# Patient Record
Sex: Female | Born: 1955 | Race: Black or African American | Hispanic: No | Marital: Single | Smoking: Never smoker
Health system: Southern US, Community
[De-identification: ages and names within clinical notes are randomized; demographics above are authoritative.]

## PROBLEM LIST (undated history)

## (undated) DIAGNOSIS — Q752 Hypertelorism: Secondary | ICD-10-CM

## (undated) DIAGNOSIS — M1990 Unspecified osteoarthritis, unspecified site: Secondary | ICD-10-CM

## (undated) DIAGNOSIS — E785 Hyperlipidemia, unspecified: Secondary | ICD-10-CM

## (undated) DIAGNOSIS — D72829 Elevated white blood cell count, unspecified: Secondary | ICD-10-CM

## (undated) DIAGNOSIS — J40 Bronchitis, not specified as acute or chronic: Secondary | ICD-10-CM

## (undated) DIAGNOSIS — Z87898 Personal history of other specified conditions: Secondary | ICD-10-CM

## (undated) DIAGNOSIS — I10 Essential (primary) hypertension: Secondary | ICD-10-CM

## (undated) DIAGNOSIS — E119 Type 2 diabetes mellitus without complications: Secondary | ICD-10-CM

## (undated) DIAGNOSIS — E669 Obesity, unspecified: Secondary | ICD-10-CM

## (undated) DIAGNOSIS — R87629 Unspecified abnormal cytological findings in specimens from vagina: Secondary | ICD-10-CM

## (undated) DIAGNOSIS — E559 Vitamin D deficiency, unspecified: Secondary | ICD-10-CM

## (undated) DIAGNOSIS — G4730 Sleep apnea, unspecified: Secondary | ICD-10-CM

## (undated) DIAGNOSIS — R7303 Prediabetes: Secondary | ICD-10-CM

## (undated) DIAGNOSIS — J42 Unspecified chronic bronchitis: Secondary | ICD-10-CM

## (undated) HISTORY — DX: Unspecified osteoarthritis, unspecified site: M19.90

## (undated) HISTORY — DX: Vitamin D deficiency, unspecified: E55.9

## (undated) HISTORY — PX: LAPAROSCOPIC GASTRIC SLEEVE RESECTION: SHX5895

## (undated) HISTORY — DX: Other disorders of bilirubin metabolism: E80.6

## (undated) HISTORY — DX: Hyperlipidemia, unspecified: E78.5

## (undated) HISTORY — DX: Type 2 diabetes mellitus without complications: E11.9

## (undated) HISTORY — DX: Unspecified abnormal cytological findings in specimens from vagina: R87.629

## (undated) HISTORY — DX: Elevated white blood cell count, unspecified: D72.829

## (undated) HISTORY — DX: Hypertelorism: Q75.2

## (undated) HISTORY — DX: Personal history of other specified conditions: Z87.898

## (undated) HISTORY — DX: Essential (primary) hypertension: I10

## (undated) HISTORY — DX: Obesity, unspecified: E66.9

## (undated) HISTORY — PX: OTHER SURGICAL HISTORY: SHX169

---

## 1998-06-19 ENCOUNTER — Ambulatory Visit (HOSPITAL_COMMUNITY): Admission: RE | Admit: 1998-06-19 | Discharge: 1998-06-19 | Payer: Self-pay | Admitting: Surgery

## 1998-06-19 ENCOUNTER — Encounter: Payer: Self-pay | Admitting: Surgery

## 2002-02-09 ENCOUNTER — Other Ambulatory Visit: Admission: RE | Admit: 2002-02-09 | Discharge: 2002-02-09 | Payer: Self-pay | Admitting: Family Medicine

## 2003-02-26 ENCOUNTER — Other Ambulatory Visit: Admission: RE | Admit: 2003-02-26 | Discharge: 2003-02-26 | Payer: Self-pay | Admitting: Family Medicine

## 2003-10-11 ENCOUNTER — Other Ambulatory Visit: Admission: RE | Admit: 2003-10-11 | Discharge: 2003-10-11 | Payer: Self-pay | Admitting: Family Medicine

## 2003-11-30 ENCOUNTER — Emergency Department (HOSPITAL_COMMUNITY): Admission: EM | Admit: 2003-11-30 | Discharge: 2003-11-30 | Payer: Self-pay | Admitting: Family Medicine

## 2003-12-03 ENCOUNTER — Emergency Department (HOSPITAL_COMMUNITY): Admission: EM | Admit: 2003-12-03 | Discharge: 2003-12-03 | Payer: Self-pay | Admitting: *Deleted

## 2004-02-10 ENCOUNTER — Emergency Department (HOSPITAL_COMMUNITY): Admission: EM | Admit: 2004-02-10 | Discharge: 2004-02-10 | Payer: Self-pay | Admitting: Emergency Medicine

## 2007-01-08 ENCOUNTER — Encounter: Admission: RE | Admit: 2007-01-08 | Discharge: 2007-01-08 | Payer: Self-pay | Admitting: Orthopedic Surgery

## 2007-04-17 HISTORY — PX: KNEE ARTHROSCOPY: SHX127

## 2007-04-21 ENCOUNTER — Ambulatory Visit (HOSPITAL_BASED_OUTPATIENT_CLINIC_OR_DEPARTMENT_OTHER): Admission: RE | Admit: 2007-04-21 | Discharge: 2007-04-21 | Payer: Self-pay | Admitting: Orthopedic Surgery

## 2008-06-27 ENCOUNTER — Encounter (INDEPENDENT_AMBULATORY_CARE_PROVIDER_SITE_OTHER): Payer: Self-pay | Admitting: *Deleted

## 2008-07-03 ENCOUNTER — Ambulatory Visit: Payer: Self-pay | Admitting: *Deleted

## 2008-07-03 DIAGNOSIS — N259 Disorder resulting from impaired renal tubular function, unspecified: Secondary | ICD-10-CM | POA: Insufficient documentation

## 2008-07-03 DIAGNOSIS — R319 Hematuria, unspecified: Secondary | ICD-10-CM | POA: Insufficient documentation

## 2008-07-03 DIAGNOSIS — E785 Hyperlipidemia, unspecified: Secondary | ICD-10-CM | POA: Insufficient documentation

## 2008-07-03 DIAGNOSIS — R079 Chest pain, unspecified: Secondary | ICD-10-CM | POA: Insufficient documentation

## 2008-07-03 DIAGNOSIS — I10 Essential (primary) hypertension: Secondary | ICD-10-CM | POA: Insufficient documentation

## 2008-07-03 DIAGNOSIS — R799 Abnormal finding of blood chemistry, unspecified: Secondary | ICD-10-CM | POA: Insufficient documentation

## 2008-07-03 DIAGNOSIS — M1990 Unspecified osteoarthritis, unspecified site: Secondary | ICD-10-CM | POA: Insufficient documentation

## 2008-07-03 DIAGNOSIS — R17 Unspecified jaundice: Secondary | ICD-10-CM | POA: Insufficient documentation

## 2008-07-03 DIAGNOSIS — D72829 Elevated white blood cell count, unspecified: Secondary | ICD-10-CM | POA: Insufficient documentation

## 2008-07-31 ENCOUNTER — Encounter: Payer: Self-pay | Admitting: Cardiology

## 2008-07-31 ENCOUNTER — Ambulatory Visit: Payer: Self-pay | Admitting: Diagnostic Radiology

## 2008-07-31 ENCOUNTER — Ambulatory Visit (HOSPITAL_BASED_OUTPATIENT_CLINIC_OR_DEPARTMENT_OTHER): Admission: RE | Admit: 2008-07-31 | Discharge: 2008-07-31 | Payer: Self-pay | Admitting: *Deleted

## 2008-07-31 ENCOUNTER — Other Ambulatory Visit: Admission: RE | Admit: 2008-07-31 | Discharge: 2008-07-31 | Payer: Self-pay | Admitting: *Deleted

## 2008-07-31 ENCOUNTER — Ambulatory Visit: Payer: Self-pay | Admitting: *Deleted

## 2008-07-31 ENCOUNTER — Encounter (INDEPENDENT_AMBULATORY_CARE_PROVIDER_SITE_OTHER): Payer: Self-pay | Admitting: *Deleted

## 2008-07-31 ENCOUNTER — Ambulatory Visit: Payer: Self-pay | Admitting: Cardiology

## 2008-07-31 DIAGNOSIS — N3000 Acute cystitis without hematuria: Secondary | ICD-10-CM | POA: Insufficient documentation

## 2008-07-31 LAB — CONVERTED CEMR LAB
ALT: 19 units/L (ref 0–35)
AST: 25 units/L (ref 0–37)
Basophils Absolute: 0.1 10*3/uL (ref 0.0–0.1)
CO2: 28 meq/L (ref 19–32)
Calcium: 8.5 mg/dL (ref 8.4–10.5)
Chloride: 107 meq/L (ref 96–112)
Eosinophils Absolute: 0.1 10*3/uL (ref 0.0–0.7)
GFR calc non Af Amer: 135.64 mL/min (ref >60)
HCT: 38.3 % (ref 36.0–46.0)
Hemoglobin: 12.7 g/dL (ref 12.0–15.0)
LDL Cholesterol: 135 mg/dL — ABNORMAL HIGH (ref 0–99)
Lymphs Abs: 3.4 10*3/uL (ref 0.7–4.0)
MCHC: 33.3 g/dL (ref 30.0–36.0)
Monocytes Relative: 8 % (ref 3.0–12.0)
Neutro Abs: 5 10*3/uL (ref 1.4–7.7)
Platelets: 293 10*3/uL (ref 150.0–400.0)
RDW: 14.4 % (ref 11.5–14.6)
Sodium: 140 meq/L (ref 135–145)
TSH: 1.14 microintl units/mL (ref 0.35–5.50)
Total CHOL/HDL Ratio: 4
Total Protein: 6.8 g/dL (ref 6.0–8.3)

## 2008-08-15 ENCOUNTER — Encounter: Payer: Self-pay | Admitting: Cardiology

## 2008-08-15 ENCOUNTER — Ambulatory Visit: Payer: Self-pay

## 2008-08-20 ENCOUNTER — Encounter: Payer: Self-pay | Admitting: Internal Medicine

## 2008-08-29 ENCOUNTER — Telehealth: Payer: Self-pay | Admitting: Cardiology

## 2008-09-05 ENCOUNTER — Telehealth: Payer: Self-pay | Admitting: Internal Medicine

## 2008-09-17 ENCOUNTER — Ambulatory Visit: Payer: Self-pay | Admitting: Internal Medicine

## 2008-10-15 HISTORY — PX: TOTAL KNEE ARTHROPLASTY: SHX125

## 2008-10-25 ENCOUNTER — Inpatient Hospital Stay (HOSPITAL_COMMUNITY): Admission: RE | Admit: 2008-10-25 | Discharge: 2008-10-29 | Payer: Self-pay | Admitting: Orthopedic Surgery

## 2009-05-15 ENCOUNTER — Ambulatory Visit: Payer: Self-pay | Admitting: Internal Medicine

## 2009-05-15 LAB — CONVERTED CEMR LAB
ALT: 13 units/L (ref 0–35)
AST: 17 units/L (ref 0–37)
Albumin: 3.9 g/dL (ref 3.5–5.2)
Alkaline Phosphatase: 72 units/L (ref 39–117)
BUN: 12 mg/dL (ref 6–23)
Calcium: 9.2 mg/dL (ref 8.4–10.5)
Chloride: 105 meq/L (ref 96–112)
Cholesterol: 201 mg/dL — ABNORMAL HIGH (ref 0–200)
Creatinine, Ser: 0.8 mg/dL (ref 0.40–1.20)
HDL: 46 mg/dL (ref >39)
Indirect Bilirubin: 0.2 mg/dL (ref 0.0–0.9)
Total Protein: 7.1 g/dL (ref 6.0–8.3)
Triglycerides: 116 mg/dL (ref <150)

## 2009-05-20 ENCOUNTER — Encounter (INDEPENDENT_AMBULATORY_CARE_PROVIDER_SITE_OTHER): Payer: Self-pay | Admitting: *Deleted

## 2009-05-23 ENCOUNTER — Encounter: Payer: Self-pay | Admitting: Internal Medicine

## 2009-05-23 ENCOUNTER — Encounter (INDEPENDENT_AMBULATORY_CARE_PROVIDER_SITE_OTHER): Payer: Self-pay | Admitting: *Deleted

## 2009-05-26 ENCOUNTER — Ambulatory Visit: Payer: Self-pay | Admitting: Gastroenterology

## 2009-06-05 ENCOUNTER — Telehealth: Payer: Self-pay | Admitting: Gastroenterology

## 2009-06-19 ENCOUNTER — Ambulatory Visit: Payer: Self-pay | Admitting: Internal Medicine

## 2009-06-19 DIAGNOSIS — J069 Acute upper respiratory infection, unspecified: Secondary | ICD-10-CM | POA: Insufficient documentation

## 2009-06-19 DIAGNOSIS — R7309 Other abnormal glucose: Secondary | ICD-10-CM | POA: Insufficient documentation

## 2009-07-02 ENCOUNTER — Ambulatory Visit: Payer: Self-pay | Admitting: Gastroenterology

## 2009-07-07 ENCOUNTER — Encounter: Payer: Self-pay | Admitting: Gastroenterology

## 2009-07-07 LAB — HM COLONOSCOPY

## 2009-07-27 ENCOUNTER — Emergency Department (HOSPITAL_BASED_OUTPATIENT_CLINIC_OR_DEPARTMENT_OTHER): Admission: EM | Admit: 2009-07-27 | Discharge: 2009-07-27 | Payer: Self-pay | Admitting: Emergency Medicine

## 2009-07-27 ENCOUNTER — Ambulatory Visit: Payer: Self-pay | Admitting: Diagnostic Radiology

## 2009-08-01 ENCOUNTER — Ambulatory Visit: Payer: Self-pay | Admitting: Internal Medicine

## 2009-08-01 DIAGNOSIS — M545 Low back pain, unspecified: Secondary | ICD-10-CM | POA: Insufficient documentation

## 2009-08-01 LAB — CONVERTED CEMR LAB
Bilirubin Urine: NEGATIVE
Glucose, Urine, Semiquant: NEGATIVE
Ketones, urine, test strip: NEGATIVE
Nitrite: NEGATIVE
Urobilinogen, UA: 0.2
pH: 5

## 2009-08-02 ENCOUNTER — Encounter: Payer: Self-pay | Admitting: Internal Medicine

## 2009-08-06 ENCOUNTER — Encounter: Payer: Self-pay | Admitting: Internal Medicine

## 2009-08-15 ENCOUNTER — Ambulatory Visit: Payer: Self-pay | Admitting: Internal Medicine

## 2009-08-15 LAB — CONVERTED CEMR LAB
Specific Gravity, Urine: 1.02
Urobilinogen, UA: 0.2
pH: 6.5

## 2009-08-25 ENCOUNTER — Encounter: Payer: Self-pay | Admitting: Internal Medicine

## 2009-08-26 ENCOUNTER — Ambulatory Visit: Payer: Self-pay | Admitting: Diagnostic Radiology

## 2009-08-26 ENCOUNTER — Ambulatory Visit (HOSPITAL_BASED_OUTPATIENT_CLINIC_OR_DEPARTMENT_OTHER): Admission: RE | Admit: 2009-08-26 | Discharge: 2009-08-26 | Payer: Self-pay | Admitting: Internal Medicine

## 2009-08-26 ENCOUNTER — Encounter: Payer: Self-pay | Admitting: Internal Medicine

## 2009-09-09 ENCOUNTER — Encounter: Payer: Self-pay | Admitting: Internal Medicine

## 2009-09-16 ENCOUNTER — Telehealth: Payer: Self-pay | Admitting: Internal Medicine

## 2009-10-07 ENCOUNTER — Encounter: Payer: Self-pay | Admitting: Internal Medicine

## 2009-10-09 ENCOUNTER — Encounter: Payer: Self-pay | Admitting: Internal Medicine

## 2010-04-07 ENCOUNTER — Ambulatory Visit: Payer: Self-pay | Admitting: Internal Medicine

## 2010-04-07 DIAGNOSIS — R51 Headache: Secondary | ICD-10-CM | POA: Insufficient documentation

## 2010-04-07 DIAGNOSIS — R519 Headache, unspecified: Secondary | ICD-10-CM | POA: Insufficient documentation

## 2010-04-07 LAB — CONVERTED CEMR LAB
BUN: 8 mg/dL
CO2: 27 meq/L
CRP, High Sensitivity: 8.2 — ABNORMAL HIGH
Calcium: 9.3 mg/dL
Chloride: 105 meq/L
Cholesterol: 208 mg/dL — ABNORMAL HIGH
Creatinine, Ser: 0.67 mg/dL
Glucose, Bld: 91 mg/dL
HDL: 50 mg/dL
Hgb A1c MFr Bld: 5.9 % — ABNORMAL HIGH
LDL Cholesterol: 139 mg/dL — ABNORMAL HIGH
Potassium: 4 meq/L
Sed Rate: 17 mm/h
Sodium: 141 meq/L
Total CHOL/HDL Ratio: 4.2
Triglycerides: 97 mg/dL
VLDL: 19 mg/dL

## 2010-04-13 ENCOUNTER — Telehealth: Payer: Self-pay | Admitting: Internal Medicine

## 2010-04-13 ENCOUNTER — Encounter: Payer: Self-pay | Admitting: Internal Medicine

## 2010-04-15 ENCOUNTER — Telehealth: Payer: Self-pay | Admitting: Internal Medicine

## 2010-05-05 ENCOUNTER — Ambulatory Visit: Payer: Self-pay | Admitting: Internal Medicine

## 2010-05-08 ENCOUNTER — Encounter: Payer: Self-pay | Admitting: Internal Medicine

## 2010-05-08 DIAGNOSIS — L538 Other specified erythematous conditions: Secondary | ICD-10-CM | POA: Insufficient documentation

## 2010-05-17 DIAGNOSIS — G4730 Sleep apnea, unspecified: Secondary | ICD-10-CM

## 2010-05-17 HISTORY — DX: Sleep apnea, unspecified: G47.30

## 2010-06-11 ENCOUNTER — Telehealth: Payer: Self-pay | Admitting: Internal Medicine

## 2010-06-16 NOTE — Progress Notes (Signed)
Summary: procedure cx charge?  Phone Note Call from Patient Call back at Home Phone [phone]   Caller: Patient Call For: Dr. Christella Hartigan Reason for Call: Talk to Doctor Summary of Call: pt called today at 1:00PM to reschedule her 06/09/2009 9:30AM COL... informed pt that she could be charged because she needed to have let us know before 5:00PM yesterday... pt said she didnt call us yesterday because she "had a lot of running around to do"... pt's reason for rescheduling is that she says she can't afford the prep right now... pt rescheduled for 07/02/2009 Dr. Christella Hartigan, would you like for this pt to be charged? Initial call taken by: Vallarie Mare,  June 05, 2009 1:14 PM  Follow-up for Phone Call        do not charge since she rescheduled. Follow-up by: Rachael Fee MD,  June 05, 2009 1:22 PM  Additional Follow-up for Phone Call Additional follow up Details #1::        Patient NOT BILLED. Additional Follow-up by: Leanor Kail [facility],  June 09, 2009 9:45 AM

## 2010-06-16 NOTE — Progress Notes (Signed)
Summary: Medication Problems  Phone Note Call from Patient Call back at Home Phone [phone] Call back at Work Phone [phone]   Caller: Patient Call For: D. Thomos Lemons DO Summary of Call: patient called and left voice message stating she has been sick, and not sure if it is the combination of the Metformin and the  Percocet that she is taking for her leg pain.  call returned to patient she took Percocet last night. She states she has had nausea and vomiting since starting the metformin. Initial call taken by: Glendell Docker CMA,  Sep 16, 2009 12:02 PM  Follow-up for Phone Call        hold metformin Follow-up by: D. Thomos Lemons DO,  Sep 16, 2009 1:25 PM  Additional Follow-up for Phone Call Additional follow up Details #1::        patient advised per Dr Artist Pais instructions Additional Follow-up by: Glendell Docker CMA,  Sep 16, 2009 2:36 PM    New/Updated Medications: METFORMIN HCL 500 MG TABS (METFORMIN HCL) 1/2 by mouth two times a day x  7 days, then one by mouth two times a day (hold) ASPIR-LOW 81 MG TBEC (ASPIRIN) Take 1 tablet by mouth once a day [employer] VIT D 5000 HIGH-POTENCY 5000 UNIT CAPS (CHOLECALCIFEROL) Take 1 capsule by mouth once a day PERCOCET 5-325 MG TABS (OXYCODONE-ACETAMINOPHEN) Take 1 tablet by mouth once a day as needed

## 2010-06-16 NOTE — Letter (Signed)
Summary: [facility] Instructions  [facility]  [address] [place], [place] [address]   Phone: [phone]  Fax: [phone]       Leah Hampton    [birth year]    MRN: [id number]        Procedure Day Dorna Bloom:  Duanne Limerick  06/09/09     Arrival Time:  8:30AM     Procedure Time:  9:30AM     Location of Procedure:                    Juliann Pares  [facility] (4th Floor)                        PREPARATION FOR COLONOSCOPY WITH MOVIPREP   Starting 5 days prior to your procedure 06/04/09 do not eat nuts, seeds, popcorn, corn, beans, peas,  salads, or any raw vegetables.  Do not take any fiber supplements (e.g. Metamucil, Citrucel, and Benefiber).  THE DAY BEFORE YOUR PROCEDURE         DATE: 06/08/09  DAY: SUNDAY  1.  Drink clear liquids the entire day-NO SOLID FOOD  2.  Do not drink anything colored red or purple.  Avoid juices with pulp.  No orange juice.  3.  Drink at least 64 oz. (8 glasses) of fluid/clear liquids during the day to prevent dehydration and help the prep work efficiently.  CLEAR LIQUIDS INCLUDE: Water Jello Ice Popsicles Tea (sugar ok, no milk/cream) Powdered fruit flavored drinks Coffee (sugar ok, no milk/cream) Gatorade Juice: apple, white grape, white cranberry  Lemonade Clear bullion, consomm, broth Carbonated beverages (any kind) Strained chicken noodle soup Hard Candy                             4.  In the morning, mix first dose of MoviPrep solution:    Empty 1 Pouch A and 1 Pouch B into the disposable container    Add lukewarm drinking water to the top line of the container. Mix to dissolve    Refrigerate (mixed solution should be used within 24 hrs)  5.  Begin drinking the prep at 5:00 p.m. The MoviPrep container is divided by 4 marks.   Every 15 minutes drink the solution down to the next mark (approximately 8 oz) until the full liter is complete.   6.  Follow completed prep with 16 oz of clear liquid of your choice (Nothing  red or purple).  Continue to drink clear liquids until bedtime.  7.  Before going to bed, mix second dose of MoviPrep solution:    Empty 1 Pouch A and 1 Pouch B into the disposable container    Add lukewarm drinking water to the top line of the container. Mix to dissolve    Refrigerate  THE DAY OF YOUR PROCEDURE      DATE: 06/09/09 DAY: MONDAY  Beginning at 4:30AM (5 hours before procedure):         1. Every 15 minutes, drink the solution down to the next mark (approx 8 oz) until the full liter is complete.  2. Follow completed prep with 16 oz. of clear liquid of your choice.    3. You may drink clear liquids until 7:30AM (2 HOURS BEFORE PROCEDURE).   MEDICATION INSTRUCTIONS  Unless otherwise instructed, you should take regular prescription medications with a small sip of water   as early as possible the morning of  your procedure.       OTHER INSTRUCTIONS  You will need a responsible adult at least 53 years of age to accompany you and drive you home.   This person must remain in the waiting room during your procedure.  Wear loose fitting clothing that is easily removed.  Leave jewelry and other valuables at home.  However, you may wish to bring a book to read or  an iPod/MP3 player to listen to music as you wait for your procedure to start.  Remove all body piercing jewelry and leave at home.  Total time from sign-in until discharge is approximately 2-3 hours.  You should go home directly after your procedure and rest.  You can resume normal activities the  day after your procedure.  The day of your procedure you should not:   Drive   Make legal decisions   Operate machinery   Drink alcohol   Return to work  You will receive specific instructions about eating, activities and medications before you leave.    The above instructions have been reviewed and explained to me by  Ezra Sites RN  May 26, 2009 1:28 PM      I fully understand and can  verbalize these instructions _____________________________ Date _________

## 2010-06-16 NOTE — Letter (Signed)
Summary: Primary Care Consult Scheduled Letter  [name]  [address]   [place], [place] [address]   Phone: [phone]  Fax: [phone]      05/20/2009 MRN: [id number]  Leah Hampton [address] [place], [place]  [address]    Dear [name],      We have scheduled an appointment for you.  At the recommendation of Dr._Yoo_____________________, we have scheduled you a consult with __Dr Jacobs__________________ on __Jan 10________________ at [facility] previsit and [facility] At 9:30 for procedure _____.  Their address is_[address] third floor ________________________. The office phone number is __[phone]__________.  If this appointment day and time is not convenient for you, please feel free to call the office of the doctor you are being referred to at the number listed above and reschedule the appointment.     It is important for you to keep your scheduled appointments. We are here to make sure you are given good patient care. If you have questions or you have made changes to your appointment, please notify us at  [phone]         , ask for              .    Thank you,  Patient Care Coordinator [place] [facility]

## 2010-06-16 NOTE — Progress Notes (Signed)
Summary: Unresolved Rash   Phone Note Call from Patient Call back at [phone]   Summary of Call: patient called and left voice message stating her rash under her breast has not improved. She has tried the Lamisil and Cortisone cream with no changes. She would like to know if Dr Artist Pais would provide her a rx cream or powder to use in order to get some relief. Initial call taken by: Glendell Docker CMA,  April 15, 2010 3:14 PM  Follow-up for Phone Call        call was returned to patient she was informed Dr Artist Pais was out of the office this afternoon. She states that she has tried the Lamisil Powder and the Cortisone Cream and there has not been any improvement. She was advised that she would receive a return call Thursday with Dr Artist Pais recommendations. Patient verbalized understanding and agrees to wait on a return call.  Follow-up by: Glendell Docker CMA,  April 15, 2010 3:22 PM  Additional Follow-up for Phone Call Additional follow up Details #1::        see rx for fluconoazole if no improvement by week 2 - needs OV Additional Follow-up by: D. Thomos Lemons DO,  April 16, 2010 2:38 PM    Additional Follow-up for Phone Call Additional follow up Details #2::    call returned to patient at [phone], she has been advised per Dr Artist Pais instructions Follow-up by: Glendell Docker CMA,  April 16, 2010 2:43 PM  New/Updated Medications: FLUCONAZOLE 50 MG TABS (FLUCONAZOLE) one by mouth once daily Prescriptions: FLUCONAZOLE 50 MG TABS (FLUCONAZOLE) one by mouth once daily  #7 x 0   Entered and Authorized by:   D. Thomos Lemons DO   Signed by:   D. Thomos Lemons DO on 04/16/2010   Method used:   Electronically to        [facility] [phone]* (retail)       [address]       [place], [place]  [address]       Ph: [phone]       Fax: [phone]   RxID:   [id number]

## 2010-06-16 NOTE — Letter (Signed)
Summary: Previsit letter  [facility]  [address] [place], [place] [address]   Phone: [phone]  Fax: [phone]       05/20/2007 MRN: [id number]  Leah Hampton [address] [place], [place]  [address]  Dear [name],  Welcome to the Gastroenterology Division at [facility].    You are scheduled to see a nurse for your pre-procedure visit on  Jan. 10, 2011 at 11am on the 3rd floor at [facility], [address]  We ask that you try to arrive at our office 15 minutes prior to your appointment time to allow for check-in.  Your nurse visit will consist of discussing your medical and surgical history, your immediate family medical history, and your medications.    Please bring a complete list of all your medications or, if you prefer, bring the medication bottles and we will list them.  We will need to be aware of both prescribed and over the counter drugs.  We will need to know exact dosage information as well.  If you are on blood thinners (Coumadin, Plavix, Aggrenox, Ticlid, etc.) please call our office today/prior to your appointment, as we need to consult with your physician about holding your medication.   Please be prepared to read and sign documents such as consent forms, a financial agreement, and acknowledgement forms.  If necessary, and with your consent, a friend or relative is welcome to sit-in on the nurse visit with you.  Please bring your insurance card so that we may make a copy of it.  If your insurance requires a referral to see a specialist, please bring your referral form from your primary care physician.  No co-pay is required for this nurse visit.     If you cannot keep your appointment, please call [phone] to cancel or reschedule prior to your appointment date.  This allows [place] the opportunity to schedule an appointment for another patient in need of care.    Thank you for choosing [facility] for your medical  needs.  We appreciate the opportunity to care for you.  Please visit [place] at our website  to learn more about our practice.                     Sincerely.                                                                                                                   The Gastroenterology Division

## 2010-06-16 NOTE — Assessment & Plan Note (Signed)
Summary: sore throat/mhf   Vital Signs:  Patient profile:   55 year old female Menstrual status:  perimenopausal Weight:      268.75 pounds BMI:     48.55 O2 Sat:      99 % on Room air Temp:     98.3 degrees F oral Pulse rate:   88 / minute Pulse rhythm:   regular Resp:     18 per minute BP sitting:   124 / 70  (right arm) Cuff size:   large  Vitals Entered By: Glendell Docker CMA (June 19, 2009 1:18 PM)  O2 Flow:  Room air  Primary Care Provider:  D. Thomos Lemons DO  CC:  Sore Throat.  History of Present Illness: c/o sore throat since Monday, Theraflu with little relief.    no fever. neck is not sore. non productive cough. no sob  Reviewed prev blood work - A1c is elevated at 6.3  Allergies: 1)  ! 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 Irine Seal    Family History: Family History of Arthritis Family History Diabetes 1st degree relative Family History of  stroke Family History of  CAD 2 sisters with congestive heart failure and mother with congestive heart failure. Father had a myocardial infarction in his 45s.     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     Mouth:  no exudates and postnasal drip.   Neck:  No deformities, masses, or tenderness noted. Lungs:  normal respiratory effort, normal breath sounds, and no wheezes.   Heart:  normal rate, regular rhythm, and no gallop.     Impression & Recommendations:  Problem # 1:  URI (ICD-465.9)  Instructed on symptomatic treatment. Call if symptoms persist or worsen.   Problem # 2:  DIABETES MELLITUS, TYPE II, BORDERLINE (ICD-790.29) A1c elevated at 6.3.  Reviewed lifestyle changes.  limit carb intake to 30 grams per meal.  repeat A1c at next visit.  consider start metformin  Complete Medication List: 1)  Percocet 5-325 Mg Tabs (Oxycodone-acetaminophen) .Marland Kitchen.. 1-2 q 4-6 hours as needed for pain 2)  Moviprep 100 Gm Solr (Peg-kcl-nacl-nasulf-na asc-c) .... As per prep instructions.  Patient Instructions: 1)  Call our office if your symptoms do not  improve or gets worse.   Immunization History:  Influenza Immunization History:    Influenza:  declined (06/19/2009)   Contraindications/Deferment of Procedures/Staging:    Test/Procedure: FLU VAX    Reason for deferment: patient declined     Current Allergies (reviewed today): ! CODEINE

## 2010-06-16 NOTE — Progress Notes (Signed)
Summary: Lab Results  Phone Note Outgoing Call   Summary of Call: call pt - cholesterol levels too high.  I suggest pt start cholesterol medication.  see rx Initial call taken by: D. Thomos Lemons DO,  April 13, 2010 12:54 PM  Follow-up for Phone Call        call placed to patient at [phone],  she has been advised per Dr Artist Pais instructions Follow-up by: Glendell Docker CMA,  April 13, 2010 1:31 PM    New/Updated Medications: LOVASTATIN 20 MG TABS (LOVASTATIN) one by mouth qpm Prescriptions: LOVASTATIN 20 MG TABS (LOVASTATIN) one by mouth qpm  #30 x 2   Entered and Authorized by:   D. Thomos Lemons DO   Signed by:   D. Thomos Lemons DO on 04/13/2010   Method used:   Electronically to        [facility] [phone]* (retail)       [address]       [place], [place]  [address]       Ph: [phone]       Fax: [phone]   RxID:   [id number]

## 2010-06-16 NOTE — Miscellaneous (Signed)
Summary: [facility]  Clinical Lists Changes  Medications: Added new medication of MOVIPREP 100 GM  SOLR (PEG-KCL-NACL-NASULF-NA ASC-C) As per prep instructions. - Signed Rx of MOVIPREP 100 GM  SOLR (PEG-KCL-NACL-NASULF-NA ASC-C) As per prep instructions.;  #1 x 0;  Signed;  Entered by: Ezra Sites RN;  Authorized by: Rachael Fee MD;  Method used: Electronically to [facility] [phone]*, [address], [place], [place]  [address], Ph: [phone], Fax: [phone] Allergies: Changed allergy or adverse reaction from CODEINE to CODEINE    Prescriptions: MOVIPREP 100 GM  SOLR (PEG-KCL-NACL-NASULF-NA ASC-C) As per prep instructions.  #1 x 0   Entered by:   Ezra Sites RN   Authorized by:   Rachael Fee MD   Signed by:   Ezra Sites RN on 05/26/2009   Method used:   Electronically to        [facility] [phone]* (retail)       [address]       [place], [place]  [address]       Ph: [phone]       Fax: [phone]   RxID:   [id number]

## 2010-06-16 NOTE — Letter (Signed)
Summary: [facility]  [facility]   Imported By: Lanelle Bal 09/16/2009 13:35:01  _____________________________________________________________________  External Attachment:    Type:   Image     Comment:   External Document

## 2010-06-16 NOTE — Letter (Signed)
Summary: [facility]  [facility]   Imported By: Lanelle Bal 08/28/2009 12:59:18  _____________________________________________________________________  External Attachment:    Type:   Image     Comment:   External Document

## 2010-06-16 NOTE — Letter (Signed)
Summary: Results Letter  [facility]  [address] [place], [place] [address]   Phone: [phone]  Fax: [phone]        July 07, 2009 MRN: [id number]    Leah Hampton [address] [place], [place]  [address]    Dear Ms. TARAZON,   The polyp(s) removed during your recent procedure were proven to be adenomatous.  These are pre-cancerous polyps that may have grown into cancers if they had not been removed.  Based on current nationally recognized surveillance guidelines, I recommend that you have a repeat colonoscopy in 3 years.   We will therefore put your information in our reminder system and will contact you in 3 years to schedule a repeat procedure.  Please call if you have any questions or concerns.       Sincerely,  Rachael Fee MD  This letter has been electronically signed by your physician.  Appended Document: Results Letter Letter mailed 2.22.11.

## 2010-06-16 NOTE — Letter (Signed)
   [place] [facility] [address] [place], [place]  [address]  [place] Phone: [phone]      May 23, 2009   Leah Hampton [address] [place], [place] [address]  RE:  LAB RESULTS  Dear  [name],  The following is an interpretation of your most recent lab tests.  Please take note of any instructions provided or changes to medications that have resulted from your lab work.  ELECTROLYTES:  Good - no changes needed  KIDNEY FUNCTION TESTS:  Good - no changes needed  LIVER FUNCTION TESTS:  Good - no changes needed  LIPID PANEL:  Fair - review at your next visit Triglyceride: 116   Cholesterol: 201   LDL: 132   HDL: 46   Chol/HDL%:  4.4 Ratio  THYROID STUDIES:  Thyroid studies normal TSH: 2.461     DIABETIC STUDIES:  Fair - schedule a follow-up appointment Blood Glucose: 95   HgbA1C: 6.3       Your blood work shows you are a borderline diabetic.  I suggest you schedule a follow up appointment within 2-4 weeks.   Sincerely Yours,    Dr. Thomos Lemons

## 2010-06-16 NOTE — Assessment & Plan Note (Signed)
Summary: 2 WEEK FOLLOW UP/MHF   Vital Signs:  Patient profile:   55 year old female Menstrual status:  perimenopausal Height:      62.5 inches Weight:      267.25 pounds BMI:     48.28 O2 Sat:      98 % on Room air Temp:     98.3 degrees F oral Pulse rate:   96 / minute Pulse rhythm:   regular Resp:     20 per minute BP sitting:   126 / 70  (right arm) Cuff size:   large  Vitals Entered By: Glendell Docker CMA (August 15, 2009 3:48 PM)  O2 Flow:  Room air CC: Rm 2 - 2 Week Follow up  Comments c/o unresolved back pain, but it has improved   Primary Care Provider:  DThomos Lemons DO  CC:  Rm 2 - 2 Week Follow up .  History of Present Illness: 55 y/o  female for follow up low back pain improved but not resolved she did not proceed with CT of abd and pelvis due to cost reasons f/u u/a shows persistent microhematuria  borderline DM  II - having difficulty with diet compliance no able to exercise regularly    Allergies: 1)  ! 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 Irine Seal     Family History: Family History of Arthritis Family History Diabetes 1st degree relative Family History of  stroke Family History of  CAD 2 sisters with congestive heart failure and mother with congestive heart failure. Father had a myocardial infarction in his 80s.         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   Neck:  No deformities, masses, or tenderness noted. Lungs:  normal respiratory effort and normal breath sounds.   Heart:  normal rate, regular rhythm, and no gallop.   Abdomen:  obese, soft, non-tender, and no masses.   Extremities:  no pitting edema   Impression & Recommendations:  Problem # 1:  BACK PAIN, LUMBAR (ICD-724.2) Assessment Improved Left sided back pain is better but not resolved.  I doubt kidney stone.   I suspect lumbar strain.    Refer to PT   Her updated medication list for this problem includes:    Metaxalone 800 Mg Tabs (Metaxalone) ..... One by mouth three times a day as needed for left back pain    Tramadol Hcl 50 Mg Tabs (Tramadol hcl) ..... One by mouth two times a day prn  Problem # 2:  DIABETES MELLITUS, TYPE II, BORDERLINE (ICD-790.29)  Her updated medication list for this problem includes:    Metformin Hcl 500 Mg Tabs (Metformin hcl) .Marland Kitchen... 1/2 by mouth two times a day x  7 days, then one by mouth two times a day  Problem # 3:  HEMATURIA UNSPECIFIED (ICD-599.70)  Pt with persistent microhematuria.  refer  to urology for furthe work up  Orders: UA Dipstick w/o Micro (manual) ([address]) Urology Referral (Urology)  Complete Medication List: 1)  Metaxalone 800 Mg Tabs (Metaxalone) .... One by mouth three times a day as needed for left back pain 2)  Tramadol Hcl 50 Mg Tabs (Tramadol hcl) .... One by mouth two times a day prn 3)  Metformin Hcl 500 Mg Tabs (Metformin hcl) .... 1/2 by mouth two times a day x  7 days, then one by mouth two times a day  Patient Instructions: 1)  Please schedule a follow-up appointment in 3 months. 2)  BMP prior to visit, ICD-9:  790.29 3)  HbgA1C prior to visit, ICD-9:  790.29 4)  Schedule blood work 2-3  days before your next office visit 5)  Our office will contact you re:  referral to physical therapy Prescriptions: METFORMIN HCL 500 MG TABS (METFORMIN HCL) 1/2 by mouth two times a day x  7 days, then one by mouth two times a day  #60 x 3   Entered and Authorized by:   D. Thomos Lemons DO   Signed by:   D. Thomos Lemons DO on 08/15/2009   Method used:   Electronically to        [facility] [phone]* (retail)       [address]       [place], [place]  [address]       Ph: [phone]       Fax: [phone]   RxID:   [id number]   Current Allergies (reviewed today): ! CODEINE  Laboratory Results   Urine Tests    Routine Urinalysis   Color: yellow Appearance: Clear Glucose: negative   (Normal Range: Negative) Bilirubin: negative   (Normal Range: Negative) Ketone: negative   (Normal Range: Negative) Spec. Gravity: 1.020   (Normal Range: 1.003-1.035) Blood: trace-intact   (Normal Range: Negative) pH: 6.5   (Normal Range: 5.0-8.0) Protein: trace   (Normal Range: Negative) Urobilinogen: 0.2   (Normal Range: 0-1) Nitrite: negative   (Normal Range: Negative) Leukocyte Esterace: negative   (Normal Range: Negative)

## 2010-06-16 NOTE — Assessment & Plan Note (Signed)
Summary: hurts in back and on left side/mhf   Vital Signs:  Patient profile:   55 year old female Menstrual status:  perimenopausal Weight:      264.50 pounds BMI:     47.78 O2 Sat:      96 % on Room air Temp:     97.8 degrees F oral Pulse rate:   82 / minute Pulse rhythm:   regular Resp:     20 per minute BP sitting:   130 / 74  (right arm) Cuff size:   large  Vitals Entered By: Glendell Docker CMA (August 01, 2009 2:04 PM)  O2 Flow:  Room air CC: Rm 3-  Back and Side Pain Comments c/o right side and back pain since Sunday. She was seen in [facility] on Sunday, Had a Rx for Methacarbomol and took 2 with relief. She was seen last week Thursday at GYN and was found to have blood inher urine. They informed her she might have a kidney stone   Primary Care Provider:  D. Thomos Lemons DO  CC:  Rm 3-  Back and Side Pain.  History of Present Illness: 55 y/o AA female c/o left sided flank pain she fell at work 1 month ago at work pt described as  sharp,   intermittent worse with bending / movement no radiation severity 7 out of 10  seen by GYN - diagnosed with UTI, she had blood in her urine , rx'ed flagyl GYN questions possibility of kidney stone   Allergies: 1)  ! 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 Irine Seal    Family History: Family History of  Arthritis Family History Diabetes 1st degree relative Family History of  stroke Family History of  CAD 2 sisters with congestive heart failure and mother with congestive heart failure. Father had a myocardial infarction in his 48s.      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   Lungs:  normal respiratory effort and normal breath sounds.   Heart:  normal rate, regular rhythm, and no gallop.   Msk:  left lower lateral rib tenderness,  no vesicular rash Neurologic:  cranial nerves II-XII intact and gait normal.     Impression & Recommendations:  Problem # 1:  FLANK PAIN, LEFT (ICD-789.09) left flank pain of unclear etiology.  I suspect symptoms from musculoskeletal source.  rule out kidney stone.  check CT of abd and pelvis. use tramadol and metaxalone.  The following medications were removed from the medication list:    Percocet 5-325 Mg Tabs (Oxycodone-acetaminophen) .Marland Kitchen... 1-2 q 4-6 hours as needed for pain Her updated medication list for this problem includes:    Metaxalone 800 Mg Tabs (Metaxalone) ..... One by mouth three times a day as needed for left back pain    Tramadol Hcl 50 Mg Tabs (Tramadol hcl) ..... One by mouth two times a day prn  Orders: CT without Contrast (CT w/o contrast)  Complete Medication List: 1)  Metaxalone 800 Mg Tabs (Metaxalone) .... One by mouth three times a day as needed for left back pain 2)  Tramadol Hcl 50 Mg Tabs (Tramadol hcl) .... One by mouth two times a day prn  Other Orders: UA Dipstick w/o Micro (manual) (81191) T-Culture, Urine ([id number]) Specimen Handling ([address])  Patient Instructions: 1)  Please schedule a follow-up appointment in 2 weeks. 2)  Do not take tramadol and metaxalone together.  Staggger dosing 2-4 hrs to avoid excessive  sedation Prescriptions: TRAMADOL HCL 50 MG TABS (TRAMADOL HCL) one by mouth two times a day prn  #30 x 0   Entered and Authorized by:   D. Thomos Lemons DO   Signed by:   D. Thomos Lemons DO on 08/01/2009   Method used:   Electronically to        [facility] [phone]* (retail)       [address]       [place], [place]  [address]       Ph: [phone]       Fax: [phone]   RxID:   [id number] METAXALONE 800 MG TABS (METAXALONE) one by mouth three times a day as needed for left back pain  #21 x 0   Entered and Authorized by:   D. Thomos Lemons DO   Signed by:   D. Thomos Lemons DO on 08/01/2009   Method used:   Electronically to        [facility] [phone]* (retail)       [address]       [place], [place]  [address]       Ph: [phone]       Fax: [phone]   RxID:   [id number]   Current Allergies (reviewed today): ! CODEINE   Preventive Care Screening  Pap Smear:    Date:  07/24/2009    Results:  Done   Laboratory Results   Urine Tests    Routine Urinalysis   Color: straw Appearance: Clear Glucose: negative   (Normal Range: Negative) Bilirubin: negative   (Normal Range: Negative) Ketone: negative   (Normal Range: Negative) Spec. Gravity: 1.020   (Normal Range: 1.003-1.035) Blood: small   (Normal Range: Negative) pH: 5.0   (Normal Range: 5.0-8.0) Protein: 30   (Normal Range: Negative) Urobilinogen: 0.2   (Normal Range: 0-1) Nitrite: negative   (Normal Range: Negative) Leukocyte Esterace: negative   (Normal Range: Negative)

## 2010-06-16 NOTE — Assessment & Plan Note (Signed)
Summary: 2 month follow up/mhf   Vital Signs:  Patient profile:   55 year old female Menstrual status:  perimenopausal Height:      62.5 inches Weight:      271.50 pounds BMI:     49.04 O2 Sat:      97 % on Room air Temp:     98.4 degrees F oral Pulse rate:   83 / minute Resp:     18 per minute BP sitting:   130 / 80  (right arm) Cuff size:   Thigh  Vitals Entered By: Glendell Docker CMA (April 07, 2010 2:54 PM)  O2 Flow:  Room air CC: 2 Month Follow up  Is Patient Diabetic? No Pain Assessment Patient in pain? no        Primary Care Provider:  Dondra Spry DO  CC:  2 Month Follow up .  History of Present Illness: 55 y/o female c/o constant steady HA bitemporal no nausea, no fever denies tightness no obvious trigger not like migraine sometimes wakes up with headache  rash under left breast, use of medictated power and cortisone cream ongoing for the past week,   microhematuria - seen by urology.  workup reported negative  Preventive Screening-Counseling & Management  Alcohol-Tobacco     Smoking Status: never  Allergies: 1)  ! Codeine  Past History:  Past Medical History: OBESITY (ICD-278.00) OTHER SCREENING BREAST EXAMINATION (ICD-V76.19)  ACUTE CYSTITIS (ICD-595.0) ENCOUNTER FOR LONG-TERM USE OF OTHER MEDICATIONS (ICD-V58.69) ROUTINE GYNECOLOGICAL EXAMINATION (ICD-V72.31) HYPERBILIRUBINEMIA (ICD-782.4) HX OF CHEST PAIN (ICD-786.50) FAMILY HISTORY DIABETES 1ST DEGREE RELATIVE (ICD-V18.0) ANA POSITIVE (ICD-790.99)  HEMATURIA UNSPECIFIED (ICD-599.70) - urologic eval Dr. 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   Eyes:  pupils equal, pupils round, and pupils reactive to light.   Mouth:  pharynx pink and moist.   Lungs:  normal respiratory effort and normal breath sounds.   Heart:  normal rate, regular rhythm, and no gallop.   Extremities:  no pitting edema Neurologic:  cranial nerves II-XII intact and gait normal.   Psych:  normally interactive, good eye contact, not anxious appearing, and not depressed appearing.     Impression & Recommendations:  Problem # 1:  HEADACHE (ICD-784.0) consider HA from OSA.  pt defers home sleep study test.  probable tension.  rule out arteritis trial of muscle relaxer at bedtime   The following medications were removed from the medication list:    Percocet 5-325 Mg Tabs (Oxycodone-acetaminophen) .Marland Kitchen... Take 1 tablet by mouth once a day as needed Her updated medication list for this problem includes:    Aspir-low 81 Mg Tbec (Aspirin) .Marland Kitchen... Take 1 tablet by mouth once a day  Orders: T-Sed Rate (Automated) ([id number])  Problem # 2:  DIABETES MELLITUS, TYPE II, BORDERLINE (ICD-790.29)  The following medications were removed from the medication list:    Metformin Hcl 500 Mg Tabs (Metformin hcl) .Marland Kitchen... 1/2 by mouth two times a day x  7 days, then one by  mouth two times a day (hold)  Orders: T-Basic Metabolic Panel [id number]) T-Lipid Profile [phone]) T- Hemoglobin A1C ([phone])  Complete Medication List: 1)  Aspir-low 81 Mg Tbec (Aspirin) .... Take 1 tablet by mouth once a day 2)  [facility] Vit D 5000 High-potency 5000 Unit Caps (Cholecalciferol) .... Take 1 capsule by mouth once a day 3)  Cyclobenzaprine Hcl 5 Mg Tabs (Cyclobenzaprine hcl) .... One by mouth at bedtime as needed 4)  Lovastatin 20 Mg Tabs (Lovastatin) .... One by mouth qpm 5)  Fluconazole 50 Mg Tabs (Fluconazole) .... One  by mouth once daily  Other Orders: CRP, high sensitivity-[facility] ([id number])  Patient Instructions: 1)  Please schedule a follow-up appointment in 1 month. Prescriptions: CYCLOBENZAPRINE HCL 5 MG TABS (CYCLOBENZAPRINE HCL) one by mouth at bedtime as needed  #30 x 0   Entered and Authorized by:   D. Thomos Lemons DO   Signed by:   D. Thomos Lemons DO on 04/07/2010   Method used:   Electronically to        [facility] [phone]* (retail)       [address]       [place], [place]  [address]       Ph: [phone]       Fax: [phone]   RxID:   [id number]    Orders Added: 1)  T-Sed Rate (Automated) [[id number]] 2)  T-Basic Metabolic Panel [id number] 3)  T-Lipid Profile [80061-22930] 4)  CRP, high sensitivity-[facility] [id number] 5)  T- Hemoglobin A1C [83036-23375] 6)  Est. Patient Level III [[address]]   Immunization History:  Influenza Immunization History:    Influenza:  historical (02/24/2010)   Immunization History:  Influenza Immunization History:    Influenza:  Historical (02/24/2010)      Current Allergies (reviewed today): ! CODEINE

## 2010-06-16 NOTE — Letter (Signed)
   [place] [facility] [address] [place], [place]  [address]  [place] Phone: [phone]      April 13, 2010   Leah Hampton [address] [place], [place] [address]  RE:  LAB RESULTS  Dear  [name],  The following is an interpretation of your most recent lab tests.  Please take note of any instructions provided or changes to medications that have resulted from your lab work.  ELECTROLYTES:  Good - no changes needed  KIDNEY FUNCTION TESTS:  Good - no changes needed  LIPID PANEL:  Fair - review at your next visit Triglyceride: 97   Cholesterol: 208   LDL: 139   HDL: 50   Chol/HDL%:  4.2 Ratio   DIABETIC STUDIES:  Fair - schedule a follow-up appointment Blood Glucose: 91   HgbA1C: 5.9     Sed rate - normal       Sincerely Yours,    Dr. Thomos Lemons  Appended Document:  mailed

## 2010-06-16 NOTE — Letter (Signed)
Summary: Letter Regarding Disease Mgmt Program/[employer]  Letter Regarding Disease Mgmt Program/[employer]   Imported By: Lanelle Bal 09/03/2009 08:22:59  _____________________________________________________________________  External Attachment:    Type:   Image     Comment:   External Document

## 2010-06-16 NOTE — Consult Note (Signed)
Summary: [facility]  [facility]   Imported By: Lanelle Bal 08/29/2009 12:11:18  _____________________________________________________________________  External Attachment:    Type:   Image     Comment:   External Document

## 2010-06-16 NOTE — Procedures (Signed)
Summary: Colonoscopy  Patient: [name] Note: All result statuses are Final unless otherwise noted.  Tests: (1) Colonoscopy (COL)   COL Colonoscopy           DONE     [facility]     [address]     [place], [place]  [address]           COLONOSCOPY PROCEDURE REPORT           PATIENT:  Leah Hampton, Leah Hampton  MR#:  [id number]     BIRTHDATE:  [birth year], 53 yrs. old  GENDER:  female           ENDOSCOPIST:  Rachael Fee, MD     Referred by:  Thomos Lemons, DO           PROCEDURE DATE:  07/02/2009     PROCEDURE:  Colonoscopy with snare polypectomy     ASA CLASS:  Class II     INDICATIONS:  Routine Risk Screening           MEDICATIONS:   Fentanyl 50 mcg IV, Versed 5 mg IV           DESCRIPTION OF PROCEDURE:   After the risks benefits and     alternatives of the procedure were thoroughly explained, informed     consent was obtained.  Digital rectal exam was performed and     revealed no abnormalities.   The LB [id number] endoscope     was introduced through the anus and advanced to the cecum, which     was identified by both the appendix and ileocecal valve, without     limitations.  The quality of the prep was good, using MoviPrep.     The instrument was then slowly withdrawn as the colon was fully     examined.     <<PROCEDUREIMAGES>>           FINDINGS:  There were multiple polyps identified and removed. A     total of three polyps were removed, two were retrieved and sent to     pathology (jar 1). One was 15mm, sessile, located in ascending     colon on top of a fold, removed with snare/cautery, sent to     pathology.  One was 3mm, sessile, located in descending colon,     removed with cold snare and sent to pathology. One was 2mm,     sessile, located in sigmoid colon, removed with cold snare, not     retrieved (see image3, image6, image7, and image8).  This was     otherwise a normal examination of the colon (see image10, image1,     and image2).    Retroflexed views in the rectum revealed no     abnormalities.   The scope was then withdrawn from the patient and     the procedure completed.           COMPLICATIONS:  None           ENDOSCOPIC IMPRESSION:     1) Three polyps (one was >1cm), all removed, two were retrieved     and sent to pathology     2) Otherwise normal examination           RECOMMENDATIONS:     1) If the polyp(s) removed today are proven to be adenomatous     (pre-cancerous) polyps, you will need a colonoscopy in 3-5 years.     Otherwise you should continue  to follow colorectal cancer     screening guidelines for "routine risk" patients with a     colonoscopy in 10 years.     2) You will receive a letter within 1-2 weeks with the results     of your biopsy as well as final recommendations. Please call my     office if you have not received a letter after 3 weeks.           ______________________________     Rachael Fee, MD           n.     eSIGNED:   Rachael Fee at 07/02/2009 11:33 AM           Lyna Poser, [id number]  Note: An exclamation mark (!) indicates a result that was not dispersed into the flowsheet. Document Creation Date: 07/02/2009 11:33 AM _______________________________________________________________________  (1) Order result status: Final Collection or observation date-time: 07/02/2009 11:23 Requested date-time:  Receipt date-time:  Reported date-time:  Referring Physician:   Ordering Physician: Rob Bunting [phone]) Specimen Source:  Source: Launa Grill Order Number: [id number] Lab site:   Appended Document: Colonoscopy recall     Procedures Next Due Date:    Colonoscopy: 06/2012

## 2010-06-18 NOTE — Assessment & Plan Note (Signed)
Summary: 1 month follow up/mhf   Vital Signs:  Patient profile:   55 year old female Menstrual status:  perimenopausal Height:      62.5 inches Weight:      268.25 pounds BMI:     48.46 O2 Sat:      98 % on Room air Temp:     98.4 degrees F oral Pulse rate:   90 / minute Resp:     20 per minute BP sitting:   126 / 72  (right arm) Cuff size:   large  Vitals Entered By: Glendell Docker CMA (May 08, 2010 3:20 PM)  O2 Flow:  Room air CC: 1 Month Follow up  Is Patient Diabetic? No Pain Assessment Patient in pain? no      Comments refill on fluconazole for breast rash   Primary Care Judythe Postema:  Dondra Spry DO  CC:  1 Month Follow up .  History of Present Illness: 55 y/o AA female for f/u right breast rash improved occ itches   headaches - improved sed rate was normal  Preventive Screening-Counseling & Management  Alcohol-Tobacco     Smoking Status: never  Allergies: 1)  ! Codeine  Past History:  Past Medical History: OBESITY (ICD-278.00) OTHER SCREENING BREAST EXAMINATION (ICD-V76.19)  ACUTE CYSTITIS (ICD-595.0) ENCOUNTER FOR LONG-TERM USE OF OTHER MEDICATIONS (ICD-V58.69) ROUTINE GYNECOLOGICAL EXAMINATION (ICD-V72.31) HYPERBILIRUBINEMIA (ICD-782.4) HX OF CHEST PAIN (ICD-786.50)  FAMILY HISTORY DIABETES 1ST DEGREE RELATIVE (ICD-V18.0) ANA POSITIVE (ICD-790.99)  HEMATURIA UNSPECIFIED (ICD-599.70) - urologic eval Dr. 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 Father had a myocardial infarction in his 4s.       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   Lungs:  normal respiratory effort and normal breath sounds.   Heart:  normal rate, regular rhythm, and no gallop.   Skin:  skin under right breast - no redness   Impression & Recommendations:  Problem # 1:  INTERTRIGO, CANDIDAL (ICD-695.89) Assessment Improved Improved with fluconozole continue to keep area dry  Problem # 2:  HEADACHE (ICD-784.0) Assessment: Improved improved but still experiencing headaches arrange home sleep study epworth score - 14  Her updated medication list for this problem includes:    Aspir-low 81 Mg Tbec (Aspirin) .Marland Kitchen... Take 1 tablet by mouth once a day  Complete Medication List: 1)  Aspir-low 81 Mg Tbec (Aspirin) .... Take 1 tablet by mouth once a day 2)  [facility] Vit D 5000 High-potency 5000 Unit Caps (Cholecalciferol) .... Take 1 capsule by mouth once a day 3)  Cyclobenzaprine Hcl 5 Mg Tabs (Cyclobenzaprine hcl) .... One by mouth at bedtime as needed 4)  Lovastatin 20 Mg Tabs (Lovastatin) .... One by mouth qpm  Patient Instructions: 1)  Please schedule a follow-up appointment in 3 months. 2)  BMP prior to visit, ICD-9:  790.29 3)  Hepatic Panel prior to visit, ICD-9: 272.4 4)  Lipid Panel prior to visit, ICD-9: 272.4 5)  HbgA1C prior to visit, ICD-9: 790.29 6)  Please return for lab work one (1) week before  your next appointment.  Prescriptions: LOVASTATIN 20 MG TABS (LOVASTATIN) one by mouth qpm  #90 x 1   Entered and Authorized by:   D. Thomos Lemons DO   Signed by:   D. Thomos Lemons DO on 05/08/2010   Method used:   Electronically to        [facility] [phone]* (retail)       [address]       [place], [place]  [address]       Ph: [phone]       Fax: [phone]   RxID:   [id number]    Orders Added: 1)  Est. Patient Level III [51884]     Current Allergies (reviewed today): ! CODEINE

## 2010-06-24 NOTE — Progress Notes (Signed)
Summary: Breast rash unresolved  Phone Note Call from Patient Call back at Work Phone [phone] Call back at [phone]   Caller: Patient Call For: D. Thomos Lemons DO Summary of Call: patient called and left voice message requesting a rx for Diflucan. She states the rash has not gone away after trying several things.  Initial call taken by: Glendell Docker CMA,  June 11, 2010 9:08 AM  Follow-up for Phone Call        plz advise pt to hold lovastatin while taking fluconazole x 2 weeks.  if rash does not resolve, she will need OV Follow-up by: D. Thomos Lemons DO,  June 11, 2010 6:08 PM  Additional Follow-up for Phone Call Additional follow up Details #1::        call placed to patient at [phone], [phone], no answer, no voice mail. Call placed to patient at [phone], voice message was left for patient to return call. Additional Follow-up by: Glendell Docker CMA,  June 12, 2010 8:46 AM    Additional Follow-up for Phone Call Additional follow up Details #2::    Left message on machine to return my  call. Nicki Guadalajara Fergerson CMA Duncan Dull)  June 12, 2010 2:40 PM   Left detailed message on pt's cell re: Dr Olegario Messier instruction and to call if any questions. Nicki Guadalajara Fergerson CMA (AAMA)  June 15, 2010 10:01 AM   New/Updated Medications: FLUCONAZOLE 100 MG TABS (FLUCONAZOLE) one by mouth once daily x 2 weeks Prescriptions: FLUCONAZOLE 100 MG TABS (FLUCONAZOLE) one by mouth once daily x 2 weeks  #14 x 0   Entered and Authorized by:   D. Thomos Lemons DO   Signed by:   D. Thomos Lemons DO on 06/11/2010   Method used:   Electronically to        [facility] [phone]* (retail)       [address]       [place], [place]  [address]       Ph: [phone]       Fax: [phone]   RxID:   [id number]

## 2010-07-29 ENCOUNTER — Encounter: Payer: Self-pay | Admitting: Internal Medicine

## 2010-08-04 ENCOUNTER — Other Ambulatory Visit: Payer: Self-pay | Admitting: Internal Medicine

## 2010-08-04 DIAGNOSIS — R7309 Other abnormal glucose: Secondary | ICD-10-CM

## 2010-08-04 DIAGNOSIS — E785 Hyperlipidemia, unspecified: Secondary | ICD-10-CM

## 2010-08-05 LAB — LIPID PANEL
Cholesterol: 178 mg/dL (ref 0–200)
HDL: 54 mg/dL (ref >39)
Total CHOL/HDL Ratio: 3.3 Ratio

## 2010-08-05 LAB — HEPATIC FUNCTION PANEL
ALT: 17 U/L (ref 0–35)
AST: 19 U/L (ref 0–37)
Albumin: 4 g/dL (ref 3.5–5.2)
Total Protein: 7.3 g/dL (ref 6.0–8.3)

## 2010-08-05 LAB — HEMOGLOBIN A1C: Hgb A1c MFr Bld: 6.3 % — ABNORMAL HIGH (ref <5.7)

## 2010-08-07 NOTE — Progress Notes (Signed)
Future labs released...sp

## 2010-08-07 NOTE — Progress Notes (Signed)
Addended by: Francee Piccolo on: 08/07/2010 10:53 AM   Modules accepted: Orders

## 2010-08-10 LAB — URINE MICROSCOPIC-ADD ON

## 2010-08-10 LAB — URINALYSIS, ROUTINE W REFLEX MICROSCOPIC
Bilirubin Urine: NEGATIVE
Glucose, UA: NEGATIVE mg/dL
Ketones, ur: NEGATIVE mg/dL
Protein, ur: NEGATIVE mg/dL

## 2010-08-11 ENCOUNTER — Encounter: Payer: Self-pay | Admitting: Internal Medicine

## 2010-08-11 ENCOUNTER — Ambulatory Visit (INDEPENDENT_AMBULATORY_CARE_PROVIDER_SITE_OTHER): Payer: Managed Care, Other (non HMO) | Admitting: Internal Medicine

## 2010-08-11 DIAGNOSIS — R51 Headache: Secondary | ICD-10-CM

## 2010-08-11 DIAGNOSIS — G4733 Obstructive sleep apnea (adult) (pediatric): Secondary | ICD-10-CM

## 2010-08-11 DIAGNOSIS — E785 Hyperlipidemia, unspecified: Secondary | ICD-10-CM

## 2010-08-11 DIAGNOSIS — R7309 Other abnormal glucose: Secondary | ICD-10-CM

## 2010-08-11 MED ORDER — PRAVASTATIN SODIUM 40 MG PO TABS: 40.0000 mg | ORAL_TABLET | Freq: Every day | ORAL | Status: DC

## 2010-08-11 MED ORDER — SITAGLIPTIN PHOSPHATE 100 MG PO TABS: 100.0000 mg | ORAL_TABLET | Freq: Every day | ORAL | Status: DC

## 2010-08-11 NOTE — Assessment & Plan Note (Signed)
Epworth sleepliness scale 14 Arrange split sleep study

## 2010-08-11 NOTE — Progress Notes (Signed)
  Subjective:    Patient ID: Leah Hampton, female    DOB: 1955-11-05, 55 y.o.   MRN: [id number]  HPI 55 y/o with hx of borderline DM II and hyperlipidemia for follow up Pt tolerating lovastatin Only mild drop in LDL  A1c is slight worse.  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  HENT:  Head: Normocephalic and atraumatic.  Cardiovascular: Normal rate, regular rhythm and normal heart sounds.   Pulmonary/Chest: Effort normal and breath sounds normal. She has no wheezes. She has no rales.  Skin: Skin is warm and dry.  Psychiatric: She has a normal mood and affect. Her behavior is normal.          Assessment & Plan:

## 2010-08-11 NOTE — Assessment & Plan Note (Signed)
LDL suboptimal Change lovastatin to pravastatin

## 2010-08-11 NOTE — Assessment & Plan Note (Signed)
A1c slightly worse.  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  She defers start of losartan.

## 2010-08-11 NOTE — Patient Instructions (Signed)
Our office will contact you re:  Arranging sleep study and referral to diabetic educator

## 2010-08-18 ENCOUNTER — Telehealth: Payer: Self-pay | Admitting: *Deleted

## 2010-08-18 NOTE — Telephone Encounter (Signed)
Patient called and left voice message stating she has place on her left hand that keeps bleeding. 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 She would like to know if Dr Artist Pais could refer to have it evaluated.  Boil or callus on hand about the size of pea for the past month, she states she had it evaluated by one of the medical doctors at work, and he advised her to follow up with her primary care provider.   Patient was advised to schedule appointment for evaluation.

## 2010-08-19 ENCOUNTER — Other Ambulatory Visit: Payer: Self-pay | Admitting: Internal Medicine

## 2010-08-19 DIAGNOSIS — E785 Hyperlipidemia, unspecified: Secondary | ICD-10-CM

## 2010-08-19 DIAGNOSIS — R7309 Other abnormal glucose: Secondary | ICD-10-CM

## 2010-08-20 ENCOUNTER — Encounter: Payer: Self-pay | Admitting: Internal Medicine

## 2010-08-20 ENCOUNTER — Ambulatory Visit (INDEPENDENT_AMBULATORY_CARE_PROVIDER_SITE_OTHER): Payer: Managed Care, Other (non HMO) | Admitting: Internal Medicine

## 2010-08-20 VITALS — BP 134/80 | HR 93 | Temp 97.9°F | Resp 20 | Wt 276.0 lb

## 2010-08-20 DIAGNOSIS — L0292 Furuncle, unspecified: Secondary | ICD-10-CM

## 2010-08-20 DIAGNOSIS — L0293 Carbuncle, unspecified: Secondary | ICD-10-CM

## 2010-08-20 MED ORDER — SULFAMETHOXAZOLE-TMP DS 800-160 MG PO TABS: 1.0000 | ORAL_TABLET | Freq: Two times a day (BID) | ORAL | Status: AC

## 2010-08-20 MED ORDER — OLMESARTAN MEDOXOMIL 20 MG PO TABS: ORAL_TABLET | ORAL | Status: DC

## 2010-08-20 MED ORDER — OLMESARTAN MEDOXOMIL 20 MG PO TABS: 20.0000 mg | ORAL_TABLET | Freq: Every day | ORAL | Status: DC

## 2010-08-20 MED ORDER — OLMESARTAN MEDOXOMIL 20 MG PO TABS: 10.0000 mg | ORAL_TABLET | Freq: Every day | ORAL | Status: DC

## 2010-08-20 NOTE — Patient Instructions (Signed)
Take Benicar 20 mg 1/2 tablet once daily

## 2010-08-22 ENCOUNTER — Encounter: Payer: Self-pay | Admitting: Internal Medicine

## 2010-08-22 DIAGNOSIS — L0293 Carbuncle, unspecified: Secondary | ICD-10-CM | POA: Insufficient documentation

## 2010-08-22 NOTE — Progress Notes (Signed)
  Subjective:    Patient ID: Leah Hampton, female    DOB: [birth year], 55 y.o.   MRN: [id number]  HPI 55 y/o female c/o skin lesion on left palm.   She noticed x 2-3 weeks.  She is not sure if wart or boil. She denies purulent drainage.  Mild bleeding   Review of Systems No fever or chills  Past Medical History  Diagnosis Date  . Obesity   . Cystitis   . Hyperbilirubinemia   . History of chest pain   . Hematuria     urologice eval, Dr Lindley Magnus  . Renal insufficiency   . Leukocytosis   . Osteoarthritis   . Hypertelorism   . Hyperlipemia     History   Social History  . Marital Status: Single    Spouse Name: N/A    Number of Children: 2  . Years of Education: N/A   Occupational History  . Cook     at [employer]   Social History Main Topics  . Smoking status: Never Smoker   . Smokeless tobacco: Not on file  . Alcohol Use: No  . Drug Use: No  . Sexually Active: Not on file   Other Topics Concern  . Not on file   Social History Narrative   Regular exercise:  No    Past Surgical History  Procedure Date  . Knee arthroscopy 04/2007    left knee  . 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 Diabetes    . Arthritis    . Stroke    . Coronary artery disease    . Heart failure Sister     x 2  . Heart failure Mother   . Heart attack Father 53    Allergies  Allergen Reactions  . Codeine     REACTION: nausea and vomiting    Current Outpatient Prescriptions on File Prior to Visit  Medication Sig Dispense Refill  . aspirin 81 MG tablet Take 81 mg by mouth daily.        . Cholecalciferol (VITAMIN D3) 5000 UNITS CAPS Take 1 capsule by mouth daily.        . cyclobenzaprine (FLEXERIL) 5 MG tablet Take 5 mg by mouth at bedtime as needed.        . pravastatin (PRAVACHOL) 40 MG tablet Take 1 tablet (40 mg total) by mouth daily.  90 tablet  3  . sitaGLIPtan (JANUVIA) 100 MG tablet Take 1  tablet (100 mg total) by mouth daily.  30 tablet  3    BP 134/80  Pulse 93  Temp(Src) 97.9 F (36.6 C) (Oral)  Resp 20  Wt 276 lb (125.193 kg)  SpO2 97%  LMP 06/17/2010       Objective:   Physical Exam  Constitutional: She appears well-developed and well-nourished.  Cardiovascular: Normal rate and regular rhythm.   Pulmonary/Chest: Effort normal and breath sounds normal.  Skin:       3-4 mm raised lesion left hypothenar eminence.  Mild underlying inderated area.  Slightly oozing,  tender          Assessment & Plan:

## 2010-08-22 NOTE — Assessment & Plan Note (Signed)
I suspect left hand lesion is carbuncle. Pt advised to take bactrim as directed. Also soak in epsom salt solution bid x 3-5 days Patient advised to call office if symptoms persist or worsen. Reassess in 1 week

## 2010-08-24 LAB — BASIC METABOLIC PANEL
BUN: 7 mg/dL (ref 6–23)
CO2: 29 mEq/L (ref 19–32)
CO2: 29 mEq/L (ref 19–32)
Calcium: 8.5 mg/dL (ref 8.4–10.5)
Chloride: 104 mEq/L (ref 96–112)
Chloride: 105 mEq/L (ref 96–112)
Creatinine, Ser: 0.63 mg/dL (ref 0.4–1.2)
Creatinine, Ser: 0.7 mg/dL (ref 0.4–1.2)
GFR calc Af Amer: >60 mL/min (ref >60)
GFR calc Af Amer: >60 mL/min (ref >60)
Glucose, Bld: 156 mg/dL — ABNORMAL HIGH (ref 70–99)
Potassium: 3.7 mEq/L (ref 3.5–5.1)

## 2010-08-24 LAB — URINALYSIS, ROUTINE W REFLEX MICROSCOPIC
Bilirubin Urine: NEGATIVE
Ketones, ur: NEGATIVE mg/dL
Nitrite: NEGATIVE
Protein, ur: NEGATIVE mg/dL
pH: 5 (ref 5.0–8.0)

## 2010-08-24 LAB — CBC
HCT: 31.8 % — ABNORMAL LOW (ref 36.0–46.0)
HCT: 38.3 % (ref 36.0–46.0)
Hemoglobin: 10.8 g/dL — ABNORMAL LOW (ref 12.0–15.0)
Hemoglobin: 12.7 g/dL (ref 12.0–15.0)
MCHC: 33.7 g/dL (ref 30.0–36.0)
MCHC: 33.8 g/dL (ref 30.0–36.0)
MCV: 88.4 fL (ref 78.0–100.0)
MCV: 88.5 fL (ref 78.0–100.0)
MCV: 89.1 fL (ref 78.0–100.0)
Platelets: 228 10*3/uL (ref 150–400)
RBC: 3.42 MIL/uL — ABNORMAL LOW (ref 3.87–5.11)
RBC: 3.47 MIL/uL — ABNORMAL LOW (ref 3.87–5.11)
RBC: 3.6 MIL/uL — ABNORMAL LOW (ref 3.87–5.11)
RBC: 4.3 MIL/uL (ref 3.87–5.11)
RDW: 15.2 % (ref 11.5–15.5)
WBC: 10.3 10*3/uL (ref 4.0–10.5)
WBC: 13.4 10*3/uL — ABNORMAL HIGH (ref 4.0–10.5)

## 2010-08-24 LAB — COMPREHENSIVE METABOLIC PANEL
Albumin: 3.5 g/dL (ref 3.5–5.2)
Alkaline Phosphatase: 73 U/L (ref 39–117)
BUN: 13 mg/dL (ref 6–23)
CO2: 27 mEq/L (ref 19–32)
Chloride: 109 mEq/L (ref 96–112)
Creatinine, Ser: 0.72 mg/dL (ref 0.4–1.2)
GFR calc non Af Amer: >60 mL/min (ref >60)
Glucose, Bld: 97 mg/dL (ref 70–99)
Potassium: 4.8 mEq/L (ref 3.5–5.1)
Total Bilirubin: 0.4 mg/dL (ref 0.3–1.2)

## 2010-08-24 LAB — APTT: aPTT: 29 seconds (ref 24–37)

## 2010-08-24 LAB — URINE CULTURE: Colony Count: 30000

## 2010-08-24 LAB — DIFFERENTIAL
Basophils Absolute: 0 10*3/uL (ref 0.0–0.1)
Basophils Relative: 0 % (ref 0–1)
Lymphocytes Relative: 36 % (ref 12–46)
Monocytes Absolute: 0.9 10*3/uL (ref 0.1–1.0)
Neutro Abs: 5.6 10*3/uL (ref 1.7–7.7)
Neutrophils Relative %: 54 % (ref 43–77)

## 2010-08-24 LAB — ABO/RH: ABO/RH(D): O POS

## 2010-08-24 LAB — PROTIME-INR: Prothrombin Time: 13.4 seconds (ref 11.6–15.2)

## 2010-08-24 LAB — URINE MICROSCOPIC-ADD ON

## 2010-08-31 ENCOUNTER — Encounter: Payer: Self-pay | Admitting: Family

## 2010-08-31 ENCOUNTER — Encounter: Payer: Managed Care, Other (non HMO) | Attending: Internal Medicine

## 2010-08-31 ENCOUNTER — Ambulatory Visit (INDEPENDENT_AMBULATORY_CARE_PROVIDER_SITE_OTHER): Payer: Managed Care, Other (non HMO) | Admitting: Family

## 2010-08-31 ENCOUNTER — Other Ambulatory Visit: Payer: Self-pay | Admitting: Internal Medicine

## 2010-08-31 VITALS — BP 110/68 | HR 60 | Temp 98.1°F | Resp 16 | Ht 62.5 in | Wt 267.1 lb

## 2010-08-31 DIAGNOSIS — L0293 Carbuncle, unspecified: Secondary | ICD-10-CM

## 2010-08-31 DIAGNOSIS — Z713 Dietary counseling and surveillance: Secondary | ICD-10-CM | POA: Insufficient documentation

## 2010-08-31 DIAGNOSIS — Z1231 Encounter for screening mammogram for malignant neoplasm of breast: Secondary | ICD-10-CM

## 2010-08-31 DIAGNOSIS — L0292 Furuncle, unspecified: Secondary | ICD-10-CM

## 2010-08-31 DIAGNOSIS — E119 Type 2 diabetes mellitus without complications: Secondary | ICD-10-CM | POA: Insufficient documentation

## 2010-08-31 NOTE — Patient Instructions (Signed)
You will be contacted about your referral to dermatology. Call us if you develop increased pain, redness or swelling of the area of the left hand.

## 2010-09-02 ENCOUNTER — Telehealth: Payer: Self-pay | Admitting: Family

## 2010-09-02 NOTE — Telephone Encounter (Signed)
Spoke with patient re: c/o HA which was not addressed at her last visit.  She reports that this has improved.  She denies sinus drainage.

## 2010-09-02 NOTE — Progress Notes (Signed)
Subjective:    Patient ID: Leah Hampton, female    DOB: 19-Mar-1956, 55 y.o.   MRN: [id number]  HPI  [name] is a 55 year old female who presents today for follow up of her skin infection.  She was seen by Dr. Artist Pais on 4/5 and was treated with bactrim for suspected carbuncle on her left hand.  She reports that she has been doing the epsom salts as directed.  She notes no improvement in the appearance of the lesion.  Denies fever or drainage from lesion.   Review of Systems See HPI    Past Medical History  Diagnosis Date  . Obesity   . Cystitis   . Hyperbilirubinemia   . History of chest pain   . Hematuria     urologice eval, Dr Lindley Magnus  . Renal insufficiency   . Leukocytosis   . Osteoarthritis   . Hypertelorism   . Hyperlipemia     History   Social History  . Marital Status: Single    Spouse Name: N/A    Number of Children: 2  . Years of Education: N/A   Occupational History  . Cook     at [employer]   Social History Main Topics  . Smoking status: Never Smoker   . Smokeless tobacco: Not on file  . Alcohol Use: No  . Drug Use: No  . Sexually Active: Not on file   Other Topics Concern  . Not on file   Social History Narrative   Regular exercise:  No    Past Surgical History  Procedure Date  . Knee arthroscopy 04/2007    left knee  . 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 Diabetes    . Arthritis    . Stroke    . Coronary artery disease    . Heart failure Sister     x 2  . Heart failure Mother   . Heart attack Father 66    Allergies  Allergen Reactions  . Sulfamethoxazole-Trimethoprim (Bactrim) Itching  . Codeine     REACTION: nausea and vomiting    Current Outpatient Prescriptions on File Prior to Visit  Medication Sig Dispense Refill  . aspirin 81 MG tablet Take 81 mg by mouth daily.        . Cholecalciferol (VITAMIN D3) 5000 UNITS CAPS Take 1 capsule by mouth  daily.        . cyclobenzaprine (FLEXERIL) 5 MG tablet Take 5 mg by mouth at bedtime as needed.        Marland Kitchen olmesartan (BENICAR) 20 MG tablet Take 1/2 tablet in AM  30 tablet  0  . pravastatin (PRAVACHOL) 40 MG tablet Take 1 tablet (40 mg total) by mouth daily.  90 tablet  3  . sitaGLIPtan (JANUVIA) 100 MG tablet Take 1 tablet (100 mg total) by mouth daily.  30 tablet  3    BP 110/68  Pulse 60  Temp(Src) 98.1 F (36.7 C) (Oral)  Resp 16  Ht 5' 2.5" (1.588 m)  Wt 267 lb 1.9 oz (121.165 kg)  BMI 48.08 kg/m2  LMP 06/17/2010    Objective:   Physical Exam  Constitutional: She appears well-developed and well-nourished.  Cardiovascular: Normal rate and regular rhythm.   Pulmonary/Chest: Effort normal and breath sounds normal.  Skin:       Small round ulcerated lesion with clean red base, on  left palm. Thickened skin surrounding the margins.  No drainage, no associated erythema.           Assessment & Plan:

## 2010-09-02 NOTE — Assessment & Plan Note (Signed)
Lesion on left hand is not improved with antibiotics.  Will plan referral to dermatology for further evaluation.  Lesion may be a wart.  Pt instructed to call if her symptoms worsen.  She verbalizes understanding.

## 2010-09-07 ENCOUNTER — Telehealth: Payer: Self-pay | Admitting: *Deleted

## 2010-09-07 NOTE — Telephone Encounter (Signed)
Patient called and left voice message stating her insurance will no longer cover her Januvia and she would like to know if Dr Artist Pais would change the medication to something else

## 2010-09-07 NOTE — Telephone Encounter (Signed)
Please provide samples if possible until next office visit.  Pt can take 1/2 dose of 100 mg Please advise patient to notify office if significant change in her blood sugars

## 2010-09-08 NOTE — Telephone Encounter (Signed)
Call placed to patient at [phone], no answer. A detailed voice message was left informing patient per Dr Artist Pais instructions. She was advised to call back if any questions. Samples of Januvia left at front desk for patient pick up

## 2010-09-14 ENCOUNTER — Other Ambulatory Visit: Payer: Self-pay | Admitting: Internal Medicine

## 2010-09-14 DIAGNOSIS — Z1231 Encounter for screening mammogram for malignant neoplasm of breast: Secondary | ICD-10-CM

## 2010-09-16 ENCOUNTER — Ambulatory Visit (HOSPITAL_BASED_OUTPATIENT_CLINIC_OR_DEPARTMENT_OTHER): Payer: Managed Care, Other (non HMO)

## 2010-09-16 ENCOUNTER — Ambulatory Visit
Admission: RE | Admit: 2010-09-16 | Discharge: 2010-09-16 | Disposition: A | Discharge status: Home / Self Care | Payer: Managed Care, Other (non HMO) | Source: Ambulatory Visit | Attending: Internal Medicine | Admitting: Internal Medicine

## 2010-09-16 DIAGNOSIS — Z1231 Encounter for screening mammogram for malignant neoplasm of breast: Secondary | ICD-10-CM

## 2010-09-25 ENCOUNTER — Encounter (HOSPITAL_BASED_OUTPATIENT_CLINIC_OR_DEPARTMENT_OTHER): Payer: Managed Care, Other (non HMO)

## 2010-09-29 NOTE — Op Note (Signed)
NAME:  Leah Hampton, CAPUTO NO.:  [id number]   MEDICAL RECORD NO.:  [id number]          PATIENT TYPE:  AMB   LOCATION:  DSC                          FACILITY:  [facility]   PHYSICIAN:  Dyke Brackett, M.D.    DATE OF BIRTH:  1955/11/10   DATE OF PROCEDURE:  04/21/2007  DATE OF DISCHARGE:                               OPERATIVE REPORT   INDICATIONS:  A 55 year old female, MRI proven meniscal tearing thought  to be amenable to outpatient surgery.   PREOPERATIVE DIAGNOSES:  1. Medial and lateral meniscal tears, left knee.  2. Osteoarthritis of patellofemoral joint lateral and medial      compartment.   POSTOPERATIVE DIAGNOSES:  1. Medial and lateral meniscal tears, left knee.  2. Osteoarthritis of patellofemoral joint lateral and medial      compartment.   OPERATION:  1. Partial medial and lateral meniscectomies.  2. Debridement chondroplasty (tricompartmental).   SURGEON:  Dyke Brackett, M.D.   ANESTHESIA:  General with a knee block.   DESCRIPTION OF PROCEDURE:  The patient had inferomedial and  inferolateral portals created.  She had mild-to-moderate chondromalacia  patella which was debrided.  She had some very mild chondromalacia of  the lateral compartments, small fraying type tear of the lateral  meniscus requiring resection of about 10% of the meniscus substance.  Medial compartment was the site of a lot of the pathology with  complex  tear of the posterior horn of the meniscus requiring resection of about  30-40% of the meniscus substance.  This was also noted to have grade 3  changes, mainly on the femur.  There were some advanced grade 3 changes  on the posterior medial tibial surface underneath the meniscus tear.  Again, the posterior horn of the meniscus was by and large torn and  resected grade 3 debridement carried out, very extensive grade 3 changes  on the femoral side were noted, less severe grade 3 changes on the  tibial side.  Knee drained free of  fluid.  Portals closed with nylon,  infiltrated with Marcaine with morphine __________ 80 mg Depo-Medrol,  taken to recovery room in stable condition.      Dyke Brackett, M.D.  Electronically Signed     WDC/MEDQ  D:  04/21/2007  T:  04/21/2007  Job:  [id number]

## 2010-09-29 NOTE — Discharge Summary (Signed)
NAME:  LYNNETT, LANGLINAIS NO.:  [id number]   MEDICAL RECORD NO.:  [id number]          PATIENT TYPE:  INP   LOCATION:  5003                         FACILITY:  [facility]   PHYSICIAN:  Dyke Brackett, M.D.    DATE OF BIRTH:  1955/11/06   DATE OF ADMISSION:  10/25/2008  DATE OF DISCHARGE:  10/29/2008                               DISCHARGE SUMMARY   DIAGNOSIS FOR THIS ADMISSION:  Osteoarthritis, left knee.   PROCEDURE:  Left total knee replacement.   HISTORY OF PRESENT ILLNESS:  The patient is a 55 year old woman with end-  stage arthritis of her left knee, having failed conservative treatment,  she wishes to proceed with a total knee arthroplasty.  Therapy risks and  benefits were discussed preoperatively and she wished to proceed with  total knee arthroplasty.  Preoperative history and physical revealed her  to have a history of osteoarthritis as well as asthma.  She also has a  history of hypertension and hyperlipidemia.   FAMILY HISTORY:  Positive for arthritis, diabetes, CVA and CAD.   ALLERGIES:  CODEINE.   SOCIAL HISTORY:  Significant for no tobacco, no alcohol.  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  She is not married.  No other drug use to report.   REVIEW OF SYSTEMS:  The patient denies any recent illnesses, shortness  of breath, chest pain.   PHYSICAL EXAMINATION:  VITAL SIGNS:  Her physical examination shows  temperature 97.7, pulse of 84, respirations 14, blood pressure 128/64.  GENERAL:  She is well-developed, overly-nourished.  HEENT:  Head is normocephalic, atraumatic.  Ears:  Tympanic membranes  clear.  Eyes: Pupils equal, round, reactive to light and accommodation.  Nose and throat benign.  NECK:  Full range of motion.  CHEST:  Lungs clear to auscultation.  CARDIAC:  Regular rate and rhythm without murmur.  ABDOMEN:  Soft, nontender, nondistended with positive bowel sounds.  NEUROLOGICAL:  She is neurovascularly intact.  SKIN:  Within normal limits.  MUSCULOSKELETAL:  Exam shows left lower extremity with no obvious  injury.  Full internal and external rotation of the hips.  Examination  of the left knee shows pain with range of motion.  She is  neurovascularly in both lower extremities.   LABORATORY DATA:  Preoperative labs including CBC, CMET, chest x-ray,  EKG, PT and PTT were all within normal limits.   HOSPITAL COURSE:  On the day of admission, the patient was taken to the  operating room at [facility] where underwent a left total knee  replacement using an LCS cemented standard femur, standard patella, size  3 tibia, 20 mm bearing by Dr. Madelon Lips.  The patient was placed on  perioperative antibiotics.  She was placed on postoperative Lovenox for  deep venous thrombosis prophylaxis.  She was placed on PCA pain pump and  CPM was begun in the postoperative area.  Physical therapy was consulted  first postoperative day.  On postoperative day #1 her pain was well  controlled.  No other complaints  She was afebrile, vital signs were  stable.  Hemoglobin and white count were stable  and she was otherwise  doing well.  Her PCA and drain were discontinued.  Physical therapy was  continued per protocol.  Discussion with case management showed the  patient had inadequate home support for consistent care during her  postoperative period and steps were begun for a skilled nursing facility  bed placement for rehab stay.  On postoperative day #2 the patient was  still complaining of moderate pain.  Wound was clean, dry.  She was  otherwise stable and doing well.  Physical therapy continued.  The FL2  form was signed.  On postoperative day #3 the patient was complaining of  some itching and moderate pain, tolerating diet well.  Hemoglobin was  10.1, white blood cells 13.4, other vital signs and laboratory stable,  alert and oriented x3. Her wound was clean and dry.  She was tolerating  the CPM from 0 to 90.  No obvious rash resulting from the  itching.  Skilled nursing placement continued.  On postoperative day #4, the  patient was complaining of moderate pain, itching and resolved after  changing medication to Vicodin from the Percocet.  The wound remained  clean and dry.  She is making slow, steady progress with physical  therapy and was both medically and orthopedically stable.  She was  transferred to the care of a skilled nursing facility at that point.  At  the time of her discharge, her weight-bearing status is 50% weight-  bearing on the left lower extremity using a walker.  She should continue  with the CPM 0 to 90 for 6 to 8 hours per day.   DISCHARGE MEDICATIONS:  1. Lovenox 30 mg injection q.12h. for a total of 14 days      postoperative.  2. Colace 100 mg b.i.d.  3. Lamisil 250 mg daily.  4. Robaxin 500 mg p.o. q.6h. p.r.n. spasm.  5. Senokot or other laxative of choice as needed.  6. Enema as needed.  7. Tylenol 1 to 2 q.4h. p.r.n. temperature greater than 101.  8. Zofran 4 mg p.o. q.6h. p.r.n. nausea.  9. Vicodin or Norco 5/325 mg one to two q.4h. p.r.n. pain.  10.Benadryl 25 mg q.6h. p.r.n. itching.   WOUND CARE:  Dressing changes daily or as needed to keep the wound clean  and dry.   DIET:  Regular.   FOLLOWUP:  The patient should return to see Dr. Madelon Lips in approximately  10 to 14 days postoperatively.  Please call [phone] for an appointment.   DISCHARGE DIAGNOSIS:  End stage degenerative joint disease of left knee.      Laural Benes. Hassie Bruce, M.D.  Electronically Signed   JBR/MEDQ  D:  10/29/2008  T:  10/29/2008  Job:  [id number]

## 2010-09-29 NOTE — Op Note (Signed)
NAME:  Leah Hampton, Leah Hampton NO.:  [id number]   MEDICAL RECORD NO.:  [id number]          PATIENT TYPE:  INP   LOCATION:  5003                         FACILITY:  [facility]   PHYSICIAN:  Dyke Brackett, M.D.    DATE OF BIRTH:  03-Jan-1956   DATE OF PROCEDURE:  10/25/2008  DATE OF DISCHARGE:                               OPERATIVE REPORT   INDICATIONS:  This is a 55 year old with end-stage osteoarthritis of  left knee, thought to be amenable to the knee replacement with  comorbidities of morbid obesity.   PREOPERATIVE DIAGNOSIS:  Osteoarthritis, left knee.   POSTOPERATIVE DIAGNOSIS:  Osteoarthritis, left knee.   OPERATION:  Left total knee replacement (LCS cemented standard femur,  standard patella, size 3 tibia with 20-mm bearing).   SURGEON:  Dyke Brackett, MD   ASSISTANT:  Felicie Morn, PA-C   TOURNIQUET TIME:  1 hour 43 minutes.   PROCEDURE:  Sterile prep and prep, exsanguination of the leg.  It should  be noted that the technical degree of difficulty with this knee was  greatly increased by the patient's high BMI greater BMI than 130.  She  was approached with the exsanguination of leg and inflation of  tourniquet at 400 mmHg.  With a straight skin incision, medial  parapatellar approach was then made.  The tibia was cut about 2.5 mm  below the most diseased medial compartment which was actually worse more  medially than thought.  This basically made the cut, almost flush with  the fibular head laterally.  A fair amount of bone had to be taken  laterally to get down to any bone surface medially.  The femur was cut  anterior-posterior femoral cut with a flexion gap of initially measured  at 15 mm and this showed a significant gap with the trials.  This was  increased to 20 due to probable ligamentotaxis.  There was also  significant flexion contracture of the knee which corrected with cuts  followed by distal femoral cut of 4-degree valgus, finishing cut and  chamfers and the keyhole for the prosthesis.  Tibia was cut with a keel  hole for the tibia with a size 3 tibial trial followed by cutting the  patella leaving about 15-16 mm of native patella for 3 peg trial.  Trials were placed.  Range of motion 0-95, could not be gone past 95 due  to the large size of the leg.  The trials were removed.  The final  components were inserted with the exception of the tibial bearing.  Once  the cement was allowed to harden, the bearing was removed.  Excess  cement was removed from the posterior aspect of the knee.  Tourniquet  was deflated.  No excessive bleeding was noted at the posterior aspirate  of the knee.  Hemovac drain was placed exiting superolaterally and  closure was effected on the capsule with #1 Ethibond with two 2-0 Vicryl  and skin clips.  Marcaine with epinephrine infiltrated around the skin.      Dyke Brackett, M.D.  Electronically Signed     WDC/MEDQ  D:  10/25/2008  T:  10/25/2008  Job:  [id number]

## 2010-10-02 NOTE — Discharge Summary (Signed)
NAME:  Leah Hampton, Leah Hampton NO.:  [id number]   MEDICAL RECORD NO.:  [id number]          PATIENT TYPE:  INP   LOCATION:  5003                         FACILITY:  [facility]   PHYSICIAN:  Dyke Brackett, M.D.    DATE OF BIRTH:  03-Oct-1955   DATE OF ADMISSION:  10/25/2008  DATE OF DISCHARGE:  10/29/2008                               DISCHARGE SUMMARY   ADMISSION DIAGNOSIS:  Left knee osteoarthritis.   DISCHARGE AND FINAL DIAGNOSIS:  Status post left knee total knee  replacement for a left knee osteoarthritis.   SURGEON:  Dyke Brackett, MD   PHYSICIAN ASSISTANT:  Joslyn Devon. [place], [place]   PROCEDURAL NOTE:  The patient was admitted to the hospital on October 25, 2008, for a left knee osteoarthritis, taken to the operating room for  left total knee replacement, performed by Dr. Madelon Lips, and transferred  to the PACU in stable condition.   HOSPITAL COURSE:  The patient is a 55 year old female that was  transferred from the PACU, status post left total knee replacement to  the 5000 orthopedic floor in stable condition.  First postop day on October 26, 2008, the patient was stable, wound benign, seen by Dr. Dion Saucier, note  was attached to the chart.  Drain was left intact.  Foley removed.  On  postop day #2 on October 27, 2008, the patient was doing well.  Hemoglobin  was 10, wound benign, and vitals were stable.  Information attached to  the chart, otherwise.  On postop day #3 on October 28, 2008, the patient  was doing well with physical therapy, apparently hemoglobin was at 10.1  having some itching with Vicodin, so was held in the hospital for  another day, concerns about the itching.  On postop day #4, itching  resolved.  She was transferred from Percocet to Vicodin, the itching was  on Percocet, otherwise doing well, and discharged to home on postop day  #4.   ASSESSMENT AND PLAN:  Leah Hampton is a 55 year old female, status post  left total knee replacement and was discharged home,  weightbearing as  tolerated, and plan for followup with Dr. Madelon Lips in 10 days for staple  removal.  Regular diet, physical therapy, CPM 0-90 at home.   DISCHARGE MEDICATIONS:  1. Lamisil 250 mg p.o. daily.  2. Norco probably 5 or 7.5, I do not see attached in the chart here,      mg - 325 one to two tablets every 4-6 hours      p.r.n. pain.  3. Lovenox 40 mg subcu for 14 days postoperatively.  4. Robaxin 500 mg 1 tablet p.o. q.6-8 h. p.r.n. pain or spasm.      Sharol Given, PA      Dyke Brackett, M.D.  Electronically Signed    JBS/MEDQ  D:  11/20/2008  T:  11/21/2008  Job:  [id number]

## 2010-10-07 ENCOUNTER — Encounter: Payer: Self-pay | Admitting: Family

## 2010-10-07 ENCOUNTER — Ambulatory Visit (INDEPENDENT_AMBULATORY_CARE_PROVIDER_SITE_OTHER): Payer: Managed Care, Other (non HMO) | Admitting: Family

## 2010-10-07 VITALS — BP 110/66 | HR 90 | Temp 98.1°F | Resp 16 | Ht 62.5 in | Wt 269.0 lb

## 2010-10-07 DIAGNOSIS — N920 Excessive and frequent menstruation with regular cycle: Secondary | ICD-10-CM | POA: Insufficient documentation

## 2010-10-07 LAB — CBC
MCV: 86.9 fL (ref 78.0–100.0)
Platelets: 352 10*3/uL (ref 150–400)
RBC: 4.2 MIL/uL (ref 3.87–5.11)
WBC: 10.3 10*3/uL (ref 4.0–10.5)

## 2010-10-07 NOTE — Progress Notes (Signed)
  Subjective:    Patient ID: Leah Hampton, female    DOB: 21-Nov-1955, 55 y.o.   MRN: [id number]  HPI  Ms.  Below is a 55 year old female who presents today with chief complaint of heavy menstrual bleeding. Started spotting over the weekend.  Today started bleeding very heavy with clots.  She has used 8-10 pads so far today.  Seems to be easing off.  Denies history of fibroids.  She sees Dr. Jennette Kettle. LMP prior was February 1st.  Denies associated hot flashes.    Review of Systems     Past Medical History  Diagnosis Date  . Obesity   . Cystitis   . Hyperbilirubinemia   . History of chest pain   . Hematuria     urologice eval, Dr Lindley Magnus  . Renal insufficiency   . Leukocytosis   . Osteoarthritis   . Hypertelorism   . Hyperlipemia     History   Social History  . Marital Status: Single    Spouse Name: N/A    Number of Children: 2  . 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 Smoking status: Never Smoker   . Smokeless tobacco: Not on file  . Alcohol Use: No  . Drug Use: No  . Sexually Active: Not on file   Other Topics Concern  . Not on file   Social History Narrative   Regular exercise:  No    Past Surgical History  Procedure Date  . Knee arthroscopy 04/2007    left knee  . 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 Diabetes    . Arthritis    . Stroke    . Coronary artery disease    . Heart failure Sister     x 2  . Heart failure Mother   . Heart attack Father 63    Allergies  Allergen Reactions  . Sulfamethoxazole-Trimethoprim (Bactrim) Itching  . Codeine     REACTION: nausea and vomiting    Current Outpatient Prescriptions on File Prior to Visit  Medication Sig Dispense Refill  . aspirin 81 MG tablet Take 81 mg by mouth daily.        . cyclobenzaprine (FLEXERIL) 5 MG tablet Take 5 mg by mouth at bedtime as  needed.        Marland Kitchen olmesartan (BENICAR) 20 MG tablet Take 1/2 tablet in AM  30 tablet  0  . pravastatin (PRAVACHOL) 40 MG tablet Take 1 tablet (40 mg total) by mouth daily.  90 tablet  3  . DISCONTD: sitaGLIPtan (JANUVIA) 100 MG tablet Take 1 tablet (100 mg total) by mouth daily.  30 tablet  3  . Cholecalciferol (VITAMIN D3) 5000 UNITS CAPS Take 1 capsule by mouth daily.          BP 110/66  Pulse 90  Temp(Src) 98.1 F (36.7 C) (Oral)  Resp 16  Ht 5' 2.5" (1.588 m)  Wt 269 lb 0.6 oz (122.036 kg)  BMI 48.42 kg/m2  LMP 06/17/2010    Objective:   Physical Exam Gen:  Awake, alert, NAD CV: S1/S2, RRR, no murmur Resp: BS CTA bilaterally without wheezes, rales, or rhonchi Abdomen: Soft, NT, ND, +BS Psych: alert and oriented x 3.        Assessment & Plan:

## 2010-10-07 NOTE — Patient Instructions (Signed)
Please complete your blood work on the first floor. You will be contacted about your appointment with Dr. Jennette Kettle.

## 2010-10-07 NOTE — Assessment & Plan Note (Signed)
Will check CBC and set her up to see her GYN.  I have advised pt to go to the ED if she develops severe or worsening menstrual bleeding.  She verbalizes understanding

## 2010-11-05 NOTE — Progress Notes (Signed)
Addended by: Mervin Kung A on: 11/05/2010 10:49 AM   Modules accepted: Orders

## 2010-11-06 ENCOUNTER — Ambulatory Visit (HOSPITAL_BASED_OUTPATIENT_CLINIC_OR_DEPARTMENT_OTHER): Payer: Managed Care, Other (non HMO) | Attending: Internal Medicine

## 2010-11-06 DIAGNOSIS — G4733 Obstructive sleep apnea (adult) (pediatric): Secondary | ICD-10-CM | POA: Insufficient documentation

## 2010-11-19 DIAGNOSIS — G4733 Obstructive sleep apnea (adult) (pediatric): Secondary | ICD-10-CM

## 2010-11-20 NOTE — Procedures (Signed)
NAME:  Leah Hampton, Leah Hampton NO.:  [id number]  MEDICAL RECORD NO.:  [id number]          PATIENT TYPE:  OUT  LOCATION:  SLEEP CENTER                 FACILITY:  [facility]  PHYSICIAN:  Barbaraann Share, MD,FCCPDATE OF BIRTH:  [birth year]-06-1955  DATE OF STUDY:  11/06/2010                           NOCTURNAL POLYSOMNOGRAM  REFERRING PHYSICIAN:  Barbette Hair. Artist Pais, DO  INDICATION FOR STUDY:  Hypersomnia with sleep apnea.  EPWORTH SLEEPINESS SCORE:  12.  MEDICATIONS:  SLEEP ARCHITECTURE:  The patient had a total sleep time of 379 minutes with decreased quantity of slow wave sleep as well as REM.  Sleep onset latency was normal at 10 minutes, and REM onset was prolonged at 183 minutes.  Sleep efficiency was mildly reduced at 89%.  RESPIRATORY DATA:  The patient was found to have 2 obstructive apneas and 55 obstructive hypopneas, giving her an apnea/hypopnea index of 9 events per hour.  The events occurred in all body positions, but were increased during REM.  There was moderate snoring noted throughout.  The patient did not meet split night protocol secondary to the majority of her events occurring after 1 a.m.  OXYGEN DATA:  There was O2 desaturation as low as 87% with the patient's obstructive events.  CARDIAC DATA:  No clinically significant arrhythmias were noted.  MOVEMENT-PARASOMNIA:  The patient was found to have 42 leg jerks with 1 per hour resulting in arousal or awakening.  This is considered insignificant.  There were no behavioral abnormalities noted.  IMPRESSIONS-RECOMMENDATIONS:  Very mild obstructive sleep apnea/hypopnea syndrome with an apnea/hypopnea index of 9 events per hour and O2 desaturation as low as 87%.  The patient did not meet split night protocol secondary to the small numbers of events and also most of them occurring after 1 a.m.  Treatment for this degree of sleep apnea can include a trial of weight loss alone, upper airway surgery, dental appliance, and  also CPAP.  The decision to treat this degree of sleep apnea should be based on its impact to the patient's quality of life, since more than likely this has very little impact on cardiovascular health given its mild nature.     Barbaraann Share, MD,FCCP Diplomate, American Board of Sleep Medicine Electronically Signed    KMC/MEDQ  D:  11/19/2010 15:05:50  T:  11/20/2010 04:56:02  Job:  [id number]

## 2010-11-27 ENCOUNTER — Encounter: Payer: Self-pay | Admitting: Internal Medicine

## 2010-12-07 ENCOUNTER — Ambulatory Visit: Payer: Managed Care, Other (non HMO) | Admitting: Internal Medicine

## 2010-12-10 ENCOUNTER — Encounter: Payer: Self-pay | Admitting: Internal Medicine

## 2010-12-10 ENCOUNTER — Ambulatory Visit (INDEPENDENT_AMBULATORY_CARE_PROVIDER_SITE_OTHER): Payer: Managed Care, Other (non HMO) | Admitting: Internal Medicine

## 2010-12-10 DIAGNOSIS — M79609 Pain in unspecified limb: Secondary | ICD-10-CM

## 2010-12-10 DIAGNOSIS — G4733 Obstructive sleep apnea (adult) (pediatric): Secondary | ICD-10-CM | POA: Insufficient documentation

## 2010-12-10 DIAGNOSIS — E669 Obesity, unspecified: Secondary | ICD-10-CM

## 2010-12-10 DIAGNOSIS — M79646 Pain in unspecified finger(s): Secondary | ICD-10-CM

## 2010-12-10 DIAGNOSIS — I10 Essential (primary) hypertension: Secondary | ICD-10-CM

## 2010-12-10 MED ORDER — OLMESARTAN MEDOXOMIL 20 MG PO TABS: ORAL_TABLET | ORAL | Status: DC

## 2010-12-10 MED ORDER — SITAGLIPTIN PHOSPHATE 100 MG PO TABS: 50.0000 mg | ORAL_TABLET | Freq: Every day | ORAL | Status: DC

## 2010-12-10 MED ORDER — PRAVASTATIN SODIUM 40 MG PO TABS: 40.0000 mg | ORAL_TABLET | Freq: Every day | ORAL | Status: DC

## 2010-12-10 NOTE — Assessment & Plan Note (Signed)
Recommend dietary modification, exercise and weight loss

## 2010-12-10 NOTE — Assessment & Plan Note (Signed)
Stable. Samples provided. Consider change to losartan in the future for cost consideration

## 2010-12-10 NOTE — Progress Notes (Signed)
  Subjective:    Patient ID: ALEESIA HENNEY, female    DOB: 12-21-1955, 55 y.o.   MRN: [id number]  HPI Patient presents to clinic for evaluation of sleep apnea. Recently completed sleep study with a finding of very mild sleep apnea not felt to be medically significant. Patient denies snoring, apnea or daytime somnolence. Complains of mild bilateral thumb pain located at the base. Does perform repetitive movements at her job. Not currently taking any medication for this. Reviewed diabetic control checking fingerstick blood sugar 3 days a week. Fingerstick blood sugar ranges between 99 and 120 without hypoglycemia. No other complaints  Reviewed past medical history, medications and allergies  Review of Systems see history of present illness     Objective:   Physical Exam  Nursing note and vitals reviewed. Constitutional: She appears well-developed and well-nourished.  HENT:  Head: Normocephalic and atraumatic.  Right Ear: External ear normal.  Left Ear: External ear normal.  Eyes: Conjunctivae are normal. No scleral icterus.  Musculoskeletal:       Full range of motion bilateral palms without erythema warmth or effusion. No bony abnormality or tenderness.  Neurological: She is alert.  Skin: Skin is warm and dry.  Psychiatric: She has a normal mood and affect.          Assessment & Plan:   No problem-specific assessment & plan notes found for this encounter.

## 2010-12-10 NOTE — Assessment & Plan Note (Signed)
Clinical history suggest overuse. Attempt OTC NSAID p.r.n. With food.Followup if no improvement or worsening.

## 2010-12-10 NOTE — Assessment & Plan Note (Signed)
Mild. CPAP not recommended. Encourage weight loss

## 2010-12-17 ENCOUNTER — Other Ambulatory Visit: Payer: Self-pay | Admitting: Dermatology

## 2011-01-26 ENCOUNTER — Ambulatory Visit: Payer: Managed Care, Other (non HMO) | Admitting: Internal Medicine

## 2011-02-09 ENCOUNTER — Ambulatory Visit (INDEPENDENT_AMBULATORY_CARE_PROVIDER_SITE_OTHER): Payer: Managed Care, Other (non HMO) | Admitting: Internal Medicine

## 2011-02-09 ENCOUNTER — Encounter: Payer: Self-pay | Admitting: Internal Medicine

## 2011-02-09 ENCOUNTER — Ambulatory Visit (HOSPITAL_BASED_OUTPATIENT_CLINIC_OR_DEPARTMENT_OTHER)
Admission: RE | Admit: 2011-02-09 | Discharge: 2011-02-09 | Disposition: A | Discharge status: Home / Self Care | Payer: Managed Care, Other (non HMO) | Source: Ambulatory Visit | Attending: Internal Medicine | Admitting: Internal Medicine

## 2011-02-09 DIAGNOSIS — M79609 Pain in unspecified limb: Secondary | ICD-10-CM

## 2011-02-09 DIAGNOSIS — E785 Hyperlipidemia, unspecified: Secondary | ICD-10-CM

## 2011-02-09 DIAGNOSIS — L299 Pruritus, unspecified: Secondary | ICD-10-CM

## 2011-02-09 DIAGNOSIS — E119 Type 2 diabetes mellitus without complications: Secondary | ICD-10-CM

## 2011-02-09 DIAGNOSIS — M7989 Other specified soft tissue disorders: Secondary | ICD-10-CM

## 2011-02-09 DIAGNOSIS — R7309 Other abnormal glucose: Secondary | ICD-10-CM

## 2011-02-09 NOTE — Assessment & Plan Note (Signed)
Obtain cbc, chem7, a1c, and urine microalbumin. Schedule optho referral for diabetic eye exams. Encouraged pt to continue to monitor fsbs and if pursues diabetic diet, exercise and wt loss could possibly stop [place].

## 2011-02-09 NOTE — Assessment & Plan Note (Signed)
Obtain lipid/lft. 

## 2011-02-09 NOTE — Assessment & Plan Note (Signed)
LE [place] to r/o DVT

## 2011-02-09 NOTE — Patient Instructions (Addendum)
Please schedule cbc, chem7, a1c, urine microalbumin (250.0) and lipid/lft (272.4) for next Tues. Also please schedule chem7, a1c 250.0 and lipid/lft 272.4 prior to next visit

## 2011-02-09 NOTE — Assessment & Plan Note (Signed)
Hold mobic for several days to see if related to sx's.

## 2011-02-09 NOTE — Progress Notes (Signed)
  Subjective:    Patient ID: YSABELA Hampton, female    DOB: 09/21/1955, 55 y.o.   MRN: [id number]  HPI Pt presents to clinic for followup of multiple medical problems. Notes generalized mild itching for >1 month. No obvious triggers (changes in soaps/detergents) though most recent new medication was mobic qd from orthopedics. Notes right calf enlargement and tenderness. No h/o thromboembolic dz. FSBS <130 taking [place] 50mg  3x/wk. No other complaints.  Past Medical History  Diagnosis Date  . Obesity   . Cystitis   . Hyperbilirubinemia   . History of chest pain   . Hematuria     urologice eval, Dr Lindley Magnus  . Renal insufficiency   . Leukocytosis   . Osteoarthritis   . Hypertelorism   . Hyperlipemia    Past Surgical History  Procedure Date  . Knee arthroscopy 04/2007    left knee  . 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 She has never used smokeless tobacco. She reports that she does not drink alcohol or use illicit drugs. family history includes Arthritis in an unspecified family member; Coronary artery disease in an unspecified family member; Diabetes in an unspecified family member; Heart attack (age of onset:70) in her father; Heart failure in her mother and sister; and Stroke in an unspecified family member. Allergies  Allergen Reactions  . Sulfamethoxazole-Trimethoprim (Bactrim) Itching  . Codeine     REACTION: nausea and vomiting     Review of Systems see hpi     Objective:   Physical Exam  Physical Exam  Nursing note and vitals reviewed. Constitutional: Appears well-developed and well-nourished. No distress.  HENT:  Head: Normocephalic and atraumatic.  Right Ear: External ear normal.  Left Ear: External ear normal.  Eyes: Conjunctivae are normal. No scleral icterus.  Neck: Neck supple. Carotid bruit is not present.  Cardiovascular: Normal rate, regular rhythm and normal heart sounds.  Exam reveals no gallop and no  friction rub.   No murmur heard. Pulmonary/Chest: Effort normal and breath sounds normal. No respiratory distress. He has no wheezes. no rales.  Lymphadenopathy:    He has no cervical adenopathy.  Neurological:Alert.  Skin: Skin is warm and dry. Not diaphoretic.  Psychiatric: Has a normal mood and affect.   MSK: right calf size larger than left. +tenderness right lateral calf. No palpable cords      Assessment & Plan:

## 2011-02-12 ENCOUNTER — Other Ambulatory Visit: Payer: Self-pay | Admitting: Internal Medicine

## 2011-02-12 DIAGNOSIS — E785 Hyperlipidemia, unspecified: Secondary | ICD-10-CM

## 2011-02-22 LAB — POCT HEMOGLOBIN-HEMACUE
Hemoglobin: 12.3
Operator id: 123881

## 2011-04-09 ENCOUNTER — Other Ambulatory Visit: Payer: Self-pay | Admitting: Internal Medicine

## 2011-04-09 DIAGNOSIS — E785 Hyperlipidemia, unspecified: Secondary | ICD-10-CM

## 2011-04-09 LAB — CBC
HCT: 39 % (ref 36.0–46.0)
Hemoglobin: 12.8 g/dL (ref 12.0–15.0)
MCH: 28.3 pg (ref 26.0–34.0)
MCHC: 32.8 g/dL (ref 30.0–36.0)
MCV: 86.3 fL (ref 78.0–100.0)
Platelets: 279 10*3/uL (ref 150–400)
RBC: 4.52 MIL/uL (ref 3.87–5.11)
RDW: 15.3 % (ref 11.5–15.5)
WBC: 10.5 10*3/uL (ref 4.0–10.5)

## 2011-04-09 LAB — BASIC METABOLIC PANEL
BUN: 11 mg/dL (ref 6–23)
CO2: 26 mEq/L (ref 19–32)
Calcium: 8.8 mg/dL (ref 8.4–10.5)
Chloride: 105 mEq/L (ref 96–112)
Creat: 0.67 mg/dL (ref 0.50–1.10)
Glucose, Bld: 94 mg/dL (ref 70–99)
Potassium: 4.2 mEq/L (ref 3.5–5.3)
Sodium: 140 mEq/L (ref 135–145)

## 2011-04-09 LAB — HEPATIC FUNCTION PANEL
ALT: 12 U/L (ref 0–35)
AST: 15 U/L (ref 0–37)
Albumin: 3.7 g/dL (ref 3.5–5.2)
Total Bilirubin: 0.4 mg/dL (ref 0.3–1.2)

## 2011-04-09 LAB — LIPID PANEL
HDL: 46 mg/dL (ref >39)
Total CHOL/HDL Ratio: 3.6 Ratio

## 2011-04-09 LAB — HEMOGLOBIN A1C
Hgb A1c MFr Bld: 5.6 % (ref <5.7)
Mean Plasma Glucose: 114 mg/dL (ref <117)

## 2011-04-09 LAB — MICROALBUMIN / CREATININE URINE RATIO: Microalb Creat Ratio: 470.1 mg/g — ABNORMAL HIGH (ref 0.0–30.0)

## 2011-05-18 DIAGNOSIS — J40 Bronchitis, not specified as acute or chronic: Secondary | ICD-10-CM

## 2011-05-18 HISTORY — DX: Bronchitis, not specified as acute or chronic: J40

## 2011-07-13 ENCOUNTER — Encounter: Payer: Self-pay | Admitting: Internal Medicine

## 2011-07-13 ENCOUNTER — Ambulatory Visit (INDEPENDENT_AMBULATORY_CARE_PROVIDER_SITE_OTHER): Payer: Managed Care, Other (non HMO) | Admitting: Internal Medicine

## 2011-07-13 DIAGNOSIS — R7309 Other abnormal glucose: Secondary | ICD-10-CM

## 2011-07-13 DIAGNOSIS — E119 Type 2 diabetes mellitus without complications: Secondary | ICD-10-CM

## 2011-07-13 DIAGNOSIS — G5600 Carpal tunnel syndrome, unspecified upper limb: Secondary | ICD-10-CM | POA: Insufficient documentation

## 2011-07-13 LAB — BASIC METABOLIC PANEL
CO2: 23 mEq/L (ref 19–32)
Calcium: 9.5 mg/dL (ref 8.4–10.5)
Sodium: 141 mEq/L (ref 135–145)

## 2011-07-13 LAB — HEMOGLOBIN A1C
Hgb A1c MFr Bld: 6.1 % — ABNORMAL HIGH (ref <5.7)
Mean Plasma Glucose: 128 mg/dL — ABNORMAL HIGH (ref <117)

## 2011-07-13 NOTE — Patient Instructions (Signed)
Please schedule chem7, a1c (250.0) and lipid/lft (272.4) prior to next visit 

## 2011-07-14 LAB — MICROALBUMIN / CREATININE URINE RATIO
Creatinine, Urine: 223.8 mg/dL
Microalb, Ur: 0.97 mg/dL (ref 0.00–1.89)

## 2011-07-18 NOTE — Assessment & Plan Note (Signed)
Obtain chem7, a1c and urine microalbumin. If control remains excellent consider dc of [place]

## 2011-07-18 NOTE — Assessment & Plan Note (Signed)
Attempt bilateral wrist splints. Followup if no improvement or worsening.  

## 2011-07-18 NOTE — Progress Notes (Signed)
  Subjective:    Patient ID: Leah Hampton, female    DOB: 06/12/1955, 56 y.o.   MRN: [id number]  HPI Pt presents to clinic for followup of multiple medical problems. Notes intermittet R>L hand numbness without neck pain, radicular arm pain or neurologic sx's. No arm or hand weakness. H/o well controlled dm on januvia 1/2 qd. Defers foot exam.  Past Medical History  Diagnosis Date  . Obesity   . Cystitis   . Hyperbilirubinemia   . History of chest pain   . Hematuria     urologice eval, Dr Lindley Magnus  . Renal insufficiency   . Leukocytosis   . Osteoarthritis   . Hypertelorism   . Hyperlipemia    Past Surgical History  Procedure Date  . Knee arthroscopy 04/2007    left knee  . 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 She has never used smokeless tobacco. She reports that she does not drink alcohol or use illicit drugs. family history includes Arthritis in an unspecified family member; Coronary artery disease in an unspecified family member; Diabetes in an unspecified family member; Heart attack (age of onset:70) in her father; Heart failure in her mother and sister; and Stroke in an unspecified family member. Allergies  Allergen Reactions  . Sulfamethoxazole-Trimethoprim (Bactrim) Itching  . Codeine     REACTION: nausea and vomiting      Review of Systems see hpi     Objective:   Physical Exam  Physical Exam  Nursing note and vitals reviewed. Constitutional: Appears well-developed and well-nourished. No distress.  HENT:  Head: Normocephalic and atraumatic.  Right Ear: External ear normal.  Left Ear: External ear normal.  Eyes: Conjunctivae are normal. No scleral icterus.  Neck: Neck supple. Carotid bruit is not present.  Cardiovascular: Normal rate, regular rhythm and normal heart sounds.  Exam reveals no gallop and no friction rub.   No murmur heard. Pulmonary/Chest: Effort normal and breath sounds normal. No respiratory  distress. He has no wheezes. no rales.  Lymphadenopathy:    He has no cervical adenopathy.  Neurological:Alert.  Skin: Skin is warm and dry. Not diaphoretic.  Psychiatric: Has a normal mood and affect.  MSK: +/- phalen's on the right. No thenar muscle wasting. Distal strength 5/5. From bilateral hands and arm.      Assessment & Plan:

## 2011-07-22 ENCOUNTER — Other Ambulatory Visit: Payer: Self-pay | Admitting: Internal Medicine

## 2011-07-22 DIAGNOSIS — E785 Hyperlipidemia, unspecified: Secondary | ICD-10-CM

## 2011-08-04 ENCOUNTER — Ambulatory Visit (INDEPENDENT_AMBULATORY_CARE_PROVIDER_SITE_OTHER): Payer: Managed Care, Other (non HMO) | Admitting: Family

## 2011-08-04 ENCOUNTER — Encounter: Payer: Self-pay | Admitting: Family

## 2011-08-04 VITALS — BP 132/78 | HR 91 | Temp 98.0°F | Resp 20

## 2011-08-04 DIAGNOSIS — M791 Myalgia, unspecified site: Secondary | ICD-10-CM

## 2011-08-04 DIAGNOSIS — R21 Rash and other nonspecific skin eruption: Secondary | ICD-10-CM

## 2011-08-04 DIAGNOSIS — IMO0001 Reserved for inherently not codable concepts without codable children: Secondary | ICD-10-CM

## 2011-08-04 MED ORDER — MELOXICAM 7.5 MG PO TABS: 7.5000 mg | ORAL_TABLET | Freq: Every day | ORAL | Status: DC

## 2011-08-04 MED ORDER — VALACYCLOVIR HCL 500 MG PO TABS: 500.0000 mg | ORAL_TABLET | Freq: Two times a day (BID) | ORAL | Status: DC

## 2011-08-04 NOTE — Patient Instructions (Signed)
Please call if your symptoms worsen, or if no improvement in next 1 week.

## 2011-08-04 NOTE — Progress Notes (Signed)
Subjective:    Patient ID: Leah Hampton, female    DOB: 10-29-1955, 56 y.o.   MRN: [id number]  HPI  Leah Hampton is a 56 yr old female who presents today with two concerns:  L arm pain- Pain is located at base of left bicep muscle, worse with bending/lifting.  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  Rectal pain-  Rectal pain- reports hx of hemorrhoid >30 yrs ago.  Denies blood in stool.  Denies constipation.  Has been present for several days.     Review of Systems See HPI  Past Medical History  Diagnosis Date  . Obesity   . Cystitis   . Hyperbilirubinemia   . History of chest pain   . Hematuria     urologice eval, Dr Lindley Magnus  . Renal insufficiency   . Leukocytosis   . Osteoarthritis   . Hypertelorism   . Hyperlipemia     History   Social History  . Marital Status: Single    Spouse Name: N/A    Number of Children: 2  . Years of Education: N/A   Occupational History  . Cook     at [employer]   Social History Main Topics  . Smoking status: Never Smoker   . Smokeless tobacco: Never Used  . Alcohol Use: No  . Drug Use: No  . Sexually Active: Not on file   Other Topics Concern  . Not on file   Social History Narrative   Regular exercise:  No    Past Surgical History  Procedure Date  . Knee arthroscopy 04/2007    left knee  . 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 Diabetes    . Arthritis    . Stroke    . Coronary artery disease    . Heart failure Sister     x 2  . Heart failure Mother   . Heart attack Father 50    Allergies  Allergen Reactions  . Sulfamethoxazole-Trimethoprim (Bactrim) Itching  . Codeine     REACTION: nausea and vomiting    Current Outpatient Prescriptions on File Prior to Visit  Medication Sig Dispense Refill  . aspirin 81 MG tablet Take 81 mg by mouth daily.        . Cholecalciferol (VITAMIN D3) 5000 UNITS CAPS Take 1 capsule by  mouth daily.        . pravastatin (PRAVACHOL) 40 MG tablet Take 1 tablet (40 mg total) by mouth daily.  90 tablet  1  . olmesartan (BENICAR) 20 MG tablet Take 1/2 tablet in AM  30 tablet  5  . sitaGLIPtin (JANUVIA) 100 MG tablet Take 0.5 tablets (50 mg total) by mouth daily. Pt is only taking 1/2 tablet daily.  30 tablet  5    BP 132/78  Pulse 91  Temp(Src) 98 F (36.7 C) (Oral)  Resp 20  LMP 07/06/2011       Objective:   Physical Exam  Constitutional: She appears well-developed and well-nourished.  Cardiovascular: Normal rate and regular rhythm.   No murmur heard. Pulmonary/Chest: Effort normal and breath sounds normal. No respiratory distress. She has no wheezes. She has no rales. She exhibits no tenderness.  Musculoskeletal:       Left elbow: tenderness found.  Skin:       Ulcerated lesions left buttock within gluteal fold.  Psychiatric: She has  a normal mood and affect. Her speech is normal and behavior is normal.          Assessment & Plan:

## 2011-08-05 DIAGNOSIS — M791 Myalgia, unspecified site: Secondary | ICD-10-CM | POA: Insufficient documentation

## 2011-08-05 DIAGNOSIS — R21 Rash and other nonspecific skin eruption: Secondary | ICD-10-CM | POA: Insufficient documentation

## 2011-08-05 LAB — HSV 2 ANTIBODY, IGG: HSV 2 Glycoprotein G Ab, IgG: 14.59 IV — ABNORMAL HIGH

## 2011-08-05 NOTE — Assessment & Plan Note (Signed)
Appears to be HSV break out. She denies known history of HSV or new partners.  Will send titers and treat with valtrex.

## 2011-08-05 NOTE — Assessment & Plan Note (Signed)
Trial of short term mobic.

## 2011-08-06 ENCOUNTER — Telehealth: Payer: Self-pay | Admitting: Family

## 2011-08-06 NOTE — Telephone Encounter (Signed)
Pt.notified

## 2011-08-06 NOTE — Telephone Encounter (Signed)
Left message requesting that pt return our call.  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 It appears to be an infection that she has had for some time.  She should complete valtrex and let us know if her rash worsens or if it does not occur.  She should use protection with future partners to decrease risk of transmitting to partner.

## 2011-08-19 ENCOUNTER — Other Ambulatory Visit: Payer: Self-pay | Admitting: *Deleted

## 2011-08-19 MED ORDER — PRAVASTATIN SODIUM 40 MG PO TABS: 40.0000 mg | ORAL_TABLET | Freq: Every day | ORAL | Status: DC

## 2011-08-19 NOTE — Telephone Encounter (Signed)
Call placed to patient at [phone], she is requesting refill of pravastatin to [facility] on [address] in [place].  Rx sent to pharmacy. Patient is aware.

## 2011-08-19 NOTE — Telephone Encounter (Signed)
Patient called and left voice message requesting a refill on her cholesterol medication. She has requested a return phone call at [phone].

## 2011-09-14 ENCOUNTER — Ambulatory Visit (INDEPENDENT_AMBULATORY_CARE_PROVIDER_SITE_OTHER): Payer: Managed Care, Other (non HMO) | Admitting: Family Medicine

## 2011-09-14 VITALS — BP 153/92

## 2011-09-14 DIAGNOSIS — M25569 Pain in unspecified knee: Secondary | ICD-10-CM

## 2011-09-14 DIAGNOSIS — M25561 Pain in right knee: Secondary | ICD-10-CM

## 2011-09-14 MED ORDER — DICLOFENAC SODIUM 1 % TD GEL: 1.0000 "application " | Freq: Four times a day (QID) | TRANSDERMAL | Status: DC

## 2011-09-14 MED ORDER — ETODOLAC 400 MG PO TABS: 400.0000 mg | ORAL_TABLET | Freq: Two times a day (BID) | ORAL | Status: DC

## 2011-09-14 NOTE — Progress Notes (Signed)
Patient Name: Leah Hampton Date of Birth: [birth year] Medical Record Number: [id number] Gender: female Date of Encounter: 09/14/2011  History of Present Illness:  Leah Hampton is a 56 y.o. very pleasant female patient who presents with the following:  Very pleasant  African American female with a history of LEFT total knee arthroplasty in 2010 by Dr. Madelon Lips, who presents with right-sided knee pain has been worsening for the last year. 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 She has been taking some Mobic 7.5 mg daily without much significant relief. She also occasionally ices her knee, which will help sometimes.  She has some limitation in her flexion, and she occasionally does have some swelling in her knee. She's not had any buckling or any mechanical locking up. No prior right-sided knee history of operative intervention or relevant fractures.  Wt Readings from Last 3 Encounters:  07/13/11 284 lb (128.822 kg)  02/09/11 277 lb (125.646 kg)  12/10/10 272 lb 0.6 oz (123.397 kg)   Patient Active Problem List  Diagnoses  . HYPERLIPIDEMIA  . OBESITY  . HYPERTENSION  . OSTEOARTHRITIS  . BACK PAIN, LUMBAR  . HYPERBILIRUBINEMIA  . HEADACHE  . CHEST PAIN  . DIABETES MELLITUS, TYPE II, BORDERLINE  . Carbuncle  . Menorrhagia  . OSA (obstructive sleep apnea)  . Thumb pain  . Itching  . Calf swelling  . CTS (carpal tunnel syndrome)  . Rash  . Muscular pain   Past Medical History  Diagnosis Date  . Obesity   . Cystitis   . Hyperbilirubinemia   . History of chest pain   . Hematuria     urologice eval, Dr Lindley Magnus  . Renal insufficiency   . Leukocytosis   . Osteoarthritis   . Hypertelorism   . Hyperlipemia    Past Surgical History  Procedure Date  . Knee arthroscopy 04/2007    left knee  . 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 Smoking status: Never Smoker   . Smokeless  tobacco: Never Used  . Alcohol Use: No   Family History  Problem Relation Age of Onset  . Diabetes    . Arthritis    . Stroke    . Coronary artery disease    . Heart failure Sister     x 2  . Heart failure Mother   . Heart attack Father 67   Allergies  Allergen Reactions  . Sulfamethoxazole-Trimethoprim (Bactrim) Itching  . Codeine     REACTION: nausea and vomiting    Medication list has been reviewed and updated.  Review of Systems:  GEN: No fevers, chills. Nontoxic. Primarily MSK c/o today. MSK: Detailed in the HPI GI: tolerating PO intake without difficulty Neuro: No numbness, parasthesias, or tingling associated. Otherwise the pertinent positives of the ROS are noted above.    Physical Examination: Filed Vitals:   09/14/11 1601  BP: 153/92    There is no height or weight on file to calculate BMI.   GEN: WDWN, NAD, Non-toxic, Alert & Oriented x 3 HEENT: Atraumatic, Normocephalic.  Ears and Nose: No external deformity. EXTR: No clubbing/cyanosis/edema NEURO: Normal gait.  PSYCH: Normally interactive. Conversant. Not depressed or anxious appearing.  Calm demeanor.   Knee:  R Gait: Normal heel toe pattern, mild antalgia ROM: 0-100 Effusion: neg Echymosis or edema: none Patellar tendon NT Painful PLICA: neg Patellar grind: negative Medial  and lateral patellar facet loading: negative medial and lateral joint lines: TTP medial joint line, mild lateral joint line Mcmurray's pain Flexion-pinch pos Varus and valgus stress: stable Lachman: neg Ant and Post drawer: neg Hip abduction, IR, ER: WNL Hip flexion str: 5/5 Hip abd: 5/5 Quad: 5/5 VMO atrophy: mod Hamstring concentric and eccentric: 5/5   Assessment and Plan:  1. Right knee pain  DG Knee Bilateral Standing AP, DG Knee 1-2 Views Right   With prior LEFT knee total arthroplasty in weight, and limitation in her flexion and medial joint line pain, presumed multiple compartmental osteoarthritis.  Exacerbation.  Interarticular knee injection today. Change to Lodine p.o. B.i.d., and also trial of Voltaren gel.  Follow up in 4-5 weeks to reassess.  Knee Injection, R Patient verbally consented to procedure. Risks (including potential rare risk of infection), benefits, and alternatives explained. Sterilely prepped with Chloraprep. Ethyl cholride used for anesthesia. 9 cc Lidocaine 1% mixed with 1 cc of Depo-Medrol 40 mg injected using the anteromedial approach without difficulty. No complications with procedure and tolerated well. Patient had decreased pain post-injection.   Orders Today: Orders Placed This Encounter  Procedures  . DG Knee Bilateral Standing AP    Standing Status: Future     Number of Occurrences:      Standing Expiration Date: 11/13/2012    Order Specific Question:  Preferred imaging location?    Answer:  [facility]    Order Specific Question:  Reason for exam:    Answer:  r knee pain  . DG Knee 1-2 Views Right    Standing Status: Future     Number of Occurrences:      Standing Expiration Date: 11/13/2012    Order Specific Question:  Preferred imaging location?    Answer:  [facility]    Order Specific Question:  Reason for exam:    Answer:  r knee pain    Medications Today: Meds ordered this encounter  Medications  . etodolac (LODINE) 400 MG tablet    Sig: Take 1 tablet (400 mg total) by mouth 2 (two) times daily.    Dispense:  60 tablet    Refill:  3  . diclofenac sodium (VOLTAREN) 1 % GEL    Sig: Apply 1 application topically 4 (four) times daily.    Dispense:  5 Tube    Refill:  prn

## 2011-09-15 ENCOUNTER — Ambulatory Visit
Admission: RE | Admit: 2011-09-15 | Discharge: 2011-09-15 | Disposition: A | Discharge status: Home / Self Care | Payer: Managed Care, Other (non HMO) | Source: Ambulatory Visit | Attending: Family Medicine | Admitting: Family Medicine

## 2011-09-15 DIAGNOSIS — M25561 Pain in right knee: Secondary | ICD-10-CM

## 2011-09-16 ENCOUNTER — Telehealth: Payer: Self-pay | Admitting: *Deleted

## 2011-09-16 NOTE — Telephone Encounter (Signed)
Message copied by Mora Bellman on Thu Sep 16, 2011  9:22 AM ------      Message from: Hannah Beat      Created: Wed Sep 15, 2011  5:05 PM       Call please            Arthritis is pretty bad in r knee      Moderate to severe            L knee replacement looks fine            Hope she does well and can recheck next month

## 2011-09-16 NOTE — Telephone Encounter (Signed)
Called pt left VM.

## 2011-09-16 NOTE — Telephone Encounter (Signed)
Message copied by Mora Bellman on Thu Sep 16, 2011  4:47 PM ------      Message from: Hannah Beat      Created: Wed Sep 15, 2011  5:05 PM       Call please            Arthritis is pretty bad in r knee      Moderate to severe            L knee replacement looks fine            Hope she does well and can recheck next month

## 2011-09-16 NOTE — Telephone Encounter (Signed)
Spoke with pt- gave her x-ray results. 

## 2011-09-16 NOTE — Telephone Encounter (Signed)
Spoke with pt- gave her message from D. Copland.

## 2011-09-27 ENCOUNTER — Other Ambulatory Visit: Payer: Self-pay | Admitting: Internal Medicine

## 2011-09-27 DIAGNOSIS — Z1231 Encounter for screening mammogram for malignant neoplasm of breast: Secondary | ICD-10-CM

## 2011-10-04 ENCOUNTER — Other Ambulatory Visit: Payer: Self-pay | Admitting: Internal Medicine

## 2011-10-04 ENCOUNTER — Ambulatory Visit
Admission: RE | Admit: 2011-10-04 | Discharge: 2011-10-04 | Disposition: A | Discharge status: Home / Self Care | Payer: Managed Care, Other (non HMO) | Source: Ambulatory Visit | Attending: Internal Medicine | Admitting: Internal Medicine

## 2011-10-04 DIAGNOSIS — Z1231 Encounter for screening mammogram for malignant neoplasm of breast: Secondary | ICD-10-CM

## 2011-10-12 ENCOUNTER — Encounter: Payer: Self-pay | Admitting: Family Medicine

## 2011-10-12 ENCOUNTER — Ambulatory Visit (INDEPENDENT_AMBULATORY_CARE_PROVIDER_SITE_OTHER): Payer: Managed Care, Other (non HMO) | Admitting: Family Medicine

## 2011-10-12 VITALS — BP 133/85 | HR 94 | Ht 62.5 in | Wt 284.0 lb

## 2011-10-12 DIAGNOSIS — IMO0002 Reserved for concepts with insufficient information to code with codable children: Secondary | ICD-10-CM

## 2011-10-12 DIAGNOSIS — M1710 Unilateral primary osteoarthritis, unspecified knee: Secondary | ICD-10-CM

## 2011-10-12 DIAGNOSIS — M1711 Unilateral primary osteoarthritis, right knee: Secondary | ICD-10-CM | POA: Insufficient documentation

## 2011-10-12 NOTE — Assessment & Plan Note (Signed)
Glucocorticoid injection lasted less than 1 month. Will try Viscosupplementation next week.  Continue diclofenac prn. Recommended horse liniment to help with pain.

## 2011-10-12 NOTE — Progress Notes (Signed)
  Subjective:    Patient ID: Leah Hampton, female    DOB: 1955/06/30, 56 y.o.   MRN: [id number]  HPI This is a 56 year old obese female who has had a left knee replacement in the past and who presents for follow-up of right knee pain due to osteoarthritis. Her pain improved after receiving the steroid injection about 1 month ago. She has been taking diclofenac but has not been able to get the Voltaren gel due to cost.  She has been trying to stretch and started a low-carbohydrate diet recently.   Review of Systems Per HPI.  Past Medical History, Family History, and Social History reviewed.  Significant for history of knee arthritis s/p left TKR, T2DM    Objective:   Physical Exam GEN: NAD; African-American; morbidly obese PSYCH: very pleasant, engaged, alert and oriented x 3 PULM: NI WOB  GEN: WDWN, NAD, Non-toxic, Alert & Oriented x 3 HEENT: Atraumatic, Normocephalic.  Ears and Nose: No external deformity. EXTR: No clubbing/cyanosis/edema NEURO: Normal gait.  PSYCH: Normally interactive. Conversant. Not depressed or anxious appearing.  Calm demeanor.   Knee:  RIGHT ROM: 90 deg flexion Effusion: neg Echymosis or edema: none Patellar tendon NT Painful PLICA:  Patellar grind: positive Medial and lateral patellar facet loading: positive medial and lateral joint lines: positive, worse lateral Mcmurray's  Flexion-pinch Varus and valgus stress:  Lachman: neg Ant and Post drawer: neg    Assessment & Plan:

## 2011-10-14 ENCOUNTER — Ambulatory Visit: Payer: Managed Care, Other (non HMO) | Admitting: Internal Medicine

## 2011-10-15 ENCOUNTER — Ambulatory Visit (INDEPENDENT_AMBULATORY_CARE_PROVIDER_SITE_OTHER): Payer: Managed Care, Other (non HMO) | Admitting: Internal Medicine

## 2011-10-15 ENCOUNTER — Encounter: Payer: Self-pay | Admitting: Internal Medicine

## 2011-10-15 VITALS — BP 124/80 | HR 86 | Temp 98.2°F | Resp 18 | Wt 286.0 lb

## 2011-10-15 DIAGNOSIS — I10 Essential (primary) hypertension: Secondary | ICD-10-CM

## 2011-10-15 DIAGNOSIS — R7309 Other abnormal glucose: Secondary | ICD-10-CM

## 2011-10-15 DIAGNOSIS — R059 Cough, unspecified: Secondary | ICD-10-CM

## 2011-10-15 DIAGNOSIS — E785 Hyperlipidemia, unspecified: Secondary | ICD-10-CM

## 2011-10-15 DIAGNOSIS — R05 Cough: Secondary | ICD-10-CM

## 2011-10-15 MED ORDER — FLUTICASONE PROPIONATE 50 MCG/ACT NA SUSP: 2.0000 | Freq: Every day | NASAL | Status: DC

## 2011-10-15 NOTE — Assessment & Plan Note (Signed)
Obtain chem7 and a1c 

## 2011-10-15 NOTE — Assessment & Plan Note (Signed)
Obtain lipid/lft. 

## 2011-10-15 NOTE — Patient Instructions (Addendum)
Please schedule fasting labs for next week.  Chem7, a1c-250.0 and lipid/lft-272.4 Also a chest xray has been ordered for the same time Please schedule labs prior to your next appointment as well (cbc, chem7, a1c-250.0)

## 2011-10-15 NOTE — Assessment & Plan Note (Signed)
Normotensive and stable. Continue current regimen. Monitor bp as outpt and followup in clinic as scheduled.  

## 2011-10-15 NOTE — Progress Notes (Signed)
  Subjective:    Patient ID: Leah Hampton, female    DOB: 1955-12-06, 56 y.o.   MRN: [id number]  HPI Pt presents to clinic for followup of multiple medical problems. BP reviewed normotensive. Has one month h/o NP cough without dyspnea. No exacerbating or alleviating factors. Tolerating statin tx. Following with sports medicine for chronic knee pain. Attempting to lose weight.   Past Medical History  Diagnosis Date  . Obesity   . Cystitis   . Hyperbilirubinemia   . History of chest pain   . Hematuria     urologice eval, Dr Lindley Magnus  . Renal insufficiency   . Leukocytosis   . Osteoarthritis   . Hypertelorism   . Hyperlipemia    Past Surgical History  Procedure Date  . Knee arthroscopy 04/2007    left knee  . 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 She has never used smokeless tobacco. She reports that she does not drink alcohol or use illicit drugs. family history includes Arthritis in an unspecified family member; Coronary artery disease in an unspecified family member; Diabetes in an unspecified family member; Heart attack (age of onset:70) in her father; Heart failure in her mother and sister; and Stroke in an unspecified family member. Allergies  Allergen Reactions  . Sulfamethoxazole-Trimethoprim (Bactrim) Itching  . Codeine     REACTION: nausea and vomiting      Review of Systems see hpi     Objective:   Physical Exam  Physical Exam  Nursing note and vitals reviewed. Constitutional: Appears well-developed and well-nourished. No distress.  HENT:  Head: Normocephalic and atraumatic. Bilateral nares patent. Right nasal mucosal swelling and rhinorhea noted Right Ear: External ear normal.  Left Ear: External ear normal.  Eyes: Conjunctivae are normal. No scleral icterus.  Neck: Neck supple. Carotid bruit is not present.  Cardiovascular: Normal rate, regular rhythm and normal heart sounds.  Exam reveals no gallop and no  friction rub.   No murmur heard. Pulmonary/Chest: Effort normal and breath sounds normal. No respiratory distress. He has no wheezes. no rales.  Lymphadenopathy:    He has no cervical adenopathy.  Neurological:Alert.  Skin: Skin is warm and dry. Not diaphoretic.  Psychiatric: Has a normal mood and affect.        Assessment & Plan:

## 2011-10-15 NOTE — Assessment & Plan Note (Signed)
Obtain cxr. Attempt flonase. Followup if no improvement or worsening.

## 2011-10-19 ENCOUNTER — Ambulatory Visit (INDEPENDENT_AMBULATORY_CARE_PROVIDER_SITE_OTHER): Payer: Managed Care, Other (non HMO) | Admitting: Family Medicine

## 2011-10-19 VITALS — BP 157/78

## 2011-10-19 DIAGNOSIS — M1711 Unilateral primary osteoarthritis, right knee: Secondary | ICD-10-CM

## 2011-10-19 DIAGNOSIS — M1710 Unilateral primary osteoarthritis, unspecified knee: Secondary | ICD-10-CM

## 2011-10-20 NOTE — Progress Notes (Signed)
Knee Injection: Suparz #1, Right knee Patient verbally consented to procedure. Risks (including infection), benefits, and alternatives explained. Sterilely prepped with Chloraprep. Ethyl cholride used for anesthesia, then 5 cc of Lidocaine 1% used for anesthesia in the anterolateral position. Reprepped with Betadine.  Anterolateral approach without difficulty, injected with Synvisc-3, 2 mL. No complications with procedure and tolerated well.

## 2011-10-26 ENCOUNTER — Ambulatory Visit (INDEPENDENT_AMBULATORY_CARE_PROVIDER_SITE_OTHER): Payer: Managed Care, Other (non HMO) | Admitting: Family Medicine

## 2011-10-26 VITALS — BP 150/85

## 2011-10-26 DIAGNOSIS — IMO0002 Reserved for concepts with insufficient information to code with codable children: Secondary | ICD-10-CM

## 2011-10-26 DIAGNOSIS — M1710 Unilateral primary osteoarthritis, unspecified knee: Secondary | ICD-10-CM

## 2011-10-26 DIAGNOSIS — M1711 Unilateral primary osteoarthritis, right knee: Secondary | ICD-10-CM

## 2011-10-26 MED ORDER — TRAMADOL HCL 50 MG PO TABS: 50.0000 mg | ORAL_TABLET | Freq: Four times a day (QID) | ORAL | Status: AC | PRN

## 2011-10-26 NOTE — Patient Instructions (Signed)
F/u next week

## 2011-10-26 NOTE — Progress Notes (Signed)
Knee Injection: Suparz #2, R knee Patient verbally consented to procedure. Risks (including infection), benefits, and alternatives explained. Sterilely prepped with Chloraprep. Ethyl cholride used for anesthesia, then 5 cc of Lidocaine 1% used for anesthesia in the anterolateral position. Reprepped with Betadine.  Anterolateral approach without difficulty, injected with Suparz 2.5 mL. No complications with procedure and tolerated well.

## 2011-11-02 ENCOUNTER — Ambulatory Visit (INDEPENDENT_AMBULATORY_CARE_PROVIDER_SITE_OTHER): Payer: Managed Care, Other (non HMO) | Admitting: Family Medicine

## 2011-11-02 VITALS — BP 158/94

## 2011-11-02 DIAGNOSIS — M1711 Unilateral primary osteoarthritis, right knee: Secondary | ICD-10-CM

## 2011-11-02 DIAGNOSIS — M1710 Unilateral primary osteoarthritis, unspecified knee: Secondary | ICD-10-CM

## 2011-11-02 DIAGNOSIS — IMO0002 Reserved for concepts with insufficient information to code with codable children: Secondary | ICD-10-CM

## 2011-11-02 NOTE — Progress Notes (Signed)
Knee Injection: Supartz, #3 R knee Patient verbally consented to procedure. Risks (including infection), benefits, and alternatives explained. Sterilely prepped with Chloraprep. Ethyl cholride used for anesthesia, then 10 cc of Lidocaine 1% used for anesthesia in the anteromedial position. Reprepped with Chloraprep.  Anterolateral approach without difficulty, injected with Suparz 2.5 mL. No complications with procedure and tolerated well.

## 2011-11-24 ENCOUNTER — Ambulatory Visit: Payer: Managed Care, Other (non HMO)

## 2011-11-24 ENCOUNTER — Ambulatory Visit (INDEPENDENT_AMBULATORY_CARE_PROVIDER_SITE_OTHER)
Admission: RE | Admit: 2011-11-24 | Discharge: 2011-11-24 | Disposition: A | Discharge status: Home / Self Care | Payer: Managed Care, Other (non HMO) | Source: Ambulatory Visit | Attending: Internal Medicine | Admitting: Internal Medicine

## 2011-11-24 DIAGNOSIS — E119 Type 2 diabetes mellitus without complications: Secondary | ICD-10-CM

## 2011-11-24 DIAGNOSIS — E785 Hyperlipidemia, unspecified: Secondary | ICD-10-CM

## 2011-11-24 DIAGNOSIS — R05 Cough: Secondary | ICD-10-CM

## 2011-11-24 DIAGNOSIS — R059 Cough, unspecified: Secondary | ICD-10-CM

## 2011-11-24 LAB — BASIC METABOLIC PANEL
BUN: 16 mg/dL (ref 6–23)
Calcium: 9.3 mg/dL (ref 8.4–10.5)
GFR: 124.3 mL/min (ref >60.00)
Potassium: 4.1 mEq/L (ref 3.5–5.1)
Sodium: 139 mEq/L (ref 135–145)

## 2011-11-24 LAB — HEMOGLOBIN A1C: Hgb A1c MFr Bld: 6.4 % (ref 4.6–6.5)

## 2011-11-24 LAB — LIPID PANEL
HDL: 55.7 mg/dL (ref >39.00)
Triglycerides: 63 mg/dL (ref 0.0–149.0)
VLDL: 12.6 mg/dL (ref 0.0–40.0)

## 2011-11-24 LAB — HEPATIC FUNCTION PANEL
Albumin: 3.7 g/dL (ref 3.5–5.2)
Alkaline Phosphatase: 79 U/L (ref 39–117)
Total Protein: 7.6 g/dL (ref 6.0–8.3)

## 2011-11-26 ENCOUNTER — Telehealth: Payer: Self-pay | Admitting: *Deleted

## 2011-11-26 NOTE — Telephone Encounter (Signed)
Message copied by Elnora Morrison on Fri Nov 26, 2011  1:13 PM ------      Message from: Edwyna Perfect      Created: Thu Nov 25, 2011  9:03 AM       cxr show no pneumonia or mass.

## 2011-11-26 NOTE — Telephone Encounter (Signed)
Unable to reach patient at home and cell #. No voice mail set up. Work # patient not able to come to phone.

## 2011-11-26 NOTE — Telephone Encounter (Signed)
Called work #. Left message to return call. Called home and cell # not taking messages.

## 2011-11-26 NOTE — Telephone Encounter (Signed)
Message copied by Elnora Morrison on Fri Nov 26, 2011  3:22 PM ------      Message from: Edwyna Perfect      Created: Thu Nov 25, 2011  9:03 AM       cxr show no pneumonia or mass.

## 2011-11-29 ENCOUNTER — Encounter: Payer: Self-pay | Admitting: *Deleted

## 2011-11-29 NOTE — Telephone Encounter (Signed)
Patient call back; Informed pt of results & letter in mail/SLS

## 2011-12-02 ENCOUNTER — Encounter: Payer: Self-pay | Admitting: *Deleted

## 2011-12-02 DIAGNOSIS — E785 Hyperlipidemia, unspecified: Secondary | ICD-10-CM

## 2011-12-02 DIAGNOSIS — E119 Type 2 diabetes mellitus without complications: Secondary | ICD-10-CM

## 2011-12-03 ENCOUNTER — Telehealth: Payer: Self-pay | Admitting: *Deleted

## 2011-12-03 NOTE — Telephone Encounter (Signed)
Pt returned our call re: lab results and was notified per 12/02/11 letter. Pt scheduled f/u for 02/14/12 at 4pm and was reminded to return to the lab the week prior for bloodwork. Future lab orders previously placed were printed and given to the lab and mailed to pt as reminder.  Please schedule labs prior to your next appointment as well (cbc, chem7, a1c-250.0)

## 2012-01-06 ENCOUNTER — Other Ambulatory Visit: Payer: Self-pay | Admitting: Internal Medicine

## 2012-01-06 ENCOUNTER — Telehealth: Payer: Self-pay | Admitting: Internal Medicine

## 2012-01-06 NOTE — Telephone Encounter (Signed)
Is this ok to refill?... 01/06/12@4 :07pm/LMB

## 2012-01-06 NOTE — Telephone Encounter (Signed)
Refill- fluconazole 100mg  tab. Take one tablet by mouth every day for 14 days. Qty 14 last fill 5.18.59

## 2012-01-06 NOTE — Telephone Encounter (Signed)
Needs to contact office. Don't see on med list and need to know sx's anyway

## 2012-01-07 NOTE — Telephone Encounter (Signed)
Sent refill back denied pt need to appt... 01/07/12@9 :04am/LMB

## 2012-02-14 ENCOUNTER — Ambulatory Visit: Payer: Managed Care, Other (non HMO) | Admitting: Internal Medicine

## 2012-02-25 ENCOUNTER — Telehealth: Payer: Self-pay | Admitting: Internal Medicine

## 2012-02-25 ENCOUNTER — Encounter: Payer: Self-pay | Admitting: Internal Medicine

## 2012-02-25 ENCOUNTER — Ambulatory Visit (INDEPENDENT_AMBULATORY_CARE_PROVIDER_SITE_OTHER): Payer: Managed Care, Other (non HMO) | Admitting: Internal Medicine

## 2012-02-25 VITALS — BP 140/80 | HR 100 | Temp 98.0°F | Resp 18 | Wt 284.1 lb

## 2012-02-25 DIAGNOSIS — R059 Cough, unspecified: Secondary | ICD-10-CM

## 2012-02-25 DIAGNOSIS — E785 Hyperlipidemia, unspecified: Secondary | ICD-10-CM

## 2012-02-25 DIAGNOSIS — E119 Type 2 diabetes mellitus without complications: Secondary | ICD-10-CM

## 2012-02-25 DIAGNOSIS — N393 Stress incontinence (female) (male): Secondary | ICD-10-CM

## 2012-02-25 DIAGNOSIS — I10 Essential (primary) hypertension: Secondary | ICD-10-CM

## 2012-02-25 DIAGNOSIS — R05 Cough: Secondary | ICD-10-CM

## 2012-02-25 NOTE — Patient Instructions (Signed)
Please schedule fasting labs at the [facility] lab next week  Cbc, chem7, a1c-250.0 and lipid/lft-272.4

## 2012-02-25 NOTE — Progress Notes (Signed)
  Subjective:    Patient ID: Leah Hampton, female    DOB: Mar 28, 1956, 56 y.o.   MRN: [id number]  HPI Pt presents to clinic for followup of multiple medical problems. Previous chronic cough status post chest x-ray has resolved without recurrence. Blood pressure minimally elevated with previous normal result last visit. Notes chronic urinary incontinence with stress for at least one year.  Past Medical History  Diagnosis Date  . Obesity   . Cystitis   . Hyperbilirubinemia   . History of chest pain   . Hematuria     urologice eval, Dr Lindley Magnus  . Renal insufficiency   . Leukocytosis   . Osteoarthritis   . Hypertelorism   . Hyperlipemia    Past Surgical History  Procedure Date  . Knee arthroscopy 04/2007    left knee  . 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 She has never used smokeless tobacco. She reports that she does not drink alcohol or use illicit drugs. family history includes Arthritis in an unspecified family member; Coronary artery disease in an unspecified family member; Diabetes in an unspecified family member; Heart attack (age of onset:70) in her father; Heart failure in her mother and sister; and Stroke in an unspecified family member. Allergies  Allergen Reactions  . Sulfamethoxazole-Trimethoprim (Bactrim) Itching  . Codeine     REACTION: nausea and vomiting      Review of Systems see hpi     Objective:   Physical Exam  Physical Exam  Nursing note and vitals reviewed. Constitutional: Appears well-developed and well-nourished. No distress.  HENT:  Head: Normocephalic and atraumatic.  Right Ear: External ear normal.  Left Ear: External ear normal.  Eyes: Conjunctivae are normal. No scleral icterus.  Neck: Neck supple. Carotid bruit is not present.  Cardiovascular: Normal rate, regular rhythm and normal heart sounds.  Exam reveals no gallop and no friction rub.   No murmur heard. Pulmonary/Chest: Effort normal  and breath sounds normal. No respiratory distress. He has no wheezes. no rales.  Lymphadenopathy:    He has no cervical adenopathy.  Neurological:Alert.  Skin: Skin is warm and dry. Not diaphoretic.  Psychiatric: Has a normal mood and affect.        Assessment & Plan:

## 2012-02-28 ENCOUNTER — Telehealth: Payer: Self-pay | Admitting: *Deleted

## 2012-02-28 ENCOUNTER — Other Ambulatory Visit (INDEPENDENT_AMBULATORY_CARE_PROVIDER_SITE_OTHER): Payer: Managed Care, Other (non HMO)

## 2012-02-28 DIAGNOSIS — E119 Type 2 diabetes mellitus without complications: Secondary | ICD-10-CM

## 2012-02-28 DIAGNOSIS — E785 Hyperlipidemia, unspecified: Secondary | ICD-10-CM

## 2012-02-28 LAB — HEPATIC FUNCTION PANEL
Alkaline Phosphatase: 85 U/L (ref 39–117)
Bilirubin, Direct: 0.1 mg/dL (ref 0.0–0.3)
Total Bilirubin: 0.5 mg/dL (ref 0.3–1.2)
Total Protein: 7.3 g/dL (ref 6.0–8.3)

## 2012-02-28 LAB — BASIC METABOLIC PANEL
BUN: 11 mg/dL (ref 6–23)
CO2: 27 mEq/L (ref 19–32)
Calcium: 9 mg/dL (ref 8.4–10.5)
Creatinine, Ser: 0.7 mg/dL (ref 0.4–1.2)
GFR: 121.97 mL/min (ref >60.00)
Glucose, Bld: 90 mg/dL (ref 70–99)
Sodium: 142 mEq/L (ref 135–145)

## 2012-02-28 LAB — CBC WITH DIFFERENTIAL/PLATELET
Basophils Absolute: 0.1 10*3/uL (ref 0.0–0.1)
Basophils Relative: 0.7 % (ref 0.0–3.0)
Eosinophils Absolute: 0.1 10*3/uL (ref 0.0–0.7)
Lymphocytes Relative: 37.9 % (ref 12.0–46.0)
MCHC: 32.4 g/dL (ref 30.0–36.0)
MCV: 87.9 fl (ref 78.0–100.0)
Monocytes Absolute: 0.7 10*3/uL (ref 0.1–1.0)
Neutrophils Relative %: 53.7 % (ref 43.0–77.0)
Platelets: 291 10*3/uL (ref 150.0–400.0)
RBC: 4.39 Mil/uL (ref 3.87–5.11)
RDW: 16.4 % — ABNORMAL HIGH (ref 11.5–14.6)

## 2012-02-28 LAB — LIPID PANEL
HDL: 47.6 mg/dL (ref >39.00)
Total CHOL/HDL Ratio: 3
Triglycerides: 48 mg/dL (ref 0.0–149.0)
VLDL: 9.6 mg/dL (ref 0.0–40.0)

## 2012-02-28 NOTE — Telephone Encounter (Signed)
Future orders entered for Leah Hampton.

## 2012-03-04 DIAGNOSIS — N393 Stress incontinence (female) (male): Secondary | ICD-10-CM | POA: Insufficient documentation

## 2012-03-04 NOTE — Assessment & Plan Note (Signed)
resolved 

## 2012-03-04 NOTE — Assessment & Plan Note (Signed)
Recommend [place] exercises.

## 2012-03-04 NOTE — Assessment & Plan Note (Signed)
Minimal elevation. Recommend outpatient blood pressure log to be reviewed

## 2012-03-23 NOTE — Telephone Encounter (Signed)
Future labs entered per order below.  Please schedule fasting labs at the [facility] lab next week  Cbc, chem7, a1c-250.0 and lipid/lft-272.4

## 2012-05-15 ENCOUNTER — Other Ambulatory Visit: Payer: Self-pay | Admitting: *Deleted

## 2012-05-15 MED ORDER — PRAVASTATIN SODIUM 40 MG PO TABS: 40.0000 mg | ORAL_TABLET | Freq: Every day | ORAL | Status: DC

## 2012-05-15 NOTE — Telephone Encounter (Signed)
Received message from pt stating she needed a refill on her medication and we should know which one she needs because she's only seen [place] once. Upon review of medication list I see that refills have been sent previously for flonase and pravastatin. It appears that refills on both have expired. Left detailed message for pt to return my call with named of medication to be refilled.

## 2012-05-15 NOTE — Telephone Encounter (Signed)
Pt returned call requesting refill on "cholesterol medication"; Rx to pharmacy/SLS

## 2012-05-23 ENCOUNTER — Encounter: Payer: Self-pay | Admitting: Family

## 2012-05-23 ENCOUNTER — Ambulatory Visit: Payer: Managed Care, Other (non HMO) | Admitting: Internal Medicine

## 2012-05-23 ENCOUNTER — Ambulatory Visit (INDEPENDENT_AMBULATORY_CARE_PROVIDER_SITE_OTHER): Payer: 59 | Admitting: Family

## 2012-05-23 VITALS — BP 150/80 | HR 99 | Temp 98.8°F | Resp 16 | Ht 62.5 in | Wt 281.1 lb

## 2012-05-23 DIAGNOSIS — M1710 Unilateral primary osteoarthritis, unspecified knee: Secondary | ICD-10-CM

## 2012-05-23 DIAGNOSIS — B372 Candidiasis of skin and nail: Secondary | ICD-10-CM

## 2012-05-23 DIAGNOSIS — IMO0002 Reserved for concepts with insufficient information to code with codable children: Secondary | ICD-10-CM

## 2012-05-23 DIAGNOSIS — R7309 Other abnormal glucose: Secondary | ICD-10-CM

## 2012-05-23 DIAGNOSIS — I10 Essential (primary) hypertension: Secondary | ICD-10-CM

## 2012-05-23 DIAGNOSIS — M1711 Unilateral primary osteoarthritis, right knee: Secondary | ICD-10-CM

## 2012-05-23 DIAGNOSIS — E785 Hyperlipidemia, unspecified: Secondary | ICD-10-CM

## 2012-05-23 DIAGNOSIS — A6000 Herpesviral infection of urogenital system, unspecified: Secondary | ICD-10-CM | POA: Insufficient documentation

## 2012-05-23 MED ORDER — LISINOPRIL 10 MG PO TABS: 10.0000 mg | ORAL_TABLET | Freq: Every day | ORAL | Status: DC

## 2012-05-23 MED ORDER — NYSTATIN 100000 UNIT/GM EX CREA: TOPICAL_CREAM | CUTANEOUS | Status: DC

## 2012-05-23 MED ORDER — VALACYCLOVIR HCL 500 MG PO TABS: ORAL_TABLET | ORAL | Status: DC

## 2012-05-23 NOTE — Assessment & Plan Note (Signed)
[place]- trial of nystatin cream

## 2012-05-23 NOTE — Assessment & Plan Note (Signed)
BP Readings from Last 3 Encounters:  05/23/12 150/80  02/25/12 140/80  11/02/11 158/94   Deteriorated here in office.  Will add lisinopril.  She was cautioned re: rare angioedema reaction and to go to ED if this occurs.  Plan follow up in 1 month for BP check and bmet.

## 2012-05-23 NOTE — Assessment & Plan Note (Signed)
Refill prn valtrex.

## 2012-05-23 NOTE — Assessment & Plan Note (Signed)
Continue ASA, A1C is ordered to be drawn.

## 2012-05-23 NOTE — Progress Notes (Signed)
Subjective:    Patient ID: Leah Hampton, female    DOB: November 22, 1955, 57 y.o.   MRN: [id number]  HPI  [name] is a 57 yr old female who presents today for follow up.  1) HTN-  She has been monitoring her blood pressure at home.  Reports better readings at home than she has had here.    2) DM2- borderline-  Last A1C < 6.5.  3) Hyperlipidemia- she continues statin, denies myalgia.    4) DJD- was seeing Kerin Perna- reports that she has had several steroid injections to the right knee without improvement in her symptoms.  Has had left TKR- done by Dr Madelon Lips.  Most recent x ray of the right knee per chart review notes:  Moderate to advanced tricompartmental degenerative changes  5) Rash- reports itching rash in naval.  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  Review of Systems See HPI  Past Medical History  Diagnosis Date  . Obesity   . Cystitis   . Hyperbilirubinemia   . History of chest pain   . Hematuria     urologice eval, Dr Lindley Magnus  . Renal insufficiency   . Leukocytosis   . Osteoarthritis   . Hypertelorism   . Hyperlipemia     History   Social History  . Marital Status: Single    Spouse Name: N/A    Number of Children: 2  . Years of Education: N/A   Occupational History  . Cook     at [employer]   Social History Main Topics  . Smoking status: Never Smoker   . Smokeless tobacco: Never Used  . Alcohol Use: No  . Drug Use: No  . Sexually Active: Not on file   Other Topics Concern  . Not on file   Social History Narrative   Regular exercise:  No    Past Surgical History  Procedure Date  . Knee arthroscopy 04/2007    left knee  . 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 Diabetes    . Arthritis    . Stroke    . Coronary artery disease    . Heart failure Sister     x 2  . Heart failure Mother   . Heart attack Father 38      Allergies  Allergen Reactions  . Sulfamethoxazole-Trimethoprim (Bactrim) Itching  . Codeine     REACTION: nausea and vomiting    Current Outpatient Prescriptions on File Prior to Visit  Medication Sig Dispense Refill  . aspirin 81 MG tablet Take 81 mg by mouth daily.        . pravastatin (PRAVACHOL) 40 MG tablet Take 1 tablet (40 mg total) by mouth daily.  90 tablet  1  . diclofenac sodium (VOLTAREN) 1 % GEL Apply 1 application topically 4 (four) times daily.  5 Tube  prn  . fluconazole (DIFLUCAN) 100 MG tablet Take 100 mg by mouth daily.      . fluticasone (FLONASE) 50 MCG/ACT nasal spray Place 2 sprays into the nose daily.  16 g  6  . lisinopril (PRINIVIL,ZESTRIL) 10 MG tablet Take 1 tablet (10 mg total) by mouth daily.  90 tablet  3    BP 150/80  Pulse 99  Temp 98.8 F (37.1 C) (Oral)  Resp 16  Ht 5' 2.5" (1.588 m)  [employer]  281 lb 1.9 oz (127.515 kg)  BMI 50.60 kg/m2  SpO2 98%  LMP 11/10/2011       Objective:   Physical Exam  Constitutional: She appears well-developed and well-nourished. No distress.  Cardiovascular: Normal rate and regular rhythm.   No murmur heard. Pulmonary/Chest: Effort normal and breath sounds normal. No respiratory distress. She has no wheezes. She has no rales. She exhibits no tenderness.  Musculoskeletal: She exhibits no edema.  Skin:       Candidal rash noted inside umbilicus/skin fold.   Psychiatric: She has a normal mood and affect. Her behavior is normal. Judgment and thought content normal.          Assessment & Plan:

## 2012-05-23 NOTE — Assessment & Plan Note (Signed)
Recommended that she arrange follow up apt with Dr. Madelon Lips to discuss if R TKR is indicated at this time.

## 2012-05-23 NOTE — Assessment & Plan Note (Signed)
Continues statin, she will have FLP drawn at [facility] lab.

## 2012-05-23 NOTE — Patient Instructions (Addendum)
Please complete your blood work at [facility]. Call Dr. Candise Bowens office to arrange follow up for your right knee. Follow up with Dr. Rodena Medin in 1 month.

## 2012-06-05 ENCOUNTER — Encounter: Payer: Self-pay | Admitting: Gastroenterology

## 2012-06-16 ENCOUNTER — Encounter: Payer: Self-pay | Admitting: Internal Medicine

## 2012-06-29 ENCOUNTER — Ambulatory Visit: Payer: 59 | Admitting: Internal Medicine

## 2012-12-12 ENCOUNTER — Encounter: Payer: Self-pay | Admitting: Gastroenterology

## 2013-05-16 ENCOUNTER — Other Ambulatory Visit: Payer: Self-pay | Admitting: Internal Medicine

## 2013-05-18 NOTE — Telephone Encounter (Signed)
Rx request to pharmacy/SLS **PATIENT MUST HAVE OFFICE VISIT PRIOR TO FUTURE REFILLS, 30-day supply ONLY**

## 2013-08-17 ENCOUNTER — Other Ambulatory Visit: Payer: Self-pay | Admitting: Family

## 2013-08-17 DIAGNOSIS — Z1231 Encounter for screening mammogram for malignant neoplasm of breast: Secondary | ICD-10-CM

## 2013-09-07 ENCOUNTER — Encounter: Payer: Self-pay | Admitting: Family

## 2013-09-07 ENCOUNTER — Ambulatory Visit (HOSPITAL_BASED_OUTPATIENT_CLINIC_OR_DEPARTMENT_OTHER)
Admission: RE | Admit: 2013-09-07 | Discharge: 2013-09-07 | Disposition: A | Discharge status: Home / Self Care | Payer: 59 | Source: Ambulatory Visit | Attending: Family | Admitting: Family

## 2013-09-07 ENCOUNTER — Ambulatory Visit (INDEPENDENT_AMBULATORY_CARE_PROVIDER_SITE_OTHER): Payer: 59 | Admitting: Family

## 2013-09-07 VITALS — BP 160/90 | HR 84 | Temp 97.9°F | Resp 18 | Ht 63.0 in | Wt 287.1 lb

## 2013-09-07 DIAGNOSIS — R042 Hemoptysis: Secondary | ICD-10-CM | POA: Insufficient documentation

## 2013-09-07 DIAGNOSIS — M1990 Unspecified osteoarthritis, unspecified site: Secondary | ICD-10-CM

## 2013-09-07 DIAGNOSIS — R7309 Other abnormal glucose: Secondary | ICD-10-CM

## 2013-09-07 DIAGNOSIS — Z1231 Encounter for screening mammogram for malignant neoplasm of breast: Secondary | ICD-10-CM | POA: Insufficient documentation

## 2013-09-07 DIAGNOSIS — E785 Hyperlipidemia, unspecified: Secondary | ICD-10-CM

## 2013-09-07 DIAGNOSIS — R739 Hyperglycemia, unspecified: Secondary | ICD-10-CM

## 2013-09-07 DIAGNOSIS — R0989 Other specified symptoms and signs involving the circulatory and respiratory systems: Secondary | ICD-10-CM | POA: Insufficient documentation

## 2013-09-07 DIAGNOSIS — I10 Essential (primary) hypertension: Secondary | ICD-10-CM

## 2013-09-07 DIAGNOSIS — J209 Acute bronchitis, unspecified: Secondary | ICD-10-CM

## 2013-09-07 MED ORDER — LISINOPRIL 10 MG PO TABS: 10.0000 mg | ORAL_TABLET | Freq: Every day | ORAL | Status: DC

## 2013-09-07 MED ORDER — TRAMADOL HCL 50 MG PO TABS: 50.0000 mg | ORAL_TABLET | Freq: Four times a day (QID) | ORAL | Status: DC | PRN

## 2013-09-07 MED ORDER — AZITHROMYCIN 250 MG PO TABS: ORAL_TABLET | ORAL | Status: DC

## 2013-09-07 MED ORDER — PRAVASTATIN SODIUM 40 MG PO TABS: ORAL_TABLET | ORAL | Status: DC

## 2013-09-07 NOTE — Progress Notes (Signed)
Pre visit review using our clinic review tool, if applicable. No additional management support is needed unless otherwise documented below in the visit note. 

## 2013-09-07 NOTE — Assessment & Plan Note (Signed)
Obtain follow up A1C. Advised pt to schedule a follow up eye exam.

## 2013-09-07 NOTE — Assessment & Plan Note (Signed)
Knees.  Refill provided for prn tramadol. Controlled substance contract is signed.

## 2013-09-07 NOTE — Assessment & Plan Note (Signed)
Rx with zithromax. CXR negative for acute changes.

## 2013-09-07 NOTE — Patient Instructions (Addendum)
Download My Fitness Pal App to your phone and set up a program with goal weight loss of 1-2 pounds a week. Make sure to get regular exercise- goal in 30 minutes 5 days a week. Complete blood work prior to leaving. Restart meds. Start zithromax for bronchitis and complete chest x ray on the first floor. Call if chest congestion/blood streaking in phlegm worsen, or if not resolved in 1 week. Follow up in 1 month for physical.

## 2013-09-07 NOTE — Assessment & Plan Note (Signed)
Resume statin, obtain flp/lft.  Expect lipids to be elevated.

## 2013-09-07 NOTE — Assessment & Plan Note (Signed)
Uncontrolled. Resume lisinopril.

## 2013-09-07 NOTE — Progress Notes (Signed)
Subjective:    Patient ID: Leah Hampton, female    DOB: Nov 21, 1955, 58 y.o.   MRN: [id number]  HPI  Leah Hampton is a 58 yr old female who presents today for follow up of multiple medical problems:  1) HTN- current BP meds include lisinopril.  Has been out of BP medication.    BP Readings from Last 3 Encounters:  09/07/13 160/90  05/23/12 150/80  02/25/12 140/80   2) DM2- borderline.  Last eye exam 2013.   Lab Results  Component Value Date   HGBA1C 6.2 02/28/2012   3) Hyperlipidemia- on pravastatin- but ran out a few months ago.    4) chest congestion- notes chest congestion for several months, occasional streaks of blood in sputum.  5) Knee pain- uses tramadol prn. Doesn't use every day.  Review of Systems See HPI  Past Medical History  Diagnosis Date  . Obesity   . Cystitis   . Hyperbilirubinemia   . History of chest pain   . Hematuria     urologice eval, Dr Estill Dooms  . Renal insufficiency   . Leukocytosis   . Osteoarthritis   . Hypertelorism   . Hyperlipemia     History   Social History  . Marital Status: Single    Spouse Name: N/A    Number of Children: 2  . 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 Smoking status: Never Smoker   . Smokeless tobacco: Never Used  . Alcohol Use: No  . Drug Use: No  . Sexual Activity: Not on file   Other Topics Concern  . Not on file   Social History Narrative   Regular exercise:  No          Past Surgical History  Procedure Laterality Date  . Knee arthroscopy  04/2007    left knee  . 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 Diabetes    . Arthritis    . Stroke    . Coronary artery disease    . Heart failure Sister     x 2  . Heart failure Mother   . Heart attack Father 14    Allergies  Allergen Reactions  . Sulfamethoxazole-Trimethoprim [Bactrim] Itching   . Codeine     REACTION: nausea and vomiting    Current Outpatient Prescriptions on File Prior to Visit  Medication Sig Dispense Refill  . aspirin 81 MG tablet Take 81 mg by mouth daily.        . valACYclovir (VALTREX) 500 MG tablet One tablet by mouth twice daily for 3 days at start of breakout.  30 tablet  1   No current facility-administered medications on file prior to visit.    BP 160/90  Pulse 84  Temp(Src) 97.9 F (36.6 C) (Oral)  Resp 18  Ht 5\' 3"  (1.6 m)  Wt 287 lb 1.9 oz (130.237 kg)  BMI 50.87 kg/m2  SpO2 98%  LMP 11/10/2011       Objective:   Physical Exam  Constitutional: She is oriented to person, place, and time. She appears well-developed and well-nourished. No distress.  HENT:  Head: Normocephalic and atraumatic.  Cardiovascular: Normal rate and regular rhythm.   No murmur heard. Pulmonary/Chest: Effort normal and breath sounds normal. No respiratory distress. She has  no wheezes. She has no rales. She exhibits no tenderness.  Musculoskeletal: She exhibits no edema.  Lymphadenopathy:    She has no cervical adenopathy.  Neurological: She is alert and oriented to person, place, and time.  Psychiatric: She has a normal mood and affect. Her behavior is normal. Judgment and thought content normal.          Assessment & Plan:

## 2013-09-08 LAB — HEPATIC FUNCTION PANEL
ALBUMIN: 3.8 g/dL (ref 3.5–5.2)
ALT: 22 U/L (ref 0–35)
AST: 24 U/L (ref 0–37)
Alkaline Phosphatase: 95 U/L (ref 39–117)
Bilirubin, Direct: <0.1 mg/dL (ref 0.0–0.3)
TOTAL PROTEIN: 6.8 g/dL (ref 6.0–8.3)
Total Bilirubin: 0.3 mg/dL (ref 0.2–1.2)

## 2013-09-08 LAB — LIPID PANEL
Cholesterol: 210 mg/dL — ABNORMAL HIGH (ref 0–200)
HDL: 56 mg/dL (ref >39)
LDL Cholesterol: 141 mg/dL — ABNORMAL HIGH (ref 0–99)
Total CHOL/HDL Ratio: 3.8 Ratio
Triglycerides: 63 mg/dL (ref <150)
VLDL: 13 mg/dL (ref 0–40)

## 2013-09-08 LAB — BASIC METABOLIC PANEL WITH GFR
BUN: 13 mg/dL (ref 6–23)
CALCIUM: 9.1 mg/dL (ref 8.4–10.5)
CO2: 31 mEq/L (ref 19–32)
Chloride: 105 mEq/L (ref 96–112)
Creat: 0.64 mg/dL (ref 0.50–1.10)
GLUCOSE: 80 mg/dL (ref 70–99)
Potassium: 4.2 mEq/L (ref 3.5–5.3)
SODIUM: 141 meq/L (ref 135–145)

## 2013-09-08 LAB — HEMOGLOBIN A1C
Hgb A1c MFr Bld: 6.2 % — ABNORMAL HIGH (ref <5.7)
Mean Plasma Glucose: 131 mg/dL — ABNORMAL HIGH (ref <117)

## 2013-09-11 ENCOUNTER — Telehealth: Payer: Self-pay | Admitting: Family

## 2013-09-11 ENCOUNTER — Encounter: Payer: Self-pay | Admitting: Family

## 2013-09-11 NOTE — Telephone Encounter (Signed)
See lab letter. 

## 2013-10-02 ENCOUNTER — Encounter: Payer: 59 | Admitting: Family

## 2013-10-09 ENCOUNTER — Other Ambulatory Visit: Payer: Self-pay | Admitting: Family

## 2013-10-10 NOTE — Telephone Encounter (Signed)
Pt left message requesting refill of pravastatin. Refill sent. Pt is due for physical. Please call pt to arrange CPE.

## 2013-10-10 NOTE — Telephone Encounter (Signed)
Left detailed message informing patient of medication refill and that she is due for her cpe and to call our office to schedule appointment

## 2013-10-12 NOTE — Telephone Encounter (Signed)
Left message for patient to return my call.

## 2013-10-14 ENCOUNTER — Encounter (HOSPITAL_COMMUNITY): Payer: Self-pay | Admitting: Emergency Medicine

## 2013-10-14 ENCOUNTER — Emergency Department (HOSPITAL_COMMUNITY)
Admission: EM | Admit: 2013-10-14 | Discharge: 2013-10-15 | Disposition: A | Discharge status: Home / Self Care | Payer: 59 | Attending: Emergency Medicine | Admitting: Emergency Medicine

## 2013-10-14 DIAGNOSIS — Z862 Personal history of diseases of the blood and blood-forming organs and certain disorders involving the immune mechanism: Secondary | ICD-10-CM | POA: Insufficient documentation

## 2013-10-14 DIAGNOSIS — J029 Acute pharyngitis, unspecified: Secondary | ICD-10-CM | POA: Insufficient documentation

## 2013-10-14 DIAGNOSIS — Z791 Long term (current) use of non-steroidal anti-inflammatories (NSAID): Secondary | ICD-10-CM | POA: Insufficient documentation

## 2013-10-14 DIAGNOSIS — J209 Acute bronchitis, unspecified: Secondary | ICD-10-CM | POA: Insufficient documentation

## 2013-10-14 DIAGNOSIS — IMO0002 Reserved for concepts with insufficient information to code with codable children: Secondary | ICD-10-CM | POA: Insufficient documentation

## 2013-10-14 DIAGNOSIS — Z7982 Long term (current) use of aspirin: Secondary | ICD-10-CM | POA: Insufficient documentation

## 2013-10-14 DIAGNOSIS — J40 Bronchitis, not specified as acute or chronic: Secondary | ICD-10-CM

## 2013-10-14 DIAGNOSIS — E785 Hyperlipidemia, unspecified: Secondary | ICD-10-CM | POA: Insufficient documentation

## 2013-10-14 DIAGNOSIS — E669 Obesity, unspecified: Secondary | ICD-10-CM | POA: Insufficient documentation

## 2013-10-14 DIAGNOSIS — Z792 Long term (current) use of antibiotics: Secondary | ICD-10-CM | POA: Insufficient documentation

## 2013-10-14 DIAGNOSIS — Z8742 Personal history of other diseases of the female genital tract: Secondary | ICD-10-CM | POA: Insufficient documentation

## 2013-10-14 DIAGNOSIS — M1990 Unspecified osteoarthritis, unspecified site: Secondary | ICD-10-CM | POA: Insufficient documentation

## 2013-10-14 NOTE — ED Notes (Signed)
Pt complains of a cough for two weeks, sometimes its productive

## 2013-10-15 ENCOUNTER — Emergency Department (HOSPITAL_COMMUNITY): Payer: 59

## 2013-10-15 MED ORDER — ALBUTEROL SULFATE (2.5 MG/3ML) 0.083% IN NEBU
2.5000 mg | INHALATION_SOLUTION | Freq: Once | RESPIRATORY_TRACT | Status: AC
  Administered 2013-10-15: 2.5 mg via RESPIRATORY_TRACT
  Filled 2013-10-15: qty 3

## 2013-10-15 MED ORDER — BENZONATATE 100 MG PO CAPS
100.0000 mg | ORAL_CAPSULE | Freq: Once | ORAL | Status: AC
  Administered 2013-10-15: 100 mg via ORAL
  Filled 2013-10-15: qty 1

## 2013-10-15 MED ORDER — ALBUTEROL SULFATE HFA 108 (90 BASE) MCG/ACT IN AERS
2.0000 | INHALATION_SPRAY | Freq: Once | RESPIRATORY_TRACT | Status: AC
  Administered 2013-10-15: 2 via RESPIRATORY_TRACT
  Filled 2013-10-15: qty 6.7

## 2013-10-15 MED ORDER — HYDROCODONE-HOMATROPINE 5-1.5 MG/5ML PO SYRP: 5.0000 mL | ORAL_SOLUTION | Freq: Four times a day (QID) | ORAL | Status: DC | PRN

## 2013-10-15 MED ORDER — PREDNISONE 20 MG PO TABS
60.0000 mg | ORAL_TABLET | Freq: Once | ORAL | Status: AC
  Administered 2013-10-15: 60 mg via ORAL
  Filled 2013-10-15: qty 3

## 2013-10-15 MED ORDER — IPRATROPIUM-ALBUTEROL 0.5-2.5 (3) MG/3ML IN SOLN
3.0000 mL | Freq: Once | RESPIRATORY_TRACT | Status: AC
  Administered 2013-10-15: 3 mL via RESPIRATORY_TRACT
  Filled 2013-10-15: qty 3

## 2013-10-15 MED ORDER — PREDNISONE 20 MG PO TABS: 40.0000 mg | ORAL_TABLET | Freq: Every day | ORAL | Status: DC

## 2013-10-15 NOTE — Discharge Instructions (Signed)
Recommend prednisone as prescribed. Take Hycodan as needed for sever cough. Do not drive after taking this medication. You may also try over the counter cough remedies as needed. Use an albuterol inhaler, 2 puffs every 4 hours, as needed for shortness of breath. Follow up with your primary care doctor.  Bronchitis Bronchitis is inflammation of the airways that extend from the windpipe into the lungs (bronchi). The inflammation often causes mucus to develop, which leads to a cough. If the inflammation becomes severe, it may cause shortness of breath. CAUSES  Bronchitis may be caused by:   Viral infections.   Bacteria.   Cigarette smoke.   Allergens, pollutants, and other irritants.  SIGNS AND SYMPTOMS  The most common symptom of bronchitis is a frequent cough that produces mucus. Other symptoms include:  Fever.   Body aches.   Chest congestion.   Chills.   Shortness of breath.   Sore throat.  DIAGNOSIS  Bronchitis is usually diagnosed through a medical history and physical exam. Tests, such as chest X-rays, are sometimes done to rule out other conditions.  TREATMENT  You may need to avoid contact with whatever caused the problem (smoking, for example). Medicines are sometimes needed. These may include:  Antibiotics. These may be prescribed if the condition is caused by bacteria.  Cough suppressants. These may be prescribed for relief of cough symptoms.   Inhaled medicines. These may be prescribed to help open your airways and make it easier for you to breathe.   Steroid medicines. These may be prescribed for those with recurrent (chronic) bronchitis. HOME CARE INSTRUCTIONS  Get plenty of rest.   Drink enough fluids to keep your urine clear or pale yellow (unless you have a medical condition that requires fluid restriction). Increasing fluids may help thin your secretions and will prevent dehydration.   Only take over-the-counter or prescription medicines as  directed by your health care provider.  Only take antibiotics as directed. Make sure you finish them even if you start to feel better.  Avoid secondhand smoke, irritating chemicals, and strong fumes. These will make bronchitis worse. If you are a smoker, quit smoking. Consider using nicotine gum or skin patches to help control withdrawal symptoms. Quitting smoking will help your lungs heal faster.   Put a cool-mist humidifier in your bedroom at night to moisten the air. This may help loosen mucus. Change the water in the humidifier daily. You can also run the hot water in your shower and sit in the bathroom with the door closed for 5 10 minutes.   Follow up with your health care provider as directed.   Wash your hands frequently to avoid catching bronchitis again or spreading an infection to others.  SEEK MEDICAL CARE IF: Your symptoms do not improve after 1 week of treatment.  SEEK IMMEDIATE MEDICAL CARE IF:  Your fever increases.  You have chills.   You have chest pain.   You have worsening shortness of breath.   You have bloody sputum.  You faint.  You have lightheadedness.  You have a severe headache.   You vomit repeatedly. MAKE SURE YOU:   Understand these instructions.  Will watch your condition.  Will get help right away if you are not doing well or get worse. Document Released: 05/03/2005 Document Revised: 02/21/2013 Document Reviewed: 12/26/2012 [facility] Patient Information 2014 [place].

## 2013-10-15 NOTE — ED Provider Notes (Signed)
CSN: [id number]     Arrival date & time 10/14/13  2311 History   First MD Initiated Contact with Patient 10/15/13 0126     Chief Complaint  Patient presents with  . Cough    (Consider location/radiation/quality/duration/timing/severity/associated sxs/prior Treatment) HPI Comments: Cough x 2 weeks. Progressively worsening. Not adequately controlled with OTC medications. No fever, syncope, hemoptysis, SOB, CP, DOE, recent surgeries or hospitalizations, travel, or known sick contacts. Patient is on Lisinopril, but states she has been on this medication for "years".  Patient is a 58 y.o. female presenting with cough. The history is provided by the patient. No language interpreter was used.  Cough Cough characteristics:  Dry and productive (intermittently productive) Sputum characteristics:  Yellow and clear Severity:  Moderate Onset quality:  Gradual Duration:  2 weeks Timing:  Intermittent Progression:  Waxing and waning Chronicity:  New Smoker: no   Context: not sick contacts   Relieved by: Delsym with mild improvement. Worsened by:  Deep breathing and activity Ineffective treatments: Alka seltzer. Associated symptoms: sore throat and wheezing   Associated symptoms: no chest pain, no fever, no myalgias, no rhinorrhea and no shortness of breath   Risk factors: no recent infection and no recent travel     Past Medical History  Diagnosis Date  . Obesity   . Cystitis   . Hyperbilirubinemia   . History of chest pain   . Hematuria     urologice eval, Dr Estill Dooms  . Renal insufficiency   . Leukocytosis   . Osteoarthritis   . Hypertelorism   . Hyperlipemia    Past Surgical History  Procedure Laterality Date  . Knee arthroscopy  04/2007    left knee  . 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 Diabetes    . Arthritis    . Stroke    . Coronary artery disease    . Heart failure Sister     x 2  . Heart failure Mother    . Heart attack Father 20   History  Substance Use Topics  . Smoking status: Never Smoker   . Smokeless tobacco: Never Used  . Alcohol Use: No   OB History   Grav Para Term Preterm Abortions TAB SAB Ect Mult Living                  Review of Systems  Constitutional: Negative for fever.  HENT: Positive for sore throat. Negative for congestion, drooling, rhinorrhea and trouble swallowing.   Respiratory: Positive for cough, chest tightness and wheezing. Negative for shortness of breath.        No hemoptysis  Cardiovascular: Negative for chest pain.  Gastrointestinal: Negative for nausea and vomiting.  Musculoskeletal: Negative for myalgias.  Neurological: Negative for syncope, weakness and numbness.  All other systems reviewed and are negative.     Allergies  Sulfamethoxazole-trimethoprim and Codeine  Home Medications   Prior to Admission medications   Medication Sig Start Date End Date Taking? Authorizing Provider  aspirin 81 MG tablet Take 81 mg by mouth daily.     Yes Historical Provider, MD  dextromethorphan (DELSYM) 30 MG/5ML liquid Take 15 mg by mouth 2 (two) times daily as needed for cough.   Yes Historical Provider, MD  Dextromethorphan-Guaifenesin (MUCUS RELIEF DM MAX) 5-100 MG/5ML LIQD Take 30 mLs by mouth every 6 (six) hours as needed (cough).   Yes Historical Provider, MD  lisinopril (PRINIVIL,ZESTRIL)  10 MG tablet Take 1 tablet (10 mg total) by mouth daily. 09/07/13  Yes Debbrah Alar, NP  Phenyleph-CPM-DM-Aspirin (ALKA-SELTZER PLUS COLD & COUGH) 7.12-16-08-325 MG TBEF Take 2 tablets by mouth every 6 (six) hours as needed (cough).   Yes Historical Provider, MD  pravastatin (PRAVACHOL) 40 MG tablet Take 40 mg by mouth daily.   Yes Historical Provider, MD  traMADol (ULTRAM) 50 MG tablet Take 50 mg by mouth every 6 (six) hours as needed for moderate pain.   Yes Historical Provider, MD  azithromycin (ZITHROMAX) 250 MG tablet 2 tabs by mouth today, then one tab by  mouth daily for 4 more days. 09/07/13   Debbrah Alar, NP  diclofenac (VOLTAREN) 75 MG EC tablet Take 75 mg by mouth 2 (two) times daily.    Historical Provider, MD  HYDROcodone-homatropine (HYCODAN) 5-1.5 MG/5ML syrup Take 5 mLs by mouth every 6 (six) hours as needed for cough. 10/15/13   Antonietta Breach, PA-C  predniSONE (DELTASONE) 20 MG tablet Take 2 tablets (40 mg total) by mouth daily. 10/15/13   Antonietta Breach, PA-C  valACYclovir (VALTREX) 500 MG tablet One tablet by mouth twice daily for 3 days at start of breakout. 05/23/12   Debbrah Alar, NP   BP 140/86  Pulse 100  Temp(Src) 98.3 F (36.8 C) (Oral)  Resp 20  Ht 5\' 4"  (1.626 m)  Wt 284 lb (128.822 kg)  BMI 48.72 kg/m2  SpO2 100%  LMP 11/10/2011  Physical Exam  Nursing note and vitals reviewed. Constitutional: She is oriented to person, place, and time. She appears well-developed and well-nourished. No distress.  Nontoxic/nonseptic appearing.  HENT:  Head: Normocephalic and atraumatic.  Mouth/Throat: Oropharynx is clear and moist. No oropharyngeal exudate.  Eyes: Conjunctivae and EOM are normal. Pupils are equal, round, and reactive to light. No scleral icterus.  Neck: Normal range of motion. Neck supple.  Mild sporadic stridor with worsening cough only; no stridor at baseline.  Cardiovascular: Regular rhythm and normal heart sounds.   Borderline tachycardia. HR 95-105 while in exam room.  Pulmonary/Chest: Effort normal. No respiratory distress. She has wheezes (Mild diffuse expiratory). She has no rales. She exhibits no tenderness.  Dry nonproductive cough appreciated at bedside. No retractions or accessory muscle use.  Abdominal: Soft. She exhibits no distension. There is no tenderness. There is no rebound and no guarding.  Soft obese abdomen  Musculoskeletal: Normal range of motion.  Neurological: She is alert and oriented to person, place, and time.  GCS 15. Patient moves extremities without ataxia.  Skin: Skin is warm  and dry. No rash noted. She is not diaphoretic. No erythema. No pallor.  Psychiatric: She has a normal mood and affect. Her behavior is normal.    ED Course  Procedures (including critical care time) Labs Review Labs Reviewed - No data to display  Imaging Review Dg Chest 2 View  10/15/2013   CLINICAL DATA:  Cough and congestion for 1 week  EXAM: CHEST  2 VIEW  COMPARISON:  09/07/2013  FINDINGS: No cardiomegaly. Stable mild aortic tortuosity. No consolidation, edema, effusion, or pneumothorax.  IMPRESSION: No active cardiopulmonary disease.   Electronically Signed   By: Jorje Guild M.D.   On: 10/15/2013 01:13     EKG Interpretation None      MDM   Final diagnoses:  Bronchitis    Patient presents for cough x2 weeks with associated sore throat and chest tightness. Symptoms consistent with bronchitis. X-ray today shows no evidence of focal consolidation or pneumonia.  Patient denies fever as well as shortness of breath. No hypoxia in ED. Patient has been on Lisinopril which, I have discussed, may cause chronic cough; however, patient states she has been on this medication for "years". Symptoms treated with DuoNeb and steroids as well as Tessalon. Patient endorses mild to moderate improvement in her symptoms. Will discharge with 5 day course of steroids, albuterol inhaler and hycodan for cough. PCP f/u advised and return precautions provided. Patient agreeable to plan with no unaddressed concerns.   Filed Vitals:   10/14/13 2349 10/15/13 0156 10/15/13 0219 10/15/13 0341  BP: 150/84  143/66 140/86  Pulse: 109  101 100  Temp: 98.3 F (36.8 C)     TempSrc: Oral     Resp: 22  20   Height: 5\' 4"  (1.626 m)     Weight: 284 lb (128.822 kg)     SpO2: 98% 97% 100% 100%     Antonietta Breach, PA-C 10/15/13 0805

## 2013-10-16 NOTE — Telephone Encounter (Signed)
Letter mailed

## 2013-10-16 NOTE — Telephone Encounter (Signed)
Left message for patient to return my call.

## 2013-10-17 NOTE — ED Provider Notes (Signed)
Medical screening examination/treatment/procedure(s) were performed by non-physician practitioner and as supervising physician I was immediately available for consultation/collaboration.   EKG Interpretation None       Varney Biles, MD 10/17/13 0302

## 2013-11-05 ENCOUNTER — Ambulatory Visit: Payer: 59 | Admitting: Family

## 2013-11-14 ENCOUNTER — Encounter: Payer: Self-pay | Admitting: Family

## 2013-11-14 ENCOUNTER — Ambulatory Visit (INDEPENDENT_AMBULATORY_CARE_PROVIDER_SITE_OTHER): Payer: 59 | Admitting: Family

## 2013-11-14 VITALS — BP 130/84 | HR 80 | Temp 98.1°F | Resp 18 | Ht 63.0 in | Wt 283.0 lb

## 2013-11-14 DIAGNOSIS — M25569 Pain in unspecified knee: Secondary | ICD-10-CM

## 2013-11-14 DIAGNOSIS — R059 Cough, unspecified: Secondary | ICD-10-CM

## 2013-11-14 DIAGNOSIS — R05 Cough: Secondary | ICD-10-CM

## 2013-11-14 DIAGNOSIS — M25561 Pain in right knee: Secondary | ICD-10-CM

## 2013-11-14 MED ORDER — PRAVASTATIN SODIUM 40 MG PO TABS: 40.0000 mg | ORAL_TABLET | Freq: Every day | ORAL | Status: DC

## 2013-11-14 NOTE — Progress Notes (Signed)
Pre visit review using our clinic review tool, if applicable. No additional management support is needed unless otherwise documented below in the visit note. 

## 2013-11-14 NOTE — Patient Instructions (Signed)
You will be contacted about your referral to Sports Medicine.  Please let us know if you have not heard back within 1 week about your referral. Call if your cough does not continue to improve. Follow up in 3 months.

## 2013-11-14 NOTE — Progress Notes (Signed)
Subjective:    Patient ID: Leah Hampton, female    DOB: [birth year], 58 y.o.   MRN: [id number]  HPI  Leah Hampton is a 58 yr old female who presents today for ER follow up.  Was seen on 6/1 and diagnosed with bronchitis. Records are reviewed.  Was treated with duonebs, tessalon, and steroids.  She reports that she continues to have mild cough.  Not completely resolved but overall is improved.  Cough is described as dry.    Review of Systems See HPI  Past Medical History  Diagnosis Date  . Obesity   . Cystitis   . Hyperbilirubinemia   . History of chest pain   . Hematuria     urologice eval, Dr Estill Dooms  . Renal insufficiency   . Leukocytosis   . Osteoarthritis   . Hypertelorism   . Hyperlipemia     History   Social History  . Marital Status: Single    Spouse Name: N/A    Number of Children: 2  . Years of Education: N/A   Occupational History  . Cook     at [employer] History Main Topics  . Smoking status: Never Smoker   . Smokeless tobacco: Never Used  . Alcohol Use: No  . Drug Use: No  . Sexual Activity: Not on file   Other Topics Concern  . Not on file   Social History Narrative   Regular exercise:  No          Past Surgical History  Procedure Laterality Date  . Knee arthroscopy  04/2007    left knee  . 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 Diabetes    . Arthritis    . Stroke    . Coronary artery disease    . Heart failure Sister     x 2  . Heart failure Mother   . Heart attack Father 59    Allergies  Allergen Reactions  . Sulfamethoxazole-Trimethoprim [Bactrim] Itching  . Codeine     REACTION: nausea and vomiting    Current Outpatient Prescriptions on File Prior to Visit  Medication Sig Dispense Refill  . aspirin 81 MG tablet Take 81 mg by mouth daily.        . diclofenac (VOLTAREN) 75 MG EC tablet Take 75 mg by mouth 2 (two) times daily.      Marland Kitchen  lisinopril (PRINIVIL,ZESTRIL) 10 MG tablet Take 1 tablet (10 mg total) by mouth daily.  90 tablet  3  . traMADol (ULTRAM) 50 MG tablet Take 50 mg by mouth every 6 (six) hours as needed for moderate pain.      . valACYclovir (VALTREX) 500 MG tablet One tablet by mouth twice daily for 3 days at start of breakout.  30 tablet  1   No current facility-administered medications on file prior to visit.    BP 130/84  Pulse 80  Temp(Src) 98.1 F (36.7 C) (Oral)  Resp 18  Ht 5\' 3"  (1.6 m)  Wt 283 lb (128.368 kg)  BMI 50.14 kg/m2  SpO2 98%  LMP 11/10/2011       Objective:   Physical Exam  Constitutional: She is oriented to person, place, and time. She appears well-developed and well-nourished. No distress.  HENT:  Head: Normocephalic and atraumatic.  Cardiovascular: Normal rate and regular rhythm.   No murmur heard. Pulmonary/Chest: Effort  normal and breath sounds normal. No respiratory distress. She has no wheezes. She has no rales. She exhibits no tenderness.  Musculoskeletal: She exhibits no edema.  Neurological: She is alert and oriented to person, place, and time.  Psychiatric: She has a normal mood and affect. Her behavior is normal. Judgment and thought content normal.          Assessment & Plan:

## 2013-11-15 NOTE — Assessment & Plan Note (Signed)
Improved but not resolved.  We discussed that lisinopril could be contributing to cough symptom.  She does not wish to discontinue at this time.  Pt tells me that if symptoms persist, she will consider changing meds next visit.

## 2013-11-19 ENCOUNTER — Ambulatory Visit: Payer: 59 | Admitting: Family Medicine

## 2014-01-31 ENCOUNTER — Telehealth: Payer: Self-pay | Admitting: Family

## 2014-01-31 NOTE — Telephone Encounter (Signed)
Caller name: Dewayne  Relation to pt: self  Call back number: [phone]  Reason for call:   Pt experience a cough,  OTC is not working patient in need of clinical advice no insurance due to patient loosing her job.

## 2014-02-01 NOTE — Telephone Encounter (Signed)
Advised pt of need for evaluation in office or urgent care. Pt voices understanding and states she may call back to schedule appt for next week if symptoms do not improve. Advised pt if symptoms worsen (develops SOB or chest pain) to be evaluated in the ER. Pt voices understanding.

## 2014-02-01 NOTE — Telephone Encounter (Signed)
Noted and agree. 

## 2014-02-18 ENCOUNTER — Encounter (HOSPITAL_COMMUNITY): Payer: Self-pay | Admitting: Emergency Medicine

## 2014-02-18 ENCOUNTER — Emergency Department (HOSPITAL_COMMUNITY): Payer: Medicaid Other

## 2014-02-18 ENCOUNTER — Emergency Department (HOSPITAL_COMMUNITY)
Admission: EM | Admit: 2014-02-18 | Discharge: 2014-02-18 | Disposition: A | Discharge status: Home / Self Care | Payer: Medicaid Other | Attending: Emergency Medicine | Admitting: Emergency Medicine

## 2014-02-18 DIAGNOSIS — E785 Hyperlipidemia, unspecified: Secondary | ICD-10-CM | POA: Insufficient documentation

## 2014-02-18 DIAGNOSIS — Z79899 Other long term (current) drug therapy: Secondary | ICD-10-CM | POA: Insufficient documentation

## 2014-02-18 DIAGNOSIS — Z8742 Personal history of other diseases of the female genital tract: Secondary | ICD-10-CM | POA: Diagnosis not present

## 2014-02-18 DIAGNOSIS — Z862 Personal history of diseases of the blood and blood-forming organs and certain disorders involving the immune mechanism: Secondary | ICD-10-CM | POA: Insufficient documentation

## 2014-02-18 DIAGNOSIS — Q752 Hypertelorism: Secondary | ICD-10-CM | POA: Insufficient documentation

## 2014-02-18 DIAGNOSIS — J209 Acute bronchitis, unspecified: Secondary | ICD-10-CM | POA: Diagnosis not present

## 2014-02-18 DIAGNOSIS — M1990 Unspecified osteoarthritis, unspecified site: Secondary | ICD-10-CM | POA: Insufficient documentation

## 2014-02-18 DIAGNOSIS — Z7982 Long term (current) use of aspirin: Secondary | ICD-10-CM | POA: Diagnosis not present

## 2014-02-18 DIAGNOSIS — E669 Obesity, unspecified: Secondary | ICD-10-CM | POA: Diagnosis not present

## 2014-02-18 DIAGNOSIS — R05 Cough: Secondary | ICD-10-CM | POA: Diagnosis present

## 2014-02-18 DIAGNOSIS — Z791 Long term (current) use of non-steroidal anti-inflammatories (NSAID): Secondary | ICD-10-CM | POA: Diagnosis not present

## 2014-02-18 MED ORDER — AEROCHAMBER PLUS FLO-VU LARGE MISC
1.0000 | Freq: Once | Status: AC
  Administered 2014-02-18: 1
  Filled 2014-02-18: qty 1

## 2014-02-18 MED ORDER — ALBUTEROL SULFATE HFA 108 (90 BASE) MCG/ACT IN AERS
2.0000 | INHALATION_SPRAY | RESPIRATORY_TRACT | Status: DC | PRN
  Administered 2014-02-18: 2 via RESPIRATORY_TRACT
  Filled 2014-02-18: qty 6.7

## 2014-02-18 NOTE — ED Provider Notes (Signed)
CSN: [id number]     Arrival date & time 02/18/14  1828 History   First MD Initiated Contact with Patient 02/18/14 2002     Chief Complaint  Patient presents with  . Cough     (Consider location/radiation/quality/duration/timing/severity/associated sxs/prior Treatment) HPI Comments: This is the second episode of long last coughing since June  Dx with bronchitis given an inhaler( use all) and resolved. Seen by PCP in July for routine exam  Cough had resolved, no medication changes.  About 3 weeks ago noted to again develop cough worse when laying down  Has been taking OTC cough medication without relief. Can not see PCP due to lack of insurance   Patient is a 58 y.o. female presenting with cough. The history is provided by the patient.  Cough Cough characteristics:  Non-productive Severity:  Moderate Onset quality:  Gradual Duration:  3 weeks Timing:  Intermittent Progression:  Unchanged Chronicity:  Recurrent Smoker: no   Context: weather changes   Context: not upper respiratory infection and not with activity   Relieved by:  Nothing Worsened by:  Lying down Ineffective treatments:  Cough suppressants Associated symptoms: no chest pain, no diaphoresis, no fever, no headaches, no myalgias, no rash, no rhinorrhea, no shortness of breath, no sinus congestion, no sore throat, no weight loss and no wheezing     Past Medical History  Diagnosis Date  . Obesity   . Cystitis   . Hyperbilirubinemia   . History of chest pain   . Hematuria     urologice eval, Dr Estill Dooms  . Renal insufficiency   . Leukocytosis   . Osteoarthritis   . Hypertelorism   . Hyperlipemia    Past Surgical History  Procedure Laterality Date  . Knee arthroscopy  04/2007    left knee  . 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 Diabetes    . Arthritis    . Stroke    . Coronary artery disease    . Heart failure Sister     x 2  . Heart failure Mother    . Heart attack Father 79   History  Substance Use Topics  . Smoking status: Never Smoker   . Smokeless tobacco: Never Used  . Alcohol Use: No   OB History   Grav Para Term Preterm Abortions TAB SAB Ect Mult Living                 Review of Systems  Constitutional: Negative for fever, weight loss and diaphoresis.  HENT: Negative for congestion, postnasal drip, rhinorrhea and sore throat.   Respiratory: Positive for cough. Negative for chest tightness, shortness of breath and wheezing.   Cardiovascular: Negative for chest pain and palpitations.  Musculoskeletal: Negative for myalgias.  Skin: Negative for rash.  Neurological: Negative for headaches.  All other systems reviewed and are negative.     Allergies  Sulfamethoxazole-trimethoprim and Codeine  Home Medications   Prior to Admission medications   Medication Sig Start Date End Date Taking? Authorizing Provider  aspirin 81 MG tablet Take 81 mg by mouth daily.      Historical Provider, MD  diclofenac (VOLTAREN) 75 MG EC tablet Take 75 mg by mouth 2 (two) times daily.    Historical Provider, MD  lisinopril (PRINIVIL,ZESTRIL) 10 MG tablet Take 1 tablet (10 mg total) by mouth daily. 09/07/13   Debbrah Alar, NP  pravastatin (PRAVACHOL) 40 MG tablet  Take 1 tablet (40 mg total) by mouth daily. 11/14/13   Debbrah Alar, NP  traMADol (ULTRAM) 50 MG tablet Take 50 mg by mouth every 6 (six) hours as needed for moderate pain.    Historical Provider, MD  valACYclovir (VALTREX) 500 MG tablet One tablet by mouth twice daily for 3 days at start of breakout. 05/23/12   Debbrah Alar, NP   BP 150/98  Pulse 89  Temp(Src) 98.2 F (36.8 C) (Oral)  Resp 16  Ht 5\' 4"  (1.626 m)  Wt 280 lb (127.007 kg)  BMI 48.04 kg/m2  SpO2 98%  LMP 11/10/2011 Physical Exam  Nursing note and vitals reviewed. Constitutional: She is oriented to person, place, and time. She appears well-developed and well-nourished.  HENT:  Head:  Normocephalic.  Eyes: Pupils are equal, round, and reactive to light.  Cardiovascular: Normal rate and regular rhythm.   Pulmonary/Chest: Effort normal and breath sounds normal. No respiratory distress. She has no wheezes. She has no rales. She exhibits no tenderness.  Musculoskeletal: She exhibits no edema.  Lymphadenopathy:    She has no cervical adenopathy.  Neurological: She is alert and oriented to person, place, and time.  Skin: Skin is warm and dry.    ED Course  Procedures (including critical care time) Labs Review Labs Reviewed - No data to display  Imaging Review Dg Chest 2 View  02/18/2014   CLINICAL DATA:  Cough and wheezing for the past 3 weeks. History of hypertension.  EXAM: CHEST  2 VIEW  COMPARISON:  10/15/2013.  FINDINGS: The cardiac silhouette is near the upper limit of normal in size. Clear lungs. Mild diffuse peribronchial thickening with improvement. Thoracic spine degenerative changes.  IMPRESSION: Mild bronchitic changes with improvement.   Electronically Signed   By: Enrique Sack M.D.   On: 02/18/2014 19:38     EKG Interpretation None     Chest xray reviewed  Better than in June but still with bronchitic changes  Will give 2 puffs from Albuterol inhaler with air chamber than reevaluate. Will DC with inhaler and resource list hopefully patient will be able to establish with the [facility] Patient reports some relief with use of inhaler  MDM   Final diagnoses:  Bronchitis, acute, with bronchospasm         Garald Balding, NP 02/18/14 2055  Garald Balding, NP 02/18/14 2056

## 2014-02-18 NOTE — ED Notes (Signed)
Pt reports NP cough x 3 weeks. Denies CP, SOB, denies fever/chills. States " I had bronchitis in May and this feels the same." Lungs clear. Pt in NAD.

## 2014-02-18 NOTE — Discharge Instructions (Signed)
Acute Bronchitis Bronchitis is when the airways that extend from the windpipe into the lungs get red, puffy, and painful (inflamed). Bronchitis often causes thick spit (mucus) to develop. This leads to a cough. A cough is the most common symptom of bronchitis. In acute bronchitis, the condition usually begins suddenly and goes away over time (usually in 2 weeks). Smoking, allergies, and asthma can make bronchitis worse. Repeated episodes of bronchitis may cause more lung problems. HOME CARE  Rest.  Drink enough fluids to keep your pee (urine) clear or pale yellow (unless you need to limit fluids as told by your doctor).  Only take over-the-counter or prescription medicines as told by your doctor.  Avoid smoking and secondhand smoke. These can make bronchitis worse. If you are a smoker, think about using nicotine gum or skin patches. Quitting smoking will help your lungs heal faster.  Reduce the chance of getting bronchitis again by:  Washing your hands often.  Avoiding people with cold symptoms.  Trying not to touch your hands to your mouth, nose, or eyes.  Follow up with your doctor as told. GET HELP IF: Your symptoms do not improve after 1 week of treatment. Symptoms include:  Cough.  Fever.  Coughing up thick spit.  Body aches.  Chest congestion.  Chills.  Shortness of breath.  Sore throat. GET HELP RIGHT AWAY IF:   You have an increased fever.  You have chills.  You have severe shortness of breath.  You have bloody thick spit (sputum).  You throw up (vomit) often.  You lose too much body fluid (dehydration).  You have a severe headache.  You faint. MAKE SURE YOU:   Understand these instructions.  Will watch your condition.  Will get help right away if you are not doing well or get worse. Document Released: 10/20/2007 Document Revised: 01/03/2013 Document Reviewed: 10/24/2012 [facility] Patient Information 2015 [place], [place]. This information is not  intended to replace advice given to you by your health care provider. Make sure you discuss any questions you have with your health care provider. Please use the inhaler as follows  2 puffs every 4-6 hours while awake for 2 days than as needed If you wake during the night with coughing you can use the inhaler but do not set an alarm to wake you for this After the 2 days use as needed You have been give a resource guide to help you find a PCP   Emergency Department Resource Guide 1) Find a Doctor and Pay Out of Pocket Although you won't have to find out who is covered by your insurance plan, it is a good idea to ask around and get recommendations. You will then need to call the office and see if the doctor you have chosen will accept you as a new patient and what types of options they offer for patients who are self-pay. Some doctors offer discounts or will set up payment plans for their patients who do not have insurance, but you will need to ask so you aren't surprised when you get to your appointment.  2) Contact Your Local Health Department Not all health departments have doctors that can see patients for sick visits, but many do, so it is worth a call to see if yours does. If you don't know where your local health department is, you can check in your phone book. The CDC also has a tool to help you locate your state's health department, and many state websites also have listings  of all of their local health departments.  3) Find a [facility] If your illness is not likely to be very severe or complicated, you may want to try a walk in clinic. These are popping up all over the country in pharmacies, drugstores, and shopping centers. They're usually staffed by nurse practitioners or physician assistants that have been trained to treat common illnesses and complaints. They're usually fairly quick and inexpensive. However, if you have serious medical issues or chronic medical problems, these are  probably not your best option.  No Primary Care Doctor: - Call Health Connect at  [phone] - they can help you locate a primary care doctor that  accepts your insurance, provides certain services, etc. - Physician Referral Service- [phone]  Chronic Pain Problems: Organization         Address  Phone   Notes  [facility]  [phone] Patients need to be referred by their primary care doctor.   Medication Assistance: Organization         Address  Phone   Notes  [facility] Medication [facility] [address] [place], [place] [address] [phone] --Must be a resident of [facility] -- Must have NO insurance coverage whatsoever (no Medicaid/ Medicare, etc.) -- The pt. MUST have a primary care doctor that directs their care regularly and follows them in the community   MedAssist  [phone]   [employer]  [phone]    Agencies that provide inexpensive medical care: Organization         Address  Phone   Notes  [place]  [phone]   [facility]    [phone]   [facility] [place], [place] [address] [phone]   [place] [address], [place] [phone]   Planned Parenthood    [phone]   [facility]    [phone]   [place] and [place] [address], [place] Phone:  [phone], Fax:  [phone] Hours of Operation:  9 am - 6 pm, M-F.  Also accepts Medicaid/Medicare and self-pay.  [facility] for [place] [address], [address], [place] Phone: [phone], Fax: [phone]. Hours of Operation:  8:30 am - 5:30 pm, M-F.  Also accepts Medicaid and self-pay.  [facility] [place] [address], [place] Phone: [phone]   [place], [place], [place] [phone], Ext. [phone] Mondays & Thursdays:  7-9 AM.  First 15 patients are seen on a first come, first serve basis.    [place] Providers:  Organization         Address  Phone   Notes  [facility] [address],  [phone] Also accepts self-pay patients.  [facility] [address], [place]  [phone]   [place], [address], [place] [phone]   [facility] [address] [place] [phone]   Lucianne Lei [address], [place]   [phone] Only accepts [place] Access [place] patients after they have their name applied to their card.   Self-Pay (no insurance) in [facility]:  Patent attorney   Notes  Sickle Cell Patients, [facility] Internal Medicine [place] [phone]   [facility] [place] [phone]   [facility] [place]  [place], [address], [place] [phone]   [facility]/Dr. Osei-Bonsu  [address], [place] or [address], [place] [phone] Phone number for both [place] and [place] locations is the same.  [facility] [address] [place] [phone]   [facility] [address] [place] or [address] [phone]   [facility] [address] [place] [phone], phone; [phone], fax Sees patients 1st and 3rd Saturday of every month.  Must not qualify for public or private insurance (i.e. Medicaid, Medicare, [place] Health Choice, Veterans' Benefits)  Household income should be no more than 200% of the poverty level The clinic cannot treat you if you are pregnant or think you are pregnant  Sexually transmitted diseases are not treated at the clinic.     Dental Care: Organization         Address  Phone  Notes  [facility] Department of [facility] [place] [phone] Accepts children up to age 34 who are enrolled in [place] or [place]; pregnant women with a Medicaid card; and children who have applied for Medicaid or [place] Health Choice, but were declined, whose parents can pay a reduced fee at time of service.  [facility] Department of [facility]  [address], [place] [phone] Accepts children up to age 56 who are enrolled in [place] or [place]; pregnant women with a Medicaid card; and children who have applied for Medicaid or [place] Health Choice, but were declined, whose parents can pay a reduced fee at time of service.  [place] Adult [facility] Access PRO[facility]  [place] [phone] Patients are seen by appointment only. Walk-ins are not accepted. Campus will see patients 48 years of age and older. Monday - Tuesday (8am-5pm) Most Wednesdays (8:30-5pm) $30 per visit, cash only  [facility] Adult [facility] Access PROGRAM  [address], [facility] [phone] Patients are seen by appointment only. Walk-ins are not accepted. [place] will see patients 80 years of age and older. One Wednesday Evening (Monthly: Volunteer Based).  $30 per visit, cash only  [place]  [phone] for adults; Children under age 67, call Graduate Pediatric Dentistry at [phone]. Children aged 22-14, please call [phone] to request a pediatric application.  Dental services are provided in all areas of dental care including fillings, crowns and bridges, complete and partial dentures, implants, gum treatment, root canals, and extractions. Preventive care is also provided. Treatment is provided to both adults and children. Patients are selected via a lottery and there is often a waiting  list.   [facility] [address], [place]  [phone] [email or web address]   [facility] [address] [place], [place] [phone], Ext. [phone] Second and Fourth Thursday of each month, opens at 6:30 AM; Clinic ends at 9 AM.  Patients are seen on a first-come first-served basis, and a limited number are seen during each clinic.   [facility]  [address] Hillard Danker [place], [place] [phone]   Eligibility  Requirements You must have lived in [place], [place], or [place] counties for at least the last three months.   You cannot be eligible for state or federal sponsored [employer], including [employer], [place], or [employer].   You generally cannot be eligible for healthcare insurance through your employer.    How to apply: Eligibility screenings are held every Tuesday and Wednesday afternoon from 1:00 pm until 4:00 pm. You do not need an appointment for the interview!  [facility] [address] [place], [place]   [place]  [place] Department  [place]  [phone]    Behavioral Health Resources in the Community: Intensive Outpatient Programs Organization         Address  Phone  Notes  [place]. [address] [place], [place] [phone]   [facility] Outpatient [address] [place], [place]   ADS: Alcohol & Drug Svcs [address], [place], [place]   [place] [address]  [place], [place] or [phone]   Substance Abuse Resources Organization         Address  Phone  Notes  Alcohol and Drug Services  [phone]   [place]  [phone]   [place]   Chinita Pester  [phone]   Residential & Outpatient Substance Abuse Program   [phone]   Psychological Services Organization         Address  Phone  Notes  [facility] [place]  [place]  [phone]   [place] [address], [place] or [phone]    Mobile Crisis Teams Organization         Address  Phone  Notes  [facility] Alternatives, Mobile Crisis Care Unit  [phone]   Assertive Psychotherapeutic Services  [address] [place], [place]   [place] [address] [place] [phone]    Self-Help/Support Groups Organization         Address  Phone             Notes  [place]. of [place] - variety of support groups  [place] Call for more information  Narcotics Anonymous (NA), [facility] [address], [employer] [place]  2 meetings at this location   Special educational needs teacher         Address  Phone  Notes  [facility] Residential Treatment [place],    [place]  [phone]   [facility]  [address], [place] [address], [place], [place]   [place], [place] [phone] Admissions: 8am-3pm M-F  Incentives Substance [place] [address]    [place], [place] [phone]   [facility] [address] Jadene Pierini [place], [place]   [facility] [address],  [place], [place]   [facility] - Intensive Outpatient [address], [place], [place]   [facility] [place].,  [place], [place] or [phone]   [facility]idential Treatment Services (RTS) [address], [place], [place] Accepts Medicaid  [facility] [place] [address],  [place] [phone] Substance Abuse/Addiction Treatment   [facility] Resources Organization  Address  Phone  Notes  [facility]  [phone]   Domenic Schwab, PhD [address] Arlis [place], [place]   [phone] or [phone]   [place], [place] [phone]   [address], [place], [place] [phone] Insurance/Medicaid/sponsorship through [facility] and Families [address], [address] [place]                                    [place], [place] [phone] [place] [address], [place] [phone]    Dr. Adele Schilder  [phone]   [facility] Dept. 1) [address] [place] 2) [place] 3)  [place] [address], [place] [phone]  [phone]   [place] [phone] or [phone] (After Hours)

## 2014-02-18 NOTE — ED Notes (Signed)
Declined W/C at D/C and was escorted to lobby by RN. 

## 2014-02-19 NOTE — ED Provider Notes (Signed)
Medical screening examination/treatment/procedure(s) were performed by non-physician practitioner and as supervising physician I was immediately available for consultation/collaboration.   EKG Interpretation None        Wandra Arthurs, MD 02/19/14 2347

## 2014-02-20 ENCOUNTER — Ambulatory Visit: Payer: 59 | Admitting: Family

## 2014-03-06 ENCOUNTER — Ambulatory Visit: Payer: Self-pay | Admitting: Family

## 2014-03-10 ENCOUNTER — Encounter (HOSPITAL_COMMUNITY): Payer: Self-pay | Admitting: Emergency Medicine

## 2014-03-10 ENCOUNTER — Emergency Department (HOSPITAL_COMMUNITY): Payer: Self-pay

## 2014-03-10 ENCOUNTER — Emergency Department (HOSPITAL_COMMUNITY)
Admission: EM | Admit: 2014-03-10 | Discharge: 2014-03-10 | Disposition: A | Discharge status: Home / Self Care | Payer: Self-pay | Attending: Emergency Medicine | Admitting: Emergency Medicine

## 2014-03-10 DIAGNOSIS — Z862 Personal history of diseases of the blood and blood-forming organs and certain disorders involving the immune mechanism: Secondary | ICD-10-CM | POA: Insufficient documentation

## 2014-03-10 DIAGNOSIS — R05 Cough: Secondary | ICD-10-CM | POA: Insufficient documentation

## 2014-03-10 DIAGNOSIS — J209 Acute bronchitis, unspecified: Secondary | ICD-10-CM | POA: Insufficient documentation

## 2014-03-10 DIAGNOSIS — E785 Hyperlipidemia, unspecified: Secondary | ICD-10-CM | POA: Insufficient documentation

## 2014-03-10 DIAGNOSIS — Z7982 Long term (current) use of aspirin: Secondary | ICD-10-CM | POA: Insufficient documentation

## 2014-03-10 DIAGNOSIS — M1990 Unspecified osteoarthritis, unspecified site: Secondary | ICD-10-CM | POA: Insufficient documentation

## 2014-03-10 DIAGNOSIS — Z79899 Other long term (current) drug therapy: Secondary | ICD-10-CM | POA: Insufficient documentation

## 2014-03-10 DIAGNOSIS — J40 Bronchitis, not specified as acute or chronic: Secondary | ICD-10-CM

## 2014-03-10 DIAGNOSIS — R053 Chronic cough: Secondary | ICD-10-CM

## 2014-03-10 DIAGNOSIS — R1110 Vomiting, unspecified: Secondary | ICD-10-CM | POA: Insufficient documentation

## 2014-03-10 DIAGNOSIS — Z87448 Personal history of other diseases of urinary system: Secondary | ICD-10-CM | POA: Insufficient documentation

## 2014-03-10 DIAGNOSIS — R0602 Shortness of breath: Secondary | ICD-10-CM

## 2014-03-10 DIAGNOSIS — E669 Obesity, unspecified: Secondary | ICD-10-CM | POA: Insufficient documentation

## 2014-03-10 HISTORY — DX: Bronchitis, not specified as acute or chronic: J40

## 2014-03-10 LAB — CBC
HCT: 39.1 % (ref 36.0–46.0)
Hemoglobin: 12.9 g/dL (ref 12.0–15.0)
MCH: 28.1 pg (ref 26.0–34.0)
MCHC: 33 g/dL (ref 30.0–36.0)
MCV: 85.2 fL (ref 78.0–100.0)
PLATELETS: 255 10*3/uL (ref 150–400)
RBC: 4.59 MIL/uL (ref 3.87–5.11)
RDW: 15 % (ref 11.5–15.5)
WBC: 6.9 10*3/uL (ref 4.0–10.5)

## 2014-03-10 LAB — URINALYSIS, ROUTINE W REFLEX MICROSCOPIC
Bilirubin Urine: NEGATIVE
Glucose, UA: NEGATIVE mg/dL
Ketones, ur: 15 mg/dL — AB
Leukocytes, UA: NEGATIVE
NITRITE: NEGATIVE
Protein, ur: NEGATIVE mg/dL
SPECIFIC GRAVITY, URINE: 1.024 (ref 1.005–1.030)
UROBILINOGEN UA: 1 mg/dL (ref 0.0–1.0)
pH: 5.5 (ref 5.0–8.0)

## 2014-03-10 LAB — BASIC METABOLIC PANEL
Anion gap: 12 (ref 5–15)
BUN: 10 mg/dL (ref 6–23)
CO2: 27 meq/L (ref 19–32)
CREATININE: 0.73 mg/dL (ref 0.50–1.10)
Calcium: 9.4 mg/dL (ref 8.4–10.5)
Chloride: 103 mEq/L (ref 96–112)
GFR calc Af Amer: >90 mL/min (ref >90)
GFR calc non Af Amer: >90 mL/min (ref >90)
Glucose, Bld: 106 mg/dL — ABNORMAL HIGH (ref 70–99)
Potassium: 3.7 mEq/L (ref 3.7–5.3)
Sodium: 142 mEq/L (ref 137–147)

## 2014-03-10 LAB — URINE MICROSCOPIC-ADD ON

## 2014-03-10 LAB — I-STAT TROPONIN, ED
TROPONIN I, POC: 0 ng/mL (ref 0.00–0.08)
Troponin i, poc: 0 ng/mL (ref 0.00–0.08)

## 2014-03-10 LAB — PRO B NATRIURETIC PEPTIDE: Pro B Natriuretic peptide (BNP): 26.1 pg/mL (ref 0–125)

## 2014-03-10 MED ORDER — PREDNISONE 20 MG PO TABS: ORAL_TABLET | ORAL | Status: DC

## 2014-03-10 MED ORDER — PREDNISONE 20 MG PO TABS
60.0000 mg | ORAL_TABLET | Freq: Once | ORAL | Status: AC
  Administered 2014-03-10: 60 mg via ORAL
  Filled 2014-03-10: qty 3

## 2014-03-10 MED ORDER — ALBUTEROL SULFATE HFA 108 (90 BASE) MCG/ACT IN AERS
2.0000 | INHALATION_SPRAY | Freq: Once | RESPIRATORY_TRACT | Status: AC
  Administered 2014-03-10: 2 via RESPIRATORY_TRACT
  Filled 2014-03-10: qty 6.7

## 2014-03-10 MED ORDER — AZITHROMYCIN 250 MG PO TABS: 250.0000 mg | ORAL_TABLET | Freq: Every day | ORAL | Status: DC

## 2014-03-10 MED ORDER — BENZONATATE 100 MG PO CAPS: 100.0000 mg | ORAL_CAPSULE | Freq: Three times a day (TID) | ORAL | Status: DC

## 2014-03-10 NOTE — ED Notes (Signed)
Pt c.o shortness of breath with hacking cough, clear mucous for two months. diagnosed with bronchitis.

## 2014-03-10 NOTE — ED Provider Notes (Signed)
CSN: [id number]     Arrival date & time 03/10/14  1741 History   First MD Initiated Contact with Patient 03/10/14 2100     Chief Complaint  Patient presents with  . Shortness of Breath  . Headache     (Consider location/radiation/quality/duration/timing/severity/associated sxs/prior Treatment) HPI Leah Hampton is a 58 y.o. female with PMH of hyperlipidemia, renal insufficiency, cystitis, bronchitis 2 months ago presenting with persistent cough that started in June. Patient reports productive thin clear mucus sputum. Patient endorses cough is worse with lying down. She has had post tussive emesis due to coughing. No blood or bile in emesis. No acute worsening. Patient endorses SOB during coughing episodes. Patient reports living with sister who smokes for past 3 weeks. Patient has used albuterol inhaler, robitussin, vapor rup without relief. No fevers, chills. Some sinus congestion and rhinorrhea. Patient without abdominal pain but endorses mild dysuria. No foul odor to urine. Patient also with headache for 2-3 days. Patient reports gradual onset, similar to prior headaches. No visual changes, slurred speech, nausea or vomiting associated with HA or weakness.   Past Medical History  Diagnosis Date  . Obesity   . Cystitis   . Hyperbilirubinemia   . History of chest pain   . Hematuria     urologice eval, Dr Estill Dooms  . Renal insufficiency   . Leukocytosis   . Osteoarthritis   . Hypertelorism   . Hyperlipemia   . Bronchitis    Past Surgical History  Procedure Laterality Date  . Knee arthroscopy  04/2007    left knee  . 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 Diabetes    . Arthritis    . Stroke    . Coronary artery disease    . Heart failure Sister     x 2  . Heart failure Mother   . Heart attack Father 73   History  Substance Use Topics  . Smoking status: Never Smoker   . Smokeless tobacco: Never Used  . Alcohol  Use: No   OB History   Grav Para Term Preterm Abortions TAB SAB Ect Mult Living                 Review of Systems  Constitutional: Negative for fever and chills.  HENT: Positive for congestion and postnasal drip. Negative for rhinorrhea.   Eyes: Negative for visual disturbance.  Respiratory: Positive for cough and shortness of breath.   Cardiovascular: Negative for chest pain and palpitations.  Gastrointestinal: Positive for vomiting. Negative for nausea and diarrhea.  Genitourinary: Positive for dysuria. Negative for hematuria.  Musculoskeletal: Negative for back pain and gait problem.  Skin: Negative for rash.  Neurological: Positive for headaches. Negative for weakness.      Allergies  Sulfamethoxazole-trimethoprim and Codeine  Home Medications   Prior to Admission medications   Medication Sig Start Date End Date Taking? Authorizing Provider  aspirin 81 MG tablet Take 81 mg by mouth daily.     Yes Historical Provider, MD  lisinopril (PRINIVIL,ZESTRIL) 10 MG tablet Take 1 tablet (10 mg total) by mouth daily. 09/07/13  Yes Debbrah Alar, NP  pravastatin (PRAVACHOL) 40 MG tablet Take 1 tablet (40 mg total) by mouth daily. 11/14/13  Yes Debbrah Alar, NP  traMADol (ULTRAM) 50 MG tablet Take 50 mg by mouth every 6 (six) hours as needed for moderate pain.   Yes Historical Provider, MD  azithromycin (  ZITHROMAX) 250 MG tablet Take 1 tablet (250 mg total) by mouth daily. Take first 2 tablets together, then 1 every day until finished. 03/10/14   Pura Spice, PA-C  benzonatate (TESSALON) 100 MG capsule Take 1 capsule (100 mg total) by mouth every 8 (eight) hours. 03/10/14   Pura Spice, PA-C  predniSONE (DELTASONE) 20 MG tablet 2 tabs po daily x 4 days 03/10/14   Pura Spice, PA-C   BP 129/74  Pulse 103  Temp(Src) 98.9 F (37.2 C) (Oral)  Resp 22  SpO2 98%  LMP 11/10/2011 Physical Exam  Nursing note and vitals reviewed. Constitutional: She appears  well-developed and well-nourished. No distress.  HENT:  Head: Normocephalic and atraumatic.  Nose: Right sinus exhibits no maxillary sinus tenderness and no frontal sinus tenderness. Left sinus exhibits no maxillary sinus tenderness and no frontal sinus tenderness.  Mouth/Throat: Mucous membranes are normal. Posterior oropharyngeal erythema present. No oropharyngeal exudate or posterior oropharyngeal edema.  Eyes: Conjunctivae and EOM are normal. Right eye exhibits no discharge. Left eye exhibits no discharge.  Neck: Normal range of motion. Neck supple.  Cardiovascular: Normal rate, regular rhythm and normal heart sounds.   Pulmonary/Chest: Effort normal and breath sounds normal. No respiratory distress. She has no wheezes. She has no rales.  Abdominal: Soft. Bowel sounds are normal. She exhibits no distension. There is no tenderness.  Lymphadenopathy:    She has cervical adenopathy.  Neurological: She is alert.  Speech is clear and goal oriented. Peripheral visual fields intact. Strength 5/5 in upper and lower extremities. Sensation intact. No pronator drift. Normal gait.   Skin: Skin is warm and dry. She is not diaphoretic.    ED Course  Procedures (including critical care time) Labs Review Labs Reviewed  BASIC METABOLIC PANEL - Abnormal; Notable for the following:    Glucose, Bld 106 (*)    All other components within normal limits  URINALYSIS, ROUTINE W REFLEX MICROSCOPIC - Abnormal; Notable for the following:    Color, Urine AMBER (*)    APPearance CLOUDY (*)    Hgb urine dipstick SMALL (*)    Ketones, ur 15 (*)    All other components within normal limits  URINE MICROSCOPIC-ADD ON - Abnormal; Notable for the following:    Bacteria, UA FEW (*)    Crystals CA OXALATE CRYSTALS (*)    All other components within normal limits  CBC  PRO B NATRIURETIC PEPTIDE  I-STAT TROPOININ, ED  I-STAT TROPOININ, ED    Imaging Review Dg Chest 2 View  03/10/2014   CLINICAL DATA:   Shortness of breath and wheezing for past few weeks.  EXAM: CHEST  2 VIEW  COMPARISON:  Chest radiograph 02/18/2014  FINDINGS: The heart size and mediastinal contours are within normal limits. Both lungs are clear. The visualized skeletal structures are unremarkable.  IMPRESSION: No active cardiopulmonary disease.   Electronically Signed   By: Curlene Dolphin M.D.   On: 03/10/2014 18:55     EKG Interpretation None     Meds given in ED:  Medications  albuterol (PROVENTIL HFA;VENTOLIN HFA) 108 (90 BASE) MCG/ACT inhaler 2 puff (2 puffs Inhalation Given 03/10/14 2238)  predniSONE (DELTASONE) tablet 60 mg (60 mg Oral Given 03/10/14 2238)    New Prescriptions   AZITHROMYCIN (ZITHROMAX) 250 MG TABLET    Take 1 tablet (250 mg total) by mouth daily. Take first 2 tablets together, then 1 every day until finished.   BENZONATATE (TESSALON) 100 MG CAPSULE  Take 1 capsule (100 mg total) by mouth every 8 (eight) hours.   PREDNISONE (DELTASONE) 20 MG TABLET    2 tabs po daily x 4 days      MDM   Final diagnoses:  Bronchitis  Persistent cough   Patient with persistent cough and diagnosis of bronchitis for over 3 months. Patient with complaint of SOB due to the cough. Patient living with her sister who is a smoker. VSS. Lungs CTAB, no respiratory distress. CXR without signs of infiltrate. tx with albuterol inhaler and oral steroid in ED. Script see above. Pt also with complaint of mild dysuria without convincing signs of infection. HA improved in ED and typical of her headaches with gradual onset. Completely benign neurological exam. Not concerned with ICH, SAH, meningitis. Pt to follow up with PCP for persistent symptoms.   Discussed return precautions with patient. Discussed all results and patient verbalizes understanding and agrees with plan.  This is a shared patient. This patient was discussed with the physician who saw and evaluated the patient and agrees with the plan.    Pura Spice,  PA-C 03/10/14 [phone]

## 2014-03-10 NOTE — Discharge Instructions (Signed)
Return to the emergency room with worsening of symptoms, new symptoms or with symptoms that are concerning, fevers, chills, coughing up thick colored mucous or blood, unable to tolerate fluids.  Drink plenty of fluids with electrolytes especially Gatorade. OTC cold medications such as mucinex, nyquil, dayquil are recommended. Chloraseptic for sore throat. Please take all of your antibiotics until finished!   You may develop abdominal discomfort or diarrhea from the antibiotic.  You may help offset this with probiotics which you can buy or get in yogurt. Do not eat  or take the probiotics until 2 hours after your antibiotic.     Cough, Adult  A cough is a reflex that helps clear your throat and airways. It can help heal the body or may be a reaction to an irritated airway. A cough may only last 2 or 3 weeks (acute) or may last more than 8 weeks (chronic).  CAUSES Acute cough:  Viral or bacterial infections. Chronic cough:  Infections.  Allergies.  Asthma.  Post-nasal drip.  Smoking.  Heartburn or acid reflux.  Some medicines.  Chronic lung problems (COPD).  Cancer. SYMPTOMS   Cough.  Fever.  Chest pain.  Increased breathing rate.  High-pitched whistling sound when breathing (wheezing).  Colored mucus that you cough up (sputum). TREATMENT   A bacterial cough may be treated with antibiotic medicine.  A viral cough must run its course and will not respond to antibiotics.  Your caregiver may recommend other treatments if you have a chronic cough. HOME CARE INSTRUCTIONS   Only take over-the-counter or prescription medicines for pain, discomfort, or fever as directed by your caregiver. Use cough suppressants only as directed by your caregiver.  Use a cold steam vaporizer or humidifier in your bedroom or home to help loosen secretions.  Sleep in a semi-upright position if your cough is worse at night.  Rest as needed.  Stop smoking if you smoke. SEEK IMMEDIATE  MEDICAL CARE IF:   You have pus in your sputum.  Your cough starts to worsen.  You cannot control your cough with suppressants and are losing sleep.  You begin coughing up blood.  You have difficulty breathing.  You develop pain which is getting worse or is uncontrolled with medicine.  You have a fever. MAKE SURE YOU:   Understand these instructions.  Will watch your condition.  Will get help right away if you are not doing well or get worse. Document Released: 10/30/2010 Document Revised: 07/26/2011 Document Reviewed: 10/30/2010 [facility] Patient Information 2015 [place], [place]. This information is not intended to replace advice given to you by your health care provider. Make sure you discuss any questions you have with your health care provider.

## 2014-03-10 NOTE — ED Notes (Addendum)
C/o sob x 2 months.  Productive cough with thick white phlegm.  States she was diagnosed with bronchitis approx 2 months ago and doesn't feel any better.  Also reports headache x 2-3 days.    Pt has been living with her sister that smokes for the past 3 weeks.

## 2014-03-11 NOTE — ED Provider Notes (Signed)
Medical screening examination/treatment/procedure(s) were conducted as a shared visit with non-physician practitioner(s) and myself.  I personally evaluated the patient during the encounter.   EKG Interpretation None      Patient here with cough for over 3 months, sinus headaches. No fevers. CXR negative. Exam benign. Given Z-pack for her bronchitis that hasn't resolved in 3 months.  Leah Bucy, MD 03/11/14 [phone]

## 2014-05-01 ENCOUNTER — Encounter: Payer: Self-pay | Admitting: Internal Medicine

## 2014-05-01 ENCOUNTER — Ambulatory Visit: Payer: Medicaid Other | Attending: Internal Medicine | Admitting: Internal Medicine

## 2014-05-01 ENCOUNTER — Ambulatory Visit (HOSPITAL_BASED_OUTPATIENT_CLINIC_OR_DEPARTMENT_OTHER): Payer: Medicaid Other | Admitting: *Deleted

## 2014-05-01 VITALS — BP 144/83 | HR 102 | Temp 98.5°F | Resp 16 | Ht 64.0 in | Wt 289.0 lb

## 2014-05-01 DIAGNOSIS — I10 Essential (primary) hypertension: Secondary | ICD-10-CM | POA: Diagnosis not present

## 2014-05-01 DIAGNOSIS — E782 Mixed hyperlipidemia: Secondary | ICD-10-CM | POA: Insufficient documentation

## 2014-05-01 DIAGNOSIS — Z23 Encounter for immunization: Secondary | ICD-10-CM | POA: Diagnosis not present

## 2014-05-01 DIAGNOSIS — M79671 Pain in right foot: Secondary | ICD-10-CM | POA: Diagnosis not present

## 2014-05-01 DIAGNOSIS — E785 Hyperlipidemia, unspecified: Secondary | ICD-10-CM | POA: Diagnosis not present

## 2014-05-01 DIAGNOSIS — R05 Cough: Secondary | ICD-10-CM | POA: Diagnosis not present

## 2014-05-01 DIAGNOSIS — R059 Cough, unspecified: Secondary | ICD-10-CM

## 2014-05-01 MED ORDER — ALBUTEROL SULFATE (2.5 MG/3ML) 0.083% IN NEBU
2.5000 mg | INHALATION_SOLUTION | Freq: Once | RESPIRATORY_TRACT | Status: AC
  Administered 2014-05-01: 2.5 mg via RESPIRATORY_TRACT

## 2014-05-01 MED ORDER — ALBUTEROL SULFATE HFA 108 (90 BASE) MCG/ACT IN AERS: 2.0000 | INHALATION_SPRAY | Freq: Four times a day (QID) | RESPIRATORY_TRACT | Status: DC | PRN

## 2014-05-01 MED ORDER — HYDROCHLOROTHIAZIDE 12.5 MG PO TABS: 12.5000 mg | ORAL_TABLET | Freq: Every day | ORAL | Status: DC

## 2014-05-01 MED ORDER — OMEPRAZOLE 20 MG PO CPDR: 20.0000 mg | DELAYED_RELEASE_CAPSULE | Freq: Every day | ORAL | Status: DC

## 2014-05-01 MED ORDER — LOSARTAN POTASSIUM 25 MG PO TABS: 25.0000 mg | ORAL_TABLET | Freq: Every day | ORAL | Status: DC

## 2014-05-01 MED ORDER — PRAVASTATIN SODIUM 40 MG PO TABS: 40.0000 mg | ORAL_TABLET | Freq: Every day | ORAL | Status: DC

## 2014-05-01 NOTE — Progress Notes (Signed)
Patient ID: Leah Hampton, female   DOB: 1955/07/13, 58 y.o.   MRN: [id number]  TIR:[id number]  QPY:[id number]  DOB - Dec 18, 1955  CC:  Chief Complaint  Patient presents with  . Establish Care       HPI: Leah Hampton is a 58 y.o. female  with PMH of hyperlipidemia, renal insufficiency, cystitis, and bronchitis. She states that she began to have persistent cough that started in June. Patient reports productive thin clear mucus production. Patient endorses cough is worse with lying down. She has had post tussive emesis due to coughing. No blood or bile in emesis. No acute worsening. Patient endorses SOB during coughing episodes. Patient reports living with sister for past three weeks who smokes. Patient has used albuterol inhaler, robitussin, and vapor rub without relief. No fevers or chills. Some sinus congestion and rhinorrhea. Patient without abdominal pain but endorses mild dysuria. No foul odor to urine. Patient also with headache for 2-3 days. Patient reports gradual onset, similar to prior headaches. No visual changes, slurred speech, nausea or vomiting associated with HA or weakness. Is currently on a ACE, been on for several months.  Ran out of her inhaler  2 weeks of left foot swelling, pain    Allergies  Allergen Reactions  . Sulfamethoxazole-Trimethoprim [Bactrim] Itching  . Codeine     REACTION: nausea and vomiting   Past Medical History  Diagnosis Date  . Obesity   . Cystitis   . Hyperbilirubinemia   . History of chest pain   . Hematuria     urologice eval, Dr Estill Dooms  . Renal insufficiency   . Leukocytosis   . Osteoarthritis   . Hypertelorism   . Hyperlipemia   . Bronchitis    Current Outpatient Prescriptions on File Prior to Visit  Medication Sig Dispense Refill  . aspirin 81 MG tablet Take 81 mg by mouth daily.      Marland Kitchen azithromycin (ZITHROMAX) 250 MG tablet Take 1 tablet (250 mg total) by mouth daily. Take first 2 tablets together, then 1 every day until  finished. 6 tablet 0  . benzonatate (TESSALON) 100 MG capsule Take 1 capsule (100 mg total) by mouth every 8 (eight) hours. 21 capsule 0  . lisinopril (PRINIVIL,ZESTRIL) 10 MG tablet Take 1 tablet (10 mg total) by mouth daily. 90 tablet 3  . pravastatin (PRAVACHOL) 40 MG tablet Take 1 tablet (40 mg total) by mouth daily. 90 tablet 1  . predniSONE (DELTASONE) 20 MG tablet 2 tabs po daily x 4 days 8 tablet 0  . traMADol (ULTRAM) 50 MG tablet Take 50 mg by mouth every 6 (six) hours as needed for moderate pain.     No current facility-administered medications on file prior to visit.   Family History  Problem Relation Age of Onset  . Diabetes    . Arthritis    . Stroke    . Coronary artery disease    . Heart failure Sister     x 2  . Heart failure Mother   . Heart attack Father 18   History   Social History  . Marital Status: Single    Spouse Name: N/A    Number of Children: 2  . Years of Education: N/A   Occupational History  . Cook     at [employer] History Main Topics  . Smoking status: Never Smoker   . Smokeless tobacco: Never Used  . Alcohol Use: No  . Drug Use: No  .  Sexual Activity: Not on file   Other Topics Concern  . Not on file   Social History Narrative   Regular exercise:  No          Review of Systems: See HPI    Objective:   Filed Vitals:   05/01/14 1504  BP: 144/83  Pulse: 102  Temp: 98.5 F (36.9 C)  Resp: 16    Physical Exam  Constitutional: She is oriented to person, place, and time.  HENT:  Right Ear: External ear normal.  Left Ear: External ear normal.  Cardiovascular: Normal rate, regular rhythm, normal heart sounds and intact distal pulses.   Pulmonary/Chest: Effort normal. She has wheezes.  Abdominal: Soft.  Musculoskeletal: She exhibits edema (RLE). She exhibits no tenderness.  Neurological: She is alert and oriented to person, place, and time.  Skin: Skin is warm and dry.     Lab Results    Component Value Date   WBC 6.9 03/10/2014   HGB 12.9 03/10/2014   HCT 39.1 03/10/2014   MCV 85.2 03/10/2014   PLT 255 03/10/2014   Lab Results  Component Value Date   CREATININE 0.73 03/10/2014   BUN 10 03/10/2014   NA 142 03/10/2014   K 3.7 03/10/2014   CL 103 03/10/2014   CO2 27 03/10/2014    Lab Results  Component Value Date   HGBA1C 6.2* 09/07/2013   Lipid Panel     Component Value Date/Time   CHOL 210* 09/07/2013 1034   TRIG 63 09/07/2013 1034   HDL 56 09/07/2013 1034   CHOLHDL 3.8 09/07/2013 1034   VLDL 13 09/07/2013 1034   LDLCALC 141* 09/07/2013 1034       Assessment and plan:   Leah Hampton was seen today for establish care.  Diagnoses and associated orders for this visit:  Essential hypertension - Refill losartan (COZAAR) 25 MG tablet; Take 1 tablet (25 mg total) by mouth daily. Patient blood pressure is stable and may continue on current medication.  Education on diet, exercise, and modifiable risk factors discussed. Will obtain appropriate labs as needed. Will follow up in 3-6 months.   HLD (hyperlipidemia) - Refill pravastatin (PRAVACHOL) 40 MG tablet; Take 1 tablet (40 mg total) by mouth daily. Education provided on proper lifestyle changes in order to lower cholesterol. Patient advised to maintain healthy weight and to keep total fat intake at 25-35% of total calories and carbohydrates 50-60% of total daily calories. Explained how high cholesterol places patient at risk for heart disease. Patient placed on appropriate medication and repeat labs in 6 months   Cough - Refill albuterol (PROVENTIL HFA;VENTOLIN HFA) 108 (90 BASE) MCG/ACT inhaler; Inhale 2 puffs into the lungs every 6 (six) hours as needed for wheezing or shortness of breath. - omeprazole (PRILOSEC) 20 MG capsule; Take 1 capsule (20 mg total) by mouth daily. - Refill albuterol (PROVENTIL) (2.5 MG/3ML) 0.083% nebulizer solution 2.5 mg; Take 3 mLs (2.5 mg total) by nebulization once.  Right  foot pain Discussed exercises to help with pain and inflammation   F/u in 3 months for HTN/HLD     Chari Manning, [facility] [phone] 05/01/2014, 4:04 PM

## 2014-05-01 NOTE — Progress Notes (Signed)
Pt is here to establish care. Pt has been dealing with bronchitis since May. Pt has had treatment but no improvement. Pt states that for a few week her left foot is swollen and very painful.

## 2014-05-14 ENCOUNTER — Ambulatory Visit: Payer: Self-pay | Attending: Internal Medicine

## 2014-05-14 MED ORDER — GUAIFENESIN-DM 100-10 MG/5ML PO SYRP
5.0000 mL | ORAL_SOLUTION | ORAL | Status: DC | PRN
Start: 2014-05-14 — End: 2014-06-13

## 2014-05-14 NOTE — Patient Instructions (Addendum)
DASH Eating Plan DASH stands for "Dietary Approaches to Stop Hypertension." The DASH eating plan is a healthy eating plan that has been shown to reduce high blood pressure (hypertension). Additional health benefits may include reducing the risk of type 2 diabetes mellitus, heart disease, and stroke. The DASH eating plan may also help with weight loss. WHAT DO I NEED TO KNOW ABOUT THE DASH EATING PLAN? For the DASH eating plan, you will follow these general guidelines:  Choose foods with a percent daily value for sodium of less than 5% (as listed on the food label).  Use salt-free seasonings or herbs instead of table salt or sea salt.  Check with your health care provider or pharmacist before using salt substitutes.  Eat lower-sodium products, often labeled as "lower sodium" or "no salt added."  Eat fresh foods.  Eat more vegetables, fruits, and low-fat dairy products.  Choose whole grains. Look for the word "whole" as the first word in the ingredient list.  Choose fish and skinless chicken or [place] more often than red meat. Limit fish, poultry, and meat to 6 oz (170 g) each day.  Limit sweets, desserts, sugars, and sugary drinks.  Choose heart-healthy fats.  Limit cheese to 1 oz (28 g) per day. Eat more home-cooked food and less restaurant, buffet, and fast food.Hypotension As your heart beats, it forces blood through your arteries. This force is your blood pressure. If your blood pressure is too low for you to go about your normal activities or to support the organs of your body, you have hypotension. Hypotension is also referred to as low blood pressure. When your blood pressure becomes too low, you may not get enough blood to your brain. As a result, you may feel weak, feel lightheaded, or develop a rapid heart rate. In a more severe case, you may faint. CAUSES Various conditions can cause hypotension. These include:  Blood loss.  Dehydration.  Heart or endocrine  problems.  Pregnancy.  Severe infection.  Not having a well-balanced diet filled with needed nutrients.  Severe allergic reactions (anaphylaxis). Some medicines, such as blood pressure medicine or water pills (diuretics), may lower your blood pressure below normal. Sometimes taking too much medicine or taking medicine not as directed can cause hypotension. TREATMENT  Hospitalization is sometimes required for hypotension if fluid or blood replacement is needed, if time is needed for medicines to wear off, or if further monitoring is needed. Treatment might include changing your diet, changing your medicines (including medicines aimed at raising your blood pressure), and use of support stockings. HOME CARE INSTRUCTIONS   Drink enough fluids to keep your urine clear or pale yellow.  Take your medicines as directed by your health care provider.  Get up slowly from reclining or sitting positions. This gives your blood pressure a chance to adjust.  Wear support stockings as directed by your health care provider.  Maintain a healthy diet by including nutritious food, such as fruits, vegetables, nuts, whole grains, and lean meats. SEEK MEDICAL CARE IF:  You have vomiting or diarrhea.  You have a fever for more than 2-3 days.  You feel more thirsty than usual.  You feel weak and tired. SEEK IMMEDIATE MEDICAL CARE IF:   You have chest pain or a fast or irregular heartbeat.  You have a loss of feeling in some part of your body, or you lose movement in your arms or legs.  You have trouble speaking.  You become sweaty or feel lightheaded.  You  faint. MAKE SURE YOU:   Understand these instructions.  Will watch your condition.  Will get help right away if you are not doing well or get worse. Document Released: 05/03/2005 Document Revised: 02/21/2013 Document Reviewed: 11/03/2012 [facility] Patient Information 2015 [place], [place]. This information is not intended to replace advice  given to you by your health care provider. Make sure you discuss any questions you have with your health care provider.    Limit fried foods.  Cook foods using methods other than frying.  Limit canned vegetables. If you do use them, rinse them well to decrease the sodium.  When eating at a restaurant, ask that your food be prepared with less salt, or no salt if possible. WHAT FOODS CAN I EAT? Seek help from a dietitian for individual calorie needs. Grains Whole grain or whole wheat bread. Brown rice. Whole grain or whole wheat pasta. Quinoa, bulgur, and whole grain cereals. Low-sodium cereals. Corn or whole wheat flour tortillas. Whole grain cornbread. Whole grain crackers. Low-sodium crackers. Vegetables Fresh or frozen vegetables (raw, steamed, roasted, or grilled). Low-sodium or reduced-sodium tomato and vegetable juices. Low-sodium or reduced-sodium tomato sauce and paste. Low-sodium or reduced-sodium canned vegetables.  Fruits All fresh, canned (in natural juice), or frozen fruits. Meat and Other Protein Products Ground beef (85% or leaner), grass-fed beef, or beef trimmed of fat. Skinless chicken or [place]. Ground chicken or [place]. Pork trimmed of fat. All fish and seafood. Eggs. Dried beans, peas, or lentils. Unsalted nuts and seeds. Unsalted canned beans. Dairy Low-fat dairy products, such as skim or 1% milk, 2% or reduced-fat cheeses, low-fat ricotta or cottage cheese, or plain low-fat yogurt. Low-sodium or reduced-sodium cheeses. Fats and Oils Tub margarines without trans fats. Light or reduced-fat mayonnaise and salad dressings (reduced sodium). Avocado. Safflower, olive, or canola oils. Natural peanut or almond butter. Other Unsalted popcorn and pretzels. The items listed above may not be a complete list of recommended foods or beverages. Contact your dietitian for more options. WHAT FOODS ARE NOT RECOMMENDED? Grains White bread. White pasta. White rice. Refined cornbread.  Bagels and croissants. Crackers that contain trans fat. Vegetables Creamed or fried vegetables. Vegetables in a cheese sauce. Regular canned vegetables. Regular canned tomato sauce and paste. Regular tomato and vegetable juices. Fruits Dried fruits. Canned fruit in light or heavy syrup. Fruit juice. Meat and Other Protein Products Fatty cuts of meat. Ribs, chicken wings, bacon, sausage, bologna, salami, chitterlings, fatback, hot dogs, bratwurst, and packaged luncheon meats. Salted nuts and seeds. Canned beans with salt. Dairy Whole or 2% milk, cream, half-and-half, and cream cheese. Whole-fat or sweetened yogurt. Full-fat cheeses or blue cheese. Nondairy creamers and whipped toppings. Processed cheese, cheese spreads, or cheese curds. Condiments Onion and garlic salt, seasoned salt, table salt, and sea salt. Canned and packaged gravies. Worcestershire sauce. Tartar sauce. Barbecue sauce. Teriyaki sauce. Soy sauce, including reduced sodium. Steak sauce. Fish sauce. Oyster sauce. Cocktail sauce. Horseradish. Ketchup and mustard. Meat flavorings and tenderizers. Bouillon cubes. Hot sauce. Tabasco sauce. Marinades. Taco seasonings. Relishes. Fats and Oils Butter, stick margarine, lard, shortening, ghee, and bacon fat. Coconut, palm kernel, or palm oils. Regular salad dressings. Other Pickles and olives. Salted popcorn and pretzels. The items listed above may not be a complete list of foods and beverages to avoid. Contact your dietitian for more information. WHERE CAN I FIND MORE INFORMATION? National Heart, Lung, and Blood Institute: travelstabloid.com Document Released: 04/22/2011 Document Revised: 09/17/2013 Document Reviewed: 03/07/2013 [facility] Patient Information 2015 [place], [place]. This  information is not intended to replace advice given to you by your health care provider. Make sure you discuss any questions you have with your health care provider.   Stop  taking HCTZ medication but continue Losartan 25 mg tab Return in 1 week for nurse visit to recheck blood pressure

## 2014-05-14 NOTE — Progress Notes (Unsigned)
Patient ID: Leah Hampton, female   DOB: 08-11-1955, 58 y.o.   MRN: [id number] Pt comes in today for blood pressure recheck after started on Losartan 25 mg tab/ HCTZ 12.5 mg tab per last visit 05/01/14. Pt states she is taking bp meds daily with watching Sodium intake Denies dizziness or feeling fatigue today BP- 98/64 93 manually Will consult with Valerie,NP-Tikita Mabee,RN

## 2014-05-27 ENCOUNTER — Ambulatory Visit: Payer: Medicaid Other | Attending: Internal Medicine | Admitting: *Deleted

## 2014-05-27 VITALS — BP 117/73 | HR 79 | Temp 97.9°F | Resp 18

## 2014-05-27 DIAGNOSIS — E785 Hyperlipidemia, unspecified: Secondary | ICD-10-CM | POA: Diagnosis not present

## 2014-05-27 DIAGNOSIS — R059 Cough, unspecified: Secondary | ICD-10-CM

## 2014-05-27 DIAGNOSIS — E669 Obesity, unspecified: Secondary | ICD-10-CM | POA: Insufficient documentation

## 2014-05-27 DIAGNOSIS — I10 Essential (primary) hypertension: Secondary | ICD-10-CM | POA: Insufficient documentation

## 2014-05-27 DIAGNOSIS — R05 Cough: Secondary | ICD-10-CM

## 2014-05-27 MED ORDER — OMEPRAZOLE 20 MG PO CPDR: 20.0000 mg | DELAYED_RELEASE_CAPSULE | Freq: Every day | ORAL | Status: DC

## 2014-05-27 MED ORDER — ASPIRIN 81 MG PO TABS: 81.0000 mg | ORAL_TABLET | Freq: Every day | ORAL | Status: DC

## 2014-05-27 NOTE — Patient Instructions (Signed)
DASH Eating Plan °DASH stands for "Dietary Approaches to Stop Hypertension." The DASH eating plan is a healthy eating plan that has been shown to reduce high blood pressure (hypertension). Additional health benefits may include reducing the risk of type 2 diabetes mellitus, heart disease, and stroke. The DASH eating plan may also help with weight loss. °WHAT DO I NEED TO KNOW ABOUT THE DASH EATING PLAN? °For the DASH eating plan, you will follow these general guidelines: °· Choose foods with a percent daily value for sodium of less than 5% (as listed on the food label). °· Use salt-free seasonings or herbs instead of table salt or sea salt. °· Check with your health care provider or pharmacist before using salt substitutes. °· Eat lower-sodium products, often labeled as "lower sodium" or "no salt added." °· Eat fresh foods. °· Eat more vegetables, fruits, and low-fat dairy products. °· Choose whole grains. Look for the word "whole" as the first word in the ingredient list. °· Choose fish and skinless chicken or turkey more often than red meat. Limit fish, poultry, and meat to 6 oz (170 g) each day. °· Limit sweets, desserts, sugars, and sugary drinks. °· Choose heart-healthy fats. °· Limit cheese to 1 oz (28 g) per day. °· Eat more home-cooked food and less restaurant, buffet, and fast food. °· Limit fried foods. °· Cook foods using methods other than frying. °· Limit canned vegetables. If you do use them, rinse them well to decrease the sodium. °· When eating at a restaurant, ask that your food be prepared with less salt, or no salt if possible. °WHAT FOODS CAN I EAT? °Seek help from a dietitian for individual calorie needs. °Grains °Whole grain or whole wheat bread. Brown rice. Whole grain or whole wheat pasta. Quinoa, bulgur, and whole grain cereals. Low-sodium cereals. Corn or whole wheat flour tortillas. Whole grain cornbread. Whole grain crackers. Low-sodium crackers. °Vegetables °Fresh or frozen vegetables  (raw, steamed, roasted, or grilled). Low-sodium or reduced-sodium tomato and vegetable juices. Low-sodium or reduced-sodium tomato sauce and paste. Low-sodium or reduced-sodium canned vegetables.  °Fruits °All fresh, canned (in natural juice), or frozen fruits. °Meat and Other Protein Products °Ground beef (85% or leaner), grass-fed beef, or beef trimmed of fat. Skinless chicken or turkey. Ground chicken or turkey. Pork trimmed of fat. All fish and seafood. Eggs. Dried beans, peas, or lentils. Unsalted nuts and seeds. Unsalted canned beans. °Dairy °Low-fat dairy products, such as skim or 1% milk, 2% or reduced-fat cheeses, low-fat ricotta or cottage cheese, or plain low-fat yogurt. Low-sodium or reduced-sodium cheeses. °Fats and Oils °Tub margarines without trans fats. Light or reduced-fat mayonnaise and salad dressings (reduced sodium). Avocado. Safflower, olive, or canola oils. Natural peanut or almond butter. °Other °Unsalted popcorn and pretzels. °The items listed above may not be a complete list of recommended foods or beverages. Contact your dietitian for more options. °WHAT FOODS ARE NOT RECOMMENDED? °Grains °White bread. White pasta. White rice. Refined cornbread. Bagels and croissants. Crackers that contain trans fat. °Vegetables °Creamed or fried vegetables. Vegetables in a cheese sauce. Regular canned vegetables. Regular canned tomato sauce and paste. Regular tomato and vegetable juices. °Fruits °Dried fruits. Canned fruit in light or heavy syrup. Fruit juice. °Meat and Other Protein Products °Fatty cuts of meat. Ribs, chicken wings, bacon, sausage, bologna, salami, chitterlings, fatback, hot dogs, bratwurst, and packaged luncheon meats. Salted nuts and seeds. Canned beans with salt. °Dairy °Whole or 2% milk, cream, half-and-half, and cream cheese. Whole-fat or sweetened yogurt. Full-fat   cheeses or blue cheese. Nondairy creamers and whipped toppings. Processed cheese, cheese spreads, or cheese  curds. °Condiments °Onion and garlic salt, seasoned salt, table salt, and sea salt. Canned and packaged gravies. Worcestershire sauce. Tartar sauce. Barbecue sauce. Teriyaki sauce. Soy sauce, including reduced sodium. Steak sauce. Fish sauce. Oyster sauce. Cocktail sauce. Horseradish. Ketchup and mustard. Meat flavorings and tenderizers. Bouillon cubes. Hot sauce. Tabasco sauce. Marinades. Taco seasonings. Relishes. °Fats and Oils °Butter, stick margarine, lard, shortening, ghee, and bacon fat. Coconut, palm kernel, or palm oils. Regular salad dressings. °Other °Pickles and olives. Salted popcorn and pretzels. °The items listed above may not be a complete list of foods and beverages to avoid. Contact your dietitian for more information. °WHERE CAN I FIND MORE INFORMATION? °National Heart, Lung, and Blood Institute: www.nhlbi.nih.gov/health/health-topics/topics/dash/ °Document Released: 04/22/2011 Document Revised: 09/17/2013 Document Reviewed: 03/07/2013 °Exit[employer]® Patient Information ©2015 [employer]. This information is not intended to replace advice given to you by your health care provider. Make sure you discuss any questions you have with your health care provider. ° °

## 2014-05-27 NOTE — Progress Notes (Signed)
Patient presents for BP check Med list reviewed; states taking all meds as directed Discussed need for low sodium diet and using Mrs. Dash as alternative to salt  BP 117/73 P 79 R 18  T  97.9 oral SPO2  98%  Patient provided information on DASH Eating Plan  Patient advised to call for med refills at least 7 days before running out so as not to go without. Patient aware that she is to f/u with PCP 3 months from last visit (Due 07/31/14)

## 2014-05-28 ENCOUNTER — Telehealth: Payer: Self-pay | Admitting: Internal Medicine

## 2014-05-28 NOTE — Telephone Encounter (Signed)
Per PCP, patient may continue on HCTZ and stay off losartan. Patient notified Patient states since she just received a refill of losartan and has only 2 tabs left of HCTZ; states she will finish those 2 tabs and restart losartan this Friday. Appt made for 06/14/14 nurse visit for BP check following restart of losartan  PCP made aware

## 2014-05-28 NOTE — Telephone Encounter (Signed)
Patient is calling to clarify her blood pressure medication, patient states that Losartan interacts with her but she was given a refill for the medication yesterday (05/28/2014). Please f/u with pt.

## 2014-05-28 NOTE — Telephone Encounter (Signed)
Returned patient's call. Pt states she stopped losartan on 05/14/14 after BP check instead of HCTZ. Did not realize she was to do the opposite. BP at yesterday's nurse visit 117/73 P 79 Message sent to PCP regarding which med patient should continue. Awaiting response

## 2014-05-29 ENCOUNTER — Other Ambulatory Visit: Payer: Self-pay | Admitting: Internal Medicine

## 2014-05-29 DIAGNOSIS — R059 Cough, unspecified: Secondary | ICD-10-CM

## 2014-05-29 DIAGNOSIS — R05 Cough: Secondary | ICD-10-CM

## 2014-05-29 MED ORDER — ALBUTEROL SULFATE HFA 108 (90 BASE) MCG/ACT IN AERS: 2.0000 | INHALATION_SPRAY | Freq: Four times a day (QID) | RESPIRATORY_TRACT | Status: DC | PRN

## 2014-05-30 ENCOUNTER — Ambulatory Visit: Payer: Self-pay | Attending: Internal Medicine

## 2014-06-13 ENCOUNTER — Ambulatory Visit: Payer: Medicaid Other | Attending: Internal Medicine | Admitting: Internal Medicine

## 2014-06-13 ENCOUNTER — Encounter: Payer: Self-pay | Admitting: Internal Medicine

## 2014-06-13 VITALS — BP 130/82 | HR 85 | Temp 98.2°F | Resp 16 | Ht 64.0 in | Wt 297.0 lb

## 2014-06-13 DIAGNOSIS — E785 Hyperlipidemia, unspecified: Secondary | ICD-10-CM | POA: Diagnosis not present

## 2014-06-13 DIAGNOSIS — I10 Essential (primary) hypertension: Secondary | ICD-10-CM | POA: Insufficient documentation

## 2014-06-13 DIAGNOSIS — Z7982 Long term (current) use of aspirin: Secondary | ICD-10-CM | POA: Diagnosis not present

## 2014-06-13 DIAGNOSIS — M1990 Unspecified osteoarthritis, unspecified site: Secondary | ICD-10-CM | POA: Diagnosis not present

## 2014-06-13 DIAGNOSIS — Z23 Encounter for immunization: Secondary | ICD-10-CM | POA: Diagnosis not present

## 2014-06-13 DIAGNOSIS — E669 Obesity, unspecified: Secondary | ICD-10-CM | POA: Insufficient documentation

## 2014-06-13 DIAGNOSIS — Z882 Allergy status to sulfonamides status: Secondary | ICD-10-CM | POA: Insufficient documentation

## 2014-06-13 DIAGNOSIS — Z79899 Other long term (current) drug therapy: Secondary | ICD-10-CM | POA: Diagnosis not present

## 2014-06-13 DIAGNOSIS — J441 Chronic obstructive pulmonary disease with (acute) exacerbation: Secondary | ICD-10-CM | POA: Insufficient documentation

## 2014-06-13 DIAGNOSIS — R05 Cough: Secondary | ICD-10-CM | POA: Diagnosis present

## 2014-06-13 DIAGNOSIS — Z885 Allergy status to narcotic agent status: Secondary | ICD-10-CM | POA: Diagnosis not present

## 2014-06-13 MED ORDER — BECLOMETHASONE DIPROPIONATE 40 MCG/ACT IN AERS: 2.0000 | INHALATION_SPRAY | Freq: Two times a day (BID) | RESPIRATORY_TRACT | Status: DC

## 2014-06-13 MED ORDER — GUAIFENESIN ER 600 MG PO TB12: 600.0000 mg | ORAL_TABLET | Freq: Two times a day (BID) | ORAL | Status: DC | PRN

## 2014-06-13 NOTE — Patient Instructions (Signed)
Chronic Asthmatic Bronchitis Chronic asthmatic bronchitis is a complication of persistent asthma. After a period of time with asthma, some people develop airflow obstruction that is present all the time, even when not having an asthma attack.There is also persistent inflammation of the airways, and the bronchial tubes produce more mucus. Chronic asthmatic bronchitis usually is a permanent problem with the lungs. CAUSES  Chronic asthmatic bronchitis happens most often in people who have asthma and also smoke cigarettes. Occasionally, it can happen to a person with long-standing or severe asthma even if the person is not a smoker. SIGNS AND SYMPTOMS  Chronic asthmatic bronchitis usually causes symptoms of both asthma and chronic bronchitis, including:   Coughing.  Increased sputum production.  Wheezing and shortness of breath.  Chest discomfort.  Recurring infections. DIAGNOSIS  Your health care provider will take a medical history and perform a physical exam. Chronic asthmatic bronchitis is suspected when a person with asthma has abnormal results on breathing tests (pulmonary function tests) even when breathing symptoms are at their best. Other tests, such as a chest X-ray, may be performed to rule out other conditions.  TREATMENT  Treatment involves controlling symptoms with medicine and lifestyle changes.  Your health care provider may prescribe asthma medicines, including inhaler and nebulizer medicines.  Infection can be treated with medicine to kill germs (antibiotics). Serious infections may require hospitalization. These can include:  Pneumonia.  Sinus infections.  Acute bronchitis.   Preventing infection and hospitalization is very important. Get an influenza vaccination every year as directed by your health care provider. Ask your health care provider whether you need a pneumonia vaccine.  Ask your health care provider whether you would benefit from a pulmonary  rehabilitation program. HOME CARE INSTRUCTIONS  Take medicines only as directed by your health care provider.  If you are a cigarette smoker, the most important thing that you can do is quit. Talk to your health care provider for help with quitting smoking.  Avoid pollen, dust, animal dander, molds, smoke, and other things that cause attacks.  Regular exercise is very important to help you feel better. Discuss possible exercise routines with your health care provider.  If animal dander is the cause of asthma, you may not be able to keep pets.  It is important that you:  Become educated about your medical condition.  Participate in maintaining wellness.  Seek medical care as directed. Delay in seeking medical care could cause permanent injury and may be a risk to your life. SEEK MEDICAL CARE IF:  You have wheezing and shortness of breath even if taking medicine to prevent attacks.  You have muscle aches, chest pain, or thickening of sputum.  Your sputum changes from clear or white to yellow, green, gray, or bloody. SEEK IMMEDIATE MEDICAL CARE IF:  Your usual medicines do not stop your wheezing.  You have increased coughing or shortness of breath or both.  You have increased difficulty breathing.  You have any problems from the medicine you are taking, such as a rash, itching, swelling, or trouble breathing. MAKE SURE YOU:   Understand these instructions.  Will watch your condition.  Will get help right away if you are not doing well or get worse. Document Released: 02/18/2006 Document Revised: 09/17/2013 Document Reviewed: 06/11/2013 [employer] Patient Information 2015 [employer]. This information is not intended to replace advice given to you by your health care provider. Make sure you discuss any questions you have with your health care provider.  

## 2014-06-13 NOTE — Progress Notes (Signed)
Pt is here today b/c she has not had much progress with her bronchitis. Pt reports having a hard time sleeping. Pt has extreme chronic pain in her right knee.

## 2014-06-13 NOTE — Progress Notes (Signed)
Patient ID: Leah Hampton, female   DOB: 09-29-1955, 59 y.o.   MRN: [id number]  CC: cough, sleep concerns, right knee pain  HPI: Leah Hampton is a 59 y.o. female here today for a follow up visit.  Patient has past medical history of hypertension, obesity, osteoarthritis, and bronchitis.  Patient reports that she has not noticed much improvement in cough since beginning back on her inhaler last month.  She reports more coughing while in church. She has spells where she coughs uncontrollably. She has used all of the robitussin previously given to her and states that she did not notice a difference. She has the coughing more with laying flat. This cough has been present for 8 months. She is currently living with her sister who smokes multiple cigarettes per day. She has been taking the omeprazole daily and does not have heartburn.   She has severe osteoarthritis in her right knee and was told that she needs a knee replacement. 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 Difficulty sleeping mainly due to cough  Patient has No headache, No chest pain, No abdominal pain - No Nausea, No new weakness tingling or numbness.  Allergies  Allergen Reactions  . Sulfamethoxazole-Trimethoprim [Bactrim] Itching  . Codeine     REACTION: nausea and vomiting   Past Medical History  Diagnosis Date  . Obesity   . Cystitis   . Hyperbilirubinemia   . History of chest pain   . Hematuria     urologice eval, Dr Estill Dooms  . Renal insufficiency   . Leukocytosis   . Osteoarthritis   . Hypertelorism   . Hyperlipemia   . Bronchitis   . Hypertension    Current Outpatient Prescriptions on File Prior to Visit  Medication Sig Dispense Refill  . albuterol (PROVENTIL HFA;VENTOLIN HFA) 108 (90 BASE) MCG/ACT inhaler Inhale 2 puffs into the lungs every 6 (six) hours as needed for wheezing or shortness of breath. 3 Inhaler 3  . losartan (COZAAR) 25 MG tablet Take 1 tablet (25 mg total) by mouth daily. 30 tablet 3  .  omeprazole (PRILOSEC) 20 MG capsule Take 1 capsule (20 mg total) by mouth daily. 30 capsule 2  . pravastatin (PRAVACHOL) 40 MG tablet Take 1 tablet (40 mg total) by mouth daily. 90 tablet 1  . traMADol (ULTRAM) 50 MG tablet Take 50 mg by mouth every 6 (six) hours as needed for moderate pain.    Marland Kitchen aspirin 81 MG tablet Take 1 tablet (81 mg total) by mouth daily. (Patient not taking: Reported on 06/13/2014) 30 tablet 2  . azithromycin (ZITHROMAX) 250 MG tablet Take 1 tablet (250 mg total) by mouth daily. Take first 2 tablets together, then 1 every day until finished. (Patient not taking: Reported on 05/27/2014) 6 tablet 0  . benzonatate (TESSALON) 100 MG capsule Take 1 capsule (100 mg total) by mouth every 8 (eight) hours. (Patient not taking: Reported on 05/27/2014) 21 capsule 0  . guaiFENesin-dextromethorphan (ROBITUSSIN DM) 100-10 MG/5ML syrup Take 5 mLs by mouth every 4 (four) hours as needed for cough. (Patient not taking: Reported on 05/27/2014) 118 mL 0  . predniSONE (DELTASONE) 20 MG tablet 2 tabs po daily x 4 days (Patient not taking: Reported on 05/27/2014) 8 tablet 0   No current facility-administered medications on file prior to visit.   Family History  Problem Relation Age of Onset  . Diabetes    . Arthritis    . Stroke    . Coronary artery disease    .  Heart failure Sister     x 2  . Heart failure Mother   . Heart attack Father 61   History   Social History  . Marital Status: Single    Spouse Name: N/A    Number of Children: 2  . Years of Education: N/A   Occupational History  . Cook     at [employer] History Main Topics  . Smoking status: Never Smoker   . Smokeless tobacco: Never Used  . Alcohol Use: No  . Drug Use: No  . Sexual Activity: Not on file   Other Topics Concern  . Not on file   Social History Narrative   Regular exercise:  No          Review of Systems: See history of present illness   Objective:   Filed Vitals:    06/13/14 1610  BP: 130/82  Pulse: 85  Temp: 98.2 F (36.8 C)  Resp: 16    Physical Exam  Constitutional: She is oriented to person, place, and time.  Cardiovascular: Normal rate, regular rhythm and normal heart sounds.   Pulmonary/Chest: Effort normal and breath sounds normal. She has no wheezes.  Musculoskeletal: She exhibits no edema.  Neurological: She is alert and oriented to person, place, and time.   .  Lab Results  Component Value Date   WBC 6.9 03/10/2014   HGB 12.9 03/10/2014   HCT 39.1 03/10/2014   MCV 85.2 03/10/2014   PLT 255 03/10/2014   Lab Results  Component Value Date   CREATININE 0.73 03/10/2014   BUN 10 03/10/2014   NA 142 03/10/2014   K 3.7 03/10/2014   CL 103 03/10/2014   CO2 27 03/10/2014    Lab Results  Component Value Date   HGBA1C 6.2* 09/07/2013   Lipid Panel     Component Value Date/Time   CHOL 210* 09/07/2013 1034   TRIG 63 09/07/2013 1034   HDL 56 09/07/2013 1034   CHOLHDL 3.8 09/07/2013 1034   VLDL 13 09/07/2013 1034   LDLCALC 141* 09/07/2013 1034       Assessment and plan:   Deneene was seen today for follow-up.  Diagnoses and all orders for this visit:  Obstructive chronic bronchitis with exacerbation Orders:  beclomethasone (QVAR) 40 MCG/ACT inhaler; Inhale 2 puffs into the lungs 2 (two) times daily. -     guaiFENesin (MUCINEX) 600 MG 12 hr tablet; Take 1 tablet (600 mg total) by mouth 2 (two) times daily as needed. For cough/congestion  Need for prophylactic vaccination against Streptococcus pneumoniae (pneumococcus) Orders: -     Pneumococcal polysaccharide vaccine 23-valent greater than or equal to 2yo subcutaneous/IM  Return if symptoms worsen or fail to improve.       Chari Manning, NP-C [facility] [phone] 06/13/2014, 4:41 PM

## 2014-07-03 ENCOUNTER — Other Ambulatory Visit: Payer: Self-pay | Admitting: Internal Medicine

## 2014-07-03 DIAGNOSIS — J441 Chronic obstructive pulmonary disease with (acute) exacerbation: Secondary | ICD-10-CM

## 2014-07-03 MED ORDER — BECLOMETHASONE DIPROPIONATE 40 MCG/ACT IN AERS: 2.0000 | INHALATION_SPRAY | Freq: Two times a day (BID) | RESPIRATORY_TRACT | Status: DC

## 2014-08-22 ENCOUNTER — Encounter: Payer: Self-pay | Admitting: Internal Medicine

## 2014-08-22 ENCOUNTER — Ambulatory Visit: Payer: Medicaid Other | Attending: Internal Medicine | Admitting: Internal Medicine

## 2014-08-22 VITALS — BP 142/83 | HR 95 | Temp 98.4°F | Resp 16 | Ht 64.0 in | Wt 300.0 lb

## 2014-08-22 DIAGNOSIS — R05 Cough: Secondary | ICD-10-CM

## 2014-08-22 DIAGNOSIS — Z1211 Encounter for screening for malignant neoplasm of colon: Secondary | ICD-10-CM

## 2014-08-22 DIAGNOSIS — M25561 Pain in right knee: Secondary | ICD-10-CM

## 2014-08-22 DIAGNOSIS — E785 Hyperlipidemia, unspecified: Secondary | ICD-10-CM | POA: Diagnosis not present

## 2014-08-22 DIAGNOSIS — I10 Essential (primary) hypertension: Secondary | ICD-10-CM | POA: Diagnosis not present

## 2014-08-22 DIAGNOSIS — Z6841 Body Mass Index (BMI) 40.0 and over, adult: Secondary | ICD-10-CM | POA: Diagnosis not present

## 2014-08-22 DIAGNOSIS — R059 Cough, unspecified: Secondary | ICD-10-CM

## 2014-08-22 MED ORDER — OMEPRAZOLE 20 MG PO CPDR: 20.0000 mg | DELAYED_RELEASE_CAPSULE | Freq: Every day | ORAL | Status: DC

## 2014-08-22 MED ORDER — LOSARTAN POTASSIUM 25 MG PO TABS: 25.0000 mg | ORAL_TABLET | Freq: Every day | ORAL | Status: DC

## 2014-08-22 MED ORDER — PRAVASTATIN SODIUM 40 MG PO TABS: 40.0000 mg | ORAL_TABLET | Freq: Every day | ORAL | Status: DC

## 2014-08-22 NOTE — Patient Instructions (Signed)
Exercise to Lose Weight Exercise and a healthy diet may help you lose weight. Your doctor may suggest specific exercises. EXERCISE IDEAS AND TIPS  Choose low-cost things you enjoy doing, such as walking, bicycling, or exercising to workout videos.  Take stairs instead of the elevator.  Walk during your lunch break.  Park your car further away from work or school.  Go to a gym or an exercise class.  Start with 5 to 10 minutes of exercise each day. Build up to 30 minutes of exercise 4 to 6 days a week.  Wear shoes with good support and comfortable clothes.  Stretch before and after working out.  Work out until you breathe harder and your heart beats faster.  Drink extra water when you exercise.  Do not do so much that you hurt yourself, feel dizzy, or get very short of breath. Exercises that burn about 150 calories:  Running 1  miles in 15 minutes.  Playing volleyball for 45 to 60 minutes.  Washing and waxing a car for 45 to 60 minutes.  Playing touch football for 45 minutes.  Walking 1  miles in 35 minutes.  Pushing a stroller 1  miles in 30 minutes.  Playing basketball for 30 minutes.  Raking leaves for 30 minutes.  Bicycling 5 miles in 30 minutes.  Walking 2 miles in 30 minutes.  Dancing for 30 minutes.  Shoveling snow for 15 minutes.  Swimming laps for 20 minutes.  Walking up stairs for 15 minutes.  Bicycling 4 miles in 15 minutes.  Gardening for 30 to 45 minutes.  Jumping rope for 15 minutes.  Washing windows or floors for 45 to 60 minutes. Document Released: 06/05/2010 Document Revised: 07/26/2011 Document Reviewed: 06/05/2010 [employer] Patient Information 2015 [employer]. This information is not intended to replace advice given to you by your health care provider. Make sure you discuss any questions you have with your health care provider.  

## 2014-08-22 NOTE — Progress Notes (Signed)
Patient ID: Leah Hampton, female   DOB: 03-07-1956, 59 y.o.   MRN: [id number] . Subjective:  Leah Hampton is a 59 y.o. female with hypertension.  4 years ago colonoscopy, recently received a letter for a repeat. Went [facility]--would like to go back to same location. Current Outpatient Prescriptions  Medication Sig Dispense Refill  . albuterol (PROVENTIL HFA;VENTOLIN HFA) 108 (90 BASE) MCG/ACT inhaler Inhale 2 puffs into the lungs every 6 (six) hours as needed for wheezing or shortness of breath. 3 Inhaler 3  . aspirin 81 MG tablet Take 1 tablet (81 mg total) by mouth daily. 30 tablet 2  . losartan (COZAAR) 25 MG tablet Take 1 tablet (25 mg total) by mouth daily. 30 tablet 3  . omeprazole (PRILOSEC) 20 MG capsule Take 1 capsule (20 mg total) by mouth daily. 30 capsule 2  . pravastatin (PRAVACHOL) 40 MG tablet Take 1 tablet (40 mg total) by mouth daily. 90 tablet 1  . beclomethasone (QVAR) 40 MCG/ACT inhaler Inhale 2 puffs into the lungs 2 (two) times daily. (Patient not taking: Reported on 08/22/2014) 3 Inhaler 3  . guaiFENesin (MUCINEX) 600 MG 12 hr tablet Take 1 tablet (600 mg total) by mouth 2 (two) times daily as needed. For cough/congestion (Patient not taking: Reported on 08/22/2014) 60 tablet 1  . traMADol (ULTRAM) 50 MG tablet Take 50 mg by mouth every 6 (six) hours as needed for moderate pain.     No current facility-administered medications for this visit.    Hypertension ROS: taking medications as instructed, no medication side effects noted, no TIA's, no chest pain on exertion, no dyspnea on exertion, no swelling of ankles and no palpitations.  New concerns: States that she believes that her BP is elevated because she ate some really salty fast food one hour before coming to appointment.   Objective:  BP 142/83 mmHg  Pulse 95  Temp(Src) 98.4 F (36.9 C) (Oral)  Resp 16  Ht 5\' 4"  (1.626 m)  Wt 300 lb (136.079 kg)  BMI 51.47 kg/m2  SpO2 98%  LMP 11/10/2011  Appearance  alert, well appearing, and in no distress, oriented to person, place, and time and overweight. General exam BP noted to be borderline elevated today in office, S1, S2 normal, no gallop, no murmur, chest clear, no JVD, no HSM, no edema, CVS exam  - normal rate, regular rhythm, normal S1, S2, no murmurs, rubs, clicks or gallops.  Lab review: orders written for new lab studies as appropriate; see orders.   Assessment:   Hypertension stable.   Plan:  Repeat labs ordered prior to next appointment. Reviewed diet, exercise and weight control. Recommended sodium restriction. Cardiovascular risk and specific lipid/LDL goals reviewed. Follow up: 6 months and as needed.Marland Kitchen  HLD (hyperlipidemia) Orders: -     pravastatin (PRAVACHOL) 40 MG tablet; Take 1 tablet (40 mg total) by mouth daily. -     Lipid panel; Future Education provided on proper lifestyle changes in order to lower cholesterol. Patient advised to maintain healthy weight and to keep total fat intake at 25-35% of total calories and carbohydrates 50-60% of total daily calories. Explained how high cholesterol places patient at risk for heart disease. Patient placed on appropriate medication and repeat labs in 6 months   Morbid obesity with BMI of 50.0-59.9, adult Weight loss discussed at length and its complications to health.  Patient will loss 10 months by next visit in 3 months.  Diet and exercise discussed as well as  calorie intake. I have addressed how weight is adding to co-morbidities, knee pain and may eventually lead to DM.  Cough Orders: -     omeprazole (PRILOSEC) 20 MG capsule; Take 1 capsule (20 mg total) by mouth daily.  Knee pain, acute, right Orders: -     AMB referral to orthopedics Strongly advised weight loss in patient to assist with pressure on joints   Colon cancer screening Orders: -     [facility] COLONOSCOPY Will send order for follow up testing   Return in about 1 week (around 08/29/2014) for Lab Visit and 6 mo PCP  HTN.  >50% visit counseling patient on lifestyle changes to prevent serious complications   Chari Manning, NP 08/25/2014 10:17 PM

## 2014-08-22 NOTE — Progress Notes (Signed)
Pt is here following up on her HTN. Pt is needing her medications refilled.

## 2014-09-03 ENCOUNTER — Ambulatory Visit: Payer: Medicaid Other | Attending: Internal Medicine

## 2014-09-03 DIAGNOSIS — I10 Essential (primary) hypertension: Secondary | ICD-10-CM

## 2014-09-03 DIAGNOSIS — E785 Hyperlipidemia, unspecified: Secondary | ICD-10-CM

## 2014-09-03 LAB — LIPID PANEL
Cholesterol: 169 mg/dL (ref 0–200)
HDL: 49 mg/dL (ref >=46)
LDL Cholesterol: 103 mg/dL — ABNORMAL HIGH (ref 0–99)
TRIGLYCERIDES: 87 mg/dL (ref <150)
Total CHOL/HDL Ratio: 3.4 Ratio
VLDL: 17 mg/dL (ref 0–40)

## 2014-09-03 LAB — COMPLETE METABOLIC PANEL WITH GFR
ALBUMIN: 3.6 g/dL (ref 3.5–5.2)
ALK PHOS: 85 U/L (ref 39–117)
ALT: 16 U/L (ref 0–35)
AST: 19 U/L (ref 0–37)
BILIRUBIN TOTAL: 0.3 mg/dL (ref 0.2–1.2)
BUN: 12 mg/dL (ref 6–23)
CO2: 27 meq/L (ref 19–32)
Calcium: 9.1 mg/dL (ref 8.4–10.5)
Chloride: 108 mEq/L (ref 96–112)
Creat: 0.69 mg/dL (ref 0.50–1.10)
GFR, Est African American: >89 mL/min
GFR, Est Non African American: >89 mL/min
Glucose, Bld: 93 mg/dL (ref 70–99)
Potassium: 4.2 mEq/L (ref 3.5–5.3)
SODIUM: 142 meq/L (ref 135–145)
TOTAL PROTEIN: 6.7 g/dL (ref 6.0–8.3)

## 2014-09-03 LAB — CBC
HEMATOCRIT: 37.9 % (ref 36.0–46.0)
Hemoglobin: 12.2 g/dL (ref 12.0–15.0)
MCH: 28 pg (ref 26.0–34.0)
MCHC: 32.2 g/dL (ref 30.0–36.0)
MCV: 87.1 fL (ref 78.0–100.0)
MPV: 9.7 fL (ref 8.6–12.4)
Platelets: 293 10*3/uL (ref 150–400)
RBC: 4.35 MIL/uL (ref 3.87–5.11)
RDW: 15.2 % (ref 11.5–15.5)
WBC: 6.7 10*3/uL (ref 4.0–10.5)

## 2014-09-06 ENCOUNTER — Other Ambulatory Visit: Payer: Self-pay

## 2014-09-06 ENCOUNTER — Telehealth: Payer: Self-pay | Admitting: *Deleted

## 2014-09-06 DIAGNOSIS — Z1231 Encounter for screening mammogram for malignant neoplasm of breast: Secondary | ICD-10-CM

## 2014-09-06 NOTE — Telephone Encounter (Signed)
Pt is aware of her lab results.  

## 2014-09-06 NOTE — Telephone Encounter (Signed)
-----   Message from Lance Bosch, NP sent at 09/04/2014 12:19 AM EDT ----- Cholesterol improved a ton. Still just a touch to high on bad cholesterol. Continue medication and exericse.

## 2014-09-10 ENCOUNTER — Ambulatory Visit: Payer: Medicaid Other | Admitting: Sports Medicine

## 2014-09-18 ENCOUNTER — Telehealth: Payer: Self-pay | Admitting: Internal Medicine

## 2014-09-18 ENCOUNTER — Ambulatory Visit: Payer: Medicaid Other | Attending: Internal Medicine | Admitting: Family Medicine

## 2014-09-18 ENCOUNTER — Encounter: Payer: Self-pay | Admitting: Family Medicine

## 2014-09-18 ENCOUNTER — Ambulatory Visit: Payer: Medicaid Other | Admitting: Internal Medicine

## 2014-09-18 VITALS — BP 156/85 | HR 100 | Temp 98.0°F | Resp 15 | Wt 295.2 lb

## 2014-09-18 DIAGNOSIS — L299 Pruritus, unspecified: Secondary | ICD-10-CM | POA: Insufficient documentation

## 2014-09-18 DIAGNOSIS — E663 Overweight: Secondary | ICD-10-CM | POA: Diagnosis not present

## 2014-09-18 DIAGNOSIS — I10 Essential (primary) hypertension: Secondary | ICD-10-CM | POA: Insufficient documentation

## 2014-09-18 MED ORDER — PREDNISONE 20 MG PO TABS: 20.0000 mg | ORAL_TABLET | Freq: Every day | ORAL | Status: DC

## 2014-09-18 NOTE — Progress Notes (Signed)
Patient complains of feeling itchy to her arms legs and feet that started about Two weeks ago Patient denies any change in soaps or detergents Patient did state she recently moved into a new apartment Patient has tried cortisone 10 cream with no relief Patient is currently taking benadryl with no relief

## 2014-09-18 NOTE — Patient Instructions (Addendum)
Bedbugs  Bedbugs are tiny bugs that live in and around beds. During the day, they hide in mattresses and other places near beds. They come out at night and bite people lying in bed. They need blood to live and grow. Bedbugs can be found in beds anywhere. Usually, they are found in places where many people come and go (hotels, shelters, hospitals). It does not matter whether the place is dirty or clean.  Getting bitten by bedbugs rarely causes a medical problem. The biggest problem can be getting rid of them.  This often takes the work of a pest control expert.  CAUSES  · Less use of pesticides. Bedbugs were common before the 1950s. Then, strong pesticides such as DDT nearly wiped them out. Today, these pesticides are not used because they harm the environment and can cause health problems.  · More travel. Besides mattresses, bedbugs can also live in clothing and luggage. They can come along as people travel from place to place. Bedbugs are more common in certain parts of the world. When people travel to those areas, the bugs can come home with them.  · Presence of birds and bats. Bedbugs often infest birds and bats. If you have these animals in or near your home, bedbugs may infest your house, too.  SYMPTOMS  It does not hurt to be bitten by a bedbug. You will probably not wake up when you are bitten. Bedbugs usually bite areas of the skin that are not covered. Symptoms may show when you wake up, or they may take a day or more to show up. Symptoms may include:  · Small red bumps on the skin. These might be lined up in a row or clustered in a group.  · A darker red dot in the middle of red bumps.  · Blisters on the skin. There may be swelling and very bad itching. These may be signs of an allergic reaction. This does not happen often.  DIAGNOSIS  Bedbug bites might look and feel like other types of insect bites. The bugs do not stay on the body like ticks or lice. They bite, drop off, and crawl away to hide. Your  caregiver will probably:  · Ask about your symptoms.  · Ask about your recent activities and travel.  · Check your skin for bedbug bites.  · Ask you to check at home for signs of bedbugs. You should look for:  ¨ Spots or stains on the bed or nearby. This could be from bedbugs that were crushed or from their eggs or waste.  ¨ Bedbugs themselves. They are reddish-brown, oval, and flat. They do not fly. They are about the size of an apple seed.  · Places to look for bedbugs include:  ¨ Beds. Check mattresses, headboards, box springs, and bed frames.  ¨ On drapes and curtains near the bed.  ¨ Under carpeting in the bedroom.  ¨ Behind electrical outlets.  ¨ Behind any wallpaper that is peeling.  ¨ Inside luggage.  TREATMENT  Most bedbug bites do not need treatment. They usually go away on their own in a few days. The bites are not dangerous. However, treatment may be needed if you have scratched so much that your skin has become infected. You may also need treatment if you are allergic to bedbug bites. Treatment options include:  · A drug that stops swelling and itching (corticosteroid). Usually, a cream is rubbed on the skin. If you have a bad rash, you may be   Take any medicine prescribed by your caregiver for your bites. Follow the directions carefully.  Consider wearing pajamas with long sleeves and pant legs.  Your bedroom may need to be treated. A pest control expert should make sure the bedbugs are gone. You may need to throw away mattresses or luggage. Ask the pest control expert what you can do to keep the bedbugs from coming back. Common suggestions include:  Putting a plastic cover over your mattress.  Washing and drying your clothes and bedding in hot water and a hot dryer. The temperature should be hotter  than 120 F (48.9 C). Bedbugs are killed by high temperatures.  Vacuuming carefully all around your bed. Vacuum in all cracks and crevices where the bugs might hide. Do this often.  Carefully checking all used furniture, bedding, or clothes that you bring into your house.  Eliminating bird nests and bat roosts.  If you get bedbug bites when traveling, check all your possessions carefully before bringing them into your house. If you find any bugs on clothes or in your luggage, consider throwing those items away. SEEK MEDICAL CARE IF:  You have red bug bites that keep coming back.  You have red bug bites that itch badly.  You have bug bites that cause a skin rash.  You have scratch marks that are red and sore. SEEK IMMEDIATE MEDICAL CARE IF: You have a fever. Document Released: 06/05/2010 Document Revised: 07/26/2011 Document Reviewed: 06/05/2010 [facility] Patient Information 2015 [place], [place]. This information is not intended to replace advice given to you by your health care provider. Make sure you discuss any questions you have with your health care provider.   Take prednisone for 3 days. Follow-up instructions on handout for getting rid of bedbugs Follow-up if symptoms worsen or not resolving after taking measures to eliminate the bugs/

## 2014-09-18 NOTE — Telephone Encounter (Signed)
Patient called requesting to speak to nurse, please f/u with patient

## 2014-09-18 NOTE — Progress Notes (Signed)
Subjective:     Patient ID: Leah Hampton, female   DOB: February 28, 1956, 58 y.o.   MRN: [id number]  HPI   Patient presents today c/o of itching. She thinks she has been bitten. She has just moved into a new place and her furniture has been in storage. Her son lives with her and is having no problem. His furniture and be have not been in storage. Her itching started about 4 days ago after moving in to the new enviroment. She denies any other symptoms.  Review of Systems   Gen: No fever, chills HENT: No swelling of throat or tongue. No nasal congestion or sneezing. Lungs: No shortness of breath or coughing. Heart: No chest pain or palpitations GI: No N/V or diarrhea  Objective:   Physical Exam   General: Alert, oriented, appropriate, overweight HENT  TMS, Nose and throat are within normal limits Neck:  Supple FROM Lungs:  Clear to auscultation Heart;  HS regular wo m,g,r Skin:  A few scattered red bumps on arms and trunk.     Assessment:     1.pruritis, possible bedbug bites 2.Hypertension Blood pressure 158/85 today.    Plan:     1. I am prescribing prednisone 20 mg #3, one a day for 3 days.     I have provided a handout on treating the home to eliminate bedbugs.     She should follow-up if needed. 2. Will call and have her return for BP check in a couple of weeks.

## 2014-09-19 ENCOUNTER — Ambulatory Visit
Admission: RE | Admit: 2014-09-19 | Discharge: 2014-09-19 | Disposition: A | Discharge status: Home / Self Care | Payer: Medicaid Other | Source: Ambulatory Visit

## 2014-09-19 DIAGNOSIS — Z1231 Encounter for screening mammogram for malignant neoplasm of breast: Secondary | ICD-10-CM

## 2014-09-20 ENCOUNTER — Ambulatory Visit: Payer: Medicaid Other | Admitting: Internal Medicine

## 2014-09-23 ENCOUNTER — Telehealth: Payer: Self-pay | Admitting: *Deleted

## 2014-09-23 NOTE — Telephone Encounter (Signed)
-----   Message from Lance Bosch, NP sent at 09/22/2014 11:25 PM EDT ----- No evidence of malignancy on mammogram. Repeat in one year

## 2014-09-23 NOTE — Telephone Encounter (Signed)
Pt is aware of her MM results. 

## 2014-09-27 ENCOUNTER — Telehealth: Payer: Self-pay | Admitting: Internal Medicine

## 2014-09-27 NOTE — Telephone Encounter (Signed)
Patient came in to drop off application for disability parking placard. Placed in provider's box

## 2014-10-10 ENCOUNTER — Encounter: Payer: Self-pay | Admitting: Gastroenterology

## 2014-10-22 ENCOUNTER — Ambulatory Visit (INDEPENDENT_AMBULATORY_CARE_PROVIDER_SITE_OTHER): Payer: Medicaid Other | Admitting: Sports Medicine

## 2014-10-22 ENCOUNTER — Encounter: Payer: Self-pay | Admitting: Sports Medicine

## 2014-10-22 VITALS — BP 144/74 | HR 92 | Ht 64.0 in | Wt 300.0 lb

## 2014-10-22 DIAGNOSIS — M1711 Unilateral primary osteoarthritis, right knee: Secondary | ICD-10-CM | POA: Diagnosis present

## 2014-10-22 MED ORDER — METHYLPREDNISOLONE ACETATE 80 MG/ML IJ SUSP
80.0000 mg | Freq: Once | INTRAMUSCULAR | Status: AC
  Administered 2014-10-22: 80 mg via INTRA_ARTICULAR

## 2014-10-22 NOTE — Assessment & Plan Note (Addendum)
Severe medial compartment and PF compartment OA on 2013 x-rays -Offered updated X-rays to patient today, but she declined due to a transportation issue -Tolerated CST injection to right knee today -Discussed wt loss, gentle low impact exercise. Patient would like to join [employer], recumbent bike. BMI 51 today, not a surgical candidate. -Ice, nsaids only when needed with food -Plan follow-up in 3 mo or sooner prn.

## 2014-10-22 NOTE — Progress Notes (Signed)
   Subjective:    Patient ID: Leah Hampton, female    DOB: [birth year], 59 y.o.   MRN: [id number]  HPI Ms. Oetken is a 59 year old female who presents for follow-up of right knee pain. Her BMI is 51, and she is not a surgical candidate for right total knee replacement at this time. She has had previous cortisone injections and Visco supplementation, last performed in 2013. X-rays at that time showed bone-on-bone end-stage osteoarthritis of the right knee with medial and patellofemoral compartment narrowing. She has had a left total knee arthroplasty with Dr. French Ana previously, which she says has done very well. She denies any history of diabetes. She says that the right knee has been worsening over the past few months without any known acute injury. She denies any knee giving way. Occasionally the knee will feel full. She notes associated catching and popping. Her pain occasionally wakes her up at night. She denies any fevers, chills, numbness, or tingling.  She feels that she is interested in losing weight. She thinks that she would like to join [employer] and would enjoy the recumbent bike. She last saw Dr. French Ana a year ago for follow-up of left knee total arthroplasty, who also discussed weight loss strategies with her.  Past medical history, social history, medications, and allergies were reviewed and are up to date in the chart.  Review of Systems 7 point review of systems was performed and was otherwise negative unless noted in the history of present illness.     Objective:   Physical Exam BP 144/74 mmHg  Pulse 92  Ht 5\' 4"  (1.626 m)  Wt 300 lb (136.079 kg)  BMI 51.47 kg/m2  LMP 11/10/2011 GEN: The patient is well-developed morbidly obese female in no acute distress.  She is awake alert and oriented x3. SKIN: warm and well-perfused, no rash  EXTR: No lower extremity edema or calf tenderness Neuro: Strength 5/5 globally. Sensation intact throughout. No focal deficits. Vasc:  +2 bilateral distal pulses. No edema.  MSK: Examination of the right knee reveals range of motion from 5-115 with pain at the endpoints of motion. She has medial and lateral joint line tenderness and crepitation. No overlying erythema, warmth, or induration. Positive patellar grind test.  Right Knee Injection Procedure:  Risks, benefits and complications were reviewed with the patient. After obtaining informed verbal consent the right knee was sterilely prepped with alcohol.  Ethyl chloride was used to anesthetize the skin and intra-articular injection using a 25-gauge by 1-1/2 inch needle was accomplished without difficulty.  We injected 80mg  Depo Medrol with 3 cc 1% lidocaine without epinephrine and 3 cc 0.5% marcaine.  Patient tolerated the procedure well and was observed for 5-10 min. Post injection instructions, including signs and symptoms of complications were discussed.     Assessment & Plan:  Please see problem based assessment and plan in the problem list.

## 2014-11-21 ENCOUNTER — Ambulatory Visit: Payer: Medicaid Other | Attending: Internal Medicine | Admitting: Internal Medicine

## 2014-11-21 VITALS — BP 136/80 | HR 78 | Temp 98.1°F | Ht 64.0 in | Wt 296.6 lb

## 2014-11-21 DIAGNOSIS — E669 Obesity, unspecified: Secondary | ICD-10-CM | POA: Diagnosis not present

## 2014-11-21 DIAGNOSIS — Z7982 Long term (current) use of aspirin: Secondary | ICD-10-CM | POA: Diagnosis not present

## 2014-11-21 DIAGNOSIS — R319 Hematuria, unspecified: Secondary | ICD-10-CM | POA: Diagnosis present

## 2014-11-21 DIAGNOSIS — K219 Gastro-esophageal reflux disease without esophagitis: Secondary | ICD-10-CM | POA: Diagnosis not present

## 2014-11-21 DIAGNOSIS — I10 Essential (primary) hypertension: Secondary | ICD-10-CM

## 2014-11-21 DIAGNOSIS — Z79899 Other long term (current) drug therapy: Secondary | ICD-10-CM | POA: Diagnosis not present

## 2014-11-21 DIAGNOSIS — E785 Hyperlipidemia, unspecified: Secondary | ICD-10-CM | POA: Diagnosis not present

## 2014-11-21 DIAGNOSIS — Z7952 Long term (current) use of systemic steroids: Secondary | ICD-10-CM | POA: Insufficient documentation

## 2014-11-21 LAB — POCT URINALYSIS DIPSTICK
Bilirubin, UA: NEGATIVE
GLUCOSE UA: NEGATIVE
Ketones, UA: NEGATIVE
Leukocytes, UA: NEGATIVE
NITRITE UA: NEGATIVE
Spec Grav, UA: >=1.03
UROBILINOGEN UA: 1
pH, UA: 6

## 2014-11-21 MED ORDER — LOSARTAN POTASSIUM 25 MG PO TABS: 25.0000 mg | ORAL_TABLET | Freq: Every day | ORAL | Status: DC

## 2014-11-21 MED ORDER — PRAVASTATIN SODIUM 40 MG PO TABS: 40.0000 mg | ORAL_TABLET | Freq: Every day | ORAL | Status: DC

## 2014-11-21 MED ORDER — OMEPRAZOLE 20 MG PO CPDR: 20.0000 mg | DELAYED_RELEASE_CAPSULE | Freq: Every day | ORAL | Status: DC

## 2014-11-21 NOTE — Patient Instructions (Signed)

## 2014-11-21 NOTE — Progress Notes (Signed)
Patient ID: Leah Hampton, female   DOB: [birth year], 57 y.o.   MRN: [id number]  CC: blood when wiping   HPI: Leah Hampton is a 59 y.o. female here today for a follow up visit.  Patient has past medical history of obesity, HLD, cystitis, and hematuria. Patient reports that she was seen by a urologist 3 years ago but does not remember having blood in her urine at that time. She went for stress incontinence at that time. She first noticed pink blood after wiping last week. She states that she also noticed blood yesterday after wiping but has not seen any in her undergarments. She is postmenopausal and is sure the blood is not vaginal.    Patient has No headache, No chest pain, No abdominal pain - No Nausea, No new weakness tingling or numbness, No Cough - SOB.  Allergies  Allergen Reactions  . Sulfamethoxazole-Trimethoprim [Bactrim] Itching  . Codeine     REACTION: nausea and vomiting   Past Medical History  Diagnosis Date  . Obesity   . Cystitis   . Hyperbilirubinemia   . History of chest pain   . Hematuria     urologice eval, Dr Estill Dooms  . Renal insufficiency   . Leukocytosis   . Osteoarthritis   . Hypertelorism   . Hyperlipemia   . Bronchitis   . Hypertension    Current Outpatient Prescriptions on File Prior to Visit  Medication Sig Dispense Refill  . albuterol (PROVENTIL HFA;VENTOLIN HFA) 108 (90 BASE) MCG/ACT inhaler Inhale 2 puffs into the lungs every 6 (six) hours as needed for wheezing or shortness of breath. 3 Inhaler 3  . aspirin 81 MG tablet Take 1 tablet (81 mg total) by mouth daily. 30 tablet 2  . beclomethasone (QVAR) 40 MCG/ACT inhaler Inhale 2 puffs into the lungs 2 (two) times daily. 3 Inhaler 3  . guaiFENesin (MUCINEX) 600 MG 12 hr tablet Take 1 tablet (600 mg total) by mouth 2 (two) times daily as needed. For cough/congestion (Patient not taking: Reported on 08/22/2014) 60 tablet 1  . losartan (COZAAR) 25 MG tablet Take 1 tablet (25 mg total) by mouth daily. 30  tablet 3  . omeprazole (PRILOSEC) 20 MG capsule Take 1 capsule (20 mg total) by mouth daily. 30 capsule 2  . pravastatin (PRAVACHOL) 40 MG tablet Take 1 tablet (40 mg total) by mouth daily. 90 tablet 1  . predniSONE (DELTASONE) 20 MG tablet Take 1 tablet (20 mg total) by mouth daily with breakfast. For 3 days. (Patient not taking: Reported on 10/22/2014) 3 tablet 0  . traMADol (ULTRAM) 50 MG tablet Take 50 mg by mouth every 6 (six) hours as needed for moderate pain.     No current facility-administered medications on file prior to visit.   Family History  Problem Relation Age of Onset  . Diabetes    . Arthritis    . Stroke    . Coronary artery disease    . Heart failure Sister     x 2  . Heart failure Mother   . Heart attack Father 63   History   Social History  . Marital Status: Single    Spouse Name: N/A  . Number of Children: 2  . Years of Education: N/A   Occupational History  . Cook     at [employer] History Main Topics  . Smoking status: Never Smoker   . Smokeless tobacco: Never Used  . Alcohol Use: No  .  Drug Use: No  . Sexual Activity: Not on file   Other Topics Concern  . Not on file   Social History Narrative   Regular exercise:  No          Review of Systems  Constitutional: Negative for fever and chills.  Gastrointestinal: Positive for heartburn.  Genitourinary: Positive for hematuria. Negative for dysuria, urgency, frequency and flank pain.  Neurological: Negative for dizziness and headaches.  All other systems reviewed and are negative.  Objective:   Filed Vitals:   11/21/14 1428  BP: 136/80  Pulse: 78  Temp: 98.1 F (36.7 C)    Physical Exam  Cardiovascular: Normal rate, regular rhythm and normal heart sounds.   Pulmonary/Chest: Effort normal and breath sounds normal.  Abdominal: Soft. Bowel sounds are normal. She exhibits no distension. There is no tenderness.  No CVA tenderness   Neurological: She is alert.      Lab Results  Component Value Date   WBC 6.7 09/03/2014   HGB 12.2 09/03/2014   HCT 37.9 09/03/2014   MCV 87.1 09/03/2014   PLT 293 09/03/2014   Lab Results  Component Value Date   CREATININE 0.69 09/03/2014   BUN 12 09/03/2014   NA 142 09/03/2014   K 4.2 09/03/2014   CL 108 09/03/2014   CO2 27 09/03/2014    Lab Results  Component Value Date   HGBA1C 6.2* 09/07/2013   Lipid Panel     Component Value Date/Time   CHOL 169 09/03/2014 0930   TRIG 87 09/03/2014 0930   HDL 49 09/03/2014 0930   CHOLHDL 3.4 09/03/2014 0930   VLDL 17 09/03/2014 0930   LDLCALC 103* 09/03/2014 0930       Assessment and plan:   Miaya was seen today for hematuria.  Diagnoses and all orders for this visit:  Hematuria Orders: -     Urinalysis Dipstick---+ hematuria  -     Ambulatory referral to Urology  HLD (hyperlipidemia) Orders: -     Refill pravastatin (PRAVACHOL) 40 MG tablet; Take 1 tablet (40 mg total) by mouth daily.  Essential hypertension Orders: -     Refill losartan (COZAAR) 25 MG tablet; Take 1 tablet (25 mg total) by mouth daily.  Gastroesophageal reflux disease without esophagitis Orders: -     Refill omeprazole (PRILOSEC) 20 MG capsule; Take 1 capsule (20 mg total) by mouth daily.  Return in about 6 months (around 05/24/2015) for Hypertension.      Lance Bosch, [facility] [phone] 11/21/2014, 2:32 PM

## 2014-11-21 NOTE — Progress Notes (Signed)
Patient states no menstrual cycle in 3-4 years but reports pink tinged blood when she wipes after urination No pain

## 2014-12-01 ENCOUNTER — Encounter: Payer: Self-pay | Admitting: Internal Medicine

## 2014-12-26 ENCOUNTER — Telehealth: Payer: Self-pay | Admitting: Internal Medicine

## 2014-12-26 ENCOUNTER — Telehealth: Payer: Self-pay

## 2014-12-26 NOTE — Telephone Encounter (Signed)
Patient called requesting medication refill on losartan (COZAAR) 25 MG tablet. Please f/u with patient

## 2014-12-26 NOTE — Telephone Encounter (Signed)
Returned patient phone call Patient requesting a refill  On her b/p medications Made patient aware that she still has five refills and to call pharmacy to Get refilled

## 2015-01-09 ENCOUNTER — Telehealth: Payer: Self-pay | Admitting: Internal Medicine

## 2015-01-09 NOTE — Telephone Encounter (Signed)
Pt. Would like a referral for a Colonoscopy....please f/u

## 2015-01-16 ENCOUNTER — Other Ambulatory Visit: Payer: Self-pay

## 2015-01-16 DIAGNOSIS — Z0000 Encounter for general adult medical examination without abnormal findings: Secondary | ICD-10-CM

## 2015-01-17 ENCOUNTER — Encounter: Payer: Self-pay | Admitting: Gastroenterology

## 2015-02-07 ENCOUNTER — Ambulatory Visit (INDEPENDENT_AMBULATORY_CARE_PROVIDER_SITE_OTHER): Payer: Medicaid Other | Admitting: Family Medicine

## 2015-02-07 ENCOUNTER — Encounter: Payer: Self-pay | Admitting: Family Medicine

## 2015-02-07 VITALS — BP 137/72 | HR 84 | Ht 64.0 in | Wt 290.0 lb

## 2015-02-07 DIAGNOSIS — M1711 Unilateral primary osteoarthritis, right knee: Secondary | ICD-10-CM

## 2015-02-07 DIAGNOSIS — M25561 Pain in right knee: Secondary | ICD-10-CM | POA: Diagnosis present

## 2015-02-07 MED ORDER — IBUPROFEN 600 MG PO TABS: 600.0000 mg | ORAL_TABLET | Freq: Three times a day (TID) | ORAL | Status: DC | PRN

## 2015-02-07 MED ORDER — METHYLPREDNISOLONE ACETATE 80 MG/ML IJ SUSP
80.0000 mg | Freq: Once | INTRAMUSCULAR | Status: AC
  Administered 2015-02-07: 80 mg via INTRA_ARTICULAR

## 2015-02-07 MED ORDER — DICLOFENAC SODIUM 75 MG PO TBEC: 75.0000 mg | DELAYED_RELEASE_TABLET | Freq: Two times a day (BID) | ORAL | Status: DC

## 2015-02-07 NOTE — Assessment & Plan Note (Signed)
Again patient has severe medial compartment and patellofemoral compartment osteoarthritis based on 2013 x-rays and clinical exam. - Again declined repeat x-rays today. - Right knee injection performed today without complication. Although the last one did not provide her any relief, she wanted to at least try one more corticosteroid injection. - We also discussed ibuprofen being the optimal choice for her knee pain if that is helpful. We've prescribed her 600 mg tablets of ibuprofen and she is to take that as needed. Her kidney function is good based on a recent BMP. - Again discussed the importance of weight loss for her overall health as well as her knee pain. She wants to join the [employer] and do water aerobics, which may be easier on her joints.  - We also referred her to orthopedics as it is unlikely she will get great benefit from corticosteroid injection based on her last response. They may consider viscous supplementation or surgery - Lastly we did give her prescription for Voltaren gel, which may or may not be beneficial based on her body habitus.  If this is too expensive we can send a prescription to a compounding pharmacy for a similar mixture. - Follow up as needed

## 2015-02-07 NOTE — Progress Notes (Signed)
Leah Hampton - 59 y.o. female MRN [id number]  Date of birth: [birth year]  CC: Right knee pain  SUBJECTIVE:   HPI [name] is a 59 year old female who comes for follow-up of right knee pain. She has severe osteoarthritis of the right knee. She previously had a left total knee replacement done in 59 y.o. female who comes for follow-up of right knee pain. She has severe osteoarthritis of the right knee. She previously had a left total knee replacement done in 59 and is doing great. She last had a corticosteroid injection into the right knee on 57/11/2014. Works that she really does not gain any relief from that. She also has several medication she takes for her right knee pain. She has prescriptions for tramadol and 600 mg ibuprofen. She also has a prescription for Norco for a total procedure in the past. She usually takes either the Norco or the tramadol for her knee pain. He provides great relief. She does not usually take ibuprofen as she did not realize that may help it. She has not yet been to [employer] as she had planned. She is not doing any exercises at the moment. The pain does wake her up at night. She denies any instability. Hisknee swelling or locking. She denies having any side effects from the injection 3+ months ago.   ROS:     7 point review of systems negative other than that listed in history of present illness in regard to musculoskeletal issue  HISTORY: Past Medical, Surgical, Social, and Family History Reviewed & Updated per EMR.  Pertinent Historical Findings include: Left total knee arthroplasty in 2010. She is not diabetic and she does not have a reduced GFR upon review of her last labs (her last A1c in 2015 was 6.2, but her most recent BMP shows a glucose of 92.).  OBJECTIVE: BP 137/72 mmHg  Pulse 84  Ht 5\' 4"  (1.626 m)  Wt 290 lb (131.543 kg)  BMI 49.75 kg/m2  LMP 11/10/2011  Physical Exam  GEN: The patient is well-developed morbidly obese female in no acute distress. She is awake alert and oriented x3. SKIN: warm and well-perfused, no rash  EXTR: No lower extremity edema or calf tenderness Neuro: Strength 5/5  globally. Sensation intact throughout. No focal deficits. Vasc: +2 bilateral distal pulses. No edema.  MSK: Examination of the right knee reveals range of motion from 5-115 with a terminal range of motion. She has medial  joint line tenderness and crepitation. No overlying erythema, warmth, or induration. Positive patellar grind test. Mildly tender along the lateral patellar border. Significant adiposity but does not appear to have good quad muscle tone  Right Knee Injection Procedure: Risks, benefits and complications were reviewed with the patient. After obtaining informed verbal consent the right knee was sterilely prepped with alcohol. Ethyl chloride was used to anesthetize the skin and intra-articular injection using a 25-gauge by 1-1/2 inch needle was accomplished without difficulty. We injected 80mg  Depo Medrol with 4 cc 1% lidocaine without epinephrine.  Patient tolerated the procedure well and was observed for 5-10 min. Post injection instructions, including signs and symptoms of complications were discussed.  MEDICATIONS, LABS & OTHER ORDERS: Previous Medications   ALBUTEROL (PROVENTIL HFA;VENTOLIN HFA) 108 (90 BASE) MCG/ACT INHALER    Inhale 2 puffs into the lungs every 6 (six) hours as needed for wheezing or shortness of breath.   ASPIRIN 81 MG TABLET    Take 1 tablet (81 mg total) by mouth daily.   BECLOMETHASONE (QVAR) 40 MCG/ACT INHALER    Inhale 2 puffs into the lungs 2 (two) times daily.   GUAIFENESIN (MUCINEX) 600 MG 12 HR TABLET  Take 1 tablet (600 mg total) by mouth 2 (two) times daily as needed. For cough/congestion   HYDROCODONE-ACETAMINOPHEN (NORCO/VICODIN) 5-325 MG PER TABLET    Take 1 tablet by mouth every 6 (six) hours as needed for moderate pain.   IBUPROFEN (ADVIL,MOTRIN) 600 MG TABLET    Take 600 mg by mouth every 6 (six) hours as needed.   LOSARTAN (COZAAR) 25 MG TABLET    Take 1 tablet (25 mg total) by mouth daily.   OMEPRAZOLE (PRILOSEC) 20 MG CAPSULE    Take 1  capsule (20 mg total) by mouth daily.   PENICILLIN V POTASSIUM (VEETID) 500 MG TABLET    Take 500 mg by mouth 4 (four) times daily.   PRAVASTATIN (PRAVACHOL) 40 MG TABLET    Take 1 tablet (40 mg total) by mouth daily.   PREDNISONE (DELTASONE) 20 MG TABLET    Take 1 tablet (20 mg total) by mouth daily with breakfast. For 3 days.   TRAMADOL (ULTRAM) 50 MG TABLET    Take 50 mg by mouth every 6 (six) hours as needed for moderate pain.   Modified Medications   No medications on file   New Prescriptions   No medications on file   Discontinued Medications   No medications on file  No orders of the defined types were placed in this encounter.   ASSESSMENT & PLAN: See problem based charting & AVS for pt instructions.

## 2015-02-09 NOTE — Progress Notes (Signed)
Patient ID: Leah Hampton, female   DOB: [birth year], 59 y.o.   MRN: [id number] [facility]: Attending Note: I have reviewed the chart, discussed wit the Sports Medicine Fellow. I agree with assessment and treatment plan as detailed in the [place] note.

## 2015-02-24 ENCOUNTER — Telehealth: Payer: Self-pay | Admitting: Internal Medicine

## 2015-02-24 NOTE — Telephone Encounter (Signed)
Patient called stating that [place] needs to speak with the PCP before they are able to schedule an appointment. Please f/u at  [phone]

## 2015-02-25 ENCOUNTER — Telehealth: Payer: Self-pay

## 2015-02-25 NOTE — Telephone Encounter (Signed)
Patient is aware i spoke with ortho and she can now call and Schedule her appointment

## 2015-02-25 NOTE — Telephone Encounter (Signed)
Spoke with [facility] Their office needed our NPI number as well as number of visits Information given and patient can now schedule and appt.

## 2015-03-04 ENCOUNTER — Other Ambulatory Visit: Payer: Self-pay | Admitting: Internal Medicine

## 2015-03-11 ENCOUNTER — Telehealth: Payer: Self-pay

## 2015-03-11 ENCOUNTER — Ambulatory Visit (AMBULATORY_SURGERY_CENTER): Payer: Self-pay

## 2015-03-11 VITALS — Ht 62.5 in | Wt 302.0 lb

## 2015-03-11 DIAGNOSIS — Z860100 Personal history of colon polyps, unspecified: Secondary | ICD-10-CM

## 2015-03-11 MED ORDER — SUPREP BOWEL PREP KIT 17.5-3.13-1.6 GM/177ML PO SOLN: 1.0000 | Freq: Once | ORAL | Status: DC

## 2015-03-11 NOTE — Telephone Encounter (Signed)
Morbid obesity with BMI >50 precludes surveillance colonoscopy from being done in [place].  Will need to be at hospital, +MAC sedation, my next available Thursday (not during a hosp week).  For polyp surveillance ("overdue")

## 2015-03-11 NOTE — Telephone Encounter (Signed)
BMI >54  Thanks, Angela/PV

## 2015-03-11 NOTE — Telephone Encounter (Signed)
Dr Ardis Hughs please review for BMI > 54

## 2015-03-11 NOTE — Progress Notes (Signed)
No allergies to eggs or soy No diet/weight loss meds No home oxygen No past problems with anesthesia  Has internet; refused emmi; has had before

## 2015-03-18 ENCOUNTER — Other Ambulatory Visit: Payer: Self-pay

## 2015-03-18 DIAGNOSIS — Z86010 Personal history of colonic polyps: Secondary | ICD-10-CM

## 2015-03-18 MED ORDER — NA SULFATE-K SULFATE-MG SULF 17.5-3.13-1.6 GM/177ML PO SOLN: 1.0000 | Freq: Once | ORAL | Status: DC

## 2015-03-18 NOTE — Telephone Encounter (Signed)
Appt has been changed to 03/27/15 915 am instructions have been mailed to the pt.  Left message on machine to call back to verbally review the new date and time as well as instructions

## 2015-03-19 ENCOUNTER — Encounter (HOSPITAL_COMMUNITY): Payer: Self-pay | Admitting: *Deleted

## 2015-03-25 ENCOUNTER — Encounter: Payer: Medicaid Other | Admitting: Gastroenterology

## 2015-03-25 NOTE — Telephone Encounter (Signed)
Pt has been instructed and all questions answered, she verbally stated understanding and will call with any further concerns

## 2015-03-27 ENCOUNTER — Ambulatory Visit (HOSPITAL_COMMUNITY): Payer: Medicaid Other | Admitting: Certified Registered Nurse Anesthetist

## 2015-03-27 ENCOUNTER — Ambulatory Visit (HOSPITAL_COMMUNITY)
Admission: RE | Admit: 2015-03-27 | Discharge: 2015-03-27 | Disposition: A | Discharge status: Home / Self Care | Payer: Medicaid Other | Source: Ambulatory Visit | Attending: Gastroenterology | Admitting: Gastroenterology

## 2015-03-27 ENCOUNTER — Encounter (HOSPITAL_COMMUNITY)
Admission: RE | Disposition: A | Discharge status: Home / Self Care | Payer: Self-pay | Source: Ambulatory Visit | Attending: Gastroenterology

## 2015-03-27 ENCOUNTER — Encounter (HOSPITAL_COMMUNITY): Payer: Self-pay

## 2015-03-27 DIAGNOSIS — Z6841 Body Mass Index (BMI) 40.0 and over, adult: Secondary | ICD-10-CM | POA: Diagnosis not present

## 2015-03-27 DIAGNOSIS — I10 Essential (primary) hypertension: Secondary | ICD-10-CM | POA: Diagnosis not present

## 2015-03-27 DIAGNOSIS — M1990 Unspecified osteoarthritis, unspecified site: Secondary | ICD-10-CM | POA: Insufficient documentation

## 2015-03-27 DIAGNOSIS — Z1211 Encounter for screening for malignant neoplasm of colon: Secondary | ICD-10-CM | POA: Diagnosis present

## 2015-03-27 DIAGNOSIS — E785 Hyperlipidemia, unspecified: Secondary | ICD-10-CM | POA: Insufficient documentation

## 2015-03-27 DIAGNOSIS — G4730 Sleep apnea, unspecified: Secondary | ICD-10-CM | POA: Insufficient documentation

## 2015-03-27 DIAGNOSIS — Z86010 Personal history of colonic polyps: Secondary | ICD-10-CM | POA: Diagnosis not present

## 2015-03-27 DIAGNOSIS — Z96659 Presence of unspecified artificial knee joint: Secondary | ICD-10-CM | POA: Insufficient documentation

## 2015-03-27 HISTORY — PX: COLONOSCOPY WITH PROPOFOL: SHX5780

## 2015-03-27 SURGERY — COLONOSCOPY WITH PROPOFOL
Anesthesia: Monitor Anesthesia Care

## 2015-03-27 MED ORDER — LACTATED RINGERS IV SOLN
INTRAVENOUS | Status: DC
  Administered 2015-03-27: 09:00:00 via INTRAVENOUS

## 2015-03-27 MED ORDER — ONDANSETRON HCL 4 MG/2ML IJ SOLN
INTRAMUSCULAR | Status: AC
  Filled 2015-03-27: qty 2

## 2015-03-27 MED ORDER — LACTATED RINGERS IV SOLN: INTRAVENOUS | Status: DC

## 2015-03-27 MED ORDER — PROPOFOL 500 MG/50ML IV EMUL
INTRAVENOUS | Status: DC | PRN
  Administered 2015-03-27: 300 ug/kg/min via INTRAVENOUS

## 2015-03-27 MED ORDER — PROPOFOL 10 MG/ML IV BOLUS
INTRAVENOUS | Status: AC
  Filled 2015-03-27: qty 20

## 2015-03-27 MED ORDER — ONDANSETRON HCL 4 MG/2ML IJ SOLN
INTRAMUSCULAR | Status: DC | PRN
  Administered 2015-03-27: 4 mg via INTRAVENOUS

## 2015-03-27 MED ORDER — SODIUM CHLORIDE 0.9 % IV SOLN: INTRAVENOUS | Status: DC

## 2015-03-27 SURGICAL SUPPLY — 22 items

## 2015-03-27 NOTE — Discharge Instructions (Signed)
YOU HAD AN ENDOSCOPIC PROCEDURE TODAY: Refer to the procedure report that was given to you for any specific questions about what was found during the examination.  If the procedure report does not answer your questions, please call your gastroenterologist to clarify.  YOU SHOULD EXPECT: Some feelings of bloating in the abdomen. Passage of more gas than usual.  Walking can help get rid of the air that was put into your GI tract during the procedure and reduce the bloating. If you had a lower endoscopy (such as a colonoscopy or flexible sigmoidoscopy) you may notice spotting of blood in your stool or on the toilet paper.   DIET: Your first meal following the procedure should be a light meal and then it is ok to progress to your normal diet.  A half-sandwich or bowl of soup is an example of a good first meal.  Heavy or fried foods are harder to digest and may make you feel nasueas or bloated.  Drink plenty of fluids but you should avoid alcoholic beverages for 24 hours.  ACTIVITY: Your care partner should take you home directly after the procedure.  You should plan to take it easy, moving slowly for the rest of the day.  You can resume normal activity the day after the procedure however you should NOT DRIVE or use heavy machinery for 24 hours (because of the sedation medicines used during the test).    SYMPTOMS TO REPORT IMMEDIATELY  A gastroenterologist can be reached at any hour.  Please call your doctor's office for any of the following symptoms:   Following lower endoscopy (colonoscopy, flexible sigmoidoscopy)  Excessive amounts of blood in the stool  Significant tenderness, worsening of abdominal pains  Swelling of the abdomen that is new, acute  Fever of 100 or higher  Following upper endoscopy (EGD, EUS, ERCP)  Vomiting of blood or coffee ground material  New, significant abdominal pain  New, significant chest pain or pain under the shoulder blades  Painful or persistently difficult  swallowing  New shortness of breath  Black, tarry-looking stools  FOLLOW UP: If any biopsies were taken you will be contacted by phone or by letter within the next 1-3 weeks.  Call your gastroenterologist if you have not heard about the biopsies in 3 weeks.  Please also call your gastroenterologist's office with any specific questions about appointments or follow up tests.   Moderate Conscious Sedation, Adult, Care After Refer to this sheet in the next few weeks. These instructions provide you with information on caring for yourself after your procedure. Your health care provider may also give you more specific instructions. Your treatment has been planned according to current medical practices, but problems sometimes occur. Call your health care provider if you have any problems or questions after your procedure. WHAT TO EXPECT AFTER THE PROCEDURE  After your procedure:  You may feel sleepy, clumsy, and have poor balance for several hours.  Vomiting may occur if you eat too soon after the procedure. HOME CARE INSTRUCTIONS  Do not participate in any activities where you could become injured for at least 24 hours. Do not:  Drive.  Swim.  Ride a bicycle.  Operate heavy machinery.  Cook.  Use power tools.  Climb ladders.  Work from a high place.  Do not make important decisions or sign legal documents until you are improved.  If you vomit, drink water, juice, or soup when you can drink without vomiting. Make sure you have little or no nausea  before eating solid foods.  Only take over-the-counter or prescription medicines for pain, discomfort, or fever as directed by your health care provider.  Make sure you and your family fully understand everything about the medicines given to you, including what side effects may occur.  You should not drink alcohol, take sleeping pills, or take medicines that cause drowsiness for at least 24 hours.  If you smoke, do not smoke without  supervision.  If you are feeling better, you may resume normal activities 24 hours after you were sedated.  Keep all appointments with your health care provider. SEEK MEDICAL CARE IF:  Your skin is pale or bluish in color.  You continue to feel nauseous or vomit.  Your pain is getting worse and is not helped by medicine.  You have bleeding or swelling.  You are still sleepy or feeling clumsy after 24 hours. SEEK IMMEDIATE MEDICAL CARE IF:  You develop a rash.  You have difficulty breathing.  You develop any type of allergic problem.  You have a fever. MAKE SURE YOU:  Understand these instructions.  Will watch your condition.  Will get help right away if you are not doing well or get worse.   This information is not intended to replace advice given to you by your health care provider. Make sure you discuss any questions you have with your health care provider.   Document Released: 02/21/2013 Document Revised: 05/24/2014 Document Reviewed: 02/21/2013 Elsevier Interactive Patient Education [employer].

## 2015-03-27 NOTE — Anesthesia Preprocedure Evaluation (Signed)
Anesthesia Evaluation  Patient identified by MRN, date of birth, ID band Patient awake    Reviewed: Allergy & Precautions, NPO status , Patient's Chart, lab work & pertinent test results  History of Anesthesia Complications Negative for: history of anesthetic complications  Airway Mallampati: III  TM Distance: >3 FB Neck ROM: Full    Dental  (+) Teeth Intact   Pulmonary neg shortness of breath, sleep apnea , neg COPD, neg recent URI,    breath sounds clear to auscultation       Cardiovascular hypertension, Pt. on medications  Rhythm:Regular     Neuro/Psych negative neurological ROS  negative psych ROS   GI/Hepatic negative GI ROS, Neg liver ROS,   Endo/Other  Morbid obesity  Renal/GU negative Renal ROS     Musculoskeletal  (+) Arthritis ,   Abdominal   Peds  Hematology   Anesthesia Other Findings   Reproductive/Obstetrics                             Anesthesia Physical Anesthesia Plan  ASA: II  Anesthesia Plan: MAC   Post-op Pain Management:    Induction: Intravenous  Airway Management Planned: Natural Airway, Nasal Cannula and Simple Face Mask  Additional Equipment: None  Intra-op Plan:   Post-operative Plan:   Informed Consent: I have reviewed the patients History and Physical, chart, labs and discussed the procedure including the risks, benefits and alternatives for the proposed anesthesia with the patient or authorized representative who has indicated his/her understanding and acceptance.     Plan Discussed with: CRNA and Surgeon  Anesthesia Plan Comments:         Anesthesia Quick Evaluation

## 2015-03-27 NOTE — H&P (Signed)
  HPI: This is a woman s/p 2011 colonsocopy, 3 polyps removed (1 was 1.5cm). These were TAs.  Was recommended to have repeat colonsocopy at 3 year intervcal  Chief complaint is colon polyps    Past Medical History  Diagnosis Date  . Obesity   . Cystitis   . Hyperbilirubinemia   . History of chest pain   . Hematuria     urologice eval, Dr Estill Dooms  . Leukocytosis   . Osteoarthritis   . Hypertelorism   . Hyperlipemia   . Bronchitis   . Hypertension     Past Surgical History  Procedure Laterality Date  . Knee arthroscopy  04/2007    left knee  . Total knee arthroplasty  10/2008    Dr Roxine Caddy    Current Facility-Administered Medications  Medication Dose Route Frequency Provider Last Rate Last Dose  . 0.9 %  sodium chloride infusion   Intravenous Continuous Milus Banister, MD      . lactated ringers infusion   Intravenous Continuous Milus Banister, MD      . lactated ringers infusion   Intravenous Continuous Milus Banister, MD        Allergies as of 03/18/2015 - Review Complete 03/18/2015  Allergen Reaction Noted  . Sulfamethoxazole-trimethoprim [bactrim] Itching 08/31/2010  . Codeine  07/03/2008    Family History  Problem Relation Age of Onset  . Diabetes    . Arthritis    . Stroke    . Coronary artery disease    . Heart failure Sister     x 2  . Heart failure Mother   . Heart attack Father 23  . Colon cancer Neg Hx     Social History   Social History  . Marital Status: Single    Spouse Name: N/A  . Number of Children: 2  . Years of Education: N/A   Occupational History  . Cook     at [employer] History Main Topics  . Smoking status: Never Smoker   . Smokeless tobacco: Never Used  . Alcohol Use: No  . Drug Use: No  . Sexual Activity: Not on file   Other Topics Concern  . Not on file   Social History Narrative   Regular exercise:  No           Physical Exam: BP 155/83 mmHg  Pulse 87  Temp(Src) 98.5 F (36.9 C)  (Oral)  Resp 20  Ht 5' 2.5" (1.588 m)  Wt 300 lb (136.079 kg)  BMI 53.96 kg/m2  LMP 11/10/2011 Constitutional: generally well-appearing Psychiatric: alert and oriented x3 Abdomen: soft, nontender, nondistended, no obvious ascites, no peritoneal signs, normal bowel sounds   Assessment and plan: 59 y.o. female with HRAs removed 2011  For surveillance colonoscopy today.   Owens Loffler, MD [facility] 03/27/2015, 8:53 AM

## 2015-03-27 NOTE — Transfer of Care (Signed)
Immediate Anesthesia Transfer of Care Note  Patient: Leah Hampton  Procedure(s) Performed: Procedure(s): COLONOSCOPY WITH PROPOFOL (N/A)  Patient Location: PACU  Anesthesia Type:MAC  Level of Consciousness:  sedated, patient cooperative and responds to stimulation  Airway & Oxygen Therapy:Patient Spontanous Breathing and Patient connected to face mask oxgen  Post-op Assessment:  Report given to PACU RN and Post -op Vital signs reviewed and stable  Post vital signs:  Reviewed and stable  Last Vitals:  Filed Vitals:   03/27/15 0828  BP: 155/83  Pulse: 87  Temp: 36.9 C  Resp: 20    Complications: No apparent anesthesia complications

## 2015-03-27 NOTE — Op Note (Signed)
[facility] [place], [address]   COLONOSCOPY PROCEDURE REPORT  PATIENT: Leah Hampton, Leah Hampton  MR#: [id number] BIRTHDATE: 1955-12-14 , 23  yrs. old GENDER: female ENDOSCOPIST: Milus Banister, MD PROCEDURE DATE:  03/27/2015 PROCEDURE:   Colonoscopy, surveillance First Screening Colonoscopy - Avg.  risk and is 50 yrs.  old or older - No.  Prior Negative Screening - Now for repeat screening. N/A  History of Adenoma - Now for follow-up colonoscopy & has been > or = to 3 yrs.  Yes hx of adenoma.  Has been 3 or more years since last colonoscopy.  high risk ASA CLASS:   Class III INDICATIONS:Surveillance due to prior colonic neoplasia and Colonoscopy Dr.  Ardis Hughs 2011 found three polyps, TAs, one was >1cm.  MEDICATIONS: Monitored anesthesia care  DESCRIPTION OF PROCEDURE:   After the risks benefits and alternatives of the procedure were thoroughly explained, informed consent was obtained.  The digital rectal exam revealed no abnormalities of the rectum.   The Pentax Adult Colonscope [id number] endoscope was introduced through the anus and advanced to the cecum, which was identified by both the appendix and ileocecal valve. No adverse events experienced.   The quality of the prep was excellent.  The instrument was then slowly withdrawn as the colon was fully examined. Estimated blood loss is zero unless otherwise noted in this procedure report.    COLON FINDINGS: A normal appearing cecum, ileocecal valve, and appendiceal orifice were identified.  The ascending, transverse, descending, sigmoid colon, and rectum appeared unremarkable. Retroflexed views revealed no abnormalities. The time to cecum = 3 min Withdrawal time = 8 min   The scope was withdrawn and the procedure completed. COMPLICATIONS: There were no immediate complications.  ENDOSCOPIC IMPRESSION: Normal colonoscopy  RECOMMENDATIONS: Given your personal history of adenomatous (pre-cancerous)  polyps (HRA 2011), you will need a repeat colonoscopy in 5 years.  eSigned:  Milus Banister, MD 03/27/2015 9:42 AM

## 2015-03-27 NOTE — Anesthesia Postprocedure Evaluation (Signed)
  Anesthesia Post-op Note  Patient: Leah Hampton  Procedure(s) Performed: Procedure(s): COLONOSCOPY WITH PROPOFOL (N/A)  Patient Location: Endoscopy Unit  Anesthesia Type:MAC  Level of Consciousness: awake  Airway and Oxygen Therapy: Patient Spontanous Breathing  Post-op Pain: none  Post-op Assessment: Post-op Vital signs reviewed, Patient's Cardiovascular Status Stable, Respiratory Function Stable, Patent Airway, No signs of Nausea or vomiting and Pain level controlled              Post-op Vital Signs: Reviewed and stable  Last Vitals:  Filed Vitals:   03/27/15 1010  BP:   Pulse: 84  Temp:   Resp: 21    Complications: No apparent anesthesia complications

## 2015-03-28 ENCOUNTER — Encounter (HOSPITAL_COMMUNITY): Payer: Self-pay | Admitting: Gastroenterology

## 2015-04-04 ENCOUNTER — Ambulatory Visit: Payer: Medicaid Other | Admitting: Internal Medicine

## 2015-04-14 ENCOUNTER — Encounter: Payer: Self-pay | Admitting: Internal Medicine

## 2015-04-14 ENCOUNTER — Ambulatory Visit: Payer: Medicaid Other | Attending: Internal Medicine | Admitting: Internal Medicine

## 2015-04-14 VITALS — BP 142/84 | HR 84 | Temp 98.4°F | Resp 17 | Ht 63.0 in | Wt 305.9 lb

## 2015-04-14 DIAGNOSIS — Z0000 Encounter for general adult medical examination without abnormal findings: Secondary | ICD-10-CM | POA: Insufficient documentation

## 2015-04-14 DIAGNOSIS — I10 Essential (primary) hypertension: Secondary | ICD-10-CM | POA: Insufficient documentation

## 2015-04-14 DIAGNOSIS — R319 Hematuria, unspecified: Secondary | ICD-10-CM | POA: Insufficient documentation

## 2015-04-14 DIAGNOSIS — M7989 Other specified soft tissue disorders: Secondary | ICD-10-CM | POA: Insufficient documentation

## 2015-04-14 LAB — BASIC METABOLIC PANEL
BUN: 14 mg/dL (ref 7–25)
CALCIUM: 9.2 mg/dL (ref 8.6–10.4)
CO2: 29 mmol/L (ref 20–31)
Chloride: 106 mmol/L (ref 98–110)
Creat: 0.76 mg/dL (ref 0.50–1.05)
GLUCOSE: 99 mg/dL (ref 65–99)
POTASSIUM: 4.1 mmol/L (ref 3.5–5.3)
SODIUM: 144 mmol/L (ref 135–146)

## 2015-04-14 LAB — POCT URINALYSIS DIPSTICK
Bilirubin, UA: NEGATIVE
GLUCOSE UA: NEGATIVE
KETONES UA: NEGATIVE
Nitrite, UA: NEGATIVE
PROTEIN UA: NEGATIVE
Spec Grav, UA: 1.025
Urobilinogen, UA: 0.2
pH, UA: 6

## 2015-04-14 MED ORDER — LOSARTAN POTASSIUM 25 MG PO TABS: 25.0000 mg | ORAL_TABLET | Freq: Every day | ORAL | Status: DC

## 2015-04-14 MED ORDER — CIPROFLOXACIN HCL 500 MG PO TABS: 500.0000 mg | ORAL_TABLET | Freq: Two times a day (BID) | ORAL | Status: DC

## 2015-04-14 NOTE — Patient Instructions (Signed)
I have sent 3 days of antibiotics for the blood in your urine. Please make a follow up with Urology if blood persist

## 2015-04-14 NOTE — Progress Notes (Signed)
Patient ID: Leah Hampton, female   DOB: Apr 13, 1956, 59 y.o.   MRN: [id number]  CC: f/u and hematuria  HPI: Leah Hampton is a 59 y.o. female here today for a follow up visit.  Patient has past medical history of obesity, HLD, cystitis, and hematuria. She states that 3 days ago she began to notice blood in her urine again. Patient was evaluated 4 months ago for the same complaint and was referred to Urology. At that time she had a cystoscopy completed that was essentially normal. Patient was treated empirically with Ciprofloxacin and was cleared and told to follow up as needed. She denies symptoms of dysuria, flank pain, abdominal pain, fever, chills, nausea, or vomiting. She denies vaginal bleeding in over 3 years.  Patient reports that she takes her losartan daily without complications. She is concerned about swelling in her right leg that extends from the ankle up to above the knee. She is followed by Orthopedics but states that she cannot have a knee replacement until she loses 60 pounds. She has been taking Meloxicam for pain. She denies calf pain or tenderness.  Allergies  Allergen Reactions  . Sulfamethoxazole-Trimethoprim [Bactrim] Itching  . Codeine     REACTION: nausea and vomiting   Past Medical History  Diagnosis Date  . Obesity   . Cystitis   . Hyperbilirubinemia   . History of chest pain   . Hematuria     urologice eval, Dr Estill Dooms  . Leukocytosis   . Osteoarthritis   . Hypertelorism   . Hyperlipemia   . Bronchitis   . Hypertension    Current Outpatient Prescriptions on File Prior to Visit  Medication Sig Dispense Refill  . albuterol (PROVENTIL HFA;VENTOLIN HFA) 108 (90 BASE) MCG/ACT inhaler Inhale 2 puffs into the lungs every 6 (six) hours as needed for wheezing or shortness of breath. 3 Inhaler 3  . beclomethasone (QVAR) 40 MCG/ACT inhaler Inhale 2 puffs into the lungs 2 (two) times daily. 3 Inhaler 3  . losartan (COZAAR) 25 MG tablet Take 1 tablet (25 mg total) by  mouth daily. 30 tablet 5  . omeprazole (PRILOSEC) 20 MG capsule Take 1 capsule (20 mg total) by mouth daily. 30 capsule 5  . pravastatin (PRAVACHOL) 40 MG tablet TAKE 1 TABLET BY MOUTH DAILY 90 tablet 1  . chlorhexidine (PERIDEX) 0.12 % solution 15 mLs by Mouth Rinse route 2 (two) times daily as needed. After teeth cleaning  0  . HYDROcodone-acetaminophen (NORCO/VICODIN) 5-325 MG per tablet Take 1 tablet by mouth every 6 (six) hours as needed for moderate pain.    Marland Kitchen ibuprofen (ADVIL,MOTRIN) 600 MG tablet Take 1 tablet (600 mg total) by mouth every 8 (eight) hours as needed. (Patient taking differently: Take 600 mg by mouth every 8 (eight) hours as needed for mild pain. ) 45 tablet 0  . Na Sulfate-K Sulfate-Mg Sulf SOLN Take 1 kit by mouth once. 354 mL 0  . SUPREP BOWEL PREP SOLN Take 1 kit by mouth once. 354 mL 0  . traMADol (ULTRAM) 50 MG tablet Take 50 mg by mouth every 6 (six) hours as needed for moderate pain.     No current facility-administered medications on file prior to visit.   Family History  Problem Relation Age of Onset  . Diabetes    . Arthritis    . Stroke    . Coronary artery disease    . Heart failure Sister     x 2  . Heart failure Mother   .  Heart attack Father 45  . Colon cancer Neg Hx    Social History   Social History  . Marital Status: Single    Spouse Name: N/A  . Number of Children: 2  . 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 Smoking status: Never Smoker   . Smokeless tobacco: Never Used  . Alcohol Use: No  . Drug Use: No  . Sexual Activity: Not on file   Other Topics Concern  . Not on file   Social History Narrative   Regular exercise:  No          Review of Systems: Other than what is stated in HPI, all other systems are negative.   Objective:   Filed Vitals:   04/14/15 0951  BP: 142/84  Pulse: 84  Temp: 98.4 F (36.9 C)  Resp: 17    Physical  Exam  Constitutional: She is oriented to person, place, and time.  Neck: No JVD present.  Cardiovascular: Normal rate, regular rhythm and normal heart sounds.   Pulmonary/Chest: Effort normal and breath sounds normal.  Musculoskeletal: She exhibits edema. She exhibits no tenderness.  Swelling from right ankle up to knee Negative Homan's sign  Neurological: She is alert and oriented to person, place, and time.  Skin: Skin is dry.     Lab Results  Component Value Date   WBC 6.7 09/03/2014   HGB 12.2 09/03/2014   HCT 37.9 09/03/2014   MCV 87.1 09/03/2014   PLT 293 09/03/2014   Lab Results  Component Value Date   CREATININE 0.69 09/03/2014   BUN 12 09/03/2014   NA 142 09/03/2014   K 4.2 09/03/2014   CL 108 09/03/2014   CO2 27 09/03/2014    Lab Results  Component Value Date   HGBA1C 6.2* 09/07/2013   Lipid Panel     Component Value Date/Time   CHOL 169 09/03/2014 0930   TRIG 87 09/03/2014 0930   HDL 49 09/03/2014 0930   CHOLHDL 3.4 09/03/2014 0930   VLDL 17 09/03/2014 0930   LDLCALC 103* 09/03/2014 0930       Assessment and plan:   Sharonann was seen today for hematuria.  Diagnoses and all orders for this visit:  Essential hypertension -     Basic Metabolic Panel -     losartan (COZAAR) 25 MG tablet; Take 1 tablet (25 mg total) by mouth daily. BP is elevated in office but has been low or normal on previous visits. I will have her come back in 3 weeks for a recheck. DASH diet advised. Weight loss and exercise advised  Blood in urine -     Urinalysis Dipstick I will treat empirically with 3 days of Cipro. If blood persist she may make follow up appointment with Urology.    Right leg swelling I believe swelling is a result of morbid obesity and her osteoarthritis. She may continue Mobic. Weight loss stressed with patient. Return precautions addressed.  Morbid obesity, unspecified obesity type ([place]) Weight loss discussed at length and its complications to  health.  Patient will loss 10 pounts by next visit in 3 months.  Diet and exercise discussed as well as calorie intake. Water aerobics stressed to help with joint pain.  Healthcare maintenance -     HIV antibody (with reflex) -     Hepatitis panel, acute   Return in about 1  year (around 04/13/2016) for Nurse Visit-bp check and 3 mo pcp.       Lance Bosch, [facility] [phone] 04/14/2015, 10:13 AM

## 2015-04-14 NOTE — Progress Notes (Signed)
Patient states she has been having blood in her urine that started this past Friday Patient stated she has not has a period in three years so this out of the ordinary for her Patient did state she saw a urologist a few months back and everything was fine Denies any pain when voiding

## 2015-04-15 LAB — HIV ANTIBODY (ROUTINE TESTING W REFLEX): HIV: NONREACTIVE

## 2015-04-15 LAB — HEPATITIS PANEL, ACUTE
HCV Ab: NEGATIVE
Hep A IgM: NONREACTIVE
Hep B C IgM: NONREACTIVE
Hepatitis B Surface Ag: NEGATIVE

## 2015-04-18 ENCOUNTER — Telehealth: Payer: Self-pay

## 2015-04-18 NOTE — Telephone Encounter (Signed)
-----   Message from Lance Bosch, NP sent at 04/15/2015  1:42 PM EST ----- Labs are within normal limits normal

## 2015-04-18 NOTE — Telephone Encounter (Signed)
Spoke with patient this am  Patient is aware of her normal lab results

## 2015-04-29 ENCOUNTER — Encounter: Payer: Medicaid Other | Admitting: Pharmacist

## 2015-05-30 ENCOUNTER — Telehealth: Payer: Self-pay | Admitting: Internal Medicine

## 2015-05-30 ENCOUNTER — Other Ambulatory Visit: Payer: Self-pay | Admitting: Internal Medicine

## 2015-05-30 ENCOUNTER — Telehealth: Payer: Self-pay

## 2015-05-30 MED ORDER — FLUTICASONE PROPIONATE HFA 110 MCG/ACT IN AERO: 2.0000 | INHALATION_SPRAY | Freq: Two times a day (BID) | RESPIRATORY_TRACT | Status: DC

## 2015-05-30 NOTE — Telephone Encounter (Signed)
Returned patient phone call Patient is aware a more affordable RX for an inhaler has  [facility]

## 2015-05-30 NOTE — Telephone Encounter (Signed)
Pt. Called stating that she has [employer] and the medication of beclomethasone (QVAR) 40 MCG/ACT inhaler, will cost her a lot of money. Pt. Would like to know if it can be changed to something cheaper. Pt. Used to have medicaid. Please f/u.

## 2015-06-03 ENCOUNTER — Other Ambulatory Visit: Payer: Self-pay | Admitting: Internal Medicine

## 2015-06-04 MED FILL — OMEPRAZOLE DR 20 MG CAPSULE: 20 | 30 days supply | Qty: 30 | Fill #0

## 2015-06-06 MED FILL — LOSARTAN POTASSIUM 25 MG TA: 25 | 30 days supply | Qty: 30 | Fill #5

## 2015-06-06 MED FILL — FLOVENT HFA 110 MCG INHALER: 110 | 30 days supply | Qty: 12 | Fill #0

## 2015-06-16 ENCOUNTER — Other Ambulatory Visit: Payer: Self-pay

## 2015-06-16 DIAGNOSIS — I10 Essential (primary) hypertension: Secondary | ICD-10-CM

## 2015-06-16 MED ORDER — LOSARTAN POTASSIUM 25 MG PO TABS: 25.0000 mg | ORAL_TABLET | Freq: Every day | ORAL | Status: DC

## 2015-07-09 MED FILL — LOSARTAN POTASSIUM 25 MG TA: 25 | 30 days supply | Qty: 30 | Fill #0

## 2015-07-17 ENCOUNTER — Encounter: Payer: Self-pay | Admitting: Internal Medicine

## 2015-07-17 ENCOUNTER — Ambulatory Visit: Payer: BLUE CROSS/BLUE SHIELD | Attending: Internal Medicine | Admitting: Internal Medicine

## 2015-07-17 VITALS — BP 146/80 | HR 89 | Temp 98.0°F | Resp 16 | Ht 63.0 in | Wt 301.6 lb

## 2015-07-17 DIAGNOSIS — K219 Gastro-esophageal reflux disease without esophagitis: Secondary | ICD-10-CM | POA: Diagnosis not present

## 2015-07-17 DIAGNOSIS — N939 Abnormal uterine and vaginal bleeding, unspecified: Secondary | ICD-10-CM | POA: Insufficient documentation

## 2015-07-17 DIAGNOSIS — R059 Cough, unspecified: Secondary | ICD-10-CM

## 2015-07-17 DIAGNOSIS — E785 Hyperlipidemia, unspecified: Secondary | ICD-10-CM | POA: Diagnosis not present

## 2015-07-17 DIAGNOSIS — R05 Cough: Secondary | ICD-10-CM | POA: Insufficient documentation

## 2015-07-17 DIAGNOSIS — I10 Essential (primary) hypertension: Secondary | ICD-10-CM | POA: Diagnosis not present

## 2015-07-17 MED ORDER — LOSARTAN POTASSIUM 25 MG PO TABS: 25.0000 mg | ORAL_TABLET | Freq: Every day | ORAL | Status: DC

## 2015-07-17 MED ORDER — OMEPRAZOLE 20 MG PO CPDR: 20.0000 mg | DELAYED_RELEASE_CAPSULE | Freq: Every day | ORAL | Status: DC

## 2015-07-17 MED ORDER — BENZONATATE 100 MG PO CAPS: 100.0000 mg | ORAL_CAPSULE | Freq: Two times a day (BID) | ORAL | Status: DC | PRN

## 2015-07-17 MED ORDER — PRAVASTATIN SODIUM 40 MG PO TABS: 40.0000 mg | ORAL_TABLET | Freq: Every day | ORAL | Status: DC

## 2015-07-17 MED FILL — PRAVASTATIN NA 40 MG TAB: 40 | 30 days supply | Qty: 30 | Fill #0

## 2015-07-17 MED FILL — OMEPRAZOLE DR 20 MG CAPSULE: 20 | 30 days supply | Qty: 30 | Fill #0

## 2015-07-17 MED FILL — BENZONATATE 100 MG CAPSULE: 100 | 15 days supply | Qty: 30 | Fill #0

## 2015-07-17 NOTE — Progress Notes (Signed)
Patient here for follow up Started not feeling well a week ago Still having some cough congestion and SOB

## 2015-07-17 NOTE — Progress Notes (Signed)
   Subjective:    Patient ID: Leah Hampton, female    DOB: 13-Dec-1955, 60 y.o.   MRN: [id number]  HPI Comments: Patient reports that she is continuing to have some bleeding when she wipes. She has been to Urology and cleared for hematuria by cystoscopy. She is now requesting a GYN exam for further evaluation and her pap smear.   Patient would like medication refills.  Cough This is a new problem. The current episode started in the past 7 days. The cough is productive of sputum. Associated symptoms include nasal congestion, shortness of breath and wheezing. Pertinent negatives include no chills, fever, postnasal drip, rhinorrhea (resolved) or sore throat (resolved). Associated symptoms comments: Has heartburns after every meal, has not taken Prilosec in several weeks. Nothing aggravates the symptoms. Treatments tried: Theraflu and alka-seltzer. Her past medical history is significant for bronchitis.    Review of Systems  Constitutional: Negative for fever and chills.  HENT: Negative for postnasal drip, rhinorrhea (resolved) and sore throat (resolved).   Respiratory: Positive for cough, shortness of breath and wheezing.        Objective:   Physical Exam  Constitutional: She is oriented to person, place, and time.  HENT:  Right Ear: External ear normal.  Left Ear: External ear normal.  Mouth/Throat: Oropharynx is clear and moist.  Cardiovascular: Normal rate, regular rhythm and normal heart sounds.   Pulmonary/Chest: Effort normal and breath sounds normal. She has no wheezes.  Musculoskeletal: She exhibits no tenderness.  Neurological: She is alert and oriented to person, place, and time.  Skin: Skin is warm and dry.  Psychiatric: She has a normal mood and affect.      Assessment & Plan:  Leah Hampton was seen today for cough.  Diagnoses and all orders for this visit:  Cough -     Begin benzonatate (TESSALON) 100 MG capsule; Take 1 capsule (100 mg total) by mouth 2 (two) times  daily as needed for cough.  Vaginal bleeding -     Ambulatory referral to Gynecology Patient has been evaluated for hematuria and has been cleared by Urology.   Essential hypertension -     losartan (COZAAR) 25 MG tablet; Take 1 tablet (25 mg total) by mouth daily. Patient blood pressure is stable and may continue on current medication.  Education on diet, exercise, and modifiable risk factors discussed. Will obtain appropriate labs as needed. Will follow up in 3-6 months.   HLD (hyperlipidemia) -     pravastatin (PRAVACHOL) 40 MG tablet; Take 1 tablet (40 mg total) by mouth daily. Stable meds refill  Gastroesophageal reflux disease, esophagitis presence not specified -     omeprazole (PRILOSEC) 20 MG capsule; Take 1 capsule (20 mg total) by mouth daily. Discussed diet and weight with patient relating to acid reflux.  Went over things that may exacerbate acid reflux such as tomatoes, spicy foods, coffee, carbonated beverages, chocolates, etc.  Advised patient to avoid laying down at least two hours after meals and sleep with HOB elevated.    Return in about 3 months (around 10/17/2015) for Hypertension.  Lance Bosch, NP 07/18/2015 7:18 AM

## 2015-07-29 MED FILL — FLOVENT HFA 110 MCG INHALER: 110 | 30 days supply | Qty: 12 | Fill #1

## 2015-08-06 MED FILL — LOSARTAN POTASSIUM 25 MG TA: 25 | 90 days supply | Qty: 90 | Fill #0

## 2015-08-15 LAB — GLUCOSE, POCT (MANUAL RESULT ENTRY): POC Glucose: 98 mg/dl (ref 70–99)

## 2015-09-02 MED FILL — OMEPRAZOLE DR 20 MG CAPSULE: 20 | 30 days supply | Qty: 30 | Fill #1

## 2015-09-02 MED FILL — PRAVASTATIN NA 40 MG TAB: 40 | 30 days supply | Qty: 30 | Fill #1

## 2015-09-02 MED FILL — FLOVENT HFA 110 MCG INHALER: 110 | 30 days supply | Qty: 12 | Fill #2

## 2015-09-18 ENCOUNTER — Other Ambulatory Visit: Payer: Self-pay

## 2015-09-18 DIAGNOSIS — Z1231 Encounter for screening mammogram for malignant neoplasm of breast: Secondary | ICD-10-CM

## 2015-10-01 MED FILL — PRAVASTATIN NA 40 MG TAB: 40 | 30 days supply | Qty: 30 | Fill #2

## 2015-10-01 MED FILL — OMEPRAZOLE DR 20 MG CAPSULE: 20 | 30 days supply | Qty: 30 | Fill #2

## 2015-10-01 MED FILL — FLOVENT HFA 110 MCG INHALER: 110 | 30 days supply | Qty: 12 | Fill #3

## 2015-10-15 ENCOUNTER — Other Ambulatory Visit: Payer: Self-pay

## 2015-10-15 ENCOUNTER — Ambulatory Visit
Admission: RE | Admit: 2015-10-15 | Discharge: 2015-10-15 | Disposition: A | Discharge status: Home / Self Care | Payer: BLUE CROSS/BLUE SHIELD | Source: Ambulatory Visit

## 2015-10-15 DIAGNOSIS — Z1231 Encounter for screening mammogram for malignant neoplasm of breast: Secondary | ICD-10-CM

## 2015-11-04 ENCOUNTER — Other Ambulatory Visit: Payer: Self-pay | Admitting: Internal Medicine

## 2015-11-04 MED FILL — OMEPRAZOLE DR 20 MG CAPSULE: 20 | 30 days supply | Qty: 30 | Fill #3

## 2015-11-04 MED FILL — FLOVENT HFA 110 MCG INHALER: 110 | 20 days supply | Qty: 12 | Fill #0

## 2015-11-04 MED FILL — PRAVASTATIN NA 40 MG TAB: 40 | 30 days supply | Qty: 30 | Fill #3

## 2015-11-04 MED FILL — LOSARTAN POTASSIUM 25 MG TA: 25 | 90 days supply | Qty: 90 | Fill #1

## 2015-11-17 ENCOUNTER — Other Ambulatory Visit: Payer: Self-pay | Admitting: Obstetrics and Gynecology

## 2015-11-17 ENCOUNTER — Other Ambulatory Visit (HOSPITAL_COMMUNITY)
Admission: RE | Admit: 2015-11-17 | Discharge: 2015-11-17 | Disposition: A | Discharge status: Home / Self Care | Payer: BLUE CROSS/BLUE SHIELD | Source: Ambulatory Visit | Attending: Obstetrics and Gynecology | Admitting: Obstetrics and Gynecology

## 2015-11-17 DIAGNOSIS — Z01419 Encounter for gynecological examination (general) (routine) without abnormal findings: Secondary | ICD-10-CM | POA: Insufficient documentation

## 2015-11-17 DIAGNOSIS — Z1151 Encounter for screening for human papillomavirus (HPV): Secondary | ICD-10-CM | POA: Insufficient documentation

## 2015-11-19 LAB — CYTOLOGY - PAP

## 2015-11-24 MED FILL — FLUCONAZOLE 150 MG TABLET: 150 | 1 days supply | Qty: 1 | Fill #0

## 2015-11-25 MED FILL — MEDROXYPROGESTERONE 10 MG T: 10 | 10 days supply | Qty: 10 | Fill #0

## 2015-12-15 MED FILL — MEDROXYPROGESTERONE 10 MG T: 10 | 30 days supply | Qty: 30 | Fill #0

## 2015-12-16 MED FILL — PRAVASTATIN NA 40 MG TAB: 40 | 30 days supply | Qty: 30 | Fill #4

## 2016-02-03 MED FILL — PRAVASTATIN NA 40 MG TAB: 40 | 30 days supply | Qty: 30 | Fill #5

## 2016-02-03 MED FILL — FLOVENT HFA 110 MCG INHALER: 110 | 30 days supply | Qty: 12 | Fill #1

## 2016-02-03 MED FILL — LOSARTAN POTASSIUM 25 MG TA: 25 | 90 days supply | Qty: 90 | Fill #2

## 2016-02-03 MED FILL — OMEPRAZOLE DR 20 MG CAPSULE: 20 | 30 days supply | Qty: 30 | Fill #1

## 2016-03-03 MED FILL — PRAVASTATIN NA 40 MG TAB: 40 | 30 days supply | Qty: 30 | Fill #1

## 2016-03-22 ENCOUNTER — Ambulatory Visit: Payer: BLUE CROSS/BLUE SHIELD | Attending: Family Medicine | Admitting: Family Medicine

## 2016-03-22 ENCOUNTER — Encounter: Payer: Self-pay | Admitting: Family Medicine

## 2016-03-22 VITALS — BP 136/77 | HR 88 | Temp 97.6°F | Ht 63.0 in | Wt 302.4 lb

## 2016-03-22 DIAGNOSIS — M1711 Unilateral primary osteoarthritis, right knee: Secondary | ICD-10-CM | POA: Insufficient documentation

## 2016-03-22 DIAGNOSIS — R05 Cough: Secondary | ICD-10-CM | POA: Diagnosis not present

## 2016-03-22 DIAGNOSIS — I10 Essential (primary) hypertension: Secondary | ICD-10-CM

## 2016-03-22 DIAGNOSIS — Z23 Encounter for immunization: Secondary | ICD-10-CM | POA: Insufficient documentation

## 2016-03-22 DIAGNOSIS — G8929 Other chronic pain: Secondary | ICD-10-CM

## 2016-03-22 DIAGNOSIS — E782 Mixed hyperlipidemia: Secondary | ICD-10-CM | POA: Insufficient documentation

## 2016-03-22 DIAGNOSIS — M545 Low back pain, unspecified: Secondary | ICD-10-CM

## 2016-03-22 DIAGNOSIS — K219 Gastro-esophageal reflux disease without esophagitis: Secondary | ICD-10-CM | POA: Insufficient documentation

## 2016-03-22 DIAGNOSIS — R053 Chronic cough: Secondary | ICD-10-CM

## 2016-03-22 MED ORDER — PRAVASTATIN SODIUM 40 MG PO TABS: 40.0000 mg | ORAL_TABLET | Freq: Every day | ORAL | 5 refills | Status: DC

## 2016-03-22 MED ORDER — FLUTICASONE PROPIONATE HFA 110 MCG/ACT IN AERO: 2.0000 | INHALATION_SPRAY | Freq: Two times a day (BID) | RESPIRATORY_TRACT | 5 refills | Status: DC

## 2016-03-22 MED ORDER — LOSARTAN POTASSIUM 25 MG PO TABS: 25.0000 mg | ORAL_TABLET | Freq: Every day | ORAL | 5 refills | Status: DC

## 2016-03-22 MED ORDER — OMEPRAZOLE 20 MG PO CPDR: 20.0000 mg | DELAYED_RELEASE_CAPSULE | Freq: Every day | ORAL | 5 refills | Status: DC

## 2016-03-22 MED ORDER — ALBUTEROL SULFATE HFA 108 (90 BASE) MCG/ACT IN AERS: 2.0000 | INHALATION_SPRAY | Freq: Four times a day (QID) | RESPIRATORY_TRACT | 5 refills | Status: DC | PRN

## 2016-03-22 MED ORDER — IBUPROFEN 800 MG PO TABS: 800.0000 mg | ORAL_TABLET | Freq: Three times a day (TID) | ORAL | 2 refills | Status: DC | PRN

## 2016-03-22 MED FILL — IBUPROFEN 800 MG TABLET: 800 | 10 days supply | Qty: 30 | Fill #0

## 2016-03-22 MED FILL — VENTOLIN HFA 90 MCG INHALER: 108 (90 BAS | 20 days supply | Qty: 18 | Fill #0

## 2016-03-22 MED FILL — OMEPRAZOLE DR 20 MG CAPSULE: 20 | 30 days supply | Qty: 30 | Fill #0 | Status: TO

## 2016-03-22 MED FILL — FLOVENT HFA 110 MCG INHALER: 110 | 30 days supply | Qty: 12 | Fill #0 | Status: TO

## 2016-03-22 NOTE — Progress Notes (Signed)
Subjective:  Patient ID: Leah Hampton, female    DOB: [birth year]  Age: 60 y.o. MRN: [id number]  CC: Hypertension   HPI Leah Hampton presents for    1. CHRONIC HYPERTENSION  Disease Monitoring  Blood pressure range: not checking   Chest pain: no   Dyspnea: no   Claudication: no   Medication compliance: yes  Medication Side Effects  Lightheadedness: no   Urinary frequency: no   Edema: no   Impotence:    Preventitive Healthcare:  Exercise: no   Diet Pattern:   Salt Restriction: no  2. Chronic pain: in low back and knees. Treated in past by sports medicine and orthopaedic. Has been advised to lose 40# to reduce pain.  No joint swelling. Taking ibuprofen for pain control.   3. Chronic cough: she is a chronic non smoker. Has been exposed to second-hand some. Taking flovent for cough. Reports that she was diagnosed with bronchitis.   Past Surgical History:  Procedure Laterality Date  . COLONOSCOPY WITH PROPOFOL N/A 03/27/2015   Procedure: COLONOSCOPY WITH PROPOFOL;  Surgeon: Milus Banister, MD;  Location: [facility];  Service: Endoscopy;  Laterality: N/A;  . KNEE ARTHROSCOPY  04/2007   left knee  . TOTAL KNEE ARTHROPLASTY  10/2008   Dr Roxine Caddy   Social History  Substance Use Topics  . Smoking status: Never Smoker  . Smokeless tobacco: Never Used  . Alcohol use No    Outpatient Medications Prior to Visit  Medication Sig Dispense Refill  . albuterol (PROVENTIL HFA;VENTOLIN HFA) 108 (90 BASE) MCG/ACT inhaler Inhale 2 puffs into the lungs every 6 (six) hours as needed for wheezing or shortness of breath. 3 Inhaler 3  . FLOVENT HFA 110 MCG/ACT inhaler INHALE 2 PUFFS INTO THE LUNGS 2 TIMES DAILY. 12 g 3  . ibuprofen (ADVIL,MOTRIN) 600 MG tablet Take 1 tablet (600 mg total) by mouth every 8 (eight) hours as needed. (Patient taking differently: Take 600 mg by mouth every 8 (eight) hours as needed for mild pain. ) 45 tablet 0  . losartan (COZAAR) 25 MG tablet Take 1  tablet (25 mg total) by mouth daily. 90 tablet 2  . omeprazole (PRILOSEC) 20 MG capsule Take 1 capsule (20 mg total) by mouth daily. 30 capsule 2  . pravastatin (PRAVACHOL) 40 MG tablet Take 1 tablet (40 mg total) by mouth daily. 90 tablet 1  . benzonatate (TESSALON) 100 MG capsule Take 1 capsule (100 mg total) by mouth 2 (two) times daily as needed for cough. (Patient not taking: Reported on 03/22/2016) 30 capsule 0  . chlorhexidine (PERIDEX) 0.12 % solution 15 mLs by Mouth Rinse route 2 (two) times daily as needed. Reported on 07/17/2015  0  . ciprofloxacin (CIPRO) 500 MG tablet Take 1 tablet (500 mg total) by mouth 2 (two) times daily. (Patient not taking: Reported on 03/22/2016) 6 tablet 0  . HYDROcodone-acetaminophen (NORCO/VICODIN) 5-325 MG per tablet Take 1 tablet by mouth every 6 (six) hours as needed for moderate pain.    . meloxicam (MOBIC) 15 MG tablet Take 15 mg by mouth daily.    . Na Sulfate-K Sulfate-Mg Sulf SOLN Take 1 kit by mouth once. (Patient not taking: Reported on 03/22/2016) 354 mL 0  . SUPREP BOWEL PREP SOLN Take 1 kit by mouth once. (Patient not taking: Reported on 03/22/2016) 354 mL 0  . traMADol (ULTRAM) 50 MG tablet Take 50 mg by mouth every 6 (six) hours as needed for moderate pain.  No facility-administered medications prior to visit.     ROS Review of Systems  Constitutional: Negative for chills and fever.  Eyes: Negative for visual disturbance.  Respiratory: Positive for cough. Negative for shortness of breath.   Cardiovascular: Negative for chest pain.  Gastrointestinal: Negative for abdominal pain and blood in stool.  Musculoskeletal: Positive for arthralgias (knees ) and back pain (in L side ).  Skin: Negative for rash.  Allergic/Immunologic: Negative for immunocompromised state.  Hematological: Negative for adenopathy. Does not bruise/bleed easily.  Psychiatric/Behavioral: Negative for dysphoric mood and suicidal ideas.    Objective:  BP 136/77 (BP  Location: Right Arm, Patient Position: Sitting, Cuff Size: Large)   Pulse 88   Temp 97.6 F (36.4 C) (Oral)   Ht 5' 3"  (1.6 m)   Wt (!) 302 lb 6.4 oz (137.2 kg)   LMP 11/10/2011   SpO2 97%   BMI 53.57 kg/m   BP/Weight 03/22/2016 8/46/6599 07/20/7015  Systolic BP 793 903 009  Diastolic BP 77 90 80  Wt. (Lbs) 302.4 - 301.6  BMI 53.57 - 53.44    Physical Exam  Constitutional: She is oriented to person, place, and time. She appears well-developed and well-nourished. No distress.  Obese   HENT:  Head: Normocephalic and atraumatic.  Cardiovascular: Normal rate, regular rhythm, normal heart sounds and intact distal pulses.   Pulmonary/Chest: Effort normal and breath sounds normal.  Musculoskeletal: She exhibits no edema.  Neurological: She is alert and oriented to person, place, and time.  Skin: Skin is warm and dry. No rash noted.  Psychiatric: She has a normal mood and affect.     Assessment & Plan:  Leah Hampton was seen today for hypertension.  Diagnoses and all orders for this visit:  Essential hypertension -     Cancel: BASIC METABOLIC PANEL WITH GFR -     COMPLETE METABOLIC PANEL WITH GFR -     losartan (COZAAR) 25 MG tablet; Take 1 tablet (25 mg total) by mouth daily.  Chronic cough -     fluticasone (FLOVENT HFA) 110 MCG/ACT inhaler; Inhale 2 puffs into the lungs 2 (two) times daily. -     albuterol (PROVENTIL HFA;VENTOLIN HFA) 108 (90 Base) MCG/ACT inhaler; Inhale 2 puffs into the lungs every 6 (six) hours as needed for wheezing or shortness of breath. -     Pulmonary function test; Future  Mixed hyperlipidemia -     Lipid Panel -     pravastatin (PRAVACHOL) 40 MG tablet; Take 1 tablet (40 mg total) by mouth daily.  Gastroesophageal reflux disease, esophagitis presence not specified -     omeprazole (PRILOSEC) 20 MG capsule; Take 1 capsule (20 mg total) by mouth daily.  Chronic left-sided low back pain without sciatica -     ibuprofen (ADVIL,MOTRIN) 800 MG tablet;  Take 1 tablet (800 mg total) by mouth every 8 (eight) hours as needed.  Primary osteoarthritis of right knee -     ibuprofen (ADVIL,MOTRIN) 800 MG tablet; Take 1 tablet (800 mg total) by mouth every 8 (eight) hours as needed.  Flu vaccine need -     Flu Vaccine QUAD 36+ mos IM   There are no diagnoses linked to this encounter.  No orders of the defined types were placed in this encounter.   Follow-up: Return in about 3 months (around 06/22/2016) for HTN .   Boykin Nearing MD

## 2016-03-22 NOTE — Assessment & Plan Note (Signed)
Well controlled HTN Continue current regimen

## 2016-03-22 NOTE — Patient Instructions (Addendum)
Leah Hampton was seen today for hypertension.  Diagnoses and all orders for this visit:  Essential hypertension -     Cancel: BASIC METABOLIC PANEL WITH GFR -     COMPLETE METABOLIC PANEL WITH GFR -     losartan (COZAAR) 25 MG tablet; Take 1 tablet (25 mg total) by mouth daily.  Chronic cough -     fluticasone (FLOVENT HFA) 110 MCG/ACT inhaler; Inhale 2 puffs into the lungs 2 (two) times daily. -     albuterol (PROVENTIL HFA;VENTOLIN HFA) 108 (90 Base) MCG/ACT inhaler; Inhale 2 puffs into the lungs every 6 (six) hours as needed for wheezing or shortness of breath. -     Pulmonary function test; Future  Mixed hyperlipidemia -     Lipid Panel -     pravastatin (PRAVACHOL) 40 MG tablet; Take 1 tablet (40 mg total) by mouth daily.  Gastroesophageal reflux disease, esophagitis presence not specified -     omeprazole (PRILOSEC) 20 MG capsule; Take 1 capsule (20 mg total) by mouth daily.  Chronic left-sided low back pain without sciatica -     ibuprofen (ADVIL,MOTRIN) 800 MG tablet; Take 1 tablet (800 mg total) by mouth every 8 (eight) hours as needed.  Primary osteoarthritis of right knee -     ibuprofen (ADVIL,MOTRIN) 800 MG tablet; Take 1 tablet (800 mg total) by mouth every 8 (eight) hours as needed.  Other orders -     Flu Vaccine QUAD 36+ mos IM  For weight loss  Cut out sugar sweetened drinks Reduce carb intake F/u in 3 months for HTN  Dr. Adrian Blackwater

## 2016-03-22 NOTE — Assessment & Plan Note (Signed)
Still with chronic intermittent cough despite change from lisinopril to losartan On Flovent  Plan: PFTs Refilled Flovent Refilled albuterol for prn use

## 2016-03-22 NOTE — Progress Notes (Signed)
Pt is getting flu shot today. 

## 2016-03-22 NOTE — Assessment & Plan Note (Signed)
Chronic pain Plan: NSAID Weight loss with reduced sugar diet

## 2016-03-23 LAB — COMPLETE METABOLIC PANEL WITH GFR
ALK PHOS: 101 U/L (ref 33–130)
ALT: 15 U/L (ref 6–29)
AST: 20 U/L (ref 10–35)
Albumin: 4 g/dL (ref 3.6–5.1)
BUN: 12 mg/dL (ref 7–25)
CHLORIDE: 108 mmol/L (ref 98–110)
CO2: 29 mmol/L (ref 20–31)
CREATININE: 0.87 mg/dL (ref 0.50–1.05)
Calcium: 9.1 mg/dL (ref 8.6–10.4)
GFR, EST NON AFRICAN AMERICAN: 74 mL/min (ref >=60)
GFR, Est African American: 85 mL/min (ref >=60)
GLUCOSE: 107 mg/dL — AB (ref 65–99)
Potassium: 4 mmol/L (ref 3.5–5.3)
SODIUM: 143 mmol/L (ref 135–146)
TOTAL PROTEIN: 7.1 g/dL (ref 6.1–8.1)
Total Bilirubin: 0.2 mg/dL (ref 0.2–1.2)

## 2016-03-23 LAB — LIPID PANEL
CHOL/HDL RATIO: 3.5 ratio (ref <5.0)
Cholesterol: 170 mg/dL (ref <200)
HDL: 48 mg/dL — ABNORMAL LOW (ref >50)
LDL CALC: 105 mg/dL — AB
TRIGLYCERIDES: 87 mg/dL (ref <150)
VLDL: 17 mg/dL (ref <30)

## 2016-03-24 ENCOUNTER — Telehealth: Payer: Self-pay

## 2016-03-24 NOTE — Telephone Encounter (Signed)
Pt was called and informed of lab results. 

## 2016-03-29 MED FILL — PRAVASTATIN NA 40 MG TAB: 40 | 30 days supply | Qty: 30 | Fill #0 | Status: TO

## 2016-03-30 ENCOUNTER — Inpatient Hospital Stay (HOSPITAL_COMMUNITY): Admission: RE | Admit: 2016-03-30 | Payer: BLUE CROSS/BLUE SHIELD | Source: Ambulatory Visit

## 2016-04-19 ENCOUNTER — Telehealth: Payer: Self-pay | Admitting: Family Medicine

## 2016-04-19 NOTE — Telephone Encounter (Signed)
Spoke to patient via phone. States she has been going to the bathroom every 2 hours and has had little output. Admits to mild discomfort with voiding. Sx's onset Saturday. Denies odor. Pt was scheduled on nurse  template for Lab: Urinalysis. Appointment with Dr. Adrian Blackwater on 04/27/16. May need to cancel if U/A is positive for UTI and treatment provided during Nurse visit.

## 2016-04-19 NOTE — Telephone Encounter (Signed)
Patient feels she may have a bladder infection, patient says shes experiencing frequent urination and burning sensation while urinating.   Please follow up.

## 2016-04-20 ENCOUNTER — Ambulatory Visit: Payer: Medicare PPO | Attending: Internal Medicine

## 2016-04-20 DIAGNOSIS — R319 Hematuria, unspecified: Secondary | ICD-10-CM

## 2016-04-20 DIAGNOSIS — N390 Urinary tract infection, site not specified: Secondary | ICD-10-CM | POA: Insufficient documentation

## 2016-04-20 LAB — POCT URINALYSIS DIPSTICK
BILIRUBIN UA: NEGATIVE
GLUCOSE UA: NEGATIVE
KETONES UA: NEGATIVE
Nitrite, UA: POSITIVE
Protein, UA: 100
Spec Grav, UA: 1.02
Urobilinogen, UA: 0.2
pH, UA: 5.5

## 2016-04-20 MED ORDER — CIPROFLOXACIN HCL 500 MG PO TABS: 500.0000 mg | ORAL_TABLET | Freq: Two times a day (BID) | ORAL | 0 refills | Status: DC

## 2016-04-20 MED ORDER — CEPHALEXIN 500 MG PO CAPS: 500.0000 mg | ORAL_CAPSULE | Freq: Two times a day (BID) | ORAL | 0 refills | Status: DC

## 2016-04-20 NOTE — Patient Instructions (Signed)
Urinary Tract Infection, Adult Introduction A urinary tract infection (UTI) is an infection of any part of the urinary tract. The urinary tract includes the:  Kidneys.  Ureters.  Bladder.  Urethra. These organs make, store, and get rid of pee (urine) in the body. Follow these instructions at home:  Take over-the-counter and prescription medicines only as told by your doctor.  If you were prescribed an antibiotic medicine, take it as told by your doctor. Do not stop taking the antibiotic even if you start to feel better.  Avoid the following drinks:  Alcohol.  Caffeine.  Tea.  Carbonated drinks.  Drink enough fluid to keep your pee clear or pale yellow.  Keep all follow-up visits as told by your doctor. This is important.  Make sure to:  Empty your bladder often and completely. Do not to hold pee for long periods of time.  Empty your bladder before and after sex.  Wipe from front to back after a bowel movement if you are female. Use each tissue one time when you wipe. Contact a doctor if:  You have back pain.  You have a fever.  You feel sick to your stomach (nauseous).  You throw up (vomit).  Your symptoms do not get better after 3 days.  Your symptoms go away and then come back. Get help right away if:  You have very bad back pain.  You have very bad lower belly (abdominal) pain.  You are throwing up and cannot keep down any medicines or water. This information is not intended to replace advice given to you by your health care provider. Make sure you discuss any questions you have with your health care provider. Document Released: 10/20/2007 Document Revised: 10/09/2015 Document Reviewed: 03/24/2015  2017 Elsevier  

## 2016-04-20 NOTE — Addendum Note (Signed)
Addended by: Carilyn Goodpasture on: 04/20/2016 12:48 PM   Modules accepted: Orders

## 2016-04-20 NOTE — Progress Notes (Addendum)
Pt arrived for nurses visit for UTI sx's: urinary frequency and pain  Obtained urine per standing orders Urinalysis positive for UTI. Consulted with Dr. Adrian Blackwater. Cephalexin prescribed. Urine sent for culture per protocol. Results given to patient and informed that medication available to pick up at pharmacy of choice. Pt left before receiving AVS. Will mail AVS. Carilyn Goodpasture, BSN, RN

## 2016-04-22 LAB — URINE CULTURE

## 2016-04-27 ENCOUNTER — Ambulatory Visit: Payer: BLUE CROSS/BLUE SHIELD | Admitting: Family Medicine

## 2016-04-30 ENCOUNTER — Telehealth: Payer: Self-pay | Admitting: *Deleted

## 2016-04-30 DIAGNOSIS — R05 Cough: Secondary | ICD-10-CM

## 2016-04-30 DIAGNOSIS — R053 Chronic cough: Secondary | ICD-10-CM

## 2016-04-30 MED ORDER — ALBUTEROL SULFATE HFA 108 (90 BASE) MCG/ACT IN AERS: 2.0000 | INHALATION_SPRAY | Freq: Four times a day (QID) | RESPIRATORY_TRACT | 5 refills | Status: DC | PRN

## 2016-04-30 NOTE — Telephone Encounter (Signed)
Pt asked if there was a less expensive inhaler that the one she is using currently.  She has to pay 40.00 for inhaler and 75.00 monthly with additional medications.  Please advise. Carilyn Goodpasture, RN

## 2016-04-30 NOTE — Telephone Encounter (Signed)
Patient advised to have inhaler filled at onsite pharmacy Albuterol refilled to onsite pharmacy

## 2016-05-04 ENCOUNTER — Telehealth: Payer: Self-pay | Admitting: *Deleted

## 2016-05-04 MED ORDER — FLUCONAZOLE 150 MG PO TABS: 150.0000 mg | ORAL_TABLET | ORAL | 0 refills | Status: DC

## 2016-05-04 NOTE — Telephone Encounter (Signed)
Pt feels better but asked for medication to help with itching after taking course of ATB for UTI.

## 2016-05-04 NOTE — Telephone Encounter (Signed)
Patient verified DOB Patient is aware of BV being present. Patient completed the antibiotics this past Sunday. Patient is aware of diflucan being ordered for itching concern. Patient denies any other concerns at this time. No further questions.

## 2016-05-04 NOTE — Telephone Encounter (Signed)
Sent in diflucan

## 2016-05-04 NOTE — Telephone Encounter (Signed)
-----   Message from Tresa Garter, MD sent at 04/29/2016  9:03 AM EST ----- Please inform patient that her urine culture is positive for bacterial infection which has been treated with keflex. If symptome persists or reoccur after 14 days from start of treatment please come back to clinic for another testing and/or treatment.

## 2016-05-16 ENCOUNTER — Other Ambulatory Visit: Payer: Self-pay | Admitting: Family Medicine

## 2016-05-19 ENCOUNTER — Telehealth: Payer: Self-pay | Admitting: Family Medicine

## 2016-05-19 NOTE — Telephone Encounter (Signed)
Patient is requesting per insurance to get all medications sent to [place] ( phone: [phone]).  Please follow up with patient

## 2016-05-27 ENCOUNTER — Telehealth: Payer: Self-pay | Admitting: Family Medicine

## 2016-05-27 DIAGNOSIS — I10 Essential (primary) hypertension: Secondary | ICD-10-CM

## 2016-05-27 MED ORDER — LOSARTAN POTASSIUM 25 MG PO TABS: 25.0000 mg | ORAL_TABLET | Freq: Every day | ORAL | 0 refills | Status: DC

## 2016-05-27 NOTE — Telephone Encounter (Signed)
Patient states she hasn't had her losartan since last week. She gets her medications from [place] and it can take 7 to 10 days to get her medicine.  She wanted to ask if we have any samples she can have.  Please follow up

## 2016-05-27 NOTE — Telephone Encounter (Signed)
14 day supply sent to pharmacy to cover. We do not have any samples.

## 2016-06-15 ENCOUNTER — Ambulatory Visit: Payer: Medicare HMO | Attending: Physician Assistant | Admitting: Physician Assistant

## 2016-06-15 ENCOUNTER — Encounter: Payer: Self-pay | Admitting: Physician Assistant

## 2016-06-15 VITALS — BP 139/86 | HR 71 | Temp 97.8°F | Ht 64.0 in | Wt 303.4 lb

## 2016-06-15 DIAGNOSIS — L03011 Cellulitis of right finger: Secondary | ICD-10-CM | POA: Diagnosis not present

## 2016-06-15 MED ORDER — CEPHALEXIN 500 MG PO CAPS: 500.0000 mg | ORAL_CAPSULE | Freq: Three times a day (TID) | ORAL | 0 refills | Status: AC

## 2016-06-15 MED ORDER — LIDOCAINE HCL (PF) 1 % IJ SOLN: 2.0000 mL | Freq: Once | INTRAMUSCULAR | Status: DC

## 2016-06-15 MED ORDER — ACETAMINOPHEN 500 MG PO TABS: 500.0000 mg | ORAL_TABLET | Freq: Four times a day (QID) | ORAL | 0 refills | Status: DC | PRN

## 2016-06-15 NOTE — Patient Instructions (Signed)
Wound Care, Adult Taking care of your wound properly can help to prevent pain and infection. It can also help your wound to heal more quickly. How is this treated? Wound care Follow instructions from your health care provider about how to take care of your wound. Make sure you: Wash your hands with soap and water before you change the bandage (dressing). If soap and water are not available, use hand sanitizer. Change your dressing as told by your health care provider. Leave stitches (sutures), skin glue, or adhesive strips in place. These skin closures may need to stay in place for 2 weeks or longer. If adhesive strip edges start to loosen and curl up, you may trim the loose edges. Do not remove adhesive strips completely unless your health care provider tells you to do that. Check your wound area every day for signs of infection. Check for: More redness, swelling, or pain. More fluid or blood. Warmth. Pus or a bad smell. Ask your health care provider if you should clean the wound with mild soap and water. Doing this may include: Using a clean towel to pat the wound dry after cleaning it. Do not rub or scrub the wound. Applying a cream or ointment. Do this only as told by your health care provider. Covering the incision with a clean dressing. Ask your health care provider when you can leave the wound uncovered. Medicines  If you were prescribed an antibiotic medicine, cream, or ointment, take or use the antibiotic as told by your health care provider. Do not stop taking or using the antibiotic even if your condition improves. Take over-the-counter and prescription medicines only as told by your health care provider. If you were prescribed pain medicine, take it at least 30 minutes before doing any wound care or as told by your health care provider. General instructions Return to your normal activities as told by your health care provider. Ask your health care provider what activities are  safe. Do not scratch or pick at the wound. Keep all follow-up visits as told by your health care provider. This is important. Eat a diet that includes protein, vitamin A, vitamin C, and other nutrient-rich foods. These help the wound heal: Protein-rich foods include meat, dairy, beans, nuts, and other sources. Vitamin A-rich foods include carrots and dark green, leafy vegetables. Vitamin C-rich foods include citrus, tomatoes, and other fruits and vegetables. Nutrient-rich foods have protein, carbohydrates, fat, vitamins, or minerals. Eat a variety of healthy foods including vegetables, fruits, and whole grains. Contact a health care provider if: You received a tetanus shot and you have swelling, severe pain, redness, or bleeding at the injection site. Your pain is not controlled with medicine. You have more redness, swelling, or pain around the wound. You have more fluid or blood coming from the wound. Your wound feels warm to the touch. You have pus or a bad smell coming from the wound. You have a fever or chills. You are nauseous or you vomit. You are dizzy. Get help right away if: You have a red streak going away from your wound. The edges of the wound open up and separate. Your wound is bleeding and the bleeding does not stop with gentle pressure. You have a rash. You faint. You have trouble breathing. This information is not intended to replace advice given to you by your health care provider. Make sure you discuss any questions you have with your health care provider. Document Released: 02/10/2008 Document Revised: 12/31/2015 Document Reviewed: 11/18/2015  Elsevier Interactive Patient Education  2017 [place] Introduction Paronychia is an infection of the skin that surrounds a nail. It usually affects the skin around a fingernail, but it may also occur near a toenail. It often causes pain and swelling around the nail. This condition may come on suddenly or develop  over a longer period. In some cases, a collection of pus (abscess) can form near or under the nail. Usually, paronychia is not serious and it clears up with treatment. What are the causes? This condition may be caused by bacteria or fungi. It is commonly caused by either Streptococcus or Staphylococcus bacteria. The bacteria or fungi often cause the infection by getting into the affected area through an opening in the skin, such as a cut or a hangnail. What increases the risk? This condition is more likely to develop in:  People who get their hands wet often, such as those who work as Designer, industrial/product, bartenders, or nurses.  People who bite their fingernails or suck their thumbs.  People who trim their nails too short.  People who have hangnails or injured fingertips.  People who get manicures.  People who have diabetes. What are the signs or symptoms? Symptoms of this condition include:  Redness and swelling of the skin near the nail.  Tenderness around the nail when you touch the area.  Pus-filled bumps under the cuticle. The cuticle is the skin at the base or sides of the nail.  Fluid or pus under the nail.  Throbbing pain in the area. How is this diagnosed? This condition is usually diagnosed with a physical exam. In some cases, a sample of pus may be taken from an abscess to be tested in a lab. This can help to determine what type of bacteria or fungi is causing the condition. How is this treated? Treatment for this condition depends on the cause and severity of the condition. If the condition is mild, it may clear up on its own in a few days. Your health care provider may recommend soaking the affected area in warm water a few times a day. When treatment is needed, the options may include:  Antibiotic medicine, if the condition is caused by a bacterial infection.  Antifungal medicine, if the condition is caused by a fungal infection.  Incision and drainage, if an abscess is  present. In this procedure, the health care provider will cut open the abscess so the pus can drain out. Follow these instructions at home:  Soak the affected area in warm water if directed to do so by your health care provider. You may be told to do this for 20 minutes, 2-3 times a day. Keep the area dry in between soakings.  Take medicines only as directed by your health care provider.  If you were prescribed an antibiotic medicine, finish all of it even if you start to feel better.  Keep the affected area clean.  Do not try to drain a fluid-filled bump yourself.  If you will be washing dishes or performing other tasks that require your hands to get wet, wear rubber gloves. You should also wear gloves if your hands might come in contact with irritating substances, such as cleaners or chemicals.  Follow your health care provider's instructions about:  Wound care.  Bandage (dressing) changes and removal. Contact a health care provider if:  Your symptoms get worse or do not improve with treatment.  You have a fever or chills.  You have redness spreading from  the affected area.  You have continued or increased fluid, blood, or pus coming from the affected area.  Your finger or knuckle becomes swollen or is difficult to move. This information is not intended to replace advice given to you by your health care provider. Make sure you discuss any questions you have with your health care provider. Document Released: 10/27/2000 Document Revised: 10/09/2015 Document Reviewed: 04/10/2014  2017 Elsevier

## 2016-06-15 NOTE — Progress Notes (Signed)
Subjective:  Patient ID: Leah Hampton, female    DOB: July 22, 1955  Age: 61 y.o. MRN: [id number]  CC:  Chief Complaint  Patient presents with  . infected finger    right middle finger    HPI Leah Hampton is a 61 y.o. female with a PMH of   Past Medical History:  Diagnosis Date  . Bronchitis   . Cystitis   . Hematuria    urologice eval, Dr Estill Dooms  . History of chest pain   . Hyperbilirubinemia   . Hyperlipemia   . Hypertelorism   . Hypertension   . Leukocytosis   . Obesity   . Osteoarthritis     that presents with a 5 day history of a growth on the tip of the right middle finger. She was at the [facility] 5 days ago when the manicurist inadvertently nicked the tip of the affected finger. She now has a 0.65mm spherical growth at the tip of the right middle finger. The growth bleeds and is painful to the touch. No other region in the digit is affected. There is no regional swelling, erythema, or pain elsewhere. Denies f/c/n/v, rash, tingling, numbness, weakness, or limited aROM. Has not taken or applied anything for pain.  Outpatient Medications Prior to Visit  Medication Sig Dispense Refill  . albuterol (PROVENTIL HFA;VENTOLIN HFA) 108 (90 Base) MCG/ACT inhaler Inhale 2 puffs into the lungs every 6 (six) hours as needed for wheezing or shortness of breath. 3 Inhaler 5  . fluticasone (FLOVENT HFA) 110 MCG/ACT inhaler Inhale 2 puffs into the lungs 2 (two) times daily. 12 g 5  . ibuprofen (ADVIL,MOTRIN) 800 MG tablet Take 1 tablet (800 mg total) by mouth every 8 (eight) hours as needed. 30 tablet 2  . losartan (COZAAR) 25 MG tablet Take 1 tablet (25 mg total) by mouth daily. 14 tablet 0  . omeprazole (PRILOSEC) 20 MG capsule Take 1 capsule (20 mg total) by mouth daily. 30 capsule 5  . pravastatin (PRAVACHOL) 40 MG tablet Take 1 tablet (40 mg total) by mouth daily. 30 tablet 5  . cephALEXin (KEFLEX) 500 MG capsule Take 1 capsule (500 mg total) by mouth 2 (two) times daily. 10  capsule 0  . fluconazole (DIFLUCAN) 150 MG tablet Take 1 tablet (150 mg total) by mouth every 3 (three) days. 2 tablet 0   No facility-administered medications prior to visit.     ROS Review of Systems  Constitutional: Negative for chills and fever.       NAD, obese, polite  Respiratory: Negative for shortness of breath.   Cardiovascular: Negative for chest pain.  Gastrointestinal: Negative for nausea.  Musculoskeletal: Negative for joint swelling.  Skin: Negative for rash.  Neurological: Negative for weakness, numbness and headaches.    Objective:  BP 139/86 (BP Location: Right Arm, Patient Position: Sitting, Cuff Size: Large)   Pulse 71   Temp 97.8 F (36.6 C) (Oral)   Ht 5\' 4"  (1.626 m)   Wt (!) 303 lb 6.4 oz (137.6 kg)   LMP 11/10/2011   SpO2 98%   BMI 52.08 kg/m   BP/Weight 06/15/2016 04/20/2016 [id number]  Systolic BP [id number]  Diastolic BP 86 80 77  Wt. (Lbs) 303.4 - 302.4  BMI 52.08 - 53.57    Physical Exam  Constitutional: She is oriented to person, place, and time. She appears well-developed and well-nourished. No distress.  Obese body habitus  HENT:  Head: Normocephalic and atraumatic.  Eyes: Conjunctivae are normal.  No scleral icterus.  Cardiovascular: Normal rate, regular rhythm and normal heart sounds.   Pulmonary/Chest: Effort normal and breath sounds normal.  Musculoskeletal: She exhibits tenderness (distal aspect of the right middle finger TTP specifically at the paronychia and it's attachment point). She exhibits no edema or deformity.  Full aROM of the right middle finger. No erythema or streaking.  Neurological: She is alert and oriented to person, place, and time.  Skin: Skin is warm and dry. No rash noted. No erythema.  Psychiatric: She has a normal mood and affect.     Assessment & Plan:   1. Paronychia of right middle finger   Meds ordered this encounter  Medications  . cephALEXin (KEFLEX) 500 MG capsule    Sig: Take 1 capsule (500  mg total) by mouth 3 (three) times daily.    Dispense:  21 capsule    Refill:  0    Order Specific Question:   Supervising Provider    Answer:   Tresa Garter [id number]  . acetaminophen (TYLENOL) 500 MG tablet    Sig: Take 1 tablet (500 mg total) by mouth every 6 (six) hours as needed.    Dispense:  30 tablet    Refill:  0    Order Specific Question:   Supervising Provider    Answer:   Tresa Garter [id number]    Follow-up: Return if symptoms worsen or fail to improve.   Clent Demark PA

## 2016-06-22 ENCOUNTER — Ambulatory Visit: Payer: Medicare HMO | Attending: Family Medicine | Admitting: Family Medicine

## 2016-06-22 ENCOUNTER — Encounter: Payer: Self-pay | Admitting: Family Medicine

## 2016-06-22 VITALS — BP 143/84 | HR 78 | Temp 97.8°F | Ht 64.0 in | Wt 303.8 lb

## 2016-06-22 DIAGNOSIS — R7309 Other abnormal glucose: Secondary | ICD-10-CM | POA: Diagnosis not present

## 2016-06-22 DIAGNOSIS — I10 Essential (primary) hypertension: Secondary | ICD-10-CM | POA: Diagnosis not present

## 2016-06-22 DIAGNOSIS — L03011 Cellulitis of right finger: Secondary | ICD-10-CM

## 2016-06-22 LAB — POCT GLYCOSYLATED HEMOGLOBIN (HGB A1C): Hemoglobin A1C: 6.1

## 2016-06-22 LAB — GLUCOSE, POCT (MANUAL RESULT ENTRY): POC GLUCOSE: 91 mg/dL (ref 70–99)

## 2016-06-22 MED ORDER — NAPROXEN 500 MG PO TABS: 500.0000 mg | ORAL_TABLET | Freq: Two times a day (BID) | ORAL | 0 refills | Status: DC

## 2016-06-22 MED ORDER — LOSARTAN POTASSIUM 50 MG PO TABS: 50.0000 mg | ORAL_TABLET | Freq: Every day | ORAL | 3 refills | Status: DC

## 2016-06-22 NOTE — Assessment & Plan Note (Signed)
A: HTN Med: compliant with losartan 25 mg daily P: Increase to 50 mg daily due to elevated BP

## 2016-06-22 NOTE — Patient Instructions (Addendum)
Leah Hampton was seen today for hypertension.  Diagnoses and all orders for this visit:  Essential hypertension -     losartan (COZAAR) 50 MG tablet; Take 1 tablet (50 mg total) by mouth daily.  Elevated hemoglobin A1c -     POCT glycosylated hemoglobin (Hb A1C) -     Glucose (CBG)  Paronychia of left middle finger -     Discontinue: naproxen (NAPROSYN) 500 MG tablet; Take 1 tablet (500 mg total) by mouth 2 (two) times daily with a meal. -     naproxen (NAPROSYN) 500 MG tablet; Take 1 tablet (500 mg total) by mouth 2 (two) times daily with a meal.   Keep finger tip clean and dry Elevate to prevent swelling Ice to reduce swelling, apply ice pack for 20 minutes, twice daily Take naproxen, sent to [facility] for pain and swelling  Finish keflex   F/u in 2 weeks for finger check and HTN, sooner if needed.   Dr. Adrian Blackwater

## 2016-06-22 NOTE — Progress Notes (Signed)
91

## 2016-06-22 NOTE — Assessment & Plan Note (Signed)
Paronychia of right middle finger  S/p ID  No abscess There is soft tissue swelling  Plan: Add naproxen for pain and inflammation Continue to dress Ice, elevate  Finish keflex

## 2016-06-22 NOTE — Assessment & Plan Note (Signed)
Slightly elevated A1c Improved from three years ago

## 2016-06-22 NOTE — Progress Notes (Signed)
Subjective:  Patient ID: Leah Hampton, female    DOB: Jan 26, 1956  Age: 61 y.o. MRN: [id number]  CC: Hypertension   HPI Leah Hampton presents for    1. CHRONIC HYPERTENSION  Disease Monitoring  Blood pressure range: not checking   Chest pain: no   Dyspnea: no   Claudication: no   Medication compliance: yes  Medication Side Effects  Lightheadedness: no   Urinary frequency: no   Edema: no   Impotence:    Preventitive Healthcare:  Exercise: no   Diet Pattern:   Salt Restriction: no   2. Right middle finger paronychia: started 12 days ago. S/p incision and drainage last week. She is compliant with keflex. She has throbbing pain in finger tip and bleeding. No purulent discharge. She is keeping her finger wrapped, rinsing with hydrogen peroxide and applying antibiotic ointment.   Past Surgical History:  Procedure Laterality Date  . COLONOSCOPY WITH PROPOFOL N/A 03/27/2015   Procedure: COLONOSCOPY WITH PROPOFOL;  Surgeon: Milus Banister, MD;  Location: [facility];  Service: Endoscopy;  Laterality: N/A;  . KNEE ARTHROSCOPY  04/2007   left knee  . TOTAL KNEE ARTHROPLASTY  10/2008   Dr Roxine Caddy   Social History  Substance Use Topics  . Smoking status: Never Smoker  . Smokeless tobacco: Never Used  . Alcohol use No    Outpatient Medications Prior to Visit  Medication Sig Dispense Refill  . albuterol (PROVENTIL HFA;VENTOLIN HFA) 108 (90 Base) MCG/ACT inhaler Inhale 2 puffs into the lungs every 6 (six) hours as needed for wheezing or shortness of breath. 3 Inhaler 5  . cephALEXin (KEFLEX) 500 MG capsule Take 1 capsule (500 mg total) by mouth 3 (three) times daily. 21 capsule 0  . fluticasone (FLOVENT HFA) 110 MCG/ACT inhaler Inhale 2 puffs into the lungs 2 (two) times daily. 12 g 5  . ibuprofen (ADVIL,MOTRIN) 800 MG tablet Take 1 tablet (800 mg total) by mouth every 8 (eight) hours as needed. 30 tablet 2  . losartan (COZAAR) 25 MG tablet Take 1 tablet (25 mg total)  by mouth daily. 14 tablet 0  . omeprazole (PRILOSEC) 20 MG capsule Take 1 capsule (20 mg total) by mouth daily. 30 capsule 5  . pravastatin (PRAVACHOL) 40 MG tablet Take 1 tablet (40 mg total) by mouth daily. 30 tablet 5  . acetaminophen (TYLENOL) 500 MG tablet Take 1 tablet (500 mg total) by mouth every 6 (six) hours as needed. (Patient not taking: Reported on 06/22/2016) 30 tablet 0   Facility-Administered Medications Prior to Visit  Medication Dose Route Frequency Provider Last Rate Last Dose  . lidocaine (PF) (XYLOCAINE) 1 % injection 2 mL  2 mL Other Once Clent Demark, PA        ROS Review of Systems  Constitutional: Negative for chills and fever.  Eyes: Negative for visual disturbance.  Respiratory: Negative for cough and shortness of breath.   Cardiovascular: Negative for chest pain.  Gastrointestinal: Negative for abdominal pain and blood in stool.  Musculoskeletal: Positive for arthralgias (knees ) and back pain (in L side ).  Skin: Positive for wound. Negative for rash.  Allergic/Immunologic: Negative for immunocompromised state.  Hematological: Negative for adenopathy. Does not bruise/bleed easily.  Psychiatric/Behavioral: Negative for dysphoric mood and suicidal ideas.    Objective:  BP (!) 143/84 (BP Location: Left Arm, Patient Position: Sitting, Cuff Size: Large)   Pulse 78   Temp 97.8 F (36.6 C) (Oral)   Ht 5\' 4"  (1.626  m)   Wt (!) 303 lb 12.8 oz (137.8 kg)   LMP 11/10/2011   SpO2 97%   BMI 52.15 kg/m   BP/Weight 06/22/2016 06/15/2016 [id number]  Systolic BP [id number] [id number]  Diastolic BP 84 86 80  Wt. (Lbs) 303.8 303.4 -  BMI 52.15 52.08 -   Wt Readings from Last 3 Encounters:  06/22/16 (!) 303 lb 12.8 oz (137.8 kg)  06/15/16 (!) 303 lb 6.4 oz (137.6 kg)  03/22/16 (!) 302 lb 6.4 oz (137.2 kg)     Physical Exam  Constitutional: She is oriented to person, place, and time. She appears well-developed and well-nourished. No distress.  Obese   HENT:  Head:  Normocephalic and atraumatic.  Cardiovascular: Normal rate, regular rhythm, normal heart sounds and intact distal pulses.   Pulmonary/Chest: Effort normal and breath sounds normal.  Musculoskeletal: She exhibits no edema.       Hands: Neurological: She is alert and oriented to person, place, and time.  Skin: Skin is warm and dry. No rash noted.  Psychiatric: She has a normal mood and affect.   Lab Results  Component Value Date   HGBA1C 6.2 (H) 09/07/2013   Lab Results  Component Value Date   HGBA1C 6.1 06/22/2016    CBG 91  Assessment & Plan:  Thembi was seen today for hypertension.  Diagnoses and all orders for this visit:  Essential hypertension -     losartan (COZAAR) 50 MG tablet; Take 1 tablet (50 mg total) by mouth daily.  Elevated hemoglobin A1c -     POCT glycosylated hemoglobin (Hb A1C) -     Glucose (CBG)  Paronychia of left middle finger -     Discontinue: naproxen (NAPROSYN) 500 MG tablet; Take 1 tablet (500 mg total) by mouth 2 (two) times daily with a meal. -     naproxen (NAPROSYN) 500 MG tablet; Take 1 tablet (500 mg total) by mouth 2 (two) times daily with a meal.   There are no diagnoses linked to this encounter.  No orders of the defined types were placed in this encounter.   Follow-up: Return in about 2 weeks (around 07/06/2016) for HTN and paronychia .   Boykin Nearing MD

## 2016-07-08 ENCOUNTER — Other Ambulatory Visit: Payer: Self-pay | Admitting: Family Medicine

## 2016-07-08 DIAGNOSIS — Z1231 Encounter for screening mammogram for malignant neoplasm of breast: Secondary | ICD-10-CM

## 2016-07-20 ENCOUNTER — Other Ambulatory Visit: Payer: Self-pay | Admitting: Family Medicine

## 2016-07-20 DIAGNOSIS — G8929 Other chronic pain: Secondary | ICD-10-CM

## 2016-07-20 DIAGNOSIS — M545 Low back pain, unspecified: Secondary | ICD-10-CM

## 2016-07-20 DIAGNOSIS — M1711 Unilateral primary osteoarthritis, right knee: Secondary | ICD-10-CM

## 2016-07-20 MED ORDER — IBUPROFEN 800 MG PO TABS: 800.0000 mg | ORAL_TABLET | Freq: Three times a day (TID) | ORAL | 0 refills | Status: DC | PRN

## 2016-07-20 NOTE — Telephone Encounter (Signed)
Ibuprofen refilled and sent to [facility].

## 2016-07-20 NOTE — Telephone Encounter (Signed)
Patient called the office to request medication refill for ibuprofen (ADVIL,MOTRIN) 800 MG tablet. Please send it to [facility] on [address].   Thank you.

## 2016-09-29 ENCOUNTER — Encounter: Payer: Self-pay | Admitting: Family Medicine

## 2016-10-06 LAB — GLUCOSE, POCT (MANUAL RESULT ENTRY): POC GLUCOSE: 103 mg/dL — AB (ref 70–99)

## 2016-10-15 ENCOUNTER — Ambulatory Visit
Admission: RE | Admit: 2016-10-15 | Discharge: 2016-10-15 | Disposition: A | Discharge status: Home / Self Care | Payer: Medicare HMO | Source: Ambulatory Visit | Attending: Family Medicine | Admitting: Family Medicine

## 2016-10-15 DIAGNOSIS — Z1231 Encounter for screening mammogram for malignant neoplasm of breast: Secondary | ICD-10-CM

## 2016-11-23 ENCOUNTER — Other Ambulatory Visit (HOSPITAL_COMMUNITY)
Admission: RE | Admit: 2016-11-23 | Discharge: 2016-11-23 | Disposition: A | Discharge status: Home / Self Care | Payer: Medicare HMO | Source: Ambulatory Visit | Attending: Obstetrics and Gynecology | Admitting: Obstetrics and Gynecology

## 2016-11-23 ENCOUNTER — Other Ambulatory Visit: Payer: Self-pay | Admitting: Obstetrics and Gynecology

## 2016-11-23 DIAGNOSIS — N950 Postmenopausal bleeding: Secondary | ICD-10-CM | POA: Diagnosis not present

## 2016-11-23 DIAGNOSIS — N898 Other specified noninflammatory disorders of vagina: Secondary | ICD-10-CM | POA: Diagnosis not present

## 2016-11-23 DIAGNOSIS — N62 Hypertrophy of breast: Secondary | ICD-10-CM | POA: Diagnosis not present

## 2016-11-23 DIAGNOSIS — Z124 Encounter for screening for malignant neoplasm of cervix: Secondary | ICD-10-CM | POA: Insufficient documentation

## 2016-11-23 DIAGNOSIS — Z01411 Encounter for gynecological examination (general) (routine) with abnormal findings: Secondary | ICD-10-CM | POA: Diagnosis not present

## 2016-11-24 LAB — CYTOLOGY - PAP: DIAGNOSIS: NEGATIVE

## 2016-12-21 DIAGNOSIS — Z01818 Encounter for other preprocedural examination: Secondary | ICD-10-CM | POA: Diagnosis not present

## 2016-12-21 DIAGNOSIS — N950 Postmenopausal bleeding: Secondary | ICD-10-CM | POA: Diagnosis not present

## 2016-12-24 NOTE — Patient Instructions (Addendum)
Your procedure is scheduled on:  Wednesday, Aug. 22, 2018  Enter through the [employer] [facility] at:  7:15 AM  Pick up the phone at the desk and dial [phone].  Call this number if you have problems the morning of surgery: [phone].  Remember: Do NOT eat food or drink after:  Midnight Tuesday  Take these medicines the morning of surgery with a SIP OF WATER: None   Bring Asthma Inhaler day of surgery  Stop ALL herbal medications at this time  Do NOT smoke the day of surgery.  Do NOT wear jewelry (body piercing), metal hair clips/bobby pins, make-up, artifical eyelashes or nail polish. Do NOT wear lotions, powders, or perfumes.  You may wear deodorant. Do NOT shave for 48 hours prior to surgery. Do NOT bring valuables to the hospital. Contacts, dentures, or bridgework may not be worn into surgery.  Have a responsible adult drive you home and stay with you for 24 hours after your procedure  Bring a copy of your healthcare power of attorney and living will documents.

## 2016-12-27 ENCOUNTER — Other Ambulatory Visit: Payer: Self-pay

## 2016-12-27 ENCOUNTER — Encounter (HOSPITAL_COMMUNITY)
Admission: RE | Admit: 2016-12-27 | Discharge: 2016-12-27 | Disposition: A | Discharge status: Home / Self Care | Payer: Medicare HMO | Source: Ambulatory Visit | Attending: Obstetrics and Gynecology | Admitting: Obstetrics and Gynecology

## 2016-12-27 ENCOUNTER — Encounter (HOSPITAL_COMMUNITY): Payer: Self-pay

## 2016-12-27 DIAGNOSIS — Z01810 Encounter for preprocedural cardiovascular examination: Secondary | ICD-10-CM | POA: Insufficient documentation

## 2016-12-27 DIAGNOSIS — Z01812 Encounter for preprocedural laboratory examination: Secondary | ICD-10-CM | POA: Insufficient documentation

## 2016-12-27 DIAGNOSIS — N950 Postmenopausal bleeding: Secondary | ICD-10-CM | POA: Insufficient documentation

## 2016-12-27 HISTORY — DX: Sleep apnea, unspecified: G47.30

## 2016-12-27 LAB — BASIC METABOLIC PANEL
ANION GAP: 7 (ref 5–15)
BUN: 15 mg/dL (ref 6–20)
CALCIUM: 9.3 mg/dL (ref 8.9–10.3)
CO2: 28 mmol/L (ref 22–32)
Chloride: 105 mmol/L (ref 101–111)
Creatinine, Ser: 0.8 mg/dL (ref 0.44–1.00)
GFR calc Af Amer: >60 mL/min (ref >60)
GFR calc non Af Amer: >60 mL/min (ref >60)
GLUCOSE: 102 mg/dL — AB (ref 65–99)
Potassium: 3.9 mmol/L (ref 3.5–5.1)
Sodium: 140 mmol/L (ref 135–145)

## 2016-12-27 LAB — CBC
HEMATOCRIT: 40.6 % (ref 36.0–46.0)
Hemoglobin: 13.3 g/dL (ref 12.0–15.0)
MCH: 28.3 pg (ref 26.0–34.0)
MCHC: 32.8 g/dL (ref 30.0–36.0)
MCV: 86.4 fL (ref 78.0–100.0)
Platelets: 262 10*3/uL (ref 150–400)
RBC: 4.7 MIL/uL (ref 3.87–5.11)
RDW: 16.2 % — AB (ref 11.5–15.5)
WBC: 9.7 10*3/uL (ref 4.0–10.5)

## 2017-01-04 NOTE — H&P (Signed)
History of Present Illness  General:  Pt presents for preop for hysteroscopy,D&C, possible Myosure due to postmenopausal bleeding. Bleeding started 1 year ago. EMB was done that showed proliferative endometrium. EMS was 6 mm on ultrasound. 3 small fibroids noted. Pt was treated with Provera. Hyst D&C was planned for last year but pt postponed. Pt continues to have bleeding. Intermittent, dark, light.   Current Medications  Taking   Flovent HFA(Fluticasone Propionate HFA) 110 MCG/ACT Aerosol 1 puff Inhalation Twice a day   Pravastatin Sodium 40 MG Tablet 1 tablet Orally Once a day   Losartan Potassium 50 MG Tablet 1 tablet Orally Once a day   Omeprazole 20 MG Capsule Delayed Release 1 capsule Orally Once a day   Aspir-81(Aspirin) 81 MG Tablet Delayed Release 1 tablet Orally Once a day   Not-Taking   Provera(Medroxyprogesterone) 10 MG Tablet 1 tablet with food Orally Once a day   Provera(Medroxyprogesterone) 10 MG Tablet 1 tablet with food Orally Once a day   Nystatin 100000 UNIT/GM Powder 1 application to affected area Externally Twice a day x 14 days then once daily prn   Diflucan(Fluconazole) 150 MG Tablet 1 tablet Orally Once a day and repeat in 3 days prn   Medication List reviewed and reconciled with the patient    Past Medical History  Genital Herpes.   HTN.   Bronchitis.   GERD.           Surgical History  knee replacement 10/2008   Family History  Father: deceased, MI  Mother: deceased, stroke  Brother 1: deceased, cirrhosis of liver  Brother2: alive  Sister 1: deceased, stroke  Sister 2: deceased, heart disease  Sister 3: alive  2 living sisters, denies any GYN family cancer hx.   Social History  General:  Tobacco use  cigarettes: Never smoked Tobacco history last updated 12/21/2016 no Alcohol.  no Recreational drug use.  no Exercise, minimal.  Marital Status: single.  Children: Boys, 2.    Gyn History  Sexual activity not currently sexually active.   Periods : postmenopausal x 3 years.  LMP will sometimes spot here and there.  Denies H/O Birth control.  Last pap smear date 11/17/15, all negative.  Last mammogram date 10/15/16.  Abnormal pap smear thinks she had 1 bx but not sure.  STD HSV diagnosed 2013.  GYN procedures Endometrial Biopsy, 11/2015.    OB History  Number of pregnancies 2.  Pregnancy # 1 live birth, vaginal delivery.  Pregnancy # 2 live birth, vaginal delivery.    Allergies  Codeine (for allergy): stomach upset   Hospitalization/Major Diagnostic Procedure  childbirth x 2    Review of Systems  Denies fever/chills, chest pain, SOB, headaches, numbness/tingling. No h/o complication with anesthesia, bleeding disorders or blood clots.   Vital Signs  Wt 304, Ht 63, BMI 53.85, Pulse sitting 88, BP sitting 140/80.   Physical Examination  GENERAL:  Patient appears alert and oriented.  General Appearance: well-appearing, well-developed, no acute distress.  Speech: clear.  LUNGS:  Auscultation: no wheezing/rhonchi/rales. CTA bilaterally.  HEART:  Heart sounds: normal. RRR. no murmur.  ABDOMEN:  General: soft nontender, nondistended, no masses.  FEMALE GENITOURINARY:  Pelvic Not examined.  EXTREMITIES:  General: No edema or calf tenderness.     Assessments   1. Pre-operative clearance - Z01.818 (Primary)   2. Postmenopausal bleeding - N95.0, Likely due to obesity, increased estrogen.   Treatment  1. Postmenopausal bleeding  Notes: R/B/A of hysteroscopy D&C reviewed with pt.  Need to r/o hyperplasia. Use Myosure if needed prn submucosal fibroids.    Follow Up  2 Weeks post op

## 2017-01-05 ENCOUNTER — Ambulatory Visit (HOSPITAL_COMMUNITY): Payer: Medicare HMO | Admitting: Certified Registered Nurse Anesthetist

## 2017-01-05 ENCOUNTER — Ambulatory Visit (HOSPITAL_COMMUNITY)
Admission: RE | Admit: 2017-01-05 | Discharge: 2017-01-05 | Disposition: A | Discharge status: Home / Self Care | Payer: Medicare HMO | Source: Ambulatory Visit | Attending: Obstetrics and Gynecology | Admitting: Obstetrics and Gynecology

## 2017-01-05 ENCOUNTER — Encounter (HOSPITAL_COMMUNITY)
Admission: RE | Disposition: A | Discharge status: Home / Self Care | Payer: Self-pay | Source: Ambulatory Visit | Attending: Obstetrics and Gynecology

## 2017-01-05 ENCOUNTER — Encounter (HOSPITAL_COMMUNITY): Payer: Self-pay

## 2017-01-05 DIAGNOSIS — J45909 Unspecified asthma, uncomplicated: Secondary | ICD-10-CM | POA: Diagnosis not present

## 2017-01-05 DIAGNOSIS — N950 Postmenopausal bleeding: Secondary | ICD-10-CM | POA: Insufficient documentation

## 2017-01-05 DIAGNOSIS — Z7982 Long term (current) use of aspirin: Secondary | ICD-10-CM | POA: Diagnosis not present

## 2017-01-05 DIAGNOSIS — G8929 Other chronic pain: Secondary | ICD-10-CM

## 2017-01-05 DIAGNOSIS — Z793 Long term (current) use of hormonal contraceptives: Secondary | ICD-10-CM | POA: Insufficient documentation

## 2017-01-05 DIAGNOSIS — M1711 Unilateral primary osteoarthritis, right knee: Secondary | ICD-10-CM

## 2017-01-05 DIAGNOSIS — Z79899 Other long term (current) drug therapy: Secondary | ICD-10-CM | POA: Diagnosis not present

## 2017-01-05 DIAGNOSIS — N840 Polyp of corpus uteri: Secondary | ICD-10-CM | POA: Insufficient documentation

## 2017-01-05 DIAGNOSIS — K219 Gastro-esophageal reflux disease without esophagitis: Secondary | ICD-10-CM | POA: Diagnosis not present

## 2017-01-05 DIAGNOSIS — M545 Low back pain, unspecified: Secondary | ICD-10-CM

## 2017-01-05 DIAGNOSIS — I10 Essential (primary) hypertension: Secondary | ICD-10-CM | POA: Diagnosis not present

## 2017-01-05 HISTORY — PX: DILATATION & CURETTAGE/HYSTEROSCOPY WITH MYOSURE: SHX6511

## 2017-01-05 SURGERY — DILATATION & CURETTAGE/HYSTEROSCOPY WITH MYOSURE
Anesthesia: General

## 2017-01-05 MED ORDER — ONDANSETRON HCL 4 MG/2ML IJ SOLN: 4.0000 mg | Freq: Once | INTRAMUSCULAR | Status: DC | PRN

## 2017-01-05 MED ORDER — FENTANYL CITRATE (PF) 100 MCG/2ML IJ SOLN
INTRAMUSCULAR | Status: DC | PRN
  Administered 2017-01-05 (×2): 50 ug via INTRAVENOUS

## 2017-01-05 MED ORDER — MIDAZOLAM HCL 2 MG/2ML IJ SOLN
INTRAMUSCULAR | Status: AC
  Filled 2017-01-05: qty 2

## 2017-01-05 MED ORDER — GLYCOPYRROLATE 0.2 MG/ML IJ SOLN
INTRAMUSCULAR | Status: AC
  Filled 2017-01-05: qty 1

## 2017-01-05 MED ORDER — PANTOPRAZOLE SODIUM 40 MG PO TBEC
DELAYED_RELEASE_TABLET | ORAL | Status: AC
  Administered 2017-01-05: 40 mg via ORAL
  Filled 2017-01-05: qty 1

## 2017-01-05 MED ORDER — SCOPOLAMINE 1 MG/3DAYS TD PT72
1.0000 | MEDICATED_PATCH | Freq: Once | TRANSDERMAL | Status: DC
  Administered 2017-01-05: 1.5 mg via TRANSDERMAL

## 2017-01-05 MED ORDER — LACTATED RINGERS IV SOLN
INTRAVENOUS | Status: DC
  Administered 2017-01-05 (×2): via INTRAVENOUS

## 2017-01-05 MED ORDER — FENTANYL CITRATE (PF) 100 MCG/2ML IJ SOLN
INTRAMUSCULAR | Status: AC
  Administered 2017-01-05: 50 ug via INTRAVENOUS
  Filled 2017-01-05: qty 2

## 2017-01-05 MED ORDER — LIDOCAINE HCL 2 % IJ SOLN
INTRAMUSCULAR | Status: DC | PRN
  Administered 2017-01-05: 8 mL

## 2017-01-05 MED ORDER — PANTOPRAZOLE SODIUM 40 MG PO TBEC
40.0000 mg | DELAYED_RELEASE_TABLET | Freq: Once | ORAL | Status: AC
  Administered 2017-01-05: 40 mg via ORAL

## 2017-01-05 MED ORDER — LIDOCAINE HCL (CARDIAC) 20 MG/ML IV SOLN
INTRAVENOUS | Status: DC | PRN
  Administered 2017-01-05: 50 mg via INTRAVENOUS

## 2017-01-05 MED ORDER — ACETAMINOPHEN 160 MG/5ML PO SOLN
325.0000 mg | ORAL | Status: DC | PRN
Start: 2017-01-05 — End: 2017-01-05

## 2017-01-05 MED ORDER — PROPOFOL 10 MG/ML IV BOLUS
INTRAVENOUS | Status: DC | PRN
  Administered 2017-01-05: 160 mg via INTRAVENOUS

## 2017-01-05 MED ORDER — ONDANSETRON HCL 4 MG/2ML IJ SOLN
INTRAMUSCULAR | Status: AC
  Filled 2017-01-05: qty 2

## 2017-01-05 MED ORDER — PHENYLEPHRINE HCL 10 MG/ML IJ SOLN
INTRAMUSCULAR | Status: DC | PRN
  Administered 2017-01-05 (×2): .04 ug via INTRAVENOUS
  Administered 2017-01-05: .08 ug via INTRAVENOUS

## 2017-01-05 MED ORDER — FENTANYL CITRATE (PF) 100 MCG/2ML IJ SOLN
25.0000 ug | INTRAMUSCULAR | Status: DC | PRN
  Administered 2017-01-05 (×2): 50 ug via INTRAVENOUS

## 2017-01-05 MED ORDER — KETOROLAC TROMETHAMINE 30 MG/ML IJ SOLN
INTRAMUSCULAR | Status: DC | PRN
  Administered 2017-01-05: 30 mg via INTRAVENOUS

## 2017-01-05 MED ORDER — ALBUTEROL SULFATE (2.5 MG/3ML) 0.083% IN NEBU: 3.0000 mL | INHALATION_SOLUTION | RESPIRATORY_TRACT | Status: DC

## 2017-01-05 MED ORDER — ALBUTEROL SULFATE HFA 108 (90 BASE) MCG/ACT IN AERS
INHALATION_SPRAY | RESPIRATORY_TRACT | Status: AC
  Filled 2017-01-05: qty 6.7

## 2017-01-05 MED ORDER — KETOROLAC TROMETHAMINE 30 MG/ML IJ SOLN
30.0000 mg | Freq: Once | INTRAMUSCULAR | Status: DC | PRN
Start: 2017-01-05 — End: 2017-01-05

## 2017-01-05 MED ORDER — IBUPROFEN 800 MG PO TABS: 800.0000 mg | ORAL_TABLET | Freq: Three times a day (TID) | ORAL | 0 refills | Status: DC | PRN

## 2017-01-05 MED ORDER — DEXAMETHASONE SODIUM PHOSPHATE 10 MG/ML IJ SOLN
INTRAMUSCULAR | Status: DC | PRN
  Administered 2017-01-05: 4 mg via INTRAVENOUS

## 2017-01-05 MED ORDER — PHENYLEPHRINE 40 MCG/ML (10ML) SYRINGE FOR IV PUSH (FOR BLOOD PRESSURE SUPPORT)
PREFILLED_SYRINGE | INTRAVENOUS | Status: AC
  Filled 2017-01-05: qty 10

## 2017-01-05 MED ORDER — SCOPOLAMINE 1 MG/3DAYS TD PT72
MEDICATED_PATCH | TRANSDERMAL | Status: AC
  Administered 2017-01-05: 1.5 mg via TRANSDERMAL
  Filled 2017-01-05: qty 1

## 2017-01-05 MED ORDER — MEPERIDINE HCL 25 MG/ML IJ SOLN: 6.2500 mg | INTRAMUSCULAR | Status: DC | PRN

## 2017-01-05 MED ORDER — EPHEDRINE 5 MG/ML INJ
INTRAVENOUS | Status: AC
  Filled 2017-01-05: qty 10

## 2017-01-05 MED ORDER — LIDOCAINE HCL 2 % IJ SOLN
INTRAMUSCULAR | Status: AC
Start: 2017-01-05 — End: ?
  Filled 2017-01-05: qty 20

## 2017-01-05 MED ORDER — FENTANYL CITRATE (PF) 100 MCG/2ML IJ SOLN
INTRAMUSCULAR | Status: AC
  Filled 2017-01-05: qty 2

## 2017-01-05 MED ORDER — PROPOFOL 10 MG/ML IV BOLUS
INTRAVENOUS | Status: AC
  Filled 2017-01-05: qty 20

## 2017-01-05 MED ORDER — ACETAMINOPHEN 325 MG PO TABS: 325.0000 mg | ORAL_TABLET | ORAL | Status: DC | PRN

## 2017-01-05 MED ORDER — MIDAZOLAM HCL 2 MG/2ML IJ SOLN
INTRAMUSCULAR | Status: DC | PRN
  Administered 2017-01-05: 2 mg via INTRAVENOUS

## 2017-01-05 MED ORDER — SODIUM CHLORIDE 0.9 % IR SOLN
Status: DC | PRN
  Administered 2017-01-05: 1000 mL
  Administered 2017-01-05: 3000 mL

## 2017-01-05 MED ORDER — SILVER NITRATE-POT NITRATE 75-25 % EX MISC
CUTANEOUS | Status: AC
  Filled 2017-01-05: qty 1

## 2017-01-05 MED ORDER — ONDANSETRON HCL 4 MG/2ML IJ SOLN
INTRAMUSCULAR | Status: DC | PRN
  Administered 2017-01-05: 4 mg via INTRAVENOUS

## 2017-01-05 MED ORDER — EPHEDRINE SULFATE 50 MG/ML IJ SOLN
INTRAMUSCULAR | Status: DC | PRN
  Administered 2017-01-05: 10 mg via INTRAVENOUS

## 2017-01-05 MED ORDER — GLYCOPYRROLATE 0.2 MG/ML IJ SOLN
INTRAMUSCULAR | Status: DC | PRN
  Administered 2017-01-05: 0.2 mg via INTRAVENOUS

## 2017-01-05 MED ORDER — DEXAMETHASONE SODIUM PHOSPHATE 4 MG/ML IJ SOLN
INTRAMUSCULAR | Status: AC
  Filled 2017-01-05: qty 1

## 2017-01-05 MED ORDER — LIDOCAINE HCL 2 % IJ SOLN
INTRAMUSCULAR | Status: AC
  Filled 2017-01-05: qty 20

## 2017-01-05 SURGICAL SUPPLY — 18 items
CANISTER SUCT 3000ML PPV (MISCELLANEOUS) ×2 IMPLANT
CATH ROBINSON RED A/P 16FR (CATHETERS) ×2 IMPLANT
CLOTH BEACON ORANGE TIMEOUT ST (SAFETY) ×2 IMPLANT
CONTAINER PREFILL 10% NBF 60ML (FORM) ×4 IMPLANT
DEVICE MYOSURE LITE (MISCELLANEOUS) IMPLANT
DEVICE MYOSURE REACH (MISCELLANEOUS) IMPLANT
DILATOR CANAL MILEX (MISCELLANEOUS) ×1 IMPLANT
FILTER ARTHROSCOPY CONVERTOR (FILTER) ×2 IMPLANT
GLOVE BIO SURGEON STRL SZ7 (GLOVE) ×2 IMPLANT
GLOVE BIOGEL PI IND STRL 7.0 (GLOVE) ×2 IMPLANT
GLOVE BIOGEL PI INDICATOR 7.0 (GLOVE) ×2
GOWN STRL REUS W/TWL LRG LVL3 (GOWN DISPOSABLE) ×4 IMPLANT
PACK VAGINAL MINOR WOMEN LF (CUSTOM PROCEDURE TRAY) ×2 IMPLANT
PAD OB MATERNITY 4.3X12.25 (PERSONAL CARE ITEMS) ×2 IMPLANT
SEAL ROD LENS SCOPE MYOSURE (ABLATOR) ×2 IMPLANT
TOWEL OR 17X24 6PK STRL BLUE (TOWEL DISPOSABLE) ×4 IMPLANT
TUBING AQUILEX INFLOW (TUBING) ×2 IMPLANT
TUBING AQUILEX OUTFLOW (TUBING) ×2 IMPLANT

## 2017-01-05 NOTE — Transfer of Care (Signed)
Immediate Anesthesia Transfer of Care Note  Patient: Leah Hampton  Procedure(s) Performed: Procedure(s) with comments: DILATATION & CURETTAGE/HYSTEROSCOPY WITH MYOSURE (N/A) - PostMenopausal Bleeding  Patient Location: PACU  Anesthesia Type:General  Level of Consciousness: awake, alert  and oriented  Airway & Oxygen Therapy: Patient Spontanous Breathing and Patient connected to nasal cannula oxygen  Post-op Assessment: Report given to RN and Post -op Vital signs reviewed and stable  Post vital signs: Reviewed and stable  Last Vitals:  Vitals:   01/05/17 0726  BP: (!) 147/83  Pulse: 85  Resp: (!) 22  Temp: 36.7 C  SpO2: 98%    Last Pain:  Vitals:   01/05/17 0726  TempSrc: Oral      Patients Stated Pain Goal: 5 (91/63/84 [id number])  Complications: No apparent anesthesia complications and back pain

## 2017-01-05 NOTE — Anesthesia Preprocedure Evaluation (Signed)
Anesthesia Evaluation  Patient identified by MRN, date of birth, ID band Patient awake    Reviewed: Allergy & Precautions, NPO status , Patient's Chart, lab work & pertinent test results  Airway Mallampati: II       Dental no notable dental hx. (+) Teeth Intact   Pulmonary asthma ,  Will use an Albuterol inhaler prior to OR   Pulmonary exam normal breath sounds clear to auscultation       Cardiovascular hypertension, Pt. on medications  Rhythm:Regular Rate:Normal     Neuro/Psych negative psych ROS   GI/Hepatic Neg liver ROS, GERD  Medicated and Controlled,  Endo/Other  Morbid obesity  Renal/GU negative Renal ROS     Musculoskeletal   Abdominal (+) + obese,   Peds  Hematology negative hematology ROS (+)   Anesthesia Other Findings   Reproductive/Obstetrics                             Anesthesia Physical Anesthesia Plan  ASA: III  Anesthesia Plan: General   Post-op Pain Management:    Induction: Intravenous  PONV Risk Score and Plan: 4 or greater and Ondansetron, Dexamethasone, Midazolam, Scopolamine patch - Pre-op and Propofol infusion  Airway Management Planned: LMA  Additional Equipment:   Intra-op Plan:   Post-operative Plan:   Informed Consent: I have reviewed the patients History and Physical, chart, labs and discussed the procedure including the risks, benefits and alternatives for the proposed anesthesia with the patient or authorized representative who has indicated his/her understanding and acceptance.     Plan Discussed with: CRNA and Surgeon  Anesthesia Plan Comments:         Anesthesia Quick Evaluation

## 2017-01-05 NOTE — Discharge Instructions (Addendum)
DISCHARGE INSTRUCTIONS: D&C / D&E The following instructions have been prepared to help you care for yourself upon your return home.   Personal hygiene:  Use sanitary pads for vaginal drainage, not tampons.  Shower the day after your procedure.  NO tub baths, pools or Jacuzzis for 2-3 weeks.  Wipe front to back after using the bathroom.  Activity and limitations:  Do NOT drive or operate any equipment for 24 hours. The effects of anesthesia are still present and drowsiness may result.  Do NOT rest in bed all day.  Walking is encouraged.  Walk up and down stairs slowly.  You may resume your normal activity in one to two days or as indicated by your physician.  Sexual activity: NO intercourse for at least 2 weeks after the procedure, or as indicated by your physician.  Diet: Eat a light meal as desired this evening. You may resume your usual diet tomorrow.  Return to work: You may resume your work activities in one to two days or as indicated by your doctor.  What to expect after your surgery: Expect to have vaginal bleeding/discharge for 2-3 days and spotting for up to 10 days. It is not unusual to have soreness for up to 1-2 weeks. You may have a slight burning sensation when you urinate for the first day. Mild cramps may continue for a couple of days. You may have a regular period in 2-6 weeks.  Call your doctor for any of the following:  Excessive vaginal bleeding, saturating and changing one pad every hour.  Inability to urinate 6 hours after discharge from hospital.  Pain not relieved by pain medication.  Fever of 100.4 F or greater.  Unusual vaginal discharge or odor.   Call for an appointment:    Patients signature: ______________________  Nurses signature ________________________  Support person's signature_______________________   Dilation and Curettage or Vacuum Curettage, Care After This sheet gives you information about how to care for yourself  after your procedure. Your health care provider may also give you more specific instructions. If you have problems or questions, contact your health care provider. What can I expect after the procedure? After your procedure, it is common to have:  Mild pain or cramping.  Some vaginal bleeding or spotting.  These may last for up to 2 weeks after your procedure. Follow these instructions at home: Activity   Do not drive or use heavy machinery while taking prescription pain medicine.  Avoid driving for the first 24 hours after your procedure.  Take frequent, short walks, followed by rest periods, throughout the day. Ask your health care provider what activities are safe for you. After 1-2 days, you may be able to return to your normal activities.  Do not lift anything heavier than 10 lb (4.5 kg) until your health care provider approves.  For at least 2 weeks, or as long as told by your health care provider, do not: ? Douche. ? Use tampons. ? Have sexual intercourse. General instructions   Take over-the-counter and prescription medicines only as told by your health care provider. This is especially important if you take blood thinning medicine.  Do not take baths, swim, or use a hot tub until your health care provider approves. Take showers instead of baths.  Wear compression stockings as told by your health care provider. These stockings help to prevent blood clots and reduce swelling in your legs.  It is your responsibility to get the results of your procedure. Ask your health  care provider, or the department performing the procedure, when your results will be ready.  Keep all follow-up visits as told by your health care provider. This is important. Contact a health care provider if:  You have severe cramps that get worse or that do not get better with medicine.  You have severe abdominal pain.  You cannot drink fluids without vomiting.  You develop pain in a different area  of your pelvis.  You have bad-smelling vaginal discharge.  You have a rash. Get help right away if:  You have vaginal bleeding that soaks more than one sanitary pad in 1 hour, for 2 hours in a row.  You pass large blood clots from your vagina.  You have a fever that is above 100.23F (38.0C).  Your abdomen feels very tender or hard.  You have chest pain.  You have shortness of breath.  You cough up blood.  You feel dizzy or light-headed.  You faint.  You have pain in your neck or shoulder area. This information is not intended to replace advice given to you by your health care provider. Make sure you discuss any questions you have with your health care provider. Document Released: 04/30/2000 Document Revised: 12/31/2015 Document Reviewed: 12/04/2015 Elsevier Interactive Patient Education  [employer].

## 2017-01-05 NOTE — Anesthesia Procedure Notes (Signed)
Procedure Name: LMA Insertion Date/Time: 01/05/2017 9:18 AM Performed by: Bufford Spikes Pre-anesthesia Checklist: Patient identified, Emergency Drugs available, Suction available and Patient being monitored Patient Re-evaluated:Patient Re-evaluated prior to induction Oxygen Delivery Method: Circle system utilized Preoxygenation: Pre-oxygenation with 100% oxygen Induction Type: IV induction Ventilation: Mask ventilation without difficulty LMA: LMA inserted LMA Size: 4.0 Number of attempts: 1 Airway Equipment and Method: Patient positioned with wedge pillow Placement Confirmation: positive ETCO2 Tube secured with: Tape Dental Injury: Teeth and Oropharynx as per pre-operative assessment

## 2017-01-05 NOTE — Anesthesia Postprocedure Evaluation (Signed)
Anesthesia Post Note  Patient: Leah Hampton  Procedure(s) Performed: Procedure(s) (LRB): DILATATION & CURETTAGE/HYSTEROSCOPY WITH MYOSURE (N/A)     Patient location during evaluation: PACU Anesthesia Type: General Level of consciousness: awake Pain management: pain level controlled Vital Signs Assessment: post-procedure vital signs reviewed and stable Respiratory status: spontaneous breathing Cardiovascular status: stable Postop Assessment: no signs of nausea or vomiting Anesthetic complications: no    Last Vitals:  Vitals:   01/05/17 1030 01/05/17 1045  BP: (!) 148/72   Pulse: 96 99  Resp: 14 17  Temp:    SpO2: 99% 97%    Last Pain:  Vitals:   01/05/17 0726  TempSrc: Oral   Pain Goal: Patients Stated Pain Goal: 5 (01/05/17 0726)               Yonas Bunda JR,JOHN Mateo Flow

## 2017-01-05 NOTE — Interval H&P Note (Signed)
History and Physical Interval Note:  01/05/2017 7:18 AM  Leah Hampton  has presented today for surgery, with the diagnosis of N95.0 PMB  The various methods of treatment have been discussed with the patient and family. After consideration of risks, benefits and other options for treatment, the patient has consented to  Procedure(s) with comments: Severy (N/A) - PostMenopausal Bleeding as a surgical intervention .  The patient's history has been reviewed, patient examined, no change in status, stable for surgery.  I have reviewed the patient's chart and labs.  Questions were answered to the patient's satisfaction.     Simona Huh, Amandajo Gonder

## 2017-01-05 NOTE — Brief Op Note (Signed)
01/05/2017  9:53 AM  PATIENT:  Leah Hampton  61 y.o. female  PRE-OPERATIVE DIAGNOSIS:  N95.0 PMB  POST-OPERATIVE DIAGNOSIS:  N95.0 PMB, endometrial polyp  PROCEDURE:  Procedure(s) with comments: DILATATION & CURETTAGE/HYSTEROSCOPY WITH MYOSURE (N/A) - PostMenopausal Bleeding  Endometrial polypectomy  SURGEON:  Surgeon(s) and Role:    Thurnell Lose, MD - Primary  PHYSICIAN ASSISTANT: None  ASSISTANTS: none   ANESTHESIA:   general  EBL:  Total I/O In: 1000 [I.V.:1000] Out: 35 [Urine:50; Blood:20]  BLOOD ADMINISTERED:none  DRAINS: none   LOCAL MEDICATIONS USED:  LIDOCAINE  and Amount: 8 ml  SPECIMEN:  Source of Specimen:  endometrial polyp and currettings  DISPOSITION OF SPECIMEN:  PATHOLOGY  COUNTS:  YES  TOURNIQUET:  * No tourniquets in log *  DICTATION: .Other Dictation: Dictation Number [id number]  PLAN OF CARE: Discharge to home after PACU  PATIENT DISPOSITION:  PACU - hemodynamically stable.   Delay start of Pharmacological VTE agent (>24hrs) due to surgical blood loss or risk of bleeding: yes

## 2017-01-06 ENCOUNTER — Encounter (HOSPITAL_COMMUNITY): Payer: Self-pay | Admitting: Obstetrics and Gynecology

## 2017-01-06 NOTE — Op Note (Signed)
NAMEKSENIA, KUNZ NO.:  [id number]  MEDICAL RECORD NO.:  [id number]  LOCATION:                                 FACILITY:  PHYSICIAN:  Jola Schmidt, MD   DATE OF BIRTH:  05-07-1956  DATE OF PROCEDURE:  01/05/2017 DATE OF DISCHARGE:  01/05/2017                              OPERATIVE REPORT   PREOPERATIVE DIAGNOSIS:  Postmenopausal bleeding.  POSTOPERATIVE DIAGNOSIS:  Postmenopausal bleeding and endometrial polyp.  PROCEDURE:  Hysteroscopy, D and C with endometrial polypectomy via MyoSure.  SURGEON:  Jola Schmidt, MD.  ASSISTANT:  None.  ANESTHESIA:  General anesthesia.  BLOOD:  20 mL.  LOCAL:  With 2% lidocaine 8 mL.  SPECIMEN:  Endometrial polyp and curettings to Pathology.  PATIENT DISPOSITION:  To PACU, hemodynamically stable.  COMPLICATIONS:  None.  FINDINGS:  Proliferative appearing endometrium.  Calcifications on the anterior uterus and a long endometrial polyp.  Normal shape of the endometrial cavity.  Also, she had a small endocervical polyp.  PROCEDURE IN DETAIL:  Ms. Piano was taken to the operating room with IV running.  She was placed in the dorsal lithotomy position and underwent LMA anesthesia without complication.  She was then prepped and draped in normal sterile fashion.  Time-out was taken.  SCDs were on her legs and operating throughout the entire procedure.  A Graves speculum was inserted into the vagina.  A paracervical block was performed with lidocaine.  Single-tooth tenaculum was applied to the anterior lip of the cervix, and cervix was dilated up to a 6. Hysteroscope was then advanced and the internal os appeared to be tight. I then went back and dilated up to a 7 and then I was able to pass the hysteroscope and the findings above were noted.  The MyoSure was then used to resect the polyp, but then I also did a directed curettage with the MyoSure, I removed the calcifications. There was no bleeding noted.  I  did a curettage with a MyoSure of the entire uterine cavity.  No fibroids seen.  Once the MyoSure was complete that was removed, the hysteroscope was removed from the vagina.  I then did a sharp curettage of the inside of the uterus and that specimen was sent with the tissue in the site.  Single-tooth tenaculum was removed from the anterior lip of the cervix. Silver nitrate was applied for hemostasis at the tenaculum sites.  All instrument and sponge counts were correct x3.  The patient did well under anesthesia.     Jola Schmidt, MD     EBV/MEDQ  D:  01/05/2017  T:  01/05/2017  Job:  [id number]

## 2017-01-12 DIAGNOSIS — Z780 Asymptomatic menopausal state: Secondary | ICD-10-CM | POA: Diagnosis not present

## 2017-01-19 DIAGNOSIS — R6889 Other general symptoms and signs: Secondary | ICD-10-CM | POA: Diagnosis not present

## 2017-05-02 ENCOUNTER — Ambulatory Visit: Payer: Medicare HMO | Admitting: Nurse Practitioner

## 2017-05-06 ENCOUNTER — Telehealth: Payer: Self-pay | Admitting: Family Medicine

## 2017-05-06 NOTE — Telephone Encounter (Signed)
Phramacy called for a refill on Losartan 50mg , Omeprazole 20MG , Pravastatin 40 MG and flovent HFA 110MCG

## 2017-05-06 NOTE — Telephone Encounter (Signed)
Patient has not established with new provider and canceled last appt without rescheduling. I received fax from [facility], will fax them that patient needs appt for refills.

## 2017-05-23 ENCOUNTER — Encounter: Payer: Self-pay | Admitting: Nurse Practitioner

## 2017-05-23 ENCOUNTER — Ambulatory Visit: Payer: Medicare HMO | Attending: Nurse Practitioner | Admitting: Nurse Practitioner

## 2017-05-23 VITALS — BP 143/86 | HR 86 | Temp 98.2°F | Resp 14 | Ht 64.0 in | Wt 313.0 lb

## 2017-05-23 DIAGNOSIS — Z96659 Presence of unspecified artificial knee joint: Secondary | ICD-10-CM | POA: Diagnosis not present

## 2017-05-23 DIAGNOSIS — R05 Cough: Secondary | ICD-10-CM | POA: Diagnosis present

## 2017-05-23 DIAGNOSIS — Z882 Allergy status to sulfonamides status: Secondary | ICD-10-CM | POA: Insufficient documentation

## 2017-05-23 DIAGNOSIS — Z760 Encounter for issue of repeat prescription: Secondary | ICD-10-CM | POA: Diagnosis not present

## 2017-05-23 DIAGNOSIS — Z7982 Long term (current) use of aspirin: Secondary | ICD-10-CM | POA: Insufficient documentation

## 2017-05-23 DIAGNOSIS — Z79899 Other long term (current) drug therapy: Secondary | ICD-10-CM | POA: Insufficient documentation

## 2017-05-23 DIAGNOSIS — Z7951 Long term (current) use of inhaled steroids: Secondary | ICD-10-CM | POA: Insufficient documentation

## 2017-05-23 DIAGNOSIS — J209 Acute bronchitis, unspecified: Secondary | ICD-10-CM | POA: Insufficient documentation

## 2017-05-23 DIAGNOSIS — J440 Chronic obstructive pulmonary disease with acute lower respiratory infection: Secondary | ICD-10-CM | POA: Insufficient documentation

## 2017-05-23 DIAGNOSIS — G4730 Sleep apnea, unspecified: Secondary | ICD-10-CM | POA: Insufficient documentation

## 2017-05-23 DIAGNOSIS — E782 Mixed hyperlipidemia: Secondary | ICD-10-CM | POA: Diagnosis not present

## 2017-05-23 DIAGNOSIS — I10 Essential (primary) hypertension: Secondary | ICD-10-CM | POA: Diagnosis not present

## 2017-05-23 DIAGNOSIS — Z6841 Body Mass Index (BMI) 40.0 and over, adult: Secondary | ICD-10-CM | POA: Insufficient documentation

## 2017-05-23 DIAGNOSIS — K219 Gastro-esophageal reflux disease without esophagitis: Secondary | ICD-10-CM

## 2017-05-23 DIAGNOSIS — M1711 Unilateral primary osteoarthritis, right knee: Secondary | ICD-10-CM | POA: Diagnosis not present

## 2017-05-23 DIAGNOSIS — K210 Gastro-esophageal reflux disease with esophagitis: Secondary | ICD-10-CM | POA: Insufficient documentation

## 2017-05-23 DIAGNOSIS — E669 Obesity, unspecified: Secondary | ICD-10-CM | POA: Diagnosis not present

## 2017-05-23 DIAGNOSIS — J441 Chronic obstructive pulmonary disease with (acute) exacerbation: Secondary | ICD-10-CM | POA: Diagnosis not present

## 2017-05-23 MED ORDER — LOSARTAN POTASSIUM 50 MG PO TABS: 50.0000 mg | ORAL_TABLET | Freq: Every day | ORAL | 0 refills | Status: DC

## 2017-05-23 MED ORDER — OMEPRAZOLE 20 MG PO CPDR: 20.0000 mg | DELAYED_RELEASE_CAPSULE | Freq: Every day | ORAL | 0 refills | Status: DC

## 2017-05-23 MED ORDER — PRAVASTATIN SODIUM 40 MG PO TABS: 40.0000 mg | ORAL_TABLET | Freq: Every day | ORAL | 0 refills | Status: DC

## 2017-05-23 MED ORDER — FLUTICASONE PROPIONATE HFA 110 MCG/ACT IN AERO: 2.0000 | INHALATION_SPRAY | Freq: Two times a day (BID) | RESPIRATORY_TRACT | 5 refills | Status: DC

## 2017-05-23 MED ORDER — AZITHROMYCIN 250 MG PO TABS: ORAL_TABLET | ORAL | 0 refills | Status: DC

## 2017-05-23 NOTE — Patient Instructions (Addendum)

## 2017-05-23 NOTE — Progress Notes (Signed)
Assessment & Plan:  Leah Hampton was seen today for cough.  Diagnoses and all orders for this visit:  Obstructive chronic bronchitis with exacerbation (HCC) -     fluticasone (FLOVENT HFA) 110 MCG/ACT inhaler; Inhale 2 puffs into the lungs 2 (two) times daily. -     azithromycin (ZITHROMAX) 250 MG tablet; Take 2 tablets on day one and then take 1 tablet on days 2-5. -     Pulmonary function test; Future  Morbid obesity, unspecified obesity type (Leah Hampton) Discussed diet and exercise for person with BMI >25. Instructed: You must burn more calories than you eat. Losing 5 percent of your body weight should be considered a success. In the longer term, losing more than 15 percent of your body weight and staying at this weight is an extremely good result. However, keep in mind that even losing 5 percent of your body weight leads to important health benefits, so try not to get discouraged if you're not able to lose more than this. Will recheck weight in 3-6 months.  Essential hypertension -     Basic metabolic panel -     CBC -     Lipid panel -     losartan (COZAAR) 50 MG tablet; Take 1 tablet (50 mg total) by mouth daily. Continue all antihypertensives as prescribed.  Remember to bring in your blood pressure log with you for your follow up appointment.  DASH/Mediterranean Diets are healthier choices for HTN.    Mixed hyperlipidemia -     pravastatin (PRAVACHOL) 40 MG tablet; Take 1 tablet (40 mg total) by mouth daily.  Work on low fat, heart healthy diet and participate in regular aerobic exercise program to control as well. Exercise at least 150 minutes per week  Gastroesophageal reflux disease, esophagitis presence not specified -     omeprazole (PRILOSEC) 20 MG capsule; Take 1 capsule (20 mg total) by mouth daily. Avoid GERD triggers    Patient has been counseled on age-appropriate routine health concerns for screening and prevention. These are reviewed and up-to-date. Referrals have been  placed accordingly. Immunizations are up-to-date or declined.    Subjective:   Chief Complaint  Patient presents with  . Cough    Patient is here for a cough. Patient stated she had it for a few weeks and the cough with yellow mucus. Patient stated she need medication refills.    HPI Leah Hampton 62 y.o. female presents to office today for upper respiratory symptoms. She has not been consistent with follow up and has not been seen in this office since 06-2016. She is requesting refills of medications today and is also here to establish care.  She has a history of chronic bronchitis which she reports has been minimally relieved on flovent. She was prescribed Qvar in the past but reports she could not afford it. She also had a PFT that was ordered by her previous PCP however that was never performed.   Acute Bronchitis Patient presents for presents evaluation of productive cough with sputum described as yellow and green and some blood tinge, sore throat and wheezing. Symptoms began 2 weeks ago and are gradually worsening since that time.  Past history is significant for chronic bronchitis. The cough and is aggravated by nothing. She has tried OTC cough medicine Robitussin with no relief of symptoms.   Essential Hypertension She does not check her blood pressure at home and does not have a blood pressure cuff. Her blood pressure is poorly controlled. She  is not medication or diet compliant. Denies chest pain, shortness of breath, palpitations, lightheadedness, dizziness, headaches or BLE edema.  BP Readings from Last 3 Encounters:  05/23/17 (!) 143/86  01/05/17 (!) 158/85  12/27/16 121/76     Review of Systems  Constitutional: Negative for fever, malaise/fatigue and weight loss.  HENT: Positive for congestion and sore throat. Negative for nosebleeds.   Eyes: Negative.  Negative for blurred vision, double vision and photophobia.  Respiratory: Positive for cough, shortness of breath and  wheezing.   Cardiovascular: Negative.  Negative for chest pain, palpitations and leg swelling.  Gastrointestinal: Positive for heartburn. Negative for abdominal pain, constipation, diarrhea, nausea and vomiting.  Musculoskeletal: Negative.  Negative for myalgias.  Neurological: Negative.  Negative for dizziness, focal weakness, seizures and headaches.  Endo/Heme/Allergies: Positive for environmental allergies.  Psychiatric/Behavioral: Negative.  Negative for suicidal ideas.    Past Medical History:  Diagnosis Date  . Bronchitis   . Cystitis   . Hematuria    urologice eval, Dr Estill Dooms  . History of chest pain   . Hyperbilirubinemia   . Hyperlipemia   . Hypertelorism   . Hypertension   . Leukocytosis   . Obesity   . Osteoarthritis    right knee  . Sleep apnea 2012   slight     Past Surgical History:  Procedure Laterality Date  . COLONOSCOPY WITH PROPOFOL N/A 03/27/2015   Procedure: COLONOSCOPY WITH PROPOFOL;  Surgeon: Milus Banister, MD;  Location: [facility];  Service: Endoscopy;  Laterality: N/A;  . DILATATION & CURETTAGE/HYSTEROSCOPY WITH MYOSURE N/A 01/05/2017   Procedure: DILATATION & CURETTAGE/HYSTEROSCOPY WITH MYOSURE;  Surgeon: Thurnell Lose, MD;  Location: [facility];  Service: Gynecology;  Laterality: N/A;  PostMenopausal Bleeding  . KNEE ARTHROSCOPY  04/2007   left knee  . TOTAL KNEE ARTHROPLASTY  10/2008   Dr Roxine Caddy    Family History  Problem Relation Age of Onset  . Heart failure Mother   . Heart attack Father 27  . Diabetes Unknown   . Arthritis Unknown   . Stroke Unknown   . Coronary artery disease Unknown   . Heart failure Sister        x 2  . Colon cancer Neg Hx     Social History Reviewed with no changes to be made today.   Outpatient Medications Prior to Visit  Medication Sig Dispense Refill  . albuterol (PROVENTIL HFA;VENTOLIN HFA) 108 (90 Base) MCG/ACT inhaler Inhale 2 puffs into the lungs every 6 (six) hours as needed for wheezing or shortness  of breath. 3 Inhaler 5  . aspirin EC 81 MG tablet Take 81 mg by mouth daily.    Marland Kitchen ibuprofen (ADVIL,MOTRIN) 800 MG tablet Take 1 tablet (800 mg total) by mouth every 8 (eight) hours as needed. 10 tablet 0  . fluticasone (FLOVENT HFA) 110 MCG/ACT inhaler Inhale 2 puffs into the lungs 2 (two) times daily. 12 g 5  . losartan (COZAAR) 50 MG tablet Take 1 tablet (50 mg total) by mouth daily. (Patient taking differently: Take 50 mg by mouth every evening. ) 90 tablet 3  . omeprazole (PRILOSEC) 20 MG capsule Take 1 capsule (20 mg total) by mouth daily. 30 capsule 5  . pravastatin (PRAVACHOL) 40 MG tablet Take 1 tablet (40 mg total) by mouth daily. (Patient taking differently: Take 40 mg by mouth daily at 6 PM. ) 30 tablet 5   No facility-administered medications prior to visit.     Allergies  Allergen Reactions  .  Sulfamethoxazole-Trimethoprim [Bactrim] Itching  . Codeine Nausea And Vomiting and Anxiety       Objective:    BP (!) 143/86 (BP Location: Left Arm, Patient Position: Sitting, Cuff Size: Large)   Pulse 86   Temp 98.2 F (36.8 C) (Oral)   Resp 14   Ht 5\' 4"  (1.626 m)   Wt (!) 313 lb (142 kg)   LMP 11/10/2011   SpO2 94%   BMI 53.73 kg/m  Wt Readings from Last 3 Encounters:  05/23/17 (!) 313 lb (142 kg)  12/27/16 (!) 300 lb 4 oz (136.2 kg)  06/22/16 (!) 303 lb 12.8 oz (137.8 kg)    Physical Exam  Constitutional: She is oriented to person, place, and time. She appears well-developed and well-nourished. She appears ill.  HENT:  Head: Normocephalic.  Right Ear: Hearing, external ear and ear canal normal. A middle ear effusion is present.  Left Ear: Hearing, external ear and ear canal normal. A middle ear effusion is present.  Nose: Mucosal edema and rhinorrhea present. Right sinus exhibits no maxillary sinus tenderness and no frontal sinus tenderness. Left sinus exhibits no maxillary sinus tenderness and no frontal sinus tenderness.  Mouth/Throat: Mucous membranes are normal.  Posterior oropharyngeal erythema present. No oropharyngeal exudate, posterior oropharyngeal edema or tonsillar abscesses.  Eyes: EOM are normal.  Neck: Normal range of motion. No thyromegaly present.  Cardiovascular: Normal rate and regular rhythm. Exam reveals no gallop and no friction rub.  No murmur heard. Pulmonary/Chest: Effort normal. No respiratory distress. She has no wheezes. She has rhonchi in the right middle field, the right lower field, the left middle field and the left lower field. She has no rales. She exhibits no tenderness.  Abdominal: Soft. Bowel sounds are normal.  Musculoskeletal: Normal range of motion.  Lymphadenopathy:    She has no cervical adenopathy.  Neurological: She is alert and oriented to person, place, and time.  Skin: Skin is warm and dry.  Psychiatric: She has a normal mood and affect. Her behavior is normal. Judgment and thought content normal.      Patient has been counseled extensively about nutrition and exercise as well as the importance of adherence with medications and regular follow-up. The patient was given clear instructions to go to ER or return to medical center if symptoms don't improve, worsen or new problems develop. The patient verbalized understanding.   Follow-up: Return in about 3 months (around 08/21/2017) for needs 30 minutes.   Gildardo Pounds, FNP-BC [facility], [place]   05/25/2017, 9:45 PM

## 2017-05-24 LAB — BASIC METABOLIC PANEL
BUN/Creatinine Ratio: 19 (ref 9–23)
BUN: 14 mg/dL (ref 6–24)
CALCIUM: 9.5 mg/dL (ref 8.7–10.2)
CHLORIDE: 104 mmol/L (ref 96–106)
CO2: 22 mmol/L (ref 20–29)
Creatinine, Ser: 0.72 mg/dL (ref 0.57–1.00)
GFR calc Af Amer: 106 mL/min/{1.73_m2} (ref >59)
GFR calc non Af Amer: 92 mL/min/{1.73_m2} (ref >59)
GLUCOSE: 107 mg/dL — AB (ref 65–99)
Potassium: 4.3 mmol/L (ref 3.5–5.2)
Sodium: 142 mmol/L (ref 134–144)

## 2017-05-24 LAB — CBC
HEMOGLOBIN: 12.6 g/dL (ref 11.1–15.9)
Hematocrit: 39 % (ref 34.0–46.6)
MCH: 27.9 pg (ref 26.6–33.0)
MCHC: 32.3 g/dL (ref 31.5–35.7)
MCV: 87 fL (ref 79–97)
Platelets: 297 10*3/uL (ref 150–379)
RBC: 4.51 x10E6/uL (ref 3.77–5.28)
RDW: 15.8 % — ABNORMAL HIGH (ref 12.3–15.4)
WBC: 9 10*3/uL (ref 3.4–10.8)

## 2017-05-24 LAB — LIPID PANEL
Chol/HDL Ratio: 3.1 ratio (ref 0.0–4.4)
Cholesterol, Total: 171 mg/dL (ref 100–199)
HDL: 55 mg/dL (ref >39)
LDL CALC: 104 mg/dL — AB (ref 0–99)
TRIGLYCERIDES: 62 mg/dL (ref 0–149)
VLDL Cholesterol Cal: 12 mg/dL (ref 5–40)

## 2017-05-25 ENCOUNTER — Encounter: Payer: Self-pay | Admitting: Nurse Practitioner

## 2017-05-26 ENCOUNTER — Telehealth: Payer: Self-pay

## 2017-05-26 NOTE — Telephone Encounter (Signed)
-----   Message from Leah Pounds, NP sent at 05/25/2017 10:02 PM EST ----- Labs are essentially normal. Patient should continue to work on low fat, heart healthy diet and participate in regular aerobic exercise program to control as well.

## 2017-05-27 NOTE — Telephone Encounter (Signed)
-----   Message from Gildardo Pounds, NP sent at 05/25/2017 10:02 PM EST ----- Labs are essentially normal. Patient should continue to work on low fat, heart healthy diet and participate in regular aerobic exercise program to control as well.

## 2017-05-27 NOTE — Telephone Encounter (Signed)
CMA called patient to inform on lab result and PCP advising.   Patient understood and verified DOB.

## 2017-08-03 ENCOUNTER — Telehealth: Payer: Self-pay | Admitting: Nurse Practitioner

## 2017-08-03 DIAGNOSIS — R6889 Other general symptoms and signs: Secondary | ICD-10-CM | POA: Diagnosis not present

## 2017-08-03 NOTE — Telephone Encounter (Signed)
[facility] from [facility] called stating that pt. Needs a referral to be seen b/c she has [employer]. Pt. States she needs to be seen for itching and burning in her feet. Ladona Mow stated that she wound need prior auth in order for the pt. to be seen at the facility. Please f/u

## 2017-08-03 NOTE — Telephone Encounter (Signed)
Route to PCP

## 2017-08-10 DIAGNOSIS — R6889 Other general symptoms and signs: Secondary | ICD-10-CM | POA: Diagnosis not present

## 2017-08-11 ENCOUNTER — Other Ambulatory Visit: Payer: Self-pay | Admitting: Nurse Practitioner

## 2017-08-11 DIAGNOSIS — R208 Other disturbances of skin sensation: Secondary | ICD-10-CM

## 2017-08-11 NOTE — Telephone Encounter (Signed)
Referral has been placed. 

## 2017-08-22 ENCOUNTER — Encounter: Payer: Self-pay | Admitting: Nurse Practitioner

## 2017-08-22 ENCOUNTER — Ambulatory Visit: Payer: Medicare HMO | Attending: Nurse Practitioner | Admitting: Nurse Practitioner

## 2017-08-22 VITALS — BP 155/84 | HR 73 | Temp 98.1°F | Ht 64.0 in | Wt 316.4 lb

## 2017-08-22 DIAGNOSIS — M25561 Pain in right knee: Secondary | ICD-10-CM | POA: Diagnosis not present

## 2017-08-22 DIAGNOSIS — R05 Cough: Secondary | ICD-10-CM | POA: Diagnosis not present

## 2017-08-22 DIAGNOSIS — G8929 Other chronic pain: Secondary | ICD-10-CM | POA: Insufficient documentation

## 2017-08-22 DIAGNOSIS — Z79899 Other long term (current) drug therapy: Secondary | ICD-10-CM | POA: Insufficient documentation

## 2017-08-22 DIAGNOSIS — R6889 Other general symptoms and signs: Secondary | ICD-10-CM | POA: Diagnosis not present

## 2017-08-22 DIAGNOSIS — K219 Gastro-esophageal reflux disease without esophagitis: Secondary | ICD-10-CM | POA: Diagnosis not present

## 2017-08-22 DIAGNOSIS — Z6841 Body Mass Index (BMI) 40.0 and over, adult: Secondary | ICD-10-CM | POA: Insufficient documentation

## 2017-08-22 DIAGNOSIS — Z7982 Long term (current) use of aspirin: Secondary | ICD-10-CM | POA: Insufficient documentation

## 2017-08-22 DIAGNOSIS — Z96652 Presence of left artificial knee joint: Secondary | ICD-10-CM | POA: Insufficient documentation

## 2017-08-22 DIAGNOSIS — I10 Essential (primary) hypertension: Secondary | ICD-10-CM | POA: Insufficient documentation

## 2017-08-22 DIAGNOSIS — R5383 Other fatigue: Secondary | ICD-10-CM | POA: Diagnosis not present

## 2017-08-22 DIAGNOSIS — E782 Mixed hyperlipidemia: Secondary | ICD-10-CM | POA: Insufficient documentation

## 2017-08-22 DIAGNOSIS — R053 Chronic cough: Secondary | ICD-10-CM

## 2017-08-22 DIAGNOSIS — Z7951 Long term (current) use of inhaled steroids: Secondary | ICD-10-CM | POA: Diagnosis not present

## 2017-08-22 DIAGNOSIS — Z8249 Family history of ischemic heart disease and other diseases of the circulatory system: Secondary | ICD-10-CM | POA: Insufficient documentation

## 2017-08-22 DIAGNOSIS — M1711 Unilateral primary osteoarthritis, right knee: Secondary | ICD-10-CM | POA: Diagnosis not present

## 2017-08-22 DIAGNOSIS — Z885 Allergy status to narcotic agent status: Secondary | ICD-10-CM | POA: Insufficient documentation

## 2017-08-22 DIAGNOSIS — Z882 Allergy status to sulfonamides status: Secondary | ICD-10-CM | POA: Insufficient documentation

## 2017-08-22 DIAGNOSIS — E669 Obesity, unspecified: Secondary | ICD-10-CM | POA: Insufficient documentation

## 2017-08-22 MED ORDER — PRAVASTATIN SODIUM 40 MG PO TABS: 40.0000 mg | ORAL_TABLET | Freq: Every day | ORAL | 0 refills | Status: DC

## 2017-08-22 MED ORDER — NAPROXEN 500 MG PO TABS: 500.0000 mg | ORAL_TABLET | Freq: Two times a day (BID) | ORAL | 1 refills | Status: DC

## 2017-08-22 MED ORDER — OMEPRAZOLE 20 MG PO CPDR: 20.0000 mg | DELAYED_RELEASE_CAPSULE | Freq: Every day | ORAL | 0 refills | Status: DC

## 2017-08-22 MED ORDER — ALBUTEROL SULFATE HFA 108 (90 BASE) MCG/ACT IN AERS: 2.0000 | INHALATION_SPRAY | Freq: Four times a day (QID) | RESPIRATORY_TRACT | 5 refills | Status: DC | PRN

## 2017-08-22 MED ORDER — LOSARTAN POTASSIUM-HCTZ 100-25 MG PO TABS: 1.0000 | ORAL_TABLET | Freq: Every day | ORAL | 3 refills | Status: DC

## 2017-08-22 NOTE — Patient Instructions (Addendum)
You can try a B Complex vitmain daily for more energy   Knee Pain, Adult Many things can cause knee pain. The pain often goes away on its own with time and rest. If the pain does not go away, tests may be done to find out what is causing the pain. Follow these instructions at home: Activity  Rest your knee.  Do not do things that cause pain.  Avoid activities where both feet leave the ground at the same time (high-impact activities). Examples are running, jumping rope, and doing jumping jacks. General instructions  Take medicines only as told by your doctor.  Raise (elevate) your knee when you are resting. Make sure your knee is higher than your heart.  Sleep with a pillow under your knee.  If told, put ice on the knee: ? Put ice in a plastic bag. ? Place a towel between your skin and the bag. ? Leave the ice on for 20 minutes, 2-3 times a day.  Ask your doctor if you should wear an elastic knee support.  Lose weight if you are overweight. Being overweight can make your knee hurt more.  Do not use any tobacco products. These include cigarettes, chewing tobacco, or electronic cigarettes. If you need help quitting, ask your doctor. Smoking may slow down healing. Contact a doctor if:  The pain does not stop.  The pain changes or gets worse.  You have a fever along with knee pain.  Your knee gives out or locks up.  Your knee swells, and becomes worse. Get help right away if:  Your knee feels warm.  You cannot move your knee.  You have very bad knee pain.  You have chest pain.  You have trouble breathing. Summary  Many things can cause knee pain. The pain often goes away on its own with time and rest.  Avoid activities that put stress on your knee. These include running and jumping rope.  Get help right away if you cannot move your knee, or if your knee feels warm, or if you have trouble breathing. This information is not intended to replace advice given to you  by your health care provider. Make sure you discuss any questions you have with your health care provider. Document Released: 07/30/2008 Document Revised: 04/27/2016 Document Reviewed: 04/27/2016 Elsevier Interactive Patient Education  2017 [employer].

## 2017-08-22 NOTE — Progress Notes (Signed)
Assessment & Plan:  Leah Hampton was seen today for follow-up and medication refill.  Diagnoses and all orders for this visit:  Essential hypertension -     losartan-hydrochlorothiazide (HYZAAR) 100-25 MG tablet; Take 1 tablet by mouth daily. Continue all antihypertensives as prescribed.  Remember to bring in your blood pressure log with you for your follow up appointment.  DASH/Mediterranean Diets are healthier choices for HTN.    Mixed hyperlipidemia -     pravastatin (PRAVACHOL) 40 MG tablet; Take 1 tablet (40 mg total) by mouth daily. INSTRUCTIONS: Work on a low fat, heart healthy diet and participate in regular aerobic exercise program by working out at least 150 minutes per week. No fried foods. No junk foods, sodas, sugary drinks, unhealthy snacking, alcohol or smoking.    Gastroesophageal reflux disease, esophagitis presence not specified -     omeprazole (PRILOSEC) 20 MG capsule; Take 1 capsule (20 mg total) by mouth daily. INSTRUCTIONS: Avoid GERD Triggers: acidic, spicy or fried foods, caffeine, coffee, sodas,  alcohol and chocolate.   Chronic cough -     albuterol (PROVENTIL HFA;VENTOLIN HFA) 108 (90 Base) MCG/ACT inhaler; Inhale 2 puffs into the lungs every 6 (six) hours as needed for wheezing or shortness of breath.  Other fatigue -     Vitamin B12 -     VITAMIN D 25 Hydroxy (Vit-D Deficiency, Fractures) -     TSH  Chronic pain of right knee -     naproxen (NAPROSYN) 500 MG tablet; Take 1 tablet (500 mg total) by mouth 2 (two) times daily with a meal. Work on losing weight to help reduce knee  pain. May alternate with heat and ice application for pain relief. May also alternate with acetaminophen  as prescribed for pain. Other alternatives include massage, acupuncture and water aerobics.  You must stay active and avoid a sedentary lifestyle.      Patient has been counseled on age-appropriate routine health concerns for screening and prevention. These are reviewed and  up-to-date. Referrals have been placed accordingly. Immunizations are up-to-date or declined.    Subjective:   Chief Complaint  Patient presents with  . Follow-up    Pt is here for a 3 months follow-up.   . Medication Refill   HPI Leah Hampton 62 y.o. female presents to office today to establish care.    Fatigue Ongoing for over a year. She denies any symptoms of OSA and states she was recently tested for OSA and sleep study was normal. She sleeps at least 6-8 hours at night. SHe does not exercise and has not tried any OTC vitamins.   Essential Hypertension Chronic. Poorly controlled. She endorses medication compliance however she is not diet compliant. Denies chest pain, shortness of breath, palpitations, lightheadedness, dizziness, headaches or visual disturbances.  Will stop cozaar and switch to hyzaar today.  BP Readings from Last 3 Encounters:  08/22/17 (!) 155/84  05/23/17 (!) 143/86  01/05/17 (!) 158/85    Right knee pain and swelling   She had left total knee replacement in 2010 and has chronic primary OA of right knee. Treatments and medications have included cortisone injection, tramadol and 600mg  ibuprofen, voltaren gel and Norco (which has provided the greatest relief). Her main contributing factor is also her weight. BMI >50.    Hyperlipidemia Patient presents for follow up to hyperlipidemia.  She is medication compliant. She is not diet compliant and denies chest pain, dyspnea, exertional chest pressure/discomfort, fatigue, lower extremity edema, poor exercise tolerance  and tachypnea or statin intolerance including myalgias.  Lab Results  Component Value Date   CHOL 171 05/23/2017   Lab Results  Component Value Date   HDL 55 05/23/2017   Lab Results  Component Value Date   LDLCALC 104 (H) 05/23/2017   Lab Results  Component Value Date   TRIG 62 05/23/2017   Lab Results  Component Value Date   CHOLHDL 3.1 05/23/2017    Review of Systems    Constitutional: Positive for malaise/fatigue. Negative for fever and weight loss.  HENT: Negative.  Negative for nosebleeds.   Eyes: Negative.  Negative for blurred vision, double vision and photophobia.  Respiratory: Positive for cough. Negative for shortness of breath.   Cardiovascular: Positive for leg swelling. Negative for chest pain and palpitations.  Gastrointestinal: Positive for heartburn. Negative for nausea and vomiting.  Genitourinary: Negative.   Musculoskeletal: Positive for joint pain. Negative for myalgias.       SEE HPI  Neurological: Negative.  Negative for dizziness, focal weakness, seizures and headaches.  Psychiatric/Behavioral: Negative.  Negative for suicidal ideas.    Past Medical History:  Diagnosis Date  . Bronchitis   . Cystitis   . Hematuria    urologice eval, Dr Estill Dooms  . History of chest pain   . Hyperbilirubinemia   . Hyperlipemia   . Hypertelorism   . Hypertension   . Leukocytosis   . Obesity   . Osteoarthritis    right knee  . Sleep apnea 2012   slight     Past Surgical History:  Procedure Laterality Date  . COLONOSCOPY WITH PROPOFOL N/A 03/27/2015   Procedure: COLONOSCOPY WITH PROPOFOL;  Surgeon: Milus Banister, MD;  Location: [facility];  Service: Endoscopy;  Laterality: N/A;  . DILATATION & CURETTAGE/HYSTEROSCOPY WITH MYOSURE N/A 01/05/2017   Procedure: DILATATION & CURETTAGE/HYSTEROSCOPY WITH MYOSURE;  Surgeon: Thurnell Lose, MD;  Location: [facility];  Service: Gynecology;  Laterality: N/A;  PostMenopausal Bleeding  . KNEE ARTHROSCOPY  04/2007   left knee  . TOTAL KNEE ARTHROPLASTY  10/2008   Dr Roxine Caddy    Family History  Problem Relation Age of Onset  . Heart failure Mother   . Heart attack Father 53  . Diabetes Unknown   . Arthritis Unknown   . Stroke Unknown   . Coronary artery disease Unknown   . Heart failure Sister        x 2  . Colon cancer Neg Hx     Social History Reviewed with no changes to be made today.    Outpatient Medications Prior to Visit  Medication Sig Dispense Refill  . fluticasone (FLOVENT HFA) 110 MCG/ACT inhaler Inhale 2 puffs into the lungs 2 (two) times daily. 12 g 5  . albuterol (PROVENTIL HFA;VENTOLIN HFA) 108 (90 Base) MCG/ACT inhaler Inhale 2 puffs into the lungs every 6 (six) hours as needed for wheezing or shortness of breath. 3 Inhaler 5  . ibuprofen (ADVIL,MOTRIN) 800 MG tablet Take 1 tablet (800 mg total) by mouth every 8 (eight) hours as needed. 10 tablet 0  . losartan (COZAAR) 50 MG tablet Take 1 tablet (50 mg total) by mouth daily. 90 tablet 0  . omeprazole (PRILOSEC) 20 MG capsule Take 1 capsule (20 mg total) by mouth daily. 90 capsule 0  . pravastatin (PRAVACHOL) 40 MG tablet Take 1 tablet (40 mg total) by mouth daily. 90 tablet 0  . aspirin EC 81 MG tablet Take 81 mg by mouth daily.    Marland Kitchen azithromycin (ZITHROMAX)  250 MG tablet Take 2 tablets on day one and then take 1 tablet on days 2-5. 6 tablet 0   No facility-administered medications prior to visit.     Allergies  Allergen Reactions  . Sulfamethoxazole-Trimethoprim [Bactrim] Itching  . Codeine Nausea And Vomiting and Anxiety       Objective:    BP (!) 155/84 (BP Location: Left Arm, Patient Position: Sitting, Cuff Size: Large)   Pulse 73   Temp 98.1 F (36.7 C) (Oral)   Ht 5\' 4"  (1.626 m)   Wt (!) 316 lb 6.4 oz (143.5 kg)   LMP 11/10/2011   SpO2 93%   BMI 54.31 kg/m  Wt Readings from Last 3 Encounters:  08/22/17 (!) 316 lb 6.4 oz (143.5 kg)  05/23/17 (!) 313 lb (142 kg)  12/27/16 (!) 300 lb 4 oz (136.2 kg)    Physical Exam  Constitutional: She is oriented to person, place, and time. She appears well-developed and well-nourished. She is cooperative.  HENT:  Head: Normocephalic and atraumatic.  Eyes: EOM are normal.  Neck: Normal range of motion.  Cardiovascular: Normal rate, regular rhythm and normal heart sounds. Exam reveals no gallop and no friction rub.  No murmur  heard. Pulmonary/Chest: Effort normal and breath sounds normal. No tachypnea. No respiratory distress. She has no decreased breath sounds. She has no wheezes. She has no rhonchi. She has no rales. She exhibits no tenderness.  Abdominal: Soft. Bowel sounds are normal.  Musculoskeletal: Normal range of motion. She exhibits no edema.       Right knee: She exhibits swelling.  Neurological: She is alert and oriented to person, place, and time. Coordination normal.  Skin: Skin is warm and dry.  Psychiatric: She has a normal mood and affect. Her behavior is normal. Judgment and thought content normal.  Nursing note and vitals reviewed.     Patient has been counseled extensively about nutrition and exercise as well as the importance of adherence with medications and regular follow-up. The patient was given clear instructions to go to ER or return to medical center if symptoms don't improve, worsen or new problems develop. The patient verbalized understanding.   Follow-up: Return in about 1 month (around 09/19/2017) for BP recheck.   Gildardo Pounds, FNP-BC [facility], [place]   08/23/2017, 7:01 PM

## 2017-08-23 ENCOUNTER — Encounter: Payer: Self-pay | Admitting: Nurse Practitioner

## 2017-08-23 LAB — TSH: TSH: 2.13 u[IU]/mL (ref 0.450–4.500)

## 2017-08-23 LAB — VITAMIN B12: Vitamin B-12: 698 pg/mL (ref 232–1245)

## 2017-08-23 LAB — VITAMIN D 25 HYDROXY (VIT D DEFICIENCY, FRACTURES): Vit D, 25-Hydroxy: 23.8 ng/mL — ABNORMAL LOW (ref 30.0–100.0)

## 2017-08-24 ENCOUNTER — Telehealth: Payer: Self-pay

## 2017-08-24 NOTE — Telephone Encounter (Signed)
CMA spoke to patient to inform on lab results.  Patient understood, No concerns.

## 2017-08-24 NOTE — Telephone Encounter (Signed)
-----   Message from Gildardo Pounds, NP sent at 08/23/2017  7:14 PM EDT ----- Thyroid and B12 are normal.  Vitamin D is slightly below normal. You can take OTC vitamin D [phone] units daily. INSTRUCTIONS: Work on a low fat, heart healthy diet and participate in regular aerobic exercise program by working out at least 150 minutes per week. No fried foods. No junk foods, sodas, sugary drinks, unhealthy snacking, alcohol or smoking.

## 2017-08-26 ENCOUNTER — Ambulatory Visit: Payer: Self-pay | Admitting: Podiatry

## 2017-09-15 ENCOUNTER — Ambulatory Visit: Payer: Medicare HMO | Admitting: Podiatry

## 2017-09-15 ENCOUNTER — Telehealth: Payer: Self-pay | Admitting: Podiatry

## 2017-09-15 DIAGNOSIS — B353 Tinea pedis: Secondary | ICD-10-CM | POA: Diagnosis not present

## 2017-09-15 DIAGNOSIS — Z79899 Other long term (current) drug therapy: Secondary | ICD-10-CM | POA: Diagnosis not present

## 2017-09-15 DIAGNOSIS — B351 Tinea unguium: Secondary | ICD-10-CM

## 2017-09-15 MED ORDER — KETOCONAZOLE 2 % EX CREA: 1.0000 "application " | TOPICAL_CREAM | Freq: Every day | CUTANEOUS | 2 refills | Status: DC

## 2017-09-15 MED FILL — KETOCONAZOLE 2% CREAM: 2 | 30 days supply | Qty: 60 | Fill #0

## 2017-09-15 NOTE — Telephone Encounter (Signed)
Spoke with patient and sent over to the corrected pharmacy and called the patient back. Leah Hampton

## 2017-09-15 NOTE — Patient Instructions (Addendum)
Terbinafine oral granules What is this medicine? TERBINAFINE (TER bin a feen) is an antifungal medicine. It is used to treat certain kinds of fungal or yeast infections. This medicine may be used for other purposes; ask your health care provider or pharmacist if you have questions. COMMON BRAND NAME(S): Lamisil What should I tell my health care provider before I take this medicine? They need to know if you have any of these conditions: -drink alcoholic beverages -kidney disease -liver disease -an unusual or allergic reaction to Terbinafine, other medicines, foods, dyes, or preservatives -pregnant or trying to get pregnant -breast-feeding How should I use this medicine? Take this medicine by mouth. Follow the directions on the prescription label. Hold packet with cut line on top. Shake packet gently to settle contents. Tear packet open along cut line, or use scissors to cut across line. Carefully pour the entire contents of packet onto a spoonful of a soft food, such as pudding or other soft, non-acidic food such as mashed potatoes (do NOT use applesauce or a fruit-based food). If two packets are required for each dose, you may either sprinkle the content of both packets on one spoonful of non-acidic food, or sprinkle the contents of both packets on two spoonfuls of non-acidic food. Make sure that no granules remain in the packet. Swallow the mxiture of the food and granules without chewing. Take your medicine at regular intervals. Do not take it more often than directed. Take all of your medicine as directed even if you think you are better. Do not skip doses or stop your medicine early. Contact your pediatrician or health care professional regarding the use of this medicine in children. While this medicine may be prescribed for children as young as 4 years for selected conditions, precautions do apply. Overdosage: If you think you have taken too much of this medicine contact a poison control center  or emergency room at once. NOTE: This medicine is only for you. Do not share this medicine with others. What if I miss a dose? If you miss a dose, take it as soon as you can. If it is almost time for your next dose, take only that dose. Do not take double or extra doses. What may interact with this medicine? Do not take this medicine with any of the following medications: -thioridazine This medicine may also interact with the following medications: -beta-blockers -caffeine -cimetidine -cyclosporine -MAOIs like Carbex, Eldepryl, Marplan, Nardil, and Parnate -medicines for fungal infections like fluconazole and ketoconazole -medicines for irregular heartbeat like amiodarone, flecainide and propafenone -rifampin -SSRIs like citalopram, escitalopram, fluoxetine, fluvoxamine, paroxetine and sertraline -tricyclic antidepressants like amitriptyline, clomipramine, desipramine, imipramine, nortriptyline, and others -warfarin This list may not describe all possible interactions. Give your health care provider a list of all the medicines, herbs, non-prescription drugs, or dietary supplements you use. Also tell them if you smoke, drink alcohol, or use illegal drugs. Some items may interact with your medicine. What should I watch for while using this medicine? Your doctor may monitor your liver function. Tell your doctor right away if you have nausea or vomiting, loss of appetite, stomach pain on your right upper side, yellow skin, dark urine, light stools, or are over tired. You need to take this medicine for 6 weeks or longer to cure the fungal infection. Take your medicine regularly for as long as your doctor or health care professional tells you to. What side effects may I notice from receiving this medicine? Side effects that you should report   to your doctor or health care professional as soon as possible: -allergic reactions like skin rash or hives, swelling of the face, lips, or tongue -change in  vision -dark urine -fever or infection -general ill feeling or flu-like symptoms -light-colored stools -loss of appetite, nausea -redness, blistering, peeling or loosening of the skin, including inside the mouth -right upper belly pain -unusually weak or tired -yellowing of the eyes or skin Side effects that usually do not require medical attention (report to your doctor or health care professional if they continue or are bothersome): -changes in taste -diarrhea -hair loss -muscle or joint pain -stomach upset This list may not describe all possible side effects. Call your doctor for medical advice about side effects. You may report side effects to FDA at 1-800-FDA-1088. Where should I keep my medicine? Keep out of the reach of children. Store at room temperature between 15 and 30 degrees C (59 and 86 degrees F). Throw away any unused medicine after the expiration date. NOTE: This sheet is a summary. It may not cover all possible information. If you have questions about this medicine, talk to your doctor, pharmacist, or health care provider.  2018 Elsevier/Gold Standard (2007-07-14 17:25:48)   Onychomycosis/Fungal Toenails  WHAT IS IT? An infection that lies within the keratin of your nail plate that is caused by a fungus.  WHY ME? Fungal infections affect all ages, sexes, races, and creeds.  There may be many factors that predispose you to a fungal infection such as age, coexisting medical conditions such as diabetes, or an autoimmune disease; stress, medications, fatigue, genetics, etc.  Bottom line: fungus thrives in a warm, moist environment and your shoes offer such a location.  IS IT CONTAGIOUS? Theoretically, yes.  You do not want to share shoes, nail clippers or files with someone who has fungal toenails.  Walking around barefoot in the same room or sleeping in the same bed is unlikely to transfer the organism.  It is important to realize, however, that fungus can spread easily  from one nail to the next on the same foot.  HOW DO WE TREAT THIS?  There are several ways to treat this condition.  Treatment may depend on many factors such as age, medications, pregnancy, liver and kidney conditions, etc.  It is best to ask your doctor which options are available to you.  1. No treatment.   Unlike many other medical concerns, you can live with this condition.  However for many people this can be a painful condition and may lead to ingrown toenails or a bacterial infection.  It is recommended that you keep the nails cut short to help reduce the amount of fungal nail. 2. Topical treatment.  These range from herbal remedies to prescription strength nail lacquers.  About 40-50% effective, topicals require twice daily application for approximately 9 to 12 months or until an entirely new nail has grown out.  The most effective topicals are medical grade medications available through physicians offices. 3. Oral antifungal medications.  With an 80-90% cure rate, the most common oral medication requires 3 to 4 months of therapy and stays in your system for a year as the new nail grows out.  Oral antifungal medications do require blood work to make sure it is a safe drug for you.  A liver function panel will be performed prior to starting the medication and after the first month of treatment.  It is important to have the blood work performed to avoid any harmful  side effects.  In general, this medication safe but blood work is required. 4. Laser Therapy.  This treatment is performed by applying a specialized laser to the affected nail plate.  This therapy is noninvasive, fast, and non-painful.  It is not covered by insurance and is therefore, out of pocket.  The results have been very good with a 80-95% cure rate.  [facility] is the only practice in the area to offer this therapy. 5. Permanent Nail Avulsion.  Removing the entire nail so that a new nail will not grow back.

## 2017-09-15 NOTE — Telephone Encounter (Signed)
Patient needs medication that was sent to [facility] sent to [facility] on [address]. If you can call patient back to let her know that its been sent at [phone]

## 2017-09-19 DIAGNOSIS — B353 Tinea pedis: Secondary | ICD-10-CM | POA: Insufficient documentation

## 2017-09-19 DIAGNOSIS — B351 Tinea unguium: Secondary | ICD-10-CM | POA: Insufficient documentation

## 2017-09-19 NOTE — Progress Notes (Signed)
Subjective:   Patient ID: Leah Hampton, female   DOB: 62 y.o.   MRN: [id number]   HPI 62 year old female presents the office today for concerns of itching to both of her feet.  She states that she has tried numerous over-the-counter treatments and nothing is been helping.  She does state that sometimes she gets it worse at nighttime.  She denies any cracks in skin and she denies any drainage or swelling or any open sores.  She has no other concerns to her feet.   Review of Systems  All other systems reviewed and are negative.   Past Medical History:  Diagnosis Date  . Bronchitis   . Cystitis   . Hematuria    urologice eval, Dr Estill Dooms  . History of chest pain   . Hyperbilirubinemia   . Hyperlipemia   . Hypertelorism   . Hypertension   . Leukocytosis   . Obesity   . Osteoarthritis    right knee  . Sleep apnea 2012   slight     Past Surgical History:  Procedure Laterality Date  . COLONOSCOPY WITH PROPOFOL N/A 03/27/2015   Procedure: COLONOSCOPY WITH PROPOFOL;  Surgeon: Milus Banister, MD;  Location: [facility];  Service: Endoscopy;  Laterality: N/A;  . DILATATION & CURETTAGE/HYSTEROSCOPY WITH MYOSURE N/A 01/05/2017   Procedure: DILATATION & CURETTAGE/HYSTEROSCOPY WITH MYOSURE;  Surgeon: Thurnell Lose, MD;  Location: [facility];  Service: Gynecology;  Laterality: N/A;  PostMenopausal Bleeding  . KNEE ARTHROSCOPY  04/2007   left knee  . TOTAL KNEE ARTHROPLASTY  10/2008   Dr Roxine Caddy     Current Outpatient Medications:  .  albuterol (PROVENTIL HFA;VENTOLIN HFA) 108 (90 Base) MCG/ACT inhaler, Inhale 2 puffs into the lungs every 6 (six) hours as needed for wheezing or shortness of breath., Disp: 3 Inhaler, Rfl: 5 .  aspirin EC 81 MG tablet, Take 81 mg by mouth daily., Disp: , Rfl:  .  fluticasone (FLOVENT HFA) 110 MCG/ACT inhaler, Inhale 2 puffs into the lungs 2 (two) times daily., Disp: 12 g, Rfl: 5 .  ketoconazole (NIZORAL) 2 % cream, Apply 1 application topically daily.,  Disp: 60 g, Rfl: 2 .  losartan-hydrochlorothiazide (HYZAAR) 100-25 MG tablet, Take 1 tablet by mouth daily., Disp: 90 tablet, Rfl: 3 .  naproxen (NAPROSYN) 500 MG tablet, Take 1 tablet (500 mg total) by mouth 2 (two) times daily with a meal., Disp: 30 tablet, Rfl: 1 .  omeprazole (PRILOSEC) 20 MG capsule, Take 1 capsule (20 mg total) by mouth daily., Disp: 90 capsule, Rfl: 0 .  pravastatin (PRAVACHOL) 40 MG tablet, Take 1 tablet (40 mg total) by mouth daily., Disp: 90 tablet, Rfl: 0  Allergies  Allergen Reactions  . Sulfamethoxazole-Trimethoprim [Bactrim] Itching  . Codeine Nausea And Vomiting and Anxiety    Social History   Socioeconomic History  . Marital status: Single    Spouse name: Not on file  . Number of children: 2  . Years of education: Not on file  . Highest education level: Not on file  Occupational History  . Occupation: Training and development officer    Comment: at [employer]  . Financial resource strain: Not on file  . Food insecurity:    Worry: Not on file    Inability: Not on file  . Transportation needs:    Medical: Not on file    Non-medical: Not on file  Tobacco Use  . Smoking status: Never Smoker  . Smokeless tobacco: Never Used  Substance and Sexual Activity  . Alcohol use: No  . Drug use: No  . Sexual activity: Not on file  Lifestyle  . Physical activity:    Days per week: Not on file    Minutes per session: Not on file  . Stress: Not on file  Relationships  . Social connections:    Talks on phone: Not on file    Gets together: Not on file    Attends religious service: Not on file    Active member of club or organization: Not on file    Attends meetings of clubs or organizations: Not on file    Relationship status: Not on file  . Intimate partner violence:    Fear of current or ex partner: Not on file    Emotionally abused: Not on file    Physically abused: Not on file    Forced sexual activity: Not on file  Other Topics Concern  .  Not on file  Social History Narrative   Regular exercise:  No               Objective:  Physical Exam  General: AAO x3, NAD  Dermatological: Nails appear to be hypertrophic, dystrophic, discolored with ill-defined discoloration.  There is also dry skin present in the feet and there is evidence of tinea pedis interdigitally.  There is no open sores identified any drainage or any pustules.  Vascular: Dorsalis Pedis artery and Posterior Tibial artery pedal pulses are 2/4 bilateral with immedate capillary fill time.There is no pain with calf compression, swelling, warmth, erythema.   Neruologic: Grossly intact via light touch bilateral. Protective threshold with Semmes Wienstein monofilament intact to all pedal sites bilateral.  Musculoskeletal: No gross boney pedal deformities bilateral. No pain, crepitus, or limitation noted with foot and ankle range of motion bilateral. Muscular strength 5/5 in all groups tested bilateral.  Gait: Unassisted, Nonantalgic.       Assessment:   Onychomycosis, tinea pedis    Plan:  -Treatment options discussed including all alternatives, risks, and complications -Etiology of symptoms were discussed -No evidence of acute fracture or stress fracture.  Declines steroid injection we discussed her treatment options of the fungus.  Given the nail fungus as well as a skin fungus we discussed various treatment options and we discussed osteophyte in 6 weeks after the medication starts or sooner if needed.Oral Lamisil as well as other options.  After discussion she wished to proceed with Lamisil.  Discussed side effects of medications well success rates.  We will check a CBC and LFT prior to ordering the medication.    Trula Slade DPM

## 2017-09-21 ENCOUNTER — Other Ambulatory Visit: Payer: Self-pay

## 2017-09-21 ENCOUNTER — Ambulatory Visit: Payer: Medicare HMO | Admitting: Nurse Practitioner

## 2017-09-21 ENCOUNTER — Ambulatory Visit (INDEPENDENT_AMBULATORY_CARE_PROVIDER_SITE_OTHER): Payer: Medicare HMO | Admitting: Nurse Practitioner

## 2017-09-21 ENCOUNTER — Encounter (INDEPENDENT_AMBULATORY_CARE_PROVIDER_SITE_OTHER): Payer: Self-pay | Admitting: Nurse Practitioner

## 2017-09-21 VITALS — BP 134/83 | HR 85 | Temp 97.9°F | Ht 64.0 in | Wt 309.6 lb

## 2017-09-21 DIAGNOSIS — I10 Essential (primary) hypertension: Secondary | ICD-10-CM | POA: Diagnosis not present

## 2017-09-21 NOTE — Progress Notes (Signed)
Assessment & Plan:  Leah Hampton was seen today for follow-up.  Diagnoses and all orders for this visit:  Essential hypertension Continue Hyzaar as prescribed. Denies chest pain, shortness of breath, palpitations, lightheadedness, dizziness, headaches or BLE edema.    Morbid obesity ([place]) Discussed diet and exercise for person with BMI >25. Instructed: You must burn more calories than you eat. Losing 5 percent of your body weight should be considered a success. In the longer term, losing more than 15 percent of your body weight and staying at this weight is an extremely good result. However, keep in mind that even losing 5 percent of your body weight leads to important health benefits, so try not to get discouraged if you're not able to lose more than this. Will recheck weight in 3-6 months.  Patient has been counseled on age-appropriate routine health concerns for screening and prevention. These are reviewed and up-to-date. Referrals have been placed accordingly. Immunizations are up-to-date or declined.    Subjective:   Chief Complaint  Patient presents with  . Follow-up    BP recheck   HPI Leah Hampton 62 y.o. female presents to office today for blood pressure recheck.    ESSENTIAL HYPERTENSION  At her last office visit with me on  08/22/2017 her blood pressure was significantly elevated and Cozaar was switched to Hyzaar 100-25 mg daily.  Today blood pressure is well controlled and patient endorses weight loss.  She has made a few dietary changes however she is still not currently participating in any formal exercise routine. Denies chest pain, shortness of breath, palpitations, lightheadedness, dizziness, headaches or BLE edema.  BP Readings from Last 3 Encounters:  09/21/17 134/83  08/22/17 (!) 155/84  05/23/17 (!) 143/86   Review of Systems  Constitutional: Negative for fever, malaise/fatigue and weight loss.  HENT: Negative.  Negative for nosebleeds.   Eyes: Negative.   Negative for blurred vision, double vision and photophobia.  Respiratory: Negative.  Negative for cough and shortness of breath.   Cardiovascular: Negative.  Negative for chest pain, palpitations and leg swelling.  Gastrointestinal: Negative.  Negative for heartburn, nausea and vomiting.  Musculoskeletal: Positive for joint pain (bilateral knees). Negative for back pain and myalgias.  Neurological: Negative.  Negative for dizziness, focal weakness, seizures and headaches.  Psychiatric/Behavioral: Negative.  Negative for suicidal ideas.    Past Medical History:  Diagnosis Date  . Bronchitis   . Cystitis   . Hematuria    urologice eval, Dr Estill Dooms  . History of chest pain   . Hyperbilirubinemia   . Hyperlipemia   . Hypertelorism   . Hypertension   . Leukocytosis   . Obesity   . Osteoarthritis    right knee  . Sleep apnea 2012   slight     Past Surgical History:  Procedure Laterality Date  . COLONOSCOPY WITH PROPOFOL N/A 03/27/2015   Procedure: COLONOSCOPY WITH PROPOFOL;  Surgeon: Milus Banister, MD;  Location: [facility];  Service: Endoscopy;  Laterality: N/A;  . DILATATION & CURETTAGE/HYSTEROSCOPY WITH MYOSURE N/A 01/05/2017   Procedure: DILATATION & CURETTAGE/HYSTEROSCOPY WITH MYOSURE;  Surgeon: Thurnell Lose, MD;  Location: [facility];  Service: Gynecology;  Laterality: N/A;  PostMenopausal Bleeding  . KNEE ARTHROSCOPY  04/2007   left knee  . TOTAL KNEE ARTHROPLASTY  10/2008   Dr Roxine Caddy    Family History  Problem Relation Age of Onset  . Heart failure Mother   . Heart attack Father 39  . Diabetes Unknown   .  Arthritis Unknown   . Stroke Unknown   . Coronary artery disease Unknown   . Heart failure Sister        x 2  . Colon cancer Neg Hx     Social History Reviewed with no changes to be made today.   Outpatient Medications Prior to Visit  Medication Sig Dispense Refill  . albuterol (PROVENTIL HFA;VENTOLIN HFA) 108 (90 Base) MCG/ACT inhaler Inhale 2 puffs into  the lungs every 6 (six) hours as needed for wheezing or shortness of breath. 3 Inhaler 5  . fluticasone (FLOVENT HFA) 110 MCG/ACT inhaler Inhale 2 puffs into the lungs 2 (two) times daily. 12 g 5  . ketoconazole (NIZORAL) 2 % cream Apply 1 application topically daily. 60 g 2  . losartan-hydrochlorothiazide (HYZAAR) 100-25 MG tablet Take 1 tablet by mouth daily. 90 tablet 3  . naproxen (NAPROSYN) 500 MG tablet Take 1 tablet (500 mg total) by mouth 2 (two) times daily with a meal. 30 tablet 1  . omeprazole (PRILOSEC) 20 MG capsule Take 1 capsule (20 mg total) by mouth daily. 90 capsule 0  . pravastatin (PRAVACHOL) 40 MG tablet Take 1 tablet (40 mg total) by mouth daily. 90 tablet 0  . aspirin EC 81 MG tablet Take 81 mg by mouth daily.     No facility-administered medications prior to visit.     Allergies  Allergen Reactions  . Sulfamethoxazole-Trimethoprim [Bactrim] Itching  . Codeine Nausea And Vomiting and Anxiety       Objective:    BP 134/83 (BP Location: Left Arm, Patient Position: Sitting, Cuff Size: Large)   Pulse 85   Temp 97.9 F (36.6 C) (Oral)   Ht 5\' 4"  (1.626 m)   Wt (!) 309 lb 9.6 oz (140.4 kg)   LMP 11/10/2011   SpO2 96%   BMI 53.14 kg/m  Wt Readings from Last 3 Encounters:  09/21/17 (!) 309 lb 9.6 oz (140.4 kg)  08/22/17 (!) 316 lb 6.4 oz (143.5 kg)  05/23/17 (!) 313 lb (142 kg)    Physical Exam  Constitutional: She is oriented to person, place, and time. She appears well-developed and well-nourished. She is cooperative.  HENT:  Head: Normocephalic and atraumatic.  Eyes: EOM are normal.  Neck: Normal range of motion.  Cardiovascular: Normal rate, regular rhythm and normal heart sounds. Exam reveals no gallop and no friction rub.  No murmur heard. Pulmonary/Chest: Effort normal and breath sounds normal. No tachypnea. No respiratory distress. She has no decreased breath sounds. She has no wheezes. She has no rhonchi. She has no rales. She exhibits no  tenderness.  Abdominal: Soft. Bowel sounds are normal.  Musculoskeletal: Normal range of motion. She exhibits no edema.  Neurological: She is alert and oriented to person, place, and time. Coordination normal.  Skin: Skin is warm and dry.  Psychiatric: She has a normal mood and affect. Her behavior is normal. Judgment and thought content normal.  Nursing note and vitals reviewed.      Patient has been counseled extensively about nutrition and exercise as well as the importance of adherence with medications and regular follow-up. The patient was given clear instructions to go to ER or return to medical center if symptoms don't improve, worsen or new problems develop. The patient verbalized understanding.   Follow-up: Return in about 3 months (around 12/22/2017) for HTN.   Gildardo Pounds, FNP-BC [facility] [place], [place]   09/21/2017, 10:18 AM

## 2017-09-21 NOTE — Patient Instructions (Signed)
DASH Eating Plan DASH stands for "Dietary Approaches to Stop Hypertension." The DASH eating plan is a healthy eating plan that has been shown to reduce high blood pressure (hypertension). It may also reduce your risk for type 2 diabetes, heart disease, and stroke. The DASH eating plan may also help with weight loss. What are tips for following this plan? General guidelines  Avoid eating more than 2,300 mg (milligrams) of salt (sodium) a day. If you have hypertension, you may need to reduce your sodium intake to 1,500 mg a day.  Limit alcohol intake to no more than 1 drink a day for nonpregnant women and 2 drinks a day for men. One drink equals 12 oz of beer, 5 oz of wine, or 1 oz of hard liquor.  Work with your health care provider to maintain a healthy body weight or to lose weight. Ask what an ideal weight is for you.  Get at least 30 minutes of exercise that causes your heart to beat faster (aerobic exercise) most days of the week. Activities may include walking, swimming, or biking.  Work with your health care provider or diet and nutrition specialist (dietitian) to adjust your eating plan to your individual calorie needs. Reading food labels  Check food labels for the amount of sodium per serving. Choose foods with less than 5 percent of the Daily Value of sodium. Generally, foods with less than 300 mg of sodium per serving fit into this eating plan.  To find whole grains, look for the word "whole" as the first word in the ingredient list. Shopping  Buy products labeled as "low-sodium" or "no salt added."  Buy fresh foods. Avoid canned foods and premade or frozen meals. Cooking  Avoid adding salt when cooking. Use salt-free seasonings or herbs instead of table salt or sea salt. Check with your health care provider or pharmacist before using salt substitutes.  Do not fry foods. Cook foods using healthy methods such as baking, boiling, grilling, and broiling instead.  Cook with  heart-healthy oils, such as olive, canola, soybean, or sunflower oil. Meal planning   Eat a balanced diet that includes: ? 5 or more servings of fruits and vegetables each day. At each meal, try to fill half of your plate with fruits and vegetables. ? Up to 6-8 servings of whole grains each day. ? Less than 6 oz of lean meat, poultry, or fish each day. A 3-oz serving of meat is about the same size as a deck of cards. One egg equals 1 oz. ? 2 servings of low-fat dairy each day. ? A serving of nuts, seeds, or beans 5 times each week. ? Heart-healthy fats. Healthy fats called Omega-3 fatty acids are found in foods such as flaxseeds and coldwater fish, like sardines, salmon, and mackerel.  Limit how much you eat of the following: ? Canned or prepackaged foods. ? Food that is high in trans fat, such as fried foods. ? Food that is high in saturated fat, such as fatty meat. ? Sweets, desserts, sugary drinks, and other foods with added sugar. ? Full-fat dairy products.  Do not salt foods before eating.  Try to eat at least 2 vegetarian meals each week.  Eat more home-cooked food and less restaurant, buffet, and fast food.  When eating at a restaurant, ask that your food be prepared with less salt or no salt, if possible. What foods are recommended? The items listed may not be a complete list. Talk with your dietitian about what   dietary choices are best for you. Grains Whole-grain or whole-wheat bread. Whole-grain or whole-wheat pasta. Brown rice. Oatmeal. Quinoa. Bulgur. Whole-grain and low-sodium cereals. Pita bread. Low-fat, low-sodium crackers. Whole-wheat flour tortillas. Vegetables Fresh or frozen vegetables (raw, steamed, roasted, or grilled). Low-sodium or reduced-sodium tomato and vegetable juice. Low-sodium or reduced-sodium tomato sauce and tomato paste. Low-sodium or reduced-sodium canned vegetables. Fruits All fresh, dried, or frozen fruit. Canned fruit in natural juice (without  added sugar). Meat and other protein foods Skinless chicken or turkey. Ground chicken or turkey. Pork with fat trimmed off. Fish and seafood. Egg whites. Dried beans, peas, or lentils. Unsalted nuts, nut butters, and seeds. Unsalted canned beans. Lean cuts of beef with fat trimmed off. Low-sodium, lean deli meat. Dairy Low-fat (1%) or fat-free (skim) milk. Fat-free, low-fat, or reduced-fat cheeses. Nonfat, low-sodium ricotta or cottage cheese. Low-fat or nonfat yogurt. Low-fat, low-sodium cheese. Fats and oils Soft margarine without trans fats. Vegetable oil. Low-fat, reduced-fat, or light mayonnaise and salad dressings (reduced-sodium). Canola, safflower, olive, soybean, and sunflower oils. Avocado. Seasoning and other foods Herbs. Spices. Seasoning mixes without salt. Unsalted popcorn and pretzels. Fat-free sweets. What foods are not recommended? The items listed may not be a complete list. Talk with your dietitian about what dietary choices are best for you. Grains Baked goods made with fat, such as croissants, muffins, or some breads. Dry pasta or rice meal packs. Vegetables Creamed or fried vegetables. Vegetables in a cheese sauce. Regular canned vegetables (not low-sodium or reduced-sodium). Regular canned tomato sauce and paste (not low-sodium or reduced-sodium). Regular tomato and vegetable juice (not low-sodium or reduced-sodium). Pickles. Olives. Fruits Canned fruit in a light or heavy syrup. Fried fruit. Fruit in cream or butter sauce. Meat and other protein foods Fatty cuts of meat. Ribs. Fried meat. Bacon. Sausage. Bologna and other processed lunch meats. Salami. Fatback. Hotdogs. Bratwurst. Salted nuts and seeds. Canned beans with added salt. Canned or smoked fish. Whole eggs or egg yolks. Chicken or turkey with skin. Dairy Whole or 2% milk, cream, and half-and-half. Whole or full-fat cream cheese. Whole-fat or sweetened yogurt. Full-fat cheese. Nondairy creamers. Whipped toppings.  Processed cheese and cheese spreads. Fats and oils Butter. Stick margarine. Lard. Shortening. Ghee. Bacon fat. Tropical oils, such as coconut, palm kernel, or palm oil. Seasoning and other foods Salted popcorn and pretzels. Onion salt, garlic salt, seasoned salt, table salt, and sea salt. Worcestershire sauce. Tartar sauce. Barbecue sauce. Teriyaki sauce. Soy sauce, including reduced-sodium. Steak sauce. Canned and packaged gravies. Fish sauce. Oyster sauce. Cocktail sauce. Horseradish that you find on the shelf. Ketchup. Mustard. Meat flavorings and tenderizers. Bouillon cubes. Hot sauce and Tabasco sauce. Premade or packaged marinades. Premade or packaged taco seasonings. Relishes. Regular salad dressings. Where to find more information:  National Heart, Lung, and Blood Institute: www.nhlbi.nih.gov  American Heart Association: www.heart.org Summary  The DASH eating plan is a healthy eating plan that has been shown to reduce high blood pressure (hypertension). It may also reduce your risk for type 2 diabetes, heart disease, and stroke.  With the DASH eating plan, you should limit salt (sodium) intake to 2,300 mg a day. If you have hypertension, you may need to reduce your sodium intake to 1,500 mg a day.  When on the DASH eating plan, aim to eat more fresh fruits and vegetables, whole grains, lean proteins, low-fat dairy, and heart-healthy fats.  Work with your health care provider or diet and nutrition specialist (dietitian) to adjust your eating plan to your individual   calorie needs. This information is not intended to replace advice given to you by your health care provider. Make sure you discuss any questions you have with your health care provider. Document Released: 04/22/2011 Document Revised: 04/26/2016 Document Reviewed: 04/26/2016 Elsevier Interactive Patient Education  2018 Elsevier Inc.  

## 2017-10-13 ENCOUNTER — Other Ambulatory Visit: Payer: Self-pay | Admitting: Nurse Practitioner

## 2017-10-13 ENCOUNTER — Telehealth: Payer: Self-pay | Admitting: Podiatry

## 2017-10-13 DIAGNOSIS — Z1231 Encounter for screening mammogram for malignant neoplasm of breast: Secondary | ICD-10-CM

## 2017-10-13 NOTE — Telephone Encounter (Signed)
I asked pt what medication was not working for her and she states her feet are just itching so bad. I told pt I would inform Dr. Jacqualyn Posey and call with further instructions.

## 2017-10-13 NOTE — Telephone Encounter (Signed)
Medication prescribed isn't working for patient she wants to know if she can be prescribed something different. If you can call her back at [phone]

## 2017-10-14 MED ORDER — CLOTRIMAZOLE-BETAMETHASONE 1-0.05 % EX CREA: 1.0000 "application " | TOPICAL_CREAM | Freq: Two times a day (BID) | CUTANEOUS | 2 refills | Status: DC

## 2017-10-14 NOTE — Addendum Note (Signed)
Addended by: Harriett Sine D on: 10/14/2017 09:11 AM   Modules accepted: Orders

## 2017-10-14 NOTE — Telephone Encounter (Signed)
I informed pt Dr. Jacqualyn Posey ordered a different cream and I would send to her pharmacy.

## 2017-10-14 NOTE — Telephone Encounter (Signed)
Please order Lotrisone cream

## 2017-10-27 ENCOUNTER — Ambulatory Visit: Payer: Medicare HMO | Admitting: Podiatry

## 2017-11-04 ENCOUNTER — Other Ambulatory Visit: Payer: Self-pay

## 2017-11-04 ENCOUNTER — Telehealth: Payer: Self-pay | Admitting: Nurse Practitioner

## 2017-11-04 DIAGNOSIS — K219 Gastro-esophageal reflux disease without esophagitis: Secondary | ICD-10-CM

## 2017-11-04 DIAGNOSIS — E782 Mixed hyperlipidemia: Secondary | ICD-10-CM

## 2017-11-04 DIAGNOSIS — R05 Cough: Secondary | ICD-10-CM

## 2017-11-04 DIAGNOSIS — R053 Chronic cough: Secondary | ICD-10-CM

## 2017-11-04 MED ORDER — PRAVASTATIN SODIUM 40 MG PO TABS: 40.0000 mg | ORAL_TABLET | Freq: Every day | ORAL | 0 refills | Status: DC

## 2017-11-04 MED ORDER — ALBUTEROL SULFATE HFA 108 (90 BASE) MCG/ACT IN AERS: 2.0000 | INHALATION_SPRAY | Freq: Four times a day (QID) | RESPIRATORY_TRACT | 5 refills | Status: DC | PRN

## 2017-11-04 MED ORDER — OMEPRAZOLE 20 MG PO CPDR: 20.0000 mg | DELAYED_RELEASE_CAPSULE | Freq: Every day | ORAL | 0 refills | Status: DC

## 2017-11-04 NOTE — Telephone Encounter (Signed)
Patient called requesting medication refill on fluticasone [facility] HFA) 110 MCG/ACT inhaler [[id number]] , pravastatin (PRAVACHOL) 40 MG tablet [[id number], omeprazole (PRILOSEC) 20 MG capsule [[id number]]

## 2017-11-04 NOTE — Telephone Encounter (Signed)
CMA spoke to patient to inform on her Rx has been filled and scheduled patient an upcoming appt. For hypertension follow-up.

## 2017-11-09 ENCOUNTER — Ambulatory Visit
Admission: RE | Admit: 2017-11-09 | Discharge: 2017-11-09 | Disposition: A | Discharge status: Home / Self Care | Payer: Medicare HMO | Source: Ambulatory Visit | Attending: Nurse Practitioner | Admitting: Nurse Practitioner

## 2017-11-09 ENCOUNTER — Ambulatory Visit: Payer: Medicare HMO

## 2017-11-09 DIAGNOSIS — Z1231 Encounter for screening mammogram for malignant neoplasm of breast: Secondary | ICD-10-CM

## 2017-11-09 DIAGNOSIS — R6889 Other general symptoms and signs: Secondary | ICD-10-CM | POA: Diagnosis not present

## 2017-11-22 ENCOUNTER — Telehealth: Payer: Self-pay | Admitting: Nurse Practitioner

## 2017-11-22 DIAGNOSIS — J441 Chronic obstructive pulmonary disease with (acute) exacerbation: Secondary | ICD-10-CM

## 2017-11-22 MED ORDER — FLUTICASONE PROPIONATE HFA 110 MCG/ACT IN AERO: 2.0000 | INHALATION_SPRAY | Freq: Two times a day (BID) | RESPIRATORY_TRACT | 2 refills | Status: DC

## 2017-11-22 NOTE — Telephone Encounter (Signed)
Patient called requesting a refill on fluticasone (FLOVENT HFA) 110 MCG/ACT inhaler  Patient uses [facility]. Please f/u

## 2017-11-24 ENCOUNTER — Ambulatory Visit: Payer: Medicare HMO | Admitting: Podiatry

## 2017-12-15 ENCOUNTER — Encounter: Payer: Self-pay | Admitting: Podiatry

## 2017-12-15 ENCOUNTER — Other Ambulatory Visit: Payer: Self-pay | Admitting: Podiatry

## 2017-12-15 ENCOUNTER — Ambulatory Visit (INDEPENDENT_AMBULATORY_CARE_PROVIDER_SITE_OTHER): Payer: Medicare HMO

## 2017-12-15 ENCOUNTER — Ambulatory Visit: Payer: Medicare HMO | Admitting: Podiatry

## 2017-12-15 DIAGNOSIS — M19071 Primary osteoarthritis, right ankle and foot: Secondary | ICD-10-CM

## 2017-12-15 DIAGNOSIS — B353 Tinea pedis: Secondary | ICD-10-CM | POA: Diagnosis not present

## 2017-12-15 DIAGNOSIS — M898X9 Other specified disorders of bone, unspecified site: Secondary | ICD-10-CM

## 2017-12-15 DIAGNOSIS — M79671 Pain in right foot: Secondary | ICD-10-CM

## 2017-12-15 DIAGNOSIS — B351 Tinea unguium: Secondary | ICD-10-CM | POA: Diagnosis not present

## 2017-12-15 MED ORDER — NAFTIFINE HCL 2 % EX CREA: 1.0000 "application " | TOPICAL_CREAM | CUTANEOUS | 2 refills | Status: DC

## 2017-12-15 MED ORDER — DICLOFENAC SODIUM 1 % TD GEL: 2.0000 g | Freq: Four times a day (QID) | TRANSDERMAL | 2 refills | Status: DC

## 2017-12-15 NOTE — Patient Instructions (Signed)

## 2017-12-19 NOTE — Progress Notes (Signed)
Subjective: 62 year old female presents the office today for follow-up evaluation of toenail fungus as well as athlete's foot.  She is doing the Lamisil without any side effects.  She states that she is much improved but she has not yet noticed much change from the toenails.  Denies any pain in the nails.  She also does complain today of right foot pain on the top of the foot.  This is been ongoing for several years but is becoming more consistent.  She denies any recent injury or trauma to the area.  She said no recent treatment.  No significant increase in swelling.  No redness.  No open sores. Denies any systemic complaints such as fevers, chills, nausea, vomiting. No acute changes since last appointment, and no other complaints at this time.   Objective: AAO x3, NAD DP/PT pulses palpable bilaterally, CRT less than 3 seconds Overall the athlete's foot is much improved.  The nails continue to be hypertrophic, dystrophic, discolored however there is some mild evidence of clearing on the proximal nail corners.  There is no pain in the nails there is no swelling redness or drainage or any signs of infection. Dorsal bone spur palpable off the dorsal aspect the right midfoot and this is where she subjectively gets discomfort however she is not expensing any pain today.  There is no other area pinpoint tenderness.  There is no significant increase in edema, erythema, increase in warmth. No open lesions or pre-ulcerative lesions.  No pain with calf compression, swelling, warmth, erythema  Assessment: Tinea pedis, onychomycosis, currently on Lamisil; right foot pain, exostosis  Plan: -All treatment options discussed with the patient including all alternatives, risks, complications.  -We will continue Lamisil for total of 3 months.  Recheck a CBC and LFT.  Continue to monitor for any side effects of medication which she has not had.  Discussed this can take several months to see improvement from Lamisil for  the toenails. -X-rays were obtained reviewed which did reveal a dorsal exostosis of the right midfoot.  This is subjectively where she has discomfort.  Discussed padding, offloading as well as releasing his tennis shoes to take pressure off the area and wearing shoes that do not put pressure to this area.  Prescribed Voltaren gel. -Patient encouraged to call the office with any questions, concerns, change in symptoms.   Return in about 6 weeks (around 01/26/2018).  Trula Slade DPM

## 2017-12-22 ENCOUNTER — Telehealth: Payer: Self-pay | Admitting: *Deleted

## 2017-12-22 MED ORDER — CLOTRIMAZOLE-BETAMETHASONE 1-0.05 % EX CREA: 1.0000 "application " | TOPICAL_CREAM | Freq: Two times a day (BID) | CUTANEOUS | 2 refills | Status: DC

## 2017-12-22 NOTE — Telephone Encounter (Signed)
Request for medication alternate due to insurance coverage of Naftin. Dr. Jacqualyn Posey ordered Clotrimazole [id number] for application bid [id number].

## 2018-01-04 ENCOUNTER — Ambulatory Visit: Payer: Medicare HMO | Attending: Nurse Practitioner | Admitting: Nurse Practitioner

## 2018-01-04 ENCOUNTER — Encounter: Payer: Self-pay | Admitting: Nurse Practitioner

## 2018-01-04 DIAGNOSIS — M1990 Unspecified osteoarthritis, unspecified site: Secondary | ICD-10-CM | POA: Diagnosis not present

## 2018-01-04 DIAGNOSIS — Z882 Allergy status to sulfonamides status: Secondary | ICD-10-CM | POA: Insufficient documentation

## 2018-01-04 DIAGNOSIS — Z23 Encounter for immunization: Secondary | ICD-10-CM | POA: Diagnosis not present

## 2018-01-04 DIAGNOSIS — Z885 Allergy status to narcotic agent status: Secondary | ICD-10-CM | POA: Insufficient documentation

## 2018-01-04 DIAGNOSIS — E669 Obesity, unspecified: Secondary | ICD-10-CM | POA: Diagnosis not present

## 2018-01-04 DIAGNOSIS — E782 Mixed hyperlipidemia: Secondary | ICD-10-CM | POA: Diagnosis not present

## 2018-01-04 DIAGNOSIS — Z79899 Other long term (current) drug therapy: Secondary | ICD-10-CM | POA: Insufficient documentation

## 2018-01-04 DIAGNOSIS — G4730 Sleep apnea, unspecified: Secondary | ICD-10-CM | POA: Insufficient documentation

## 2018-01-04 DIAGNOSIS — R6889 Other general symptoms and signs: Secondary | ICD-10-CM | POA: Diagnosis not present

## 2018-01-04 DIAGNOSIS — I10 Essential (primary) hypertension: Secondary | ICD-10-CM | POA: Insufficient documentation

## 2018-01-04 NOTE — Progress Notes (Signed)
Assessment & Plan:  Leah Hampton was seen today for follow-up.  Diagnoses and all orders for this visit:  Essential hypertension Continue all antihypertensives as prescribed.  Remember to bring in your blood pressure log with you for your follow up appointment.  DASH/Mediterranean Diets are healthier choices for HTN.    Mixed hyperlipidemia INSTRUCTIONS: Work on a low fat, heart healthy diet and participate in regular aerobic exercise program by working out at least 150 minutes per week; 5 days a week-30 minutes per day. Avoid red meat, fried foods. junk foods, sodas, sugary drinks, unhealthy snacking, alcohol and smoking.  Drink at least 48oz of water per day and monitor your carbohydrate intake daily.    Need for immunization against influenza -     Flu Vaccine QUAD 36+ mos IM    Patient has been counseled on age-appropriate routine health concerns for screening and prevention. These are reviewed and up-to-date. Referrals have been placed accordingly. Immunizations are up-to-date or declined.    Subjective:   Chief Complaint  Patient presents with  . Follow-up    Pt. is here for follow-up on hypertension.    HPI Leah Hampton 62 y.o. female presents to office today for follow up to HTN and HPL. Currently seeing podiatry for toenail fungus. She is taking Lamisil at this time. Denies any side effects.   Essential Hypertension Blood pressure is well controlled today. She endorses medication compliance taking Hyzaar 100-25mg  daily. She has lost 10lbs over the past 4 months and making dietary changes as well.  BP Readings from Last 3 Encounters:  01/04/18 114/77  09/21/17 134/83  08/22/17 (!) 155/84   Hyperlipidemia Patient presents for follow up to hyperlipidemia.  She is medication compliant taking pravastatin 40mg  daily. She is diet compliant and denies skin xanthelasma or statin intolerance including myalgias.  Lab Results  Component Value Date   CHOL 171 05/23/2017    Lab Results  Component Value Date   HDL 55 05/23/2017   Lab Results  Component Value Date   LDLCALC 104 (H) 05/23/2017   Lab Results  Component Value Date   TRIG 62 05/23/2017   Lab Results  Component Value Date   CHOLHDL 3.1 05/23/2017    Review of Systems  Constitutional: Negative for fever, malaise/fatigue and weight loss.  HENT: Negative.  Negative for nosebleeds.   Eyes: Negative.  Negative for blurred vision, double vision and photophobia.  Respiratory: Negative.  Negative for cough and shortness of breath.   Cardiovascular: Negative.  Negative for chest pain, palpitations and leg swelling.  Gastrointestinal: Positive for heartburn. Negative for nausea and vomiting.  Musculoskeletal: Negative.  Negative for myalgias.       Right knee and right foot pain  Neurological: Negative.  Negative for dizziness, focal weakness, seizures and headaches.  Psychiatric/Behavioral: Negative.  Negative for suicidal ideas.    Past Medical History:  Diagnosis Date  . Bronchitis   . Cystitis   . Hematuria    urologice eval, Dr Estill Dooms  . History of chest pain   . Hyperbilirubinemia   . Hyperlipemia   . Hypertelorism   . Hypertension   . Leukocytosis   . Obesity   . Osteoarthritis    right knee  . Sleep apnea 2012   slight     Past Surgical History:  Procedure Laterality Date  . COLONOSCOPY WITH PROPOFOL N/A 03/27/2015   Procedure: COLONOSCOPY WITH PROPOFOL;  Surgeon: Milus Banister, MD;  Location: [facility];  Service: Endoscopy;  Laterality:  N/A;  . DILATATION & CURETTAGE/HYSTEROSCOPY WITH MYOSURE N/A 01/05/2017   Procedure: DILATATION & CURETTAGE/HYSTEROSCOPY WITH MYOSURE;  Surgeon: Thurnell Lose, MD;  Location: [facility];  Service: Gynecology;  Laterality: N/A;  PostMenopausal Bleeding  . KNEE ARTHROSCOPY  04/2007   left knee  . TOTAL KNEE ARTHROPLASTY  10/2008   Dr Roxine Caddy    Family History  Problem Relation Age of Onset  . Heart failure Mother   . Heart attack  Father 42  . Diabetes Unknown   . Arthritis Unknown   . Stroke Unknown   . Coronary artery disease Unknown   . Heart failure Sister        x 2  . Colon cancer Neg Hx     Social History Reviewed with no changes to be made today.   Outpatient Medications Prior to Visit  Medication Sig Dispense Refill  . albuterol (PROVENTIL HFA;VENTOLIN HFA) 108 (90 Base) MCG/ACT inhaler Inhale 2 puffs into the lungs every 6 (six) hours as needed for wheezing or shortness of breath. 3 Inhaler 5  . clotrimazole-betamethasone (LOTRISONE) cream Apply 1 application topically 2 (two) times daily. 45 g 2  . diclofenac sodium (VOLTAREN) 1 % GEL Apply 2 g topically 4 (four) times daily. Rub into affected area of foot 2 to 4 times daily 100 g 2  . fluticasone (FLOVENT HFA) 110 MCG/ACT inhaler Inhale 2 puffs into the lungs 2 (two) times daily. 12 g 2  . losartan-hydrochlorothiazide (HYZAAR) 100-25 MG tablet Take 1 tablet by mouth daily. 90 tablet 3  . naproxen (NAPROSYN) 500 MG tablet Take 1 tablet (500 mg total) by mouth 2 (two) times daily with a meal. 30 tablet 1  . omeprazole (PRILOSEC) 20 MG capsule Take 1 capsule (20 mg total) by mouth daily. 90 capsule 0  . pravastatin (PRAVACHOL) 40 MG tablet Take 1 tablet (40 mg total) by mouth daily. 90 tablet 0   No facility-administered medications prior to visit.     Allergies  Allergen Reactions  . Sulfamethoxazole-Trimethoprim [Bactrim] Itching  . Codeine Nausea And Vomiting and Anxiety       Objective:    BP 114/77 (BP Location: Left Arm, Patient Position: Sitting, Cuff Size: Large)   Pulse 90   Temp 98.2 F (36.8 C) (Oral)   Ht 5\' 4"  (1.626 m)   Wt (!) 306 lb 9.6 oz (139.1 kg)   LMP 11/10/2011   SpO2 95%   BMI 52.63 kg/m  Wt Readings from Last 3 Encounters:  01/04/18 (!) 306 lb 9.6 oz (139.1 kg)  09/21/17 (!) 309 lb 9.6 oz (140.4 kg)  08/22/17 (!) 316 lb 6.4 oz (143.5 kg)    Physical Exam  Constitutional: She is oriented to person, place, and  time. She appears well-developed and well-nourished. She is cooperative.  HENT:  Head: Normocephalic and atraumatic.  Eyes: EOM are normal.  Neck: Normal range of motion.  Cardiovascular: Normal rate, regular rhythm and normal heart sounds. Exam reveals no gallop and no friction rub.  No murmur heard. Pulmonary/Chest: Effort normal and breath sounds normal. No tachypnea. No respiratory distress. She has no decreased breath sounds. She has no wheezes. She has no rhonchi. She has no rales. She exhibits no tenderness.  Abdominal: Bowel sounds are normal.  Musculoskeletal: Normal range of motion. She exhibits no edema.  Neurological: She is alert and oriented to person, place, and time. Coordination normal.  Skin: Skin is warm and dry.  Psychiatric: She has a normal mood and affect. Her behavior  is normal. Judgment and thought content normal.  Nursing note and vitals reviewed.      Patient has been counseled extensively about nutrition and exercise as well as the importance of adherence with medications and regular follow-up. The patient was given clear instructions to go to ER or return to medical center if symptoms don't improve, worsen or new problems develop. The patient verbalized understanding.   Follow-up: Return in about 3 months (around 04/06/2018) for HTN.   Gildardo Pounds, FNP-BC [facility] [place], [place]   01/12/2018, 8:33 PM

## 2018-01-09 ENCOUNTER — Other Ambulatory Visit: Payer: Self-pay | Admitting: Nurse Practitioner

## 2018-01-09 DIAGNOSIS — E782 Mixed hyperlipidemia: Secondary | ICD-10-CM

## 2018-01-09 DIAGNOSIS — K219 Gastro-esophageal reflux disease without esophagitis: Secondary | ICD-10-CM

## 2018-01-10 DIAGNOSIS — Z79899 Other long term (current) drug therapy: Secondary | ICD-10-CM | POA: Diagnosis not present

## 2018-01-11 ENCOUNTER — Telehealth: Payer: Self-pay | Admitting: *Deleted

## 2018-01-11 LAB — HEPATIC FUNCTION PANEL
AG RATIO: 1.1 (calc) (ref 1.0–2.5)
ALKALINE PHOSPHATASE (APISO): 73 U/L (ref 33–130)
ALT: 17 U/L (ref 6–29)
AST: 23 U/L (ref 10–35)
Albumin: 3.9 g/dL (ref 3.6–5.1)
Bilirubin, Direct: 0.1 mg/dL (ref 0.0–0.2)
Globulin: 3.7 g/dL (calc) (ref 1.9–3.7)
Indirect Bilirubin: 0.3 mg/dL (calc) (ref 0.2–1.2)
TOTAL PROTEIN: 7.6 g/dL (ref 6.1–8.1)
Total Bilirubin: 0.4 mg/dL (ref 0.2–1.2)

## 2018-01-11 LAB — CBC WITH DIFFERENTIAL/PLATELET
Basophils Absolute: 32 cells/uL (ref 0–200)
Basophils Relative: 0.3 %
EOS ABS: 189 {cells}/uL (ref 15–500)
Eosinophils Relative: 1.8 %
HCT: 37.7 % (ref 35.0–45.0)
HEMOGLOBIN: 12.2 g/dL (ref 11.7–15.5)
Lymphs Abs: 4284 cells/uL — ABNORMAL HIGH (ref 850–3900)
MCH: 28.1 pg (ref 27.0–33.0)
MCHC: 32.4 g/dL (ref 32.0–36.0)
MCV: 86.9 fL (ref 80.0–100.0)
MONOS PCT: 6.5 %
MPV: 10.5 fL (ref 7.5–12.5)
NEUTROS ABS: 5313 {cells}/uL (ref 1500–7800)
Neutrophils Relative %: 50.6 %
Platelets: 320 10*3/uL (ref 140–400)
RBC: 4.34 10*6/uL (ref 3.80–5.10)
RDW: 14.1 % (ref 11.0–15.0)
TOTAL LYMPHOCYTE: 40.8 %
WBC mixed population: 683 cells/uL (ref 200–950)
WBC: 10.5 10*3/uL (ref 3.8–10.8)

## 2018-01-11 MED ORDER — TERBINAFINE HCL 250 MG PO TABS: 250.0000 mg | ORAL_TABLET | Freq: Every day | ORAL | 0 refills | Status: DC

## 2018-01-11 NOTE — Telephone Encounter (Signed)
I informed pt of Dr. Wagoner's review of results and orders. 

## 2018-01-11 NOTE — Telephone Encounter (Signed)
-----   Message from Trula Slade, DPM sent at 01/11/2018 11:50 AM EDT ----- Leah Hampton- please let her know that her blood work is good and she can continue Lamisil. Follow-up after she completes the 3 months.

## 2018-01-11 NOTE — Telephone Encounter (Signed)
Unable to leave a message on pt's mobile phone voicemail has not been set up.

## 2018-01-12 ENCOUNTER — Encounter: Payer: Self-pay | Admitting: Nurse Practitioner

## 2018-01-13 ENCOUNTER — Telehealth: Payer: Self-pay | Admitting: Nurse Practitioner

## 2018-01-13 NOTE — Telephone Encounter (Signed)
Patient called and said the the cough is getting worse not better. Patient sated at night its keeping her up.

## 2018-01-18 ENCOUNTER — Other Ambulatory Visit: Payer: Self-pay | Admitting: Nurse Practitioner

## 2018-01-18 MED ORDER — BENZONATATE 100 MG PO CAPS: 100.0000 mg | ORAL_CAPSULE | Freq: Two times a day (BID) | ORAL | 0 refills | Status: DC | PRN

## 2018-01-18 MED ORDER — MONTELUKAST SODIUM 10 MG PO TABS: 10.0000 mg | ORAL_TABLET | Freq: Every day | ORAL | 3 refills | Status: DC

## 2018-01-18 NOTE — Telephone Encounter (Signed)
I have sent a cough medicine to her pharmacy and another medication for her to take at night. Please make sure she is using her inhalers as prescribed.

## 2018-01-19 NOTE — Telephone Encounter (Signed)
CMA spoke to patient to inform that PCP sent new Rx.  Pt. Understood.

## 2018-01-26 ENCOUNTER — Ambulatory Visit: Payer: Medicare HMO | Admitting: Podiatry

## 2018-01-31 ENCOUNTER — Other Ambulatory Visit: Payer: Self-pay | Admitting: Nurse Practitioner

## 2018-01-31 DIAGNOSIS — J441 Chronic obstructive pulmonary disease with (acute) exacerbation: Secondary | ICD-10-CM

## 2018-02-08 ENCOUNTER — Ambulatory Visit (INDEPENDENT_AMBULATORY_CARE_PROVIDER_SITE_OTHER): Payer: Medicare HMO | Admitting: Family

## 2018-02-08 ENCOUNTER — Encounter: Payer: Self-pay | Admitting: Family

## 2018-02-08 VITALS — BP 120/63 | HR 92 | Temp 98.3°F | Resp 16 | Ht 62.0 in | Wt 299.0 lb

## 2018-02-08 DIAGNOSIS — M1711 Unilateral primary osteoarthritis, right knee: Secondary | ICD-10-CM | POA: Diagnosis not present

## 2018-02-08 DIAGNOSIS — E559 Vitamin D deficiency, unspecified: Secondary | ICD-10-CM

## 2018-02-08 DIAGNOSIS — E785 Hyperlipidemia, unspecified: Secondary | ICD-10-CM

## 2018-02-08 DIAGNOSIS — I10 Essential (primary) hypertension: Secondary | ICD-10-CM

## 2018-02-08 DIAGNOSIS — B009 Herpesviral infection, unspecified: Secondary | ICD-10-CM

## 2018-02-08 DIAGNOSIS — G4733 Obstructive sleep apnea (adult) (pediatric): Secondary | ICD-10-CM | POA: Diagnosis not present

## 2018-02-08 LAB — BASIC METABOLIC PANEL
BUN: 14 mg/dL (ref 6–23)
CALCIUM: 9.5 mg/dL (ref 8.4–10.5)
CO2: 33 mEq/L — ABNORMAL HIGH (ref 19–32)
CREATININE: 0.9 mg/dL (ref 0.40–1.20)
Chloride: 100 mEq/L (ref 96–112)
GFR: 82.04 mL/min (ref >60.00)
Glucose, Bld: 109 mg/dL — ABNORMAL HIGH (ref 70–99)
POTASSIUM: 3.3 meq/L — AB (ref 3.5–5.1)
Sodium: 141 mEq/L (ref 135–145)

## 2018-02-08 LAB — VITAMIN D 25 HYDROXY (VIT D DEFICIENCY, FRACTURES): VITD: 21.3 ng/mL — AB (ref 30.00–100.00)

## 2018-02-08 MED ORDER — VALACYCLOVIR HCL 500 MG PO TABS: 500.0000 mg | ORAL_TABLET | Freq: Every day | ORAL | 1 refills | Status: DC

## 2018-02-08 MED ORDER — ZOSTER VAC RECOMB ADJUVANTED 50 MCG/0.5ML IM SUSR: INTRAMUSCULAR | 1 refills | Status: DC

## 2018-02-08 NOTE — Progress Notes (Signed)
Subjective:    Patient ID: Leah Hampton, female    DOB: Mar 30, 1956, 62 y.o.   MRN: [id number]  HPI  Patient is a 62 yr old female who presents today to re-establish care. She was last seen on 11/14/13.  Pmhx is significant for the following:  OSA- diagnosed as mild in 2012.  Reports that she recently started working out.   Will fall asleep watching television.   HTN- treated with losartan-hctz 100-25.  BP Readings from Last 3 Encounters:  02/08/18 120/63  01/04/18 114/77  09/21/17 134/83   Hyperlipidemia- maintained on pravastatin.  Denies myalgia.   Lab Results  Component Value Date   CHOL 171 05/23/2017   HDL 55 05/23/2017   LDLCALC 104 (H) 05/23/2017   TRIG 62 05/23/2017   CHOLHDL 3.1 05/23/2017   GERD- maintained on PPI.  Reports symptoms are well controlled.  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   Review of Systems  Constitutional: Negative for unexpected weight change.  HENT: Negative for hearing loss and postnasal drip.   Eyes: Negative for visual disturbance.  Respiratory:       Reports mild cough, started when her AC went out at her house.   Cardiovascular:       Reports some LE edema  Gastrointestinal: Negative for constipation and diarrhea.  Genitourinary: Negative for dysuria and frequency.  Musculoskeletal: Positive for arthralgias.  Skin:       Reports rash on breasts  Neurological:       Occasional HA's  Hematological: Negative for adenopathy.  Psychiatric/Behavioral:       Denies depression/anxiety       Past Medical History:  Diagnosis Date  . Bronchitis   . Cystitis   . Hematuria    urologice eval, Dr Estill Dooms  . History of chest pain   . Hyperbilirubinemia   . Hyperlipemia   . Hypertelorism   . Hypertension   . Leukocytosis   . Obesity   . Osteoarthritis    right knee  . Sleep apnea 2012    slight      Social History   Socioeconomic History  . Marital status: Single    Spouse name: Not on file  . Number of children: 2  . Years of education: Not on file  . Highest education level: Not on file  Occupational History  . Occupation: disabled  Social Needs  . Financial resource strain: Not on file  . Food insecurity:    Worry: Sometimes true    Inability: Sometimes true  . Transportation needs:    Medical: No    Non-medical: No  Tobacco Use  . Smoking status: Never Smoker  . Smokeless tobacco: Never Used  Substance and Sexual Activity  . Alcohol use: No  . Drug use: No  . Sexual activity: Not Currently  Lifestyle  . Physical activity:    Days per week: 3 days    Minutes per session: 60 min  . Stress: Not on file  Relationships  . Social connections:    Talks on phone: More than three times a week    Gets together: Three times a week    Attends religious service: More than 4 times per year    Active member of club or organization: Yes    Attends meetings of clubs or organizations: More than  4 times per year    Relationship status: Married  . Intimate partner violence:    Fear of current or ex partner: Not on file    Emotionally abused: Not on file    Physically abused: Not on file    Forced sexual activity: Not on file  Other Topics Concern  . Not on file  Social History Narrative   Regular exercise:  No          Past Surgical History:  Procedure Laterality Date  . COLONOSCOPY WITH PROPOFOL N/A 03/27/2015   Procedure: COLONOSCOPY WITH PROPOFOL;  Surgeon: Milus Banister, MD;  Location: [facility];  Service: Endoscopy;  Laterality: N/A;  . DILATATION & CURETTAGE/HYSTEROSCOPY WITH MYOSURE N/A 01/05/2017   Procedure: DILATATION & CURETTAGE/HYSTEROSCOPY WITH MYOSURE;  Surgeon: Thurnell Lose, MD;  Location: [facility];  Service: Gynecology;  Laterality: N/A;  PostMenopausal Bleeding  . KNEE ARTHROSCOPY  04/2007   left knee  . TOTAL KNEE ARTHROPLASTY   10/2008   Dr Roxine Caddy    Family History  Problem Relation Age of Onset  . Heart failure Mother   . Arthritis Mother   . Hypertension Mother   . Stroke Mother   . Heart attack Father 20  . Diabetes Unknown   . Arthritis Unknown   . Stroke Unknown   . Coronary artery disease Unknown   . Heart failure Sister        x 2  . Colon cancer Neg Hx     Allergies  Allergen Reactions  . Sulfamethoxazole-Trimethoprim [Bactrim] Itching  . Codeine Nausea And Vomiting and Anxiety    Current Outpatient Medications on File Prior to Visit  Medication Sig Dispense Refill  . Cyanocobalamin (VITAMIN B-12) 1000 MCG/15ML LIQD     . diclofenac sodium (VOLTAREN) 1 % GEL Apply 2 g topically 4 (four) times daily. Rub into affected area of foot 2 to 4 times daily 100 g 2  . fluticasone (FLOVENT HFA) 110 MCG/ACT inhaler Inhale into the lungs 2 (two) times daily.    Marland Kitchen losartan-hydrochlorothiazide (HYZAAR) 100-25 MG tablet Take 1 tablet by mouth daily. 90 tablet 3  . omeprazole (PRILOSEC) 20 MG capsule TAKE 1 CAPSULE EVERY DAY 90 capsule 0  . pravastatin (PRAVACHOL) 40 MG tablet TAKE 1 TABLET EVERY DAY 90 tablet 0  . terbinafine (LAMISIL) 250 MG tablet Take 250 mg by mouth daily.     No current facility-administered medications on file prior to visit.     BP 120/63 (BP Location: Right Arm, Patient Position: Sitting, Cuff Size: Large)   Pulse 92   Temp 98.3 F (36.8 C) (Oral)   Resp 16   Ht 5\' 2"  (1.575 m)   Wt 299 lb (135.6 kg)   LMP 11/10/2011   SpO2 98%   BMI 54.69 kg/m    Objective:   Physical Exam  Constitutional: She is oriented to person, place, and time. She appears well-developed and well-nourished.  HENT:  Head: Normocephalic and atraumatic.  Right Ear: Tympanic membrane and ear canal normal.  Left Ear: Tympanic membrane and ear canal normal.  Neck: Neck supple. No thyromegaly present.  Cardiovascular: Normal rate, regular rhythm and normal heart sounds.  No murmur  heard. Pulmonary/Chest: Effort normal and breath sounds normal. No respiratory distress. She has no wheezes.  Musculoskeletal:  1-2+ bilateral LE edema  Lymphadenopathy:    She has no cervical adenopathy.  Neurological: She is alert and oriented to person, place, and time.  Skin: Skin is warm and dry.  Psychiatric: She has a normal mood and affect. Her behavior is normal. Judgment and thought content normal.          Assessment & Plan:  OSA- not on cpap, order split night study.  Morbid obesity- encouraged pt to continue diet/exercise/weight loss efforts.  HTN- bp stable on current meds.    Vit D deficiency- check follow up vit D level.  Hyperlipidemia- stable on statin, continue same.   HSV2- desires suppressive therapy. Begin daily valtrex 500mg .   DJD knee- working on weight loss.  GERD- stable on PPI.  Continue same.

## 2018-02-08 NOTE — Patient Instructions (Signed)
Please complete lab work prior to leaving.   

## 2018-02-09 ENCOUNTER — Telehealth: Payer: Self-pay | Admitting: Family

## 2018-02-09 ENCOUNTER — Encounter: Payer: Self-pay | Admitting: Family

## 2018-02-09 DIAGNOSIS — E559 Vitamin D deficiency, unspecified: Secondary | ICD-10-CM

## 2018-02-09 DIAGNOSIS — E876 Hypokalemia: Secondary | ICD-10-CM

## 2018-02-09 HISTORY — DX: Vitamin D deficiency, unspecified: E55.9

## 2018-02-09 MED ORDER — POTASSIUM CHLORIDE CRYS ER 20 MEQ PO TBCR: 20.0000 meq | EXTENDED_RELEASE_TABLET | Freq: Every day | ORAL | 3 refills | Status: DC

## 2018-02-09 MED ORDER — VITAMIN D (ERGOCALCIFEROL) 1.25 MG (50000 UNIT) PO CAPS: 50000.0000 [IU] | ORAL_CAPSULE | ORAL | 0 refills | Status: DC

## 2018-02-09 NOTE — Telephone Encounter (Signed)
Potassium is low. Start Kdur once daily repeat bmet in 1 week, dx hypokalemia.  Vit D is low. Please begin vit D 50000 iu once weekly x 12 weeks, then repeat vit D.  Dx vit d deficiency.

## 2018-02-09 NOTE — Addendum Note (Signed)
Addended byDamita Dunnings D on: 02/09/2018 02:40 PM   Modules accepted: Orders

## 2018-02-09 NOTE — Telephone Encounter (Signed)
Spoke w/ Pt- informed of lab results and recommendations. Pt verbalized understanding. She will go to [facility] lab next week.

## 2018-02-17 ENCOUNTER — Other Ambulatory Visit (INDEPENDENT_AMBULATORY_CARE_PROVIDER_SITE_OTHER): Payer: Medicare HMO

## 2018-02-17 DIAGNOSIS — E876 Hypokalemia: Secondary | ICD-10-CM

## 2018-02-17 LAB — BASIC METABOLIC PANEL
BUN: 16 mg/dL (ref 6–23)
CHLORIDE: 100 meq/L (ref 96–112)
CO2: 36 meq/L — AB (ref 19–32)
Calcium: 9.4 mg/dL (ref 8.4–10.5)
Creatinine, Ser: 0.98 mg/dL (ref 0.40–1.20)
GFR: 74.35 mL/min (ref >60.00)
Glucose, Bld: 98 mg/dL (ref 70–99)
POTASSIUM: 3.3 meq/L — AB (ref 3.5–5.1)
SODIUM: 142 meq/L (ref 135–145)

## 2018-02-18 ENCOUNTER — Telehealth: Payer: Self-pay | Admitting: Family

## 2018-02-18 NOTE — Telephone Encounter (Signed)
Potassium still low. Is she taking kdur daily?  If so, we should increase to 1 tab bid and send refill with new instructions.  If not taking, please start.  Repeat bmet in 1 week. Dx hypokalemia.

## 2018-02-22 ENCOUNTER — Other Ambulatory Visit: Payer: Self-pay

## 2018-02-22 DIAGNOSIS — E876 Hypokalemia: Secondary | ICD-10-CM

## 2018-02-22 NOTE — Telephone Encounter (Signed)
FYI Patient reports she was not taking the medication yet when she had blood test on Friday. She started kdur on Saturday. Advised patient to stay on one a day and have bmet ck this Friday.

## 2018-03-01 ENCOUNTER — Telehealth: Payer: Self-pay | Admitting: Family

## 2018-03-01 DIAGNOSIS — G4733 Obstructive sleep apnea (adult) (pediatric): Secondary | ICD-10-CM

## 2018-03-01 NOTE — Telephone Encounter (Signed)
Copied from [place] [id number]. Topic: Quick Communication - See Telephone Encounter >> Mar 01, 2018 10:03 AM Sheran Luz wrote: CRM for notification. See Telephone encounter for: 03/01/18.  Terry from sleep center at [facility] regarding referral that was received-pt has [employer] which requires requesting physician handle prior auth. Coralyn Mark is requesting a call back as soon as determination is made, if possible.

## 2018-03-14 NOTE — Telephone Encounter (Signed)
[name]-- please advise? 

## 2018-03-14 NOTE — Telephone Encounter (Signed)
Patient called and said if it is at a hospital then her co-pay is $85. She said that she needs to have it done at an office where it is much cheaper for her. Please call pt

## 2018-03-15 ENCOUNTER — Other Ambulatory Visit: Payer: Self-pay | Admitting: Podiatry

## 2018-03-15 ENCOUNTER — Other Ambulatory Visit: Payer: Self-pay | Admitting: Nurse Practitioner

## 2018-03-15 DIAGNOSIS — K219 Gastro-esophageal reflux disease without esophagitis: Secondary | ICD-10-CM

## 2018-03-15 DIAGNOSIS — E782 Mixed hyperlipidemia: Secondary | ICD-10-CM

## 2018-03-15 NOTE — Telephone Encounter (Signed)
Attempted to notify pt but voicemail box has not been set up yet. Will speak with the Sleep Center after talking with pt to let them know the outcome.

## 2018-03-15 NOTE — Telephone Encounter (Signed)
I am unaware of any sleep centers locally that are not hospital based.  Why don't we refer her to a sleep specialist and they can determine if they can do a home titration of cpap.

## 2018-03-16 NOTE — Telephone Encounter (Signed)
Patient called back to discuss the sleep study location options. Please call her back to advise.

## 2018-03-17 NOTE — Telephone Encounter (Signed)
Notified pt and she is agreeable to proceed with sleep specialist referral.

## 2018-03-28 ENCOUNTER — Telehealth: Payer: Self-pay | Admitting: Podiatry

## 2018-03-28 NOTE — Telephone Encounter (Signed)
Pt stated that her insurance will pay only for a 90 day supply once a year and she wanted to know could Dr. Jacqualyn Posey prescribe another medication that her insurance will pay for.

## 2018-03-29 NOTE — Telephone Encounter (Signed)
We had done a few. Can you see what prescription they are limiting? Thanks.

## 2018-03-30 NOTE — Telephone Encounter (Signed)
She should only take 90 days of lamsil. If she needs more then she should come in to be seen.

## 2018-03-30 NOTE — Telephone Encounter (Signed)
I informed pt, it takes 6-9 months to see healthy out growth of nails and if she was concerned, Dr. Jacqualyn Posey recommended she make an appt to discuss.Pt states she has an appt next month.

## 2018-04-05 ENCOUNTER — Other Ambulatory Visit: Payer: Self-pay | Admitting: Nurse Practitioner

## 2018-04-07 ENCOUNTER — Ambulatory Visit: Payer: Medicare HMO | Attending: Nurse Practitioner | Admitting: Nurse Practitioner

## 2018-04-07 ENCOUNTER — Encounter: Payer: Self-pay | Admitting: Nurse Practitioner

## 2018-04-07 VITALS — BP 103/67 | HR 99 | Temp 98.2°F | Ht 64.0 in | Wt 297.4 lb

## 2018-04-07 DIAGNOSIS — M25562 Pain in left knee: Secondary | ICD-10-CM | POA: Diagnosis not present

## 2018-04-07 DIAGNOSIS — Z8261 Family history of arthritis: Secondary | ICD-10-CM | POA: Insufficient documentation

## 2018-04-07 DIAGNOSIS — Z823 Family history of stroke: Secondary | ICD-10-CM | POA: Insufficient documentation

## 2018-04-07 DIAGNOSIS — L089 Local infection of the skin and subcutaneous tissue, unspecified: Secondary | ICD-10-CM | POA: Diagnosis not present

## 2018-04-07 DIAGNOSIS — Z8249 Family history of ischemic heart disease and other diseases of the circulatory system: Secondary | ICD-10-CM | POA: Insufficient documentation

## 2018-04-07 DIAGNOSIS — I10 Essential (primary) hypertension: Secondary | ICD-10-CM | POA: Diagnosis not present

## 2018-04-07 DIAGNOSIS — E782 Mixed hyperlipidemia: Secondary | ICD-10-CM | POA: Diagnosis not present

## 2018-04-07 DIAGNOSIS — Z6841 Body Mass Index (BMI) 40.0 and over, adult: Secondary | ICD-10-CM

## 2018-04-07 DIAGNOSIS — E785 Hyperlipidemia, unspecified: Secondary | ICD-10-CM | POA: Diagnosis not present

## 2018-04-07 DIAGNOSIS — R7303 Prediabetes: Secondary | ICD-10-CM | POA: Insufficient documentation

## 2018-04-07 DIAGNOSIS — K219 Gastro-esophageal reflux disease without esophagitis: Secondary | ICD-10-CM | POA: Insufficient documentation

## 2018-04-07 DIAGNOSIS — Z79899 Other long term (current) drug therapy: Secondary | ICD-10-CM | POA: Insufficient documentation

## 2018-04-07 DIAGNOSIS — G8929 Other chronic pain: Secondary | ICD-10-CM | POA: Diagnosis not present

## 2018-04-07 DIAGNOSIS — B9689 Other specified bacterial agents as the cause of diseases classified elsewhere: Secondary | ICD-10-CM | POA: Diagnosis not present

## 2018-04-07 DIAGNOSIS — M25561 Pain in right knee: Secondary | ICD-10-CM

## 2018-04-07 DIAGNOSIS — L98499 Non-pressure chronic ulcer of skin of other sites with unspecified severity: Secondary | ICD-10-CM | POA: Insufficient documentation

## 2018-04-07 DIAGNOSIS — M1711 Unilateral primary osteoarthritis, right knee: Secondary | ICD-10-CM | POA: Insufficient documentation

## 2018-04-07 DIAGNOSIS — Z881 Allergy status to other antibiotic agents status: Secondary | ICD-10-CM | POA: Insufficient documentation

## 2018-04-07 DIAGNOSIS — E669 Obesity, unspecified: Secondary | ICD-10-CM | POA: Insufficient documentation

## 2018-04-07 LAB — GLUCOSE, POCT (MANUAL RESULT ENTRY): POC Glucose: 118 mg/dl — AB (ref 70–99)

## 2018-04-07 LAB — POCT GLYCOSYLATED HEMOGLOBIN (HGB A1C): Hemoglobin A1C: 5.9 % — AB (ref 4.0–5.6)

## 2018-04-07 MED ORDER — IBUPROFEN 600 MG PO TABS: 600.0000 mg | ORAL_TABLET | Freq: Three times a day (TID) | ORAL | 0 refills | Status: DC | PRN

## 2018-04-07 MED ORDER — POTASSIUM CHLORIDE CRYS ER 20 MEQ PO TBCR: 20.0000 meq | EXTENDED_RELEASE_TABLET | Freq: Every day | ORAL | 3 refills | Status: DC

## 2018-04-07 MED ORDER — LOSARTAN POTASSIUM-HCTZ 100-25 MG PO TABS: 1.0000 | ORAL_TABLET | Freq: Every day | ORAL | 3 refills | Status: DC

## 2018-04-07 MED ORDER — CLINDAMYCIN HCL 300 MG PO CAPS: 300.0000 mg | ORAL_CAPSULE | Freq: Four times a day (QID) | ORAL | 0 refills | Status: AC

## 2018-04-07 MED ORDER — OMEPRAZOLE 20 MG PO CPDR: 20.0000 mg | DELAYED_RELEASE_CAPSULE | Freq: Every day | ORAL | 0 refills | Status: DC

## 2018-04-07 MED ORDER — MUPIROCIN 2 % EX OINT: 1.0000 "application " | TOPICAL_OINTMENT | Freq: Two times a day (BID) | CUTANEOUS | 1 refills | Status: AC

## 2018-04-07 MED ORDER — PRAVASTATIN SODIUM 40 MG PO TABS: 40.0000 mg | ORAL_TABLET | Freq: Every day | ORAL | 0 refills | Status: DC

## 2018-04-07 NOTE — Progress Notes (Signed)
Assessment & Plan:  Leah Hampton was seen today for follow-up.  Diagnoses and all orders for this visit:  Essential hypertension -     losartan-hydrochlorothiazide (HYZAAR) 100-25 MG tablet; Take 1 tablet by mouth daily. -     potassium chloride SA (K-DUR,KLOR-CON) 20 MEQ tablet; Take 1 tablet (20 mEq total) by mouth daily. Continue all antihypertensives as prescribed.  Remember to bring in your blood pressure log with you for your follow up appointment.  DASH/Mediterranean Diets are healthier choices for HTN.    Prediabetes -     Glucose (CBG) -     HgB A1c Continue blood sugar control as discussed in office today, low carbohydrate diet, and regular physical exercise as tolerated, 150 minutes per week (30 min each day, 5 days per week, or 50 min 3 days per week).  Annual eye exams and foot exams are recommended.    Gastroesophageal reflux disease, esophagitis presence not specified -     omeprazole (PRILOSEC) 20 MG capsule; Take 1 capsule (20 mg total) by mouth daily. INSTRUCTIONS: Avoid GERD Triggers: acidic, spicy or fried foods, caffeine, coffee, sodas,  alcohol and chocolate.   Mixed hyperlipidemia -     pravastatin (PRAVACHOL) 40 MG tablet; Take 1 tablet (40 mg total) by mouth daily. INSTRUCTIONS: Work on a low fat, heart healthy diet and participate in regular aerobic exercise program by working out at least 150 minutes per week; 5 days a week-30 minutes per day. Avoid red meat, fried foods. junk foods, sodas, sugary drinks, unhealthy snacking, alcohol and smoking.  Drink at least 48oz of water per day and monitor your carbohydrate intake daily.    Skin infection, bacterial -     mupirocin ointment (BACTROBAN) 2 %; Apply 1 application topically 2 (two) times daily for 14 days. -     clindamycin (CLEOCIN) 300 MG capsule; Take 1 capsule (300 mg total) by mouth 4 (four) times daily for 7 days.  Chronic pain of both knees -     ibuprofen (ADVIL,MOTRIN) 600 MG tablet; Take 1 tablet  (600 mg total) by mouth every 8 (eight) hours as needed. Chronic. She is morbidly obese. Pain is worse with standing and bending. Described as sharp and aching.  Work on losing weight to help reduce joint pain. May alternate with heat and ice application for pain relief. May also alternate with acetaminophen and Ibuprofen as prescribed pain relief. Other alternatives include massage, acupuncture and water aerobics.  You must stay active and avoid a sedentary lifestyle.   Patient has been counseled on age-appropriate routine health concerns for screening and prevention. These are reviewed and up-to-date. Referrals have been placed accordingly. Immunizations are up-to-date or declined.    Subjective:   Chief Complaint  Patient presents with  . Follow-up    Pt. is here to follow-up on hypertension.    HPI Leah Hampton 62 y.o. female presents to office today for follow up to HTN,    Essential Hypertension Blood pressure well controlled today.  She endorses medication compliance taking Hyzaar 100-25 mg daily. Denies chest pain, shortness of breath, palpitations, headaches, vomiting or BLE edema. She has lost 9 lbs. Started exercising and made some dietary modifications.  BP Readings from Last 3 Encounters:  04/07/18 103/67  02/08/18 120/63  01/04/18 114/77   Hyperlipidemia Patient presents for follow up to hyperlipidemia.  She is medication compliant taking pravastatin 40mg  daily as prescribed.    She is somewhat diet compliant and denies exertional chest pressure/discomfort, poor  exercise tolerance and skin xanthelasma or statin intolerance including myalgias. LDL not at goal.  Lab Results  Component Value Date   CHOL 171 05/23/2017   Lab Results  Component Value Date   HDL 55 05/23/2017   Lab Results  Component Value Date   LDLCALC 104 (H) 05/23/2017   Lab Results  Component Value Date   TRIG 62 05/23/2017   Lab Results  Component Value Date   CHOLHDL 3.1 05/23/2017     Skin Infection Patient complains of skin ulcer. The ulcer is located underneath the right breast. Ulcer has been present a few months. Pain is rated mild. Interventions to date: Home remedies: topical ointments and solutions. Patient denies tobacco use. Patient does not have a history of diabetes.          Review of Systems  Constitutional: Negative for fever, malaise/fatigue and weight loss.  HENT: Negative.  Negative for nosebleeds.   Eyes: Negative.  Negative for blurred vision, double vision and photophobia.  Respiratory: Negative.  Negative for cough and shortness of breath.   Cardiovascular: Negative.  Negative for chest pain, palpitations and leg swelling.  Gastrointestinal: Positive for heartburn. Negative for abdominal pain, blood in stool, constipation, diarrhea, melena, nausea and vomiting.  Musculoskeletal: Positive for joint pain (chronic bilateral knee pain). Negative for myalgias.  Skin:       SEE HPI  Neurological: Negative.  Negative for dizziness, focal weakness, seizures and headaches.  Psychiatric/Behavioral: Negative.  Negative for suicidal ideas.    Past Medical History:  Diagnosis Date  . Bronchitis   . Cystitis   . Hematuria    urologice eval, Dr Estill Dooms  . History of chest pain   . Hyperbilirubinemia   . Hyperlipemia   . Hypertelorism   . Hypertension   . Leukocytosis   . Obesity   . Osteoarthritis    right knee  . Sleep apnea 2012   slight   . Vitamin D deficiency 02/09/2018    Past Surgical History:  Procedure Laterality Date  . COLONOSCOPY WITH PROPOFOL N/A 03/27/2015   Procedure: COLONOSCOPY WITH PROPOFOL;  Surgeon: Milus Banister, MD;  Location: [facility];  Service: Endoscopy;  Laterality: N/A;  . DILATATION & CURETTAGE/HYSTEROSCOPY WITH MYOSURE N/A 01/05/2017   Procedure: DILATATION & CURETTAGE/HYSTEROSCOPY WITH MYOSURE;  Surgeon: Thurnell Lose, MD;  Location: [facility];  Service: Gynecology;  Laterality: N/A;  PostMenopausal  Bleeding  . KNEE ARTHROSCOPY  04/2007   left knee  . TOTAL KNEE ARTHROPLASTY  10/2008   Dr Roxine Caddy    Family History  Problem Relation Age of Onset  . Heart failure Mother   . Arthritis Mother   . Hypertension Mother   . Stroke Mother   . Heart attack Father 50  . Diabetes Unknown   . Arthritis Unknown   . Stroke Unknown   . Coronary artery disease Unknown   . Heart failure Sister        x 2  . Colon cancer Neg Hx     Social History Reviewed with no changes to be made today.   Outpatient Medications Prior to Visit  Medication Sig Dispense Refill  . Cyanocobalamin (VITAMIN B-12) 1000 MCG/15ML LIQD     . FLOVENT HFA 110 MCG/ACT inhaler INHALE 2 PUFFS INTO THE LUNGS 2 TIMES DAILY 36 g 0  . terbinafine (LAMISIL) 250 MG tablet Take 250 mg by mouth daily.    . valACYclovir (VALTREX) 500 MG tablet Take 1 tablet (500 mg total) by mouth daily.  90 tablet 1  . Vitamin D, Ergocalciferol, (DRISDOL) 50000 units CAPS capsule Take 1 capsule (50,000 Units total) by mouth every 7 (seven) days. 12 capsule 0  . losartan-hydrochlorothiazide (HYZAAR) 100-25 MG tablet Take 1 tablet by mouth daily. 90 tablet 3  . omeprazole (PRILOSEC) 20 MG capsule TAKE 1 CAPSULE EVERY DAY 90 capsule 0  . potassium chloride SA (K-DUR,KLOR-CON) 20 MEQ tablet Take 1 tablet (20 mEq total) by mouth daily. 30 tablet 3  . pravastatin (PRAVACHOL) 40 MG tablet TAKE 1 TABLET EVERY DAY 90 tablet 0  . diclofenac sodium (VOLTAREN) 1 % GEL Apply 2 g topically 4 (four) times daily. Rub into affected area of foot 2 to 4 times daily (Patient not taking: Reported on 04/07/2018) 100 g 2  . Zoster Vaccine Adjuvanted [facility]) injection Inject 0.5mg  IM now and again in 2-6 months. (Patient not taking: Reported on 04/07/2018) 0.5 mL 1   No facility-administered medications prior to visit.     Allergies  Allergen Reactions  . Sulfamethoxazole-Trimethoprim [Bactrim] Itching  . Codeine Nausea And Vomiting and Anxiety        Objective:    BP 103/67 (BP Location: Left Arm, Patient Position: Sitting, Cuff Size: Large)   Pulse 99   Temp 98.2 F (36.8 C) (Oral)   Ht 5\' 4"  (1.626 m)   Wt 297 lb 6.4 oz (134.9 kg)   LMP 11/10/2011   SpO2 95%   BMI 51.05 kg/m  Wt Readings from Last 3 Encounters:  04/07/18 297 lb 6.4 oz (134.9 kg)  02/08/18 299 lb (135.6 kg)  01/04/18 (!) 306 lb 9.6 oz (139.1 kg)    Physical Exam  Constitutional: She is oriented to person, place, and time. She appears well-developed and well-nourished. She is cooperative.  HENT:  Head: Normocephalic and atraumatic.  Eyes: EOM are normal.  Neck: Normal range of motion.  Cardiovascular: Normal rate, regular rhythm, normal heart sounds and intact distal pulses. Exam reveals no gallop and no friction rub.  No murmur heard. Pulmonary/Chest: Effort normal and breath sounds normal. No tachypnea. No respiratory distress. She has no decreased breath sounds. She has no wheezes. She has no rhonchi. She has no rales. She exhibits no tenderness. There is breast discharge. No breast swelling, tenderness or bleeding. Breasts are symmetrical.    Abdominal: Soft. Bowel sounds are normal.  Musculoskeletal: Normal range of motion. She exhibits no edema.  Neurological: She is alert and oriented to person, place, and time. Coordination normal.  Skin: Skin is warm and dry.  Psychiatric: She has a normal mood and affect. Her behavior is normal. Judgment and thought content normal.  Nursing note and vitals reviewed.        Patient has been counseled extensively about nutrition and exercise as well as the importance of adherence with medications and regular follow-up. The patient was given clear instructions to go to ER or return to medical center if symptoms don't improve, worsen or new problems develop. The patient verbalized understanding.   Follow-up: Return in about 2 weeks (around 04/21/2018) for skin infection.   Gildardo Pounds, FNP-BC [facility], [place]   04/07/2018, 11:40 AM

## 2018-04-07 NOTE — Patient Instructions (Signed)
Skin Abscess A skin abscess is an infected area on or under your skin that contains pus and other material. An abscess can happen almost anywhere on your body. Some abscesses break open (rupture) on their own. Most continue to get worse unless they are treated. The infection can spread deeper into the body and into your blood, which can make you feel sick. Treatment usually involves draining the abscess. Follow these instructions at home: Abscess Care  If you have an abscess that has not drained, place a warm, clean, wet washcloth over the abscess several times a day. Do this as told by your doctor.  Follow instructions from your doctor about how to take care of your abscess. Make sure you: ? Cover the abscess with a bandage (dressing). ? Change your bandage or gauze as told by your doctor. ? Wash your hands with soap and water before you change the bandage or gauze. If you cannot use soap and water, use hand sanitizer.  Check your abscess every day for signs that the infection is getting worse. Check for: ? More redness, swelling, or pain. ? More fluid or blood. ? Warmth. ? More pus or a bad smell. Medicines   Take over-the-counter and prescription medicines only as told by your doctor.  If you were prescribed an antibiotic medicine, take it as told by your doctor. Do not stop taking the antibiotic even if you start to feel better. General instructions  To avoid spreading the infection: ? Do not share personal care items, towels, or hot tubs with others. ? Avoid making skin-to-skin contact with other people.  Keep all follow-up visits as told by your doctor. This is important. Contact a doctor if:  You have more redness, swelling, or pain around your abscess.  You have more fluid or blood coming from your abscess.  Your abscess feels warm when you touch it.  You have more pus or a bad smell coming from your abscess.  You have a fever.  Your muscles ache.  You have  chills.  You feel sick. Get help right away if:  You have very bad (severe) pain.  You see red streaks on your skin spreading away from the abscess. This information is not intended to replace advice given to you by your health care provider. Make sure you discuss any questions you have with your health care provider. Document Released: 10/20/2007 Document Revised: 12/28/2015 Document Reviewed: 03/12/2015 Elsevier Interactive Patient Education  2018 [employer]  

## 2018-04-27 ENCOUNTER — Ambulatory Visit: Payer: Medicare HMO | Admitting: Podiatry

## 2018-04-28 ENCOUNTER — Ambulatory Visit: Payer: Medicare HMO | Admitting: Podiatry

## 2018-05-04 ENCOUNTER — Institutional Professional Consult (permissible substitution): Payer: Medicare HMO | Admitting: Pulmonary Disease

## 2018-05-05 ENCOUNTER — Ambulatory Visit: Payer: Medicare HMO | Admitting: Podiatry

## 2018-05-16 ENCOUNTER — Ambulatory Visit (INDEPENDENT_AMBULATORY_CARE_PROVIDER_SITE_OTHER): Payer: Medicare HMO | Admitting: Family

## 2018-05-16 ENCOUNTER — Encounter: Payer: Self-pay | Admitting: Family

## 2018-05-16 VITALS — BP 126/65 | HR 86 | Temp 97.8°F | Resp 16 | Ht 64.0 in | Wt 295.0 lb

## 2018-05-16 DIAGNOSIS — L729 Follicular cyst of the skin and subcutaneous tissue, unspecified: Secondary | ICD-10-CM

## 2018-05-16 DIAGNOSIS — Z0000 Encounter for general adult medical examination without abnormal findings: Secondary | ICD-10-CM | POA: Diagnosis not present

## 2018-05-16 DIAGNOSIS — R319 Hematuria, unspecified: Secondary | ICD-10-CM

## 2018-05-16 DIAGNOSIS — E559 Vitamin D deficiency, unspecified: Secondary | ICD-10-CM | POA: Diagnosis not present

## 2018-05-16 DIAGNOSIS — R739 Hyperglycemia, unspecified: Secondary | ICD-10-CM

## 2018-05-16 DIAGNOSIS — R6889 Other general symptoms and signs: Secondary | ICD-10-CM | POA: Diagnosis not present

## 2018-05-16 LAB — URINALYSIS, ROUTINE W REFLEX MICROSCOPIC
Bilirubin Urine: NEGATIVE
Ketones, ur: NEGATIVE
Nitrite: NEGATIVE
PH: 6.5 (ref 5.0–8.0)
SPECIFIC GRAVITY, URINE: 1.015 (ref 1.000–1.030)
TOTAL PROTEIN, URINE-UPE24: NEGATIVE
UROBILINOGEN UA: 0.2 (ref 0.0–1.0)
Urine Glucose: NEGATIVE

## 2018-05-16 LAB — BASIC METABOLIC PANEL
BUN: 17 mg/dL (ref 6–23)
CHLORIDE: 101 meq/L (ref 96–112)
CO2: 32 meq/L (ref 19–32)
Calcium: 9.3 mg/dL (ref 8.4–10.5)
Creatinine, Ser: 0.98 mg/dL (ref 0.40–1.20)
GFR: 74.29 mL/min (ref >60.00)
Glucose, Bld: 105 mg/dL — ABNORMAL HIGH (ref 70–99)
POTASSIUM: 3.4 meq/L — AB (ref 3.5–5.1)
SODIUM: 141 meq/L (ref 135–145)

## 2018-05-16 LAB — VITAMIN D 25 HYDROXY (VIT D DEFICIENCY, FRACTURES): VITD: 41.04 ng/mL (ref 30.00–100.00)

## 2018-05-16 LAB — HEMOGLOBIN A1C: HEMOGLOBIN A1C: 6.6 % — AB (ref 4.6–6.5)

## 2018-05-16 MED ORDER — VALACYCLOVIR HCL 1 G PO TABS: 1000.0000 mg | ORAL_TABLET | Freq: Every day | ORAL | 1 refills | Status: DC

## 2018-05-16 NOTE — Patient Instructions (Signed)
Please complete lab work prior to leaving. Ask pharmacist to give you the shingles vaccine.  Schedule routine eye exam. Add regular exercise such as recumbant bike or water aerobics.

## 2018-05-16 NOTE — Progress Notes (Signed)
Subjective:    Patient ID: Leah Hampton, female    DOB: [birth year], 62 y.o.   MRN: [id number]  HPI  Patient presents today for complete physical.  Immunizations:  Flu shot up to date. Shingrix due- she will get at her pharmacy Diet:  Needs improvement Exercise:  No  Colonoscopy:03/27/15 Dexa: 01/12/17 Pap Smear: 7/18 Mammogram:  11/09/17 Vision:  A few years ago Dental:  Up to date  Reports that she had outbreak of herpes while on valtrex 500mg .  Wt Readings from Last 3 Encounters:  05/16/18 295 lb (133.8 kg)  04/07/18 297 lb 6.4 oz (134.9 kg)  02/08/18 299 lb (135.6 kg)     Review of Systems  Constitutional: Negative for unexpected weight change.  HENT: Negative for hearing loss and rhinorrhea.   Eyes: Negative for visual disturbance.  Respiratory: Negative for cough.   Cardiovascular: Negative for leg swelling.  Gastrointestinal: Negative for constipation and diarrhea.  Genitourinary: Negative for dysuria, frequency and vaginal discharge.       Notes that she saw some blood on tissue last week after urination  Musculoskeletal:       Chronic knee pain/sometimes her hips bother her  Neurological: Negative for headaches.  Hematological: Negative for adenopathy.  Psychiatric/Behavioral:       Denies depression/anxiety       Past Medical History:  Diagnosis Date  . Bronchitis   . Cystitis   . Hematuria    urologice eval, Dr Estill Dooms  . History of chest pain   . Hyperbilirubinemia   . Hyperlipemia   . Hypertelorism   . Hypertension   . Leukocytosis   . Obesity   . Osteoarthritis    right knee  . Sleep apnea 2012   slight   . Vitamin D deficiency 02/09/2018     Social History   Socioeconomic History  . Marital status: Single    Spouse name: Not on file  . Number of children: 2  . Years of education: Not on file  . Highest education level: Not on file  Occupational History  . Occupation: disabled  Social Needs  . Financial resource strain: Not on  file  . Food insecurity:    Worry: Sometimes true    Inability: Sometimes true  . Transportation needs:    Medical: No    Non-medical: No  Tobacco Use  . Smoking status: Never Smoker  . Smokeless tobacco: Never Used  Substance and Sexual Activity  . Alcohol use: No  . Drug use: No  . Sexual activity: Not Currently  Lifestyle  . Physical activity:    Days per week: 3 days    Minutes per session: 60 min  . Stress: Not on file  Relationships  . Social connections:    Talks on phone: More than three times a week    Gets together: Three times a week    Attends religious service: More than 4 times per year    Active member of club or organization: Yes    Attends meetings of clubs or organizations: More than 4 times per year    Relationship status: Married  . Intimate partner violence:    Fear of current or ex partner: Not on file    Emotionally abused: Not on file    Physically abused: Not on file    Forced sexual activity: Not on file  Other Topics Concern  . Not on file  Social History Narrative   Regular exercise:  No  Lives alone   Has 2 grown sons, both local.  1 grandson   On disability due to her knee pain   Completed 12th grade   Enjoys television   No pets.     Past Surgical History:  Procedure Laterality Date  . COLONOSCOPY WITH PROPOFOL N/A 03/27/2015   Procedure: COLONOSCOPY WITH PROPOFOL;  Surgeon: Milus Banister, MD;  Location: [facility];  Service: Endoscopy;  Laterality: N/A;  . DILATATION & CURETTAGE/HYSTEROSCOPY WITH MYOSURE N/A 01/05/2017   Procedure: DILATATION & CURETTAGE/HYSTEROSCOPY WITH MYOSURE;  Surgeon: Thurnell Lose, MD;  Location: [facility];  Service: Gynecology;  Laterality: N/A;  PostMenopausal Bleeding  . KNEE ARTHROSCOPY  04/2007   left knee  . TOTAL KNEE ARTHROPLASTY  10/2008   Dr Roxine Caddy    Family History  Problem Relation Age of Onset  . Heart failure Mother   . Arthritis Mother   . Hypertension Mother   . Stroke Mother     . Heart attack Father 32  . Diabetes Unknown   . Arthritis Unknown   . Stroke Unknown   . Coronary artery disease Unknown   . Heart failure Sister        x 2  . Colon cancer Neg Hx     Allergies  Allergen Reactions  . Sulfamethoxazole-Trimethoprim [Bactrim] Itching  . Codeine Nausea And Vomiting and Anxiety    Current Outpatient Medications on File Prior to Visit  Medication Sig Dispense Refill  . Cyanocobalamin (VITAMIN B-12) 1000 MCG/15ML LIQD     . diclofenac sodium (VOLTAREN) 1 % GEL Apply 2 g topically 4 (four) times daily. Rub into affected area of foot 2 to 4 times daily (Patient not taking: Reported on 04/07/2018) 100 g 2  . FLOVENT HFA 110 MCG/ACT inhaler INHALE 2 PUFFS INTO THE LUNGS 2 TIMES DAILY 36 g 0  . ibuprofen (ADVIL,MOTRIN) 600 MG tablet Take 1 tablet (600 mg total) by mouth every 8 (eight) hours as needed. 30 tablet 0  . losartan-hydrochlorothiazide (HYZAAR) 100-25 MG tablet Take 1 tablet by mouth daily. 90 tablet 3  . omeprazole (PRILOSEC) 20 MG capsule Take 1 capsule (20 mg total) by mouth daily. 90 capsule 0  . potassium chloride SA (K-DUR,KLOR-CON) 20 MEQ tablet Take 1 tablet (20 mEq total) by mouth daily. 30 tablet 3  . pravastatin (PRAVACHOL) 40 MG tablet Take 1 tablet (40 mg total) by mouth daily. 90 tablet 0  . terbinafine (LAMISIL) 250 MG tablet Take 250 mg by mouth daily.    . Vitamin D, Ergocalciferol, (DRISDOL) 50000 units CAPS capsule Take 1 capsule (50,000 Units total) by mouth every 7 (seven) days. 12 capsule 0   No current facility-administered medications on file prior to visit.     BP 126/65 (BP Location: Right Arm, Patient Position: Sitting, Cuff Size: Large)   Pulse 86   Temp 97.8 F (36.6 C) (Oral)   Resp 16   Ht 5\' 4"  (1.626 m)   Wt 295 lb (133.8 kg)   LMP 11/10/2011   SpO2 99%   BMI 50.64 kg/m    Objective:   Physical Exam Physical Exam  Constitutional: She is oriented to person, place, and time. She appears well-developed  and well-nourished. No distress.  HENT:  Head: Normocephalic and atraumatic.  Right Ear: Tympanic membrane and ear canal normal.  Left Ear: Tympanic membrane and ear canal normal.  Mouth/Throat: Oropharynx is clear and moist.  Eyes: Pupils are equal, round, and reactive to light. No scleral icterus.  Neck:  Normal range of motion. No thyromegaly present.  Cardiovascular: Normal rate and regular rhythm.   No murmur heard. Pulmonary/Chest: Effort normal and breath sounds normal. No respiratory distress. He has no wheezes. She has no rales. She exhibits no tenderness.  Abdominal: Soft. Bowel sounds are normal. She exhibits no distension and no mass. There is no tenderness. There is no rebound and no guarding.  Musculoskeletal: She exhibits no edema.  Lymphadenopathy:    She has no cervical adenopathy.  Neurological: She is alert and oriented to person, place, and time. She has normal patellar reflexes. She exhibits normal muscle tone. Coordination normal.  Skin: Skin is warm and dry.  Psychiatric: She has a normal mood and affect. Her behavior is normal. Judgment and thought content normal.  Breasts: Examined lying Right: Without masses, retractions, discharge or axillary adenopathy.  Left: Without masses, retractions, discharge or axillary adenopathy. (some superficial pimple like cysts noted left lateral breast) Pelvic: deferred          Assessment & Plan:   Preventative care- discussed healthy diet, exercise, weight loss. Obtain routine lab work.  Recommended shingrix which she will get from her pharmacist for insurance purposes. EKG tracing is personally reviewed.  EKG notes NSR.  No acute changes.    Hematuria- last week, will check UA and culture.   Hyperglycemia- check A1C.    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 Will increase to 1000mg  once daily.  Skin cysts- recommended warm compresses prn.       Assessment & Plan:

## 2018-05-17 LAB — URINE CULTURE
MICRO NUMBER:: 91556525
SPECIMEN QUALITY:: ADEQUATE

## 2018-05-18 ENCOUNTER — Encounter: Payer: Self-pay | Admitting: Family

## 2018-05-18 ENCOUNTER — Telehealth: Payer: Self-pay | Admitting: Family

## 2018-05-18 ENCOUNTER — Other Ambulatory Visit: Payer: Self-pay

## 2018-05-18 DIAGNOSIS — E119 Type 2 diabetes mellitus without complications: Secondary | ICD-10-CM | POA: Insufficient documentation

## 2018-05-18 NOTE — Telephone Encounter (Signed)
Actually, if you want to schedule an OV in 1 week we can do her repeat bmet at that time. It looks like pneumovax is up to date.

## 2018-05-18 NOTE — Telephone Encounter (Signed)
Potassium is low. Please add kdur 10meq once daily. Repeat bmet in 1 week.  Sugar has worsened and is now in the diabetes range. Please work on healthy diet, avoiding concentrated sweets and limiting white fluffy carbs.   Let's bring her back in for an appointment to do some more diabetes teaching/pneumovax

## 2018-05-18 NOTE — Telephone Encounter (Signed)
Patient scheduled to come in 05-26-2018, she is already on kdur 20 meq

## 2018-05-19 ENCOUNTER — Ambulatory Visit: Payer: Medicare HMO | Admitting: Nurse Practitioner

## 2018-05-19 NOTE — Telephone Encounter (Signed)
Per Lenna Sciara, patient advised to take kdur 43meq twice today and go back to once a day until visit next week.

## 2018-05-26 ENCOUNTER — Ambulatory Visit (INDEPENDENT_AMBULATORY_CARE_PROVIDER_SITE_OTHER): Payer: Medicare HMO | Admitting: Family

## 2018-05-26 ENCOUNTER — Encounter: Payer: Self-pay | Admitting: Family

## 2018-05-26 VITALS — BP 129/58 | HR 81 | Temp 98.0°F | Resp 16 | Ht 64.0 in | Wt 297.0 lb

## 2018-05-26 DIAGNOSIS — E785 Hyperlipidemia, unspecified: Secondary | ICD-10-CM

## 2018-05-26 DIAGNOSIS — E875 Hyperkalemia: Secondary | ICD-10-CM | POA: Diagnosis not present

## 2018-05-26 DIAGNOSIS — R6889 Other general symptoms and signs: Secondary | ICD-10-CM | POA: Diagnosis not present

## 2018-05-26 DIAGNOSIS — I10 Essential (primary) hypertension: Secondary | ICD-10-CM

## 2018-05-26 DIAGNOSIS — E559 Vitamin D deficiency, unspecified: Secondary | ICD-10-CM

## 2018-05-26 DIAGNOSIS — E118 Type 2 diabetes mellitus with unspecified complications: Secondary | ICD-10-CM

## 2018-05-26 LAB — LIPID PANEL
Cholesterol: 196 mg/dL (ref 0–200)
HDL: 47.5 mg/dL (ref >39.00)
LDL Cholesterol: 131 mg/dL — ABNORMAL HIGH (ref 0–99)
NonHDL: 148.46
Total CHOL/HDL Ratio: 4
Triglycerides: 85 mg/dL (ref 0.0–149.0)
VLDL: 17 mg/dL (ref 0.0–40.0)

## 2018-05-26 LAB — BASIC METABOLIC PANEL
BUN: 19 mg/dL (ref 6–23)
CO2: 30 mEq/L (ref 19–32)
CREATININE: 0.95 mg/dL (ref 0.40–1.20)
Calcium: 9.3 mg/dL (ref 8.4–10.5)
Chloride: 103 mEq/L (ref 96–112)
GFR: 77 mL/min (ref >60.00)
Glucose, Bld: 99 mg/dL (ref 70–99)
Potassium: 3.9 mEq/L (ref 3.5–5.1)
Sodium: 141 mEq/L (ref 135–145)

## 2018-05-26 MED ORDER — VITAMIN D3 75 MCG (3000 UT) PO TABS: 1.0000 | ORAL_TABLET | Freq: Every day | ORAL | Status: DC

## 2018-05-26 NOTE — Progress Notes (Signed)
Subjective:    Patient ID: Leah Hampton, female    DOB: 09/04/1955, 63 y.o.   MRN: [id number]  HPI  Patient is a 63 yr old female who presents today for follow up.  Hyperglycemia-  Reports that she has been trying to bake instead of frying and eating more salads. Has cut back on her sweets.  Lab Results  Component Value Date   HGBA1C 6.6 (H) 05/16/2018   Hyperkalemia-  Recently restarted on kdur.   HTN- maintained on hyzaar 100-25mg .   BP Readings from Last 3 Encounters:  05/26/18 (!) 129/58  05/16/18 126/65  04/07/18 103/67   GERD- continues omeprazole.  Reports that symptoms are generally controlled  Review of Systems    see HPI  Past Medical History:  Diagnosis Date  . Bronchitis   . Cystitis   . Diabetes type 2, controlled ([place])   . Hematuria    urologice eval, Dr Estill Dooms  . History of chest pain   . Hyperbilirubinemia   . Hyperlipemia   . Hypertelorism   . Hypertension   . Leukocytosis   . Obesity   . Osteoarthritis    right knee  . Sleep apnea 2012   slight   . Vitamin D deficiency 02/09/2018     Social History   Socioeconomic History  . Marital status: Single    Spouse name: Not on file  . Number of children: 2  . Years of education: Not on file  . Highest education level: Not on file  Occupational History  . Occupation: disabled  Social Needs  . Financial resource strain: Not on file  . Food insecurity:    Worry: Sometimes true    Inability: Sometimes true  . Transportation needs:    Medical: No    Non-medical: No  Tobacco Use  . Smoking status: Never Smoker  . Smokeless tobacco: Never Used  Substance and Sexual Activity  . Alcohol use: No  . Drug use: No  . Sexual activity: Not Currently  Lifestyle  . Physical activity:    Days per week: 3 days    Minutes per session: 60 min  . Stress: Not on file  Relationships  . Social connections:    Talks on phone: More than three times a week    Gets together: Three times a week   Attends religious service: More than 4 times per year    Active member of club or organization: Yes    Attends meetings of clubs or organizations: More than 4 times per year    Relationship status: Married  . Intimate partner violence:    Fear of current or ex partner: Not on file    Emotionally abused: Not on file    Physically abused: Not on file    Forced sexual activity: Not on file  Other Topics Concern  . Not on file  Social History Narrative   Regular exercise:  No      Lives alone   Has 2 grown sons, both local.  1 grandson   On disability due to her knee pain   Completed 12th grade   Enjoys television   No pets.     Past Surgical History:  Procedure Laterality Date  . COLONOSCOPY WITH PROPOFOL N/A 03/27/2015   Procedure: COLONOSCOPY WITH PROPOFOL;  Surgeon: Milus Banister, MD;  Location: [facility];  Service: Endoscopy;  Laterality: N/A;  . DILATATION & CURETTAGE/HYSTEROSCOPY WITH MYOSURE N/A 01/05/2017   Procedure: DILATATION & CURETTAGE/HYSTEROSCOPY WITH MYOSURE;  Surgeon: Thurnell Lose, MD;  Location: [facility];  Service: Gynecology;  Laterality: N/A;  PostMenopausal Bleeding  . KNEE ARTHROSCOPY  04/2007   left knee  . TOTAL KNEE ARTHROPLASTY  10/2008   Dr Roxine Caddy    Family History  Problem Relation Age of Onset  . Heart failure Mother   . Arthritis Mother   . Hypertension Mother   . Stroke Mother   . Heart attack Father 59  . Diabetes Other   . Arthritis Other   . Stroke Other   . Coronary artery disease Other   . Heart failure Sister        x 2  . Colon cancer Neg Hx     Allergies  Allergen Reactions  . Sulfamethoxazole-Trimethoprim [Bactrim] Itching  . Codeine Nausea And Vomiting and Anxiety    Current Outpatient Medications on File Prior to Visit  Medication Sig Dispense Refill  . Cyanocobalamin (VITAMIN B-12) 1000 MCG/15ML LIQD     . diclofenac sodium (VOLTAREN) 1 % GEL Apply 2 g topically 4 (four) times daily. Rub into affected area of foot  2 to 4 times daily (Patient not taking: Reported on 04/07/2018) 100 g 2  . FLOVENT HFA 110 MCG/ACT inhaler INHALE 2 PUFFS INTO THE LUNGS 2 TIMES DAILY 36 g 0  . ibuprofen (ADVIL,MOTRIN) 600 MG tablet Take 1 tablet (600 mg total) by mouth every 8 (eight) hours as needed. 30 tablet 0  . losartan-hydrochlorothiazide (HYZAAR) 100-25 MG tablet Take 1 tablet by mouth daily. 90 tablet 3  . omeprazole (PRILOSEC) 20 MG capsule Take 1 capsule (20 mg total) by mouth daily. 90 capsule 0  . potassium chloride SA (K-DUR,KLOR-CON) 20 MEQ tablet Take 1 tablet (20 mEq total) by mouth daily. 30 tablet 3  . pravastatin (PRAVACHOL) 40 MG tablet Take 1 tablet (40 mg total) by mouth daily. 90 tablet 0  . terbinafine (LAMISIL) 250 MG tablet Take 250 mg by mouth daily.    . valACYclovir (VALTREX) 1000 MG tablet Take 1 tablet (1,000 mg total) by mouth daily. 90 tablet 1  . Vitamin D, Ergocalciferol, (DRISDOL) 50000 units CAPS capsule Take 1 capsule (50,000 Units total) by mouth every 7 (seven) days. 12 capsule 0   No current facility-administered medications on file prior to visit.     BP (!) 129/58 (BP Location: Right Arm, Patient Position: Sitting, Cuff Size: Large)   Pulse 81   Temp 98 F (36.7 C) (Oral)   Resp 16   Ht 5\' 4"  (1.626 m)   Wt 297 lb (134.7 kg)   LMP 11/10/2011   SpO2 100%   BMI 50.98 kg/m    Objective:   Physical Exam Constitutional:      Appearance: She is well-developed.  Neck:     Musculoskeletal: Neck supple.     Thyroid: No thyromegaly.  Cardiovascular:     Rate and Rhythm: Normal rate and regular rhythm.     Heart sounds: Normal heart sounds. No murmur.  Pulmonary:     Effort: Pulmonary effort is normal. No respiratory distress.     Breath sounds: Normal breath sounds. No wheezing.  Skin:    General: Skin is warm and dry.  Neurological:     Mental Status: She is alert and oriented to person, place, and time.  Psychiatric:        Behavior: Behavior normal.        Thought  Content: Thought content normal.        Judgment: Judgment normal.  Assessment & Plan:  DM2- discussed diet/exercise and weight loss. Refer to nutrition and ophthalmology.  Lab Results  Component Value Date   CHOL 171 05/23/2017   HDL 55 05/23/2017   LDLCALC 104 (H) 05/23/2017   TRIG 62 05/23/2017   CHOLHDL 3.1 05/23/2017   Vit d deficiency- completed 12 week course of 43200 units. Will check follow up level and pt is advised to begin otc vit D.  HTN- bp stable on current meds.   Hyperlipidemia- check follow up lipid panel.

## 2018-05-26 NOTE — Patient Instructions (Addendum)
Try to focus diet on non-starchy vegetables (salads)  Bake or broil your meats/fishes. Limit portions of carbohydrates. Add regular walking to help you lose weight and lower your sugar.

## 2018-05-30 LAB — VITAMIN D 1,25 DIHYDROXY
VITAMIN D 1, 25 (OH) TOTAL: 62 pg/mL (ref 18–72)
Vitamin D2 1, 25 (OH)2: 40 pg/mL
Vitamin D3 1, 25 (OH)2: 22 pg/mL

## 2018-06-01 ENCOUNTER — Encounter: Payer: Self-pay | Admitting: Family

## 2018-06-08 ENCOUNTER — Ambulatory Visit: Payer: Medicare HMO

## 2018-06-08 ENCOUNTER — Telehealth: Payer: Self-pay

## 2018-06-08 ENCOUNTER — Institutional Professional Consult (permissible substitution): Payer: Medicare HMO | Admitting: Pulmonary Disease

## 2018-06-08 NOTE — Telephone Encounter (Signed)
Copied from [place] [phone]. Topic: General - Other >> Jun 02, 2018  8:32 AM Oneta Rack wrote: Relation to pt: self  Call back number: [phone]   Reason for call:  Patient states diabetic education referral placed with [facility] is not in network. [employer] advised patient of 2 in network facilities  [facility]  [phone] /  [address] [place]  [phone], please advise when referral is placed

## 2018-06-08 NOTE — Telephone Encounter (Signed)
I sent the referral to [address] for [facility] [employer] [facility], pt would also like a call back states she never received her results from her last labs. Pt states she does not use my chart and would perfer to be called .

## 2018-06-12 ENCOUNTER — Other Ambulatory Visit: Payer: Self-pay | Admitting: Nurse Practitioner

## 2018-06-12 DIAGNOSIS — I10 Essential (primary) hypertension: Secondary | ICD-10-CM

## 2018-06-15 ENCOUNTER — Ambulatory Visit: Payer: Medicare HMO

## 2018-06-15 ENCOUNTER — Telehealth: Payer: Self-pay | Admitting: Family

## 2018-06-15 DIAGNOSIS — I10 Essential (primary) hypertension: Secondary | ICD-10-CM

## 2018-06-15 MED ORDER — POTASSIUM CHLORIDE CRYS ER 20 MEQ PO TBCR: 20.0000 meq | EXTENDED_RELEASE_TABLET | Freq: Every day | ORAL | 1 refills | Status: DC

## 2018-06-15 NOTE — Telephone Encounter (Signed)
Copied from [place] [id number]. Topic: Quick Communication - Rx Refill/Question >> Jun 15, 2018  2:15 PM Percell Belt A wrote: Medication: potassium chloride SA (K-DUR,KLOR-CON) 20 MEQ tablet [Pharmacy Med Name: POTASSIUM CHLORIDE ER   Has the patient contacted their pharmacy? Yes it was sent to wrong provider (Agent: If no, request that the patient contact the pharmacy for the refill.) (Agent: If yes, when and what did the pharmacy advise?) Preferred Pharmacy (with phone number or street name): [place], [place] [phone] (Phone)    Agent: Please be advised that RX refills may take up to 3 business days. We ask that you follow-up with your pharmacy.

## 2018-06-15 NOTE — Addendum Note (Signed)
Addended by: Debbrah Alar on: 06/15/2018 04:31 PM   Modules accepted: Orders

## 2018-06-15 NOTE — Telephone Encounter (Signed)
Contacted pt regarding request for potassium chloride; she says that she no longer sees Geryl Rankins at [facility]; pt had appointment for physical exam with Debbrah Alar on 05/16/18, and last seen in office 05/26/2018; will route to office for final disposition.   Requested medication (s) are due for refill today: ?  Requested medication (s) are on the active medication list: yes  Last refill:  04/07/18 by Geryl Rankins  Future visit scheduled: yes   Notes to clinic:  Original prescription written by Geryl Rankins, NP, [facility]

## 2018-06-15 NOTE — Telephone Encounter (Signed)
Rx has been sent to [facility]. I sent to [facility] as well. Could you please cance at [facility] and notify pt that rx was sent?

## 2018-06-20 ENCOUNTER — Other Ambulatory Visit: Payer: Self-pay | Admitting: Nurse Practitioner

## 2018-06-20 DIAGNOSIS — R6889 Other general symptoms and signs: Secondary | ICD-10-CM | POA: Diagnosis not present

## 2018-06-20 DIAGNOSIS — H5203 Hypermetropia, bilateral: Secondary | ICD-10-CM | POA: Diagnosis not present

## 2018-06-20 DIAGNOSIS — H40053 Ocular hypertension, bilateral: Secondary | ICD-10-CM | POA: Diagnosis not present

## 2018-06-20 DIAGNOSIS — H40013 Open angle with borderline findings, low risk, bilateral: Secondary | ICD-10-CM | POA: Diagnosis not present

## 2018-06-20 DIAGNOSIS — H524 Presbyopia: Secondary | ICD-10-CM | POA: Diagnosis not present

## 2018-06-20 LAB — HM DIABETES EYE EXAM

## 2018-06-21 ENCOUNTER — Telehealth: Payer: Self-pay | Admitting: *Deleted

## 2018-06-21 NOTE — Telephone Encounter (Signed)
Patient received medication today.   

## 2018-06-21 NOTE — Telephone Encounter (Signed)
Received Diabetic Eye Exam Report from [facility]; forwarded to provider/SLS 02/05

## 2018-06-22 ENCOUNTER — Ambulatory Visit: Payer: Medicare HMO

## 2018-06-26 ENCOUNTER — Other Ambulatory Visit: Payer: Self-pay | Admitting: Family

## 2018-06-26 NOTE — Telephone Encounter (Signed)
Copied from [place] [id number]. Topic: Quick Communication - Rx Refill/Question >> Jun 26, 2018 10:46 AM Antonieta Iba C wrote: Medication: FLOVENT HFA 110 MCG/ACT inhaler - 90 day supply.   Has the patient contacted their pharmacy? Yes  (Agent: If no, request that the patient contact the pharmacy for the refill.) (Agent: If yes, when and what did the pharmacy advise?)  Preferred Pharmacy (with phone number or street name): [place], [place] [phone] (Phone) [phone] (Fax)    Agent: Please be advised that RX refills may take up to 3 business days. We ask that you follow-up with your pharmacy.

## 2018-06-26 NOTE — Telephone Encounter (Signed)
Requested medication (s) are due for refill today: yes  Requested medication (s) are on the active medication list: yes  Last refill:  04/06/18  Future visit scheduled: yes  Notes to clinic:  Called pt and pt stated that she is no longer going to [place] [facility] who was previously prescribed  And refill the medication. Pt stated she will be seeing only Debbrah Alar NP.    Requested Prescriptions  Pending Prescriptions Disp Refills   fluticasone (FLOVENT HFA) 110 MCG/ACT inhaler 36 g 0     Pulmonology:  Corticosteroids Passed - 06/26/2018 10:57 AM      Passed - Valid encounter within last 12 months    Recent Outpatient Visits          1 month ago Essential hypertension   [facility], NP   1 month ago Annual physical exam   [facility], NP   4 months ago OSA (obstructive sleep apnea)   [facility], NP      Future Appointments            In 1 month Debbrah Alar, NP [facility], [place]   In 4 months Debbrah Alar, NP [facility], [place]

## 2018-06-26 NOTE — Telephone Encounter (Signed)
Notes to clinic:  Called pt and pt stated that she is no longer going to [place] [place] [facility] who was previously prescribed fluticasone  And refill the medication. Pt stated she will be seeing only Debbrah Alar NP.

## 2018-06-27 MED ORDER — FLUTICASONE PROPIONATE HFA 110 MCG/ACT IN AERO: INHALATION_SPRAY | RESPIRATORY_TRACT | 0 refills | Status: DC

## 2018-07-13 ENCOUNTER — Institutional Professional Consult (permissible substitution): Payer: Medicare HMO | Admitting: Pulmonary Disease

## 2018-07-20 ENCOUNTER — Encounter: Payer: Self-pay | Admitting: Podiatry

## 2018-07-20 ENCOUNTER — Ambulatory Visit: Payer: Medicare HMO | Admitting: Podiatry

## 2018-07-20 DIAGNOSIS — Z79899 Other long term (current) drug therapy: Secondary | ICD-10-CM | POA: Diagnosis not present

## 2018-07-20 DIAGNOSIS — B353 Tinea pedis: Secondary | ICD-10-CM | POA: Diagnosis not present

## 2018-07-20 DIAGNOSIS — R6889 Other general symptoms and signs: Secondary | ICD-10-CM | POA: Diagnosis not present

## 2018-07-20 DIAGNOSIS — B351 Tinea unguium: Secondary | ICD-10-CM | POA: Diagnosis not present

## 2018-07-20 DIAGNOSIS — M898X9 Other specified disorders of bone, unspecified site: Secondary | ICD-10-CM

## 2018-07-20 MED ORDER — CLOTRIMAZOLE-BETAMETHASONE 1-0.05 % EX CREA: 1.0000 "application " | TOPICAL_CREAM | Freq: Two times a day (BID) | CUTANEOUS | 2 refills | Status: DC

## 2018-07-20 MED ORDER — DICLOFENAC SODIUM 1 % TD GEL: 2.0000 g | Freq: Four times a day (QID) | TRANSDERMAL | 2 refills | Status: DC

## 2018-07-20 NOTE — Patient Instructions (Signed)

## 2018-07-20 NOTE — Progress Notes (Signed)
Subjective:  Leah Hampton presents the office today follow-up evaluation of nail fungus as well as athlete's foot.  She states the Lamisil helped quite a bit.  Over the last couple months though since that medication she started getting recurrent itching in between her big and second toe.  She states the nails are still thickened discolored to be growing out some.  She can tell a difference but started the medication.  Presents today for refill of medications. Denies any systemic complaints such as fevers, chills, nausea, vomiting. No acute changes since last appointment, and no other complaints at this time.   Objective: AAO x3, NAD DP/PT pulses palpable bilaterally, CRT less than 3 seconds Nails are hypertrophic, dystrophic with yellow-brown discoloration on the distal portion.  There is clearing on the proximal half of the nail.  There is no pain in the nails there is no surrounding redness or drainage or any signs of infection.  There is erythema to skin on the first interspace bilaterally there is no pustules or open sores or any drainage. The pain to the top of the foot is doing well. She is asking for a refill of Voltaren gel as it occasionally flares up.  No open lesions or pre-ulcerative lesions.  No pain with calf compression, swelling, warmth, erythema  Assessment: Onychomycosis, tinea pedis; exostosis  Plan: -All treatment options discussed with the patient including all alternatives, risks, complications.  -As a courtesy debrided the nails x10 without any complications or bleeding.  She had good improvement from the Lamisil but the nails overall still has fungus and she wants another round.  We will check a LFT and CBC prior to starting medication discussed side effects of the medication.  Once normal will order 30 days of the medicine and see her back in 4 weeks. -Refilled Lotrisone as well as Voltaren gel. -Patient encouraged to call the office with any questions, concerns, change in  symptoms.   Trula Slade DPM

## 2018-08-02 ENCOUNTER — Other Ambulatory Visit: Payer: Self-pay | Admitting: Nurse Practitioner

## 2018-08-02 DIAGNOSIS — K219 Gastro-esophageal reflux disease without esophagitis: Secondary | ICD-10-CM

## 2018-08-02 DIAGNOSIS — E782 Mixed hyperlipidemia: Secondary | ICD-10-CM

## 2018-08-15 ENCOUNTER — Other Ambulatory Visit: Payer: Self-pay | Admitting: Podiatry

## 2018-08-15 ENCOUNTER — Telehealth: Payer: Self-pay | Admitting: Podiatry

## 2018-08-15 MED ORDER — CLOTRIMAZOLE-BETAMETHASONE 1-0.05 % EX CREA: 1.0000 "application " | TOPICAL_CREAM | Freq: Two times a day (BID) | CUTANEOUS | 2 refills | Status: DC

## 2018-08-15 NOTE — Addendum Note (Signed)
Addended by: Harriett Sine D on: 08/15/2018 02:13 PM   Modules accepted: Orders

## 2018-08-15 NOTE — Telephone Encounter (Signed)
I informed pt of the refill and to make an appt if continued to have problems.

## 2018-08-15 NOTE — Telephone Encounter (Signed)
Pt is requesting a refill on a topical cream for the bottom of her foot .

## 2018-08-16 ENCOUNTER — Ambulatory Visit: Payer: Medicare HMO | Admitting: Family

## 2018-08-18 ENCOUNTER — Other Ambulatory Visit: Payer: Self-pay | Admitting: *Deleted

## 2018-08-18 DIAGNOSIS — K219 Gastro-esophageal reflux disease without esophagitis: Secondary | ICD-10-CM

## 2018-08-18 DIAGNOSIS — E782 Mixed hyperlipidemia: Secondary | ICD-10-CM

## 2018-08-18 DIAGNOSIS — Z79899 Other long term (current) drug therapy: Secondary | ICD-10-CM | POA: Diagnosis not present

## 2018-08-18 MED ORDER — PRAVASTATIN SODIUM 40 MG PO TABS: 40.0000 mg | ORAL_TABLET | Freq: Every day | ORAL | 1 refills | Status: DC

## 2018-08-18 MED ORDER — OMEPRAZOLE 20 MG PO CPDR: 20.0000 mg | DELAYED_RELEASE_CAPSULE | Freq: Every day | ORAL | 1 refills | Status: DC

## 2018-08-19 LAB — HEPATIC FUNCTION PANEL
AG Ratio: 1.3 (calc) (ref 1.0–2.5)
ALBUMIN MSPROF: 3.9 g/dL (ref 3.6–5.1)
ALT: 15 U/L (ref 6–29)
AST: 20 U/L (ref 10–35)
Alkaline phosphatase (APISO): 68 U/L (ref 37–153)
BILIRUBIN DIRECT: 0.1 mg/dL (ref 0.0–0.2)
Globulin: 3 g/dL (calc) (ref 1.9–3.7)
Indirect Bilirubin: 0.2 mg/dL (calc) (ref 0.2–1.2)
Total Bilirubin: 0.3 mg/dL (ref 0.2–1.2)
Total Protein: 6.9 g/dL (ref 6.1–8.1)

## 2018-08-19 LAB — CBC WITH DIFFERENTIAL/PLATELET
Absolute Monocytes: 562 cells/uL (ref 200–950)
BASOS PCT: 0.4 %
Basophils Absolute: 42 cells/uL (ref 0–200)
Eosinophils Absolute: 117 cells/uL (ref 15–500)
Eosinophils Relative: 1.1 %
HCT: 37.5 % (ref 35.0–45.0)
Hemoglobin: 12.4 g/dL (ref 11.7–15.5)
Lymphs Abs: 4007 cells/uL — ABNORMAL HIGH (ref 850–3900)
MCH: 28.8 pg (ref 27.0–33.0)
MCHC: 33.1 g/dL (ref 32.0–36.0)
MCV: 87.2 fL (ref 80.0–100.0)
MPV: 10.2 fL (ref 7.5–12.5)
Monocytes Relative: 5.3 %
Neutro Abs: 5872 cells/uL (ref 1500–7800)
Neutrophils Relative %: 55.4 %
Platelets: 308 10*3/uL (ref 140–400)
RBC: 4.3 10*6/uL (ref 3.80–5.10)
RDW: 14.9 % (ref 11.0–15.0)
Total Lymphocyte: 37.8 %
WBC: 10.6 10*3/uL (ref 3.8–10.8)

## 2018-08-22 ENCOUNTER — Telehealth: Payer: Self-pay | Admitting: *Deleted

## 2018-08-22 NOTE — Telephone Encounter (Signed)
Called and spoke with the patient and Per Dr Jacqualyn Posey the patient's blood work was normal and that the patient can start taken the lamisil and Dr Jacqualyn Posey would like to see the patient back in a month and can do a telephone call if the patient would like and patient agreed and I stated to call the office if any concerns or questions at [phone]. Leah Hampton

## 2018-08-24 ENCOUNTER — Telehealth: Payer: Self-pay | Admitting: *Deleted

## 2018-08-24 ENCOUNTER — Other Ambulatory Visit: Payer: Self-pay | Admitting: Podiatry

## 2018-08-24 MED ORDER — TERBINAFINE HCL 250 MG PO TABS: 250.0000 mg | ORAL_TABLET | Freq: Every day | ORAL | 0 refills | Status: DC

## 2018-08-24 NOTE — Telephone Encounter (Signed)
Called and tried to leave a message for the patient and the patient's voice mail was not set up and just wanted to let her know that Dr Jacqualyn Posey called in the [place] to [facility] and that we had an appointment set up for May 5th, 2020 at 11:30 am vitural call. Lattie Haw

## 2018-08-28 ENCOUNTER — Other Ambulatory Visit: Payer: Self-pay | Admitting: Family

## 2018-08-31 ENCOUNTER — Ambulatory Visit: Payer: Medicare HMO | Admitting: Podiatry

## 2018-09-19 ENCOUNTER — Ambulatory Visit: Payer: Medicare HMO | Admitting: Podiatry

## 2018-10-04 ENCOUNTER — Other Ambulatory Visit: Payer: Self-pay | Admitting: Family

## 2018-10-04 DIAGNOSIS — Z1231 Encounter for screening mammogram for malignant neoplasm of breast: Secondary | ICD-10-CM

## 2018-10-11 ENCOUNTER — Other Ambulatory Visit: Payer: Self-pay | Admitting: Family

## 2018-10-16 ENCOUNTER — Other Ambulatory Visit: Payer: Self-pay

## 2018-10-16 ENCOUNTER — Ambulatory Visit (INDEPENDENT_AMBULATORY_CARE_PROVIDER_SITE_OTHER): Payer: Medicare HMO | Admitting: Family

## 2018-10-16 DIAGNOSIS — E782 Mixed hyperlipidemia: Secondary | ICD-10-CM

## 2018-10-16 DIAGNOSIS — M25561 Pain in right knee: Secondary | ICD-10-CM

## 2018-10-16 DIAGNOSIS — E119 Type 2 diabetes mellitus without complications: Secondary | ICD-10-CM

## 2018-10-16 DIAGNOSIS — E559 Vitamin D deficiency, unspecified: Secondary | ICD-10-CM

## 2018-10-16 DIAGNOSIS — R109 Unspecified abdominal pain: Secondary | ICD-10-CM | POA: Diagnosis not present

## 2018-10-16 DIAGNOSIS — G8929 Other chronic pain: Secondary | ICD-10-CM | POA: Diagnosis not present

## 2018-10-16 DIAGNOSIS — I10 Essential (primary) hypertension: Secondary | ICD-10-CM

## 2018-10-16 DIAGNOSIS — K219 Gastro-esophageal reflux disease without esophagitis: Secondary | ICD-10-CM

## 2018-10-16 NOTE — Progress Notes (Signed)
Virtual Visit via Video Note  I connected with Leah Hampton on 10/16/18 at 12:00 PM EDT by a video enabled telemedicine application and verified that I am speaking with the correct person using two identifiers. This visit type was conducted due to national recommendations for restrictions regarding the COVID-19 Pandemic (e.g. social distancing).  This format is felt to be most appropriate for this patient at this time.   I discussed the limitations of evaluation and management by telemedicine and the availability of in person appointments. The patient expressed understanding and agreed to proceed.  Only the patient and myself were on today's video visit. The patient was at home and I was at home at the time of today's visit.   History of Present Illness:   Patient is a 63 yr old female who presents today for follow up.  DM2- not checking sugars at home. She plans to pick up a glucose meter however.  Lab Results  Component Value Date   HGBA1C 6.6 (H) 05/16/2018   HGBA1C 5.9 (A) 04/07/2018   HGBA1C 6.1 06/22/2016   Lab Results  Component Value Date   MICROALBUR 0.97 07/13/2011   LDLCALC 131 (H) 05/26/2018   CREATININE 0.95 05/26/2018   HTN- not checking her bp at home. Does not have a machine. Also taking potassium daily.  BP Readings from Last 3 Encounters:  05/26/18 (!) 129/58  05/16/18 126/65  04/07/18 103/67   Vit d deficiency- was treated with a 12 week course of vitamin D last fall.   GERD- reports that she continues omeprazole. Reports symptoms are stable.   Hyperlipidemia-Continues pravastatin.   Reports that she has some intermittent lower abdominal discomfort.  Symptoms are mild.  Reports some increased constipation recently.  R knee pain- has been bothering her "for a while."  She is s/p left TKA. Dr. Flossie Dibble.   Observations/Objective:   Gen: Awake, alert, no acute distress Resp: Breathing is even and non-labored Psych: calm/pleasant demeanor Neuro: Alert  and Oriented x 3, + facial symmetry, speech is clear.    Assessment and Plan:  Right knee pain- will refer to orthopedics for further evaluation.  HTN- clinically stable. Continue home meds. Plan to bring her into the office in 2 months for bp check and follow up lab work.   Vit D deficiency- completed 12 week course of vit D. Recommended that she begin otc supplement 3000 iu once daily.  Abdominal discomfort- recommended that she begin daily fiber supplement. If abdominal discomfort worsens or fails to improve she is advised to schedule an in office visit.  GERD- stable on PPI. Continue same.  Hyperlipidemia- lipids stable. Continue statin.   Lab Results  Component Value Date   CHOL 196 05/26/2018   HDL 47.50 05/26/2018   LDLCALC 131 (H) 05/26/2018   TRIG 85.0 05/26/2018   CHOLHDL 4 05/26/2018     Follow Up Instructions:    I discussed the assessment and treatment plan with the patient. The patient was provided an opportunity to ask questions and all were answered. The patient agreed with the plan and demonstrated an understanding of the instructions.   The patient was advised to call back or seek an in-person evaluation if the symptoms worsen or if the condition fails to improve as anticipated.    [name], NP

## 2018-10-17 ENCOUNTER — Telehealth: Payer: Self-pay

## 2018-10-17 DIAGNOSIS — E119 Type 2 diabetes mellitus without complications: Secondary | ICD-10-CM

## 2018-10-17 NOTE — Telephone Encounter (Signed)
Copied from [place] [id number]. Topic: General - Inquiry >> Oct 17, 2018 11:37 AM Scherrie Gerlach wrote: Reason for CRM: pt states [employer] advised her that Lenna Sciara had cancelled the order for her to have a glucose machine ans supplies. Pt states [employer] told her she qualified and she wants to know why order cancelled.

## 2018-10-18 ENCOUNTER — Ambulatory Visit: Payer: Medicare HMO | Admitting: Family

## 2018-10-18 MED ORDER — BLOOD GLUCOSE MONITOR KIT: PACK | 0 refills | Status: DC

## 2018-10-18 NOTE — Telephone Encounter (Signed)
rx faxed, patient advised order was never cancelled but a new one has been faxed to them.

## 2018-10-18 NOTE — Telephone Encounter (Signed)
I did not cancel the order for her glucometer. Not sure why her insurance told her that. Please advise patient that  I have printed the rx for you to fax please to her pharmacy.

## 2018-10-18 NOTE — Addendum Note (Signed)
Addended by: Debbrah Alar on: 10/18/2018 10:18 AM   Modules accepted: Orders

## 2018-10-24 ENCOUNTER — Ambulatory Visit: Payer: Medicare HMO | Admitting: Podiatry

## 2018-11-06 ENCOUNTER — Ambulatory Visit: Payer: Medicare HMO | Admitting: Podiatry

## 2018-11-06 ENCOUNTER — Other Ambulatory Visit: Payer: Self-pay

## 2018-11-06 DIAGNOSIS — Z79899 Other long term (current) drug therapy: Secondary | ICD-10-CM

## 2018-11-06 DIAGNOSIS — B351 Tinea unguium: Secondary | ICD-10-CM

## 2018-11-08 NOTE — Progress Notes (Signed)
Subjective: 63 year old female presents the office today after starting Lamisil.  She states that she is over 2 months in the treatment she has noticed improvement with her nails.  No pain in the nails there is no redness or drainage.  She is having no side effects from medication.Denies any systemic complaints such as fevers, chills, nausea, vomiting. No acute changes since last appointment, and no other complaints at this time.   Objective: AAO x3, NAD DP/PT pulses palpable bilaterally, CRT less than 3 seconds There is debris clearing on the proximal nail border.  The nails debrided growing out healthier.  There is no pain of the nails there is no surrounding redness or drainage or any signs of infection.  Still some yellow-brown discoloration distally but overall improved. No open lesions or pre-ulcerative lesions.  No pain with calf compression, swelling, warmth, erythema  Assessment: Onychomycosis, currently on Lamisil without side effect  Plan: -All treatment options discussed with the patient including all alternatives, risks, complications.  -We recheck a CBC and LFT.  There is a course of Lamisil.  As a courtesy debride the nails x10 without any complications or bleeding.  Discussed measures to help prevent reinfection. -Patient encouraged to call the office with any questions, concerns, change in symptoms.   Trula Slade DPM

## 2018-11-10 DIAGNOSIS — Z79899 Other long term (current) drug therapy: Secondary | ICD-10-CM | POA: Diagnosis not present

## 2018-11-11 LAB — CBC WITH DIFFERENTIAL/PLATELET
Absolute Monocytes: 593 cells/uL (ref 200–950)
Basophils Absolute: 31 cells/uL (ref 0–200)
Basophils Relative: 0.3 %
Eosinophils Absolute: 125 cells/uL (ref 15–500)
Eosinophils Relative: 1.2 %
HCT: 37.7 % (ref 35.0–45.0)
Hemoglobin: 12.2 g/dL (ref 11.7–15.5)
Lymphs Abs: 3973 cells/uL — ABNORMAL HIGH (ref 850–3900)
MCH: 28.9 pg (ref 27.0–33.0)
MCHC: 32.4 g/dL (ref 32.0–36.0)
MCV: 89.3 fL (ref 80.0–100.0)
MPV: 10.3 fL (ref 7.5–12.5)
Monocytes Relative: 5.7 %
Neutro Abs: 5678 cells/uL (ref 1500–7800)
Neutrophils Relative %: 54.6 %
Platelets: 317 10*3/uL (ref 140–400)
RBC: 4.22 10*6/uL (ref 3.80–5.10)
RDW: 14.6 % (ref 11.0–15.0)
Total Lymphocyte: 38.2 %
WBC: 10.4 10*3/uL (ref 3.8–10.8)

## 2018-11-11 LAB — HEPATIC FUNCTION PANEL
AG Ratio: 1.2 (calc) (ref 1.0–2.5)
ALT: 13 U/L (ref 6–29)
AST: 17 U/L (ref 10–35)
Albumin: 3.9 g/dL (ref 3.6–5.1)
Alkaline phosphatase (APISO): 73 U/L (ref 37–153)
Bilirubin, Direct: 0.1 mg/dL (ref 0.0–0.2)
Globulin: 3.2 g/dL (calc) (ref 1.9–3.7)
Indirect Bilirubin: 0.2 mg/dL (calc) (ref 0.2–1.2)
Total Bilirubin: 0.3 mg/dL (ref 0.2–1.2)
Total Protein: 7.1 g/dL (ref 6.1–8.1)

## 2018-11-14 ENCOUNTER — Ambulatory Visit: Payer: Medicare HMO | Admitting: Family

## 2018-11-15 NOTE — Progress Notes (Signed)
Called and relayed message to the patient and stated to call the [place] office if any concerns or questions. Leah Hampton

## 2018-11-24 ENCOUNTER — Ambulatory Visit
Admission: RE | Admit: 2018-11-24 | Discharge: 2018-11-24 | Disposition: A | Discharge status: Home / Self Care | Payer: Medicare HMO | Source: Ambulatory Visit | Attending: Family | Admitting: Family

## 2018-11-24 ENCOUNTER — Other Ambulatory Visit: Payer: Self-pay

## 2018-11-24 DIAGNOSIS — Z1231 Encounter for screening mammogram for malignant neoplasm of breast: Secondary | ICD-10-CM | POA: Diagnosis not present

## 2018-12-06 ENCOUNTER — Other Ambulatory Visit: Payer: Self-pay | Admitting: Podiatry

## 2018-12-06 ENCOUNTER — Other Ambulatory Visit: Payer: Self-pay | Admitting: Family

## 2018-12-13 ENCOUNTER — Telehealth: Payer: Self-pay | Admitting: *Deleted

## 2018-12-13 NOTE — Telephone Encounter (Signed)
Per Dr Jacqualyn Posey, the patient has already got the required amount and does not need to take or get any more lamisil. Leah Hampton

## 2018-12-18 ENCOUNTER — Telehealth: Payer: Self-pay | Admitting: Family

## 2018-12-18 ENCOUNTER — Ambulatory Visit (INDEPENDENT_AMBULATORY_CARE_PROVIDER_SITE_OTHER): Payer: Medicare HMO | Admitting: Family

## 2018-12-18 ENCOUNTER — Other Ambulatory Visit: Payer: Self-pay

## 2018-12-18 DIAGNOSIS — E785 Hyperlipidemia, unspecified: Secondary | ICD-10-CM

## 2018-12-18 DIAGNOSIS — K219 Gastro-esophageal reflux disease without esophagitis: Secondary | ICD-10-CM | POA: Diagnosis not present

## 2018-12-18 DIAGNOSIS — A6000 Herpesviral infection of urogenital system, unspecified: Secondary | ICD-10-CM | POA: Diagnosis not present

## 2018-12-18 DIAGNOSIS — E782 Mixed hyperlipidemia: Secondary | ICD-10-CM | POA: Diagnosis not present

## 2018-12-18 DIAGNOSIS — E119 Type 2 diabetes mellitus without complications: Secondary | ICD-10-CM

## 2018-12-18 DIAGNOSIS — I10 Essential (primary) hypertension: Secondary | ICD-10-CM

## 2018-12-18 DIAGNOSIS — E559 Vitamin D deficiency, unspecified: Secondary | ICD-10-CM

## 2018-12-18 DIAGNOSIS — R5383 Other fatigue: Secondary | ICD-10-CM

## 2018-12-18 DIAGNOSIS — E538 Deficiency of other specified B group vitamins: Secondary | ICD-10-CM | POA: Diagnosis not present

## 2018-12-18 MED ORDER — POTASSIUM CHLORIDE CRYS ER 20 MEQ PO TBCR: 20.0000 meq | EXTENDED_RELEASE_TABLET | Freq: Every day | ORAL | 1 refills | Status: DC

## 2018-12-18 MED ORDER — OMEPRAZOLE 20 MG PO CPDR: 20.0000 mg | DELAYED_RELEASE_CAPSULE | Freq: Every day | ORAL | 1 refills | Status: DC | PRN

## 2018-12-18 MED ORDER — PRAVASTATIN SODIUM 40 MG PO TABS: 40.0000 mg | ORAL_TABLET | Freq: Every day | ORAL | 1 refills | Status: DC

## 2018-12-18 NOTE — Progress Notes (Signed)
Virtual Visit via Video Note  I connected with [name] on 12/18/18 at 11:00 AM EDT by a video enabled telemedicine application and verified that I am speaking with the correct person using two identifiers.  Location: Patient: home Provider: home   I discussed the limitations of evaluation and management by telemedicine and the availability of in person appointments. The patient expressed understanding and agreed to proceed.  History of Present Illness:  She reports + fatigue.  Wonders if it is because she is not working.    DM2- not checking sugars regularly.  Lab Results  Component Value Date   HGBA1C 6.6 (H) 05/16/2018   HGBA1C 5.9 (A) 04/07/2018   HGBA1C 6.1 06/22/2016   Lab Results  Component Value Date   MICROALBUR 0.97 07/13/2011   LDLCALC 131 (H) 05/26/2018   CREATININE 0.95 05/26/2018   Hyperlipidemia- continues pravastatin. Reports she is trying to eat healthier.   HTN- continues hyzaar and Kdur.  BP Readings from Last 3 Encounters:  05/26/18 (!) 129/58  05/16/18 126/65  04/07/18 103/67   GERD- denies any gerd symptoms.   HSV- continues daily valtrex. Reports that she has had "a few" breakouts.   Past Medical History:  Diagnosis Date  . Bronchitis   . Cystitis   . Diabetes type 2, controlled ([place])   . Hematuria    urologice eval, Dr Estill Dooms  . History of chest pain   . Hyperbilirubinemia   . Hyperlipemia   . Hypertelorism   . Hypertension   . Leukocytosis   . Obesity   . Osteoarthritis    right knee  . Sleep apnea 2012   slight   . Vitamin D deficiency 02/09/2018     Social History   Socioeconomic History  . Marital status: Single    Spouse name: Not on file  . Number of children: 2  . Years of education: Not on file  . Highest education level: Not on file  Occupational History  . Occupation: disabled  Social Needs  . Financial resource strain: Not on file  . Food insecurity    Worry: Sometimes true    Inability: Sometimes true   . Transportation needs    Medical: No    Non-medical: No  Tobacco Use  . Smoking status: Never Smoker  . Smokeless tobacco: Never Used  Substance and Sexual Activity  . Alcohol use: No  . Drug use: No  . Sexual activity: Not Currently  Lifestyle  . Physical activity    Days per week: 3 days    Minutes per session: 60 min  . Stress: Not on file  Relationships  . Social connections    Talks on phone: More than three times a week    Gets together: Three times a week    Attends religious service: More than 4 times per year    Active member of club or organization: Yes    Attends meetings of clubs or organizations: More than 4 times per year    Relationship status: Married  . Intimate partner violence    Fear of current or ex partner: Not on file    Emotionally abused: Not on file    Physically abused: Not on file    Forced sexual activity: Not on file  Other Topics Concern  . Not on file  Social History Narrative   Regular exercise:  No      Lives alone   Has 2 grown sons, both local.  1 grandson  On disability due to her knee pain   Completed 12th grade   Enjoys television   No pets.     Past Surgical History:  Procedure Laterality Date  . COLONOSCOPY WITH PROPOFOL N/A 03/27/2015   Procedure: COLONOSCOPY WITH PROPOFOL;  Surgeon: Milus Banister, MD;  Location: [facility];  Service: Endoscopy;  Laterality: N/A;  . DILATATION & CURETTAGE/HYSTEROSCOPY WITH MYOSURE N/A 01/05/2017   Procedure: DILATATION & CURETTAGE/HYSTEROSCOPY WITH MYOSURE;  Surgeon: Thurnell Lose, MD;  Location: [facility];  Service: Gynecology;  Laterality: N/A;  PostMenopausal Bleeding  . KNEE ARTHROSCOPY  04/2007   left knee  . TOTAL KNEE ARTHROPLASTY  10/2008   Dr Roxine Caddy    Family History  Problem Relation Age of Onset  . Heart failure Mother   . Arthritis Mother   . Hypertension Mother   . Stroke Mother   . Heart attack Father 29  . Diabetes Other   . Arthritis Other   . Stroke Other    . Coronary artery disease Other   . Heart failure Sister        x 2  . Colon cancer Neg Hx     Allergies  Allergen Reactions  . Sulfamethoxazole-Trimethoprim [Bactrim] Itching  . Codeine Nausea And Vomiting and Anxiety    Current Outpatient Medications on File Prior to Visit  Medication Sig Dispense Refill  . blood glucose meter kit and supplies KIT Dispense based on patient and insurance preference. Use up to four times daily as directed. (FOR ICD-9 250.00, 250.01). 1 each 0  . Cholecalciferol (VITAMIN D3) 75 MCG (3000 UT) TABS Take 1 tablet by mouth daily. 30 tablet   . clotrimazole-betamethasone (LOTRISONE) cream Apply 1 application topically 2 (two) times daily. 30 g 2  . Cyanocobalamin (VITAMIN B-12) 1000 MCG/15ML LIQD     . fluticasone (FLOVENT HFA) 110 MCG/ACT inhaler INHALE 2 PUFFS TWICE DAILY 36 g 1  . ibuprofen (ADVIL,MOTRIN) 600 MG tablet Take 1 tablet (600 mg total) by mouth every 8 (eight) hours as needed. 30 tablet 0  . losartan-hydrochlorothiazide (HYZAAR) 100-25 MG tablet Take 1 tablet by mouth daily. 90 tablet 3  . valACYclovir (VALTREX) 1000 MG tablet TAKE 1 TABLET (1,000 MG TOTAL) BY MOUTH DAILY. 90 tablet 1   No current facility-administered medications on file prior to visit.     LMP 11/10/2011    Observations/Objective:   Gen: Awake, alert, no acute distress Resp: Breathing is even and non-labored Psych: calm/pleasant demeanor Neuro: Alert and Oriented x 3, + facial symmetry, speech is clear.   Assessment and Plan:  DM2- clinically stable. Continue current meds. Obtain a1c.  HTN- no recent blood pressure. Will schedule nurse visit for blood pressure check and lab work.  Hyperlipidemia- check follow up lipid panel.  GERD- no gerd symptoms. Recommended that she change omeprazole to prn.   HSV- fair control with daily valtrex, continue same.   Fatigue- obtain cbc and tsh. Follow up on split night study order.   Follow Up Instructions:    I  discussed the assessment and treatment plan with the patient. The patient was provided an opportunity to ask questions and all were answered. The patient agreed with the plan and demonstrated an understanding of the instructions.   The patient was advised to call back or seek an in-person evaluation if the symptoms worsen or if the condition fails to improve as anticipated.  [name], NP

## 2018-12-18 NOTE — Telephone Encounter (Signed)
Did pt ever go to the sleep lab last fall to complete sleep study? If not, I will reschedule as this could be contributing to her fatigue.

## 2018-12-19 NOTE — Telephone Encounter (Signed)
Noted  

## 2018-12-28 ENCOUNTER — Ambulatory Visit (INDEPENDENT_AMBULATORY_CARE_PROVIDER_SITE_OTHER): Payer: Medicare HMO | Admitting: Family Medicine

## 2018-12-28 ENCOUNTER — Other Ambulatory Visit: Payer: Self-pay

## 2018-12-28 ENCOUNTER — Other Ambulatory Visit (INDEPENDENT_AMBULATORY_CARE_PROVIDER_SITE_OTHER): Payer: Medicare HMO

## 2018-12-28 DIAGNOSIS — E118 Type 2 diabetes mellitus with unspecified complications: Secondary | ICD-10-CM

## 2018-12-28 DIAGNOSIS — E782 Mixed hyperlipidemia: Secondary | ICD-10-CM | POA: Diagnosis not present

## 2018-12-28 DIAGNOSIS — E538 Deficiency of other specified B group vitamins: Secondary | ICD-10-CM

## 2018-12-28 DIAGNOSIS — E119 Type 2 diabetes mellitus without complications: Secondary | ICD-10-CM | POA: Diagnosis not present

## 2018-12-28 DIAGNOSIS — R5383 Other fatigue: Secondary | ICD-10-CM | POA: Diagnosis not present

## 2018-12-28 DIAGNOSIS — I10 Essential (primary) hypertension: Secondary | ICD-10-CM | POA: Diagnosis not present

## 2018-12-28 DIAGNOSIS — E559 Vitamin D deficiency, unspecified: Secondary | ICD-10-CM | POA: Diagnosis not present

## 2018-12-28 DIAGNOSIS — R6889 Other general symptoms and signs: Secondary | ICD-10-CM | POA: Diagnosis not present

## 2018-12-28 LAB — CBC WITH DIFFERENTIAL/PLATELET
Basophils Absolute: 0.1 10*3/uL (ref 0.0–0.1)
Basophils Relative: 0.8 % (ref 0.0–3.0)
Eosinophils Absolute: 0.1 10*3/uL (ref 0.0–0.7)
Eosinophils Relative: 1.7 % (ref 0.0–5.0)
HCT: 37.2 % (ref 36.0–46.0)
Hemoglobin: 12.1 g/dL (ref 12.0–15.0)
Lymphocytes Relative: 41.6 % (ref 12.0–46.0)
Lymphs Abs: 3.5 10*3/uL (ref 0.7–4.0)
MCHC: 32.4 g/dL (ref 30.0–36.0)
MCV: 88.8 fl (ref 78.0–100.0)
Monocytes Absolute: 0.6 10*3/uL (ref 0.1–1.0)
Monocytes Relative: 7.3 % (ref 3.0–12.0)
Neutro Abs: 4.1 10*3/uL (ref 1.4–7.7)
Neutrophils Relative %: 48.6 % (ref 43.0–77.0)
Platelets: 289 10*3/uL (ref 150.0–400.0)
RBC: 4.18 Mil/uL (ref 3.87–5.11)
RDW: 16.1 % — ABNORMAL HIGH (ref 11.5–15.5)
WBC: 8.4 10*3/uL (ref 4.0–10.5)

## 2018-12-28 LAB — LIPID PANEL
Cholesterol: 187 mg/dL (ref 0–200)
HDL: 49.9 mg/dL (ref >39.00)
LDL Cholesterol: 122 mg/dL — ABNORMAL HIGH (ref 0–99)
NonHDL: 137.52
Total CHOL/HDL Ratio: 4
Triglycerides: 80 mg/dL (ref 0.0–149.0)
VLDL: 16 mg/dL (ref 0.0–40.0)

## 2018-12-28 LAB — BASIC METABOLIC PANEL
BUN: 28 mg/dL — ABNORMAL HIGH (ref 6–23)
CO2: 29 mEq/L (ref 19–32)
Calcium: 9.3 mg/dL (ref 8.4–10.5)
Chloride: 104 mEq/L (ref 96–112)
Creatinine, Ser: 1.17 mg/dL (ref 0.40–1.20)
GFR: 56.86 mL/min — ABNORMAL LOW (ref >60.00)
Glucose, Bld: 97 mg/dL (ref 70–99)
Potassium: 3.8 mEq/L (ref 3.5–5.1)
Sodium: 141 mEq/L (ref 135–145)

## 2018-12-28 LAB — HEPATIC FUNCTION PANEL
ALT: 18 U/L (ref 0–35)
AST: 25 U/L (ref 0–37)
Albumin: 3.9 g/dL (ref 3.5–5.2)
Alkaline Phosphatase: 67 U/L (ref 39–117)
Bilirubin, Direct: 0.1 mg/dL (ref 0.0–0.3)
Total Bilirubin: 0.3 mg/dL (ref 0.2–1.2)
Total Protein: 7.1 g/dL (ref 6.0–8.3)

## 2018-12-28 LAB — HEMOGLOBIN A1C: Hgb A1c MFr Bld: 6.5 % (ref 4.6–6.5)

## 2018-12-28 LAB — VITAMIN B12: Vitamin B-12: 892 pg/mL (ref 211–911)

## 2018-12-28 LAB — TSH: TSH: 4.12 u[IU]/mL (ref 0.35–4.50)

## 2018-12-28 NOTE — Progress Notes (Addendum)
Pt here for Blood pressure check per Debbrah Alar  Pt currently takes:Hyzaar 100-25mg  one tablet daily   Pt reports compliance with medication.  BP today @ =125/82 HR =76  Pt advised per Dr. Charlett Blake, no changes and follow up with [facility] as scheduled.  BP Readings from Last 3 Encounters:  05/26/18 (!) 129/58  05/16/18 126/65  04/07/18 103/67   Patient also requested me to teach her how to record her glucose on her monitor. This took several minutes as her machine was not working properly and she left the manual at home by accident.   Routed to DOD in absence of PCP.  nursing note reviewed. Agree with documention and plan.

## 2019-01-02 LAB — VITAMIN D 1,25 DIHYDROXY
Vitamin D 1, 25 (OH)2 Total: 57 pg/mL (ref 18–72)
Vitamin D2 1, 25 (OH)2: <8 pg/mL
Vitamin D3 1, 25 (OH)2: 57 pg/mL

## 2019-01-03 ENCOUNTER — Encounter: Payer: Self-pay | Admitting: Family

## 2019-01-03 NOTE — Progress Notes (Signed)
Mailed out to pt 

## 2019-02-28 ENCOUNTER — Other Ambulatory Visit: Payer: Self-pay

## 2019-03-02 ENCOUNTER — Ambulatory Visit (INDEPENDENT_AMBULATORY_CARE_PROVIDER_SITE_OTHER): Payer: Medicare HMO | Admitting: Family

## 2019-03-02 ENCOUNTER — Encounter: Payer: Self-pay | Admitting: Family

## 2019-03-02 ENCOUNTER — Other Ambulatory Visit: Payer: Self-pay

## 2019-03-02 VITALS — BP 113/71 | HR 86 | Temp 97.0°F | Resp 16 | Ht 65.0 in | Wt 308.0 lb

## 2019-03-02 DIAGNOSIS — R6889 Other general symptoms and signs: Secondary | ICD-10-CM | POA: Diagnosis not present

## 2019-03-02 DIAGNOSIS — R10819 Abdominal tenderness, unspecified site: Secondary | ICD-10-CM

## 2019-03-02 DIAGNOSIS — R0789 Other chest pain: Secondary | ICD-10-CM

## 2019-03-02 LAB — CBC WITH DIFFERENTIAL/PLATELET
Basophils Absolute: 0 10*3/uL (ref 0.0–0.1)
Basophils Relative: 0.3 % (ref 0.0–3.0)
Eosinophils Absolute: 0.1 10*3/uL (ref 0.0–0.7)
Eosinophils Relative: 1 % (ref 0.0–5.0)
HCT: 37.6 % (ref 36.0–46.0)
Hemoglobin: 12.4 g/dL (ref 12.0–15.0)
Lymphocytes Relative: 40.9 % (ref 12.0–46.0)
Lymphs Abs: 3.8 10*3/uL (ref 0.7–4.0)
MCHC: 32.9 g/dL (ref 30.0–36.0)
MCV: 87.8 fl (ref 78.0–100.0)
Monocytes Absolute: 0.6 10*3/uL (ref 0.1–1.0)
Monocytes Relative: 6 % (ref 3.0–12.0)
Neutro Abs: 4.8 10*3/uL (ref 1.4–7.7)
Neutrophils Relative %: 51.8 % (ref 43.0–77.0)
Platelets: 305 10*3/uL (ref 150.0–400.0)
RBC: 4.28 Mil/uL (ref 3.87–5.11)
RDW: 15.7 % — ABNORMAL HIGH (ref 11.5–15.5)
WBC: 9.3 10*3/uL (ref 4.0–10.5)

## 2019-03-02 MED ORDER — MELOXICAM 7.5 MG PO TABS: 7.5000 mg | ORAL_TABLET | Freq: Every day | ORAL | 0 refills | Status: DC

## 2019-03-02 NOTE — Progress Notes (Signed)
Subjective:    Patient ID: Leah Hampton, female    DOB: 1956-02-02, 63 y.o.   MRN: [id number]  HPI  Patient is a 63 yr old female who presents today with chief complaint of left sided chest pain above her breast. Reports that she first noticed the pain several months ago. Reports sometimes the pain radiates to the right side.  Pain is not worsened by exertion.  Pain is not worsened by taking a deep breath. Area is tender to palpation sometimes.    Notes occasional abdominal discomfort.  Unable to really describe further, "stomach just feels funny." denies nausea.     Review of Systems    see HPI  Past Medical History:  Diagnosis Date  . Bronchitis   . Cystitis   . Diabetes type 2, controlled ([place])   . Hematuria    urologice eval, Dr Estill Dooms  . History of chest pain   . Hyperbilirubinemia   . Hyperlipemia   . Hypertelorism   . Hypertension   . Leukocytosis   . Obesity   . Osteoarthritis    right knee  . Sleep apnea 2012   slight   . Vitamin D deficiency 02/09/2018     Social History   Socioeconomic History  . Marital status: Single    Spouse name: Not on file  . Number of children: 2  . Years of education: Not on file  . Highest education level: Not on file  Occupational History  . Occupation: disabled  Social Needs  . Financial resource strain: Not on file  . Food insecurity    Worry: Sometimes true    Inability: Sometimes true  . Transportation needs    Medical: No    Non-medical: No  Tobacco Use  . Smoking status: Never Smoker  . Smokeless tobacco: Never Used  Substance and Sexual Activity  . Alcohol use: No  . Drug use: No  . Sexual activity: Not Currently  Lifestyle  . Physical activity    Days per week: 3 days    Minutes per session: 60 min  . Stress: Not on file  Relationships  . Social connections    Talks on phone: More than three times a week    Gets together: Three times a week    Attends religious service: More than 4 times per year     Active member of club or organization: Yes    Attends meetings of clubs or organizations: More than 4 times per year    Relationship status: Married  . Intimate partner violence    Fear of current or ex partner: Not on file    Emotionally abused: Not on file    Physically abused: Not on file    Forced sexual activity: Not on file  Other Topics Concern  . Not on file  Social History Narrative   Regular exercise:  No      Lives alone   Has 2 grown sons, both local.  1 grandson   On disability due to her knee pain   Completed 12th grade   Enjoys television   No pets.     Past Surgical History:  Procedure Laterality Date  . COLONOSCOPY WITH PROPOFOL N/A 03/27/2015   Procedure: COLONOSCOPY WITH PROPOFOL;  Surgeon: Milus Banister, MD;  Location: [facility];  Service: Endoscopy;  Laterality: N/A;  . DILATATION & CURETTAGE/HYSTEROSCOPY WITH MYOSURE N/A 01/05/2017   Procedure: DILATATION & CURETTAGE/HYSTEROSCOPY WITH MYOSURE;  Surgeon: Thurnell Lose, MD;  Location: [facility];  Service: Gynecology;  Laterality: N/A;  PostMenopausal Bleeding  . KNEE ARTHROSCOPY  04/2007   left knee  . TOTAL KNEE ARTHROPLASTY  10/2008   Dr Roxine Caddy    Family History  Problem Relation Age of Onset  . Heart failure Mother   . Arthritis Mother   . Hypertension Mother   . Stroke Mother   . Heart attack Father 78  . Diabetes Other   . Arthritis Other   . Stroke Other   . Coronary artery disease Other   . Heart failure Sister        x 2  . Colon cancer Neg Hx     Allergies  Allergen Reactions  . Sulfamethoxazole-Trimethoprim [Bactrim] Itching  . Codeine Nausea And Vomiting and Anxiety    Current Outpatient Medications on File Prior to Visit  Medication Sig Dispense Refill  . blood glucose meter kit and supplies KIT Dispense based on patient and insurance preference. Use up to four times daily as directed. (FOR ICD-9 250.00, 250.01). 1 each 0  . Cholecalciferol (VITAMIN D3) 75 MCG (3000 UT)  TABS Take 1 tablet by mouth daily. 30 tablet   . clotrimazole-betamethasone (LOTRISONE) cream Apply 1 application topically 2 (two) times daily. 30 g 2  . Cyanocobalamin (VITAMIN B-12) 1000 MCG/15ML LIQD     . fluticasone (FLOVENT HFA) 110 MCG/ACT inhaler INHALE 2 PUFFS TWICE DAILY 36 g 1  . ibuprofen (ADVIL,MOTRIN) 600 MG tablet Take 1 tablet (600 mg total) by mouth every 8 (eight) hours as needed. 30 tablet 0  . losartan-hydrochlorothiazide (HYZAAR) 100-25 MG tablet Take 1 tablet by mouth daily. 90 tablet 3  . omeprazole (PRILOSEC) 20 MG capsule Take 1 capsule (20 mg total) by mouth daily as needed. 90 capsule 1  . potassium chloride SA (K-DUR) 20 MEQ tablet Take 1 tablet (20 mEq total) by mouth daily. 90 tablet 1  . pravastatin (PRAVACHOL) 40 MG tablet Take 1 tablet (40 mg total) by mouth daily. 90 tablet 1  . valACYclovir (VALTREX) 1000 MG tablet TAKE 1 TABLET (1,000 MG TOTAL) BY MOUTH DAILY. 90 tablet 1   No current facility-administered medications on file prior to visit.     BP 113/71 (BP Location: Right Arm, Patient Position: Sitting, Cuff Size: Large)   Pulse 86   Temp (!) 97 F (36.1 C) (Temporal)   Resp 16   Ht 5' 5"  (1.651 m)   Wt (!) 308 lb (139.7 kg)   LMP 11/10/2011   SpO2 98%   BMI 51.25 kg/m    Objective:   Physical Exam Constitutional:      Appearance: She is well-developed.  Neck:     Musculoskeletal: Neck supple.     Thyroid: No thyromegaly.  Cardiovascular:     Rate and Rhythm: Normal rate and regular rhythm.     Heart sounds: Normal heart sounds. No murmur.  Pulmonary:     Effort: Pulmonary effort is normal. No respiratory distress.     Breath sounds: Normal breath sounds. No wheezing.  Abdominal:     Comments: Abdomen is protuberant and soft with large pannus limiting exam.  Very mild right sided abdominal pain without guarding. + normoactive bowel sounds.   Musculoskeletal:       Arms:     Comments: + reproducible chest pain on left upper anterior  chest wall  Skin:    General: Skin is warm and dry.  Neurological:     Mental Status: She is alert and oriented to person,  place, and time.  Psychiatric:        Behavior: Behavior normal.        Thought Content: Thought content normal.        Judgment: Judgment normal.           Assessment & Plan:  Musculoskeletal chest pain- will rx with meloxicam trial.   Abdominal discomfort- mild.  Will obtain cbc. If pain worsens or if elevated WBC plan to order CT scan to further evaluate. Otherwise, plan a follow up in 1 week for reassessment.

## 2019-03-02 NOTE — Patient Instructions (Signed)
Please complete lab work prior to leaving. Call if abdominal pain worsens or if not resolved in 1 week. Continue to work on weight loss.  Begin meloxicam once daily for chest wall pain.

## 2019-03-05 ENCOUNTER — Encounter: Payer: Self-pay | Admitting: Family

## 2019-03-05 NOTE — Progress Notes (Signed)
Mailed out to pt 

## 2019-03-08 ENCOUNTER — Other Ambulatory Visit: Payer: Self-pay

## 2019-03-09 ENCOUNTER — Ambulatory Visit (INDEPENDENT_AMBULATORY_CARE_PROVIDER_SITE_OTHER): Payer: Medicare HMO | Admitting: Family

## 2019-03-09 ENCOUNTER — Encounter: Payer: Self-pay | Admitting: Family

## 2019-03-09 VITALS — BP 134/69 | HR 78 | Temp 96.5°F | Resp 16 | Ht 64.0 in | Wt 309.0 lb

## 2019-03-09 DIAGNOSIS — R109 Unspecified abdominal pain: Secondary | ICD-10-CM

## 2019-03-09 DIAGNOSIS — R0789 Other chest pain: Secondary | ICD-10-CM | POA: Diagnosis not present

## 2019-03-09 DIAGNOSIS — R6889 Other general symptoms and signs: Secondary | ICD-10-CM | POA: Diagnosis not present

## 2019-03-09 MED ORDER — VITAMIN D3 75 MCG (3000 UT) PO TABS: 1.0000 | ORAL_TABLET | Freq: Every day | ORAL | 0 refills | Status: DC

## 2019-03-09 NOTE — Progress Notes (Signed)
Subjective:    Patient ID: Leah Hampton, female    DOB: 06-29-1955, 63 y.o.   MRN: [id number]  HPI  Pt is a 63 yr old female who presents today for follow up of her abdominal pain and chest wall pain. Reports significant improvement in pain. Last visit we gave her a trial of meloxicam and obtained a CBC which was WNL.    States she is interested in weight loss surgery and wonders if this would be covered by her surgery.   Review of Systems    see HPI  Past Medical History:  Diagnosis Date  . Bronchitis   . Cystitis   . Diabetes type 2, controlled ([place])   . Hematuria    urologice eval, Dr Estill Dooms  . History of chest pain   . Hyperbilirubinemia   . Hyperlipemia   . Hypertelorism   . Hypertension   . Leukocytosis   . Obesity   . Osteoarthritis    right knee  . Sleep apnea 2012   slight   . Vitamin D deficiency 02/09/2018     Social History   Socioeconomic History  . Marital status: Single    Spouse name: Not on file  . Number of children: 2  . Years of education: Not on file  . Highest education level: Not on file  Occupational History  . Occupation: disabled  Social Needs  . Financial resource strain: Not on file  . Food insecurity    Worry: Sometimes true    Inability: Sometimes true  . Transportation needs    Medical: No    Non-medical: No  Tobacco Use  . Smoking status: Never Smoker  . Smokeless tobacco: Never Used  Substance and Sexual Activity  . Alcohol use: No  . Drug use: No  . Sexual activity: Not Currently  Lifestyle  . Physical activity    Days per week: 3 days    Minutes per session: 60 min  . Stress: Not on file  Relationships  . Social connections    Talks on phone: More than three times a week    Gets together: Three times a week    Attends religious service: More than 4 times per year    Active member of club or organization: Yes    Attends meetings of clubs or organizations: More than 4 times per year    Relationship status:  Married  . Intimate partner violence    Fear of current or ex partner: Not on file    Emotionally abused: Not on file    Physically abused: Not on file    Forced sexual activity: Not on file  Other Topics Concern  . Not on file  Social History Narrative   Regular exercise:  No      Lives alone   Has 2 grown sons, both local.  1 grandson   On disability due to her knee pain   Completed 12th grade   Enjoys television   No pets.     Past Surgical History:  Procedure Laterality Date  . COLONOSCOPY WITH PROPOFOL N/A 03/27/2015   Procedure: COLONOSCOPY WITH PROPOFOL;  Surgeon: Milus Banister, MD;  Location: [facility];  Service: Endoscopy;  Laterality: N/A;  . DILATATION & CURETTAGE/HYSTEROSCOPY WITH MYOSURE N/A 01/05/2017   Procedure: DILATATION & CURETTAGE/HYSTEROSCOPY WITH MYOSURE;  Surgeon: Thurnell Lose, MD;  Location: [facility];  Service: Gynecology;  Laterality: N/A;  PostMenopausal Bleeding  . KNEE ARTHROSCOPY  04/2007   left knee  .  TOTAL KNEE ARTHROPLASTY  10/2008   Dr Roxine Caddy    Family History  Problem Relation Age of Onset  . Heart failure Mother   . Arthritis Mother   . Hypertension Mother   . Stroke Mother   . Heart attack Father 14  . Diabetes Other   . Arthritis Other   . Stroke Other   . Coronary artery disease Other   . Heart failure Sister        x 2  . Colon cancer Neg Hx     Allergies  Allergen Reactions  . Sulfamethoxazole-Trimethoprim [Bactrim] Itching  . Codeine Nausea And Vomiting and Anxiety    Current Outpatient Medications on File Prior to Visit  Medication Sig Dispense Refill  . blood glucose meter kit and supplies KIT Dispense based on patient and insurance preference. Use up to four times daily as directed. (FOR ICD-9 250.00, 250.01). 1 each 0  . Cholecalciferol (VITAMIN D3) 75 MCG (3000 UT) TABS Take 1 tablet by mouth daily. 30 tablet   . clotrimazole-betamethasone (LOTRISONE) cream Apply 1 application topically 2 (two) times daily. 30  g 2  . Cyanocobalamin (VITAMIN B-12) 1000 MCG/15ML LIQD     . fluticasone (FLOVENT HFA) 110 MCG/ACT inhaler INHALE 2 PUFFS TWICE DAILY 36 g 1  . ibuprofen (ADVIL,MOTRIN) 600 MG tablet Take 1 tablet (600 mg total) by mouth every 8 (eight) hours as needed. 30 tablet 0  . losartan-hydrochlorothiazide (HYZAAR) 100-25 MG tablet Take 1 tablet by mouth daily. 90 tablet 3  . meloxicam (MOBIC) 7.5 MG tablet Take 1 tablet (7.5 mg total) by mouth daily. 14 tablet 0  . omeprazole (PRILOSEC) 20 MG capsule Take 1 capsule (20 mg total) by mouth daily as needed. 90 capsule 1  . potassium chloride SA (K-DUR) 20 MEQ tablet Take 1 tablet (20 mEq total) by mouth daily. 90 tablet 1  . pravastatin (PRAVACHOL) 40 MG tablet Take 1 tablet (40 mg total) by mouth daily. 90 tablet 1  . valACYclovir (VALTREX) 1000 MG tablet TAKE 1 TABLET (1,000 MG TOTAL) BY MOUTH DAILY. 90 tablet 1   No current facility-administered medications on file prior to visit.     BP 134/69 (BP Location: Right Arm, Patient Position: Sitting, Cuff Size: Large)   Pulse 78   Temp (!) 96.5 F (35.8 C) (Temporal)   Resp 16   Ht 5' 4"  (1.626 m)   Wt (!) 309 lb (140.2 kg)   LMP 11/10/2011   SpO2 99%   BMI 53.04 kg/m    Objective:   Physical Exam Constitutional:      Appearance: She is well-developed.  Neck:     Musculoskeletal: Neck supple.     Thyroid: No thyromegaly.  Cardiovascular:     Rate and Rhythm: Normal rate and regular rhythm.     Heart sounds: Normal heart sounds. No murmur.  Pulmonary:     Effort: Pulmonary effort is normal. No respiratory distress.     Breath sounds: Normal breath sounds. No wheezing.  Musculoskeletal:     Right lower leg: 2+ Pitting Edema present.     Left lower leg: 2+ Pitting Edema present.     Comments: No reproducible anterior chest wall tenderness  Skin:    General: Skin is warm and dry.  Neurological:     Mental Status: She is alert and oriented to person, place, and time.  Psychiatric:         Behavior: Behavior normal.        Thought  Content: Thought content normal.        Judgment: Judgment normal.           Assessment & Plan:  Musculoskeletal chest pain- improved with meloxiam.  Abdominal discomfort- improved. Monitor.  Morbid obesity- requesting a referral to the bariatric clinic- she is interested in exploring the possibility of bariatric surgery.

## 2019-03-09 NOTE — Patient Instructions (Signed)
Please call if recurrent chest wall pain. You should be contacted about your referral to the Bariatric clinic.

## 2019-03-09 NOTE — Addendum Note (Signed)
Addended by: Debbrah Alar on: 03/09/2019 01:06 PM   Modules accepted: Orders

## 2019-03-14 ENCOUNTER — Telehealth: Payer: Self-pay

## 2019-03-14 NOTE — Telephone Encounter (Signed)
Patient reports she scheduled an appointment with sports medicine for hip and knee pain. She know we have not evaluate her for either one. She will call insurance to see if she needs referral.  Patient advised if she does she needs to schedule appointment with [place] in order to get referral

## 2019-03-20 ENCOUNTER — Encounter: Payer: Self-pay | Admitting: Family Medicine

## 2019-03-20 ENCOUNTER — Ambulatory Visit: Payer: Self-pay

## 2019-03-20 ENCOUNTER — Other Ambulatory Visit: Payer: Self-pay

## 2019-03-20 ENCOUNTER — Ambulatory Visit: Payer: Medicare HMO | Admitting: Family Medicine

## 2019-03-20 VITALS — BP 124/83 | HR 90 | Ht 64.0 in | Wt 309.0 lb

## 2019-03-20 DIAGNOSIS — R6889 Other general symptoms and signs: Secondary | ICD-10-CM | POA: Diagnosis not present

## 2019-03-20 DIAGNOSIS — M25552 Pain in left hip: Secondary | ICD-10-CM

## 2019-03-20 DIAGNOSIS — M1711 Unilateral primary osteoarthritis, right knee: Secondary | ICD-10-CM | POA: Diagnosis not present

## 2019-03-20 MED ORDER — METHYLPREDNISOLONE ACETATE 40 MG/ML IJ SUSP
40.0000 mg | Freq: Once | INTRAMUSCULAR | Status: AC
  Administered 2019-03-20: 40 mg via INTRA_ARTICULAR

## 2019-03-20 MED ORDER — TRIAMCINOLONE ACETONIDE 40 MG/ML IJ SUSP
40.0000 mg | Freq: Once | INTRAMUSCULAR | Status: AC
  Administered 2019-03-20: 40 mg via INTRA_ARTICULAR

## 2019-03-20 NOTE — Progress Notes (Signed)
Leah Hampton - 63 y.o. female MRN [id number]  Date of birth: October 21, 1955  SUBJECTIVE:  Including CC & ROS.  No chief complaint on file.   Leah Hampton is a 63 y.o. female that is presenting with acute on chronic right knee pain and left hip pain.  The knee pain is been ongoing for years.  Is gotten worse recently.  She has received steroid and gel injections in the knee previously.  The pain can be severe.  Denies any mechanical symptoms.  It is worse with going up and down stairs or prolonged standing.  The left hip pain is occurring over the lateral aspect as well as some in the left lower back.  This pain has been ongoing for weeks to years.  The pain is worse with prolonged standing.  She does have some radiation down the lateral aspect of the thigh.  Denies any specific inciting event.  No prior surgery.  Pain can be sharp and stabbing.  No improvement with modalities to date.  The left is much more severe than the right.  The pain is moderate to severe.  The pain is intermittent.  Does get relief with sitting..  Independent review of the right knee x-rays from 2013 showed moderate to severe degenerative changes of the joint line in the right knee   Review of Systems  Constitutional: Negative for fever.  HENT: Negative for congestion.   Respiratory: Negative for cough.   Cardiovascular: Negative for chest pain.  Gastrointestinal: Negative for abdominal pain.  Musculoskeletal: Positive for arthralgias, back pain, gait problem and joint swelling.  Skin: Negative for color change.  Neurological: Negative for weakness.  Hematological: Negative for adenopathy.    HISTORY: Past Medical, Surgical, Social, and Family History Reviewed & Updated per EMR.   Pertinent Historical Findings include:  Past Medical History:  Diagnosis Date  . Bronchitis   . Cystitis   . Diabetes type 2, controlled ([place])   . Hematuria    urologice eval, Dr Estill Dooms  . History of chest pain   .  Hyperbilirubinemia   . Hyperlipemia   . Hypertelorism   . Hypertension   . Leukocytosis   . Obesity   . Osteoarthritis    right knee  . Sleep apnea 2012   slight   . Vitamin D deficiency 02/09/2018    Past Surgical History:  Procedure Laterality Date  . COLONOSCOPY WITH PROPOFOL N/A 03/27/2015   Procedure: COLONOSCOPY WITH PROPOFOL;  Surgeon: Milus Banister, MD;  Location: [facility];  Service: Endoscopy;  Laterality: N/A;  . DILATATION & CURETTAGE/HYSTEROSCOPY WITH MYOSURE N/A 01/05/2017   Procedure: DILATATION & CURETTAGE/HYSTEROSCOPY WITH MYOSURE;  Surgeon: Thurnell Lose, MD;  Location: [facility];  Service: Gynecology;  Laterality: N/A;  PostMenopausal Bleeding  . KNEE ARTHROSCOPY  04/2007   left knee  . TOTAL KNEE ARTHROPLASTY  10/2008   Dr Roxine Caddy    Allergies  Allergen Reactions  . Sulfamethoxazole-Trimethoprim [Bactrim] Itching  . Codeine Nausea And Vomiting and Anxiety    Family History  Problem Relation Age of Onset  . Heart failure Mother   . Arthritis Mother   . Hypertension Mother   . Stroke Mother   . Heart attack Father 65  . Diabetes Other   . Arthritis Other   . Stroke Other   . Coronary artery disease Other   . Heart failure Sister        x 2  . Colon cancer Neg Hx  Social History   Socioeconomic History  . Marital status: Single    Spouse name: Not on file  . Number of children: 2  . Years of education: Not on file  . Highest education level: Not on file  Occupational History  . Occupation: disabled  Social Needs  . Financial resource strain: Not on file  . Food insecurity    Worry: Sometimes true    Inability: Sometimes true  . Transportation needs    Medical: No    Non-medical: No  Tobacco Use  . Smoking status: Never Smoker  . Smokeless tobacco: Never Used  Substance and Sexual Activity  . Alcohol use: No  . Drug use: No  . Sexual activity: Not Currently  Lifestyle  . Physical activity    Days per week: 3 days     Minutes per session: 60 min  . Stress: Not on file  Relationships  . Social connections    Talks on phone: More than three times a week    Gets together: Three times a week    Attends religious service: More than 4 times per year    Active member of club or organization: Yes    Attends meetings of clubs or organizations: More than 4 times per year    Relationship status: Married  . Intimate partner violence    Fear of current or ex partner: Not on file    Emotionally abused: Not on file    Physically abused: Not on file    Forced sexual activity: Not on file  Other Topics Concern  . Not on file  Social History Narrative   Regular exercise:  No      Lives alone   Has 2 grown sons, both local.  1 grandson   On disability due to her knee pain   Completed 12th grade   Enjoys television   No pets.      PHYSICAL EXAM:  VS: LMP 11/10/2011  Physical Exam Gen: NAD, alert, cooperative with exam, well-appearing ENT: normal lips, normal nasal mucosa,  Eye: normal EOM, normal conjunctiva and lids CV:  no edema, +2 pedal pulses   Resp: no accessory muscle use, non-labored,   Skin: no rashes, no areas of induration  Neuro: normal tone, normal sensation to touch Psych:  normal insight, alert and oriented MSK:  Right knee: Tenderness to palpation over the medial joint line. Normal flexion extension. No obvious effusion. Instability with valgus and varus stress testing. Positive McMurray's test. No pain with patellar grind. Left hip: Tenderness to palpation of the greater trochanter. Normal internal and external rotation. Normal strength resistance with hip flexion. Weakness with hip abduction. Negative straight leg raise. Neurovascularly intact    Aspiration/Injection Procedure Note Leah Hampton [birth year]  Procedure: Injection Indications: Left hip pain  Procedure Details Consent: Risks of procedure as well as the alternatives and risks of each were explained to  the (patient/caregiver).  Consent for procedure obtained. Time Out: Verified patient identification, verified procedure, site/side was marked, verified correct patient position, special equipment/implants available, medications/allergies/relevent history reviewed, required imaging and test results available.  Performed.  The area was cleaned with iodine and alcohol swabs.    The left greater trochanteric bursa was injected using 1 cc's of 40 mg Depo-Medrol and 4 cc's of 0.25% bupivacaine with a 22 3 1/2" needle.  Ultrasound was used. Images were obtained in short views showing the injection.     A sterile dressing was applied.  Patient did tolerate procedure  well.    Aspiration/Injection Procedure Note Leah Hampton Jun 05, 1955  Procedure: Injection Indications: Right knee pain  Procedure Details Consent: Risks of procedure as well as the alternatives and risks of each were explained to the (patient/caregiver).  Consent for procedure obtained. Time Out: Verified patient identification, verified procedure, site/side was marked, verified correct patient position, special equipment/implants available, medications/allergies/relevent history reviewed, required imaging and test results available.  Performed.  The area was cleaned with iodine and alcohol swabs.    The right knee superior lateral suprapatellar pouch was injected using 1 cc's of 40 mg Kenalog and 4 cc's of 0.25% bupivacaine with a 22 1 1/2" needle.  Ultrasound was used. Images were obtained in long views showing the injection.     A sterile dressing was applied.  Patient did tolerate procedure well.    ASSESSMENT & PLAN:   Osteoarthritis of right knee Acute on chronic in nature.  Severe degenerative changes of the medial compartment. -Injection. -Try medial unloader brace due to thigh to calf ratio. -Referral to physical therapy. -Can consider gel injections or updated imaging.  Greater trochanteric pain syndrome of  left lower extremity Pain seems to be located over the lateral hip as well as the lower back.  Likely has component of greater trochanteric bursa.  May have lower back issues as well.  May stem from the right knee being an issue. -Greater trochanteric bursa injection. -Counseled on home exercise therapy and supportive care. -Referral to physical therapy. -Can consider imaging if no improvement.

## 2019-03-20 NOTE — Assessment & Plan Note (Signed)
Pain seems to be located over the lateral hip as well as the lower back.  Likely has component of greater trochanteric bursa.  May have lower back issues as well.  May stem from the right knee being an issue. -Greater trochanteric bursa injection. -Counseled on home exercise therapy and supportive care. -Referral to physical therapy. -Can consider imaging if no improvement.

## 2019-03-20 NOTE — Assessment & Plan Note (Signed)
Acute on chronic in nature.  Severe degenerative changes of the medial compartment. -Injection. -Try medial unloader brace due to thigh to calf ratio. -Referral to physical therapy. -Can consider gel injections or updated imaging.

## 2019-03-20 NOTE — Patient Instructions (Signed)
Nice to meet you  Please try voltaren over the counter rub on medicine  Please try ice  Please try the exercises  Physical therapy should give you a call or text The representative for the knee brace should give you a call.   Please send me a message in MyChart with any questions or updates.  Please see me back in  4 weeks.   --Dr. Raeford Razor

## 2019-03-24 ENCOUNTER — Other Ambulatory Visit: Payer: Self-pay | Admitting: Family

## 2019-03-27 DIAGNOSIS — R6889 Other general symptoms and signs: Secondary | ICD-10-CM | POA: Diagnosis not present

## 2019-04-20 ENCOUNTER — Ambulatory Visit: Payer: Medicare HMO | Admitting: Family Medicine

## 2019-04-23 ENCOUNTER — Encounter: Payer: Self-pay | Admitting: Obstetrics & Gynecology

## 2019-04-23 ENCOUNTER — Ambulatory Visit (INDEPENDENT_AMBULATORY_CARE_PROVIDER_SITE_OTHER): Payer: Medicare HMO | Admitting: Obstetrics & Gynecology

## 2019-04-23 ENCOUNTER — Other Ambulatory Visit (HOSPITAL_COMMUNITY)
Admission: RE | Admit: 2019-04-23 | Discharge: 2019-04-23 | Disposition: A | Discharge status: Home / Self Care | Payer: Medicare HMO | Source: Ambulatory Visit | Attending: Obstetrics & Gynecology | Admitting: Obstetrics & Gynecology

## 2019-04-23 ENCOUNTER — Other Ambulatory Visit: Payer: Self-pay

## 2019-04-23 VITALS — BP 109/74 | HR 88 | Ht 64.0 in | Wt 313.0 lb

## 2019-04-23 DIAGNOSIS — N950 Postmenopausal bleeding: Secondary | ICD-10-CM

## 2019-04-23 DIAGNOSIS — Z01419 Encounter for gynecological examination (general) (routine) without abnormal findings: Secondary | ICD-10-CM | POA: Diagnosis not present

## 2019-04-23 DIAGNOSIS — R6889 Other general symptoms and signs: Secondary | ICD-10-CM | POA: Diagnosis not present

## 2019-04-23 DIAGNOSIS — N858 Other specified noninflammatory disorders of uterus: Secondary | ICD-10-CM | POA: Diagnosis not present

## 2019-04-23 DIAGNOSIS — Z1151 Encounter for screening for human papillomavirus (HPV): Secondary | ICD-10-CM | POA: Diagnosis not present

## 2019-04-23 NOTE — Progress Notes (Signed)
Postmenopausal bleeding. Patient states she had 2-3 days of light bleeding where she had to use a pad.  Kathrene Alu RN

## 2019-04-23 NOTE — Progress Notes (Signed)
Post menopauSubjective:     Leah Hampton is a 63 y.o. female here for a routine exam. G2P2. Pt with LMP years prev. Has a hysteroscopy with polypectomy in 2018 for PMPB. The surg path was benign.  She reports PMPB in Nov of a light pink for 3-4 days. She denies pain. No constitutional sx. She denies bleeding since that time. Pt is not currently sexually active.   Gynecologic History Patient's last menstrual period was 11/10/2011. Contraception: post menopausal status Last Pap: 2018. Results were: normal Last mammogram: 11/24/2018. Results were: normal  Obstetric History OB History  Gravida Para Term Preterm AB Living  2 2 2         SAB TAB Ectopic Multiple Live Births          2    # Outcome Date GA Lbr Len/2nd Weight Sex Delivery Anes PTL Lv  2 Term      Vag-Spont     1 Term      Vag-Spont      The following portions of the patient's history were reviewed and updated as appropriate: allergies, current medications, past family history, past medical history, past social history, past surgical history and problem list.  Review of Systems Pertinent items are noted in HPI.    Objective:  BP 109/74   Pulse 88   Ht 5\' 4"  (1.626 m)   Wt (!) 313 lb (142 kg)   LMP 11/10/2011   BMI 53.73 kg/m  General Appearance:    Alert, cooperative, no distress, appears stated age  Head:    Normocephalic, without obvious abnormality, atraumatic  Eyes:    conjunctiva/corneas clear, EOM's intact, both eyes  Ears:    Normal external ear canals, both ears  Nose:   Nares normal, septum midline, mucosa normal, no drainage    or sinus tenderness  Throat:   Lips, mucosa, and tongue normal; teeth and gums normal  Neck:   Supple, symmetrical, trachea midline, no adenopathy;    thyroid:  no enlargement/tenderness/nodules  Back:     Symmetric, no curvature, ROM normal, no CVA tenderness  Lungs:     respirations unlabored  Chest Wall:    No tenderness or deformity   Heart:    Regular rate and rhythm   Breast Exam:    No tenderness, masses, or nipple abnormality; pendulous breast bilaterally   Abdomen:     Soft, non-tender, bowel sounds active all four quadrants,    no masses, no organomegaly  Genitalia:    Normal female without lesion, discharge or tenderness     Extremities:   Extremities normal, atraumatic, no cyanosis or edema  Pulses:   2+ and symmetric all extremities  Skin:   Skin color, texture, turgor normal, no rashes or lesions  The indications for endometrial biopsy were reviewed.   Risks of the biopsy including cramping, bleeding, infection, uterine perforation, inadequate specimen and need for additional procedures  were discussed. The patient states she understands and agrees to undergo procedure today. Consent was signed. Time out was performed. Urine HCG was negative. A sterile speculum was placed in the patient's vagina and the cervix was prepped with Betadine. A single-toothed tenaculum was placed on the anterior lip of the cervix to stabilize it. The 3 mm pipelle was introduced into the endometrial cavity without difficulty to a depth of 8-9cm, and a minimal amount of tissue was obtained and sent to pathology. The instruments were removed from the patient's vagina. Minimal bleeding from the cervix was noted.  The patient tolerated the procedure well.  01/05/2017 surg path  The non-polypoid endometrium is benign secretory-type endometrium. Assessment:    Healthy female exam.   PMPB. S/p hysteroscopy with polypectomy in 2018. Benign path. S/p endo bx today     Plan:    Routine post-procedure instructions were given to the patient. The patient will follow up to review the results and for further management.  \ F/u surg path F/u PAP F/u in 4 weeks or sooner prn for results.  Pelvic US If all results are neg, will follow. If sx persist will rec hysteroscopy.  F/u 1 year annual

## 2019-04-24 LAB — CYTOLOGY - PAP
Comment: NEGATIVE
Diagnosis: NEGATIVE
High risk HPV: NEGATIVE

## 2019-04-24 LAB — SURGICAL PATHOLOGY

## 2019-04-25 ENCOUNTER — Telehealth: Payer: Self-pay

## 2019-04-25 NOTE — Telephone Encounter (Signed)
-----   Message from Lavonia Drafts, MD sent at 04/25/2019 12:39 PM EST ----- Please call pt. She has yeast on her PAP. Please ask pt if she is having sx. If she is, she needs treatment for a yeast infxn.   Thx,  Clh-S

## 2019-04-25 NOTE — Telephone Encounter (Signed)
Patient called and made aware that her pap smear showed some yeast. Patient states she is not really having any symptoms at this time. Does not want treatment. Kathrene Alu RN

## 2019-05-08 ENCOUNTER — Ambulatory Visit (HOSPITAL_BASED_OUTPATIENT_CLINIC_OR_DEPARTMENT_OTHER): Payer: Medicare HMO

## 2019-05-08 ENCOUNTER — Other Ambulatory Visit: Payer: Self-pay

## 2019-05-08 ENCOUNTER — Encounter: Payer: Self-pay | Admitting: Family

## 2019-05-08 ENCOUNTER — Ambulatory Visit (HOSPITAL_BASED_OUTPATIENT_CLINIC_OR_DEPARTMENT_OTHER)
Admission: RE | Admit: 2019-05-08 | Discharge: 2019-05-08 | Disposition: A | Discharge status: Home / Self Care | Payer: Medicare HMO | Source: Ambulatory Visit | Attending: Obstetrics & Gynecology | Admitting: Obstetrics & Gynecology

## 2019-05-08 ENCOUNTER — Ambulatory Visit (INDEPENDENT_AMBULATORY_CARE_PROVIDER_SITE_OTHER): Payer: Medicare HMO | Admitting: Family

## 2019-05-08 ENCOUNTER — Telehealth: Payer: Self-pay

## 2019-05-08 VITALS — BP 131/75 | HR 86 | Temp 97.8°F | Resp 16 | Wt 312.6 lb

## 2019-05-08 DIAGNOSIS — E118 Type 2 diabetes mellitus with unspecified complications: Secondary | ICD-10-CM | POA: Diagnosis not present

## 2019-05-08 DIAGNOSIS — R6889 Other general symptoms and signs: Secondary | ICD-10-CM | POA: Diagnosis not present

## 2019-05-08 DIAGNOSIS — E785 Hyperlipidemia, unspecified: Secondary | ICD-10-CM

## 2019-05-08 DIAGNOSIS — N950 Postmenopausal bleeding: Secondary | ICD-10-CM | POA: Diagnosis not present

## 2019-05-08 DIAGNOSIS — I10 Essential (primary) hypertension: Secondary | ICD-10-CM | POA: Diagnosis not present

## 2019-05-08 DIAGNOSIS — K219 Gastro-esophageal reflux disease without esophagitis: Secondary | ICD-10-CM | POA: Diagnosis not present

## 2019-05-08 LAB — LIPID PANEL
Cholesterol: 184 mg/dL (ref 0–200)
HDL: 47.2 mg/dL (ref >39.00)
LDL Cholesterol: 121 mg/dL — ABNORMAL HIGH (ref 0–99)
NonHDL: 136.36
Total CHOL/HDL Ratio: 4
Triglycerides: 77 mg/dL (ref 0.0–149.0)
VLDL: 15.4 mg/dL (ref 0.0–40.0)

## 2019-05-08 LAB — COMPREHENSIVE METABOLIC PANEL
ALT: 19 U/L (ref 0–35)
AST: 26 U/L (ref 0–37)
Albumin: 3.9 g/dL (ref 3.5–5.2)
Alkaline Phosphatase: 59 U/L (ref 39–117)
BUN: 18 mg/dL (ref 6–23)
CO2: 29 mEq/L (ref 19–32)
Calcium: 9.4 mg/dL (ref 8.4–10.5)
Chloride: 103 mEq/L (ref 96–112)
Creatinine, Ser: 1.03 mg/dL (ref 0.40–1.20)
GFR: 65.79 mL/min (ref >60.00)
Glucose, Bld: 112 mg/dL — ABNORMAL HIGH (ref 70–99)
Potassium: 3.5 mEq/L (ref 3.5–5.1)
Sodium: 139 mEq/L (ref 135–145)
Total Bilirubin: 0.4 mg/dL (ref 0.2–1.2)
Total Protein: 7.4 g/dL (ref 6.0–8.3)

## 2019-05-08 LAB — HEMOGLOBIN A1C: Hgb A1c MFr Bld: 6.5 % (ref 4.6–6.5)

## 2019-05-08 MED ORDER — MELOXICAM 7.5 MG PO TABS: 7.5000 mg | ORAL_TABLET | Freq: Every day | ORAL | 1 refills | Status: DC | PRN

## 2019-05-08 NOTE — Telephone Encounter (Signed)
Opal Sidles from [facility] called stating pt's US done on 05/08/19 is abnormal. Message was sent to provider.Renda Pohlman l Leverett, CMA

## 2019-05-08 NOTE — Progress Notes (Signed)
Subjective:    Patient ID: Leah Hampton, female    DOB: [birth year], 63 y.o.   MRN: [id number]  HPI   Patient is a 63 yr old female who presents today for follow up.    DM2- Reports that diet needs improvement.   Lab Results  Component Value Date   HGBA1C 6.5 12/28/2018   HGBA1C 6.6 (H) 05/16/2018   HGBA1C 5.9 (A) 04/07/2018   Lab Results  Component Value Date   MICROALBUR 0.97 07/13/2011   LDLCALC 122 (H) 12/28/2018   CREATININE 1.17 12/28/2018   Hyperlipidemia- maintained on pravachol.  Lab Results  Component Value Date   CHOL 187 12/28/2018   HDL 49.90 12/28/2018   LDLCALC 122 (H) 12/28/2018   TRIG 80.0 12/28/2018   CHOLHDL 4 12/28/2018    HTN- maintained on losartan-hydrochlorothiazide.  BP Readings from Last 3 Encounters:  05/08/19 131/75  04/23/19 109/74  03/20/19 124/83   GERD- Continues prilosec. Symptoms stable but return if she stops.    Knee pain- requesting refill of meloxicam.   Review of Systems See HPI  Past Medical History:  Diagnosis Date  . Bronchitis   . Cystitis   . Diabetes type 2, controlled ([place])   . Hematuria    urologice eval, Dr Estill Dooms  . History of chest pain   . Hyperbilirubinemia   . Hyperlipemia   . Hypertelorism   . Hypertension   . Leukocytosis   . Obesity   . Osteoarthritis    right knee  . Sleep apnea 2012   slight   . Vaginal Pap smear, abnormal   . Vitamin D deficiency 02/09/2018     Social History   Socioeconomic History  . Marital status: Single    Spouse name: Not on file  . Number of children: 2  . Years of education: Not on file  . Highest education level: Not on file  Occupational History  . Occupation: disabled  Tobacco Use  . Smoking status: Never Smoker  . Smokeless tobacco: Never Used  Substance and Sexual Activity  . Alcohol use: No  . Drug use: No  . Sexual activity: Not Currently  Other Topics Concern  . Not on file  Social History Narrative   Regular exercise:  No      Lives  alone   Has 2 grown sons, both local.  1 grandson   On disability due to her knee pain   Completed 12th grade   Enjoys television   No pets.    Social Determinants of Health   Financial Resource Strain:   . Difficulty of Paying Living Expenses: Not on file  Food Insecurity:   . Worried About Charity fundraiser in the Last Year: Not on file  . Ran Out of Food in the Last Year: Not on file  Transportation Needs:   . Lack of Transportation (Medical): Not on file  . Lack of Transportation (Non-Medical): Not on file  Physical Activity:   . Days of Exercise per Week: Not on file  . Minutes of Exercise per Session: Not on file  Stress:   . Feeling of Stress : Not on file  Social Connections:   . Frequency of Communication with Friends and Family: Not on file  . Frequency of Social Gatherings with Friends and Family: Not on file  . Attends Religious Services: Not on file  . Active Member of Clubs or Organizations: Not on file  . Attends Archivist Meetings: Not on file  .  Marital Status: Not on file  Intimate Partner Violence:   . Fear of Current or Ex-Partner: Not on file  . Emotionally Abused: Not on file  . Physically Abused: Not on file  . Sexually Abused: Not on file    Past Surgical History:  Procedure Laterality Date  . COLONOSCOPY WITH PROPOFOL N/A 03/27/2015   Procedure: COLONOSCOPY WITH PROPOFOL;  Surgeon: Milus Banister, MD;  Location: [facility];  Service: Endoscopy;  Laterality: N/A;  . DILATATION & CURETTAGE/HYSTEROSCOPY WITH MYOSURE N/A 01/05/2017   Procedure: DILATATION & CURETTAGE/HYSTEROSCOPY WITH MYOSURE;  Surgeon: Thurnell Lose, MD;  Location: [facility];  Service: Gynecology;  Laterality: N/A;  PostMenopausal Bleeding  . KNEE ARTHROSCOPY  04/2007   left knee  . TOTAL KNEE ARTHROPLASTY  10/2008   Dr Roxine Caddy    Family History  Problem Relation Age of Onset  . Heart failure Mother   . Arthritis Mother   . Hypertension Mother   . Stroke Mother     . Heart attack Father 44  . Diabetes Other   . Arthritis Other   . Stroke Other   . Coronary artery disease Other   . Heart failure Sister        x 2  . Colon cancer Neg Hx     Allergies  Allergen Reactions  . Sulfamethoxazole-Trimethoprim [Bactrim] Itching  . Codeine Nausea And Vomiting and Anxiety    Current Outpatient Medications on File Prior to Visit  Medication Sig Dispense Refill  . blood glucose meter kit and supplies KIT Dispense based on patient and insurance preference. Use up to four times daily as directed. (FOR ICD-9 250.00, 250.01). 1 each 0  . Cholecalciferol (VITAMIN D3) 75 MCG (3000 UT) TABS Take 1 tablet by mouth daily. 30 tablet 0  . clotrimazole-betamethasone (LOTRISONE) cream Apply 1 application topically 2 (two) times daily. 30 g 2  . Cyanocobalamin (VITAMIN B-12) 1000 MCG/15ML LIQD     . fluticasone (FLOVENT HFA) 110 MCG/ACT inhaler INHALE 2 PUFFS TWICE DAILY 36 g 1  . ibuprofen (ADVIL,MOTRIN) 600 MG tablet Take 1 tablet (600 mg total) by mouth every 8 (eight) hours as needed. 30 tablet 0  . losartan-hydrochlorothiazide (HYZAAR) 100-25 MG tablet Take 1 tablet by mouth daily. 90 tablet 3  . meloxicam (MOBIC) 7.5 MG tablet Take 1 tablet (7.5 mg total) by mouth daily. 14 tablet 0  . potassium chloride SA (K-DUR) 20 MEQ tablet Take 1 tablet (20 mEq total) by mouth daily. 90 tablet 1  . valACYclovir (VALTREX) 1000 MG tablet TAKE 1 TABLET EVERY DAY 90 tablet 1  . omeprazole (PRILOSEC) 20 MG capsule Take 1 capsule (20 mg total) by mouth daily as needed. 90 capsule 1  . pravastatin (PRAVACHOL) 40 MG tablet Take 1 tablet (40 mg total) by mouth daily. 90 tablet 1   No current facility-administered medications on file prior to visit.    BP 131/75 (BP Location: Right Arm, Patient Position: Sitting, Cuff Size: Large)   Pulse 86   Temp 97.8 F (36.6 C) (Oral)   Resp 16   Wt (!) 312 lb 9.6 oz (141.8 kg)   LMP 11/10/2011   SpO2 99%   BMI 53.66 kg/m        Objective:   Physical Exam Constitutional:      Appearance: She is well-developed.  Neck:     Thyroid: No thyromegaly.  Cardiovascular:     Rate and Rhythm: Normal rate and regular rhythm.     Heart sounds: Normal  heart sounds. No murmur.  Pulmonary:     Effort: Pulmonary effort is normal. No respiratory distress.     Breath sounds: Normal breath sounds. No wheezing.  Musculoskeletal:     Cervical back: Neck supple.  Skin:    General: Skin is warm and dry.  Neurological:     Mental Status: She is alert and oriented to person, place, and time.  Psychiatric:        Behavior: Behavior normal.        Thought Content: Thought content normal.        Judgment: Judgment normal.           Assessment & Plan:  HTN- bp stable on current meds. Continue same. Obtain CMET.  DM2- clinically stable. Discussed diet.  Obtain follow up A1C.  GERD- stable on PPI, continue same.  Hyperlipidemia- last LDL was above goal. Will obtain follow up lipid panel. Continue statin.  This visit occurred during the SARS-CoV-2 public health emergency.  Safety protocols were in place, including screening questions prior to the visit, additional usage of staff PPE, and extensive cleaning of exam room while observing appropriate contact time as indicated for disinfecting solutions.

## 2019-05-09 ENCOUNTER — Telehealth: Payer: Self-pay | Admitting: Obstetrics & Gynecology

## 2019-05-09 ENCOUNTER — Telehealth: Payer: Self-pay | Admitting: Family

## 2019-05-09 ENCOUNTER — Other Ambulatory Visit: Payer: Self-pay | Admitting: Nurse Practitioner

## 2019-05-09 ENCOUNTER — Other Ambulatory Visit: Payer: Self-pay | Admitting: Family

## 2019-05-09 ENCOUNTER — Other Ambulatory Visit: Payer: Self-pay | Admitting: Podiatry

## 2019-05-09 DIAGNOSIS — I10 Essential (primary) hypertension: Secondary | ICD-10-CM

## 2019-05-09 DIAGNOSIS — K219 Gastro-esophageal reflux disease without esophagitis: Secondary | ICD-10-CM

## 2019-05-09 MED ORDER — PRAVASTATIN SODIUM 80 MG PO TABS: 80.0000 mg | ORAL_TABLET | Freq: Every day | ORAL | 1 refills | Status: DC

## 2019-05-09 NOTE — Telephone Encounter (Signed)
Patient advised of results and medication increase. She verbalized understanding.

## 2019-05-09 NOTE — Telephone Encounter (Signed)
Please advise pt that her cholesterol is above goal. I would like for her to increase pravastatin from 40mg  to 80mg .  Rx sent to [facility].

## 2019-05-09 NOTE — Telephone Encounter (Signed)
TC to discuss results of endometrial bx and [place] results. Pt still has thickened endometrium despite neg bx.  Spoke to pt of my concern. Rec hysteroscopy.   Patient desires surgical management with hysteroscopy with D&C and possible polypectomy.   The risks of surgery were discussed in detail with the patient including but not limited to: bleeding which may require transfusion or reoperation; infection which may require prolonged hospitalization or re-hospitalization and antibiotic therapy; injury to bowel, bladder, ureters and major vessels or other surrounding organs; need for additional procedures including laparotomy; thromboembolic phenomenon, incisional problems and other postoperative or anesthesia complications.  Patient was told that the likelihood that her condition and symptoms will be treated effectively with this surgical management was very high; the postoperative expectations were also discussed in detail. The patient also understands the alternative treatment options which were discussed in full. All questions were answered.  She was told that she will be contacted by our surgical scheduler regarding the time and date of her surgery; routine preoperative instructions of having nothing to eat or drink after midnight on the day prior to surgery and also coming to the hospital 1 1/2 hours prior to her time of surgery were also emphasized.  She was told she may be called for a preoperative appointment about a week prior to surgery and will be given further preoperative instructions at that visit.   Pt wants to have this scheduled after the holidays.   Kielyn Kardell L. Harraway-Smith, M.D., [name]

## 2019-05-15 NOTE — Progress Notes (Signed)
This pt was just posted for surgery on 05-22-2019 for [facility].  Reviewed pt chart noted pt has BMI 53.66 as of 05-08-2019 w/ hx HTN, DM2, Asthma, and GERD.  Reviewed chart w/ anesthesia, Konrad Felix PA, per anesthesia guidelines pt is not candidate for [facility] due to BMI over 50.  Called and lvm for [place] , [place] scheduler for Dr Ihor Dow,  Inform her of the anesthesia guideline and would need to be moved.

## 2019-05-16 DIAGNOSIS — N950 Postmenopausal bleeding: Secondary | ICD-10-CM

## 2019-05-16 NOTE — Progress Notes (Signed)
PCP - Debbrah Alar NP  Cardiologist -   Chest x-ray -  EKG - 05/21/2019 Stress Test -  ECHO -  Cardiac Cath -   Sleep Study - unsure  CPAP -   Fasting Blood Sugar - unsure Checks Blood Sugar ___0__ times a day  Blood Thinner Instructions: Aspirin Instructions: Last Dose:  Anesthesia review:   Patient denies shortness of breath, fever, cough and chest pain at PAT appointment   Patient verbalized understanding of instructions that were given to them at the PAT appointment. Patient was also instructed that they will need to review over the PAT instructions again at home before surgery.

## 2019-05-16 NOTE — Patient Instructions (Addendum)
DUE TO COVID-19 ONLY ONE VISITOR IS ALLOWED TO COME WITH YOU AND STAY IN THE WAITING ROOM ONLY DURING PRE OP AND PROCEDURE DAY OF SURGERY. THE 1 VISITOR MAY VISIT WITH YOU AFTER SURGERY IN YOUR PRIVATE ROOM DURING VISITING HOURS ONLY!  YOU NEED TO HAVE A COVID 19 TEST ON_Saturday 01/02/2021______ @__11 :35 am_____, THIS TEST MUST BE DONE BEFORE SURGERY, COME  [place] , [address].  ([place]) ONCE YOUR COVID TEST IS COMPLETED, PLEASE BEGIN THE QUARANTINE INSTRUCTIONS AS OUTLINED IN YOUR HANDOUT.                Leah Hampton    Your procedure is scheduled on: Tuesday 05/22/2019   Report to [facility] Main  Entrance    Report to admitting at 2:00 pm     Call this number if you have problems the morning of surgery [phone]    Remember: Do not eat food or drink liquids after Midnight.    BRUSH YOUR TEETH MORNING OF SURGERY AND RINSE YOUR MOUTH OUT, NO CHEWING GUM CANDY OR MINTS.     Take these medicines the morning of surgery with A SIP OF WATER: Valacyclovir (Valtrex), Omeprazole (Prilosec), Flovent inhaler & bring inhaler with you to the hospital.    DO NOT TAKE ANY DIABETIC MEDICATIONS DAY OF YOUR SURGERY                               You may not have any metal on your body including hair pins and              piercings  Do not wear jewelry, make-up, lotions, powders or perfumes, deodorant             Do not wear nail polish on your fingernails.  Do not shave  48 hours prior to surgery.                 Do not bring valuables to the hospital. [place].  Contacts, dentures or bridgework may not be worn into surgery.  Leave suitcase in the car. After surgery it may be brought to your room.     Patients discharged the day of surgery will not be allowed to drive home. IF YOU ARE HAVING SURGERY AND GOING HOME THE SAME DAY, YOU MUST HAVE AN ADULT TO DRIVE YOU HOME AND  BE WITH YOU FOR  24 HOURS. YOU MAY GO HOME BY TAXI OR UBER OR ORTHERWISE, BUT AN ADULT MUST ACCOMPANY YOU HOME AND STAY WITH YOU FOR 24 HOURS.  Name and phone number of your driver: Niece Ronalda Ivanov     Special Instructions: N/A              Please read over the following fact sheets you were given: _____________________________________________________________________             [facility] - Preparing for Surgery Before surgery, you can play an important role.  Because skin is not sterile, your skin needs to be as free of germs as possible.  You can reduce the number of germs on your skin by washing with CHG (chlorahexidine gluconate) soap before surgery.  CHG is an antiseptic cleaner which kills germs and bonds with the skin to continue killing germs even after washing. Please DO NOT  use if you have an allergy to CHG or antibacterial soaps.  If your skin becomes reddened/irritated stop using the CHG and inform your nurse when you arrive at Short Stay. Do not shave (including legs and underarms) for at least 48 hours prior to the first CHG shower.  You may shave your face/neck. Please follow these instructions carefully:  1.  Shower with CHG Soap the night before surgery and the  morning of Surgery.  2.  If you choose to wash your hair, wash your hair first as usual with your  normal  shampoo.  3.  After you shampoo, rinse your hair and body thoroughly to remove the  shampoo.                            4.  Use CHG as you would any other liquid soap.  You can apply chg directly  to the skin and wash                       Gently with a scrungie or clean washcloth.  5.  Apply the CHG Soap to your body ONLY FROM THE NECK DOWN.   Do not use on face/ open                           Wound or open sores. Avoid contact with eyes, ears mouth and genitals (private parts).                       Wash face,  Genitals (private parts) with your normal soap.             6.  Wash thoroughly, paying special attention to the  area where your surgery  will be performed.  7.  Thoroughly rinse your body with warm water from the neck down.  8.  DO NOT shower/wash with your normal soap after using and rinsing off  the CHG Soap.                9.  Pat yourself dry with a clean towel.            10.  Wear clean pajamas.            11.  Place clean sheets on your bed the night of your first shower and do not  sleep with pets. Day of Surgery : Do not apply any lotions/deodorants the morning of surgery.  Please wear clean clothes to the hospital/surgery center.  FAILURE TO FOLLOW THESE INSTRUCTIONS MAY RESULT IN THE CANCELLATION OF YOUR SURGERY PATIENT SIGNATURE_________________________________  NURSE SIGNATURE__________________________________  ________________________________________________________________________

## 2019-05-17 ENCOUNTER — Other Ambulatory Visit: Payer: Self-pay

## 2019-05-17 ENCOUNTER — Encounter (HOSPITAL_COMMUNITY)
Admission: RE | Admit: 2019-05-17 | Discharge: 2019-05-17 | Disposition: A | Discharge status: Home / Self Care | Payer: Medicare HMO | Source: Ambulatory Visit | Attending: Obstetrics & Gynecology | Admitting: Obstetrics & Gynecology

## 2019-05-17 ENCOUNTER — Encounter (HOSPITAL_COMMUNITY): Payer: Self-pay

## 2019-05-17 DIAGNOSIS — Z01818 Encounter for other preprocedural examination: Secondary | ICD-10-CM | POA: Insufficient documentation

## 2019-05-18 DIAGNOSIS — Z87898 Personal history of other specified conditions: Secondary | ICD-10-CM

## 2019-05-18 HISTORY — DX: Personal history of other specified conditions: Z87.898

## 2019-05-19 ENCOUNTER — Other Ambulatory Visit (HOSPITAL_COMMUNITY)
Admission: RE | Admit: 2019-05-19 | Discharge: 2019-05-19 | Disposition: A | Discharge status: Home / Self Care | Payer: Medicare Other | Source: Ambulatory Visit | Attending: Obstetrics & Gynecology | Admitting: Obstetrics & Gynecology

## 2019-05-19 DIAGNOSIS — Z20822 Contact with and (suspected) exposure to covid-19: Secondary | ICD-10-CM | POA: Insufficient documentation

## 2019-05-19 DIAGNOSIS — Z01812 Encounter for preprocedural laboratory examination: Secondary | ICD-10-CM | POA: Insufficient documentation

## 2019-05-19 LAB — SARS CORONAVIRUS 2 (TAT 6-24 HRS): SARS Coronavirus 2: NEGATIVE

## 2019-05-21 ENCOUNTER — Encounter (HOSPITAL_COMMUNITY)
Admission: RE | Admit: 2019-05-21 | Discharge: 2019-05-21 | Disposition: A | Discharge status: Home / Self Care | Payer: Medicare Other | Source: Ambulatory Visit | Attending: Obstetrics & Gynecology | Admitting: Obstetrics & Gynecology

## 2019-05-21 ENCOUNTER — Other Ambulatory Visit: Payer: Self-pay

## 2019-05-21 DIAGNOSIS — I10 Essential (primary) hypertension: Secondary | ICD-10-CM | POA: Diagnosis not present

## 2019-05-21 DIAGNOSIS — Z01818 Encounter for other preprocedural examination: Secondary | ICD-10-CM | POA: Insufficient documentation

## 2019-05-21 LAB — CBC
HCT: 38.3 % (ref 36.0–46.0)
Hemoglobin: 12 g/dL (ref 12.0–15.0)
MCH: 28.1 pg (ref 26.0–34.0)
MCHC: 31.3 g/dL (ref 30.0–36.0)
MCV: 89.7 fL (ref 80.0–100.0)
Platelets: 286 10*3/uL (ref 150–400)
RBC: 4.27 MIL/uL (ref 3.87–5.11)
RDW: 16.2 % — ABNORMAL HIGH (ref 11.5–15.5)
WBC: 8.4 10*3/uL (ref 4.0–10.5)
nRBC: 0 % (ref 0.0–0.2)

## 2019-05-21 LAB — GLUCOSE, CAPILLARY: Glucose-Capillary: 108 mg/dL — ABNORMAL HIGH (ref 70–99)

## 2019-05-22 ENCOUNTER — Encounter (HOSPITAL_COMMUNITY)
Admission: RE | Disposition: A | Discharge status: Home / Self Care | Payer: Self-pay | Source: Home / Self Care | Attending: Obstetrics & Gynecology

## 2019-05-22 ENCOUNTER — Ambulatory Visit (HOSPITAL_COMMUNITY): Payer: Medicare Other | Admitting: Physician Assistant

## 2019-05-22 ENCOUNTER — Ambulatory Visit (HOSPITAL_COMMUNITY): Payer: Medicare Other | Admitting: Anesthesiology

## 2019-05-22 ENCOUNTER — Ambulatory Visit (HOSPITAL_COMMUNITY)
Admission: RE | Admit: 2019-05-22 | Discharge: 2019-05-22 | Disposition: A | Discharge status: Home / Self Care | Payer: Medicare Other | Attending: Obstetrics & Gynecology | Admitting: Obstetrics & Gynecology

## 2019-05-22 ENCOUNTER — Encounter (HOSPITAL_COMMUNITY): Payer: Self-pay | Admitting: Obstetrics & Gynecology

## 2019-05-22 ENCOUNTER — Other Ambulatory Visit: Payer: Self-pay

## 2019-05-22 DIAGNOSIS — Z96659 Presence of unspecified artificial knee joint: Secondary | ICD-10-CM | POA: Diagnosis not present

## 2019-05-22 DIAGNOSIS — I10 Essential (primary) hypertension: Secondary | ICD-10-CM | POA: Diagnosis not present

## 2019-05-22 DIAGNOSIS — E785 Hyperlipidemia, unspecified: Secondary | ICD-10-CM | POA: Diagnosis not present

## 2019-05-22 DIAGNOSIS — E119 Type 2 diabetes mellitus without complications: Secondary | ICD-10-CM | POA: Diagnosis not present

## 2019-05-22 DIAGNOSIS — J449 Chronic obstructive pulmonary disease, unspecified: Secondary | ICD-10-CM | POA: Insufficient documentation

## 2019-05-22 DIAGNOSIS — Z79899 Other long term (current) drug therapy: Secondary | ICD-10-CM | POA: Diagnosis not present

## 2019-05-22 DIAGNOSIS — Z6841 Body Mass Index (BMI) 40.0 and over, adult: Secondary | ICD-10-CM | POA: Insufficient documentation

## 2019-05-22 DIAGNOSIS — E559 Vitamin D deficiency, unspecified: Secondary | ICD-10-CM | POA: Diagnosis not present

## 2019-05-22 DIAGNOSIS — G4733 Obstructive sleep apnea (adult) (pediatric): Secondary | ICD-10-CM | POA: Insufficient documentation

## 2019-05-22 DIAGNOSIS — B009 Herpesviral infection, unspecified: Secondary | ICD-10-CM | POA: Insufficient documentation

## 2019-05-22 DIAGNOSIS — N840 Polyp of corpus uteri: Secondary | ICD-10-CM | POA: Diagnosis not present

## 2019-05-22 DIAGNOSIS — Z794 Long term (current) use of insulin: Secondary | ICD-10-CM | POA: Diagnosis not present

## 2019-05-22 DIAGNOSIS — N950 Postmenopausal bleeding: Secondary | ICD-10-CM

## 2019-05-22 HISTORY — PX: HYSTEROSCOPY WITH D & C: SHX1775

## 2019-05-22 SURGERY — DILATATION AND CURETTAGE /HYSTEROSCOPY
Anesthesia: General

## 2019-05-22 MED ORDER — FENTANYL CITRATE (PF) 100 MCG/2ML IJ SOLN
INTRAMUSCULAR | Status: AC
  Filled 2019-05-22: qty 2

## 2019-05-22 MED ORDER — KETOROLAC TROMETHAMINE 30 MG/ML IJ SOLN
INTRAMUSCULAR | Status: DC | PRN
  Administered 2019-05-22: 30 mg via INTRAVENOUS

## 2019-05-22 MED ORDER — FENTANYL CITRATE (PF) 100 MCG/2ML IJ SOLN
INTRAMUSCULAR | Status: DC | PRN
  Administered 2019-05-22 (×4): 25 ug via INTRAVENOUS

## 2019-05-22 MED ORDER — LIDOCAINE 2% (20 MG/ML) 5 ML SYRINGE
INTRAMUSCULAR | Status: DC | PRN
  Administered 2019-05-22: 50 mg via INTRAVENOUS

## 2019-05-22 MED ORDER — PROPOFOL 10 MG/ML IV BOLUS
INTRAVENOUS | Status: DC | PRN
  Administered 2019-05-22: 200 mg via INTRAVENOUS

## 2019-05-22 MED ORDER — LACTATED RINGERS IV SOLN: INTRAVENOUS | Status: DC

## 2019-05-22 MED ORDER — PHENYLEPHRINE 40 MCG/ML (10ML) SYRINGE FOR IV PUSH (FOR BLOOD PRESSURE SUPPORT)
PREFILLED_SYRINGE | INTRAVENOUS | Status: DC | PRN
  Administered 2019-05-22: 80 ug via INTRAVENOUS

## 2019-05-22 MED ORDER — LIDOCAINE HCL (PF) 1 % IJ SOLN
INTRAMUSCULAR | Status: AC
  Filled 2019-05-22: qty 30

## 2019-05-22 MED ORDER — PROPOFOL 10 MG/ML IV BOLUS
INTRAVENOUS | Status: AC
  Filled 2019-05-22: qty 20

## 2019-05-22 MED ORDER — ONDANSETRON HCL 4 MG/2ML IJ SOLN: 4.0000 mg | Freq: Once | INTRAMUSCULAR | Status: DC | PRN

## 2019-05-22 MED ORDER — OXYCODONE HCL 5 MG/5ML PO SOLN: 5.0000 mg | Freq: Once | ORAL | Status: DC | PRN

## 2019-05-22 MED ORDER — MEPERIDINE HCL 50 MG/ML IJ SOLN: 6.2500 mg | INTRAMUSCULAR | Status: DC | PRN

## 2019-05-22 MED ORDER — OXYCODONE HCL 5 MG PO TABS: 5.0000 mg | ORAL_TABLET | Freq: Once | ORAL | Status: DC | PRN

## 2019-05-22 MED ORDER — BUPIVACAINE HCL 0.25 % IJ SOLN
INTRAMUSCULAR | Status: DC | PRN
  Administered 2019-05-22: 20 mL

## 2019-05-22 MED ORDER — MIDAZOLAM HCL 5 MG/5ML IJ SOLN
INTRAMUSCULAR | Status: DC | PRN
  Administered 2019-05-22: 2 mg via INTRAVENOUS

## 2019-05-22 MED ORDER — DEXAMETHASONE SODIUM PHOSPHATE 10 MG/ML IJ SOLN
INTRAMUSCULAR | Status: DC | PRN
  Administered 2019-05-22: 5 mg via INTRAVENOUS

## 2019-05-22 MED ORDER — ONDANSETRON HCL 4 MG/2ML IJ SOLN
INTRAMUSCULAR | Status: DC | PRN
  Administered 2019-05-22: 4 mg via INTRAVENOUS

## 2019-05-22 MED ORDER — SODIUM CHLORIDE 0.9 % IR SOLN
Status: DC | PRN
  Administered 2019-05-22: 3000 mL

## 2019-05-22 MED ORDER — FENTANYL CITRATE (PF) 100 MCG/2ML IJ SOLN
25.0000 ug | INTRAMUSCULAR | Status: DC | PRN
  Administered 2019-05-22: 50 ug via INTRAVENOUS

## 2019-05-22 MED ORDER — BUPIVACAINE HCL (PF) 0.25 % IJ SOLN
INTRAMUSCULAR | Status: AC
  Filled 2019-05-22: qty 30

## 2019-05-22 MED ORDER — MIDAZOLAM HCL 2 MG/2ML IJ SOLN
INTRAMUSCULAR | Status: AC
  Filled 2019-05-22: qty 2

## 2019-05-22 MED ORDER — IBUPROFEN 600 MG PO TABS: 600.0000 mg | ORAL_TABLET | Freq: Four times a day (QID) | ORAL | 0 refills | Status: DC | PRN

## 2019-05-22 SURGICAL SUPPLY — 6 items
CATH ROBINSON RED A/P 16FR (CATHETERS) ×1 IMPLANT
DEVICE MYOSURE REACH (MISCELLANEOUS) ×1 IMPLANT
GAUZE SPONGE 4X4 12PLY STRL (GAUZE/BANDAGES/DRESSINGS) ×1 IMPLANT
KIT PROCEDURE FLUENT (KITS) ×1 IMPLANT
PACK VAGINAL MINOR WOMEN LF (CUSTOM PROCEDURE TRAY) ×1 IMPLANT
SEAL ROD LENS SCOPE MYOSURE (ABLATOR) ×1 IMPLANT

## 2019-05-22 NOTE — Anesthesia Procedure Notes (Signed)
Procedure Name: LMA Insertion Date/Time: 05/22/2019 2:35 PM Performed by: Anne Fu, CRNA Pre-anesthesia Checklist: Patient identified, Emergency Drugs available, Suction available, Patient being monitored and Timeout performed Patient Re-evaluated:Patient Re-evaluated prior to induction Oxygen Delivery Method: Circle system utilized Preoxygenation: Pre-oxygenation with 100% oxygen Induction Type: IV induction Ventilation: Mask ventilation without difficulty LMA: LMA inserted LMA Size: 4.0 Number of attempts: 1 Placement Confirmation: positive ETCO2 and breath sounds checked- equal and bilateral Tube secured with: Tape

## 2019-05-22 NOTE — Brief Op Note (Signed)
05/22/2019  3:28 PM  PATIENT:  Leah Hampton  64 y.o. female  PRE-OPERATIVE DIAGNOSIS:  PMPB  POST-OPERATIVE DIAGNOSIS:  PMPB  PROCEDURE:  Procedure(s): DILATATION AND CURETTAGE /HYSTEROSCOPY, POLYPECTOMY WITH MYOSURE (N/A)  SURGEON:  Surgeon(s) and Role:    * Lavonia Drafts, MD - Primary  ANESTHESIA:   general and paracervical block  EBL:  10 mL   BLOOD ADMINISTERED:none  DRAINS: none   LOCAL MEDICATIONS USED:  MARCAINE     SPECIMEN:  Source of Specimen:  enodmetrial curettings adn endometrial polyp   DISPOSITION OF SPECIMEN:  PATHOLOGY  COUNTS:  YES  TOURNIQUET:  * No tourniquets in log *  DICTATION: .Note written in EPIC  PLAN OF CARE: Discharge to home after PACU  PATIENT DISPOSITION:  PACU - hemodynamically stable.   Delay start of Pharmacological VTE agent (>24hrs) due to surgical blood loss or risk of bleeding: not applicable  Complications: none immediate.   Joshuan Bolander L. Harraway-Smith, M.D., [name]

## 2019-05-22 NOTE — Op Note (Signed)
05/22/2019  3:28 PM  PATIENT:  Leah Hampton  64 y.o. female  PRE-OPERATIVE DIAGNOSIS:  PMPB  POST-OPERATIVE DIAGNOSIS:  PMPB  PROCEDURE:  Procedure(s): DILATATION AND CURETTAGE /HYSTEROSCOPY, POLYPECTOMY WITH MYOSURE (N/A)  SURGEON:  Surgeon(s) and Role:    * Lavonia Drafts, MD - Primary  ANESTHESIA:   general and paracervical block  EBL:  10 mL   BLOOD ADMINISTERED:none  DRAINS: none   LOCAL MEDICATIONS USED:  MARCAINE     SPECIMEN:  Source of Specimen:  enodmetrial curettings adn endometrial polyp   DISPOSITION OF SPECIMEN:  PATHOLOGY  COUNTS:  YES  TOURNIQUET:  * No tourniquets in log *  DICTATION: .Note written in EPIC  PLAN OF CARE: Discharge to home after PACU  PATIENT DISPOSITION:  PACU - hemodynamically stable.   Delay start of Pharmacological VTE agent (>24hrs) due to surgical blood loss or risk of bleeding: not applicable  Complications: none immediate.   The patient is a 64 yo female with post menopausal bleeding.   PROCEDURE DETAILS:  The patient was taken to the operating room where general anesthesia was administered and was found to be adequate.  After an adequate timeout was performed, she was placed in the dorsal lithotomy position and examined; then prepped and draped in the sterile manner.   Her bladder was catheterized for an unmeasured amount of clear, yellow urine. A speculum was then placed in the patient's vagina and a single tooth tenaculum was applied to the anterior lip of the cervix.   A paracervical block using 20 ml of 0.25% Marcaine was administered.  The cervix was sounded to 9 cm and dilated manually with metal dilators to accommodate the Myosure operative hysteroscope.  Once the cervix was dilated, the hysteroscope was inserted under direct visualization using LR as a suspension medium.  The uterine cavity was carefully examined, both ostia were recognized, and diffusely proliferative endometrium with the fibroid (and polyp)  above were noted.   This was resected using the Myosure device.  After further careful visualization of the uterine cavity, the hysteroscope was removed under direct visualization.  A sharp curettage was then performed to obtain a moderate amount of endometrial curettings.  The tenaculum was removed from the anterior lip of the cervix and the vaginal speculum was removed after noting good hemostasis.  The patient tolerated the procedure well and was taken to the recovery area awake, extubated and in stable condition.  [name], M.D., [name]

## 2019-05-22 NOTE — Anesthesia Preprocedure Evaluation (Signed)
Anesthesia Evaluation  Patient identified by MRN, date of birth, ID band Patient awake    Reviewed: Allergy & Precautions, NPO status , Patient's Chart, lab work & pertinent test results  Airway Mallampati: III  TM Distance: >3 FB Neck ROM: Full    Dental no notable dental hx. (+) Teeth Intact   Pulmonary sleep apnea , COPD,  COPD inhaler,    Pulmonary exam normal breath sounds clear to auscultation       Cardiovascular hypertension, Pt. on medications Normal cardiovascular exam Rhythm:Regular Rate:Normal     Neuro/Psych  Neuromuscular disease negative psych ROS   GI/Hepatic negative GI ROS, Neg liver ROS,   Endo/Other  diabetes, Well Controlled, Type 2, Oral Hypoglycemic Agents, Insulin DependentMorbid obesityHyperlipidemia  Renal/GU   negative genitourinary   Musculoskeletal  (+) Arthritis , Osteoarthritis,    Abdominal (+) + obese,   Peds  Hematology negative hematology ROS (+)   Anesthesia Other Findings   Reproductive/Obstetrics                             Anesthesia Physical Anesthesia Plan  ASA: III  Anesthesia Plan: General   Post-op Pain Management:    Induction: Intravenous  PONV Risk Score and Plan: 4 or greater and Midazolam, Ondansetron, Dexamethasone and Treatment may vary due to age or medical condition  Airway Management Planned: LMA  Additional Equipment:   Intra-op Plan:   Post-operative Plan: Extubation in OR  Informed Consent: I have reviewed the patients History and Physical, chart, labs and discussed the procedure including the risks, benefits and alternatives for the proposed anesthesia with the patient or authorized representative who has indicated his/her understanding and acceptance.     Dental advisory given  Plan Discussed with: CRNA and Surgeon  Anesthesia Plan Comments:         Anesthesia Quick Evaluation

## 2019-05-22 NOTE — Transfer of Care (Signed)
Immediate Anesthesia Transfer of Care Note  Patient: Leah Hampton  Procedure(s) Performed: Procedure(s): DILATATION AND CURETTAGE /HYSTEROSCOPY, POLYPECTOMY WITH MYOSURE (N/A)  Patient Location: PACU  Anesthesia Type:General  Level of Consciousness:  sedated, patient cooperative and responds to stimulation  Airway & Oxygen Therapy:Patient Spontanous Breathing and Patient connected to face mask oxgen  Post-op Assessment:  Report given to PACU RN and Post -op Vital signs reviewed and stable  Post vital signs:  Reviewed and stable  Last Vitals:  Vitals:   05/22/19 1358 05/22/19 1516  BP: (!) 143/83 140/80  Pulse: 98 78  Resp: 18 (P) 12  Temp: 36.6 C (P) 36.7 C  SpO2: 123456 [id number]    Complications: No apparent anesthesia complications

## 2019-05-22 NOTE — Discharge Instructions (Signed)
Hysteroscopy, Care After This sheet gives you information about how to care for yourself after your procedure. Your health care provider may also give you more specific instructions. If you have problems or questions, contact your health care provider. What can I expect after the procedure? After the procedure, it is common to have:  Cramping.  Bleeding. This can vary from light spotting to menstrual-like bleeding. Follow these instructions at home: Activity  Rest for 1-2 days after the procedure.  Do not douche, use tampons, or have sex for 2 weeks after the procedure, or until your health care provider approves.  Do not drive for 24 hours after the procedure, or for as long as told by your health care provider.  Do not drive, use heavy machinery, or drink alcohol while taking prescription pain medicines. Medicines   Take over-the-counter and prescription medicines only as told by your health care provider.  Do not take aspirin during recovery. It can increase the risk of bleeding. General instructions  Do not take baths, swim, or use a hot tub until your health care provider approves. Take showers instead of baths for 2 weeks, or for as long as told by your health care provider.  To prevent or treat constipation while you are taking prescription pain medicine, your health care provider may recommend that you: ? Drink enough fluid to keep your urine clear or pale yellow. ? Take over-the-counter or prescription medicines. ? Eat foods that are high in fiber, such as fresh fruits and vegetables, whole grains, and beans. ? Limit foods that are high in fat and processed sugars, such as fried and sweet foods.  Keep all follow-up visits as told by your health care provider. This is important. Contact a health care provider if:  You feel dizzy or lightheaded.  You feel nauseous.  You have abnormal vaginal discharge.  You have a rash.  You have pain that does not get better with  medicine.  You have chills. Get help right away if:  You have bleeding that is heavier than a normal menstrual period.  You have a fever.  You have pain or cramps that get worse.  You develop new abdominal pain.  You faint.  You have pain in your shoulders.  You have shortness of breath. Summary  After the procedure, you may have cramping and some vaginal bleeding.  Do not douche, use tampons, or have sex for 2 weeks after the procedure, or until your health care provider approves.  Do not take baths, swim, or use a hot tub until your health care provider approves. Take showers instead of baths for 2 weeks, or for as long as told by your health care provider.  Report any unusual symptoms to your health care provider.  Keep all follow-up visits as told by your health care provider. This is important. This information is not intended to replace advice given to you by your health care provider. Make sure you discuss any questions you have with your health care provider. Document Revised: 04/15/2017 Document Reviewed: 06/01/2016 Elsevier Patient Education  2020 Elsevier Inc.  

## 2019-05-22 NOTE — Anesthesia Postprocedure Evaluation (Signed)
Anesthesia Post Note  Patient: Leah Hampton  Procedure(s) Performed: DILATATION AND CURETTAGE /HYSTEROSCOPY, POLYPECTOMY WITH MYOSURE (N/A )     Patient location during evaluation: PACU Anesthesia Type: General Level of consciousness: awake and alert and oriented Pain management: pain level controlled Vital Signs Assessment: post-procedure vital signs reviewed and stable Respiratory status: spontaneous breathing, nonlabored ventilation and respiratory function stable Cardiovascular status: blood pressure returned to baseline and stable Postop Assessment: no apparent nausea or vomiting Anesthetic complications: no    Last Vitals:  Vitals:   05/22/19 1620 05/22/19 1707  BP: 138/75 136/74  Pulse: 80 83  Resp: 18 20  Temp: 36.7 C   SpO2: 95% 99%    Last Pain:  Vitals:   05/22/19 1707  TempSrc:   PainSc: 0-No pain                 Banner Huckaba A.

## 2019-05-22 NOTE — H&P (Signed)
Preoperative History and Physical  Leah Hampton is a 64 y.o. G2P2000 here for surgical management of post menopausal bleeding.   Proposed surgery: hysteroscopy with dilation and curettage with possible polypectomy   Past Medical History:  Diagnosis Date  . Bronchitis   . Cystitis   . Diabetes type 2, controlled ([place])   . Hematuria    urologice eval, Dr Estill Dooms  . History of chest pain   . Hyperbilirubinemia   . Hyperlipemia   . Hypertelorism   . Hypertension   . Leukocytosis   . Obesity   . Osteoarthritis    right knee  . Sleep apnea 2012   slight   . Vaginal Pap smear, abnormal   . Vitamin D deficiency 02/09/2018   Past Surgical History:  Procedure Laterality Date  . COLONOSCOPY WITH PROPOFOL N/A 03/27/2015   Procedure: COLONOSCOPY WITH PROPOFOL;  Surgeon: Milus Banister, MD;  Location: [facility];  Service: Endoscopy;  Laterality: N/A;  . DILATATION & CURETTAGE/HYSTEROSCOPY WITH MYOSURE N/A 01/05/2017   Procedure: DILATATION & CURETTAGE/HYSTEROSCOPY WITH MYOSURE;  Surgeon: Thurnell Lose, MD;  Location: [facility];  Service: Gynecology;  Laterality: N/A;  PostMenopausal Bleeding  . KNEE ARTHROSCOPY  04/2007   left knee  . TOTAL KNEE ARTHROPLASTY  10/2008   Dr Roxine Caddy   OB History    Gravida  2   Para  2   Term  2   Preterm      AB      Living        SAB      TAB      Ectopic      Multiple      Live Births  2          Patient denies any cervical dysplasia or STIs. Medications Prior to Admission  Medication Sig Dispense Refill Last Dose  . b complex vitamins tablet Take 1 tablet by mouth daily.   Past Week at Unknown time  . blood glucose meter kit and supplies KIT Dispense based on patient and insurance preference. Use up to four times daily as directed. (FOR ICD-9 250.00, 250.01). 1 each 0 Past Week at Unknown time  . Cholecalciferol (VITAMIN D3) 125 MCG (5000 UT) TABS Take 5,000 Units by mouth daily in the afternoon.   Past Week at Unknown time   . FLOVENT HFA 110 MCG/ACT inhaler INHALE 2 PUFFS TWICE DAILY (Patient taking differently: Inhale 2 puffs into the lungs 2 (two) times daily. INHALE 2 PUFFS TWICE DAILY) 36 g 1 05/22/2019 at Unknown time  . ibuprofen (ADVIL,MOTRIN) 600 MG tablet Take 1 tablet (600 mg total) by mouth every 8 (eight) hours as needed. (Patient taking differently: Take 600 mg by mouth every 8 (eight) hours as needed (for pain). ) 30 tablet 0 Past Week at Unknown time  . losartan-hydrochlorothiazide (HYZAAR) 100-25 MG tablet Take 1 tablet by mouth daily. 90 tablet 3 05/21/2019 at Unknown time  . meloxicam (MOBIC) 7.5 MG tablet Take 1 tablet (7.5 mg total) by mouth daily as needed for pain. 30 tablet 1 Past Week at Unknown time  . omeprazole (PRILOSEC) 20 MG capsule TAKE 1 CAPSULE EVERY DAY (Patient taking differently: Take 20 mg by mouth daily before breakfast. ) 90 capsule 1 05/22/2019 at Unknown time  . potassium chloride SA (K-DUR) 20 MEQ tablet Take 1 tablet (20 mEq total) by mouth daily. (Patient taking differently: Take 20 mEq by mouth daily in the afternoon. ) 90 tablet 1 05/21/2019 at Unknown  time  . pravastatin (PRAVACHOL) 80 MG tablet Take 1 tablet (80 mg total) by mouth daily. (Patient taking differently: Take 80 mg by mouth every evening. ) 90 tablet 1 05/21/2019 at Unknown time  . valACYclovir (VALTREX) 1000 MG tablet TAKE 1 TABLET EVERY DAY (Patient taking differently: Take 1,000 mg by mouth daily. ) 90 tablet 1 05/22/2019 at Unknown time  . Cholecalciferol (VITAMIN D3) 75 MCG (3000 UT) TABS Take 1 tablet by mouth daily. (Patient not taking: Reported on 05/16/2019) 30 tablet 0 Not Taking at Unknown time  . clotrimazole-betamethasone (LOTRISONE) cream APPLY TOPICALLY TWICE DAILY (Patient not taking: Reported on 05/16/2019) 45 g 0 Not Taking at Unknown time    Allergies  Allergen Reactions  . Sulfamethoxazole-Trimethoprim [Bactrim] Itching  . Codeine Nausea And Vomiting and Anxiety   Social History:   reports that she  has never smoked. She has never used smokeless tobacco. She reports that she does not drink alcohol or use drugs. Family History  Problem Relation Age of Onset  . Heart failure Mother   . Arthritis Mother   . Hypertension Mother   . Stroke Mother   . Heart attack Father 95  . Diabetes Other   . Arthritis Other   . Stroke Other   . Coronary artery disease Other   . Heart failure Sister        x 2  . Colon cancer Neg Hx     Review of Systems: Noncontributory  PHYSICAL EXAM: Blood pressure (!) 143/83, pulse 98, temperature 97.9 F (36.6 C), temperature source Oral, resp. rate 18, height _0  (1.626 m), weight (!) 142.4 kg, last menstrual period 11/10/2011, SpO2 99 %. General appearance - alert, well appearing, and in no distress Chest - clear to auscultation, no wheezes, rales or rhonchi, symmetric air entry Heart - normal rate and regular rhythm Abdomen - soft, nontender, nondistended, no masses or organomegaly Pelvic - examination not indicated Extremities - peripheral pulses normal, no pedal edema, no clubbing or cyanosis  Labs: Results for orders placed or performed during the hospital encounter of 05/21/19 (from the past 336 hour(s))  CBC   Collection Time: 05/21/19  9:50 AM  Result Value Ref Range   WBC 8.4 4.0 - 10.5 K/uL   RBC 4.27 3.87 - 5.11 MIL/uL   Hemoglobin 12.0 12.0 - 15.0 g/dL   HCT 38.3 36.0 - 46.0 %   MCV 89.7 80.0 - 100.0 fL   MCH 28.1 26.0 - 34.0 pg   MCHC 31.3 30.0 - 36.0 g/dL   RDW 16.2 (H) 11.5 - 15.5 %   Platelets 286 150 - 400 K/uL   nRBC 0.0 0.0 - 0.2 %  Glucose, capillary   Collection Time: 05/21/19  9:57 AM  Result Value Ref Range   Glucose-Capillary 108 (H) 70 - 99 mg/dL  Results for orders placed or performed during the hospital encounter of 05/19/19 (from the past 336 hour(s))  SARS CORONAVIRUS 2 (TAT 6-24 HRS) Nasopharyngeal Nasopharyngeal Swab   Collection Time: 05/19/19 12:29 PM   Specimen: Nasopharyngeal Swab  Result Value Ref Range    SARS Coronavirus 2 NEGATIVE NEGATIVE    Imaging Studies: US PELVIC COMPLETE WITH TRANSVAGINAL  Result Date: 05/08/2019 CLINICAL DATA:  Postmenopausal bleeding EXAM: TRANSABDOMINAL AND TRANSVAGINAL ULTRASOUND OF PELVIS TECHNIQUE: Both transabdominal and transvaginal ultrasound examinations of the pelvis were performed. Transabdominal technique was performed for global imaging of the pelvis including uterus, ovaries, adnexal regions, and pelvic cul-de-sac. It was necessary to proceed with endovaginal  exam following the transabdominal exam to visualize the endometrium and ovaries. COMPARISON:  None FINDINGS: Uterus Measurements: 9.2 x 4.8 x 5.6 cm = volume: 130 mL. Normal morphology without mass Endometrium Thickness: 11 mm. Tiny endometrial cyst. No other focal abnormalities. Right ovary Not visualized on either transabdominal or endovaginal imaging, question atrophic versus obscured by bowel Left ovary Not visualized on either transabdominal or endovaginal imaging, question atrophic versus obscured by bowel Other findings No free pelvic fluid.  No adnexal masses. IMPRESSION: Nonvisualization of ovaries. Thickened endometrial complex 11 mm thick with a single tiny endometrial cyst; thickness is abnormal for a postmenopausal patient with bleeding. In the setting of post-menopausal bleeding, endometrial sampling is indicated to exclude carcinoma. If results are benign, sonohysterogram should be considered for focal lesion work-up. (Ref: Radiological Reasoning: Algorithmic Workup of Abnormal Vaginal Bleeding with Endovaginal Sonography and Sonohysterography. AJR 2008; [id number]) These results will be called to the ordering clinician or representative by the Radiologist Assistant, and communication documented in the PACS or zVision Dashboard. Electronically Signed   By: Lavonia Dana M.D.   On: 05/08/2019 12:34    Assessment: Patient Active Problem List   Diagnosis Date Noted  . Post-menopausal bleeding  05/16/2019  . Greater trochanteric pain syndrome of left lower extremity 03/20/2019  . Diabetes type 2, controlled ([place]) 05/18/2018  . Vitamin D deficiency 02/09/2018  . Onychomycosis 09/19/2017  . Tinea pedis of both feet 09/19/2017  . Obstructive chronic bronchitis with exacerbation ([place]) 05/23/2017  . Elevated hemoglobin A1c 06/22/2016  . Genital herpes 05/23/2012  . Stress incontinence 03/04/2012  . Osteoarthritis of right knee 10/12/2011  . CTS (carpal tunnel syndrome) 07/13/2011  . OSA (obstructive sleep apnea) 12/10/2010  . Menorrhagia 10/07/2010  . BACK PAIN, LUMBAR 08/01/2009  . Morbid obesity, unspecified obesity type ([place]) 07/31/2008  . Hyperlipidemia 07/03/2008  . Essential hypertension 07/03/2008  . HYPERBILIRUBINEMIA 07/03/2008    Plan: Patient will undergo surgical management with hysteroscopy with dilation and curettage with possible polypectomy.   The risks of surgery were discussed in detail with the patient including but not limited to: bleeding which may require transfusion or reoperation; infection which may require antibiotics; injury to surrounding organs which may involve bowel, bladder, ureters ; need for additional procedures including laparoscopy or laparotomy; thromboembolic phenomenon, surgical site problems and other postoperative/anesthesia complications. Likelihood of success in alleviating the patient's condition was discussed. Routine postoperative instructions will be reviewed with the patient and her family in detail after surgery.  The patient concurred with the proposed plan, giving informed written consent for the surgery.  Patient has been NPO since last night she will remain NPO for procedure.  Anesthesia and OR aware.  Preoperative prophylactic antibiotics and SCDs ordered on call to the OR.  To OR when ready.  Autymn Omlor L. Ihor Dow, M.D., [facility] 05/22/2019 2:15 PM

## 2019-05-23 LAB — SURGICAL PATHOLOGY

## 2019-05-28 ENCOUNTER — Other Ambulatory Visit: Payer: Self-pay | Admitting: Family Medicine

## 2019-05-28 ENCOUNTER — Encounter: Payer: Self-pay | Admitting: Family Medicine

## 2019-05-28 ENCOUNTER — Ambulatory Visit (HOSPITAL_BASED_OUTPATIENT_CLINIC_OR_DEPARTMENT_OTHER)
Admission: RE | Admit: 2019-05-28 | Discharge: 2019-05-28 | Disposition: A | Discharge status: Home / Self Care | Payer: Medicare Other | Source: Ambulatory Visit | Attending: Family Medicine | Admitting: Family Medicine

## 2019-05-28 ENCOUNTER — Other Ambulatory Visit: Payer: Self-pay

## 2019-05-28 ENCOUNTER — Ambulatory Visit: Payer: Medicare HMO | Admitting: Obstetrics & Gynecology

## 2019-05-28 ENCOUNTER — Ambulatory Visit (INDEPENDENT_AMBULATORY_CARE_PROVIDER_SITE_OTHER): Payer: Medicare Other | Admitting: Family Medicine

## 2019-05-28 VITALS — BP 114/76 | HR 85 | Ht 65.0 in | Wt 312.0 lb

## 2019-05-28 DIAGNOSIS — M25552 Pain in left hip: Secondary | ICD-10-CM

## 2019-05-28 DIAGNOSIS — M25551 Pain in right hip: Secondary | ICD-10-CM | POA: Insufficient documentation

## 2019-05-28 DIAGNOSIS — M1711 Unilateral primary osteoarthritis, right knee: Secondary | ICD-10-CM | POA: Insufficient documentation

## 2019-05-28 MED ORDER — BACLOFEN 10 MG PO TABS: 5.0000 mg | ORAL_TABLET | Freq: Two times a day (BID) | ORAL | 0 refills | Status: DC | PRN

## 2019-05-28 NOTE — Assessment & Plan Note (Addendum)
Acute on chronic in nature.  Little improvement with the steroid injection. -X-ray. -Referral to physical therapy. -Try the custom medial unloader brace due to thigh to calf ratio. -Could consider gel injections.

## 2019-05-28 NOTE — Patient Instructions (Signed)
Good to see you Please try ice for the knee  Physical therapy will give you a call  Please try the baclofen. This may make you sleepy.  I will call with the xray results.   Please send me a message in MyChart with any questions or updates.  Please see me back in 6 weeks.   --Dr. Raeford Razor

## 2019-05-28 NOTE — Assessment & Plan Note (Signed)
Mild improvement with the greater trochanter injection.  May be more of a joint issue or of the lower back. -X-ray. -Referral to physical therapy. -Could consider joint injection.

## 2019-05-28 NOTE — Progress Notes (Addendum)
ARDEAN POLHAMUS - 64 y.o. female MRN [id number]  Date of birth: 17-Sep-1955  SUBJECTIVE:  Including CC & ROS.  Chief Complaint  Patient presents with  . Follow-up    follow up for right knee   . Hip Pain    left hip    LOUVENA FESS is a 65 y.o. female that is presenting with worsening right knee pain and left hip pain.  The right knee is acute on chronic in nature.  It seems to be occurring over the medial joint line.  She got limited relief with the steroid injection.  She has done gel injections in the past with no relief.  Denies any mechanical symptoms.  Symptoms seem to be worse with ambulation.  They are localized to the knee..  The left hip is occurring near the iliac crest more than the greater trochanter.  Seems to be posteriorly of the left hip.  It is worse when she is getting up from a seated position.  It is relieved with sitting.  No radicular symptoms.  No history of surgery on the left hip.  Does have a history of total left knee arthroplasty.  No numbness or tingling   Review of Systems See HPI   HISTORY: Past Medical, Surgical, Social, and Family History Reviewed & Updated per EMR.   Pertinent Historical Findings include:  Past Medical History:  Diagnosis Date  . Bronchitis   . Cystitis   . Diabetes type 2, controlled ([place])   . Hematuria    urologice eval, Dr Estill Dooms  . History of chest pain   . Hyperbilirubinemia   . Hyperlipemia   . Hypertelorism   . Hypertension   . Leukocytosis   . Obesity   . Osteoarthritis    right knee  . Sleep apnea 2012   slight   . Vaginal Pap smear, abnormal   . Vitamin D deficiency 02/09/2018    Past Surgical History:  Procedure Laterality Date  . COLONOSCOPY WITH PROPOFOL N/A 03/27/2015   Procedure: COLONOSCOPY WITH PROPOFOL;  Surgeon: Milus Banister, MD;  Location: [facility];  Service: Endoscopy;  Laterality: N/A;  . DILATATION & CURETTAGE/HYSTEROSCOPY WITH MYOSURE N/A 01/05/2017   Procedure: DILATATION &  CURETTAGE/HYSTEROSCOPY WITH MYOSURE;  Surgeon: Thurnell Lose, MD;  Location: [facility];  Service: Gynecology;  Laterality: N/A;  PostMenopausal Bleeding  . HYSTEROSCOPY WITH D & C N/A 05/22/2019   Procedure: DILATATION AND CURETTAGE /HYSTEROSCOPY, POLYPECTOMY WITH MYOSURE;  Surgeon: Lavonia Drafts, MD;  Location: [facility];  Service: Gynecology;  Laterality: N/A;  . KNEE ARTHROSCOPY  04/2007   left knee  . TOTAL KNEE ARTHROPLASTY  10/2008   Dr Roxine Caddy    Allergies  Allergen Reactions  . Sulfamethoxazole-Trimethoprim [Bactrim] Itching  . Codeine Nausea And Vomiting and Anxiety    Family History  Problem Relation Age of Onset  . Heart failure Mother   . Arthritis Mother   . Hypertension Mother   . Stroke Mother   . Heart attack Father 85  . Diabetes Other   . Arthritis Other   . Stroke Other   . Coronary artery disease Other   . Heart failure Sister        x 2  . Colon cancer Neg Hx      Social History   Socioeconomic History  . Marital status: Single    Spouse name: Not on file  . Number of children: 2  . Years of education: Not on file  . Highest education level:  Not on file  Occupational History  . Occupation: disabled  Tobacco Use  . Smoking status: Never Smoker  . Smokeless tobacco: Never Used  Substance and Sexual Activity  . Alcohol use: No  . Drug use: No  . Sexual activity: Not Currently  Other Topics Concern  . Not on file  Social History Narrative   Regular exercise:  No      Lives alone   Has 2 grown sons, both local.  1 grandson   On disability due to her knee pain   Completed 12th grade   Enjoys television   No pets.    Social Determinants of Health   Financial Resource Strain:   . Difficulty of Paying Living Expenses: Not on file  Food Insecurity:   . Worried About Charity fundraiser in the Last Year: Not on file  . Ran Out of Food in the Last Year: Not on file  Transportation Needs:   . Lack of Transportation (Medical): Not on file    . Lack of Transportation (Non-Medical): Not on file  Physical Activity:   . Days of Exercise per Week: Not on file  . Minutes of Exercise per Session: Not on file  Stress:   . Feeling of Stress : Not on file  Social Connections:   . Frequency of Communication with Friends and Family: Not on file  . Frequency of Social Gatherings with Friends and Family: Not on file  . Attends Religious Services: Not on file  . Active Member of Clubs or Organizations: Not on file  . Attends Archivist Meetings: Not on file  . Marital Status: Not on file  Intimate Partner Violence:   . Fear of Current or Ex-Partner: Not on file  . Emotionally Abused: Not on file  . Physically Abused: Not on file  . Sexually Abused: Not on file     PHYSICAL EXAM:  VS: BP 114/76   Pulse 85   Ht 5\' 5"  (1.651 m)   Wt (!) 312 lb (141.5 kg)   LMP 11/10/2011   BMI 51.92 kg/m  Physical Exam Gen: NAD, alert, cooperative with exam, well-appearing ENT: normal lips, normal nasal mucosa,  Eye: normal EOM, normal conjunctiva and lids Skin: no rashes, no areas of induration  Neuro: normal tone, normal sensation to touch Psych:  normal insight, alert and oriented MSK:  Right knee: No obvious effusion. Normal range of motion. Instability with valgus and varus stress testing. Negative McMurray's test. Left hip: Tenderness to palpation over the origin of the gluteus medius and minimus. No tenderness palpation of the greater trochanter. Pain with hip flexion. Negative straight leg raise. Neurovascularly intact     ASSESSMENT & PLAN:   Osteoarthritis of right knee Acute on chronic in nature.  Little improvement with the steroid injection. -X-ray. -Referral to physical therapy. -Try the custom medial unloader brace due to thigh to calf ratio. -Could consider gel injections.  Greater trochanteric pain syndrome of left lower extremity Mild improvement with the greater trochanter injection.  May be more  of a joint issue or of the lower back. -X-ray. -Referral to physical therapy. -Could consider joint injection.  Right hip pain Occurring intermittently.  Has feels similar to the contralateral side.  May be more of a lower back issue. -X-ray. -Could consider greater trochanter or joint injection.

## 2019-05-28 NOTE — Assessment & Plan Note (Signed)
Occurring intermittently.  Has feels similar to the contralateral side.  May be more of a lower back issue. -X-ray. -Could consider greater trochanter or joint injection.

## 2019-05-29 ENCOUNTER — Telehealth: Payer: Self-pay | Admitting: Family Medicine

## 2019-05-29 NOTE — Telephone Encounter (Signed)
Informed of results.   Rosemarie Ax, MD [facility] 05/29/2019, 11:12 AM

## 2019-06-04 ENCOUNTER — Ambulatory Visit: Payer: Medicare Other | Attending: Family Medicine | Admitting: Physical Therapy

## 2019-06-04 ENCOUNTER — Other Ambulatory Visit: Payer: Self-pay

## 2019-06-04 ENCOUNTER — Encounter: Payer: Self-pay | Admitting: Physical Therapy

## 2019-06-04 DIAGNOSIS — M6281 Muscle weakness (generalized): Secondary | ICD-10-CM | POA: Diagnosis present

## 2019-06-04 DIAGNOSIS — M25551 Pain in right hip: Secondary | ICD-10-CM | POA: Insufficient documentation

## 2019-06-04 DIAGNOSIS — M25561 Pain in right knee: Secondary | ICD-10-CM | POA: Diagnosis present

## 2019-06-04 DIAGNOSIS — M25552 Pain in left hip: Secondary | ICD-10-CM

## 2019-06-04 DIAGNOSIS — G8929 Other chronic pain: Secondary | ICD-10-CM

## 2019-06-04 NOTE — Therapy (Signed)
[place], [place], [address] Phone: [phone]   Fax:  [phone]  Physical Therapy Evaluation  Patient Details  Name: Leah Hampton MRN: [id number] Date of Birth: 18-Apr-1956 Referring Provider (PT): Cora Daniels, MD   Encounter Date: 06/04/2019  PT End of Session - 06/04/19 1155    Visit Number  1    Number of Visits  13    Date for PT Re-Evaluation  08/01/19    PT Start Time  [id number]    PT Stop Time  1230    PT Time Calculation (min)  45 min    Equipment Utilized During Treatment  Gait belt    Activity Tolerance  Patient tolerated treatment well    Behavior During Therapy  [facility] for tasks assessed/performed       Past Medical History:  Diagnosis Date  . Bronchitis   . Cystitis   . Diabetes type 2, controlled ([place])   . Hematuria    urologice eval, Dr Estill Dooms  . History of chest pain   . Hyperbilirubinemia   . Hyperlipemia   . Hypertelorism   . Hypertension   . Leukocytosis   . Obesity   . Osteoarthritis    right knee  . Sleep apnea 2012   slight   . Vaginal Pap smear, abnormal   . Vitamin D deficiency 02/09/2018    Past Surgical History:  Procedure Laterality Date  . COLONOSCOPY WITH PROPOFOL N/A 03/27/2015   Procedure: COLONOSCOPY WITH PROPOFOL;  Surgeon: Milus Banister, MD;  Location: [facility];  Service: Endoscopy;  Laterality: N/A;  . DILATATION & CURETTAGE/HYSTEROSCOPY WITH MYOSURE N/A 01/05/2017   Procedure: DILATATION & CURETTAGE/HYSTEROSCOPY WITH MYOSURE;  Surgeon: Thurnell Lose, MD;  Location: [facility];  Service: Gynecology;  Laterality: N/A;  PostMenopausal Bleeding  . HYSTEROSCOPY WITH D & C N/A 05/22/2019   Procedure: DILATATION AND CURETTAGE /HYSTEROSCOPY, POLYPECTOMY WITH MYOSURE;  Surgeon: Lavonia Drafts, MD;  Location: [facility];  Service: Gynecology;  Laterality: N/A;  . KNEE ARTHROSCOPY  04/2007   left knee  . TOTAL KNEE ARTHROPLASTY  10/2008   Dr Roxine Caddy     There were no vitals filed for this visit.   Subjective Assessment - 06/04/19 1145    Subjective  Pt arriving to therapy reporting R knee pain and L hip pain. Pt also reporting mild R hip pain at times. Pain has been ongoing for several years. Pt stating she wants to lose weight. Pt reporting difficulty and pain with ADL's. Pt reports she uses a rollator walker to sit while she cooks. Pt amb into clinic with antalgic gait. Pt using no device in clinic. Pt reports that when she is hurting bad she will take her cane if she has to go outside the home. Pt reporting diffculty standing in the shower.    Pertinent History  h/o L TKA    How long can you sit comfortably?  unlimited    How long can you stand comfortably?  5 minutes    How long can you walk comfortably?  10 minutes    Diagnostic tests  x-ray last week    Patient Stated Goals  Lose weight, get rid of my pain    Currently in Pain?  Yes    Pain Score  7     Pain Location  Knee    Pain Orientation  Right    Pain Descriptors / Indicators  Aching    Pain Type  Chronic pain  Pain Onset  More than a month ago    Pain Frequency  Constant    Aggravating Factors   standing long periods, walking, when you first stand up    Pain Relieving Factors  ice seems to help a little bit    Effect of Pain on Daily Activities  difficulty with showering, donning socks and shoes, cleaning and cooking    Multiple Pain Sites  Yes    Pain Score  7    Pain Location  Hip    Pain Orientation  Left    Pain Descriptors / Indicators  Pressure    Pain Type  Chronic pain    Pain Onset  More than a month ago    Pain Frequency  Constant    Aggravating Factors   bending, standing, walking    Pain Relieving Factors  sitting, heating pad    Effect of Pain on Daily Activities  difficulty with ADL's and house hold chores         [facility] PT Assessment - 06/04/19 0001      Assessment   Medical Diagnosis  R knee pain, L hip pain, m17.11, M25.552,     Referring  Provider (PT)  Cora Daniels, MD    Onset Date/Surgical Date  --   years ago   Hand Dominance  Right    Prior Therapy  yes folllowing L TKA      Precautions   Precautions  None      Restrictions   Weight Bearing Restrictions  No      Balance Screen   Has the patient fallen in the past 6 months  No    Is the patient reluctant to leave their home because of a fear of falling?   No      Home Environment   Living Environment  Private residence    Living Arrangements  Children    Type of Home  Apartment    Home Access  Level entry    Home Layout  Two level    Additional Comments  cane, rollator walker , pt reported she ordered a seat for her shower      Prior Function   Level of Independence  Independent    Vocation  On disability    Leisure  cooking      Cognition   Overall Cognitive Status  Within Functional Limits for tasks assessed      Observation/Other Assessments   Focus on Therapeutic Outcomes (FOTO)   56 % limitation      Posture/Postural Control   Posture/Postural Control  Postural limitations    Postural Limitations  Rounded Shoulders;Forward head;Increased lumbar lordosis      ROM / Strength   AROM / PROM / Strength  AROM;Strength      AROM   Overall AROM   Deficits    AROM Assessment Site  Knee    Right/Left Knee  Right;Left    Right Knee Extension  0    Right Knee Flexion  65    Left Knee Extension  0    Left Knee Flexion  100      Strength   Overall Strength  Deficits    Strength Assessment Site  Hip;Knee    Right/Left Hip  Right;Left    Right Hip Flexion  3+/5    Right Hip ABduction  3+/5    Right Hip ADduction  3+/5    Left Hip Flexion  3+/5    Left Hip ABduction  3+/5  Left Hip ADduction  3+/5    Right/Left Knee  Right;Left    Right Knee Flexion  4-/5    Right Knee Extension  4-/5    Left Knee Flexion  4/5    Left Knee Extension  4/5      Palpation   Palpation comment  TTP on R knee medial and lateral and inferior patella tendon,  Left Lateral greater trochanter       Transfers   Five time sit to stand comments   21 seconds using UE support      Functional Gait  Assessment   Gait assessed   No                Objective measurements completed on examination: See above findings.              PT Education - 06/04/19 1153    Education Details  PT POC, HEP    Person(s) Educated  Patient    Methods  Explanation;Demonstration    Comprehension  Verbalized understanding;Returned demonstration          PT Long Term Goals - 06/04/19 1523      PT LONG TERM GOAL #1   Title  Pt will be able to perform 5 time sit to stand in </= 15 seconds using no UE support.    Baseline  21 seconds, using UE support    Time  6    Period  Weeks    Status  New    Target Date  07/16/19      PT LONG TERM GOAL #2   Title  Pt will improve her R knee flexion to >/= 90 degrees in order to improve functional mobiltiy and gait.    Baseline  0-65 degrees    Time  6    Period  Weeks    Status  New    Target Date  07/16/19      PT LONG TERM GOAL #3   Title  Pt will improve her bilateral LE strength to >/= 4/5 in order to improve gait and functional mobility.    Baseline  R knee 3+/5, R and L hip grossly 4-/5 (see flowsheets)    Time  6    Period  Weeks    Status  New    Target Date  07/16/19      PT LONG TERM GOAL #4   Title  Pt will improve her FOTO score from 56% limitation to </= 43 % limitation.    Baseline  56% limitation    Time  6    Period  Weeks    Status  New    Target Date  07/16/19             Plan - 06/04/19 1316    Clinical Impression Statement  Pt presenting today to physical therapy for evaluation of R knee pain and L hip pain. Pt also reporting R hip pain. Pt with dx of primary OA in R knee, L greater trochanger pain syndrome. Pt reports having a Left Total knee replacement years ago. Pt amb with no device with antalgic gait pattern with decreased hip and knee flexion on the R with  mild circumduction. Pt was edu in heel to toe gait pattern and increasd knee and hip flexion. Pt with weakness noted in bilateral hips and R knee. Pt also with limited ROM in R knee compared to the left. Skilled PT needed to address pt's impairments with the below  interventions.    Personal Factors and Comorbidities  Comorbidity 3+    Comorbidities  OA, HTN, obesity, h/o chest pains, DM2, bronchitis, leukocytosis, L knee arthroscopy, L knee arthroplasty 2010    Examination-Activity Limitations  Bathing;Carry;Squat;Stairs;Stand;Transfers    Examination-Participation Restrictions  Community Activity;Other    Stability/Clinical Decision Making  Evolving/Moderate complexity    Clinical Decision Making  Moderate    Rehab Potential  Good    PT Frequency  2x / week    PT Duration  6 weeks    PT Treatment/Interventions  ADLs/Self Care Home Management;Cryotherapy;Electrical Stimulation;Traction;Neuromuscular re-education;Balance training;Therapeutic exercise;Therapeutic activities;Gait training;Stair training;Functional mobility training;Ultrasound;Patient/family education;Manual techniques;Passive range of motion;Dry needling;Taping    PT Next Visit Plan  knee ROM, knee strengthening, Hamstring stretching, gait training, balance, Hip stretching/strengthening    PT Home Exercise Plan  Access Code: [email or web address]    Consulted and Agree with Plan of Care  Patient       Patient will benefit from skilled therapeutic intervention in order to improve the following deficits and impairments:  Pain, Decreased activity tolerance, Decreased strength, Obesity, Decreased range of motion, Increased edema, Difficulty walking, Abnormal gait  Visit Diagnosis: Chronic pain of right knee  Pain in left hip  Pain in right hip  Muscle weakness (generalized)     Problem List Patient Active Problem List   Diagnosis Date Noted  . Right hip pain 05/28/2019  . Post-menopausal bleeding 05/16/2019  . Greater trochanteric  pain syndrome of left lower extremity 03/20/2019  . Diabetes type 2, controlled ([place]) 05/18/2018  . Vitamin D deficiency 02/09/2018  . Onychomycosis 09/19/2017  . Tinea pedis of both feet 09/19/2017  . Obstructive chronic bronchitis with exacerbation ([place]) 05/23/2017  . Elevated hemoglobin A1c 06/22/2016  . Genital herpes 05/23/2012  . Stress incontinence 03/04/2012  . Osteoarthritis of right knee 10/12/2011  . CTS (carpal tunnel syndrome) 07/13/2011  . OSA (obstructive sleep apnea) 12/10/2010  . Menorrhagia 10/07/2010  . BACK PAIN, LUMBAR 08/01/2009  . Morbid obesity, unspecified obesity type ([place]) 07/31/2008  . Hyperlipidemia 07/03/2008  . Essential hypertension 07/03/2008  . HYPERBILIRUBINEMIA 07/03/2008   Kearney Hard, PT 06/04/19 3:31 PM    Oretha Caprice 06/04/2019, 3:31 PM  [facility] [address] [place], [place], [address] Phone: [phone]   Fax:  [phone]  Name: Leah Hampton MRN: [id number] Date of Birth: May 30, 1955

## 2019-06-06 ENCOUNTER — Ambulatory Visit: Payer: Medicare Other | Admitting: Physical Therapy

## 2019-06-06 ENCOUNTER — Encounter: Payer: Self-pay | Admitting: Physical Therapy

## 2019-06-06 ENCOUNTER — Other Ambulatory Visit: Payer: Self-pay

## 2019-06-06 DIAGNOSIS — M6281 Muscle weakness (generalized): Secondary | ICD-10-CM

## 2019-06-06 DIAGNOSIS — M25552 Pain in left hip: Secondary | ICD-10-CM

## 2019-06-06 DIAGNOSIS — M25561 Pain in right knee: Secondary | ICD-10-CM

## 2019-06-06 DIAGNOSIS — M25551 Pain in right hip: Secondary | ICD-10-CM

## 2019-06-06 DIAGNOSIS — G8929 Other chronic pain: Secondary | ICD-10-CM

## 2019-06-06 NOTE — Therapy (Signed)
[place], [place], [address] Phone: [phone]   Fax:  [phone]  Physical Therapy Treatment  Patient Details  Name: Leah Hampton MRN: [id number] Date of Birth: Apr 07, 1956 Referring Provider (PT): Cora Daniels, MD   Encounter Date: 06/06/2019  PT End of Session - 06/06/19 1100    Visit Number  2    Number of Visits  13    Date for PT Re-Evaluation  08/01/19    Authorization Type  [employer] MCR    PT Start Time  [id number]    PT Stop Time  1125    PT Time Calculation (min)  40 min    Activity Tolerance  Patient tolerated treatment well    Behavior During Therapy  [facility] for tasks assessed/performed       Past Medical History:  Diagnosis Date  . Bronchitis   . Cystitis   . Diabetes type 2, controlled ([place])   . Hematuria    urologice eval, Dr Estill Dooms  . History of chest pain   . Hyperbilirubinemia   . Hyperlipemia   . Hypertelorism   . Hypertension   . Leukocytosis   . Obesity   . Osteoarthritis    right knee  . Sleep apnea 2012   slight   . Vaginal Pap smear, abnormal   . Vitamin D deficiency 02/09/2018    Past Surgical History:  Procedure Laterality Date  . COLONOSCOPY WITH PROPOFOL N/A 03/27/2015   Procedure: COLONOSCOPY WITH PROPOFOL;  Surgeon: Milus Banister, MD;  Location: [facility];  Service: Endoscopy;  Laterality: N/A;  . DILATATION & CURETTAGE/HYSTEROSCOPY WITH MYOSURE N/A 01/05/2017   Procedure: DILATATION & CURETTAGE/HYSTEROSCOPY WITH MYOSURE;  Surgeon: Thurnell Lose, MD;  Location: [facility];  Service: Gynecology;  Laterality: N/A;  PostMenopausal Bleeding  . HYSTEROSCOPY WITH D & C N/A 05/22/2019   Procedure: DILATATION AND CURETTAGE /HYSTEROSCOPY, POLYPECTOMY WITH MYOSURE;  Surgeon: Lavonia Drafts, MD;  Location: [facility];  Service: Gynecology;  Laterality: N/A;  . KNEE ARTHROSCOPY  04/2007   left knee  . TOTAL KNEE ARTHROPLASTY  10/2008   Dr Roxine Caddy    There were no  vitals filed for this visit.  Subjective Assessment - 06/06/19 1056    Subjective  Patient reports she is sore from the exercises.    Currently in Pain?  Yes    Pain Score  8     Pain Location  Knee    Pain Orientation  Right    Pain Descriptors / Indicators  Aching    Pain Type  Chronic pain    Pain Onset  More than a month ago    Pain Frequency  Constant    Pain Score  5    Pain Location  Hip    Pain Orientation  Left    Pain Type  Chronic pain    Pain Onset  More than a month ago    Pain Frequency  Intermittent                       [facility] Adult PT Treatment/Exercise - 06/06/19 0001      Exercises   Exercises  Knee/Hip      Knee/Hip Exercises: Aerobic   Nustep  L4 x5 min with UE/LE      Knee/Hip Exercises: Seated   Long Arc Quad  2 sets;10 reps    Long Arc Quad Limitations  VC for controlled movement      Knee/Hip Exercises:  Supine   Quad Sets  3 sets;10 reps   3 seconds   Quad Sets Limitations  ball under knee for tactile cue, VC for quad activation    Short Arc [employer]  2 sets;10 reps    Short Arc Quad Sets Limitations  VC for technique    Hip Adduction Isometric  2 sets;10 reps   5 sec hold   Hip Adduction Isometric Limitations  hooklying with ball between knees    Bridges  2 sets;15 reps    Bridges Limitations  Legs on bolster, patient unable to clear hips from table so more of a posterior pelvic tilt      Knee/Hip Exercises: Sidelying   Hip ABduction  4 sets;5 reps   2x10 on left   Hip ABduction Limitations  patient required rest breaks after 5 reps, VC to avoid rolling hip backward             PT Education - 06/06/19 1059    Education Details  HEP    Person(s) Educated  Patient    Methods  Explanation;Demonstration;Verbal cues;Handout    Comprehension  Verbalized understanding;Returned demonstration;Verbal cues required;Need further instruction          PT Long Term Goals - 06/04/19 1523      PT LONG TERM GOAL #1   Title   Pt will be able to perform 5 time sit to stand in </= 15 seconds using no UE support.    Baseline  21 seconds, using UE support    Time  6    Period  Weeks    Status  New    Target Date  07/16/19      PT LONG TERM GOAL #2   Title  Pt will improve her R knee flexion to >/= 90 degrees in order to improve functional mobiltiy and gait.    Baseline  0-65 degrees    Time  6    Period  Weeks    Status  New    Target Date  07/16/19      PT LONG TERM GOAL #3   Title  Pt will improve her bilateral LE strength to >/= 4/5 in order to improve gait and functional mobility.    Baseline  R knee 3+/5, R and L hip grossly 4-/5 (see flowsheets)    Time  6    Period  Weeks    Status  New    Target Date  07/16/19      PT LONG TERM GOAL #4   Title  Pt will improve her FOTO score from 56% limitation to </= 43 % limitation.    Baseline  56% limitation    Time  6    Period  Weeks    Status  New    Target Date  07/16/19            Plan - 06/06/19 1102    Clinical Impression Statement  Patient did well with the strengthening progressions this visit. She continues to report right knee pain with exercises and any weight bearing, and left hip pain mainly with weight bearing tasks. Her HEP was progressed to include further knee and hip strengthening exercises and she was encouraged to take short frequent walks throughout the day. She would benefit from continued skilled PT to progress her mobility and strength to improve walking ability.    PT Treatment/Interventions  ADLs/Self Care Home Management;Cryotherapy;Electrical Stimulation;Traction;Neuromuscular re-education;Balance training;Therapeutic exercise;Therapeutic activities;Gait training;Stair training;Functional mobility training;Ultrasound;Patient/family education;Manual techniques;Passive range of  motion;Dry needling;Taping    PT Next Visit Plan  knee ROM, knee strengthening, Hamstring stretching, gait training, balance, Hip  stretching/strengthening    PT Home Exercise Plan  [email or web address]: supine clamshell with red (progressing to green), side lying hip abduction, SAQ, seated marching, seated knee flexion/extension AROM    Consulted and Agree with Plan of Care  Patient       Patient will benefit from skilled therapeutic intervention in order to improve the following deficits and impairments:  Pain, Decreased activity tolerance, Decreased strength, Obesity, Decreased range of motion, Increased edema, Difficulty walking, Abnormal gait  Visit Diagnosis: Chronic pain of right knee  Pain in left hip  Pain in right hip  Muscle weakness (generalized)     Problem List Patient Active Problem List   Diagnosis Date Noted  . Right hip pain 05/28/2019  . Post-menopausal bleeding 05/16/2019  . Greater trochanteric pain syndrome of left lower extremity 03/20/2019  . Diabetes type 2, controlled ([place]) 05/18/2018  . Vitamin D deficiency 02/09/2018  . Onychomycosis 09/19/2017  . Tinea pedis of both feet 09/19/2017  . Obstructive chronic bronchitis with exacerbation ([place]) 05/23/2017  . Elevated hemoglobin A1c 06/22/2016  . Genital herpes 05/23/2012  . Stress incontinence 03/04/2012  . Osteoarthritis of right knee 10/12/2011  . CTS (carpal tunnel syndrome) 07/13/2011  . OSA (obstructive sleep apnea) 12/10/2010  . Menorrhagia 10/07/2010  . BACK PAIN, LUMBAR 08/01/2009  . Morbid obesity, unspecified obesity type ([place]) 07/31/2008  . Hyperlipidemia 07/03/2008  . Essential hypertension 07/03/2008  . HYPERBILIRUBINEMIA 07/03/2008    Hilda Blades, PT, DPT, LAT, ATC 06/06/19  11:36 AM Phone: [phone] Fax: [facility] [address] [place], [place], [address] Phone: [phone]   Fax:  [phone]  Name: Leah Hampton MRN: [id number] Date of Birth: 09-09-1955

## 2019-06-06 NOTE — Patient Instructions (Signed)
Access Code: [email or web address]  URL: [email or web address]  Date: 06/06/2019  Prepared by: Hilda Blades   Exercises Seated Knee Flexion Extension AROM - 10 reps - 3x daily - 7x weekly Hooklying Clamshell with Resistance - 10 reps - 3 seconds hold - 3x daily - 7x weekly Seated March - 10 reps - 3x daily - 7x weekly Sidelying Hip Abduction - 5 reps - 4 sets - 1x daily - 7x weekly Supine Short Arc Quad - 10 reps - 2 sets - 1x daily - 7x weekly

## 2019-06-11 ENCOUNTER — Other Ambulatory Visit: Payer: Self-pay

## 2019-06-11 ENCOUNTER — Ambulatory Visit (INDEPENDENT_AMBULATORY_CARE_PROVIDER_SITE_OTHER): Payer: Medicare Other | Admitting: Obstetrics & Gynecology

## 2019-06-11 ENCOUNTER — Encounter: Payer: Self-pay | Admitting: Obstetrics & Gynecology

## 2019-06-11 VITALS — BP 116/57 | HR 96 | Ht 65.0 in | Wt 313.0 lb

## 2019-06-11 DIAGNOSIS — N950 Postmenopausal bleeding: Secondary | ICD-10-CM

## 2019-06-11 DIAGNOSIS — Z9889 Other specified postprocedural states: Secondary | ICD-10-CM | POA: Diagnosis not present

## 2019-06-11 NOTE — Progress Notes (Signed)
History:  64 y.o. G2P2000 here today for 2 weeks post op check. Pt is s/p D&C and resection of polyp. She reports that she is no longer having bleeding. She denies pain or other sx.    The following portions of the patient's history were reviewed and updated as appropriate: allergies, current medications, past family history, past medical history, past social history, past surgical history and problem list.  Review of Systems:  Pertinent items are noted in HPI.    Objective:  Physical Exam Blood pressure (!) 116/57, pulse 96, height 5\' 5"  (1.651 m), weight (!) 313 lb (142 kg), last menstrual period 11/10/2011.  CONSTITUTIONAL: Well-developed, well-nourished female in no acute distress.  HENT:  Normocephalic, atraumatic EYES: Conjunctivae and EOM are normal. No scleral icterus.  NECK: Normal range of motion SKIN: Skin is warm and dry. No rash noted. Not diaphoretic.No pallor. [place]: Alert and oriented to person, place, and time. Normal coordination.  Pelvic: not indicated.   Labs and Imaging 05/22/2019 ENDOMETRIUM, POLYPECTOMY:  - Disordered proliferative endometrium.  - Focal changes suggestive of endometrial polyp.  - No atypia or malignancy.   Assessment & Plan:  PMP- resolved Post op state- improved.   Rec f/u in 1 year or sooner prn   Brien Lowe L. Harraway-Smith, M.D., [name]

## 2019-06-13 ENCOUNTER — Other Ambulatory Visit: Payer: Self-pay

## 2019-06-13 ENCOUNTER — Encounter: Payer: Self-pay | Admitting: Physical Therapy

## 2019-06-13 ENCOUNTER — Ambulatory Visit: Payer: Medicare Other | Admitting: Physical Therapy

## 2019-06-13 DIAGNOSIS — M25561 Pain in right knee: Secondary | ICD-10-CM | POA: Diagnosis not present

## 2019-06-13 DIAGNOSIS — G8929 Other chronic pain: Secondary | ICD-10-CM

## 2019-06-13 DIAGNOSIS — M25551 Pain in right hip: Secondary | ICD-10-CM

## 2019-06-13 DIAGNOSIS — M25552 Pain in left hip: Secondary | ICD-10-CM

## 2019-06-13 DIAGNOSIS — M6281 Muscle weakness (generalized): Secondary | ICD-10-CM

## 2019-06-14 NOTE — Therapy (Signed)
[place], [place], [address] Phone: [phone]   Fax:  [phone]  Physical Therapy Treatment  Patient Details  Name: Leah Hampton MRN: [id number] Date of Birth: [birth year] Referring Provider (PT): Cora Daniels, MD   Encounter Date: 06/13/2019  PT End of Session - 06/13/19 1043    Visit Number  3    Number of Visits  13    Date for PT Re-Evaluation  08/01/19    Authorization Type  UHC MCR    PT Start Time  [id number]    PT Stop Time  1055    PT Time Calculation (min)  40 min    Activity Tolerance  Patient tolerated treatment well    Behavior During Therapy  [facility] for tasks assessed/performed       Past Medical History:  Diagnosis Date  . Bronchitis   . Cystitis   . Diabetes type 2, controlled ([place])   . Hematuria    urologice eval, Dr Estill Dooms  . History of chest pain   . Hyperbilirubinemia   . Hyperlipemia   . Hypertelorism   . Hypertension   . Leukocytosis   . Obesity   . Osteoarthritis    right knee  . Sleep apnea 2012   slight   . Vaginal Pap smear, abnormal   . Vitamin D deficiency 02/09/2018    Past Surgical History:  Procedure Laterality Date  . COLONOSCOPY WITH PROPOFOL N/A 03/27/2015   Procedure: COLONOSCOPY WITH PROPOFOL;  Surgeon: Milus Banister, MD;  Location: [facility];  Service: Endoscopy;  Laterality: N/A;  . DILATATION & CURETTAGE/HYSTEROSCOPY WITH MYOSURE N/A 01/05/2017   Procedure: DILATATION & CURETTAGE/HYSTEROSCOPY WITH MYOSURE;  Surgeon: Thurnell Lose, MD;  Location: [facility];  Service: Gynecology;  Laterality: N/A;  PostMenopausal Bleeding  . HYSTEROSCOPY WITH D & C N/A 05/22/2019   Procedure: DILATATION AND CURETTAGE /HYSTEROSCOPY, POLYPECTOMY WITH MYOSURE;  Surgeon: Lavonia Drafts, MD;  Location: [facility];  Service: Gynecology;  Laterality: N/A;  . KNEE ARTHROSCOPY  04/2007   left knee  . TOTAL KNEE ARTHROPLASTY  10/2008   Dr Roxine Caddy    There were no  vitals filed for this visit.  Subjective Assessment - 06/13/19 1019    Subjective  Patients pain is about a 7/10 today. She reports that the exercises causes more pain. She reports the exercises that are for her hip are hurting her knee and her hip. Her HEP is basic low level exercises at this time.    Pertinent History  h/o L TKA    How long can you sit comfortably?  unlimited    How long can you stand comfortably?  5 minutes    How long can you walk comfortably?  10 minutes    Diagnostic tests  x-ray last week    Patient Stated Goals  Lose weight, get rid of my pain    Currently in Pain?  Yes    Pain Score  7     Pain Location  Knee    Pain Orientation  Right    Pain Descriptors / Indicators  Aching    Pain Type  Chronic pain    Pain Onset  More than a month ago    Pain Frequency  Constant    Aggravating Factors   standing, walkingf    Pain Relieving Factors  ice helps    Effect of Pain on Daily Activities  difficulty perfroming ADL's  [place] Adult PT Treatment/Exercise - 06/14/19 0001      Knee/Hip Exercises: Aerobic   Nustep  L2 x5 min with UE/LE      Knee/Hip Exercises: Supine   Quad Sets  3 sets;10 reps   3 seconds   Short Arc Quad Sets  2 sets;10 reps    Hip Adduction Isometric  2 sets;10 reps   5 sec hold   Hip Adduction Isometric Limitations  hooklying with ball between knees    Bridges  2 sets;15 reps    Bridges Limitations  Legs on bolster, patient unable to clear hips from table so more of a posterior pelvic tilt      Manual Therapy   Manual therapy comments  patellar mobilization with education for self patella mobilization              PT Education - 06/13/19 1042    Education Details  reviewed the improtance of ther-ex    Person(s) Educated  Patient    Methods  Explanation;Demonstration;Tactile cues;Verbal cues    Comprehension  Returned demonstration;Verbal cues required;Verbalized understanding;Tactile cues  required          PT Long Term Goals - 06/14/19 1159      PT LONG TERM GOAL #1   Title  Pt will be able to perform 5 time sit to stand in </= 15 seconds using no UE support.    Baseline  21 seconds, using UE support    Time  6    Period  Weeks    Status  On-going      PT LONG TERM GOAL #2   Title  Pt will improve her R knee flexion to >/= 90 degrees in order to improve functional mobiltiy and gait.    Baseline  0-65 degrees    Time  6    Period  Weeks    Status  On-going      PT LONG TERM GOAL #3   Title  Pt will improve her bilateral LE strength to >/= 4/5 in order to improve gait and functional mobility.    Baseline  R knee 3+/5, R and L hip grossly 4-/5 (see flowsheets)    Time  6    Period  Weeks    Status  On-going      PT LONG TERM GOAL #4   Title  Pt will improve her FOTO score from 56% limitation to </= 43 % limitation.            Plan - 06/13/19 1043    Clinical Impression Statement  Patient is heasitant to perfrom even basic ther-ex. She reports invcreased pain in her hips with quad sets. Therapy advised her that she needs to work on being able to complete basic ther-ex in orer to improve her strength. Her right hip was hurting today. She also had patellar tightness on the right side. Therapy educated the patient on the reason for patella mobilization and how to do it at home. She was able to demonstrate. She was also given standing weight sifting forward. She toes out more on the right side. She was able to correct with cuing. She will be adavanced as tolerated.    Comorbidities  OA, HTN, obesity, h/o chest pains, DM2, bronchitis, leukocytosis, L knee arthroscopy, L knee arthroplasty 2010    Examination-Activity Limitations  Bathing;Carry;Squat;Stairs;Stand;Transfers    Stability/Clinical Decision Making  Evolving/Moderate complexity    Clinical Decision Making  Moderate    Rehab Potential  Good  PT Frequency  2x / week    PT Treatment/Interventions   ADLs/Self Care Home Management;Cryotherapy;Electrical Stimulation;Traction;Neuromuscular re-education;Balance training;Therapeutic exercise;Therapeutic activities;Gait training;Stair training;Functional mobility training;Ultrasound;Patient/family education;Manual techniques;Passive range of motion;Dry needling;Taping    PT Next Visit Plan  knee ROM, knee strengthening, Hamstring stretching, gait training, balance, Hip stretching/strengthening    PT Home Exercise Plan  [id number]: supine clamshell with red (progressing to green), side lying hip abduction, SAQ, seated marching, seated knee flexion/extension AROM    Consulted and Agree with Plan of Care  Patient       Patient will benefit from skilled therapeutic intervention in order to improve the following deficits and impairments:  Pain, Decreased activity tolerance, Decreased strength, Obesity, Decreased range of motion, Increased edema, Difficulty walking, Abnormal gait  Visit Diagnosis: Chronic pain of right knee  Pain in left hip  Pain in right hip  Muscle weakness (generalized)     Problem List Patient Active Problem List   Diagnosis Date Noted  . Right hip pain 05/28/2019  . Post-menopausal bleeding 05/16/2019  . Greater trochanteric pain syndrome of left lower extremity 03/20/2019  . Diabetes type 2, controlled ([place]) 05/18/2018  . Vitamin D deficiency 02/09/2018  . Onychomycosis 09/19/2017  . Tinea pedis of both feet 09/19/2017  . Obstructive chronic bronchitis with exacerbation ([place]) 05/23/2017  . Elevated hemoglobin A1c 06/22/2016  . Genital herpes 05/23/2012  . Stress incontinence 03/04/2012  . Osteoarthritis of right knee 10/12/2011  . CTS (carpal tunnel syndrome) 07/13/2011  . OSA (obstructive sleep apnea) 12/10/2010  . Menorrhagia 10/07/2010  . BACK PAIN, LUMBAR 08/01/2009  . Morbid obesity, unspecified obesity type ([place]) 07/31/2008  . Hyperlipidemia 07/03/2008  . Essential hypertension 07/03/2008  .  HYPERBILIRUBINEMIA 07/03/2008    Carney Living PT DPT  06/14/2019, 12:57 PM  [facility] [address] [place], [place], [address] Phone: [phone]   Fax:  [phone]  Name: Leah Hampton MRN: [id number] Date of Birth: [birth year]

## 2019-06-15 ENCOUNTER — Encounter: Payer: Self-pay | Admitting: Physical Therapy

## 2019-06-15 ENCOUNTER — Ambulatory Visit: Payer: Medicare Other | Admitting: Physical Therapy

## 2019-06-15 ENCOUNTER — Other Ambulatory Visit: Payer: Self-pay

## 2019-06-15 DIAGNOSIS — G8929 Other chronic pain: Secondary | ICD-10-CM

## 2019-06-15 DIAGNOSIS — M25551 Pain in right hip: Secondary | ICD-10-CM

## 2019-06-15 DIAGNOSIS — M6281 Muscle weakness (generalized): Secondary | ICD-10-CM

## 2019-06-15 DIAGNOSIS — M25561 Pain in right knee: Secondary | ICD-10-CM | POA: Diagnosis not present

## 2019-06-15 DIAGNOSIS — M25552 Pain in left hip: Secondary | ICD-10-CM

## 2019-06-15 NOTE — Therapy (Signed)
[place], [place], [address] Phone: [phone]   Fax:  [phone]  Physical Therapy Treatment  Patient Details  Name: Leah Hampton MRN: [id number] Date of Birth: July 05, 1955 Referring Provider (PT): Cora Daniels, MD   Encounter Date: 06/15/2019  PT End of Session - 06/15/19 0942    Visit Number  4    Number of Visits  13    Date for PT Re-Evaluation  08/01/19    Authorization Type  UHC MCR    PT Start Time  0945    PT Stop Time  1025    PT Time Calculation (min)  40 min    Activity Tolerance  Patient tolerated treatment well    Behavior During Therapy  [facility] for tasks assessed/performed       Past Medical History:  Diagnosis Date  . Bronchitis   . Cystitis   . Diabetes type 2, controlled ([place])   . Hematuria    urologice eval, Dr Estill Dooms  . History of chest pain   . Hyperbilirubinemia   . Hyperlipemia   . Hypertelorism   . Hypertension   . Leukocytosis   . Obesity   . Osteoarthritis    right knee  . Sleep apnea 2012   slight   . Vaginal Pap smear, abnormal   . Vitamin D deficiency 02/09/2018    Past Surgical History:  Procedure Laterality Date  . COLONOSCOPY WITH PROPOFOL N/A 03/27/2015   Procedure: COLONOSCOPY WITH PROPOFOL;  Surgeon: Milus Banister, MD;  [facility];  Service: Endoscopy;  Laterality: N/A;  . DILATATION & CURETTAGE/HYSTEROSCOPY WITH MYOSURE N/A 01/05/2017   Procedure: DILATATION & CURETTAGE/HYSTEROSCOPY WITH MYOSURE;  Surgeon: Thurnell Lose, MD;  Location: [facility];  Service: Gynecology;  Laterality: N/A;  PostMenopausal Bleeding  . HYSTEROSCOPY WITH D & C N/A 05/22/2019   Procedure: DILATATION AND CURETTAGE /HYSTEROSCOPY, POLYPECTOMY WITH MYOSURE;  Surgeon: Lavonia Drafts, MD;  Location: [facility];  Service: Gynecology;  Laterality: N/A;  . KNEE ARTHROSCOPY  04/2007   left knee  . TOTAL KNEE ARTHROPLASTY  10/2008   Dr Roxine Caddy    There were no  vitals filed for this visit.  Subjective Assessment - 06/15/19 0950    Subjective  Patient reports she is feeling a little better today. She has a brace that she is going to take into the doctors office on Tuesday (06/19/2019) to be instructed on how to use it. She has been consistent with her exercises and feels they are starting to help some.    Currently in Pain?  Yes    Pain Score  6     Pain Location  Knee    Pain Orientation  Right    Pain Descriptors / Indicators  Aching    Pain Type  Chronic pain    Pain Onset  More than a month ago    Pain Frequency  Constant    Pain Score  1    Pain Location  Hip    Pain Orientation  Left;Right    Pain Descriptors / Indicators  Aching    Pain Type  Chronic pain    Pain Onset  More than a month ago    Pain Frequency  Intermittent                       [facility] Adult PT Treatment/Exercise - 06/15/19 0001      Exercises   Exercises  Knee/Hip  Knee/Hip Exercises: Aerobic   Nustep  L4 x5 min with UE/LE      Knee/Hip Exercises: Standing   Heel Raises  2 sets;10 reps    Hip Abduction  2 sets;10 reps      Knee/Hip Exercises: Supine   Quad Sets  2 sets;10 reps   3 sec hold   Short Arc Quad Sets  2 sets;10 reps    Short Arc Quad Sets Limitations  Legs on bolster    Hip Adduction Isometric  2 sets;5 sets   3 sec hold   Hip Adduction Isometric Limitations  Ball between knees, limited knee flexion on right    Bridges  2 sets;10 reps    Bridges Limitations  Legs on bolster, patient unable to clear hips from table so more of a posterior pelvic tilt    Other Supine Knee/Hip Exercises  Clamshell with green band 2x10   limited knee flexion on right     Manual Therapy   Manual Therapy  Joint mobilization    Joint Mobilization  Right patellar mobs all directions, tibiofemoral mobs PA/AP              PT Education - 06/15/19 0941    Education Details  HEP    Person(s) Educated  Patient    Methods   Explanation;Demonstration;Verbal cues    Comprehension  Verbalized understanding;Returned demonstration;Verbal cues required;Need further instruction          PT Long Term Goals - 06/14/19 1159      PT LONG TERM GOAL #1   Title  Pt will be able to perform 5 time sit to stand in </= 15 seconds using no UE support.    Baseline  21 seconds, using UE support    Time  6    Period  Weeks    Status  On-going      PT LONG TERM GOAL #2   Title  Pt will improve her R knee flexion to >/= 90 degrees in order to improve functional mobiltiy and gait.    Baseline  0-65 degrees    Time  6    Period  Weeks    Status  On-going      PT LONG TERM GOAL #3   Title  Pt will improve her bilateral LE strength to >/= 4/5 in order to improve gait and functional mobility.    Baseline  R knee 3+/5, R and L hip grossly 4-/5 (see flowsheets)    Time  6    Period  Weeks    Status  On-going      PT LONG TERM GOAL #4   Title  Pt will improve her FOTO score from 56% limitation to </= 43 % limitation.            Plan - 06/15/19 0942    Clinical Impression Statement  Patient did well with her exercise this visit and did report improvement in her symptoms. She continues to exhibit limited knee flexion and extension with limited mobility of the knee and patella. She did tolerate some standing exercises this visit. She was cued for knee flexion with gait as she has tendency to walk with right knee straight and circumduct the leg. She would benefit from continued skilled PT to progress knee motion and strength to improve walking ability and reduce pain.    PT Treatment/Interventions  ADLs/Self Care Home Management;Cryotherapy;Electrical Stimulation;Traction;Neuromuscular re-education;Balance training;Therapeutic exercise;Therapeutic activities;Gait training;Stair training;Functional mobility training;Ultrasound;Patient/family education;Manual techniques;Passive range of motion;Dry needling;Taping  PT Next Visit  Plan  knee ROM, knee strengthening, Hamstring stretching, gait training, balance, Hip stretching/strengthening    PT Home Exercise Plan  [id number]: supine clamshell with red (progressing to green), side lying hip abduction, SAQ, seated marching, seated knee flexion/extension AROM    Consulted and Agree with Plan of Care  Patient       Patient will benefit from skilled therapeutic intervention in order to improve the following deficits and impairments:  Pain, Decreased activity tolerance, Decreased strength, Obesity, Decreased range of motion, Increased edema, Difficulty walking, Abnormal gait  Visit Diagnosis: Chronic pain of right knee  Pain in left hip  Pain in right hip  Muscle weakness (generalized)     Problem List Patient Active Problem List   Diagnosis Date Noted  . Right hip pain 05/28/2019  . Post-menopausal bleeding 05/16/2019  . Greater trochanteric pain syndrome of left lower extremity 03/20/2019  . Diabetes type 2, controlled ([place]) 05/18/2018  . Vitamin D deficiency 02/09/2018  . Onychomycosis 09/19/2017  . Tinea pedis of both feet 09/19/2017  . Obstructive chronic bronchitis with exacerbation ([place]) 05/23/2017  . Elevated hemoglobin A1c 06/22/2016  . Genital herpes 05/23/2012  . Stress incontinence 03/04/2012  . Osteoarthritis of right knee 10/12/2011  . CTS (carpal tunnel syndrome) 07/13/2011  . OSA (obstructive sleep apnea) 12/10/2010  . Menorrhagia 10/07/2010  . BACK PAIN, LUMBAR 08/01/2009  . Morbid obesity, unspecified obesity type ([place]) 07/31/2008  . Hyperlipidemia 07/03/2008  . Essential hypertension 07/03/2008  . HYPERBILIRUBINEMIA 07/03/2008    Hilda Blades, PT, DPT, LAT, ATC 06/15/19  10:29 AM Phone: [phone] Fax: [place] [facility] [address] [place], [place], [address] Phone: [phone]   Fax:  [phone]  Name: HABIBAH RYEN MRN: [id number] Date of Birth:  July 11, 1955

## 2019-06-18 ENCOUNTER — Other Ambulatory Visit: Payer: Self-pay

## 2019-06-18 ENCOUNTER — Encounter: Payer: Self-pay | Admitting: Physical Therapy

## 2019-06-18 ENCOUNTER — Ambulatory Visit: Payer: Medicare Other | Attending: Family Medicine | Admitting: Physical Therapy

## 2019-06-18 DIAGNOSIS — M6281 Muscle weakness (generalized): Secondary | ICD-10-CM | POA: Diagnosis not present

## 2019-06-18 DIAGNOSIS — M25552 Pain in left hip: Secondary | ICD-10-CM | POA: Insufficient documentation

## 2019-06-18 DIAGNOSIS — G8929 Other chronic pain: Secondary | ICD-10-CM | POA: Diagnosis not present

## 2019-06-18 DIAGNOSIS — M25561 Pain in right knee: Secondary | ICD-10-CM | POA: Diagnosis not present

## 2019-06-18 DIAGNOSIS — M25551 Pain in right hip: Secondary | ICD-10-CM | POA: Insufficient documentation

## 2019-06-18 NOTE — Therapy (Signed)
[place], [place], [address] Phone: [phone]   Fax:  [phone]  Physical Therapy Treatment  Patient Details  Name: Leah Hampton MRN: [id number] Date of Birth: Apr 11, 1956 Referring Provider (PT): Cora Daniels, MD   Encounter Date: 06/18/2019  PT End of Session - 06/18/19 1018    Visit Number  5    Number of Visits  13    Date for PT Re-Evaluation  08/01/19    Authorization Type  UHC MCR    PT Start Time  1001    PT Stop Time  1045    PT Time Calculation (min)  44 min    Equipment Utilized During Treatment  Gait belt    Activity Tolerance  Patient tolerated treatment well    Behavior During Therapy  [facility] for tasks assessed/performed       Past Medical History:  Diagnosis Date  . Bronchitis   . Cystitis   . Diabetes type 2, controlled ([place])   . Hematuria    urologice eval, Dr Estill Dooms  . History of chest pain   . Hyperbilirubinemia   . Hyperlipemia   . Hypertelorism   . Hypertension   . Leukocytosis   . Obesity   . Osteoarthritis    right knee  . Sleep apnea 2012   slight   . Vaginal Pap smear, abnormal   . Vitamin D deficiency 02/09/2018    Past Surgical History:  Procedure Laterality Date  . COLONOSCOPY WITH PROPOFOL N/A 03/27/2015   Procedure: COLONOSCOPY WITH PROPOFOL;  Surgeon: Milus Banister, MD;  [facility];  Service: Endoscopy;  Laterality: N/A;  . DILATATION & CURETTAGE/HYSTEROSCOPY WITH MYOSURE N/A 01/05/2017   Procedure: DILATATION & CURETTAGE/HYSTEROSCOPY WITH MYOSURE;  Surgeon: Thurnell Lose, MD;  Location: [facility];  Service: Gynecology;  Laterality: N/A;  PostMenopausal Bleeding  . HYSTEROSCOPY WITH D & C N/A 05/22/2019   Procedure: DILATATION AND CURETTAGE /HYSTEROSCOPY, POLYPECTOMY WITH MYOSURE;  Surgeon: Lavonia Drafts, MD;  [facility]: [facility];  Service: Gynecology;  Laterality: N/A;  . KNEE ARTHROSCOPY  04/2007   left knee  . TOTAL KNEE  ARTHROPLASTY  10/2008   Dr Roxine Caddy    There were no vitals filed for this visit.  Subjective Assessment - 06/18/19 1015    Subjective  Pt arriving to therapy reporting 5/10 pain. Pt feels like she has improved since beginning therpay and after doing her stretches and execises at home. Pt scheduled to get a knee brace tomorrow at [facility] 06/19/2019.    Pertinent History  h/o L TKA    How long can you sit comfortably?  unlimited    Diagnostic tests  x-ray last week    Patient Stated Goals  Lose weight, get rid of my pain    Currently in Pain?  Yes    Pain Score  5     Pain Location  Knee    Pain Orientation  Right    Pain Descriptors / Indicators  Aching    Pain Type  Chronic pain    Pain Onset  More than a month ago    Pain Score  5    Pain Location  Hip    Pain Orientation  Left    Pain Descriptors / Indicators  Aching    Pain Type  Chronic pain    Pain Onset  More than a month ago         [facility] PT Assessment - 06/18/19 0001  AROM   Overall AROM   Deficits    AROM Assessment Site  Knee    Right/Left Knee  Right    Right Knee Extension  0    Right Knee Flexion  75                   [facility] Adult PT Treatment/Exercise - 06/18/19 0001      Transfers   Five time sit to stand comments   14 seconds with UE support from mat table.       Exercises   Exercises  Knee/Hip      Knee/Hip Exercises: Aerobic   Nustep  L4 x5 min with UE/LE      Knee/Hip Exercises: Standing   Heel Raises  2 sets;10 reps    Hip Flexion  Stengthening;Both;2 sets;10 reps    Hip Abduction  2 sets;10 reps      Knee/Hip Exercises: Supine   Quad Sets  2 sets;10 reps   3 sec hold   Short Arc Quad Sets  2 sets;10 reps    Short Arc Quad Sets Limitations  bolster    Hip Adduction Isometric  2 sets;5 sets   3 sec hold   Hip Adduction Isometric Limitations  Ball between knees, limited knee flexion on right    Bridges  2 sets;10 reps    Other Supine Knee/Hip Exercises  Clamshell with green band  2x10   limited knee flexion on right     Manual Therapy   Manual Therapy  Joint mobilization;Passive ROM    Joint Mobilization  Right patellar mobs all directions, tibiofemoral mobs PA/AP     Passive ROM  R knee flexion/extension                  PT Long Term Goals - 06/18/19 1024      PT LONG TERM GOAL #1   Title  Pt will be able to perform 5 time sit to stand in </= 15 seconds using no UE support.    Baseline  21 seconds, using UE support    Period  Weeks    Status  On-going      PT LONG TERM GOAL #2   Title  Pt will improve her R knee flexion to >/= 90 degrees in order to improve functional mobiltiy and gait.    Baseline  0-75    Time  6    Period  Weeks    Status  On-going      PT LONG TERM GOAL #3   Title  Pt will improve her bilateral LE strength to >/= 4/5 in order to improve gait and functional mobility.    Baseline  R knee 3+/5, R and L hip grossly 4-/5 (see flowsheets)    Time  6    Period  Weeks    Status  On-going      PT LONG TERM GOAL #4   Title  Pt will improve her FOTO score from 56% limitation to </= 43 % limitation.    Baseline  56% limitation    Time  6    Period  Weeks    Status  New            Plan - 06/18/19 1019    Clinical Impression Statement  Pt tolerating exercises well. Pt improving with decreased pain and abilty to function at home. Still reporting 5/10 pain in R knee and Left hip. Pt progressing with increased knee flexion and extension during  gait with instructions required to prevent circumduction of R LE. Pt amb with no device. Pt with increased difficulty with SLR on the R LE due to decreased strength. Continue with skilled PT to progress toward goals set.    Personal Factors and Comorbidities  Comorbidity 3+    Examination-Activity Limitations  Bathing;Carry;Squat;Stairs;Stand;Transfers    Examination-Participation Restrictions  Community Activity;Other    Stability/Clinical Decision Making  Evolving/Moderate complexity     Rehab Potential  Good    PT Frequency  2x / week    PT Duration  6 weeks    PT Treatment/Interventions  ADLs/Self Care Home Management;Cryotherapy;Electrical Stimulation;Traction;Neuromuscular re-education;Balance training;Therapeutic exercise;Therapeutic activities;Gait training;Stair training;Functional mobility training;Ultrasound;Patient/family education;Manual techniques;Passive range of motion;Dry needling;Taping    PT Home Exercise Plan  [id number]: supine clamshell with red (progressing to green), side lying hip abduction, SAQ, seated marching, seated knee flexion/extension AROM    Consulted and Agree with Plan of Care  Patient       Patient will benefit from skilled therapeutic intervention in order to improve the following deficits and impairments:  Pain, Decreased activity tolerance, Decreased strength, Obesity, Decreased range of motion, Increased edema, Difficulty walking, Abnormal gait  Visit Diagnosis: Chronic pain of right knee  Pain in left hip  Pain in right hip  Muscle weakness (generalized)     Problem List Patient Active Problem List   Diagnosis Date Noted  . Right hip pain 05/28/2019  . Post-menopausal bleeding 05/16/2019  . Greater trochanteric pain syndrome of left lower extremity 03/20/2019  . Diabetes type 2, controlled ([place]) 05/18/2018  . Vitamin D deficiency 02/09/2018  . Onychomycosis 09/19/2017  . Tinea pedis of both feet 09/19/2017  . Obstructive chronic bronchitis with exacerbation ([place]) 05/23/2017  . Elevated hemoglobin A1c 06/22/2016  . Genital herpes 05/23/2012  . Stress incontinence 03/04/2012  . Osteoarthritis of right knee 10/12/2011  . CTS (carpal tunnel syndrome) 07/13/2011  . OSA (obstructive sleep apnea) 12/10/2010  . Menorrhagia 10/07/2010  . BACK PAIN, LUMBAR 08/01/2009  . Morbid obesity, unspecified obesity type ([place]) 07/31/2008  . Hyperlipidemia 07/03/2008  . Essential hypertension 07/03/2008  . HYPERBILIRUBINEMIA 07/03/2008     Oretha Caprice, PT 06/18/2019, 10:37 AM  [facility] [address] [place], [place], [address] Phone: [phone]   Fax:  [phone]  Name: Leah Hampton MRN: [id number] Date of Birth: April 21, 1956

## 2019-06-20 ENCOUNTER — Other Ambulatory Visit: Payer: Self-pay

## 2019-06-20 ENCOUNTER — Ambulatory Visit: Payer: Medicare Other | Admitting: Physical Therapy

## 2019-06-20 ENCOUNTER — Encounter: Payer: Self-pay | Admitting: Physical Therapy

## 2019-06-20 DIAGNOSIS — M6281 Muscle weakness (generalized): Secondary | ICD-10-CM

## 2019-06-20 DIAGNOSIS — M25561 Pain in right knee: Secondary | ICD-10-CM | POA: Diagnosis not present

## 2019-06-20 DIAGNOSIS — M25551 Pain in right hip: Secondary | ICD-10-CM

## 2019-06-20 DIAGNOSIS — M25552 Pain in left hip: Secondary | ICD-10-CM

## 2019-06-20 DIAGNOSIS — G8929 Other chronic pain: Secondary | ICD-10-CM

## 2019-06-20 NOTE — Therapy (Signed)
[place], [place], [address] Phone: [phone]   Fax:  [phone]  Physical Therapy Treatment  Patient Details  Name: Leah Hampton MRN: [id number] Date of Birth: January 01, 1956 Referring Provider (PT): Cora Daniels, MD   Encounter Date: 06/20/2019  PT End of Session - 06/20/19 1015    Visit Number  6    Number of Visits  13    Authorization Type  UHC MCR    PT Start Time  [id number]    PT Stop Time  1045    PT Time Calculation (min)  43 min    Equipment Utilized During Treatment  Gait belt    Activity Tolerance  Patient tolerated treatment well    Behavior During Therapy  WFL for tasks assessed/performed       Past Medical History:  Diagnosis Date  . Bronchitis   . Cystitis   . Diabetes type 2, controlled ([place])   . Hematuria    urologice eval, Dr Estill Dooms  . History of chest pain   . Hyperbilirubinemia   . Hyperlipemia   . Hypertelorism   . Hypertension   . Leukocytosis   . Obesity   . Osteoarthritis    right knee  . Sleep apnea 2012   slight   . Vaginal Pap smear, abnormal   . Vitamin D deficiency 02/09/2018    Past Surgical History:  Procedure Laterality Date  . COLONOSCOPY WITH PROPOFOL N/A 03/27/2015   Procedure: COLONOSCOPY WITH PROPOFOL;  Surgeon: Milus Banister, MD;  Location: [facility];  Service: Endoscopy;  Laterality: N/A;  . DILATATION & CURETTAGE/HYSTEROSCOPY WITH MYOSURE N/A 01/05/2017   Procedure: DILATATION & CURETTAGE/HYSTEROSCOPY WITH MYOSURE;  Surgeon: Thurnell Lose, MD;  Location: [facility];  Service: Gynecology;  Laterality: N/A;  PostMenopausal Bleeding  . HYSTEROSCOPY WITH D & C N/A 05/22/2019   Procedure: DILATATION AND CURETTAGE /HYSTEROSCOPY, POLYPECTOMY WITH MYOSURE;  Surgeon: Lavonia Drafts, MD;  Location: [facility];  Service: Gynecology;  Laterality: N/A;  . KNEE ARTHROSCOPY  04/2007   left knee  . TOTAL KNEE ARTHROPLASTY  10/2008   Dr Roxine Caddy    There  were no vitals filed for this visit.  Subjective Assessment - 06/20/19 1010    Subjective  Pt arriving to therpay reporting 5/10 pain.    Pertinent History  h/o L TKA    How long can you sit comfortably?  unlimited    How long can you stand comfortably?  5 minutes    How long can you walk comfortably?  10 minutes    Diagnostic tests  x-ray last week    Patient Stated Goals  Lose weight, get rid of my pain    Currently in Pain?  Yes    Pain Score  5     Pain Location  Knee    Pain Orientation  Right    Pain Descriptors / Indicators  Aching    Pain Radiating Towards  groin area    Pain Onset  More than a month ago                       [facility] Adult PT Treatment/Exercise - 06/20/19 0001      Exercises   Exercises  Knee/Hip      Knee/Hip Exercises: Aerobic   Nustep  L4 x5 min with UE/LE      Knee/Hip Exercises: Standing   Heel Raises  2 sets;10 reps    Hip Flexion  Stengthening;Both;2 sets;10 reps    Hip Abduction  2 sets;10 reps    Other Standing Knee Exercises  hamstring curls x 10, UE support      Knee/Hip Exercises: Seated   Long Arc Quad  Strengthening;Both;10 reps    Ball Squeeze  15x holding 5 seconds    Marching  AROM;Strengthening;10 reps    Sit to [employer]  5 reps;without UE support      Knee/Hip Exercises: Supine   Quad Sets  2 sets;10 reps   3 sec hold   Short Arc Quad Sets  2 sets;10 reps    Short Arc Quad Sets Limitations  bolster    Bridges  2 sets;10 reps    Bridges Limitations  pelvic tilts      Knee/Hip Exercises: Prone   Other [place]one Exercises  knee bends with therapist assist holding 10 seconds each      Manual Therapy   Manual Therapy  Joint mobilization;Passive ROM    Joint Mobilization  Right patellar mobs all directions, tibiofemoral mobs PA/AP     Passive ROM  R knee flexion/extension             PT Education - 06/20/19 1013    Education Details  HEP, exercise technique throughout session, postural correction    Person(s)  Educated  Patient    Methods  Explanation;Demonstration    Comprehension  Verbalized understanding;Returned demonstration          PT Long Term Goals - 06/20/19 1024      PT LONG TERM GOAL #1   Title  Pt will be able to perform 5 time sit to stand in </= 15 seconds using no UE support.    Baseline  Pt able to stand from mat table with no UE support    Time  6    Period  Weeks    Status  On-going      PT LONG TERM GOAL #2   Title  Pt will improve her R knee flexion to >/= 90 degrees in order to improve functional mobiltiy and gait.    Baseline  0-75    Time  6    Period  Weeks    Status  On-going      PT LONG TERM GOAL #3   Title  Pt will improve her bilateral LE strength to >/= 4/5 in order to improve gait and functional mobility.    Baseline  R knee 3+/5, R and L hip grossly 4-/5 (see flowsheets)    Period  Weeks    Status  On-going      PT LONG TERM GOAL #4   Title  Pt will improve her FOTO score from 56% limitation to </= 43 % limitation.    Baseline  56% limitation    Period  Weeks    Status  New            Plan - 06/20/19 1016    Clinical Impression Statement  Pt tolerating exericses well. Pt needed instructions for exercise technique. Pt requiring rest breaks during session for fatigue. Pt still having difficulty with straight leg raises. Pt progressing with standing exreicses and functional mobility with sit to stand. Continue skilled PT.    Personal Factors and Comorbidities  Comorbidity 3+    Comorbidities  OA, HTN, obesity, h/o chest pains, DM2, bronchitis, leukocytosis, L knee arthroscopy, L knee arthroplasty 2010    Examination-Activity Limitations  Bathing;Carry;Squat;Stairs;Stand;Transfers    Examination-Participation Restrictions  Community Activity;Other  Stability/Clinical Decision Making  Evolving/Moderate complexity    Rehab Potential  Good    PT Frequency  2x / week    PT Duration  6 weeks    PT Treatment/Interventions  ADLs/Self Care Home  Management;Cryotherapy;Electrical Stimulation;Traction;Neuromuscular re-education;Balance training;Therapeutic exercise;Therapeutic activities;Gait training;Stair training;Functional mobility training;Ultrasound;Patient/family education;Manual techniques;Passive range of motion;Dry needling;Taping    PT Next Visit Plan  knee ROM, knee strengthening, Hamstring stretching, gait training, balance, Hip stretching/strengthening    PT Home Exercise Plan  [id number]: supine clamshell with red (progressing to green), side lying hip abduction, SAQ, seated marching, seated knee flexion/extension AROM    Consulted and Agree with Plan of Care  Patient       Patient will benefit from skilled therapeutic intervention in order to improve the following deficits and impairments:  Pain, Decreased activity tolerance, Decreased strength, Obesity, Decreased range of motion, Increased edema, Difficulty walking, Abnormal gait  Visit Diagnosis: Chronic pain of right knee  Pain in left hip  Pain in right hip  Muscle weakness (generalized)     Problem List Patient Active Problem List   Diagnosis Date Noted  . Right hip pain 05/28/2019  . Post-menopausal bleeding 05/16/2019  . Greater trochanteric pain syndrome of left lower extremity 03/20/2019  . Diabetes type 2, controlled ([place]) 05/18/2018  . Vitamin D deficiency 02/09/2018  . Onychomycosis 09/19/2017  . Tinea pedis of both feet 09/19/2017  . Obstructive chronic bronchitis with exacerbation ([place]) 05/23/2017  . Elevated hemoglobin A1c 06/22/2016  . Genital herpes 05/23/2012  . Stress incontinence 03/04/2012  . Osteoarthritis of right knee 10/12/2011  . CTS (carpal tunnel syndrome) 07/13/2011  . OSA (obstructive sleep apnea) 12/10/2010  . Menorrhagia 10/07/2010  . BACK PAIN, LUMBAR 08/01/2009  . Morbid obesity, unspecified obesity type ([place]) 07/31/2008  . Hyperlipidemia 07/03/2008  . Essential hypertension 07/03/2008  . HYPERBILIRUBINEMIA 07/03/2008     Oretha Caprice, PT 06/20/2019, 10:47 AM  [facility] [address] [place], [place], [address] Phone: [phone]   Fax:  [phone]  Name: BRIANI MARICONDA MRN: [id number] Date of Birth: 1955-10-04

## 2019-06-25 ENCOUNTER — Encounter: Payer: Self-pay | Admitting: Physical Therapy

## 2019-06-25 ENCOUNTER — Other Ambulatory Visit: Payer: Self-pay

## 2019-06-25 ENCOUNTER — Ambulatory Visit: Payer: Medicare Other | Admitting: Physical Therapy

## 2019-06-25 DIAGNOSIS — M25561 Pain in right knee: Secondary | ICD-10-CM | POA: Diagnosis not present

## 2019-06-25 DIAGNOSIS — M6281 Muscle weakness (generalized): Secondary | ICD-10-CM

## 2019-06-25 DIAGNOSIS — M25552 Pain in left hip: Secondary | ICD-10-CM

## 2019-06-25 DIAGNOSIS — G8929 Other chronic pain: Secondary | ICD-10-CM | POA: Diagnosis not present

## 2019-06-25 DIAGNOSIS — M25551 Pain in right hip: Secondary | ICD-10-CM | POA: Diagnosis not present

## 2019-06-25 NOTE — Therapy (Signed)
[place], [place], [address] Phone: [phone]   Fax:  [phone]  Physical Therapy Treatment  Patient Details  Name: Leah Hampton MRN: [id number] Date of Birth: 1956-01-04 Referring Provider (PT): Cora Daniels, MD   Encounter Date: 06/25/2019  PT End of Session - 06/25/19 1017    Visit Number  7    Number of Visits  13    Date for PT Re-Evaluation  08/01/19    Authorization Type  UHC MCR    PT Start Time  1000    PT Stop Time  1043    PT Time Calculation (min)  43 min    Equipment Utilized During Treatment  Gait belt    Activity Tolerance  Patient tolerated treatment well    Behavior During Therapy  [facility] for tasks assessed/performed       Past Medical History:  Diagnosis Date  . Bronchitis   . Cystitis   . Diabetes type 2, controlled ([place])   . Hematuria    urologice eval, Dr Estill Dooms  . History of chest pain   . Hyperbilirubinemia   . Hyperlipemia   . Hypertelorism   . Hypertension   . Leukocytosis   . Obesity   . Osteoarthritis    right knee  . Sleep apnea 2012   slight   . Vaginal Pap smear, abnormal   . Vitamin D deficiency 02/09/2018    Past Surgical History:  Procedure Laterality Date  . COLONOSCOPY WITH PROPOFOL N/A 03/27/2015   Procedure: COLONOSCOPY WITH PROPOFOL;  Surgeon: Milus Banister, MD;  Location: [facility];  Service: Endoscopy;  Laterality: N/A;  . DILATATION & CURETTAGE/HYSTEROSCOPY WITH MYOSURE N/A 01/05/2017   Procedure: DILATATION & CURETTAGE/HYSTEROSCOPY WITH MYOSURE;  Surgeon: Thurnell Lose, MD;  Location: [facility];  Service: Gynecology;  Laterality: N/A;  PostMenopausal Bleeding  . HYSTEROSCOPY WITH D & C N/A 05/22/2019   Procedure: DILATATION AND CURETTAGE /HYSTEROSCOPY, POLYPECTOMY WITH MYOSURE;  Surgeon: Lavonia Drafts, MD;  Location: [facility];  Service: Gynecology;  Laterality: N/A;  . KNEE ARTHROSCOPY  04/2007   left knee  . TOTAL KNEE  ARTHROPLASTY  10/2008   Dr Roxine Caddy    There were no vitals filed for this visit.  Subjective Assessment - 06/25/19 1015    Subjective  Pt arrivng to therapy reporting 5/10 pain today, Pt reporting increased pain over the weekend. Pt reporting she had to take an ibuprofen.    How long can you sit comfortably?  unlimited    How long can you stand comfortably?  5 minutes    How long can you walk comfortably?  10 minutes    Diagnostic tests  x-ray last week    Patient Stated Goals  Lose weight, get rid of my pain    Currently in Pain?  Yes    Pain Score  5     Pain Location  Knee    Pain Orientation  Right    Pain Descriptors / Indicators  Aching    Pain Type  Chronic pain    Pain Onset  More than a month ago    Pain Onset  More than a month ago                       [facility] Adult PT Treatment/Exercise - 06/25/19 0001      Exercises   Exercises  Knee/Hip      Knee/Hip Exercises: Aerobic   Nustep  L4  x5 min with UE/LE      Knee/Hip Exercises: Standing   Heel Raises  2 sets;10 reps    Hip Flexion  Stengthening;Both;2 sets;10 reps    Hip Abduction  2 sets;10 reps    Other Standing Knee Exercises  hamstring curls x 10, UE support      Knee/Hip Exercises: Seated   Long Arc Quad  Strengthening;Both;10 reps    Ball Squeeze  15x holding 5 seconds    Marching  AROM;Strengthening;10 reps    Sit to [employer]  5 reps;without UE support      Knee/Hip Exercises: Supine   Quad Sets  2 sets;10 reps   3 sec hold   Short Arc [employer] Sets  2 sets;10 reps    Short Arc Quad Sets Limitations  ball    Bridges  AROM;2 sets;10 reps    Bridges Limitations  pelvic tilts      Manual Therapy   Manual Therapy  Joint mobilization;Passive ROM    Joint Mobilization  Right patellar mobs all directions, tibiofemoral mobs PA/AP     Passive ROM  R knee flexion/extension                  PT Long Term Goals - 06/25/19 1022      PT LONG TERM GOAL #1   Title  Pt will be able to perform  5 time sit to stand in </= 15 seconds using no UE support.    Baseline  Pt able to stand from mat table with no UE support    Time  6    Period  Weeks    Status  On-going      PT LONG TERM GOAL #2   Title  Pt will improve her R knee flexion to >/= 90 degrees in order to improve functional mobiltiy and gait.    Baseline  0-75    Time  6    Period  Weeks    Status  On-going      PT LONG TERM GOAL #3   Title  Pt will improve her bilateral LE strength to >/= 4/5 in order to improve gait and functional mobility.    Baseline  R knee 3+/5, R and L hip grossly 4-/5 (see flowsheets)    Time  6    Period  Weeks    Status  On-going      PT LONG TERM GOAL #4   Title  Pt will improve her FOTO score from 56% limitation to </= 43 % limitation.    Baseline  56% limitation    Time  6    Period  Weeks    Status  On-going            Plan - 06/25/19 1018    Clinical Impression Statement  Pt tolreating exercises well, Pt reporting painful  R knee over the weekend and feels like she may have over done it last week. Pt making progress with generalized LE strengthening, Pt still amb with antalgic gait pattern. Discussed continued skiled therapy for 6 more visits.    Personal Factors and Comorbidities  Comorbidity 3+    Comorbidities  OA, HTN, obesity, h/o chest pains, DM2, bronchitis, leukocytosis, L knee arthroscopy, L knee arthroplasty 2010    Examination-Activity Limitations  Bathing;Carry;Squat;Stairs;Stand;Transfers    Examination-Participation Restrictions  Community Activity;Other    Stability/Clinical Decision Making  Evolving/Moderate complexity    Rehab Potential  Good    PT Frequency  2x / week  PT Duration  6 weeks    PT Treatment/Interventions  ADLs/Self Care Home Management;Cryotherapy;Electrical Stimulation;Traction;Neuromuscular re-education;Balance training;Therapeutic exercise;Therapeutic activities;Gait training;Stair training;Functional mobility  training;Ultrasound;Patient/family education;Manual techniques;Passive range of motion;Dry needling;Taping    PT Next Visit Plan  knee ROM, knee strengthening, Hamstring stretching, gait training, balance, Hip stretching/strengthening    PT Home Exercise Plan  [id number]: supine clamshell with red (progressing to green), side lying hip abduction, SAQ, seated marching, seated knee flexion/extension AROM       Patient will benefit from skilled therapeutic intervention in order to improve the following deficits and impairments:  Pain, Decreased activity tolerance, Decreased strength, Obesity, Decreased range of motion, Increased edema, Difficulty walking, Abnormal gait  Visit Diagnosis: Chronic pain of right knee  Pain in left hip  Pain in right hip  Muscle weakness (generalized)     Problem List Patient Active Problem List   Diagnosis Date Noted  . Right hip pain 05/28/2019  . Post-menopausal bleeding 05/16/2019  . Greater trochanteric pain syndrome of left lower extremity 03/20/2019  . Diabetes type 2, controlled ([place]) 05/18/2018  . Vitamin D deficiency 02/09/2018  . Onychomycosis 09/19/2017  . Tinea pedis of both feet 09/19/2017  . Obstructive chronic bronchitis with exacerbation ([place]) 05/23/2017  . Elevated hemoglobin A1c 06/22/2016  . Genital herpes 05/23/2012  . Stress incontinence 03/04/2012  . Osteoarthritis of right knee 10/12/2011  . CTS (carpal tunnel syndrome) 07/13/2011  . OSA (obstructive sleep apnea) 12/10/2010  . Menorrhagia 10/07/2010  . BACK PAIN, LUMBAR 08/01/2009  . Morbid obesity, unspecified obesity type ([place]) 07/31/2008  . Hyperlipidemia 07/03/2008  . Essential hypertension 07/03/2008  . HYPERBILIRUBINEMIA 07/03/2008    Oretha Caprice, PT 06/25/2019, 10:46 AM  [facility] [address] [place], [place], [address] Phone: [phone]   Fax:  [phone]  Name: SYLENA RASUL MRN:  [id number] Date of Birth: 12-20-1955

## 2019-06-27 ENCOUNTER — Other Ambulatory Visit: Payer: Self-pay

## 2019-06-27 ENCOUNTER — Encounter: Payer: Self-pay | Admitting: Physical Therapy

## 2019-06-27 ENCOUNTER — Ambulatory Visit: Payer: Medicare Other | Admitting: Physical Therapy

## 2019-06-27 DIAGNOSIS — G8929 Other chronic pain: Secondary | ICD-10-CM

## 2019-06-27 DIAGNOSIS — M6281 Muscle weakness (generalized): Secondary | ICD-10-CM | POA: Diagnosis not present

## 2019-06-27 DIAGNOSIS — M25552 Pain in left hip: Secondary | ICD-10-CM | POA: Diagnosis not present

## 2019-06-27 DIAGNOSIS — M25561 Pain in right knee: Secondary | ICD-10-CM

## 2019-06-27 DIAGNOSIS — M25551 Pain in right hip: Secondary | ICD-10-CM

## 2019-06-27 NOTE — Patient Instructions (Signed)
Access Code: [email or web address]  URL: [email or web address]  Date: 06/27/2019  Prepared by: Hilda Blades   Exercises Seated Long Arc Quad - 10 reps - 2 sets - 1-2x daily - 7x weekly Standing March with Counter Support - 10 reps - 2 sets - 1-2x daily - 7x weekly Standing Hip Abduction with Counter Support - 10 reps - 2 sets - 1-2x daily - 7x weekly Standing Knee Flexion with Counter Support - 10 reps - 2 sets - 1-2x daily - 7x weekly Standing Hip Extension with Counter Support - 10 reps - 2 sets - 1-2x daily - 7x weekly Heel rises with counter support - 10 reps - 2 sets - 1-2x daily - 7x weekly

## 2019-06-27 NOTE — Therapy (Signed)
[place], [place], [address] Phone: [phone]   Fax:  [phone]  Physical Therapy Treatment  Patient Details  Name: Leah Hampton MRN: [id number] Date of Birth: 1955-07-14 Referring Provider (PT): Cora Daniels, MD   Encounter Date: 06/27/2019  PT End of Session - 06/27/19 0945    Visit Number  8    Number of Visits  13    Date for PT Re-Evaluation  08/01/19    Authorization Type  UHC MCR    PT Start Time  0940    PT Stop Time  1020    PT Time Calculation (min)  40 min    Activity Tolerance  Patient tolerated treatment well    Behavior During Therapy  [facility] for tasks assessed/performed       Past Medical History:  Diagnosis Date  . Bronchitis   . Cystitis   . Diabetes type 2, controlled ([place])   . Hematuria    urologice eval, Dr Estill Dooms  . History of chest pain   . Hyperbilirubinemia   . Hyperlipemia   . Hypertelorism   . Hypertension   . Leukocytosis   . Obesity   . Osteoarthritis    right knee  . Sleep apnea 2012   slight   . Vaginal Pap smear, abnormal   . Vitamin D deficiency 02/09/2018    Past Surgical History:  Procedure Laterality Date  . COLONOSCOPY WITH PROPOFOL N/A 03/27/2015   Procedure: COLONOSCOPY WITH PROPOFOL;  Surgeon: Milus Banister, MD;  Location: [facility];  Service: Endoscopy;  Laterality: N/A;  . DILATATION & CURETTAGE/HYSTEROSCOPY WITH MYOSURE N/A 01/05/2017   Procedure: DILATATION & CURETTAGE/HYSTEROSCOPY WITH MYOSURE;  Surgeon: Thurnell Lose, MD;  Location: [facility];  Service: Gynecology;  Laterality: N/A;  PostMenopausal Bleeding  . HYSTEROSCOPY WITH D & C N/A 05/22/2019   Procedure: DILATATION AND CURETTAGE /HYSTEROSCOPY, POLYPECTOMY WITH MYOSURE;  Surgeon: Lavonia Drafts, MD;  Location: [facility];  Service: Gynecology;  Laterality: N/A;  . KNEE ARTHROSCOPY  04/2007   left knee  . TOTAL KNEE ARTHROPLASTY  10/2008   Dr Roxine Caddy    There were no  vitals filed for this visit.  Subjective Assessment - 06/27/19 0943    Subjective  Patient reports she is feeling better. Exercises are going well at home.    Patient Stated Goals  Lose weight, get rid of my pain    Currently in Pain?  Yes    Pain Score  5     Pain Location  Knee    Pain Orientation  Right    Pain Descriptors / Indicators  Sore    Pain Type  Chronic pain    Pain Onset  More than a month ago    Pain Frequency  Constant                       [facility] Adult PT Treatment/Exercise - 06/27/19 0001      Exercises   Exercises  Knee/Hip      Knee/Hip Exercises: Aerobic   Nustep  L3 x 5 min with UE/LE      Knee/Hip Exercises: Standing   Heel Raises  2 sets;10 reps    Knee Flexion  2 sets;10 reps    Hip Flexion  2 sets;10 reps    Hip Flexion Limitations  marching    Hip Abduction  2 sets;10 reps    Hip Extension  2 sets;10 reps  Knee/Hip Exercises: Supine   Short Arc Quad Sets  2 sets;10 reps    Short Arc Quad Sets Limitations  ball under knee    Bridges  2 sets;10 reps    Bridges Limitations  bolster under legs due to limited knee motion    Straight Leg Raises  5 reps;3 sets    Straight Leg Raises Limitations  limited range of motion,       Manual Therapy   Manual Therapy  Joint mobilization;Passive ROM    Joint Mobilization  Right patellar mobs all directions, tibiofemoral mobs PA/AP in various ranges of knee flexion    Passive ROM  Right knee flexion/extension             PT Education - 06/27/19 0944    Education Details  HEP    Person(s) Educated  Patient    Methods  Explanation;Demonstration;Verbal cues;Tactile cues    Comprehension  Verbalized understanding;Returned demonstration;Verbal cues required;Tactile cues required;Need further instruction          PT Long Term Goals - 06/25/19 1022      PT LONG TERM GOAL #1   Title  Pt will be able to perform 5 time sit to stand in </= 15 seconds using no UE support.    Baseline   Pt able to stand from mat table with no UE support    Time  6    Period  Weeks    Status  On-going      PT LONG TERM GOAL #2   Title  Pt will improve her R knee flexion to >/= 90 degrees in order to improve functional mobiltiy and gait.    Baseline  0-75    Time  6    Period  Weeks    Status  On-going      PT LONG TERM GOAL #3   Title  Pt will improve her bilateral LE strength to >/= 4/5 in order to improve gait and functional mobility.    Baseline  R knee 3+/5, R and L hip grossly 4-/5 (see flowsheets)    Time  6    Period  Weeks    Status  On-going      PT LONG TERM GOAL #4   Title  Pt will improve her FOTO score from 56% limitation to </= 43 % limitation.    Baseline  56% limitation    Time  6    Period  Weeks    Status  On-going            Plan - 06/27/19 0945    Clinical Impression Statement  Patient is continuing to progress with her general LE strengthening but does continue to report right knee pain with limitation in knee motion. She does require frequent rest breaks with standing exercises and does exhibit antalgic gait on right with increased genu varum. She would benefit from continued skilled PT to progress    PT Treatment/Interventions  ADLs/Self Care Home Management;Cryotherapy;Electrical Stimulation;Traction;Neuromuscular re-education;Balance training;Therapeutic exercise;Therapeutic activities;Gait training;Stair training;Functional mobility training;Ultrasound;Patient/family education;Manual techniques;Passive range of motion;Dry needling;Taping    PT Next Visit Plan  knee ROM, knee strengthening, Hamstring stretching, gait training, balance, Hip stretching/strengthening    PT Home Exercise Plan  [email or web address]: seated LAQ, standing hip abduction, extension, marching, hamstring curl, heel raises    Consulted and Agree with Plan of Care  Patient       Patient will benefit from skilled therapeutic intervention in order to improve the following deficits and  impairments:  Pain, Decreased activity tolerance, Decreased strength, Obesity, Decreased range of motion, Increased edema, Difficulty walking, Abnormal gait  Visit Diagnosis: Chronic pain of right knee  Pain in left hip  Pain in right hip  Muscle weakness (generalized)     Problem List Patient Active Problem List   Diagnosis Date Noted  . Right hip pain 05/28/2019  . Post-menopausal bleeding 05/16/2019  . Greater trochanteric pain syndrome of left lower extremity 03/20/2019  . Diabetes type 2, controlled ([place]) 05/18/2018  . Vitamin D deficiency 02/09/2018  . Onychomycosis 09/19/2017  . Tinea pedis of both feet 09/19/2017  . Obstructive chronic bronchitis with exacerbation ([place]) 05/23/2017  . Elevated hemoglobin A1c 06/22/2016  . Genital herpes 05/23/2012  . Stress incontinence 03/04/2012  . Osteoarthritis of right knee 10/12/2011  . CTS (carpal tunnel syndrome) 07/13/2011  . OSA (obstructive sleep apnea) 12/10/2010  . Menorrhagia 10/07/2010  . BACK PAIN, LUMBAR 08/01/2009  . Morbid obesity, unspecified obesity type ([place]) 07/31/2008  . Hyperlipidemia 07/03/2008  . Essential hypertension 07/03/2008  . HYPERBILIRUBINEMIA 07/03/2008    Hilda Blades, PT, DPT, LAT, ATC 06/27/19  10:27 AM Phone: [phone] Fax: [place] [facility] [address] [place], [place], [address] Phone: [phone]   Fax:  [phone]  Name: Leah Hampton MRN: [id number] Date of Birth: 28-Jul-1955

## 2019-06-29 ENCOUNTER — Telehealth: Payer: Self-pay | Admitting: Family

## 2019-06-29 DIAGNOSIS — I10 Essential (primary) hypertension: Secondary | ICD-10-CM

## 2019-06-29 NOTE — Telephone Encounter (Signed)
Caller Name: Julietta, self Phone: [phone]   Medication: losartan-hydrochlorothiazide (HYZAAR) 100-25 MG tablet Pt changing to mail order and needs new RX for 90 day supply sent in  Has the patient contacted their pharmacy? Yes - new RX needed - last time was prescribed by former PCP  Preferred Pharmacy (with phone number or street name):  [place], [place] [employer] Phone:  [phone]  Fax:  [phone]

## 2019-07-02 ENCOUNTER — Ambulatory Visit: Payer: Medicare Other | Admitting: Physical Therapy

## 2019-07-02 MED ORDER — LOSARTAN POTASSIUM-HCTZ 100-25 MG PO TABS: 1.0000 | ORAL_TABLET | Freq: Every day | ORAL | 1 refills | Status: DC

## 2019-07-04 ENCOUNTER — Other Ambulatory Visit: Payer: Self-pay

## 2019-07-04 ENCOUNTER — Encounter: Payer: Self-pay | Admitting: Physical Therapy

## 2019-07-04 ENCOUNTER — Ambulatory Visit: Payer: Medicare Other | Admitting: Physical Therapy

## 2019-07-04 DIAGNOSIS — M25552 Pain in left hip: Secondary | ICD-10-CM | POA: Diagnosis not present

## 2019-07-04 DIAGNOSIS — G8929 Other chronic pain: Secondary | ICD-10-CM | POA: Diagnosis not present

## 2019-07-04 DIAGNOSIS — M6281 Muscle weakness (generalized): Secondary | ICD-10-CM

## 2019-07-04 DIAGNOSIS — M25551 Pain in right hip: Secondary | ICD-10-CM | POA: Diagnosis not present

## 2019-07-04 DIAGNOSIS — M25561 Pain in right knee: Secondary | ICD-10-CM | POA: Diagnosis not present

## 2019-07-04 NOTE — Therapy (Signed)
[place], [place], [address] Phone: [phone]   Fax:  [phone]  Physical Therapy Treatment  Patient Details  Name: [place] MRN: [id number] Date of Birth: [birth year] Referring Provider (PT): Cora Daniels, MD   Encounter Date: 07/04/2019  PT End of Session - 07/04/19 1015    Visit Number  9    Number of Visits  13    Date for PT Re-Evaluation  08/01/19    Authorization Type  UHC MCR    PT Start Time  0951    PT Stop Time  1030    PT Time Calculation (min)  39 min    Activity Tolerance  Patient tolerated treatment well    Behavior During Therapy  [facility] for tasks assessed/performed       Past Medical History:  Diagnosis Date  . Bronchitis   . Cystitis   . Diabetes type 2, controlled ([place])   . Hematuria    urologice eval, Dr Estill Dooms  . History of chest pain   . Hyperbilirubinemia   . Hyperlipemia   . Hypertelorism   . Hypertension   . Leukocytosis   . Obesity   . Osteoarthritis    right knee  . Sleep apnea 2012   slight   . Vaginal Pap smear, abnormal   . Vitamin D deficiency 02/09/2018    Past Surgical History:  Procedure Laterality Date  . COLONOSCOPY WITH PROPOFOL N/A 03/27/2015   Procedure: COLONOSCOPY WITH PROPOFOL;  Surgeon: Milus Banister, MD;  Location: [facility];  Service: Endoscopy;  Laterality: N/A;  . DILATATION & CURETTAGE/HYSTEROSCOPY WITH MYOSURE N/A 01/05/2017   Procedure: DILATATION & CURETTAGE/HYSTEROSCOPY WITH MYOSURE;  Surgeon: Thurnell Lose, MD;  Location: [facility];  Service: Gynecology;  Laterality: N/A;  PostMenopausal Bleeding  . HYSTEROSCOPY WITH D & C N/A 05/22/2019   Procedure: DILATATION AND CURETTAGE /HYSTEROSCOPY, POLYPECTOMY WITH MYOSURE;  Surgeon: Lavonia Drafts, MD;  Location: [facility];  Service: Gynecology;  Laterality: N/A;  . KNEE ARTHROSCOPY  04/2007   left knee  . TOTAL KNEE ARTHROPLASTY  10/2008   Dr Roxine Caddy    There were no  vitals filed for this visit.  Subjective Assessment - 07/04/19 1008    Subjective  Patient reports she continues to improve. She has brought a knee brace her doctor gave her and she would like help learning how to put it on.    Patient Stated Goals  Lose weight, get rid of my pain    Currently in Pain?  Yes    Pain Score  5     Pain Location  Knee    Pain Orientation  Right    Pain Descriptors / Indicators  Sore    Pain Type  Chronic pain    Pain Onset  More than a month ago    Pain Frequency  Intermittent                       [facility] Adult PT Treatment/Exercise - 07/04/19 0001      Self-Care   Self-Care  Other Self-Care Comments    Other Self-Care Comments   Patient was instructed on brace use including how to don/doff brace and keep it positioned appropriately      Exercises   Exercises  Knee/Hip      Knee/Hip Exercises: Standing   Heel Raises  2 sets;10 reps    Knee Flexion  2 sets;10 reps    Hip Flexion  2 sets;10 reps    Hip Flexion Limitations  marching    Hip Abduction  2 sets;10 reps    Hip Extension  2 sets;10 reps      Knee/Hip Exercises: Supine   Bridges  2 sets;10 reps    Bridges Limitations  bolster under legs due to limited knee motion    Straight Leg Raises  2 sets;10 reps             PT Education - 07/04/19 1015    Education Details  HEP, knee brace    Person(s) Educated  Patient    Methods  Explanation;Demonstration;Verbal cues;Tactile cues    Comprehension  Verbalized understanding;Returned demonstration;Verbal cues required;Tactile cues required;Need further instruction          PT Long Term Goals - 06/25/19 1022      PT LONG TERM GOAL #1   Title  Pt will be able to perform 5 time sit to stand in </= 15 seconds using no UE support.    Baseline  Pt able to stand from mat table with no UE support    Time  6    Period  Weeks    Status  On-going      PT LONG TERM GOAL #2   Title  Pt will improve her R knee flexion to >/=  90 degrees in order to improve functional mobiltiy and gait.    Baseline  0-75    Time  6    Period  Weeks    Status  On-going      PT LONG TERM GOAL #3   Title  Pt will improve her bilateral LE strength to >/= 4/5 in order to improve gait and functional mobility.    Baseline  R knee 3+/5, R and L hip grossly 4-/5 (see flowsheets)    Time  6    Period  Weeks    Status  On-going      PT LONG TERM GOAL #4   Title  Pt will improve her FOTO score from 56% limitation to </= 43 % limitation.    Baseline  56% limitation    Time  6    Period  Weeks    Status  On-going            Plan - 07/04/19 1017    Clinical Impression Statement  Patient tolerated therapy well and seems to be improving even though she continues to report same pain level. She required max cueing for donning her knee brace and she performed all her exercises with it on. She reports that she feels she is ready to be discharged but with discussion she will come in for her next scheduled appointment on 07/09/2019 for assessment. She would benefit from continued skilled PT to progress her motion and strength to allow for improved walking ability.    PT Treatment/Interventions  ADLs/Self Care Home Management;Cryotherapy;Electrical Stimulation;Traction;Neuromuscular re-education;Balance training;Therapeutic exercise;Therapeutic activities;Gait training;Stair training;Functional mobility training;Ultrasound;Patient/family education;Manual techniques;Passive range of motion;Dry needling;Taping    PT Next Visit Plan  knee ROM, knee strengthening, Hamstring stretching, gait training, balance, Hip stretching/strengthening    PT Home Exercise Plan  [id number]: seated LAQ, standing hip abduction, extension, marching, hamstring curl, heel raises    Consulted and Agree with Plan of Care  Patient       Patient will benefit from skilled therapeutic intervention in order to improve the following deficits and impairments:  Pain, Decreased  activity tolerance, Decreased strength, Obesity, Decreased range of motion, Increased edema,  Difficulty walking, Abnormal gait  Visit Diagnosis: Chronic pain of right knee  Pain in left hip  Pain in right hip  Muscle weakness (generalized)     Problem List Patient Active Problem List   Diagnosis Date Noted  . Right hip pain 05/28/2019  . Post-menopausal bleeding 05/16/2019  . Greater trochanteric pain syndrome of left lower extremity 03/20/2019  . Diabetes type 2, controlled ([place]) 05/18/2018  . Vitamin D deficiency 02/09/2018  . Onychomycosis 09/19/2017  . Tinea pedis of both feet 09/19/2017  . Obstructive chronic bronchitis with exacerbation ([place]) 05/23/2017  . Elevated hemoglobin A1c 06/22/2016  . Genital herpes 05/23/2012  . Stress incontinence 03/04/2012  . Osteoarthritis of right knee 10/12/2011  . CTS (carpal tunnel syndrome) 07/13/2011  . OSA (obstructive sleep apnea) 12/10/2010  . Menorrhagia 10/07/2010  . BACK PAIN, LUMBAR 08/01/2009  . Morbid obesity, unspecified obesity type ([place]) 07/31/2008  . Hyperlipidemia 07/03/2008  . Essential hypertension 07/03/2008  . HYPERBILIRUBINEMIA 07/03/2008    Hilda Blades, PT, DPT, LAT, ATC 07/04/19  10:30 AM Phone: [phone] Fax: [place] [address] [place], [place], [address] Phone: [phone]   Fax:  [phone]  Name: Leah [place] MRN: [id number] Date of Birth: [birth year]

## 2019-07-09 ENCOUNTER — Other Ambulatory Visit: Payer: Self-pay

## 2019-07-09 ENCOUNTER — Ambulatory Visit: Payer: Medicare Other | Admitting: Physical Therapy

## 2019-07-09 ENCOUNTER — Encounter: Payer: Self-pay | Admitting: Physical Therapy

## 2019-07-09 DIAGNOSIS — M25561 Pain in right knee: Secondary | ICD-10-CM | POA: Diagnosis not present

## 2019-07-09 DIAGNOSIS — M6281 Muscle weakness (generalized): Secondary | ICD-10-CM

## 2019-07-09 DIAGNOSIS — G8929 Other chronic pain: Secondary | ICD-10-CM

## 2019-07-09 DIAGNOSIS — M25552 Pain in left hip: Secondary | ICD-10-CM | POA: Diagnosis not present

## 2019-07-09 DIAGNOSIS — M25551 Pain in right hip: Secondary | ICD-10-CM | POA: Diagnosis not present

## 2019-07-09 NOTE — Therapy (Signed)
[place], [place], [address] Phone: [phone]   Fax:  [phone]  Physical Therapy Treatment  Discharge  PHYSICAL THERAPY DISCHARGE SUMMARY  Visits from Start of Care: 10  Current functional level related to goals / functional outcomes: Patient continues to exhibit limitation in range of motion and function   Remaining deficits: Range of motion, strength   Education / Equipment: HEP Plan: Patient agrees to discharge.  Patient goals were partially met. Patient is being discharged due to being pleased with the current functional level.  ?????      Patient Details  Name: Leah [place] MRN: [id number] Date of Birth: 1956/02/21 Referring Provider (PT): Cora Daniels, MD   Encounter Date: 07/09/2019  PT End of Session - 07/09/19 1047    Visit Number  10    Number of Visits  13    Authorization Type  UHC MCR    PT Start Time  8159    PT Stop Time  1120    PT Time Calculation (min)  38 min    Activity Tolerance  Patient tolerated treatment well    Behavior During Therapy  [facility] for tasks assessed/performed       Past Medical History:  Diagnosis Date  . Bronchitis   . Cystitis   . Diabetes type 2, controlled ([place])   . Hematuria    urologice eval, Dr Estill Dooms  . History of chest pain   . Hyperbilirubinemia   . Hyperlipemia   . Hypertelorism   . Hypertension   . Leukocytosis   . Obesity   . Osteoarthritis    right knee  . Sleep apnea 2012   slight   . Vaginal Pap smear, abnormal   . Vitamin D deficiency 02/09/2018    Past Surgical History:  Procedure Laterality Date  . COLONOSCOPY WITH PROPOFOL N/A 03/27/2015   Procedure: COLONOSCOPY WITH PROPOFOL;  Surgeon: Milus Banister, MD;  [facility];  Service: Endoscopy;  Laterality: N/A;  . DILATATION & CURETTAGE/HYSTEROSCOPY WITH MYOSURE N/A 01/05/2017   Procedure: DILATATION & CURETTAGE/HYSTEROSCOPY WITH MYOSURE;  Surgeon:  Thurnell Lose, MD;  Location: [facility];  Service: Gynecology;  Laterality: N/A;  PostMenopausal Bleeding  . HYSTEROSCOPY WITH D & C N/A 05/22/2019   Procedure: DILATATION AND CURETTAGE /HYSTEROSCOPY, POLYPECTOMY WITH MYOSURE;  Surgeon: Lavonia Drafts, MD;  Location: [facility];  Service: Gynecology;  Laterality: N/A;  . KNEE ARTHROSCOPY  04/2007   left knee  . TOTAL KNEE ARTHROPLASTY  10/2008   Dr Roxine Caddy    There were no vitals filed for this visit.  Subjective Assessment - 07/09/19 1045    Subjective  Patient reports she is doing well today. She is ready to discharge to performing exercises on her own.    Patient Stated Goals  Lose weight, get rid of my pain    Currently in Pain?  Yes    Pain Score  5     Pain Location  Knee    Pain Orientation  Right    Pain Descriptors / Indicators  Aching;Sore    Pain Type  Chronic pain    Pain Onset  More than a month ago    Pain Frequency  Constant    Aggravating Factors   standing, walking         [facility] PT Assessment - 07/09/19 0001      Assessment   Medical Diagnosis  R knee pain, L hip pain, m17.11, M25.552,  Referring Provider (PT)  Cora Daniels, MD      Precautions   Precautions  None      Restrictions   Weight Bearing Restrictions  No      Balance Screen   Has the patient fallen in the past 6 months  No    Has the patient had a decrease in activity level because of a fear of falling?   No    Is the patient reluctant to leave their home because of a fear of falling?   No      Prior Function   Level of Independence  Independent      Observation/Other Assessments   Focus on Therapeutic Outcomes (FOTO)   58% limitation      AROM   Right Knee Extension  0    Right Knee Flexion  78      Strength   Right Hip Flexion  4-/5    Right Hip Extension  4-/5    Right Hip ABduction  4-/5    Left Hip Flexion  4/5    Left Hip Extension  4-/5    Left Hip ABduction  4-/5    Right Knee Flexion  4+/5    Right Knee Extension   4/5    Left Knee Flexion  4+/5    Left Knee Extension  4+/5      Transfers   Five time sit to stand comments   14 seconds without UE support                   [facility] Adult PT Treatment/Exercise - 07/09/19 0001      Exercises   Exercises  Knee/Hip      Knee/Hip Exercises: Standing   Heel Raises  10 reps    Knee Flexion  10 reps    Hip Flexion  10 reps    Hip Flexion Limitations  marching    Hip Abduction  10 reps    Hip Extension  10 reps      Knee/Hip Exercises: Seated   Long Arc Quad  10 reps    Sit to [employer]  10 reps             PT Education - 07/09/19 1046    Education Details  HEP    Person(s) Educated  Patient    Methods  Explanation    Comprehension  Verbalized understanding;Returned demonstration          PT Long Term Goals - 07/09/19 1056      PT LONG TERM GOAL #1   Title  Pt will be able to perform 5 time sit to stand in </= 15 seconds using no UE support.    Baseline  14 seconds    Time  6    Period  Weeks    Status  Achieved      PT LONG TERM GOAL #2   Title  Pt will improve her R knee flexion to >/= 90 degrees in order to improve functional mobiltiy and gait.    Baseline  0-78    Time  6    Period  Weeks    Status  Not Met      PT LONG TERM GOAL #3   Title  Pt will improve her bilateral LE strength to >/= 4/5 in order to improve gait and functional mobility.    Baseline  hip grossly 4-/5, knee grossly 4/5    Time  6    Period  Weeks  Status  Partially Met      PT LONG TERM GOAL #4   Title  Pt will improve her FOTO score from 56% limitation to </= 43 % limitation.    Baseline  58% limitation    Time  6    Period  Weeks    Status  Not Met            Plan - 07/09/19 1048    Clinical Impression Statement  Patient tolerated therapy well with no adverse effects. She exhibits improved motion and strength of right knee and general LE. She has not met all her goals but she believes she is ready to discharge to  independent HEP and walking program to progress.    PT Treatment/Interventions  ADLs/Self Care Home Management;Cryotherapy;Electrical Stimulation;Traction;Neuromuscular re-education;Balance training;Therapeutic exercise;Therapeutic activities;Gait training;Stair training;Functional mobility training;Ultrasound;Patient/family education;Manual techniques;Passive range of motion;Dry needling;Taping    PT Next Visit Plan  NA    PT Home Exercise Plan  [id number]: seated LAQ, standing hip abduction, extension, marching, hamstring curl, heel raises    Consulted and Agree with Plan of Care  Patient       Patient will benefit from skilled therapeutic intervention in order to improve the following deficits and impairments:  Pain, Decreased activity tolerance, Decreased strength, Obesity, Decreased range of motion, Increased edema, Difficulty walking, Abnormal gait  Visit Diagnosis: Chronic pain of right knee  Pain in left hip  Pain in right hip  Muscle weakness (generalized)     Problem List Patient Active Problem List   Diagnosis Date Noted  . Right hip pain 05/28/2019  . Post-menopausal bleeding 05/16/2019  . Greater trochanteric pain syndrome of left lower extremity 03/20/2019  . Diabetes type 2, controlled ([place]) 05/18/2018  . Vitamin D deficiency 02/09/2018  . Onychomycosis 09/19/2017  . Tinea pedis of both feet 09/19/2017  . Obstructive chronic bronchitis with exacerbation ([place]) 05/23/2017  . Elevated hemoglobin A1c 06/22/2016  . Genital herpes 05/23/2012  . Stress incontinence 03/04/2012  . Osteoarthritis of right knee 10/12/2011  . CTS (carpal tunnel syndrome) 07/13/2011  . OSA (obstructive sleep apnea) 12/10/2010  . Menorrhagia 10/07/2010  . BACK PAIN, LUMBAR 08/01/2009  . Morbid obesity, unspecified obesity type ([place]) 07/31/2008  . Hyperlipidemia 07/03/2008  . Essential hypertension 07/03/2008  . HYPERBILIRUBINEMIA 07/03/2008    Hilda Blades, PT, DPT, LAT, ATC 07/09/19   11:22 AM Phone: [phone] Fax: [facility] [facility] [address] [place], [place], [address] Phone: [phone]   Fax:  [phone]  Name: Leah [place] MRN: [id number] Date of Birth: 01-Aug-1955

## 2019-07-10 ENCOUNTER — Encounter: Payer: Self-pay | Admitting: Family Medicine

## 2019-07-10 ENCOUNTER — Other Ambulatory Visit: Payer: Self-pay

## 2019-07-10 ENCOUNTER — Ambulatory Visit (INDEPENDENT_AMBULATORY_CARE_PROVIDER_SITE_OTHER): Payer: Medicare Other | Admitting: Family Medicine

## 2019-07-10 DIAGNOSIS — M1711 Unilateral primary osteoarthritis, right knee: Secondary | ICD-10-CM

## 2019-07-10 DIAGNOSIS — M25552 Pain in left hip: Secondary | ICD-10-CM | POA: Diagnosis not present

## 2019-07-10 NOTE — Assessment & Plan Note (Signed)
Counseled on weight management.  Discussed that medications may be the best foot forward.  She has been trying over-the-counter supplements at this time.

## 2019-07-10 NOTE — Assessment & Plan Note (Signed)
Pain is intermittent in nature.  Has been equipped with the medial unloader brace. -Counseled on home exercise therapy and supportive care. -Can consider gel injections.

## 2019-07-10 NOTE — Assessment & Plan Note (Signed)
Pain has improved with physical therapy. -Counseled on home exercise therapy and supportive care. -Can consider injection if needed.

## 2019-07-10 NOTE — Progress Notes (Signed)
Leah [place] - 64 y.o. female MRN [id number]  Date of birth: April 03, 1956  SUBJECTIVE:  Including CC & ROS.  Chief Complaint  Patient presents with  . Follow-up    follow up for right knee    Leah [place] is a 64 y.o. female that is following up for her right knee pain and left hip pain.  She has been through physical therapy and has noticed an improvement of her range of motion and stability.  She was fitted for the medial unloader brace has tried intermittently.  She notices some pain in the lower back as well.  Denies any mechanical symptoms in the knee.  Independent review of the x-ray from 1/11 shows a possible pincer lesion on the left.   Independent review of the right knee x-rays show severe degenerative changes in the medial compartment.  Review of Systems See HPI   HISTORY: Past Medical, Surgical, Social, and Family History Reviewed & Updated per EMR.   Pertinent Historical Findings include:  Past Medical History:  Diagnosis Date  . Bronchitis   . Cystitis   . Diabetes type 2, controlled ([place])   . Hematuria    urologice eval, Dr Estill Dooms  . History of chest pain   . Hyperbilirubinemia   . Hyperlipemia   . Hypertelorism   . Hypertension   . Leukocytosis   . Obesity   . Osteoarthritis    right knee  . Sleep apnea 2012   slight   . Vaginal Pap smear, abnormal   . Vitamin D deficiency 02/09/2018    Past Surgical History:  Procedure Laterality Date  . COLONOSCOPY WITH PROPOFOL N/A 03/27/2015   Procedure: COLONOSCOPY WITH PROPOFOL;  Surgeon: Milus Banister, MD;  [facility];  Service: Endoscopy;  Laterality: N/A;  . DILATATION & CURETTAGE/HYSTEROSCOPY WITH MYOSURE N/A 01/05/2017   Procedure: DILATATION & CURETTAGE/HYSTEROSCOPY WITH MYOSURE;  Surgeon: Thurnell Lose, MD;  Location: [facility];  Service: Gynecology;  Laterality: N/A;  PostMenopausal Bleeding  . HYSTEROSCOPY WITH D & C N/A 05/22/2019   Procedure: DILATATION AND CURETTAGE /HYSTEROSCOPY,  POLYPECTOMY WITH MYOSURE;  Surgeon: Lavonia Drafts, MD;  Location: [facility];  Service: Gynecology;  Laterality: N/A;  . KNEE ARTHROSCOPY  04/2007   left knee  . TOTAL KNEE ARTHROPLASTY  10/2008   Dr Roxine Caddy    Family History  Problem Relation Age of Onset  . Heart failure Mother   . Arthritis Mother   . Hypertension Mother   . Stroke Mother   . Heart attack Father 80  . Diabetes Other   . Arthritis Other   . Stroke Other   . Coronary artery disease Other   . Heart failure Sister        x 2  . Colon cancer Neg Hx     Social History   Socioeconomic History  . Marital status: Single    Spouse name: Not on file  . Number of children: 2  . Years of education: Not on file  . Highest education level: Not on file  Occupational History  . Occupation: disabled  Tobacco Use  . Smoking status: Never Smoker  . Smokeless tobacco: Never Used  Substance and Sexual Activity  . Alcohol use: No  . Drug use: No  . Sexual activity: Not Currently  Other Topics Concern  . Not on file  Social History Narrative   Regular exercise:  No      Lives alone   Has 2 grown sons, both local.  1 grandson   On disability due to her knee pain   Completed 12th grade   Enjoys television   No pets.    Social Determinants of Health   Financial Resource Strain:   . Difficulty of Paying Living Expenses: Not on file  Food Insecurity:   . Worried About Charity fundraiser in the Last Year: Not on file  . Ran Out of Food in the Last Year: Not on file  Transportation Needs:   . Lack of Transportation (Medical): Not on file  . Lack of Transportation (Non-Medical): Not on file  Physical Activity:   . Days of Exercise per Week: Not on file  . Minutes of Exercise per Session: Not on file  Stress:   . Feeling of Stress : Not on file  Social Connections:   . Frequency of Communication with Friends and Family: Not on file  . Frequency of Social Gatherings with Friends and Family: Not on file  .  Attends Religious Services: Not on file  . Active Member of Clubs or Organizations: Not on file  . Attends Archivist Meetings: Not on file  . Marital Status: Not on file  Intimate Partner Violence:   . Fear of Current or Ex-Partner: Not on file  . Emotionally Abused: Not on file  . Physically Abused: Not on file  . Sexually Abused: Not on file     PHYSICAL EXAM:  VS: BP (!) 153/85   Pulse 84   Ht 5\' 5"  (1.651 m)   Wt (!) 312 lb 12.8 oz (141.9 kg)   LMP 11/10/2011   BMI 52.05 kg/m  Physical Exam Gen: NAD, alert, cooperative with exam, well-appearing MSK:  Right knee: No obvious effusion. Normal range of motion. Normal strength resistance. Instability valgus varus stress testing. Right and left hip: No tenderness to palpation over the greater trochanter. Normal internal and external rotation. Normal strength resistance with hip flexion. Neurovascularly intact     ASSESSMENT & PLAN:   Greater trochanteric pain syndrome of left lower extremity Pain has improved with physical therapy. -Counseled on home exercise therapy and supportive care. -Can consider injection if needed.  Osteoarthritis of right knee Pain is intermittent in nature.  Has been equipped with the medial unloader brace. -Counseled on home exercise therapy and supportive care. -Can consider gel injections.    Morbid obesity, unspecified obesity type ([place]) Counseled on weight management.  Discussed that medications may be the best foot forward.  She has been trying over-the-counter supplements at this time.

## 2019-07-10 NOTE — Patient Instructions (Signed)
Good to see you Please try ice  Please continue the exercises  Please let us know if you would like to try the gel injections   Please send me a message in MyChart with any questions or updates.  Please see me back in 6-8 weeks.   --Dr. Raeford Razor

## 2019-07-12 ENCOUNTER — Ambulatory Visit: Payer: Medicare Other | Admitting: Physical Therapy

## 2019-07-23 NOTE — Progress Notes (Signed)
.  Nurse connected with patient 07/24/19 at 11:00 AM EST by a telephone enabled telemedicine application and verified that I am speaking with the correct person using two identifiers. Patient stated full name and DOB. Patient gave permission to continue with virtual visit. Patient's location was at home and Nurse's location was at [place] office.   Subjective:   Leah Hampton is a 64 y.o. female who presents for an Initial Medicare Annual Wellness Visit.  Review of Systems   Home Safety/Smoke Alarms: Feels safe in home. Smoke alarms in place.  Lives in 2 story townhouse. Son stays with her often. Has shower chair.   Female:   Pap-04/23/19       Mammo- 11/24/18           CCS- 03/27/15. Recall 5 yrs.      Objective:     Advanced Directives 07/24/2019 06/15/2019 05/17/2019 12/27/2016 06/22/2016 06/15/2016 03/22/2016  Does Patient Have a Medical Advance Directive? _0  No No  Would patient like information on creating a medical advance directive? No - Patient declined No - Patient declined No - Guardian declined Yes (MAU/Ambulatory/Procedural Areas - Information given) - No - Patient declined No - patient declined information    Current Medications (verified) Outpatient Encounter Medications as of 07/24/2019  Medication Sig  . b complex vitamins tablet Take 1 tablet by mouth daily.  . baclofen (LIORESAL) 10 MG tablet Take 0.5 tablets (5 mg total) by mouth 2 (two) times daily as needed for muscle spasms.  . Cholecalciferol (VITAMIN D3) 125 MCG (5000 UT) TABS Take 5,000 Units by mouth daily in the afternoon.  Marland Kitchen FLOVENT HFA 110 MCG/ACT inhaler INHALE 2 PUFFS TWICE DAILY  . ibuprofen (ADVIL) 600 MG tablet Take 1 tablet (600 mg total) by mouth every 6 (six) hours as needed for moderate pain or cramping.  Marland Kitchen losartan-hydrochlorothiazide (HYZAAR) 100-25 MG tablet Take 1 tablet by mouth daily.  Marland Kitchen omeprazole (PRILOSEC) 20 MG capsule TAKE 1 CAPSULE EVERY DAY (Patient taking differently: Take 20  mg by mouth daily before breakfast. )  . potassium chloride SA (K-DUR) 20 MEQ tablet Take 1 tablet (20 mEq total) by mouth daily.  . pravastatin (PRAVACHOL) 80 MG tablet Take 1 tablet (80 mg total) by mouth daily. (Patient taking differently: Take 80 mg by mouth every evening. )  . valACYclovir (VALTREX) 1000 MG tablet TAKE 1 TABLET EVERY DAY (Patient taking differently: Take 1,000 mg by mouth daily. )  . blood glucose meter kit and supplies KIT Dispense based on patient and insurance preference. Use up to four times daily as directed. (FOR ICD-9 250.00, 250.01). (Patient not taking: Reported on 06/11/2019)  . Cholecalciferol (VITAMIN D3) 75 MCG (3000 UT) TABS Take 1 tablet by mouth daily. (Patient not taking: Reported on 05/16/2019)  . clotrimazole-betamethasone (LOTRISONE) cream APPLY TOPICALLY TWICE DAILY (Patient not taking: Reported on 05/16/2019)  . meloxicam (MOBIC) 7.5 MG tablet Take 1 tablet (7.5 mg total) by mouth daily as needed for pain. (Patient not taking: Reported on 07/24/2019)   No facility-administered encounter medications on file as of 07/24/2019.    Allergies (verified) Sulfamethoxazole-trimethoprim [bactrim] and Codeine   History: Past Medical History:  Diagnosis Date  . Bronchitis   . Cystitis   . Diabetes type 2, controlled ([place])   . Hematuria    urologice eval, Dr Estill Dooms  . History of chest pain   . Hyperbilirubinemia   . Hyperlipemia   . Hypertelorism   . Hypertension   .  Leukocytosis   . Obesity   . Osteoarthritis    right knee  . Sleep apnea 2012   slight   . Vaginal Pap smear, abnormal   . Vitamin D deficiency 02/09/2018   Past Surgical History:  Procedure Laterality Date  . COLONOSCOPY WITH PROPOFOL N/A 03/27/2015   Procedure: COLONOSCOPY WITH PROPOFOL;  Surgeon: Daniel P Jacobs, MD;  Location: [facility];  Service: Endoscopy;  Laterality: N/A;  . DILATATION & CURETTAGE/HYSTEROSCOPY WITH MYOSURE N/A 01/05/2017   Procedure: DILATATION &  CURETTAGE/HYSTEROSCOPY WITH MYOSURE;  Surgeon: Varnado, Evelyn, MD;  Location: [facility];  Service: Gynecology;  Laterality: N/A;  PostMenopausal Bleeding  . HYSTEROSCOPY WITH D & C N/A 05/22/2019   Procedure: DILATATION AND CURETTAGE /HYSTEROSCOPY, POLYPECTOMY WITH MYOSURE;  Surgeon: Harraway-Smith, Carolyn, MD;  Location: [facility];  Service: Gynecology;  Laterality: N/A;  . KNEE ARTHROSCOPY  04/2007   left knee  . TOTAL KNEE ARTHROPLASTY  10/2008   Dr Caffri   Family History  Problem Relation Age of Onset  . Heart failure Mother   . Arthritis Mother   . Hypertension Mother   . Stroke Mother   . Heart attack Father 70  . Diabetes Other   . Arthritis Other   . Stroke Other   . Coronary artery disease Other   . Heart failure Sister        x 2  . Colon cancer Neg Hx    Social History   Socioeconomic History  . Marital status: Single    Spouse name: Not on file  . Number of children: 2  . Years of education: Not on file  . Highest education level: Not on file  Occupational History  . Occupation: disabled  Tobacco Use  . Smoking status: Never Smoker  . Smokeless tobacco: Never Used  Substance and Sexual Activity  . Alcohol use: No  . Drug use: No  . Sexual activity: Not Currently  Other Topics Concern  . Not on file  Social History Narrative   Regular exercise:  No      Lives alone   Has 2 grown sons, both local.  1 grandson   On disability due to her knee pain   Completed 12th grade   Enjoys television   No pets.    Social Determinants of Health   Financial Resource Strain: Low Risk   . Difficulty of Paying Living Expenses: Not hard at all  Food Insecurity: No Food Insecurity  . Worried About Running Out of Food in the Last Year: Never true  . Ran Out of Food in the Last Year: Never true  Transportation Needs: No Transportation Needs  . Lack of Transportation (Medical): No  . Lack of Transportation (Non-Medical): No  Physical Activity:   . Days of Exercise per  Week: Not on file  . Minutes of Exercise per Session: Not on file  Stress:   . Feeling of Stress : Not on file  Social Connections:   . Frequency of Communication with Friends and Family: Not on file  . Frequency of Social Gatherings with Friends and Family: Not on file  . Attends Religious Services: Not on file  . Active Member of Clubs or Organizations: Not on file  . Attends Club or Organization Meetings: Not on file  . Marital Status: Not on file    Tobacco Counseling Counseling given: Not Answered   Clinical Intake: Pain : No/denies pain   Activities of Daily Living In your present state of health, do   you have any difficulty performing the following activities: 07/24/2019 05/17/2019  Hearing? N N  Vision? N N  Difficulty concentrating or making decisions? N N  Walking or climbing stairs? N N  Dressing or bathing? N N  Doing errands, shopping? N N  Preparing Food and eating ? N -  Using the Toilet? N -  In the past six months, have you accidently leaked urine? Y -  Do you have problems with loss of bowel control? N -  Managing your Medications? N -  Managing your Finances? N -  Housekeeping or managing your Housekeeping? N -  Some recent data might be hidden     Immunizations and Health Maintenance Immunization History  Administered Date(s) Administered  . Fluad Quad(high Dose 65+) 02/09/2019  . Influenza Whole 02/24/2010  . Influenza,inj,Quad PF,6+ Mos 05/01/2014, 03/22/2016, 01/04/2018  . Pneumococcal Polysaccharide-23 06/13/2014  . Td 07/03/2008   Health Maintenance Due  Topic Date Due  . TETANUS/TDAP  07/03/2018  . OPHTHALMOLOGY EXAM  06/21/2019    Patient Care Team: Debbrah Alar, NP as PCP - General (Internal Medicine) Debbrah Alar, NP as Nurse Practitioner (Internal Medicine)  Indicate any recent Medical Services you may have received from other than [facility] providers in the past year (date may be approximate).     Assessment:   This is  a routine wellness examination for Cindie. Physical assessment deferred to PCP.  Hearing/Vision screen Unable to assess. This visit is enabled though telemedicine due to Covid 19.   Dietary issues and exercise activities discussed: Current Exercise Habits: Home exercise routine, Type of exercise: stretching, Time (Minutes): 10, Intensity: Mild, Exercise limited by: None identified Diet (meal preparation, eat out, water intake, caffeinated beverages, dairy products, fruits and vegetables): well balanced   Goals    . Increase physical activity      Depression Screen PHQ 2/9 Scores 07/24/2019 04/07/2018 01/04/2018 09/21/2017 08/22/2017 05/23/2017 06/22/2016  PHQ - 2 Score 0 - _0 PHQ- 9 Score - - _1 Exception Documentation - Patient refusal - - - - -    Fall Risk Fall Risk  07/24/2019 06/22/2016 03/22/2016 02/07/2015 10/22/2014  Falls in the past year? 0 No No No No  Number falls in past yr: 0 - - - -  Injury with Fall? 0 - - - -  Follow up Education provided;Falls prevention discussed - - - -    Cognitive Function: Ad8 score reviewed for issues:  Issues making decisions:no  Less interest in hobbies / activities:no  Repeats questions, stories (family complaining): no  Trouble using ordinary gadgets (microwave, computer, phone):no  Forgets the month or year: no  Mismanaging finances: no  Remembering appts:no Daily problems with thinking and/or memory:no Ad8 score is=0         Screening Tests Health Maintenance  Topic Date Due  . TETANUS/TDAP  07/03/2018  . OPHTHALMOLOGY EXAM  06/21/2019  . HEMOGLOBIN A1C  11/06/2019  . FOOT EXAM  03/08/2020  . MAMMOGRAM  11/23/2020  . PAP SMEAR-Modifier  04/22/2022  . COLONOSCOPY  03/26/2025  . INFLUENZA VACCINE  Completed  . PNEUMOCOCCAL POLYSACCHARIDE VACCINE AGE 32-64 HIGH RISK  Completed  . Hepatitis C Screening  Completed  . HIV Screening  Completed    Plan:    Please schedule your next medicare wellness visit with me  in 1 yr.  Continue to eat heart healthy diet (full of fruits, vegetables, whole grains, lean protein, water--limit salt, fat, and  sugar intake) and increase physical activity as tolerated.  Continue doing brain stimulating activities (puzzles, reading, adult coloring books, staying active) to keep memory sharp.     I have personally reviewed and noted the following in the patient's chart:   . Medical and social history . Use of alcohol, tobacco or illicit drugs  . Current medications and supplements . Functional ability and status . Nutritional status . Physical activity . Advanced directives . List of other physicians . Hospitalizations, surgeries, and ER visits in previous 12 months . Vitals . Screenings to include cognitive, depression, and falls . Referrals and appointments  In addition, I have reviewed and discussed with patient certain preventive protocols, quality metrics, and best practice recommendations. A written personalized care plan for preventive services as well as general preventive health recommendations were provided to patient.     Shela Nevin, [place]   07/24/2019

## 2019-07-24 ENCOUNTER — Other Ambulatory Visit: Payer: Self-pay

## 2019-07-24 ENCOUNTER — Ambulatory Visit (INDEPENDENT_AMBULATORY_CARE_PROVIDER_SITE_OTHER): Payer: Medicare Other | Admitting: *Deleted

## 2019-07-24 ENCOUNTER — Encounter: Payer: Self-pay | Admitting: *Deleted

## 2019-07-24 DIAGNOSIS — Z0000 Encounter for general adult medical examination without abnormal findings: Secondary | ICD-10-CM | POA: Diagnosis not present

## 2019-07-24 NOTE — Patient Instructions (Signed)
Please schedule your next medicare wellness visit with me in 1 yr.  Continue to eat heart healthy diet (full of fruits, vegetables, whole grains, lean protein, water--limit salt, fat, and sugar intake) and increase physical activity as tolerated.  Continue doing brain stimulating activities (puzzles, reading, adult coloring books, staying active) to keep memory sharp.    Leah Hampton , Thank you for taking time to come for your Medicare Wellness Visit. I appreciate your ongoing commitment to your health goals. Please review the following plan we discussed and let me know if I can assist you in the future.   These are the goals we discussed: Goals    . Increase physical activity       This is a list of the screening recommended for you and due dates:  Health Maintenance  Topic Date Due  . Tetanus Vaccine  07/03/2018  . Eye exam for diabetics  06/21/2019  . Hemoglobin A1C  11/06/2019  . Complete foot exam   03/08/2020  . Mammogram  11/23/2020  . Pap Smear  04/22/2022  . Colon Cancer Screening  03/26/2025  . Flu Shot  Completed  . Pneumococcal vaccine  Completed  .  Hepatitis C: One time screening is recommended by Center for Disease Control  (CDC) for  adults born from [birth year] through 1963.   Completed  . HIV Screening  Completed    Preventive Care 2-61 Years Old, Female Preventive care refers to visits with your health care provider and lifestyle choices that can promote health and wellness. This includes:  A yearly physical exam. This may also be called an annual well check.  Regular dental visits and eye exams.  Immunizations.  Screening for certain conditions.  Healthy lifestyle choices, such as eating a healthy diet, getting regular exercise, not using drugs or products that contain nicotine and tobacco, and limiting alcohol use. What can I expect for my preventive care visit? Physical exam Your health care provider will check your:  Height and weight. This may be used  to calculate body mass index (BMI), which tells if you are at a healthy weight.  Heart rate and blood pressure.  Skin for abnormal spots. Counseling Your health care provider may ask you questions about your:  Alcohol, tobacco, and drug use.  Emotional well-being.  Home and relationship well-being.  Sexual activity.  Eating habits.  Work and work Statistician.  Method of birth control.  Menstrual cycle.  Pregnancy history. What immunizations do I need?  Influenza (flu) vaccine  This is recommended every year. Tetanus, diphtheria, and pertussis (Tdap) vaccine  You may need a Td booster every 10 years. Varicella (chickenpox) vaccine  You may need this if you have not been vaccinated. Zoster (shingles) vaccine  You may need this after age 60. Measles, mumps, and rubella (MMR) vaccine  You may need at least one dose of MMR if you were born in 1957 or later. You may also need a second dose. Pneumococcal conjugate (PCV13) vaccine  You may need this if you have certain conditions and were not previously vaccinated. Pneumococcal polysaccharide (PPSV23) vaccine  You may need one or two doses if you smoke cigarettes or if you have certain conditions. Meningococcal conjugate (MenACWY) vaccine  You may need this if you have certain conditions. Hepatitis A vaccine  You may need this if you have certain conditions or if you travel or work in places where you may be exposed to hepatitis A. Hepatitis B vaccine  You may need this  if you have certain conditions or if you travel or work in places where you may be exposed to hepatitis B. Haemophilus influenzae type b (Hib) vaccine  You may need this if you have certain conditions. Human papillomavirus (HPV) vaccine  If recommended by your health care provider, you may need three doses over 6 months. You may receive vaccines as individual doses or as more than one vaccine together in one shot (combination vaccines). Talk with  your health care provider about the risks and benefits of combination vaccines. What tests do I need? Blood tests  Lipid and cholesterol levels. These may be checked every 5 years, or more frequently if you are over 82 years old.  Hepatitis C test.  Hepatitis B test. Screening  Lung cancer screening. You may have this screening every year starting at age 17 if you have a 30-pack-year history of smoking and currently smoke or have quit within the past 15 years.  Colorectal cancer screening. All adults should have this screening starting at age 69 and continuing until age 8. Your health care provider may recommend screening at age 70 if you are at increased risk. You will have tests every 1-10 years, depending on your results and the type of screening test.  Diabetes screening. This is done by checking your blood sugar (glucose) after you have not eaten for a while (fasting). You may have this done every 1-3 years.  Mammogram. This may be done every 1-2 years. Talk with your health care provider about when you should start having regular mammograms. This may depend on whether you have a family history of breast cancer.  BRCA-related cancer screening. This may be done if you have a family history of breast, ovarian, tubal, or peritoneal cancers.  Pelvic exam and Pap test. This may be done every 3 years starting at age 82. Starting at age 35, this may be done every 5 years if you have a Pap test in combination with an HPV test. Other tests  Sexually transmitted disease (STD) testing.  Bone density scan. This is done to screen for osteoporosis. You may have this scan if you are at high risk for osteoporosis. Follow these instructions at home: Eating and drinking  Eat a diet that includes fresh fruits and vegetables, whole grains, lean protein, and low-fat dairy.  Take vitamin and mineral supplements as recommended by your health care provider.  Do not drink alcohol if: ? Your health  care provider tells you not to drink. ? You are pregnant, may be pregnant, or are planning to become pregnant.  If you drink alcohol: ? Limit how much you have to 0-1 drink a day. ? Be aware of how much alcohol is in your drink. In the [place] one drink equals one 12 oz bottle of beer (355 mL), one 5 oz glass of wine (148 mL), or one 1 oz glass of hard liquor (44 mL). Lifestyle  Take daily care of your teeth and gums.  Stay active. Exercise for at least 30 minutes on 5 or more days each week.  Do not use any products that contain nicotine or tobacco, such as cigarettes, e-cigarettes, and chewing tobacco. If you need help quitting, ask your health care provider.  If you are sexually active, practice safe sex. Use a condom or other form of birth control (contraception) in order to prevent pregnancy and STIs (sexually transmitted infections).  If told by your health care provider, take low-dose aspirin daily starting at age 59. What's  next?  Visit your health care provider once a year for a well check visit.  Ask your health care provider how often you should have your eyes and teeth checked.  Stay up to date on all vaccines. This information is not intended to replace advice given to you by your health care provider. Make sure you discuss any questions you have with your health care provider. Document Revised: 01/12/2018 Document Reviewed: 01/12/2018 Elsevier Patient Education  2020 [employer].

## 2019-07-26 ENCOUNTER — Other Ambulatory Visit: Payer: Self-pay

## 2019-07-27 ENCOUNTER — Other Ambulatory Visit: Payer: Self-pay

## 2019-07-27 ENCOUNTER — Ambulatory Visit (INDEPENDENT_AMBULATORY_CARE_PROVIDER_SITE_OTHER): Payer: Medicare Other | Admitting: Family

## 2019-07-27 ENCOUNTER — Encounter: Payer: Self-pay | Admitting: Family

## 2019-07-27 VITALS — BP 133/77 | HR 91 | Temp 97.0°F | Resp 16 | Ht 65.0 in | Wt 312.0 lb

## 2019-07-27 DIAGNOSIS — E559 Vitamin D deficiency, unspecified: Secondary | ICD-10-CM

## 2019-07-27 DIAGNOSIS — Z23 Encounter for immunization: Secondary | ICD-10-CM

## 2019-07-27 DIAGNOSIS — E2839 Other primary ovarian failure: Secondary | ICD-10-CM

## 2019-07-27 DIAGNOSIS — E119 Type 2 diabetes mellitus without complications: Secondary | ICD-10-CM

## 2019-07-27 DIAGNOSIS — Z0000 Encounter for general adult medical examination without abnormal findings: Secondary | ICD-10-CM | POA: Diagnosis not present

## 2019-07-27 DIAGNOSIS — I10 Essential (primary) hypertension: Secondary | ICD-10-CM | POA: Diagnosis not present

## 2019-07-27 DIAGNOSIS — E782 Mixed hyperlipidemia: Secondary | ICD-10-CM

## 2019-07-27 LAB — COMPREHENSIVE METABOLIC PANEL
ALT: 18 U/L (ref 0–35)
AST: 18 U/L (ref 0–37)
Albumin: 3.7 g/dL (ref 3.5–5.2)
Alkaline Phosphatase: 70 U/L (ref 39–117)
BUN: 19 mg/dL (ref 6–23)
CO2: 31 mEq/L (ref 19–32)
Calcium: 9.2 mg/dL (ref 8.4–10.5)
Chloride: 103 mEq/L (ref 96–112)
Creatinine, Ser: 0.97 mg/dL (ref 0.40–1.20)
GFR: 70.45 mL/min (ref >60.00)
Glucose, Bld: 108 mg/dL — ABNORMAL HIGH (ref 70–99)
Potassium: 3.5 mEq/L (ref 3.5–5.1)
Sodium: 141 mEq/L (ref 135–145)
Total Bilirubin: 0.3 mg/dL (ref 0.2–1.2)
Total Protein: 7.1 g/dL (ref 6.0–8.3)

## 2019-07-27 LAB — LIPID PANEL
Cholesterol: 165 mg/dL (ref 0–200)
HDL: 46.4 mg/dL (ref >39.00)
LDL Cholesterol: 102 mg/dL — ABNORMAL HIGH (ref 0–99)
NonHDL: 118.62
Total CHOL/HDL Ratio: 4
Triglycerides: 85 mg/dL (ref 0.0–149.0)
VLDL: 17 mg/dL (ref 0.0–40.0)

## 2019-07-27 LAB — HEMOGLOBIN A1C: Hgb A1c MFr Bld: 6.6 % — ABNORMAL HIGH (ref 4.6–6.5)

## 2019-07-27 MED ORDER — TETANUS-DIPHTH-ACELL PERTUSSIS 5-2.5-18.5 LF-MCG/0.5 IM SUSP: 0.5000 mL | Freq: Once | INTRAMUSCULAR | 0 refills | Status: AC

## 2019-07-27 MED ORDER — SHINGRIX 50 MCG/0.5ML IM SUSR: INTRAMUSCULAR | 1 refills | Status: DC

## 2019-07-27 NOTE — Progress Notes (Signed)
Subjective:    Patient ID: Leah Hampton, female    DOB: 04-13-1956, 64 y.o.   MRN: [id number]  HPI  Patient presents today for complete physical.  Immunizations: due for pneumovax booster.  Diet:   Breakfast: bacon/egg sandwich on toast  Lunch: sometimes skips lunch  Dinner- eating baked foods, not fried.  Last night she had bbq chicken, green beans and lomein noodles Snacks:  Will often snack after meals, chips/candy/cookies  Drink:  Water, rare soda, sparkling water    Wt Readings from Last 3 Encounters:  07/27/19 (!) 312 lb (141.5 kg)  07/10/19 (!) 312 lb 12.8 oz (141.9 kg)  06/11/19 (!) 313 lb (142 kg)  Exercise: some walking Colonoscopy: 2016 Dexa: due Pap Smear: 202 Mammogram: 11/24/18 Dental: due Vision: due  DM eye exam Lab Results  Component Value Date   HGBA1C 6.5 05/08/2019   HGBA1C 6.5 12/28/2018   HGBA1C 6.6 (H) 05/16/2018   Lab Results  Component Value Date   MICROALBUR 0.97 07/13/2011   [place] 121 (H) 05/08/2019   CREATININE 1.03 05/08/2019   HTN- maintained on hyzaar  Hyperlipidemia- maintained on pravachol  Review of Systems  Constitutional: Negative for unexpected weight change.  HENT: Negative for hearing loss and rhinorrhea.   Eyes: Negative for visual disturbance.  Respiratory: Negative for cough and shortness of breath.   Cardiovascular: Negative for chest pain.  Gastrointestinal: Negative for constipation and diarrhea.  Genitourinary: Negative for dysuria, frequency and hematuria.  Musculoskeletal: Positive for arthralgias (right knee pain). Negative for myalgias.  Skin: Negative for rash.  Neurological: Negative for headaches.  Hematological: Negative for adenopathy.  Psychiatric/Behavioral:       Denies depression/anxiety   Past Medical History:  Diagnosis Date  . Bronchitis   . Cystitis   . Diabetes type 2, controlled ([place])   . Hematuria    urologice eval, Dr Estill Dooms  . History of chest pain   . Hyperbilirubinemia    . Hyperlipemia   . Hypertelorism   . Hypertension   . Leukocytosis   . Obesity   . Osteoarthritis    right knee  . Sleep apnea 2012   slight   . Vaginal Pap smear, abnormal   . Vitamin D deficiency 02/09/2018     Social History   Socioeconomic History  . Marital status: Single    Spouse name: Not on file  . Number of children: 2  . Years of education: Not on file  . Highest education level: Not on file  Occupational History  . Occupation: disabled  Tobacco Use  . Smoking status: Never Smoker  . Smokeless tobacco: Never Used  Substance and Sexual Activity  . Alcohol use: No  . Drug use: No  . Sexual activity: Not Currently  Other Topics Concern  . Not on file  Social History Narrative   Regular exercise:  No      Lives alone   Has 2 grown sons, both local.  1 grandson   On disability due to her knee pain   Completed 12th grade   Enjoys television   No pets.    Social Determinants of Health   Financial Resource Strain: Low Risk   . Difficulty of Paying Living Expenses: Not hard at all  Food Insecurity: No Food Insecurity  . Worried About Charity fundraiser in the Last Year: Never true  . Ran Out of Food in the Last Year: Never true  Transportation Needs: No Transportation Needs  . Lack of  Transportation (Medical): No  . Lack of Transportation (Non-Medical): No  Physical Activity:   . Days of Exercise per Week:   . Minutes of Exercise per Session:   Stress:   . Feeling of Stress :   Social Connections:   . Frequency of Communication with Friends and Family:   . Frequency of Social Gatherings with Friends and Family:   . Attends Religious Services:   . Active Member of Clubs or Organizations:   . Attends Archivist Meetings:   Marland Kitchen Marital Status:   Intimate Partner Violence:   . Fear of Current or Ex-Partner:   . Emotionally Abused:   Marland Kitchen Physically Abused:   . Sexually Abused:     Past Surgical History:  Procedure Laterality Date  .  COLONOSCOPY WITH PROPOFOL N/A 03/27/2015   Procedure: COLONOSCOPY WITH PROPOFOL;  Surgeon: Milus Banister, MD;  Location: [facility];  Service: Endoscopy;  Laterality: N/A;  . DILATATION & CURETTAGE/HYSTEROSCOPY WITH MYOSURE N/A 01/05/2017   Procedure: DILATATION & CURETTAGE/HYSTEROSCOPY WITH MYOSURE;  Surgeon: Thurnell Lose, MD;  Location: [facility];  Service: Gynecology;  Laterality: N/A;  PostMenopausal Bleeding  . HYSTEROSCOPY WITH D & C N/A 05/22/2019   Procedure: DILATATION AND CURETTAGE /HYSTEROSCOPY, POLYPECTOMY WITH MYOSURE;  Surgeon: Lavonia Drafts, MD;  Location: [facility];  Service: Gynecology;  Laterality: N/A;  . KNEE ARTHROSCOPY  04/2007   left knee  . TOTAL KNEE ARTHROPLASTY  10/2008   Dr Roxine Caddy    Family History  Problem Relation Age of Onset  . Heart failure Mother   . Arthritis Mother   . Hypertension Mother   . Stroke Mother   . Heart attack Father 75  . Diabetes Other   . Arthritis Other   . Stroke Other   . Coronary artery disease Other   . Heart failure Sister        x 2  . Colon cancer Neg Hx     Allergies  Allergen Reactions  . Sulfamethoxazole-Trimethoprim [Bactrim] Itching  . Codeine Nausea And Vomiting and Anxiety    Current Outpatient Medications on File Prior to Visit  Medication Sig Dispense Refill  . b complex vitamins tablet Take 1 tablet by mouth daily.    . blood glucose meter kit and supplies KIT Dispense based on patient and insurance preference. Use up to four times daily as directed. (FOR ICD-9 250.00, 250.01). 1 each 0  . Cholecalciferol (VITAMIN D3) 75 MCG (3000 UT) TABS Take 1 tablet by mouth daily. 30 tablet 0  . clotrimazole-betamethasone (LOTRISONE) cream APPLY TOPICALLY TWICE DAILY 45 g 0  . FLOVENT HFA 110 MCG/ACT inhaler INHALE 2 PUFFS TWICE DAILY 36 g 1  . ibuprofen (ADVIL) 600 MG tablet Take 1 tablet (600 mg total) by mouth every 6 (six) hours as needed for moderate pain or cramping. 20 tablet 0  .  losartan-hydrochlorothiazide (HYZAAR) 100-25 MG tablet Take 1 tablet by mouth daily. 90 tablet 1  . meloxicam (MOBIC) 7.5 MG tablet Take 1 tablet (7.5 mg total) by mouth daily as needed for pain. 30 tablet 1  . omeprazole (PRILOSEC) 20 MG capsule TAKE 1 CAPSULE EVERY DAY (Patient taking differently: Take 20 mg by mouth daily before breakfast. ) 90 capsule 1  . potassium chloride SA (K-DUR) 20 MEQ tablet Take 1 tablet (20 mEq total) by mouth daily. 90 tablet 1  . pravastatin (PRAVACHOL) 80 MG tablet Take 1 tablet (80 mg total) by mouth daily. (Patient taking differently: Take 80 mg by mouth  every evening. ) 90 tablet 1  . valACYclovir (VALTREX) 1000 MG tablet TAKE 1 TABLET EVERY DAY (Patient taking differently: Take 1,000 mg by mouth daily. ) 90 tablet 1   No current facility-administered medications on file prior to visit.    BP 133/77 (BP Location: Right Arm, Patient Position: Sitting, Cuff Size: Large)   Pulse 91   Temp (!) 97 F (36.1 C) (Temporal)   Resp 16   Ht _0  (1.651 m)   Wt (!) 312 lb (141.5 kg)   LMP 11/10/2011   SpO2 97%   BMI 51.92 kg/m       Objective:   Physical Exam  Physical Exam  Constitutional: She is oriented to person, place, and time. She appears morbidly obese. No distress.  HENT:  Head: Normocephalic and atraumatic.  Right Ear: Tympanic membrane and ear canal normal.  Left Ear: Tympanic membrane and ear canal normal.  Mouth/Throat: Not examined- pt wearing mask Eyes: Pupils are equal, round, and reactive to light. No scleral icterus.  Neck: Normal range of motion. No thyromegaly present.  Cardiovascular: Normal rate and regular rhythm.   No murmur heard. Pulmonary/Chest: Effort normal and breath sounds normal. No respiratory distress. He has no wheezes. She has no rales. She exhibits no tenderness.  Abdominal: Soft. Bowel sounds are normal. She exhibits no distension and no mass. There is no tenderness. There is no rebound and no guarding.    Musculoskeletal: She exhibits no edema.  Lymphadenopathy:    She has no cervical adenopathy.  Neurological: She is alert and oriented to person, place, and time. She has normal patellar reflexes. She exhibits normal muscle tone. Coordination normal.  Skin: Skin is warm and dry.  Psychiatric: She has a normal mood and affect. Her behavior is normal. Judgment and thought content normal.  Breasts: Examined lying Right: Without masses, retractions, discharge or axillary adenopathy.  Left: Without masses, retractions, discharge or axillary adenopathy.  Pelvic: not examined     Assessment & Plan:   Preventative care- discussed healthy diet, exercise and weight loss.  Mammogram and pap up to date. Due for pneumovax booster. She plans to obtain shingrix and tetanus at her pharmacy. Reminded her to wait 14 days after each immunization before she receives the covid vaccine. Obtain baseline dexa.   DM2- diet needs improvement- refer to nutrition for help with diet/weight loss.  Obtain a1C.  Vitamin D deficiency- check follow up vit D level. Advised pt to decrease vit D from 5000 iu to 3000 iu.   Hyperlipidemia- obtain follow up lipid panel, continue pravastatin.  HTN- bp stable, continue current meds.    This visit occurred during the SARS-CoV-2 public health emergency.  Safety protocols were in place, including screening questions prior to the visit, additional usage of staff PPE, and extensive cleaning of exam room while observing appropriate contact time as indicated for disinfecting solutions.    Assessment & Plan:

## 2019-07-27 NOTE — Patient Instructions (Addendum)
Please schedule your routine eye exam and dental exam. You should be scheduled about your appointment with nutrition.   Change your vitamin D from 5000 iu once daily to 3000 iu once daily.

## 2019-07-31 ENCOUNTER — Telehealth: Payer: Self-pay | Admitting: Family

## 2019-07-31 LAB — VITAMIN D 1,25 DIHYDROXY
Vitamin D 1, 25 (OH)2 Total: 75 pg/mL — ABNORMAL HIGH (ref 18–72)
Vitamin D2 1, 25 (OH)2: <8 pg/mL
Vitamin D3 1, 25 (OH)2: 75 pg/mL

## 2019-07-31 NOTE — Telephone Encounter (Signed)
Please advise pt that her vitamin D level is a bit high. Is she taking a vit D supplement? If so, please d/c and repeat vit D level in 3 months. Dx vitamin D deficiency.  Sugar is at goal.   Cholesterol is improving.

## 2019-08-02 ENCOUNTER — Other Ambulatory Visit: Payer: Self-pay

## 2019-08-02 ENCOUNTER — Telehealth: Payer: Self-pay | Admitting: Family

## 2019-08-02 DIAGNOSIS — I10 Essential (primary) hypertension: Secondary | ICD-10-CM

## 2019-08-02 MED ORDER — VALACYCLOVIR HCL 1 G PO TABS: 1000.0000 mg | ORAL_TABLET | Freq: Every day | ORAL | 1 refills | Status: DC

## 2019-08-02 MED ORDER — FLOVENT HFA 110 MCG/ACT IN AERO: 2.0000 | INHALATION_SPRAY | Freq: Two times a day (BID) | RESPIRATORY_TRACT | 1 refills | Status: DC

## 2019-08-02 MED ORDER — POTASSIUM CHLORIDE CRYS ER 20 MEQ PO TBCR: 20.0000 meq | EXTENDED_RELEASE_TABLET | Freq: Every day | ORAL | 1 refills | Status: DC

## 2019-08-02 NOTE — Telephone Encounter (Signed)
Medication: valACYclovir (VALTREX) 1000 MG tablet  potassium chloride SA (K-DUR) 20 MEQ tablet FLOVENT HFA 110 MCG/ACT inhaler     Has the patient contacted their pharmacy? No. (If no, request that the patient contact the pharmacy for the refill.) (If yes, when and what did the pharmacy advise?)  Preferred Pharmacy (with phone number or street name): [facility], [place] [employer] Phone:  [phone]  Fax:  [phone]     Agent: Please be advised that RX refills may take up to 3 business days. We ask that you follow-up with your pharmacy.

## 2019-08-02 NOTE — Telephone Encounter (Signed)
Refills sent to pharmacy as requested.

## 2019-08-02 NOTE — Telephone Encounter (Signed)
Patient advised of results and provider's advise. She will follow up in June.

## 2019-08-07 ENCOUNTER — Ambulatory Visit: Payer: Medicare HMO | Admitting: Family

## 2019-08-17 ENCOUNTER — Ambulatory Visit: Payer: Medicare Other | Attending: Internal Medicine

## 2019-08-17 ENCOUNTER — Other Ambulatory Visit (HOSPITAL_BASED_OUTPATIENT_CLINIC_OR_DEPARTMENT_OTHER): Payer: Medicare Other

## 2019-08-17 DIAGNOSIS — Z23 Encounter for immunization: Secondary | ICD-10-CM

## 2019-08-17 NOTE — Progress Notes (Signed)
   Covid-19 Vaccination Clinic  Name:  Leah Hampton    MRN: [id number] DOB: 05-01-1956  08/17/2019  Ms. [name] was observed post Covid-19 immunization for 15 minutes without incident. She was provided with Vaccine Information Sheet and instruction to access the V-Safe system.   Ms. [name] was instructed to call 911 with any severe reactions post vaccine: Marland Kitchen Difficulty breathing  . Swelling of face and throat  . A fast heartbeat  . A bad rash all over body  . Dizziness and weakness   Immunizations Administered    Name Date Dose VIS Date Route   Pfizer COVID-19 Vaccine 08/17/2019 11:33 AM 0.3 mL 04/27/2019 Intramuscular   Manufacturer: [place]   Lot: [id number]   [place]: [id number]

## 2019-08-19 NOTE — Progress Notes (Signed)
08/20/2019 Leah Hampton [id number] Dec 14, 1955   CHIEF COMPLAINT:  Abdominal pain  HISTORY OF PRESENT ILLNESS:  Leah Hampton is a 64 year old female with a past medical history of arthritis, hypertension, hyperlipidemia and colon polyps. S/P D & C 05/22/2019. She presents today for further evaluation regarding epigastric, central abdomina pain and she sometimes has pain to her right and left sides. She is mostly concerned regarding her upper and central abdominal pain which started 2 to 3 weeks ago and is worse after eating dinner. No specific food triggers. She has difficulty describing her abdominal pain, states "it just hurts".  No nausea or vomiting.  No fever, sweats or chills. No weight loss. She intends to lose weight by joining the "[employer]" weight loss program. Her abdominal pain does not awaken her from sleep. She is passing a normal solid stool 2 to 3 times daily. She takes a fiber supplement daily. History of colon polyps in 2011. Her most recent colonoscopy in 2016 was normal, no polyps. She is taking  Ibuprofen 67m for knees once or twice monthly for knee pain. She infrequently takes Meloxicam 7.552mdaily for knee pain, last took possibly one month ago. She took Prilosec 2083mD for a few weeks without improvement. Increased stress secondary to grieving for the loss of family's young infant.   CBC Latest Ref Rng & Units 08/20/2019 05/21/2019 03/02/2019  WBC 4.0 - 10.5 K/uL 9.5 8.4 9.3  Hemoglobin 12.0 - 15.0 g/dL 12.8 12.0 12.4  Hematocrit 36.0 - 46.0 % 38.7 38.3 37.6  Platelets 150.0 - 400.0 K/uL 308.0 286 305.0   CMP Latest Ref Rng & Units 08/20/2019 07/27/2019 05/08/2019  Glucose 70 - 99 mg/dL 111(H) 108(H) 112(H)  BUN 6 - 23 mg/dL 18 19 18   Creatinine 0.40 - 1.20 mg/dL 0.95 0.97 1.03  Sodium 135 - 145 mEq/L 140 141 139  Potassium 3.5 - 5.1 mEq/L 3.3(L) 3.5 3.5  Chloride 96 - 112 mEq/L 102 103 103  CO2 19 - 32 mEq/L 31 31 29   Calcium 8.4 - 10.5 mg/dL 9.6 9.2 9.4  Total  Protein 6.0 - 8.3 g/dL 7.8 7.1 7.4  Total Bilirubin 0.2 - 1.2 mg/dL 0.4 0.3 0.4  Alkaline Phos 39 - 117 U/L 71 70 59  AST 0 - 37 U/L 19 18 26   ALT 0 - 35 U/L 18 18 19     Colonoscopy 03/27/2015 by Dr. JacArdis Hughsormal. No polyps.  Recall colonoscopy 5 years due to history of tubular adenomatous colon polyps in 2011.   Colonoscopy 07/02/2009: 3 polyps were removed from the ascending, descending and sigmoid colon, one polyp was > 1 cm, two were retrieved and sent to pathology. Otherwise normal exam.  1. COLON, POLYP(S), ASCENDING, DESCENDING :  - TUBULAR ADENOMA.  - polypoid colorectal mucosa with reactive lymphoid aggregates.  - high grade dysplasia is not identified.    Past Medical History:  Diagnosis Date  . Bronchitis   . Cystitis   . [place] Hematuria    urologice eval, Dr EskEstill Dooms History of chest pain   . Hyperbilirubinemia   . Hyperlipemia   . Hypertelorism   . Hypertension   . Leukocytosis   . Obesity   . Osteoarthritis    right knee  . Sleep apnea 2012   slight   . Vaginal Pap smear, abnormal   . Vitamin D deficiency 02/09/2018   Past Surgical History:  Procedure Laterality  Date  . COLONOSCOPY WITH PROPOFOL N/A 03/27/2015   Procedure: COLONOSCOPY WITH PROPOFOL;  Surgeon: Milus Banister, MD;  [facility];  Service: Endoscopy;  Laterality: N/A;  . DILATATION & CURETTAGE/HYSTEROSCOPY WITH MYOSURE N/A 01/05/2017   Procedure: DILATATION & CURETTAGE/HYSTEROSCOPY WITH MYOSURE;  Surgeon: Thurnell Lose, MD;  Location: [facility];  Service: Gynecology;  Laterality: N/A;  PostMenopausal Bleeding  . HYSTEROSCOPY WITH D & C N/A 05/22/2019   Procedure: DILATATION AND CURETTAGE /HYSTEROSCOPY, POLYPECTOMY WITH MYOSURE;  Surgeon: Lavonia Drafts, MD;  Location: [facility];  Service: Gynecology;  Laterality: N/A;  . KNEE ARTHROSCOPY  04/2007   left knee  . TOTAL KNEE ARTHROPLASTY  10/2008   Dr Roxine Caddy     reports that she has never  smoked. She has never used smokeless tobacco. She reports that she does not drink alcohol or use drugs. family history includes Alcohol abuse in her brother; Arthritis in her mother and another family member; CAD (age of onset: 37) in her sister; CVA in her sister; Cirrhosis in her brother; Coronary artery disease in an other family member; Diabetes in an other family member; Diabetes Mellitus II in her mother and sister; Heart attack (age of onset: 25) in her father; Heart failure in her mother; Hypertension in her mother and sister; Stroke in her mother and another family member. Allergies  Allergen Reactions  . Sulfamethoxazole-Trimethoprim [Bactrim] Itching  . Codeine Nausea And Vomiting and Anxiety      Outpatient Encounter Medications as of 08/20/2019  Medication Sig  . blood glucose meter kit and supplies KIT Dispense based on patient and insurance preference. Use up to four times daily as directed. (FOR ICD-9 250.00, 250.01).  . clotrimazole-betamethasone (LOTRISONE) cream APPLY TOPICALLY TWICE DAILY  . fluticasone (FLOVENT HFA) 110 MCG/ACT inhaler Inhale 2 puffs into the lungs 2 (two) times daily.  Marland Kitchen ibuprofen (ADVIL) 600 MG tablet Take 1 tablet (600 mg total) by mouth every 6 (six) hours as needed for moderate pain or cramping.  Marland Kitchen losartan-hydrochlorothiazide (HYZAAR) 100-25 MG tablet Take 1 tablet by mouth daily.  . meloxicam (MOBIC) 7.5 MG tablet Take 1 tablet (7.5 mg total) by mouth daily as needed for pain.  . potassium chloride SA (KLOR-CON) 20 MEQ tablet Take 1 tablet (20 mEq total) by mouth daily.  . pravastatin (PRAVACHOL) 80 MG tablet Take 1 tablet (80 mg total) by mouth daily. (Patient taking differently: Take 80 mg by mouth every evening. )  . valACYclovir (VALTREX) 1000 MG tablet Take 1 tablet (1,000 mg total) by mouth daily.  Marland Kitchen Zoster Vaccine Adjuvanted [facility]) injection Inject 0.5m IM now and again in 2-6 months.  . dicyclomine (BENTYL) 10 MG capsule Every 8 hours as  needed for abdominal pain  . famotidine (PEPCID) 20 MG tablet Take 1 tablet (20 mg total) by mouth daily.  . [DISCONTINUED] b complex vitamins tablet Take 1 tablet by mouth daily.  . [DISCONTINUED] omeprazole (PRILOSEC) 20 MG capsule TAKE 1 CAPSULE EVERY DAY (Patient taking differently: Take 20 mg by mouth daily before breakfast. )   No facility-administered encounter medications on file as of 08/20/2019.     REVIEW OF SYSTEMS: All other systems reviewed and negative except where noted in the History of Present Illness.   PHYSICAL EXAM: BP 108/64   Pulse 80   Temp 98.2 F (36.8 C)   Ht 5' 5"  (1.651 m)   Wt (!) 312 lb 9.6 oz (141.8 kg)   LMP 11/10/2011   BMI 52.02 kg/m  General:  Obese 63 year old female in no acute distress. Head: Normocephalic and atraumatic. Eyes:  Sclerae non-icteric, conjunctive pink. Ears: Normal auditory acuity. Mouth:  No ulcers or lesions.  Neck: Supple, no lymphadenopathy or thyromegaly.  Lungs: Clear bilaterally to auscultation without wheezes, crackles or rhonchi. Heart: Regular rate and rhythm. No murmur, rub or gallop appreciated.  Abdomen: Soft, nondistended. Mild tenderness epigastric, periumbilical with moderate tenderness right of umbilicus with firm area without a discrete mass. No hepatosplenomegaly. Normoactive bowel sounds x 4 quadrants.  Rectal: Deferred.  Musculoskeletal: Symmetrical with no gross deformities. Skin: Warm and dry. No rash or lesions on visible extremities. Extremities: No edema. Neurological: Alert oriented x 4, no focal deficits.  Psychological:  Alert and cooperative. Normal mood and affect.  ASSESSMENT AND PLAN:  2. 64 year old female with upper abdominal pain, periumbilical pain.  -CBC, CMP, CRP -Abdominal/pelvic CT with contrast -Schedule EGD after Labs and CTAP results received -Famotidine 22m po daily -Dicyclomine 114mpo tid PRN abdominal pain -Further follow up to be determined after the above evaluation  completed -Patient to call our office if symptoms worsen   2. History of colon polyps -Next colonoscopy due Nov. 2021  3. HTN, well controlled    CC:  O'Debbrah AlarNP

## 2019-08-20 ENCOUNTER — Other Ambulatory Visit (INDEPENDENT_AMBULATORY_CARE_PROVIDER_SITE_OTHER): Payer: Medicare Other

## 2019-08-20 ENCOUNTER — Ambulatory Visit (INDEPENDENT_AMBULATORY_CARE_PROVIDER_SITE_OTHER): Payer: Medicare Other | Admitting: Nurse Practitioner

## 2019-08-20 ENCOUNTER — Encounter: Payer: Self-pay | Admitting: Nurse Practitioner

## 2019-08-20 VITALS — BP 108/64 | HR 80 | Temp 98.2°F | Ht 65.0 in | Wt 312.6 lb

## 2019-08-20 DIAGNOSIS — R1013 Epigastric pain: Secondary | ICD-10-CM

## 2019-08-20 DIAGNOSIS — R1010 Upper abdominal pain, unspecified: Secondary | ICD-10-CM | POA: Diagnosis not present

## 2019-08-20 DIAGNOSIS — R1033 Periumbilical pain: Secondary | ICD-10-CM

## 2019-08-20 LAB — CBC WITH DIFFERENTIAL/PLATELET
Basophils Absolute: 0 10*3/uL (ref 0.0–0.1)
Basophils Relative: 0.3 % (ref 0.0–3.0)
Eosinophils Absolute: 0.2 10*3/uL (ref 0.0–0.7)
Eosinophils Relative: 1.6 % (ref 0.0–5.0)
HCT: 38.7 % (ref 36.0–46.0)
Hemoglobin: 12.8 g/dL (ref 12.0–15.0)
Lymphocytes Relative: 39.5 % (ref 12.0–46.0)
Lymphs Abs: 3.8 10*3/uL (ref 0.7–4.0)
MCHC: 32.9 g/dL (ref 30.0–36.0)
MCV: 87.2 fl (ref 78.0–100.0)
Monocytes Absolute: 0.7 10*3/uL (ref 0.1–1.0)
Monocytes Relative: 7.1 % (ref 3.0–12.0)
Neutro Abs: 4.9 10*3/uL (ref 1.4–7.7)
Neutrophils Relative %: 51.5 % (ref 43.0–77.0)
Platelets: 308 10*3/uL (ref 150.0–400.0)
RBC: 4.44 Mil/uL (ref 3.87–5.11)
RDW: 16.1 % — ABNORMAL HIGH (ref 11.5–15.5)
WBC: 9.5 10*3/uL (ref 4.0–10.5)

## 2019-08-20 LAB — COMPREHENSIVE METABOLIC PANEL
ALT: 18 U/L (ref 0–35)
AST: 19 U/L (ref 0–37)
Albumin: 4 g/dL (ref 3.5–5.2)
Alkaline Phosphatase: 71 U/L (ref 39–117)
BUN: 18 mg/dL (ref 6–23)
CO2: 31 mEq/L (ref 19–32)
Calcium: 9.6 mg/dL (ref 8.4–10.5)
Chloride: 102 mEq/L (ref 96–112)
Creatinine, Ser: 0.95 mg/dL (ref 0.40–1.20)
GFR: 72.15 mL/min (ref >60.00)
Glucose, Bld: 111 mg/dL — ABNORMAL HIGH (ref 70–99)
Potassium: 3.3 mEq/L — ABNORMAL LOW (ref 3.5–5.1)
Sodium: 140 mEq/L (ref 135–145)
Total Bilirubin: 0.4 mg/dL (ref 0.2–1.2)
Total Protein: 7.8 g/dL (ref 6.0–8.3)

## 2019-08-20 LAB — C-REACTIVE PROTEIN: CRP: 1.7 mg/dL (ref 0.5–20.0)

## 2019-08-20 MED ORDER — FAMOTIDINE 20 MG PO TABS: 20.0000 mg | ORAL_TABLET | Freq: Every day | ORAL | 1 refills | Status: DC

## 2019-08-20 MED ORDER — DICYCLOMINE HCL 10 MG PO CAPS: ORAL_CAPSULE | ORAL | 0 refills | Status: DC

## 2019-08-20 NOTE — Patient Instructions (Signed)
If you are age 62 or older, your body mass index should be between 23-30. Your Body mass index is 52.02 kg/m. If this is out of the aforementioned range listed, please consider follow up with your Primary Care Provider.  If you are age 67 or younger, your body mass index should be between 19-25. Your Body mass index is 52.02 kg/m. If this is out of the aformentioned range listed, please consider follow up with your Primary Care Provider.   ___________________________________________________________  Leah Hampton have been scheduled for a CT scan of the abdomen and pelvis at [facility], 1st floor Radiology.  You are scheduled on 08/27/2019 at 8:00am. You should arrive 15 minutes prior to your appointment time for registration.   Please go to [facility] Radiology at least 3 days prior to your procedure to pick up instructions and contrast for the exam.  WARNING: IF YOU ARE ALLERGIC TO IODINE/X-RAY DYE, PLEASE NOTIFY RADIOLOGY IMMEDIATELY AT [phone]! YOU WILL BE GIVEN A 13 HOUR PREMEDICATION PREP.   If you have any questions regarding your exam or if you need to reschedule, you may call [phone] between the hours of 8:00 am and 5:00 pm, Monday-Friday.  ________________________________________________________________________   We have sent the following medications to your pharmacy for you to pick up at your convenience:  Famotadine 20 mg 1 tablet daily Dicyclomine 10mg  1 tablet every 8 hours as needed for abdominal pain  Your provider has requested that you go to the basement level for lab work before leaving today. Press "B" on the elevator. The lab is located at the first door on the left as you exit the elevator.  We have discussed scheduling an Endoscopy if your testing comes back negative.  Call the office if your symptoms worsen. [phone]  Due to recent changes in healthcare laws, you may see the results of your imaging and laboratory studies on MyChart before your  provider has had a chance to review them.  We understand that in some cases there may be results that are confusing or concerning to you. Not all laboratory results come back in the same time frame and the provider may be waiting for multiple results in order to interpret others.  Please give [place] 48 hours in order for your provider to thoroughly review all the results before contacting the office for clarification of your results.

## 2019-08-20 NOTE — Progress Notes (Signed)
I agree with the above note, plan 

## 2019-08-21 DIAGNOSIS — E782 Mixed hyperlipidemia: Secondary | ICD-10-CM | POA: Diagnosis not present

## 2019-08-21 DIAGNOSIS — R5383 Other fatigue: Secondary | ICD-10-CM | POA: Diagnosis not present

## 2019-08-23 ENCOUNTER — Other Ambulatory Visit: Payer: Self-pay

## 2019-08-23 ENCOUNTER — Encounter (HOSPITAL_COMMUNITY): Payer: Self-pay

## 2019-08-23 ENCOUNTER — Emergency Department (HOSPITAL_COMMUNITY): Payer: Medicare Other

## 2019-08-23 ENCOUNTER — Telehealth: Payer: Self-pay | Admitting: Nurse Practitioner

## 2019-08-23 ENCOUNTER — Emergency Department (HOSPITAL_COMMUNITY)
Admission: EM | Admit: 2019-08-23 | Discharge: 2019-08-23 | Disposition: A | Discharge status: Home / Self Care | Payer: Medicare Other | Attending: Emergency Medicine | Admitting: Emergency Medicine

## 2019-08-23 DIAGNOSIS — K8020 Calculus of gallbladder without cholecystitis without obstruction: Secondary | ICD-10-CM

## 2019-08-23 DIAGNOSIS — E119 Type 2 diabetes mellitus without complications: Secondary | ICD-10-CM | POA: Diagnosis not present

## 2019-08-23 DIAGNOSIS — I700 Atherosclerosis of aorta: Secondary | ICD-10-CM

## 2019-08-23 DIAGNOSIS — K439 Ventral hernia without obstruction or gangrene: Secondary | ICD-10-CM | POA: Diagnosis not present

## 2019-08-23 DIAGNOSIS — K429 Umbilical hernia without obstruction or gangrene: Secondary | ICD-10-CM | POA: Diagnosis not present

## 2019-08-23 DIAGNOSIS — Z79899 Other long term (current) drug therapy: Secondary | ICD-10-CM | POA: Diagnosis not present

## 2019-08-23 DIAGNOSIS — J449 Chronic obstructive pulmonary disease, unspecified: Secondary | ICD-10-CM | POA: Insufficient documentation

## 2019-08-23 DIAGNOSIS — I10 Essential (primary) hypertension: Secondary | ICD-10-CM | POA: Diagnosis not present

## 2019-08-23 DIAGNOSIS — R1033 Periumbilical pain: Secondary | ICD-10-CM | POA: Diagnosis present

## 2019-08-23 DIAGNOSIS — R109 Unspecified abdominal pain: Secondary | ICD-10-CM | POA: Diagnosis not present

## 2019-08-23 LAB — URINALYSIS, ROUTINE W REFLEX MICROSCOPIC
Bilirubin Urine: NEGATIVE
Glucose, UA: NEGATIVE mg/dL
Ketones, ur: NEGATIVE mg/dL
Nitrite: NEGATIVE
Protein, ur: NEGATIVE mg/dL
Specific Gravity, Urine: 1.016 (ref 1.005–1.030)
pH: 6 (ref 5.0–8.0)

## 2019-08-23 LAB — COMPREHENSIVE METABOLIC PANEL
ALT: 23 U/L (ref 0–44)
AST: 29 U/L (ref 15–41)
Albumin: 3.3 g/dL — ABNORMAL LOW (ref 3.5–5.0)
Alkaline Phosphatase: 63 U/L (ref 38–126)
Anion gap: 9 (ref 5–15)
BUN: 17 mg/dL (ref 8–23)
CO2: 29 mmol/L (ref 22–32)
Calcium: 9.4 mg/dL (ref 8.9–10.3)
Chloride: 104 mmol/L (ref 98–111)
Creatinine, Ser: 0.99 mg/dL (ref 0.44–1.00)
GFR calc Af Amer: >60 mL/min (ref >60)
GFR calc non Af Amer: >60 mL/min (ref >60)
Glucose, Bld: 119 mg/dL — ABNORMAL HIGH (ref 70–99)
Potassium: 4 mmol/L (ref 3.5–5.1)
Sodium: 142 mmol/L (ref 135–145)
Total Bilirubin: 0.4 mg/dL (ref 0.3–1.2)
Total Protein: 7.2 g/dL (ref 6.5–8.1)

## 2019-08-23 LAB — CBC
HCT: 38.8 % (ref 36.0–46.0)
Hemoglobin: 12.3 g/dL (ref 12.0–15.0)
MCH: 28.3 pg (ref 26.0–34.0)
MCHC: 31.7 g/dL (ref 30.0–36.0)
MCV: 89.4 fL (ref 80.0–100.0)
Platelets: 298 10*3/uL (ref 150–400)
RBC: 4.34 MIL/uL (ref 3.87–5.11)
RDW: 15.9 % — ABNORMAL HIGH (ref 11.5–15.5)
WBC: 9.1 10*3/uL (ref 4.0–10.5)
nRBC: 0 % (ref 0.0–0.2)

## 2019-08-23 LAB — LIPASE, BLOOD: Lipase: 18 U/L (ref 11–51)

## 2019-08-23 MED ORDER — SODIUM CHLORIDE 0.9% FLUSH
3.0000 mL | Freq: Once | INTRAVENOUS | Status: AC
  Administered 2019-08-23: 3 mL via INTRAVENOUS

## 2019-08-23 MED ORDER — LIDOCAINE VISCOUS HCL 2 % MT SOLN
15.0000 mL | Freq: Once | OROMUCOSAL | Status: AC
  Administered 2019-08-23: 15 mL via ORAL
  Filled 2019-08-23: qty 15

## 2019-08-23 MED ORDER — HYDROCODONE-ACETAMINOPHEN 5-325 MG PO TABS: 1.0000 | ORAL_TABLET | Freq: Four times a day (QID) | ORAL | 0 refills | Status: DC | PRN

## 2019-08-23 MED ORDER — FAMOTIDINE IN NACL 20-0.9 MG/50ML-% IV SOLN
20.0000 mg | Freq: Once | INTRAVENOUS | Status: AC
  Administered 2019-08-23: 15:00:00 20 mg via INTRAVENOUS
  Filled 2019-08-23: qty 50

## 2019-08-23 MED ORDER — MORPHINE SULFATE (PF) 4 MG/ML IV SOLN
4.0000 mg | Freq: Once | INTRAVENOUS | Status: AC
  Administered 2019-08-23: 4 mg via INTRAVENOUS
  Filled 2019-08-23: qty 1

## 2019-08-23 MED ORDER — PANTOPRAZOLE SODIUM 40 MG IV SOLR
40.0000 mg | Freq: Once | INTRAVENOUS | Status: AC
  Administered 2019-08-23: 40 mg via INTRAVENOUS
  Filled 2019-08-23: qty 40

## 2019-08-23 MED ORDER — IOHEXOL 300 MG/ML  SOLN
100.0000 mL | Freq: Once | INTRAMUSCULAR | Status: AC | PRN
  Administered 2019-08-23: 100 mL via INTRAVENOUS

## 2019-08-23 MED ORDER — ALUM & MAG HYDROXIDE-SIMETH 200-200-20 MG/5ML PO SUSP
30.0000 mL | Freq: Once | ORAL | Status: AC
  Administered 2019-08-23: 30 mL via ORAL
  Filled 2019-08-23: qty 30

## 2019-08-23 NOTE — Telephone Encounter (Signed)
Please advise pt that her potassium is a bit low per lab work done at GI.  I would like for her to take 2 tabs of Kdur today, then one tab once daily. Repeat bmet in 1 week, dx hypokalemia.

## 2019-08-23 NOTE — Telephone Encounter (Signed)
Noted patient is at [facility] ED, await ER evaluation labs/ CTAP.

## 2019-08-23 NOTE — ED Notes (Signed)
Verbalized understanding of DC instructions, Rx, follow up care with general surgery.

## 2019-08-23 NOTE — ED Notes (Signed)
Rx switched to different pharmacy so pt is able to pick up this evening.

## 2019-08-23 NOTE — Telephone Encounter (Signed)
Pt reported that her abd pain has worsened and that she will go to [facility] ER.

## 2019-08-23 NOTE — ED Provider Notes (Signed)
[facility] EMERGENCY DEPARTMENT Provider Note   CSN: [id number] Arrival date & time: 08/23/19  1032     History Chief Complaint  Patient presents with  . Abdominal Pain    Leah Hampton is a 64 y.o. female history diabetes, hyperlipidemia, hypertension, obesity, sleep apnea.  Patient reports 2-3 weeks of abdominal pain, periumbilical throbbing sensation constant worsened with palpation no clear inciting event, no alleviating factors, currently moderate in intensity.  Patient reports she was seen at her gastroenterologist for this problem on Monday, she was prescribed 2 medications which she has not filled yet and was scheduled for a CT abdomen pelvis this coming Monday, patient reports since symptoms did not improve she did not want to wait for the CT scan and came to the ER today.  Denies fever/chills, headache, chest pain/shortness of breath, vomiting, nausea, diarrhea, dysuria/hematuria, numbness/tingling, weakness, extremity swelling/color change or any additional concerns.  HPI     Past Medical History:  Diagnosis Date  . Bronchitis   . Cystitis   . Diabetes type 2, controlled ([place])   . Hematuria    urologice eval, Dr Estill Dooms  . History of chest pain   . Hyperbilirubinemia   . Hyperlipemia   . Hypertelorism   . Hypertension   . Leukocytosis   . Obesity   . Osteoarthritis    right knee  . Sleep apnea 2012   slight   . Vaginal Pap smear, abnormal   . Vitamin D deficiency 02/09/2018    Patient Active Problem List   Diagnosis Date Noted  . Upper abdominal pain 08/20/2019  . Right hip pain 05/28/2019  . Post-menopausal bleeding 05/16/2019  . Greater trochanteric pain syndrome of left lower extremity 03/20/2019  . Diabetes type 2, controlled ([place]) 05/18/2018  . Vitamin D deficiency 02/09/2018  . Onychomycosis 09/19/2017  . Tinea pedis of both feet 09/19/2017  . Obstructive chronic bronchitis with exacerbation ([place]) 05/23/2017  . Elevated  hemoglobin A1c 06/22/2016  . Genital herpes 05/23/2012  . Stress incontinence 03/04/2012  . Osteoarthritis of right knee 10/12/2011  . CTS (carpal tunnel syndrome) 07/13/2011  . OSA (obstructive sleep apnea) 12/10/2010  . Menorrhagia 10/07/2010  . BACK PAIN, LUMBAR 08/01/2009  . Morbid obesity, unspecified obesity type ([place]) 07/31/2008  . Hyperlipidemia 07/03/2008  . Essential hypertension 07/03/2008  . HYPERBILIRUBINEMIA 07/03/2008    Past Surgical History:  Procedure Laterality Date  . COLONOSCOPY WITH PROPOFOL N/A 03/27/2015   Procedure: COLONOSCOPY WITH PROPOFOL;  Surgeon: Milus Banister, MD;  Location: [facility];  Service: Endoscopy;  Laterality: N/A;  . DILATATION & CURETTAGE/HYSTEROSCOPY WITH MYOSURE N/A 01/05/2017   Procedure: DILATATION & CURETTAGE/HYSTEROSCOPY WITH MYOSURE;  Surgeon: Thurnell Lose, MD;  Location: [facility];  Service: Gynecology;  Laterality: N/A;  PostMenopausal Bleeding  . HYSTEROSCOPY WITH D & C N/A 05/22/2019   Procedure: DILATATION AND CURETTAGE /HYSTEROSCOPY, POLYPECTOMY WITH MYOSURE;  Surgeon: Lavonia Drafts, MD;  Location: [facility];  Service: Gynecology;  Laterality: N/A;  . KNEE ARTHROSCOPY  04/2007   left knee  . TOTAL KNEE ARTHROPLASTY  10/2008   Dr Roxine Caddy     OB History    Gravida  2   Para  2   Term  2   Preterm      AB      Living        SAB      TAB      Ectopic      Multiple      Live  Births  2           Family History  Problem Relation Age of Onset  . Heart failure Mother   . Arthritis Mother   . Hypertension Mother   . Stroke Mother   . Diabetes Mellitus II Mother   . Heart attack Father 17  . Diabetes Other   . Arthritis Other   . Stroke Other   . Coronary artery disease Other   . Hypertension Sister        x 2  . CVA Sister   . CAD Sister 90  . Diabetes Mellitus II Sister   . Cirrhosis Brother   . Alcohol abuse Brother   . Colon cancer Neg Hx     Social History   Tobacco Use  .  Smoking status: Never Smoker  . Smokeless tobacco: Never Used  Substance Use Topics  . Alcohol use: No  . Drug use: No    Home Medications Prior to Admission medications   Medication Sig Start Date End Date Taking? Authorizing Provider  albuterol (VENTOLIN HFA) 108 (90 Base) MCG/ACT inhaler Inhale 2 puffs into the lungs every 6 (six) hours as needed for wheezing or shortness of breath.   Yes [provider]  clotrimazole-betamethasone (LOTRISONE) cream APPLY TOPICALLY TWICE DAILY Patient taking differently: Apply 1 application topically daily as needed (for itching).  05/14/19  Yes Trula Slade, DPM  dicyclomine (BENTYL) 10 MG capsule Every 8 hours as needed for abdominal pain Patient taking differently: Take 10 mg by mouth 3 (three) times daily as needed for spasms. Every 8 hours as needed for abdominal pain 08/20/19  Yes Noralyn Pick, NP  fluticasone (FLOVENT HFA) 110 MCG/ACT inhaler Inhale 2 puffs into the lungs 2 (two) times daily. 08/02/19  Yes Debbrah Alar, NP  ibuprofen (ADVIL) 600 MG tablet Take 1 tablet (600 mg total) by mouth every 6 (six) hours as needed for moderate pain or cramping. 05/22/19  Yes Harraway-Smith, Hoyle Sauer, MD  losartan-hydrochlorothiazide (HYZAAR) 100-25 MG tablet Take 1 tablet by mouth daily. 07/02/19  Yes Debbrah Alar, NP  Multiple Vitamin (MULTIVITAMIN WITH MINERALS) TABS tablet Take 1 tablet by mouth daily. Vitafusion   Yes [provider]  potassium chloride SA (KLOR-CON) 20 MEQ tablet Take 1 tablet (20 mEq total) by mouth daily. 08/02/19  Yes Debbrah Alar, NP  pravastatin (PRAVACHOL) 80 MG tablet Take 1 tablet (80 mg total) by mouth daily. Patient taking differently: Take 80 mg by mouth every evening.  05/09/19  Yes Debbrah Alar, NP  psyllium (METAMUCIL) 58.6 % powder Take 1 packet by mouth daily.   Yes [provider]  valACYclovir (VALTREX) 1000 MG tablet Take 1 tablet (1,000 mg total) by  mouth daily. 08/02/19  Yes Debbrah Alar, NP  blood glucose meter kit and supplies KIT Dispense based on patient and insurance preference. Use up to four times daily as directed. (FOR ICD-9 250.00, 250.01). 10/18/18   Debbrah Alar, NP  famotidine (PEPCID) 20 MG tablet Take 1 tablet (20 mg total) by mouth daily. Patient not taking: Reported on 08/23/2019 08/20/19   Noralyn Pick, NP  HYDROcodone-acetaminophen (NORCO/VICODIN) 5-325 MG tablet Take 1-2 tablets by mouth every 6 (six) hours as needed. 08/23/19   Nuala Alpha A, PA-C  meloxicam (MOBIC) 7.5 MG tablet Take 1 tablet (7.5 mg total) by mouth daily as needed for pain. Patient not taking: Reported on 08/23/2019 05/08/19   Debbrah Alar, NP  Zoster Vaccine Adjuvanted [facility] injection Inject 0.59m IM  now and again in 2-6 months. 07/27/19   Debbrah Alar, NP    Allergies    Sulfamethoxazole-trimethoprim [bactrim] and Codeine  Review of Systems   Review of Systems Ten systems are reviewed and are negative for acute change except as noted in the HPI  Physical Exam Updated Vital Signs BP 123/76   Pulse 91   Temp 97.7 F (36.5 C) (Oral)   Resp 17   LMP 11/10/2011   SpO2 95%   Physical Exam Constitutional:      General: She is not in acute distress.    Appearance: Normal appearance. She is well-developed. She is obese. She is not ill-appearing or diaphoretic.  HENT:     Head: Normocephalic and atraumatic.     Right Ear: External ear normal.     Left Ear: External ear normal.     Nose: Nose normal.  Eyes:     General: Vision grossly intact. Gaze aligned appropriately.     Pupils: Pupils are equal, round, and reactive to light.  Neck:     Trachea: Trachea and phonation normal. No tracheal deviation.  Pulmonary:     Effort: Pulmonary effort is normal. No respiratory distress.  Abdominal:     General: There is no distension.     Palpations: Abdomen is soft.     Tenderness: There is abdominal  tenderness in the periumbilical area. There is no guarding or rebound.     Comments: No overlying skin changes  Musculoskeletal:        General: Normal range of motion.     Cervical back: Normal range of motion.  Skin:    General: Skin is warm and dry.  Neurological:     Mental Status: She is alert.     GCS: GCS eye subscore is 4. GCS verbal subscore is 5. GCS motor subscore is 6.     Comments: Speech is clear and goal oriented, follows commands Major Cranial nerves without deficit, no facial droop Moves extremities without ataxia, coordination intact  Psychiatric:        Behavior: Behavior normal.     ED Results / Procedures / Treatments   Labs (all labs ordered are listed, but only abnormal results are displayed) Labs Reviewed  COMPREHENSIVE METABOLIC PANEL - Abnormal; Notable for the following components:      Result Value   Glucose, Bld 119 (*)    Albumin 3.3 (*)    All other components within normal limits  CBC - Abnormal; Notable for the following components:   RDW 15.9 (*)    All other components within normal limits  URINALYSIS, ROUTINE W REFLEX MICROSCOPIC - Abnormal; Notable for the following components:   APPearance HAZY (*)    Hgb urine dipstick SMALL (*)    Leukocytes,Ua LARGE (*)    Bacteria, UA MANY (*)    All other components within normal limits  URINE CULTURE  LIPASE, BLOOD    EKG EKG Interpretation  Date/Time:  Thursday August 23 2019 15:22:15 EDT Ventricular Rate:  82 PR Interval:    QRS Duration: 79 QT Interval:  354 QTC Calculation: 414 R Axis:   7 Text Interpretation: Sinus rhythm Abnormal R-wave progression, early transition No significant change since last tracing Confirmed by Isla Pence [phone]) on 08/23/2019 3:45:29 PM   Radiology CT ABDOMEN PELVIS W CONTRAST  Result Date: 08/23/2019 CLINICAL DATA:  Abdominal pain. EXAM: CT ABDOMEN AND PELVIS WITH CONTRAST TECHNIQUE: Multidetector CT imaging of the abdomen and pelvis was performed  using the  standard protocol following bolus administration of intravenous contrast. CONTRAST:  186m OMNIPAQUE IOHEXOL 300 MG/ML  SOLN COMPARISON:  01/06/2015 FINDINGS: Lower chest: Unremarkable. Hepatobiliary: No suspicious focal abnormality within the liver parenchyma. 7 mm calcified gallstone. No intrahepatic or extrahepatic biliary dilation. Pancreas: No focal mass lesion. No dilatation of the main duct. No intraparenchymal cyst. No peripancreatic edema. Spleen: No splenomegaly. No focal mass lesion. Adrenals/Urinary Tract: No adrenal nodule or mass. Kidneys unremarkable. No evidence for hydroureter. The urinary bladder appears normal for the degree of distention. Stomach/Bowel: Stomach is unremarkable. No gastric wall thickening. No evidence of outlet obstruction. Duodenum is normally positioned as is the ligament of Treitz. No small bowel wall thickening. No small bowel dilatation. The terminal ileum is normal. The appendix is normal. No gross colonic mass. No colonic wall thickening. Vascular/Lymphatic: There is abdominal aortic atherosclerosis without aneurysm. There is no gastrohepatic or hepatoduodenal ligament lymphadenopathy. No retroperitoneal or mesenteric lymphadenopathy. No pelvic sidewall lymphadenopathy. Reproductive: The uterus is unremarkable.  There is no adnexal mass. Other: No intraperitoneal free fluid. Musculoskeletal: Supraumbilical midline ventral hernia identified with complex multi lobed hernia sac evident. Hernia sac contains only fat although there is some mild edema in the hernia sac. Fascial defect measures approximately 3 x 1.9 cm orthogonal diameters. Hernia sac measures on the order of 8.9 x 4.3 x 7.7 cm. There is a tiny umbilical hernia containing only fat. No worrisome lytic or sclerotic osseous abnormality. IMPRESSION: 1. Supraumbilical midline ventral hernia with a complex multi lobed hernia sac containing only fat. There does appear to be a small amount of edema or fluid in  the hernia sac and fat incarceration cannot be excluded. Measurements provided above. 2. Cholelithiasis. 3.  Aortic Atherosclerois (ICD10-170.0) Electronically Signed   By: EMisty StanleyM.D.   On: 08/23/2019 17:08    Procedures Procedures (including critical care time)  Medications Ordered in ED Medications  sodium chloride flush (NS) 0.9 % injection 3 mL (3 mLs Intravenous Given 08/23/19 1443)  famotidine (PEPCID) IVPB 20 mg premix (0 mg Intravenous Stopped 08/23/19 1533)  pantoprazole (PROTONIX) injection 40 mg (40 mg Intravenous Given 08/23/19 1444)  alum & mag hydroxide-simeth (MAALOX/MYLANTA) 200-200-20 MG/5ML suspension 30 mL (30 mLs Oral Given 08/23/19 1444)    And  lidocaine (XYLOCAINE) 2 % viscous mouth solution 15 mL (15 mLs Oral Given 08/23/19 1444)  iohexol (OMNIPAQUE) 300 MG/ML solution 100 mL (100 mLs Intravenous Contrast Given 08/23/19 1547)  morphine 4 MG/ML injection 4 mg (4 mg Intravenous Given 08/23/19 1901)    ED Course  I have reviewed the triage vital signs and the nursing notes.  Pertinent labs & imaging results that were available during my care of the patient were reviewed by me and considered in my medical decision making (see chart for details).  Clinical Course as of Aug 23 2019  Thu Aug 23, 2019  1747 Dr. WRedmond Pulling outpatient   [BM]    Clinical Course User Index [BM] MGari Crown  MDM Rules/Calculators/A&P                     Additional information gathered from chart review, I have reviewed patient's gastroenterology visit on 08/20/2019 where she presented with upper abdominal and periumbilical pain.  A CBC, CMP and CRP were performed.  It appears the CRP was within normal limits.  Per my interpretation there are no emergent electrolyte abnormalities, kidney injury or acute elevation of LFTs on CMP.  CBC showed no leukocytosis or  anemia.  It appears patient was prescribed famotidine 20 mg daily and Bentyl 10 mg every 8 hours and has a scheduled CT abdomen  pelvis on April 12.  It appears that they are also scheduling an EGD sometime after the CT abdomen pelvis. - On exam patient well-appearing no acute distress vital signs stable.  She does have tenderness on the periumbilical area, no overlying skin changes.  She denies any infectious type symptoms nausea vomiting or diarrhea.  Suspect possible reflux disease as etiology of patient's symptoms will give GI cocktail, Protonix and Pepcid and reassess.  Will obtain CT abdomen pelvis in the ER today for assessment of patient's abdominal pain. - I have personally ordered, reviewed and interpreted labs today which include CBC, lipase, urinalysis and CMP.  CBC shows no evidence of anemia and no leukocytosis to suggest infection.  Lipase within normal limits no evidence of pancreatitis.  CMP shows no emergent electrolyte derangement, evidence of kidney injury or acute elevation of LFTs.  Of note patient's mild hypokalemia from CMP performed outpatient resolved.  Urinalysis shows large leukocytes, many bacteria, 6/10 squamous cells appears contaminated, patient without urinary symptoms doubt UTI, will send for culture.  CT Abdomen/Pelvis:    IMPRESSION:  1. Supraumbilical midline ventral hernia with a complex multi lobed  hernia sac containing only fat. There does appear to be a small  amount of edema or fluid in the hernia sac and fat incarceration  cannot be excluded. Measurements provided above.  2. Cholelithiasis.  3. Aortic Atherosclerois (ICD10-170.0)  - 5:47 PM: Discussed case with general surgeon Dr. Redmond Pulling, reviewed labs and imaging.  He advises that patient can follow-up with his office as outpatient, no indication for admission or surgery at this time.  Advises pain control. - Patient was reassessed resting comfortably no acute distress she reports no improvement of symptoms following GI cocktail, Pepcid and Protonix given in the ER.  4 mg morphine ordered for control patient's pain secondary to  hernia.  On reassessment there are no overlying skin changes of the abdomen, minimally tender to palpation of the area. - Patient was given 4 mg morphine, approximately 1 hour later I reassessed the patient she is now resting comfortably in bed no acute distress reports resolution of pain and she is requesting discharge.  I reviewed patient's PDMP, there are no previous narcotic prescriptions, feel is reasonable to prescribe patient 6 pills Norco for her pain secondary to hernia.  I advised patient of narcotic precautions and she stated understanding, her son is coming to drive her home from the ER today.  Initially patient's Norco was sent to a closed pharmacy I then changed the pharmacy to [facility] off of [address].  Patient is aware to call general surgeon's office tomorrow morning, contact information given.  Patient also advised on warm compresses and other symptomatic care of hernia.  We discussed return precautions at length and she stated understanding.  Vital signs stable.  Patient was also informed of incidental findings on CT scan and to follow-up with PCP and general surgery.  At this time there does not appear to be any evidence of an acute emergency medical condition and the patient appears stable for discharge with appropriate outpatient follow up. Diagnosis was discussed with patient who verbalizes understanding of care plan and is agreeable to discharge. I have discussed return precautions with patient who verbalizes understanding of return precautions. Patient encouraged to follow-up with their PCP and GI. All questions answered.  Patient's case discussed with  Dr. Eulis Foster who agrees with plan to discharge with follow-up.   Note: Portions of this report may have been transcribed using voice recognition software. Every effort was made to ensure accuracy; however, inadvertent computerized transcription errors may still be present. Final Clinical Impression(s) / ED Diagnoses Final  diagnoses:  Ventral hernia without obstruction or gangrene  Calculus of gallbladder without cholecystitis without obstruction  Aortic atherosclerosis ([place])  Umbilical hernia without obstruction and without gangrene    Rx / DC Orders ED Discharge Orders         Ordered    HYDROcodone-acetaminophen (NORCO/VICODIN) 5-325 MG tablet  Every 6 hours PRN,   Status:  Discontinued     08/23/19 2010    HYDROcodone-acetaminophen (NORCO/VICODIN) 5-325 MG tablet  Every 6 hours PRN     08/23/19 2014           Gari Crown 08/23/19 2021    Daleen Bo, MD 08/23/19 2151

## 2019-08-23 NOTE — Discharge Instructions (Addendum)
You have been diagnosed today with Ventral Hernia.  At this time there does not appear to be the presence of an emergent medical condition, however there is always the potential for conditions to change. Please read and follow the below instructions.  Please return to the Emergency Department immediately for any new or worsening symptoms. Please be sure to follow up with your Primary Care Provider within one week regarding your visit today; please call their office to schedule an appointment even if you are feeling better for a follow-up visit. Please call the general surgeon Dr. Redmond Pulling tomorrow morning to schedule a follow-up appointment for further evaluation and treatment of your hernia.  Please use warm heating pads and rest to help with your symptoms.  You may use the pain medication Norco as prescribed to help with your pain.  Norco will make you drowsy so do not drive or perform any activities that may be dangerous after taking it.  Additionally do not drink alcohol or take any sedating medications with Norco as this will worsen side effects. You were given a pain medication in the ER today called morphine, this may make you drowsy so do not drive or perform any dangerous activities for the rest of the day. Your CT scan showed incidental findings including gallstones, aortic atherosclerosis and a second hernia around your bellybutton.  Please discuss these incidental findings with your primary care provider as well as with the general surgeon.  Get help right away if: You have a fever. You have belly pain that gets worse. You feel sick to your stomach (nauseous). You throw up (vomit). Your hernia cannot be pushed in by very gently pressing on it when you are lying down. Do not try to force the bulge back in if it will not push in easily. Your hernia: Changes in shape or size. Changes color. Feels hard or it hurts when you touch it. You have any new/concerning or worsening of  symptoms.  Please read the additional information packets attached to your discharge summary.  Do not take your medicine if  develop an itchy rash, swelling in your mouth or lips, or difficulty breathing; call 911 and seek immediate emergency medical attention if this occurs.  Note: Portions of this text may have been transcribed using voice recognition software. Every effort was made to ensure accuracy; however, inadvertent computerized transcription errors may still be present.

## 2019-08-23 NOTE — ED Triage Notes (Signed)
Patient complains of pain at umbilicus for a few days, to have CT Monday but states that pain increased and cant wait, no nausea, no vomiting.

## 2019-08-24 ENCOUNTER — Telehealth: Payer: Self-pay | Admitting: Family

## 2019-08-24 LAB — URINE CULTURE: Culture: 30000 — AB

## 2019-08-24 NOTE — Telephone Encounter (Signed)
CALLER : RAGHAD KEVER Call Back  # [phone]   Per patient another doctor stated her potassium was low, patient could not think of doctor name she saw. Maryanna Shape office in [place] per patient.   Please advise

## 2019-08-24 NOTE — Telephone Encounter (Signed)
Reviewed ER labs.  K+ is now normal. No need to adjust potassium.  Tks.

## 2019-08-27 ENCOUNTER — Ambulatory Visit (HOSPITAL_COMMUNITY): Payer: Medicare Other

## 2019-08-28 NOTE — Telephone Encounter (Signed)
Noted  

## 2019-09-04 ENCOUNTER — Ambulatory Visit (INDEPENDENT_AMBULATORY_CARE_PROVIDER_SITE_OTHER): Payer: Medicare Other | Admitting: Nurse Practitioner

## 2019-09-04 ENCOUNTER — Encounter: Payer: Self-pay | Admitting: Nurse Practitioner

## 2019-09-04 VITALS — BP 116/58 | HR 85 | Temp 98.3°F | Ht 65.0 in | Wt 314.6 lb

## 2019-09-04 DIAGNOSIS — K439 Ventral hernia without obstruction or gangrene: Secondary | ICD-10-CM

## 2019-09-04 DIAGNOSIS — K5900 Constipation, unspecified: Secondary | ICD-10-CM | POA: Diagnosis not present

## 2019-09-04 NOTE — Progress Notes (Signed)
09/04/2019 Leah Hampton [id number] 04/30/1956   Chief Complaint:  Abdominal pain   History of Present Illness: Leah Hampton is a 64 year old female with a past medical history of arthritis, hypertension, hyperlipidemia and colon polyps. S/P D & C 05/22/2019.  I saw the patient in the office on 08/20/2019 with complaints of central abdominal pain as well as pain to the right and left sides of her abdomen.  On exam, she had a moderate sized area of firmness to the right upper quadrant, right of the umbilicus.  An abdominal/pelvic CT was ordered.  However, her central abdominal pain worsen so she presented to [facility] emergency room on 08/23/2019.  An abdominal/pelvic CT was done in the ED which identified a supraumbilical midline ventral hernia with complex multi lobed hernia sac evident. Hernia sac contains only fat although there is some mild edema in the hernia sac. Fascial defect measures approximately 3 x 1.9cm cm . Hernia sac measures on the order of 8.9 x 4.3 x 7.7 cm. There is a tiny umbilical hernia containing only fat. No worrisome lytic or sclerotic osseous abnormality.  WBC 9.1. Hg 12.3. CMP was normal. The ER physician consulted with general surgeon Dr. Redmond Pulling who assessed the patient did not require urgent surgery and outpatient follow-up was scheduled.  She presents today for further GI evaluation.  She continues to have central abdominal pain and has difficulty sleeping on her right or left side.  She dislikes sleeping on her back.  No nausea or vomiting.  Her appetite is good.  She is passing normal formed brown bowel movement twice daily, her stools are smaller in size.  No diarrhea.  No rectal bleeding.  She stopped taking Mobic 1 month ago.  She last took Aleve 2 weeks ago.  No other complaints today.  CBC Latest Ref Rng & Units 08/23/2019 08/20/2019 05/21/2019  WBC 4.0 - 10.5 K/uL 9.1 9.5 8.4  Hemoglobin 12.0 - 15.0 g/dL 12.3 12.8 12.0  Hematocrit 36.0 - 46.0 % 38.8 38.7 38.3    Platelets 150 - 400 K/uL 298 308.0 286    CMP Latest Ref Rng & Units 08/23/2019 08/20/2019 07/27/2019  Glucose 70 - 99 mg/dL 119(H) 111(H) 108(H)  BUN 8 - 23 mg/dL 17 18 19   Creatinine 0.44 - 1.00 mg/dL 0.99 0.95 0.97  Sodium 135 - 145 mmol/L 142 140 141  Potassium 3.5 - 5.1 mmol/L 4.0 3.3(L) 3.5  Chloride 98 - 111 mmol/L 104 102 103  CO2 22 - 32 mmol/L 29 31 31   Calcium 8.9 - 10.3 mg/dL 9.4 9.6 9.2  Total Protein 6.5 - 8.1 g/dL 7.2 7.8 7.1  Total Bilirubin 0.3 - 1.2 mg/dL 0.4 0.4 0.3  Alkaline Phos 38 - 126 U/L 63 71 70  AST 15 - 41 U/L 29 19 18   ALT 0 - 44 U/L 23 18 18      Abdominal/pelvic CT 08/23/2019:  1. Supraumbilical midline ventral hernia with a complex multi lobed hernia sac containing only fat. There does appear to be a small amount of edema or fluid in the hernia sac and fat incarceration cannot be excluded. Measurements provided above. 2. Cholelithiasis. 3.  Aortic Atherosclerois   Colonoscopy 03/27/2015 by Dr. Ardis Hughs: Normal. No polyps.  Recall colonoscopy 5 years due to history of tubular adenomatous colon polyps in 2011.   Colonoscopy 07/02/2009: 3 polyps were removed from the ascending, descending and sigmoid colon, one polyp was > 1 cm, two were retrieved and sent  to pathology. Otherwise normal exam.  1. COLON, POLYP(S), ASCENDING, DESCENDING :  - TUBULAR ADENOMA.  - polypoid colorectal mucosa with reactive lymphoid aggregates.  - high grade dysplasia is not identified.    Current Medications, Allergies, Past Medical History, Past Surgical History, Family History and Social History were reviewed in [employer] record.   Physical Exam: BP (!) 116/58   Pulse 85   Temp 98.3 F (36.8 C)   Ht 5\' 5"  (1.651 m)   Wt (!) 314 lb 9.6 oz (142.7 kg)   LMP 11/10/2011   BMI 52.35 kg/m    General: obese female in no acute distress. Head: Normocephalic and atraumatic. Eyes: No scleral icterus. Conjunctiva pink . Lungs: Clear throughout  to auscultation. Heart: Regular rate and rhythm, no murmur. Abdomen: Soft, large firm area RUQ tender without significant change from prior exam, findings consistent with ventral hernia identified on CTAP.  Rectal: Deferred.  Musculoskeletal: Symmetrical with no gross deformities. Extremities: No edema. Neurological: Alert oriented x 4. No focal deficits.  Psychological: Alert and cooperative. Normal mood and affect  Assessment and Recommendations:  38. 64 year old female with central abdominal pain. CTAO 4/8 identified a large supraumbilical ventral hernia with a complex multi lobed hernia sac containing fluid and and edema.  -Proceed with general surgeon Dr. [name] consult next week as scheduled  -Patient advised to go to the ED if she develops severe abdominal pain or N/V -Patient to call me with an update after she sees general surgeon Dr. [name]   2. Constipation -Miralax Q HS  3. History of colon polyps -Next colonoscopy due 03/2020

## 2019-09-04 NOTE — Patient Instructions (Addendum)
If you are age 62 or older, your body mass index should be between 23-30. Your Body mass index is 52.35 kg/m. If this is out of the aforementioned range listed, please consider follow up with your Primary Care Provider.  If you are age 75 or younger, your body mass index should be between 19-25. Your Body mass index is 52.35 kg/m. If this is out of the aformentioned range listed, please consider follow up with your Primary Care Provider.  Take Miralax 1 capful mixed in 8 ounces of water at bed time for constipation as tolerated.  Proceed with your consul with general surgeon for next week.  Go to the Emergency if severe abdominal pain or nausea and vomiting.  Colonoscopy is due 03/2020 Due to recent changes in healthcare laws, you may see the results of your imaging and laboratory studies on MyChart before your provider has had a chance to review them.  We understand that in some cases there may be results that are confusing or concerning to you. Not all laboratory results come back in the same time frame and the provider may be waiting for multiple results in order to interpret others.  Please give [place] 48 hours in order for your provider to thoroughly review all the results before contacting the office for clarification of your results.    Thank you for choosing [facility] Noralyn Pick, CRNP

## 2019-09-05 NOTE — Progress Notes (Signed)
I agree with the above note, plan.  The hernia needs to be repaired.

## 2019-09-12 ENCOUNTER — Ambulatory Visit: Payer: Medicare Other | Attending: Internal Medicine

## 2019-09-12 DIAGNOSIS — Z23 Encounter for immunization: Secondary | ICD-10-CM

## 2019-09-12 NOTE — Progress Notes (Signed)
   Covid-19 Vaccination Clinic  Name:  MARKETIA TENBROECK    MRN: [id number] DOB: 30-Aug-1955  09/12/2019  Ms. Maragos was observed post Covid-19 immunization for 15 minutes without incident. She was provided with Vaccine Information Sheet and instruction to access the V-Safe system.   Ms. Joncas was instructed to call 911 with any severe reactions post vaccine: Marland Kitchen Difficulty breathing  . Swelling of face and throat  . A fast heartbeat  . A bad rash all over body  . Dizziness and weakness   Immunizations Administered    Name Date Dose VIS Date Route   Pfizer COVID-19 Vaccine 09/12/2019 10:54 AM 0.3 mL 07/11/2018 Intramuscular   Manufacturer: [place]   Lot: [id number]   [place]: [id number]

## 2019-09-13 DIAGNOSIS — K436 Other and unspecified ventral hernia with obstruction, without gangrene: Secondary | ICD-10-CM | POA: Diagnosis not present

## 2019-09-13 DIAGNOSIS — K429 Umbilical hernia without obstruction or gangrene: Secondary | ICD-10-CM | POA: Diagnosis not present

## 2019-09-13 DIAGNOSIS — K8020 Calculus of gallbladder without cholecystitis without obstruction: Secondary | ICD-10-CM | POA: Diagnosis not present

## 2019-09-14 ENCOUNTER — Telehealth: Payer: Self-pay | Admitting: *Deleted

## 2019-09-14 MED ORDER — CLOTRIMAZOLE-BETAMETHASONE 1-0.05 % EX CREA: 1.0000 "application " | TOPICAL_CREAM | Freq: Every day | CUTANEOUS | 0 refills | Status: DC | PRN

## 2019-09-14 NOTE — Telephone Encounter (Signed)
[place], receptionist states pt complains of itching feet and would like refill of cream to get to 09/25/2019. Dr. Berton Lan one refill of the Lotrisone Cream.

## 2019-09-18 ENCOUNTER — Telehealth: Payer: Self-pay | Admitting: *Deleted

## 2019-09-18 NOTE — Telephone Encounter (Signed)
I informed pt the Lotrisone cream was called to the [facility] on [address].

## 2019-09-18 NOTE — Telephone Encounter (Signed)
Pt states she left a message requesting cream for her itching feet yesterday.

## 2019-09-25 ENCOUNTER — Ambulatory Visit (INDEPENDENT_AMBULATORY_CARE_PROVIDER_SITE_OTHER): Payer: Medicare Other | Admitting: Podiatry

## 2019-09-25 ENCOUNTER — Other Ambulatory Visit: Payer: Self-pay

## 2019-09-25 DIAGNOSIS — B353 Tinea pedis: Secondary | ICD-10-CM

## 2019-09-25 DIAGNOSIS — L299 Pruritus, unspecified: Secondary | ICD-10-CM | POA: Diagnosis not present

## 2019-09-25 MED ORDER — TERBINAFINE HCL 250 MG PO TABS: 250.0000 mg | ORAL_TABLET | Freq: Every day | ORAL | 0 refills | Status: DC

## 2019-09-25 MED ORDER — CLOTRIMAZOLE-BETAMETHASONE 1-0.05 % EX CREA: 1.0000 "application " | TOPICAL_CREAM | Freq: Every day | CUTANEOUS | 0 refills | Status: DC | PRN

## 2019-09-30 NOTE — Progress Notes (Signed)
Subjective: 64 year old female presents the office with concerns of itching to both of her feet.  She states that the skin gets dry, cracked in between her toes and causes itching.  Reports they called the cream for her but she did not receive this. Denies any systemic complaints such as fevers, chills, nausea, vomiting. No acute changes since last appointment, and no other complaints at this time.   Objective: AAO x3, NAD DP/PT pulses palpable bilaterally, CRT less than 3 seconds Dry, cracked, erythematous skin interdigitally consistent with tinea pedis.  There is no open lesions otherwise there is no drainage or pus. No pain with calf compression, swelling, warmth, erythema  Assessment: Tinea pedis-chronic  Plan: -All treatment options discussed with the patient including all alternatives, risks, complications.  -Prescribed a 2-week course of Lamisil as well as Lotrisone cream.  Discussed shoe, sock modifications.  Dry thoroughly between her toes. -Patient encouraged to call the office with any questions, concerns, change in symptoms.   Trula Slade DPM

## 2019-10-12 ENCOUNTER — Other Ambulatory Visit: Payer: Self-pay

## 2019-10-12 ENCOUNTER — Telehealth: Payer: Self-pay

## 2019-10-12 ENCOUNTER — Ambulatory Visit (INDEPENDENT_AMBULATORY_CARE_PROVIDER_SITE_OTHER): Payer: Medicare Other | Admitting: Family

## 2019-10-12 ENCOUNTER — Encounter: Payer: Self-pay | Admitting: Family

## 2019-10-12 DIAGNOSIS — Z6841 Body Mass Index (BMI) 40.0 and over, adult: Secondary | ICD-10-CM | POA: Diagnosis not present

## 2019-10-12 DIAGNOSIS — I10 Essential (primary) hypertension: Secondary | ICD-10-CM

## 2019-10-12 MED ORDER — POTASSIUM CHLORIDE CRYS ER 20 MEQ PO TBCR: 20.0000 meq | EXTENDED_RELEASE_TABLET | Freq: Every day | ORAL | 1 refills | Status: DC

## 2019-10-12 MED ORDER — OMEPRAZOLE 20 MG PO CPDR: 20.0000 mg | DELAYED_RELEASE_CAPSULE | Freq: Every day | ORAL | 1 refills | Status: DC

## 2019-10-12 MED ORDER — VALACYCLOVIR HCL 1 G PO TABS: 1000.0000 mg | ORAL_TABLET | Freq: Every day | ORAL | 1 refills | Status: DC

## 2019-10-12 MED ORDER — ALBUTEROL SULFATE HFA 108 (90 BASE) MCG/ACT IN AERS: 2.0000 | INHALATION_SPRAY | Freq: Four times a day (QID) | RESPIRATORY_TRACT | 1 refills | Status: DC | PRN

## 2019-10-12 MED ORDER — PRAVASTATIN SODIUM 80 MG PO TABS: 80.0000 mg | ORAL_TABLET | Freq: Every day | ORAL | 1 refills | Status: DC

## 2019-10-12 NOTE — Telephone Encounter (Signed)
Patient called in to get a prescription refill for potassium chloride SA (KLOR-CON) 20 MEQ tablet [id number]    Please  send it to  [facility], [address], [place] [address]  Phone:  [phone] Fax:  [phone]  DEA #:  --

## 2019-10-12 NOTE — Progress Notes (Signed)
Subjective:    Patient ID: Leah Hampton, female    DOB: [birth year], 64 y.o.   MRN: [id number]  HPI  Patient is a 64 yr old female who presents today to discuss plans for bariatric surgery. She is requesting paperwork and a letter of recommendation for bariatric surgery to bring to her surgeon for insurance purposes.  She has tried multiple diets without success including slimfast, independent diet with calorie restriction, she is also been doing weight loss clinic.  She has tried over-the-counter diet pills.  All of these attempts have been unsuccessful.  She has multiple comorbidities related to her morbid obesity including hypertension, diabetes, degenerative joint disease and hyperlipidemia.  Wt Readings from Last 3 Encounters:  10/12/19 (!) 315 lb 9.6 oz (143.2 kg)  09/04/19 (!) 314 lb 9.6 oz (142.7 kg)  08/20/19 (!) 312 lb 9.6 oz (141.8 kg)      Review of Systems See HPI    Past Medical History:  Diagnosis Date  . Bronchitis   . Cystitis   . Diabetes type 2, controlled ([place])   . Hematuria    urologice eval, Dr Estill Dooms  . History of chest pain   . Hyperbilirubinemia   . Hyperlipemia   . Hypertelorism   . Hypertension   . Leukocytosis   . Obesity   . Osteoarthritis    right knee  . Sleep apnea 2012   slight   . Vaginal Pap smear, abnormal   . Vitamin D deficiency 02/09/2018     Social History   Socioeconomic History  . Marital status: Single    Spouse name: Not on file  . Number of children: 2  . Years of education: Not on file  . Highest education level: Not on file  Occupational History  . Occupation: disabled  Tobacco Use  . Smoking status: Never Smoker  . Smokeless tobacco: Never Used  Substance and Sexual Activity  . Alcohol use: No  . Drug use: No  . Sexual activity: Not Currently  Other Topics Concern  . Not on file  Social History Narrative   Regular exercise:  No   Lives with her son   Has 2 grown sons, both local.  1 grandson   On  disability due to her knee pain   Completed 12th grade   Enjoys television   No pets.    Social Determinants of Health   Financial Resource Strain: Low Risk   . Difficulty of Paying Living Expenses: Not hard at all  Food Insecurity: No Food Insecurity  . Worried About Charity fundraiser in the Last Year: Never true  . Ran Out of Food in the Last Year: Never true  Transportation Needs: No Transportation Needs  . Lack of Transportation (Medical): No  . Lack of Transportation (Non-Medical): No  Physical Activity:   . Days of Exercise per Week:   . Minutes of Exercise per Session:   Stress:   . Feeling of Stress :   Social Connections:   . Frequency of Communication with Friends and Family:   . Frequency of Social Gatherings with Friends and Family:   . Attends Religious Services:   . Active Member of Clubs or Organizations:   . Attends Archivist Meetings:   Marland Kitchen Marital Status:   Intimate Partner Violence:   . Fear of Current or Ex-Partner:   . Emotionally Abused:   Marland Kitchen Physically Abused:   . Sexually Abused:     Past Surgical History:  Procedure Laterality Date  . COLONOSCOPY WITH PROPOFOL N/A 03/27/2015   Procedure: COLONOSCOPY WITH PROPOFOL;  Surgeon: Milus Banister, MD;  Location: [facility];  Service: Endoscopy;  Laterality: N/A;  . DILATATION & CURETTAGE/HYSTEROSCOPY WITH MYOSURE N/A 01/05/2017   Procedure: DILATATION & CURETTAGE/HYSTEROSCOPY WITH MYOSURE;  Surgeon: Thurnell Lose, MD;  Location: [facility];  Service: Gynecology;  Laterality: N/A;  PostMenopausal Bleeding  . HYSTEROSCOPY WITH D & C N/A 05/22/2019   Procedure: DILATATION AND CURETTAGE /HYSTEROSCOPY, POLYPECTOMY WITH MYOSURE;  Surgeon: Lavonia Drafts, MD;  Location: [facility];  Service: Gynecology;  Laterality: N/A;  . KNEE ARTHROSCOPY  04/2007   left knee  . TOTAL KNEE ARTHROPLASTY  10/2008   Dr Roxine Caddy    Family History  Problem Relation Age of Onset  . Heart failure Mother   . Arthritis  Mother   . Hypertension Mother   . Stroke Mother   . Diabetes Mellitus II Mother   . Heart attack Father 26  . Diabetes Other   . Arthritis Other   . Stroke Other   . Coronary artery disease Other   . Hypertension Sister        x 2  . CVA Sister   . CAD Sister 93  . Diabetes Mellitus II Sister   . Cirrhosis Brother   . Alcohol abuse Brother   . Colon cancer Neg Hx     Allergies  Allergen Reactions  . Sulfamethoxazole-Trimethoprim [Bactrim] Itching  . Codeine Nausea And Vomiting and Anxiety    Current Outpatient Medications on File Prior to Visit  Medication Sig Dispense Refill  . albuterol (VENTOLIN HFA) 108 (90 Base) MCG/ACT inhaler Inhale 2 puffs into the lungs every 6 (six) hours as needed for wheezing or shortness of breath.    . clotrimazole-betamethasone (LOTRISONE) cream Apply 1 application topically daily as needed (for itching). 45 g 0  . dicyclomine (BENTYL) 10 MG capsule Every 8 hours as needed for abdominal pain (Patient taking differently: Take 10 mg by mouth 3 (three) times daily as needed for spasms. Every 8 hours as needed for abdominal pain) 30 capsule 0  . famotidine (PEPCID) 20 MG tablet Take 1 tablet (20 mg total) by mouth daily. 30 tablet 1  . fluticasone (FLOVENT HFA) 110 MCG/ACT inhaler Inhale 2 puffs into the lungs 2 (two) times daily. 36 g 1  . HYDROcodone-acetaminophen (NORCO/VICODIN) 5-325 MG tablet Take 1-2 tablets by mouth every 6 (six) hours as needed. 6 tablet 0  . losartan-hydrochlorothiazide (HYZAAR) 100-25 MG tablet Take 1 tablet by mouth daily. 90 tablet 1  . Multiple Vitamin (MULTIVITAMIN WITH MINERALS) TABS tablet Take 1 tablet by mouth daily. Vitafusion    . potassium chloride SA (KLOR-CON) 20 MEQ tablet Take 1 tablet (20 mEq total) by mouth daily. 90 tablet 1  . pravastatin (PRAVACHOL) 80 MG tablet Take 1 tablet (80 mg total) by mouth daily. (Patient taking differently: Take 80 mg by mouth every evening. ) 90 tablet 1  . psyllium (METAMUCIL)  58.6 % powder Take 1 packet by mouth daily.    Marland Kitchen terbinafine (LAMISIL) 250 MG tablet Take 1 tablet (250 mg total) by mouth daily. 14 tablet 0  . valACYclovir (VALTREX) 1000 MG tablet Take 1 tablet (1,000 mg total) by mouth daily. 90 tablet 1  . Zoster Vaccine Adjuvanted [facility]) injection Inject 0.39m IM now and again in 2-6 months. 0.5 mL 1  . blood glucose meter kit and supplies KIT Dispense based on patient and insurance preference. Use up  to four times daily as directed. (FOR ICD-9 250.00, 250.01). (Patient not taking: Reported on 10/12/2019) 1 each 0   No current facility-administered medications on file prior to visit.    BP 120/63 (BP Location: Right Arm, Patient Position: Sitting, Cuff Size: Large)   Pulse 88   Temp (!) 97.2 F (36.2 C) (Temporal)   Resp 16   Ht 5' 5"  (1.651 m)   Wt (!) 315 lb 9.6 oz (143.2 kg)   LMP 11/10/2011   SpO2 100%   BMI 52.52 kg/m    Objective:   Physical Exam Constitutional:      Appearance: She is well-developed.  Neck:     Thyroid: No thyromegaly.  Cardiovascular:     Rate and Rhythm: Normal rate and regular rhythm.     Heart sounds: Normal heart sounds. No murmur.  Pulmonary:     Effort: Pulmonary effort is normal. No respiratory distress.     Breath sounds: Normal breath sounds. No wheezing.  Musculoskeletal:     Cervical back: Neck supple.  Skin:    General: Skin is warm and dry.  Neurological:     Mental Status: She is alert and oriented to person, place, and time.  Psychiatric:        Behavior: Behavior normal.        Thought Content: Thought content normal.        Judgment: Judgment normal.           Assessment & Plan:  Morbid obesity-I have written the letter of recommendation for her to give to her surgeon as well as filled out her paperwork.  30 minutes was spent on today's visit.  Time was spent reviewing her weights over the past 5 years, reviewing her previous diet attempts, and completing paperwork.  This visit  occurred during the SARS-CoV-2 public health emergency.  Safety protocols were in place, including screening questions prior to the visit, additional usage of staff PPE, and extensive cleaning of exam room while observing appropriate contact time as indicated for disinfecting solutions.

## 2019-10-12 NOTE — Telephone Encounter (Signed)
rx sent to mail order 

## 2019-10-17 ENCOUNTER — Other Ambulatory Visit: Payer: Self-pay | Admitting: Family

## 2019-10-17 DIAGNOSIS — Z1231 Encounter for screening mammogram for malignant neoplasm of breast: Secondary | ICD-10-CM

## 2019-10-18 ENCOUNTER — Ambulatory Visit: Payer: Medicare Other | Admitting: Podiatry

## 2019-10-19 ENCOUNTER — Telehealth: Payer: Self-pay | Admitting: *Deleted

## 2019-10-19 NOTE — Telephone Encounter (Signed)
Pharmacy sent a request for prescription and I called and left a message for the patient to see if she needed another refill and to see how patient was doing and to call the office at [phone]. Leah Hampton

## 2019-10-26 ENCOUNTER — Ambulatory Visit: Payer: Medicare Other | Admitting: Family

## 2019-11-06 ENCOUNTER — Other Ambulatory Visit: Payer: Self-pay

## 2019-11-06 ENCOUNTER — Ambulatory Visit (INDEPENDENT_AMBULATORY_CARE_PROVIDER_SITE_OTHER): Payer: Medicare Other | Admitting: Podiatry

## 2019-11-06 DIAGNOSIS — L989 Disorder of the skin and subcutaneous tissue, unspecified: Secondary | ICD-10-CM | POA: Diagnosis not present

## 2019-11-06 DIAGNOSIS — B353 Tinea pedis: Secondary | ICD-10-CM | POA: Diagnosis not present

## 2019-11-06 MED ORDER — TRIAMCINOLONE ACETONIDE 0.025 % EX OINT: 1.0000 "application " | TOPICAL_OINTMENT | Freq: Every day | CUTANEOUS | 0 refills | Status: DC

## 2019-11-08 DIAGNOSIS — B353 Tinea pedis: Secondary | ICD-10-CM | POA: Diagnosis not present

## 2019-11-11 NOTE — Progress Notes (Signed)
Subjective: 64 year old female presents the office today for evaluation of tinea pedis, interdigital skin lesions.  She states that the medicine has not been helping she still gets itching in between her toes and scratching the area.  She also gets this on the arch.  There are no open lesions.  Denies any systemic complaints such as fevers, chills, nausea, vomiting. No acute changes since last appointment, and no other complaints at this time.   Objective: AAO x3, NAD DP/PT pulses palpable bilaterally, CRT less than 3 seconds Interdigitally there is dry, erythematous skin.  Clinically appears to be improved but she still getting itching between the toes.  Dry, callused skin is also present. No pain with calf compression, swelling, warmth, erythema  Assessment: Interdigital skin lesions  Plan: -All treatment options discussed with the patient including all alternatives, risks, complications.  -Today debrided some of the skin and sent this as a biopsy to [facility] labs to further evaluate the webspaces.  We will await results before pursuing more definitive treatment. -Patient encouraged to call the office with any questions, concerns, change in symptoms.   Trula Slade DPM

## 2019-11-19 ENCOUNTER — Other Ambulatory Visit: Payer: Self-pay | Admitting: Family

## 2019-11-19 DIAGNOSIS — I10 Essential (primary) hypertension: Secondary | ICD-10-CM

## 2019-11-26 ENCOUNTER — Ambulatory Visit
Admission: RE | Admit: 2019-11-26 | Discharge: 2019-11-26 | Disposition: A | Discharge status: Home / Self Care | Payer: Medicare Other | Source: Ambulatory Visit | Attending: Family | Admitting: Family

## 2019-11-26 ENCOUNTER — Other Ambulatory Visit: Payer: Self-pay | Admitting: Podiatry

## 2019-11-26 ENCOUNTER — Other Ambulatory Visit: Payer: Self-pay

## 2019-11-26 DIAGNOSIS — Z1231 Encounter for screening mammogram for malignant neoplasm of breast: Secondary | ICD-10-CM | POA: Diagnosis not present

## 2019-11-26 MED ORDER — CIPROFLOXACIN HCL 500 MG PO TABS
500.0000 mg | ORAL_TABLET | Freq: Two times a day (BID) | ORAL | 0 refills | Status: DC
Start: 2019-11-26 — End: 2019-12-12

## 2019-11-26 MED ORDER — CICLOPIROX 0.77 % EX GEL: 1.0000 "application " | Freq: Two times a day (BID) | CUTANEOUS | 0 refills | Status: DC

## 2019-11-26 NOTE — Progress Notes (Signed)
c 

## 2019-11-27 ENCOUNTER — Telehealth: Payer: Self-pay | Admitting: *Deleted

## 2019-11-27 NOTE — Telephone Encounter (Signed)
Left message requesting call back to discuss results.

## 2019-11-27 NOTE — Telephone Encounter (Signed)
-----   Message from Trula Slade, DPM sent at 11/26/2019  5:36 PM EDT ----- Tivis Ringer- please let her know that I sent over an antibiotic orally for 10 days and also ciclopirox gel to the pharmacy. Follow up in 3-4 weeks

## 2019-11-28 ENCOUNTER — Telehealth: Payer: Self-pay | Admitting: Family

## 2019-11-28 NOTE — Telephone Encounter (Signed)
Patient advised she will receive a call from radiology for additional images

## 2019-11-28 NOTE — Telephone Encounter (Signed)
Please let pt know that the radiologist would like her to complete some additional breast images for further evaluation. Let me know if she has not been contacted by them about a follow up appointment in 1 week.   

## 2019-11-29 ENCOUNTER — Other Ambulatory Visit: Payer: Self-pay | Admitting: Family

## 2019-11-29 DIAGNOSIS — R928 Other abnormal and inconclusive findings on diagnostic imaging of breast: Secondary | ICD-10-CM

## 2019-11-30 ENCOUNTER — Telehealth: Payer: Self-pay | Admitting: Podiatry

## 2019-11-30 NOTE — Telephone Encounter (Signed)
-----   Message from Trula Slade, DPM sent at 11/26/2019  5:36 PM EDT ----- Tivis Ringer- please let her know that I sent over an antibiotic orally for 10 days and also ciclopirox gel to the pharmacy. Follow up in 3-4 weeks

## 2019-11-30 NOTE — Telephone Encounter (Signed)
Pt was returning call for results please advise

## 2019-11-30 NOTE — Telephone Encounter (Signed)
I informed pt of Dr. Leigh Aurora 11/26/2019 5:36pm orders. Pt asked if it was sent to the [facility] and I told her it was sent to the [facility] on [address].

## 2019-12-10 ENCOUNTER — Ambulatory Visit: Payer: Medicare Other

## 2019-12-10 ENCOUNTER — Ambulatory Visit
Admission: RE | Admit: 2019-12-10 | Discharge: 2019-12-10 | Disposition: A | Discharge status: Home / Self Care | Payer: Medicare Other | Source: Ambulatory Visit | Attending: Family | Admitting: Family

## 2019-12-10 ENCOUNTER — Other Ambulatory Visit: Payer: Self-pay

## 2019-12-10 DIAGNOSIS — R928 Other abnormal and inconclusive findings on diagnostic imaging of breast: Secondary | ICD-10-CM

## 2019-12-12 ENCOUNTER — Other Ambulatory Visit: Payer: Self-pay

## 2019-12-12 ENCOUNTER — Ambulatory Visit (INDEPENDENT_AMBULATORY_CARE_PROVIDER_SITE_OTHER): Payer: Medicare Other | Admitting: Family

## 2019-12-12 ENCOUNTER — Encounter: Payer: Self-pay | Admitting: Family

## 2019-12-12 VITALS — BP 127/65 | HR 82 | Temp 98.4°F | Resp 16 | Ht 66.0 in | Wt 311.0 lb

## 2019-12-12 DIAGNOSIS — I10 Essential (primary) hypertension: Secondary | ICD-10-CM

## 2019-12-12 DIAGNOSIS — E559 Vitamin D deficiency, unspecified: Secondary | ICD-10-CM

## 2019-12-12 DIAGNOSIS — A6000 Herpesviral infection of urogenital system, unspecified: Secondary | ICD-10-CM | POA: Diagnosis not present

## 2019-12-12 DIAGNOSIS — E782 Mixed hyperlipidemia: Secondary | ICD-10-CM | POA: Diagnosis not present

## 2019-12-12 DIAGNOSIS — E119 Type 2 diabetes mellitus without complications: Secondary | ICD-10-CM | POA: Diagnosis not present

## 2019-12-12 LAB — COMPREHENSIVE METABOLIC PANEL
ALT: 15 U/L (ref 0–35)
AST: 17 U/L (ref 0–37)
Albumin: 3.7 g/dL (ref 3.5–5.2)
Alkaline Phosphatase: 66 U/L (ref 39–117)
BUN: 18 mg/dL (ref 6–23)
CO2: 32 mEq/L (ref 19–32)
Calcium: 9.4 mg/dL (ref 8.4–10.5)
Chloride: 102 mEq/L (ref 96–112)
Creatinine, Ser: 1.01 mg/dL (ref 0.40–1.20)
GFR: 67.16 mL/min (ref >60.00)
Glucose, Bld: 110 mg/dL — ABNORMAL HIGH (ref 70–99)
Potassium: 3.5 mEq/L (ref 3.5–5.1)
Sodium: 139 mEq/L (ref 135–145)
Total Bilirubin: 0.4 mg/dL (ref 0.2–1.2)
Total Protein: 7.5 g/dL (ref 6.0–8.3)

## 2019-12-12 LAB — HEMOGLOBIN A1C: Hgb A1c MFr Bld: 6.5 % (ref 4.6–6.5)

## 2019-12-12 NOTE — Progress Notes (Signed)
Subjective:    Patient ID: Leah Hampton, female    DOB: 1955-12-01, 64 y.o.   MRN: [id number]  HPI   Patient is a 64 yr old female who presents today for follow up.  HTN- on hyzaar and kdur. BP Readings from Last 3 Encounters:  12/12/19 127/65  10/12/19 120/63  09/04/19 (!) 116/58   Hyperlipidemia- maintained on pravachol. Denies myalgia. Lab Results  Component Value Date   CHOL 165 07/27/2019   HDL 46.40 07/27/2019   LDLCALC 102 (H) 07/27/2019   TRIG 85.0 07/27/2019   CHOLHDL 4 07/27/2019   Diabetes type 2-diet controlled. She has started air frying her foods. Lab Results  Component Value Date   HGBA1C 6.6 (H) 07/27/2019    Genital herpes- maintained on valtrex.  Denies breakouts.   Morbid obesity-  Wt Readings from Last 3 Encounters:  12/12/19 (!) 311 lb (141.1 kg)  10/12/19 (!) 315 lb 9.6 oz (143.2 kg)  09/04/19 (!) 314 lb 9.6 oz (142.7 kg)   Vit D deficiency- not on supplement.   GERD- maintained on omeprazole. Reports symptoms well controlled on this regimen.   Hyperlipidemia- maintained on statin.    Review of Systems See HPI  Past Medical History:  Diagnosis Date  . Bronchitis   . Cystitis   . Diabetes type 2, controlled ([place])   . Hematuria    urologice eval, Dr Estill Dooms  . History of chest pain   . Hyperbilirubinemia   . Hyperlipemia   . Hypertelorism   . Hypertension   . Leukocytosis   . Obesity   . Osteoarthritis    right knee  . Sleep apnea 2012   slight   . Vaginal Pap smear, abnormal   . Vitamin D deficiency 02/09/2018     Social History   Socioeconomic History  . Marital status: Single    Spouse name: Not on file  . Number of children: 2  . Years of education: Not on file  . Highest education level: Not on file  Occupational History  . Occupation: disabled  Tobacco Use  . Smoking status: Never Smoker  . Smokeless tobacco: Never Used  Vaping Use  . Vaping Use: Never used  Substance and Sexual Activity  . Alcohol use: No   . Drug use: No  . Sexual activity: Not Currently  Other Topics Concern  . Not on file  Social History Narrative   Regular exercise:  No   Lives with her son   Has 2 grown sons, both local.  1 grandson   On disability due to her knee pain   Completed 12th grade   Enjoys television   No pets.    Social Determinants of Health   Financial Resource Strain: Low Risk   . Difficulty of Paying Living Expenses: Not hard at all  Food Insecurity: No Food Insecurity  . Worried About Charity fundraiser in the Last Year: Never true  . Ran Out of Food in the Last Year: Never true  Transportation Needs: No Transportation Needs  . Lack of Transportation (Medical): No  . Lack of Transportation (Non-Medical): No  Physical Activity:   . Days of Exercise per Week:   . Minutes of Exercise per Session:   Stress:   . Feeling of Stress :   Social Connections:   . Frequency of Communication with Friends and Family:   . Frequency of Social Gatherings with Friends and Family:   . Attends Religious Services:   . Active  Member of Clubs or Organizations:   . Attends Archivist Meetings:   Marland Kitchen Marital Status:   Intimate Partner Violence:   . Fear of Current or Ex-Partner:   . Emotionally Abused:   Marland Kitchen Physically Abused:   . Sexually Abused:     Past Surgical History:  Procedure Laterality Date  . COLONOSCOPY WITH PROPOFOL N/A 03/27/2015   Procedure: COLONOSCOPY WITH PROPOFOL;  Surgeon: Milus Banister, MD;  Location: [facility];  Service: Endoscopy;  Laterality: N/A;  . DILATATION & CURETTAGE/HYSTEROSCOPY WITH MYOSURE N/A 01/05/2017   Procedure: DILATATION & CURETTAGE/HYSTEROSCOPY WITH MYOSURE;  Surgeon: Thurnell Lose, MD;  Location: [facility];  Service: Gynecology;  Laterality: N/A;  PostMenopausal Bleeding  . HYSTEROSCOPY WITH D & C N/A 05/22/2019   Procedure: DILATATION AND CURETTAGE /HYSTEROSCOPY, POLYPECTOMY WITH MYOSURE;  Surgeon: Lavonia Drafts, MD;  Location: [facility];  Service:  Gynecology;  Laterality: N/A;  . KNEE ARTHROSCOPY  04/2007   left knee  . TOTAL KNEE ARTHROPLASTY  10/2008   Dr Roxine Caddy    Family History  Problem Relation Age of Onset  . Heart failure Mother   . Arthritis Mother   . Hypertension Mother   . Stroke Mother   . Diabetes Mellitus II Mother   . Heart attack Father 36  . Diabetes Other   . Arthritis Other   . Stroke Other   . Coronary artery disease Other   . Hypertension Sister        x 2  . CVA Sister   . CAD Sister 65  . Diabetes Mellitus II Sister   . Cirrhosis Brother   . Alcohol abuse Brother   . Colon cancer Neg Hx     Allergies  Allergen Reactions  . Sulfamethoxazole-Trimethoprim [Bactrim] Itching  . Codeine Nausea And Vomiting and Anxiety    Current Outpatient Medications on File Prior to Visit  Medication Sig Dispense Refill  . albuterol (VENTOLIN HFA) 108 (90 Base) MCG/ACT inhaler Inhale 2 puffs into the lungs every 6 (six) hours as needed for wheezing or shortness of breath. 18 g 1  . blood glucose meter kit and supplies KIT Dispense based on patient and insurance preference. Use up to four times daily as directed. (FOR ICD-9 250.00, 250.01). 1 each 0  . Ciclopirox 0.77 % gel Apply 1 application topically 2 (two) times daily. 45 g 0  . clotrimazole-betamethasone (LOTRISONE) cream Apply 1 application topically daily as needed (for itching). 45 g 0  . dicyclomine (BENTYL) 10 MG capsule Every 8 hours as needed for abdominal pain (Patient taking differently: Take 10 mg by mouth 3 (three) times daily as needed for spasms. Every 8 hours as needed for abdominal pain) 30 capsule 0  . fluticasone (FLOVENT HFA) 110 MCG/ACT inhaler Inhale 2 puffs into the lungs 2 (two) times daily. 36 g 1  . losartan-hydrochlorothiazide (HYZAAR) 100-25 MG tablet TAKE 1 TABLET BY MOUTH  DAILY 90 tablet 1  . Multiple Vitamin (MULTIVITAMIN WITH MINERALS) TABS tablet Take 1 tablet by mouth daily. Vitafusion    . omeprazole (PRILOSEC) 20 MG capsule  Take 1 capsule (20 mg total) by mouth daily. 90 capsule 1  . potassium chloride SA (KLOR-CON) 20 MEQ tablet Take 1 tablet (20 mEq total) by mouth daily. 90 tablet 1  . pravastatin (PRAVACHOL) 80 MG tablet Take 1 tablet (80 mg total) by mouth daily. 90 tablet 1  . psyllium (METAMUCIL) 58.6 % powder Take 1 packet by mouth daily.    Marland Kitchen terbinafine (  LAMISIL) 250 MG tablet Take 1 tablet (250 mg total) by mouth daily. 14 tablet 0  . triamcinolone (KENALOG) 0.025 % ointment Apply 1 application topically daily. 30 g 0  . valACYclovir (VALTREX) 1000 MG tablet Take 1 tablet (1,000 mg total) by mouth daily. 90 tablet 1   No current facility-administered medications on file prior to visit.    BP 127/65 (BP Location: Right Arm, Patient Position: Sitting, Cuff Size: Large)   Pulse 82   Temp 98.4 F (36.9 C) (Oral)   Resp 16   Ht 5' 6"  (1.676 m)   Wt (!) 311 lb (141.1 kg)   LMP 11/10/2011   SpO2 98%   BMI 50.20 kg/m       Objective:   Physical Exam Constitutional:      Appearance: She is well-developed.  Cardiovascular:     Rate and Rhythm: Normal rate and regular rhythm.     Heart sounds: Normal heart sounds. No murmur heard.   Pulmonary:     Effort: Pulmonary effort is normal. No respiratory distress.     Breath sounds: Normal breath sounds. No wheezing.  Psychiatric:        Behavior: Behavior normal.        Thought Content: Thought content normal.        Judgment: Judgment normal.           Assessment & Plan:  GERD- stable on PPI. Continue omeprazole.  HTN- bp stable on current regimen. Continue same.  Hyperlipidemia- tolerating statin. LDL nearly at goal. Continue same.  Lab Results  Component Value Date   CHOL 165 07/27/2019   HDL 46.40 07/27/2019   LDLCALC 102 (H) 07/27/2019   TRIG 85.0 07/27/2019   CHOLHDL 4 07/27/2019   Diabetes type 2- clinically stable. Obtain a1c.  Genital herpes- stable on valtrex. Continue same.   Vit D deficiency- check follow up vit D  level.  Morbid obesity- she has not heard back from the bariatric clinic. I advised her to reach back out to them. I commended her on her 4 pounds weight loss and her dietary changes.  This visit occurred during the SARS-CoV-2 public health emergency.  Safety protocols were in place, including screening questions prior to the visit, additional usage of staff PPE, and extensive cleaning of exam room while observing appropriate contact time as indicated for disinfecting solutions.

## 2019-12-14 ENCOUNTER — Encounter: Payer: Self-pay | Admitting: Family

## 2019-12-15 LAB — VITAMIN D 1,25 DIHYDROXY
Vitamin D 1, 25 (OH)2 Total: 56 pg/mL (ref 18–72)
Vitamin D2 1, 25 (OH)2: <8 pg/mL
Vitamin D3 1, 25 (OH)2: 56 pg/mL

## 2019-12-17 ENCOUNTER — Telehealth: Payer: Self-pay | Admitting: *Deleted

## 2019-12-17 NOTE — Telephone Encounter (Signed)
Patient called and stated that she was cleaning up and the pills got thrown away by accident and was calling to see if Dr Jacqualyn Posey could refill again and patient's phone number is [phone]. Lsa

## 2019-12-18 ENCOUNTER — Telehealth: Payer: Self-pay

## 2019-12-19 ENCOUNTER — Telehealth: Payer: Self-pay | Admitting: Podiatry

## 2019-12-19 ENCOUNTER — Other Ambulatory Visit: Payer: Self-pay | Admitting: Podiatry

## 2019-12-19 MED ORDER — TERBINAFINE HCL 250 MG PO TABS: 250.0000 mg | ORAL_TABLET | Freq: Every day | ORAL | 0 refills | Status: DC

## 2019-12-19 NOTE — Telephone Encounter (Signed)
I spoke with pt and she states she thinks she threw the lamisil away after only taking one. I told pt, I would send a message to Dr. Jacqualyn Posey.

## 2019-12-19 NOTE — Telephone Encounter (Signed)
I sent it to the pharmacy

## 2019-12-19 NOTE — Telephone Encounter (Signed)
Hey patient called and stated that she threw away the lamisil pills and was wandering can she get another refill. Thanks Lattie Haw

## 2019-12-19 NOTE — Telephone Encounter (Signed)
sent 

## 2019-12-19 NOTE — Telephone Encounter (Signed)
Pt callled and asked for a call back she just missed the call from nurse she wanted to know if she could get another script for her medication she stated she only took the one pill and she lost the medication please assist

## 2019-12-20 NOTE — Telephone Encounter (Signed)
I informed pt of Dr. Leigh Aurora refill of the terbinafine.

## 2019-12-20 NOTE — Telephone Encounter (Signed)
Called patient today and relayed the message per Dr Wagoner. Jessye Imhoff 

## 2020-01-23 ENCOUNTER — Telehealth: Payer: Self-pay | Admitting: Family

## 2020-01-23 ENCOUNTER — Other Ambulatory Visit: Payer: Self-pay

## 2020-01-23 DIAGNOSIS — I10 Essential (primary) hypertension: Secondary | ICD-10-CM

## 2020-01-23 MED ORDER — POTASSIUM CHLORIDE CRYS ER 20 MEQ PO TBCR: 20.0000 meq | EXTENDED_RELEASE_TABLET | Freq: Every day | ORAL | 1 refills | Status: DC

## 2020-01-23 MED ORDER — FLOVENT HFA 110 MCG/ACT IN AERO: 2.0000 | INHALATION_SPRAY | Freq: Two times a day (BID) | RESPIRATORY_TRACT | 1 refills | Status: DC

## 2020-01-23 NOTE — Telephone Encounter (Signed)
Medication:  potassium chloride SA (KLOR-CON) 20 MEQ tablet [[id number]]    fluticasone (FLOVENT HFA) 110 MCG/ACT inhaler [[id number]]    Has the patient contacted their pharmacy?  (If no, request that the patient contact the pharmacy for the refill.) (If yes, when and what did the pharmacy advise?)     Preferred Pharmacy (with phone number or street name): [facility], [address], [address], [place], [place] [address]  Phone:  [phone] Fax:  [phone]      Agent: Please be advised that RX refills may take up to 3 business days. We ask that you follow-up with your pharmacy.

## 2020-01-25 ENCOUNTER — Telehealth: Payer: Self-pay | Admitting: Nurse Practitioner

## 2020-01-25 DIAGNOSIS — K436 Other and unspecified ventral hernia with obstruction, without gangrene: Secondary | ICD-10-CM | POA: Diagnosis not present

## 2020-01-25 DIAGNOSIS — K8020 Calculus of gallbladder without cholecystitis without obstruction: Secondary | ICD-10-CM | POA: Diagnosis not present

## 2020-01-25 DIAGNOSIS — I10 Essential (primary) hypertension: Secondary | ICD-10-CM | POA: Diagnosis not present

## 2020-01-25 DIAGNOSIS — R7303 Prediabetes: Secondary | ICD-10-CM | POA: Diagnosis not present

## 2020-01-25 DIAGNOSIS — K429 Umbilical hernia without obstruction or gangrene: Secondary | ICD-10-CM | POA: Diagnosis not present

## 2020-01-27 ENCOUNTER — Other Ambulatory Visit: Payer: Self-pay | Admitting: Family

## 2020-01-27 DIAGNOSIS — I10 Essential (primary) hypertension: Secondary | ICD-10-CM

## 2020-01-28 NOTE — Telephone Encounter (Signed)
Contacted the patient in reference to wanting a refill on dicyclomine. She stated that she say Dr Redmond Pulling this past Friday regarding her hernia surgery. He is wanting to have her loose weight before and she feels she needs a refill on this prescription. Patient could not specify what was going on only that she is having stomach pain.

## 2020-01-29 ENCOUNTER — Other Ambulatory Visit (HOSPITAL_COMMUNITY): Payer: Self-pay | Admitting: General Surgery

## 2020-01-29 ENCOUNTER — Other Ambulatory Visit: Payer: Self-pay | Admitting: General Surgery

## 2020-02-06 ENCOUNTER — Other Ambulatory Visit: Payer: Self-pay | Admitting: Family

## 2020-02-06 DIAGNOSIS — I10 Essential (primary) hypertension: Secondary | ICD-10-CM

## 2020-02-07 ENCOUNTER — Other Ambulatory Visit: Payer: Self-pay | Admitting: Family

## 2020-02-07 DIAGNOSIS — H52203 Unspecified astigmatism, bilateral: Secondary | ICD-10-CM | POA: Diagnosis not present

## 2020-02-07 DIAGNOSIS — E119 Type 2 diabetes mellitus without complications: Secondary | ICD-10-CM | POA: Diagnosis not present

## 2020-02-07 DIAGNOSIS — H2513 Age-related nuclear cataract, bilateral: Secondary | ICD-10-CM | POA: Diagnosis not present

## 2020-02-07 DIAGNOSIS — H5201 Hypermetropia, right eye: Secondary | ICD-10-CM | POA: Diagnosis not present

## 2020-02-07 DIAGNOSIS — H524 Presbyopia: Secondary | ICD-10-CM | POA: Diagnosis not present

## 2020-02-13 ENCOUNTER — Ambulatory Visit (HOSPITAL_COMMUNITY)
Admission: RE | Admit: 2020-02-13 | Discharge: 2020-02-13 | Disposition: A | Discharge status: Home / Self Care | Payer: Medicare Other | Source: Ambulatory Visit | Attending: General Surgery | Admitting: General Surgery

## 2020-02-13 ENCOUNTER — Other Ambulatory Visit: Payer: Self-pay

## 2020-02-13 DIAGNOSIS — Z9884 Bariatric surgery status: Secondary | ICD-10-CM | POA: Diagnosis not present

## 2020-02-13 DIAGNOSIS — Z01818 Encounter for other preprocedural examination: Secondary | ICD-10-CM | POA: Diagnosis not present

## 2020-02-20 ENCOUNTER — Encounter: Payer: Self-pay | Admitting: Dietician

## 2020-02-20 ENCOUNTER — Other Ambulatory Visit: Payer: Self-pay

## 2020-02-20 ENCOUNTER — Encounter: Payer: Medicare Other | Attending: General Surgery | Admitting: Dietician

## 2020-02-20 DIAGNOSIS — R7303 Prediabetes: Secondary | ICD-10-CM | POA: Diagnosis not present

## 2020-02-20 DIAGNOSIS — K429 Umbilical hernia without obstruction or gangrene: Secondary | ICD-10-CM | POA: Insufficient documentation

## 2020-02-20 DIAGNOSIS — Z713 Dietary counseling and surveillance: Secondary | ICD-10-CM | POA: Diagnosis not present

## 2020-02-20 DIAGNOSIS — K8020 Calculus of gallbladder without cholecystitis without obstruction: Secondary | ICD-10-CM | POA: Diagnosis not present

## 2020-02-20 DIAGNOSIS — I10 Essential (primary) hypertension: Secondary | ICD-10-CM | POA: Diagnosis not present

## 2020-02-20 DIAGNOSIS — E7800 Pure hypercholesterolemia, unspecified: Secondary | ICD-10-CM | POA: Diagnosis not present

## 2020-02-20 DIAGNOSIS — K436 Other and unspecified ventral hernia with obstruction, without gangrene: Secondary | ICD-10-CM | POA: Insufficient documentation

## 2020-02-20 DIAGNOSIS — E669 Obesity, unspecified: Secondary | ICD-10-CM

## 2020-02-20 NOTE — Progress Notes (Signed)
Nutrition Assessment for Bariatric Surgery Medical Nutrition Therapy   Patient was seen on 02/20/2020 for Pre-Operative Nutrition Assessment. Letter of approval faxed to [facility] bariatric surgery program coordinator on 02/20/2020.   Referral stated Supervised Weight Loss (SWL) visits needed: 6  Planned surgery: Sleeve Pt expectation of surgery: weight loss    NUTRITION ASSESSMENT   Anthropometrics  Start weight at [facility]: 312 lbs (date: 02/20/2020) Height: 61.5 in BMI: 58 kg/m2     Lifestyle & Dietary Hx States doctor recommended surgery to her. Currently lives alone so pt does the food shopping and cooking, states she rarely eats out. Typical meal pattern is 3 meals per day plus sometimes a snack/dessert in the evening. Drinks primarily water.   Any previous deficiencies: no  Micronutrient Nutrition Focused Physical Exam: Hair: no issues observed Eyes: no issues observed Mouth: no issues observed Neck: no issues observed Nails: no issues observed Skin: no issues observed  24-Hr Dietary Recall First Meal: egg + oatmeal  Snack: - Second Meal: [place] sandwich  Snack: - Third Meal: air fried meat + roasted red potatoes + green beans Snack: cheesecake  Beverages: water, sparkling water   NUTRITION DIAGNOSIS  Overweight/obesity ([place]-3.3) related to past poor dietary habits and physical inactivity as evidenced by patient w/ planned Sleeve Gastrectomy surgery following dietary guidelines for continued weight loss.    NUTRITION INTERVENTION  Nutrition counseling (C-1) and education (E-2) to facilitate bariatric surgery goals.  Pre-Op Goals Reviewed with the Patient . Track food and beverage intake (pen and paper, MyFitness Pal, Baritastic app, etc.) . Make healthy food choices while monitoring portion sizes . Consume 3 meals per day or try to eat every 3-5 hours . Avoid concentrated sugars and fried foods . Keep sugar & fat in the single digits per serving on  food labels . Practice CHEWING your food (aim for applesauce consistency) . Practice not drinking 15 minutes before, during, and 30 minutes after each meal and snack . Avoid all carbonated beverages (ex: soda, sparkling beverages)  . Limit caffeinated beverages (ex: coffee, tea, energy drinks) . Avoid all sugar-sweetened beverages (ex: regular soda, sports drinks)  . Avoid alcohol  . Aim for 64-100 ounces of FLUID daily (with at least half of fluid intake being plain water)  . Aim for at least 60-80 grams of PROTEIN daily . Look for a liquid protein source that contains ?15 g protein and ?5 g carbohydrate (ex: shakes, drinks, shots) . Make a list of non-food related activities . Physical activity is an important part of a healthy lifestyle so keep it moving! The goal is to reach 150 minutes of exercise per week, including cardiovascular and weight baring activity.  *Goals that are bolded indicate the pt would like to start working towards these  Handouts Provided Include  . Bariatric Surgery handouts (Nutrition Visits, Pre-Op Goals, Protein Shakes, Vitamins & Minerals)  Learning Style & Readiness for Change Teaching method utilized: Visual & Auditory  Demonstrated degree of understanding via: Teach Back  Barriers to learning/adherence to lifestyle change: None Identified    MONITORING & EVALUATION Dietary intake, weekly physical activity, body weight, and pre-op goals reached at next nutrition visit.   Next Steps Patient is to return to [facility] in 1 month for 1st SWL Visit.

## 2020-02-20 NOTE — Patient Instructions (Addendum)
It was nice meeting you today! Begin working through the [employer] discussed today, starting with the following:  . Look for a liquid protein source that contains ?15 g protein and ?5 g carbohydrate (ex: shakes, drinks, shots)

## 2020-02-24 NOTE — Progress Notes (Signed)
Cardiology Office Note:    Date:  02/26/2020   ID:  Kyann, Heydt 27-Dec-1955, MRN [id number]  PCP:  Debbrah Alar, NP  Cardiologist:  No primary care provider on file.  Electrophysiologist:  None   Referring MD: [name], MD   Chief Complaint  Patient presents with  . Pre-op Exam    History of Present Illness:    Leah Hampton is a 64 y.o. female with a hx of T2DM, hypertension, hyperlipidemia, OSA, morbid obesity who is referred by Dr. Redmond Pulling for preop evaluation prior to bariatric surgery.  She denies any chest pain but reports occasional dyspnea on exertion.  States that she has to walk up a flight of stairs in her home multiple times per day (12 steps).  She is able to walk up a flight of stairs without any chest pain or dyspnea.  She denies any lightheadedness, syncope, or palpitations.  Does report intermittent lower extremity edema.  No smoking history.  Father died of MI in 35s.   Past Medical History:  Diagnosis Date  . Bronchitis   . Cystitis   . Diabetes type 2, controlled ([place])   . Hematuria    urologice eval, Dr Estill Dooms  . History of chest pain   . Hyperbilirubinemia   . Hyperlipemia   . Hypertelorism   . Hypertension   . Leukocytosis   . Obesity   . Osteoarthritis    right knee  . Sleep apnea 2012   slight   . Vaginal Pap smear, abnormal   . Vitamin D deficiency 02/09/2018    Past Surgical History:  Procedure Laterality Date  . COLONOSCOPY WITH PROPOFOL N/A 03/27/2015   Procedure: COLONOSCOPY WITH PROPOFOL;  Surgeon: Milus Banister, MD;  Location: [facility];  Service: Endoscopy;  Laterality: N/A;  . DILATATION & CURETTAGE/HYSTEROSCOPY WITH MYOSURE N/A 01/05/2017   Procedure: DILATATION & CURETTAGE/HYSTEROSCOPY WITH MYOSURE;  Surgeon: Thurnell Lose, MD;  Location: [facility];  Service: Gynecology;  Laterality: N/A;  PostMenopausal Bleeding  . HYSTEROSCOPY WITH D & C N/A 05/22/2019   Procedure: DILATATION AND CURETTAGE /HYSTEROSCOPY,  POLYPECTOMY WITH MYOSURE;  Surgeon: Lavonia Drafts, MD;  Location: [facility];  Service: Gynecology;  Laterality: N/A;  . KNEE ARTHROSCOPY  04/2007   left knee  . TOTAL KNEE ARTHROPLASTY  10/2008   Dr Roxine Caddy    Current Medications: Current Meds  Medication Sig  . albuterol (VENTOLIN HFA) 108 (90 Base) MCG/ACT inhaler Inhale 2 puffs into the lungs every 6 (six) hours as needed for wheezing or shortness of breath.  Marland Kitchen amoxicillin (AMOXIL) 500 MG tablet SMARTSIG:4 Tablet(s) By Mouth  . Ciclopirox 0.77 % gel Apply 1 application topically 2 (two) times daily.  . fluticasone (FLOVENT HFA) 110 MCG/ACT inhaler Inhale 2 puffs into the lungs 2 (two) times daily.  Marland Kitchen losartan-hydrochlorothiazide (HYZAAR) 100-25 MG tablet TAKE 1 TABLET BY MOUTH  DAILY  . Multiple Vitamin (MULTIVITAMIN WITH MINERALS) TABS tablet Take 1 tablet by mouth daily. Vitafusion  . omeprazole (PRILOSEC) 20 MG capsule TAKE 1 CAPSULE BY MOUTH  DAILY  . potassium chloride SA (KLOR-CON) 20 MEQ tablet TAKE 1 TABLET(20 MEQ) BY MOUTH DAILY  . pravastatin (PRAVACHOL) 80 MG tablet TAKE 1 TABLET BY MOUTH  DAILY  . psyllium (METAMUCIL) 58.6 % powder Take 1 packet by mouth daily.  . valACYclovir (VALTREX) 1000 MG tablet TAKE 1 TABLET BY MOUTH  DAILY     Allergies:   Sulfamethoxazole-trimethoprim [bactrim] and Codeine   Social History   Socioeconomic  History  . Marital status: Single    Spouse name: Not on file  . Number of children: 2  . Years of education: Not on file  . Highest education level: Not on file  Occupational History  . Occupation: disabled  Tobacco Use  . Smoking status: Never Smoker  . Smokeless tobacco: Never Used  Vaping Use  . Vaping Use: Never used  Substance and Sexual Activity  . Alcohol use: No  . Drug use: No  . Sexual activity: Not Currently  Other Topics Concern  . Not on file  Social History Narrative   Regular exercise:  No   Lives alone   Has 2 grown sons, both local.  1 grandson   On  disability due to her knee pain   Completed 12th grade   Enjoys television   No pets.    Social Determinants of Health   Financial Resource Strain: Low Risk   . Difficulty of Paying Living Expenses: Not hard at all  Food Insecurity: No Food Insecurity  . Worried About Charity fundraiser in the Last Year: Never true  . Ran Out of Food in the Last Year: Never true  Transportation Needs: No Transportation Needs  . Lack of Transportation (Medical): No  . Lack of Transportation (Non-Medical): No  Physical Activity:   . Days of Exercise per Week: Not on file  . Minutes of Exercise per Session: Not on file  Stress:   . Feeling of Stress : Not on file  Social Connections:   . Frequency of Communication with Friends and Family: Not on file  . Frequency of Social Gatherings with Friends and Family: Not on file  . Attends Religious Services: Not on file  . Active Member of Clubs or Organizations: Not on file  . Attends Archivist Meetings: Not on file  . Marital Status: Not on file     Family History: The patient's family history includes Alcohol abuse in her brother; Arthritis in her mother and another family member; CAD (age of onset: 71) in her sister; CVA in her sister; Cirrhosis in her brother; Coronary artery disease in an other family member; Diabetes in an other family member; Diabetes Mellitus II in her mother and sister; Heart attack (age of onset: 2) in her father; Heart failure in her mother; Hypertension in her mother and sister; Stroke in her mother and another family member. There is no history of Colon cancer.  ROS:   Please see the history of present illness.     All other systems reviewed and are negative.  EKGs/Labs/Other Studies Reviewed:    The following studies were reviewed today:   EKG:  EKG is ordered today.  The ekg ordered today demonstrates normal sinus rhythm, rate 87, no ST abnormalities  Recent Labs: 08/23/2019: Hemoglobin 12.3; Platelets  298 12/12/2019: ALT 15; BUN 18; Creatinine, Ser 1.01; Potassium 3.5; Sodium 139  Recent Lipid Panel    Component Value Date/Time   CHOL 165 07/27/2019 0952   CHOL 171 05/23/2017 1142   TRIG 85.0 07/27/2019 0952   HDL 46.40 07/27/2019 0952   HDL 55 05/23/2017 1142   CHOLHDL 4 07/27/2019 0952   VLDL 17.0 07/27/2019 0952   LDLCALC 102 (H) 07/27/2019 0952   LDLCALC 104 (H) 05/23/2017 1142    Physical Exam:    VS:  BP 130/70   Pulse 87   Ht 5\' 2"  (1.575 m)   Wt (!) 312 lb (141.5 kg)   LMP 11/10/2011  BMI 57.07 kg/m     Wt Readings from Last 3 Encounters:  02/26/20 (!) 312 lb (141.5 kg)  02/20/20 (!) 312 lb (141.5 kg)  12/12/19 (!) 311 lb (141.1 kg)     GEN:  in no acute distress HEENT: Normal NECK: No JVD; No carotid bruits LYMPHATICS: No lymphadenopathy CARDIAC: RRR, no murmurs, rubs, gallops RESPIRATORY:  Clear to auscultation without rales, wheezing or rhonchi  ABDOMEN: Soft, non-tender, non-distended MUSCULOSKELETAL: Mild lower extremity edema SKIN: Warm and dry NEUROLOGIC:  Alert and oriented x 3 PSYCHIATRIC:  Normal affect   ASSESSMENT:    1. Shortness of breath   2. Essential hypertension   3. Hyperlipidemia, unspecified hyperlipidemia type   4. Morbid obesity ([place])   5. Lower extremity edema    PLAN:    Preop evaluation: Prior to bariatric surgery.  Functional capacity appears greater than 4 METS, as reports he can walk up flight of stairs without symptoms.  RCRI score 1 given intraperitoneal surgery.  Given mild lower extremity edema and does report intermittent dyspnea, will plan echocardiogram as below to rule out cardiac etiology.  If unremarkable, no further cardiac work-up planned.  Lower extremity edema: Mild, suspect related to obesity.  Will check echocardiogram to rule out cardiac cause  Hypertension: On losartan-HCTZ 100-25 mg.  Appears controlled  Hyperlipidemia: On pravastatin 80 mg daily.  LDL 102 on 07/27/2019.  Discussed calcium score to  guide aggressiveness in lowering cholesterol, she states that she would like to hold off at this time  T2DM: A1c 6.5 on 12/12/2019  Morbid obesity:Body mass index is 57.07 kg/m.  Being evaluated for bariatric surgery.  RTC in 3 months   Medication Adjustments/Labs and Tests Ordered: Current medicines are reviewed at length with the patient today.  Concerns regarding medicines are outlined above.  Orders Placed This Encounter  Procedures  . EKG 12-Lead  . ECHOCARDIOGRAM COMPLETE   No orders of the defined types were placed in this encounter.   Patient Instructions  Medication Instructions:  Your physician recommends that you continue on your current medications as directed. Please refer to the Current Medication list given to you today.  Testing/Procedures: Your physician has requested that you have an echocardiogram. Echocardiography is a painless test that uses sound waves to create images of your heart. It provides your doctor with information about the size and shape of your heart and how well your heart's chambers and valves are working. This procedure takes approximately one hour. There are no restrictions for this procedure.  This will be done at our [facility] location:  [place]: At [facility], you and your health needs are our priority.  As part of our continuing mission to provide you with exceptional heart care, we have created designated Provider Care Teams.  These Care Teams include your primary Cardiologist (physician) and Advanced Practice Providers (APPs -  Physician Assistants and Nurse Practitioners) who all work together to provide you with the care you need, when you need it.  We recommend signing up for the patient portal called "MyChart".  Sign up information is provided on this After Visit Summary.  MyChart is used to connect with patients for Virtual Visits (Telemedicine).  Patients are able to view lab/test results,  encounter notes, upcoming appointments, etc.  Non-urgent messages can be sent to your provider as well.   To learn more about what you can do with MyChart, go to [email or web address].    Your next appointment:  3 month(s)  The format for your next appointment:   In Person  Provider:   Oswaldo Milian, MD      Signed, Donato Heinz, MD  02/26/2020 2:20 PM    [place]

## 2020-02-26 ENCOUNTER — Other Ambulatory Visit: Payer: Self-pay

## 2020-02-26 ENCOUNTER — Ambulatory Visit (INDEPENDENT_AMBULATORY_CARE_PROVIDER_SITE_OTHER): Payer: Medicare Other | Admitting: Cardiology

## 2020-02-26 ENCOUNTER — Encounter: Payer: Self-pay | Admitting: Cardiology

## 2020-02-26 VITALS — BP 130/70 | HR 87 | Ht 62.0 in | Wt 312.0 lb

## 2020-02-26 DIAGNOSIS — E785 Hyperlipidemia, unspecified: Secondary | ICD-10-CM | POA: Diagnosis not present

## 2020-02-26 DIAGNOSIS — R0602 Shortness of breath: Secondary | ICD-10-CM

## 2020-02-26 DIAGNOSIS — R600 Localized edema: Secondary | ICD-10-CM

## 2020-02-26 DIAGNOSIS — I10 Essential (primary) hypertension: Secondary | ICD-10-CM | POA: Diagnosis not present

## 2020-02-26 NOTE — Patient Instructions (Signed)
Medication Instructions:  Your physician recommends that you continue on your current medications as directed. Please refer to the Current Medication list given to you today.  Testing/Procedures: Your physician has requested that you have an echocardiogram. Echocardiography is a painless test that uses sound waves to create images of your heart. It provides your doctor with information about the size and shape of your heart and how well your heart's chambers and valves are working. This procedure takes approximately one hour. There are no restrictions for this procedure. This will be done at our [address] location:  [address]  Follow-Up: At [facility], you and your health needs are our priority.  As part of our continuing mission to provide you with exceptional heart care, we have created designated Provider Care Teams.  These Care Teams include your primary Cardiologist (physician) and Advanced Practice Providers (APPs -  Physician Assistants and Nurse Practitioners) who all work together to provide you with the care you need, when you need it.  We recommend signing up for the patient portal called "MyChart".  Sign up information is provided on this After Visit Summary.  MyChart is used to connect with patients for Virtual Visits (Telemedicine).  Patients are able to view lab/test results, encounter notes, upcoming appointments, etc.  Non-urgent messages can be sent to your provider as well.   To learn more about what you can do with MyChart, go to [email or web address].    Your next appointment:   3 month(s)  The format for your next appointment:   In Person  Provider:   Christopher Schumann, MD   

## 2020-03-14 ENCOUNTER — Ambulatory Visit: Payer: Medicare Other | Attending: Internal Medicine

## 2020-03-14 ENCOUNTER — Other Ambulatory Visit (HOSPITAL_BASED_OUTPATIENT_CLINIC_OR_DEPARTMENT_OTHER): Payer: Self-pay | Admitting: Internal Medicine

## 2020-03-14 ENCOUNTER — Other Ambulatory Visit: Payer: Self-pay

## 2020-03-14 ENCOUNTER — Ambulatory Visit (INDEPENDENT_AMBULATORY_CARE_PROVIDER_SITE_OTHER): Payer: Medicare Other | Admitting: Family

## 2020-03-14 VITALS — BP 132/59 | HR 75 | Temp 98.1°F | Resp 16 | Ht 62.0 in | Wt 312.0 lb

## 2020-03-14 DIAGNOSIS — E782 Mixed hyperlipidemia: Secondary | ICD-10-CM

## 2020-03-14 DIAGNOSIS — I10 Essential (primary) hypertension: Secondary | ICD-10-CM

## 2020-03-14 DIAGNOSIS — Z23 Encounter for immunization: Secondary | ICD-10-CM

## 2020-03-14 DIAGNOSIS — E119 Type 2 diabetes mellitus without complications: Secondary | ICD-10-CM

## 2020-03-14 DIAGNOSIS — K219 Gastro-esophageal reflux disease without esophagitis: Secondary | ICD-10-CM

## 2020-03-14 MED ORDER — LOSARTAN POTASSIUM-HCTZ 100-25 MG PO TABS: 1.0000 | ORAL_TABLET | Freq: Every day | ORAL | 1 refills | Status: DC

## 2020-03-14 MED ORDER — ALBUTEROL SULFATE HFA 108 (90 BASE) MCG/ACT IN AERS: 2.0000 | INHALATION_SPRAY | Freq: Four times a day (QID) | RESPIRATORY_TRACT | 1 refills | Status: DC | PRN

## 2020-03-14 MED ORDER — PRAVASTATIN SODIUM 80 MG PO TABS: 80.0000 mg | ORAL_TABLET | Freq: Every day | ORAL | 1 refills | Status: DC

## 2020-03-14 MED ORDER — OMEPRAZOLE 20 MG PO CPDR: 20.0000 mg | DELAYED_RELEASE_CAPSULE | Freq: Every day | ORAL | 1 refills | Status: DC

## 2020-03-14 MED ORDER — TENIVAC 5-2 LFU IM INJ: 0.5000 mL | INJECTION | Freq: Once | INTRAMUSCULAR | 0 refills | Status: AC

## 2020-03-14 MED ORDER — POTASSIUM CHLORIDE CRYS ER 20 MEQ PO TBCR: EXTENDED_RELEASE_TABLET | ORAL | 1 refills | Status: DC

## 2020-03-14 NOTE — Patient Instructions (Signed)
Please complete lab work prior to leaving.   

## 2020-03-14 NOTE — Progress Notes (Signed)
Subjective:    Patient ID: Leah Hampton, female    DOB: March 18, 1956, 64 y.o.   MRN: [id number]  HPI  Patient is a 64 yr old female who presents today for follow up.  HTN- maintained on hyzaar 100-25.    BP Readings from Last 3 Encounters:  03/14/20 (!) 132/59  02/26/20 130/70  12/12/19 127/65   Hyperlipidemia- maintaine don pravastatin 65m.  Lab Results  Component Value Date   CHOL 165 07/27/2019   HDL 46.40 07/27/2019   LDLCALC 102 (H) 07/27/2019   TRIG 85.0 07/27/2019   CHOLHDL 4 07/27/2019    GERD- maintained on omeprazole 274m  Denies gerd symptoms.   Asthma- reports occasional wheezing/cough.   DM2- maintained on hyzaar 100-2524m   Not checking sugars at home.   Lab Results  Component Value Date   HGBA1C 6.5 12/12/2019   HGBA1C 6.6 (H) 07/27/2019   HGBA1C 6.5 05/08/2019   Lab Results  Component Value Date   MICROALBUR 0.97 07/13/2011   LDLCALC 102 (H) 07/27/2019   CREATININE 1.01 12/12/2019   VIt D deficiency-    Review of Systems See HPI  Past Medical History:  Diagnosis Date  . Bronchitis   . Cystitis   . Diabetes type 2, controlled ([place] . Hematuria    urologice eval, Dr EskEstill Dooms History of chest pain   . Hyperbilirubinemia   . Hyperlipemia   . Hypertelorism   . Hypertension   . Leukocytosis   . Obesity   . Osteoarthritis    right knee  . Sleep apnea 2012   slight   . Vaginal Pap smear, abnormal   . Vitamin D deficiency 02/09/2018     Social History   Socioeconomic History  . Marital status: Single    Spouse name: Not on file  . Number of children: 2  . Years of education: Not on file  . Highest education level: Not on file  Occupational History  . Occupation: disabled  Tobacco Use  . Smoking status: Never Smoker  . Smokeless tobacco: Never Used  Vaping Use  . Vaping Use: Never used  Substance and Sexual Activity  . Alcohol use: No  . Drug use: No  . Sexual activity: Not Currently  Other Topics Concern  . Not  on file  Social History Narrative   Regular exercise:  No   Lives alone   Has 2 grown sons, both local.  1 grandson   On disability due to her knee pain   Completed 12th grade   Enjoys television   No pets.    Social Determinants of Health   Financial Resource Strain: Low Risk   . Difficulty of Paying Living Expenses: Not hard at all  Food Insecurity: No Food Insecurity  . Worried About RunCharity fundraiser the Last Year: Never true  . Ran Out of Food in the Last Year: Never true  Transportation Needs: No Transportation Needs  . Lack of Transportation (Medical): No  . Lack of Transportation (Non-Medical): No  Physical Activity:   . Days of Exercise per Week: Not on file  . Minutes of Exercise per Session: Not on file  Stress:   . Feeling of Stress : Not on file  Social Connections:   . Frequency of Communication with Friends and Family: Not on file  . Frequency of Social Gatherings with Friends and Family: Not on file  . Attends Religious Services: Not on file  . Active Member  of Clubs or Organizations: Not on file  . Attends Archivist Meetings: Not on file  . Marital Status: Not on file  Intimate Partner Violence:   . Fear of Current or Ex-Partner: Not on file  . Emotionally Abused: Not on file  . Physically Abused: Not on file  . Sexually Abused: Not on file    Past Surgical History:  Procedure Laterality Date  . COLONOSCOPY WITH PROPOFOL N/A 03/27/2015   Procedure: COLONOSCOPY WITH PROPOFOL;  Surgeon: Milus Banister, MD;  Location: [facility];  Service: Endoscopy;  Laterality: N/A;  . DILATATION & CURETTAGE/HYSTEROSCOPY WITH MYOSURE N/A 01/05/2017   Procedure: DILATATION & CURETTAGE/HYSTEROSCOPY WITH MYOSURE;  Surgeon: Thurnell Lose, MD;  Location: [facility];  Service: Gynecology;  Laterality: N/A;  PostMenopausal Bleeding  . HYSTEROSCOPY WITH D & C N/A 05/22/2019   Procedure: DILATATION AND CURETTAGE /HYSTEROSCOPY, POLYPECTOMY WITH MYOSURE;  Surgeon:  Lavonia Drafts, MD;  Location: [facility];  Service: Gynecology;  Laterality: N/A;  . KNEE ARTHROSCOPY  04/2007   left knee  . TOTAL KNEE ARTHROPLASTY  10/2008   Dr Roxine Caddy    Family History  Problem Relation Age of Onset  . Heart failure Mother   . Arthritis Mother   . Hypertension Mother   . Stroke Mother   . Diabetes Mellitus II Mother   . Heart attack Father 77  . Diabetes Other   . Arthritis Other   . Stroke Other   . Coronary artery disease Other   . Hypertension Sister        x 2  . CVA Sister   . CAD Sister 69  . Diabetes Mellitus II Sister   . Cirrhosis Brother   . Alcohol abuse Brother   . Colon cancer Neg Hx     Allergies  Allergen Reactions  . Sulfamethoxazole-Trimethoprim [Bactrim] Itching  . Codeine Nausea And Vomiting and Anxiety    Current Outpatient Medications on File Prior to Visit  Medication Sig Dispense Refill  . albuterol (VENTOLIN HFA) 108 (90 Base) MCG/ACT inhaler Inhale 2 puffs into the lungs every 6 (six) hours as needed for wheezing or shortness of breath. 18 g 1  . amoxicillin (AMOXIL) 500 MG tablet SMARTSIG:4 Tablet(s) By Mouth    . blood glucose meter kit and supplies KIT Dispense based on patient and insurance preference. Use up to four times daily as directed. (FOR ICD-9 250.00, 250.01). 1 each 0  . Ciclopirox 0.77 % gel Apply 1 application topically 2 (two) times daily. 45 g 0  . fluticasone (FLOVENT HFA) 110 MCG/ACT inhaler Inhale 2 puffs into the lungs 2 (two) times daily. 36 g 1  . losartan-hydrochlorothiazide (HYZAAR) 100-25 MG tablet TAKE 1 TABLET BY MOUTH  DAILY 90 tablet 1  . Multiple Vitamin (MULTIVITAMIN WITH MINERALS) TABS tablet Take 1 tablet by mouth daily. Vitafusion    . omeprazole (PRILOSEC) 20 MG capsule TAKE 1 CAPSULE BY MOUTH  DAILY 90 capsule 1  . potassium chloride SA (KLOR-CON) 20 MEQ tablet TAKE 1 TABLET(20 MEQ) BY MOUTH DAILY 90 tablet 1  . pravastatin (PRAVACHOL) 80 MG tablet TAKE 1 TABLET BY MOUTH  DAILY 90  tablet 1  . psyllium (METAMUCIL) 58.6 % powder Take 1 packet by mouth daily.    . valACYclovir (VALTREX) 1000 MG tablet TAKE 1 TABLET BY MOUTH  DAILY 90 tablet 1   No current facility-administered medications on file prior to visit.    LMP 11/10/2011       Objective:  Physical Exam Constitutional:      Appearance: She is well-developed.  Cardiovascular:     Rate and Rhythm: Normal rate and regular rhythm.     Heart sounds: Normal heart sounds. No murmur heard.   Pulmonary:     Effort: Pulmonary effort is normal. No respiratory distress.     Breath sounds: Normal breath sounds. No wheezing.  Psychiatric:        Behavior: Behavior normal.        Thought Content: Thought content normal.        Judgment: Judgment normal.           Assessment & Plan:  HTN- bp stable. Continue hyzaar 100-25.    DM2- stable. Obtain follow up A1C.  Asthma- stable. She reports that flovent sometimes gives her a headache. Advised pt OK to hold flovent for a few days to see if this helps. She will let me know.  GERD- stable on omeprazole 40m. Continue same.  Hyperlipidemia- LDL at goal. Continue pravastatin 813m  This visit occurred during the SARS-CoV-2 public health emergency.  Safety protocols were in place, including screening questions prior to the visit, additional usage of staff PPE, and extensive cleaning of exam room while observing appropriate contact time as indicated for disinfecting solutions.

## 2020-03-15 ENCOUNTER — Encounter: Payer: Self-pay | Admitting: Family

## 2020-03-15 LAB — COMPREHENSIVE METABOLIC PANEL
AG Ratio: 1.2 (calc) (ref 1.0–2.5)
ALT: 18 U/L (ref 6–29)
AST: 19 U/L (ref 10–35)
Albumin: 3.8 g/dL (ref 3.6–5.1)
Alkaline phosphatase (APISO): 72 U/L (ref 37–153)
BUN: 15 mg/dL (ref 7–25)
CO2: 26 mmol/L (ref 20–32)
Calcium: 9.2 mg/dL (ref 8.6–10.4)
Chloride: 101 mmol/L (ref 98–110)
Creat: 0.9 mg/dL (ref 0.50–0.99)
Globulin: 3.1 g/dL (calc) (ref 1.9–3.7)
Glucose, Bld: 89 mg/dL (ref 65–99)
Potassium: 3.7 mmol/L (ref 3.5–5.3)
Sodium: 140 mmol/L (ref 135–146)
Total Bilirubin: 0.4 mg/dL (ref 0.2–1.2)
Total Protein: 6.9 g/dL (ref 6.1–8.1)

## 2020-03-15 LAB — HEMOGLOBIN A1C
Hgb A1c MFr Bld: 6.6 % of total Hgb — ABNORMAL HIGH (ref <5.7)
Mean Plasma Glucose: 143 (calc)
eAG (mmol/L): 7.9 (calc)

## 2020-03-15 LAB — SPECIMEN COMPROMISED

## 2020-03-17 NOTE — Progress Notes (Signed)
Mailed out to pt 

## 2020-03-18 ENCOUNTER — Other Ambulatory Visit: Payer: Self-pay

## 2020-03-18 ENCOUNTER — Telehealth: Payer: Self-pay

## 2020-03-18 ENCOUNTER — Ambulatory Visit (HOSPITAL_COMMUNITY): Payer: Medicare Other | Attending: Cardiovascular Disease

## 2020-03-18 DIAGNOSIS — R0602 Shortness of breath: Secondary | ICD-10-CM

## 2020-03-18 LAB — ECHOCARDIOGRAM COMPLETE
Area-P 1/2: 3.74 cm2
S' Lateral: 3 cm

## 2020-03-18 NOTE — Telephone Encounter (Signed)
Spoke with Jenny Reichmann at [facility]  With clarification order #[id number] and gave verbal clarification for alternative of Proair HFA 90 MCG  Inhaler with 90 day supply

## 2020-03-19 ENCOUNTER — Other Ambulatory Visit: Payer: Self-pay

## 2020-03-20 ENCOUNTER — Telehealth: Payer: Self-pay | Admitting: Cardiology

## 2020-03-20 ENCOUNTER — Ambulatory Visit: Payer: Medicare Other | Admitting: Dietician

## 2020-03-20 NOTE — Telephone Encounter (Signed)
Patient returning Hayley's call in regards to echo results.

## 2020-03-20 NOTE — Telephone Encounter (Signed)
Left message for patient to call back  

## 2020-03-20 NOTE — Telephone Encounter (Signed)
Spoke with patient and reviewed echo results. Patient verbalized understanding.

## 2020-03-21 MED FILL — PFIZER-BIONTECH COVID-19 VA: 30 | 1 days supply | Qty: 0 | Fill #0

## 2020-03-28 ENCOUNTER — Encounter: Payer: Medicare Other | Attending: General Surgery | Admitting: Dietician

## 2020-03-28 ENCOUNTER — Encounter: Payer: Self-pay | Admitting: Dietician

## 2020-03-28 ENCOUNTER — Other Ambulatory Visit: Payer: Self-pay

## 2020-03-28 DIAGNOSIS — Z713 Dietary counseling and surveillance: Secondary | ICD-10-CM | POA: Diagnosis not present

## 2020-03-28 DIAGNOSIS — E669 Obesity, unspecified: Secondary | ICD-10-CM

## 2020-03-28 DIAGNOSIS — K436 Other and unspecified ventral hernia with obstruction, without gangrene: Secondary | ICD-10-CM | POA: Diagnosis not present

## 2020-03-28 DIAGNOSIS — Z6841 Body Mass Index (BMI) 40.0 and over, adult: Secondary | ICD-10-CM | POA: Diagnosis not present

## 2020-03-28 NOTE — Patient Instructions (Signed)
·   Great job with keeping track of what you eat! Continue to try eating at least 3 times per day. Good job of finding a protein shake you like as well!  Aim to drink at least 64 ounces of fluid every day.   Use the MyPlate and Meal Ideas sheet for new dinner ideas and try to have the vegetables take up about half of your meal.

## 2020-03-28 NOTE — Progress Notes (Signed)
Supervised Weight Loss Visit Bariatric Nutrition Education  Planned Surgery: Sleeve  Pt Expectation of Surgery/ Goals: weight loss  1 out of 6 SWL Appointments    NUTRITION ASSESSMENT  Anthropometrics  Start weight at [facility]: 312 lbs (date: 02/20/2020) Today's weight: 308.5 lbs BMI: 56.4 kg/m2    Lifestyle & Dietary Hx Patient states she bought Atkins protein shakes to try and likes these. Patient brought her food journal in today, has been tracking what she eats daily since previous nutrition visit. States this has been helpful to her and she would like to continue doing this. We also discussed tracking her beverages as well. Typical meal pattern is 2-3 meals per day, sometimes does not have lunch. Meeting protein goal.   Estimated daily fluid intake: 32 oz Current average weekly physical activity: unknown  24-Hr Dietary Recall First Meal: oatmeal + strawberries Snack: - Second Meal: [place] sandwich w/ cheese and mayo Snack: - Third Meal: BBQ wings + roasted potato + green bean  Snack: - Beverages: water, hot tea w/ honey    NUTRITION DIAGNOSIS  Overweight/obesity ([place]-3.3) related to past poor dietary habits and physical inactivity as evidenced by patient w/ planned Sleeve surgery following dietary guidelines for continued weight loss.   NUTRITION INTERVENTION  Nutrition counseling (C-1) and education (E-2) to facilitate bariatric surgery goals.  Pre-Op Goals Progress & New Goals . Working on eating at least 3 times per day  . Tracking food intake each day  . Found appropriate protein shakes  . Meeting daily protein goal . NEW: Aim to drink at least 64 ounces of fluid daily  . NEW: Include 3 things with lunch/dinner: veggies (1/2 of meal), carbs, and protein   Handouts Provided Include   MyPlate & Meal Ideas  Learning Style & Readiness for Change Teaching method utilized: Visual & Auditory  Demonstrated degree of understanding via: Teach Back  Barriers to  learning/adherence to lifestyle change: None Identified   MONITORING & EVALUATION Dietary intake, weekly physical activity, body weight, and pre-op goals in 1 month.   Next Steps  Patient is to return to [facility] in 1 month for 2nd SWL visit.

## 2020-04-23 ENCOUNTER — Encounter: Payer: Self-pay | Admitting: Gastroenterology

## 2020-04-25 ENCOUNTER — Encounter: Payer: Self-pay | Admitting: Dietician

## 2020-04-25 ENCOUNTER — Other Ambulatory Visit: Payer: Self-pay

## 2020-04-25 ENCOUNTER — Encounter: Payer: Medicare Other | Attending: General Surgery | Admitting: Dietician

## 2020-04-25 DIAGNOSIS — K436 Other and unspecified ventral hernia with obstruction, without gangrene: Secondary | ICD-10-CM | POA: Diagnosis not present

## 2020-04-25 DIAGNOSIS — Z713 Dietary counseling and surveillance: Secondary | ICD-10-CM | POA: Insufficient documentation

## 2020-04-25 DIAGNOSIS — Z6841 Body Mass Index (BMI) 40.0 and over, adult: Secondary | ICD-10-CM | POA: Diagnosis not present

## 2020-04-25 DIAGNOSIS — E669 Obesity, unspecified: Secondary | ICD-10-CM

## 2020-04-25 NOTE — Progress Notes (Signed)
Supervised Weight Loss Visit Bariatric Nutrition Education  Planned Surgery: Sleeve  Pt Expectation of Surgery/ Goals: weight loss  2 out of 6 SWL Appointments    NUTRITION ASSESSMENT  Anthropometrics  Start weight at [facility]: 312 lbs (date: 02/20/2020) Today's weight: 312.8 lbs (*weight increase since last visit may be due to new scale in our office)  BMI: 57.2 kg/m2    Lifestyle & Dietary Hx Patient brought her food journal again today, continues to track what she eats daily. States this has been helpful to her and she would like to continue doing this. Typical meal pattern is 2-3 meals per day, sometimes does not have lunch. Meeting protein goal. Doing well with including vegetables at dinner. May have cereal with bacon and an egg sandwich, oatmeal with brats, salad with imitation crab, or salmon with a starch and vegetable. Usually does not snack. States she has been working on drinking more water and can meet the 64 ounce goal on most days, which is an increase from her usual 32 ounces per day.   Estimated daily fluid intake: 48-64 oz Current average weekly physical activity: some walking  24-Hr Dietary Recall First Meal: oatmeal + toast Snack: - Second Meal: fish sticks + tater tots  Snack: - Third Meal: vegetable soup + [place] & ham sandwich  Snack: - Beverages: water, coffee, hot tea w/ honey    NUTRITION DIAGNOSIS  Overweight/obesity ([place]-3.3) related to past poor dietary habits and physical inactivity as evidenced by patient w/ planned Sleeve surgery following dietary guidelines for continued weight loss.   NUTRITION INTERVENTION  Nutrition counseling (C-1) and education (E-2) to facilitate bariatric surgery goals.  Pre-Op Goals Progress & New Goals . Working on eating at least 3 times per day  . Tracking food intake each day on paper  . Found appropriate protein shakes  . Meeting daily protein goal . Continue to work on drinking at least 64 ounces of fluid daily   . Continue including 3 things with lunch/dinner: veggies (1/2 of meal), carbs, and protein  . NEW: Practice chewing your food very thoroughly   Handouts Provided Include   N/A   Learning Style & Readiness for Change Teaching method utilized: Visual & Auditory  Demonstrated degree of understanding via: Teach Back  Barriers to learning/adherence to lifestyle change: None Identified   MONITORING & EVALUATION Dietary intake, weekly physical activity, body weight, and pre-op goals in 1 month.   Next Steps  Patient is to return to [facility] in 1 month for 3rd SWL visit.

## 2020-05-01 ENCOUNTER — Telehealth: Payer: Self-pay | Admitting: Family

## 2020-05-01 NOTE — Telephone Encounter (Signed)
She can use mucinex or coricidin. I would recommend that she take an Kinder test and schedule Virtual office visit if test is positive or if her symptoms worsen.

## 2020-05-01 NOTE — Telephone Encounter (Signed)
Patient states she has a cold and would like to know what can she take since, she has so many medical issues.

## 2020-05-02 NOTE — Telephone Encounter (Signed)
Spoke with patient regarding symptoms.  Patient reports nasal congestion, cough and ear ache x 2 days. Denies fever or SOB.  Advised per PCP use of Mucinex or Coricidin and recommendation of OTC COVID testing.  Patient verbalized understanding. Declines VV at this time.

## 2020-05-02 NOTE — Telephone Encounter (Signed)
LM requesting call back.  

## 2020-05-12 ENCOUNTER — Other Ambulatory Visit: Payer: Self-pay | Admitting: Family

## 2020-05-27 ENCOUNTER — Encounter: Payer: Medicare Other | Attending: General Surgery | Admitting: Skilled Nursing Facility1

## 2020-05-27 ENCOUNTER — Other Ambulatory Visit: Payer: Self-pay

## 2020-05-27 DIAGNOSIS — E119 Type 2 diabetes mellitus without complications: Secondary | ICD-10-CM | POA: Insufficient documentation

## 2020-05-27 DIAGNOSIS — E669 Obesity, unspecified: Secondary | ICD-10-CM | POA: Insufficient documentation

## 2020-05-27 NOTE — Progress Notes (Signed)
Supervised Weight Loss Visit Bariatric Nutrition Education  Planned Surgery: Sleeve  Pt Expectation of Surgery/ Goals: weight loss  3 out of 6 SWL Appointments    NUTRITION ASSESSMENT  Anthropometrics  Start weight at [facility]: 312 lbs (date: 02/20/2020) Today's weight: 313.4 pounds BMI: 57.32 kg/m2    Lifestyle & Dietary Hx  Pt states she logs her foods throughout the week stating she has not learned much from doing it but finding she skipped lunch. Pt states she goes to bed around 12-1:30am states she wakes a couple times throughout the night. Pt states she does not have restful nights. Pt states she adds grease and butter to her vegetables. Pt states she is struggling to cut back. Pt states she eats the food if it is in front of her: dietiian asked if she could just not buy those foods and pt states that is too hard.   Estimated daily fluid intake: 48-64 oz Current average weekly physical activity: some walking Dignoses: diabetes (A1C 6.6), OSA  24-Hr Dietary Recall First Meal 11am: oatmeal + toast Snack:  Second Meal: fish sticks + tater tots or skipped Snack:  Third Meal 7-8pm: vegetable soup + [place] & ham sandwich  Snack: "junk food"  Beverages: water, coffee + sugar, hot tea w/ honey    NUTRITION DIAGNOSIS  Overweight/obesity ([place]-3.3) related to past poor dietary habits and physical inactivity as evidenced by patient w/ planned Sleeve surgery following dietary guidelines for continued weight loss.   NUTRITION INTERVENTION  Nutrition counseling (C-1) and education (E-2) to facilitate bariatric surgery goals.  Pre-Op Goals Progress & New Goals . Working on eating at least 3 times per day  . Tracking food intake each day on paper  . Found appropriate protein shakes  . Meeting daily protein goal . Continue: to work on drinking at least 64 ounces of fluid daily  . Continue: including 3 things with lunch/dinner: veggies (1/2 of meal), carbs, and protein  . Continue:  Practice chewing your food very thoroughly  NEW: First meal:  11-12pm  Second meal: 5pm-6pm  Third meal: 11pm-12am  Meat the size of your palm, non-starchy vegetables without a fat and  cup complex carbohydrates (brown rice, lima beans, all beans (except green beans) peas, corn, 1 piece of fruit) NEW: Do not buy chips and Cheetos; write down how you feel about not having them and when you crave them   Handouts Previously Provided Include   Detailed MyPlate  Learning Style & Readiness for Change Teaching method utilized: Visual & Auditory  Demonstrated degree of understanding via: Teach Back  Barriers to learning/adherence to lifestyle change: None Identified   MONITORING & EVALUATION Dietary intake, weekly physical activity, body weight, and pre-op goals in 1 month.   Next Steps  Patient is to return to [facility]

## 2020-05-28 ENCOUNTER — Ambulatory Visit: Payer: Medicare Other | Admitting: Cardiology

## 2020-06-02 ENCOUNTER — Ambulatory Visit: Payer: Medicare Other | Admitting: Obstetrics & Gynecology

## 2020-06-09 ENCOUNTER — Other Ambulatory Visit (HOSPITAL_COMMUNITY)
Admission: RE | Admit: 2020-06-09 | Discharge: 2020-06-09 | Disposition: A | Discharge status: Home / Self Care | Payer: Medicare Other | Source: Ambulatory Visit | Attending: Obstetrics & Gynecology | Admitting: Obstetrics & Gynecology

## 2020-06-09 ENCOUNTER — Encounter: Payer: Self-pay | Admitting: Obstetrics & Gynecology

## 2020-06-09 ENCOUNTER — Ambulatory Visit (INDEPENDENT_AMBULATORY_CARE_PROVIDER_SITE_OTHER): Payer: Medicare Other | Admitting: Obstetrics & Gynecology

## 2020-06-09 ENCOUNTER — Other Ambulatory Visit: Payer: Self-pay

## 2020-06-09 VITALS — BP 111/67 | HR 97 | Wt 313.0 lb

## 2020-06-09 DIAGNOSIS — Z124 Encounter for screening for malignant neoplasm of cervix: Secondary | ICD-10-CM | POA: Diagnosis not present

## 2020-06-09 DIAGNOSIS — D261 Other benign neoplasm of corpus uteri: Secondary | ICD-10-CM | POA: Diagnosis not present

## 2020-06-09 DIAGNOSIS — N950 Postmenopausal bleeding: Secondary | ICD-10-CM

## 2020-06-09 DIAGNOSIS — R87612 Low grade squamous intraepithelial lesion on cytologic smear of cervix (LGSIL): Secondary | ICD-10-CM | POA: Diagnosis not present

## 2020-06-09 DIAGNOSIS — Z1151 Encounter for screening for human papillomavirus (HPV): Secondary | ICD-10-CM | POA: Diagnosis not present

## 2020-06-09 NOTE — Patient Instructions (Signed)
Endometrial Biopsy  An endometrial biopsy is a procedure to remove tissue samples from the endometrium, which is the lining of the uterus. The tissue that is removed can then be checked under a microscope for disease. This procedure is used to diagnose conditions such as endometrial cancer, endometrial tuberculosis, polyps, or other inflammatory conditions. This procedure may also be used to investigate uterine bleeding to determine where you are in your menstrual cycle or how your hormone levels are affecting the lining of the uterus. Tell a health care provider about:  Any allergies you have.  All medicines you are taking, including vitamins, herbs, eye drops, creams, and over-the-counter medicines.  Any problems you or family members have had with anesthetic medicines.  Any blood disorders you have.  Any surgeries you have had.  Any medical conditions you have.  Whether you are pregnant or may be pregnant. What are the risks? Generally, this is a safe procedure. However, problems may occur, including:  Bleeding.  Pelvic infection.  Puncture of the wall of the uterus with the biopsy device (rare).  Allergic reactions to medicines. What happens before the procedure?  Keep a record of your menstrual cycles as told by your health care provider. You may need to schedule your procedure for a specific time in your cycle.  You may want to bring a sanitary pad to wear after the procedure.  Plan to have someone take you home from the hospital or clinic.  Ask your health care provider about: ? Changing or stopping your regular medicines. This is especially important if you are taking diabetes medicines, arthritis medicines, or blood thinners. ? Taking medicines such as aspirin and ibuprofen. These medicines can thin your blood. Do not take these medicines unless your health care provider tells you to take them. ? Taking over-the-counter medicines, vitamins, herbs, and  supplements. What happens during the procedure?  You will lie on an exam table with your feet and legs supported as in a pelvic exam.  Your health care provider will insert an instrument (speculum) into your vagina to see your cervix.  Your cervix will be cleansed with an antiseptic solution.  A medicine (local anesthetic) will be used to numb the cervix.  A forceps instrument (tenaculum) will be used to hold your cervix steady for the biopsy.  A thin, rod-like instrument (uterine sound) will be inserted through your cervix to determine the length of your uterus and the location where the biopsy sample will be removed.  A thin, flexible tube (catheter) will be inserted through your cervix and into the uterus. The catheter will be used to collect the biopsy sample from your endometrial tissue.  The catheter and speculum will then be removed, and the tissue sample will be sent to a lab for examination. The procedure may vary among health care providers and hospitals. What can I expect after procedure?  You will rest in a recovery area until you are ready to go home.  You may have mild cramping and a small amount of vaginal bleeding. This is normal.  You may have a small amount of vaginal bleeding for a few days. This is normal.  It is up to you to get the results of your procedure. Ask your health care provider, or the department that is doing the procedure, when your results will be ready. Follow these instructions at home:  Take over-the-counter and prescription medicines only as told by your health care provider.  Do not douche, use tampons, or have   sexual intercourse until your health care provider approves.  Return to your normal activities as told by your health care provider. Ask your health care provider what activities are safe for you.  Follow instructions from your health care provider about any activity restrictions, such as restrictions on strenuous exercise or heavy  lifting.  Keep all follow-up visits. This is important. Contact a health care provider:  You have heavy bleeding, or bleed for longer than 2 days after the procedure.  You have bad smelling discharge from your vagina.  You have a fever or chills.  You have a burning sensation when urinating or you have difficulty urinating.  You have severe pain in your lower abdomen. Get help right away if you:  You have severe cramps in your stomach or back.  You pass large blood clots.  Your bleeding increases.  You become weak or light-headed, or you faint or lose consciousness. Summary  An endometrial biopsy is a procedure to remove tissue samples is taken from the endometrium, which is the lining of the uterus.  The tissue sample that is removed will be checked under a microscope for disease.  This procedure is used to diagnose conditions such as endometrial cancer, endometrial tuberculosis, polyps, or other inflammatory conditions.  After the procedure, it is common to have mild cramping and a small amount of vaginal bleeding for a few days.  Do not douche, use tampons, or have sexual intercourse until your health care provider approves. Ask your health care provider which activities are safe for you. This information is not intended to replace advice given to you by your health care provider. Make sure you discuss any questions you have with your health care provider. Document Revised: 11/26/2019 Document Reviewed: 11/26/2019 Elsevier Patient Education  2021 Elsevier Inc. ENDOMETRIAL BIOPSY POST-PROCEDURE INSTRUCTIONS  1. You may take Ibuprofen, Aleve or Tylenol for pain if needed.  Cramping should resolve within in 24 hours.  2. You may have a small amount of spotting.  You should wear a mini pad for the next few days.  3. You may have intercourse after 24 hours.  4. You need to call if you have any pelvic pain, fever, heavy bleeding or foul smelling vaginal  discharge.  5. Shower or bathe as normal  6. We will call you within one week with results or we will discuss   the results at your follow-up appointment if needed.   

## 2020-06-09 NOTE — Addendum Note (Signed)
Addended by: Phill Myron on: 06/09/2020 10:43 AM   Modules accepted: Orders

## 2020-06-09 NOTE — Progress Notes (Signed)
05/22/2019 patient had D&C with novasure.  Patient states she started bleeding again about 2 weeks ago. Patient states bleeding is lasting about 8-9 days. Kathrene Alu RN

## 2020-06-09 NOTE — Progress Notes (Signed)
History:  65 y.o. G2P2000 here today for eval of PMPB. Pt has similar sx 1 year prev and is s/p hysteroscopy with D&C and polypectomy with Myosure. Pt reports 1 week of bleeding several weeks ago. She denies further bleeding. She denies pain and had had no constitutional sx.  The following portions of the patient's history were reviewed and updated as appropriate: allergies, current medications, past family history, past medical history, past social history, past surgical history and problem list.  Review of Systems:  Pertinent items are noted in HPI.    Objective:  Physical Exam Blood pressure 111/67, pulse 97, weight (!) 313 lb (142 kg), last menstrual period 11/10/2011.  CONSTITUTIONAL: Well-developed, well-nourished female in no acute distress.  HENT:  Normocephalic, atraumatic EYES: Conjunctivae and EOM are normal. No scleral icterus.  NECK: Normal range of motion SKIN: Skin is warm and dry. No rash noted. Not diaphoretic.No pallor. [place]: Alert and oriented to person, place, and time. Normal coordination.  Pelvic: Normal appearing external genitalia; normal appearing vaginal mucosa and cervix.  Normal discharge.  Uterine size not possible due to pts body habitus. No uterine or adnexal tenderness  GYN PROCEDURE The indications for endometrial biopsy were reviewed.   Risks of the biopsy including cramping, bleeding, infection, uterine perforation, inadequate specimen and need for additional procedures  were discussed. The patient states she understands and agrees to undergo procedure today. Consent was signed. Time out was performed. Urine HCG was negative. A sterile speculum was placed in the patient's vagina and the cervix was prepped with Betadine. A single-toothed tenaculum was placed on the anterior lip of the cervix to stabilize it. The 3 mm pipelle was introduced into the endometrial cavity without difficulty to a depth of 8cm, and a moderate amount of tissue was obtained and  sent to pathology. The instruments were removed from the patient's vagina. Minimal bleeding from the cervix was noted. The patient tolerated the procedure well. Routine post-procedure instructions were given to the patient. The patient will follow up to review the results and for further management.      Labs and Imaging 05/22/2019 ENDOMETRIUM, POLYPECTOMY:  - Disordered proliferative endometrium.  - Focal changes suggestive of endometrial polyp.  - No atypia or malignancy.   Assessment & Plan:  Post menopausal bleeding. Pt with h/o same. S/p polypectomy 1 year ago. Endo bx done today.   F/u surg path  TVUS  F/u for results.   F/u PAP Reviewed with pt the concern for return of polyps but, the need to r/o endometrial cancer. All questions answered.   Graciemae Delisle L. Harraway-Smith, M.D., [name]

## 2020-06-10 LAB — SURGICAL PATHOLOGY

## 2020-06-11 ENCOUNTER — Telehealth: Payer: Self-pay | Admitting: *Deleted

## 2020-06-11 NOTE — Telephone Encounter (Signed)
The pt has been scheduled for an office visit and she has been advised

## 2020-06-11 NOTE — Telephone Encounter (Signed)
She needs OV to discuss this before setting her up for repeat colonoscopy.  Patty, can you reach out to her for my next available OV, h/o polyps, morbid obesity.  Thanks

## 2020-06-11 NOTE — Telephone Encounter (Signed)
Dr Ardis Hughs,  This pt had a colon with you in 2016 at [facility] due to BMI > 50- she has been put on the [facility] schedule for 2-18- her current BMI is 57.23-  She has a hx of TA polyps 2011- her 2016 colon was normal-  5 yr recall due to her TA hx-   Do you want her to be a direct at [facility] or does she need an OV  Please advise- thanks , Marijean Niemann

## 2020-06-11 NOTE — Telephone Encounter (Signed)
OV 3-29 with dr Ardis Hughs- lec pv and colon canceled

## 2020-06-13 LAB — CYTOLOGY - PAP
Comment: NEGATIVE
High risk HPV: NEGATIVE

## 2020-06-17 ENCOUNTER — Other Ambulatory Visit: Payer: Self-pay

## 2020-06-17 ENCOUNTER — Ambulatory Visit (HOSPITAL_BASED_OUTPATIENT_CLINIC_OR_DEPARTMENT_OTHER)
Admission: RE | Admit: 2020-06-17 | Discharge: 2020-06-17 | Disposition: A | Discharge status: Home / Self Care | Payer: Medicare Other | Source: Ambulatory Visit | Attending: Obstetrics & Gynecology | Admitting: Obstetrics & Gynecology

## 2020-06-17 ENCOUNTER — Encounter: Payer: Self-pay | Admitting: Family

## 2020-06-17 ENCOUNTER — Ambulatory Visit (INDEPENDENT_AMBULATORY_CARE_PROVIDER_SITE_OTHER): Payer: Medicare Other | Admitting: Family

## 2020-06-17 VITALS — BP 138/71 | HR 79 | Temp 97.9°F | Resp 16 | Ht 62.0 in | Wt 315.0 lb

## 2020-06-17 DIAGNOSIS — A6000 Herpesviral infection of urogenital system, unspecified: Secondary | ICD-10-CM

## 2020-06-17 DIAGNOSIS — N950 Postmenopausal bleeding: Secondary | ICD-10-CM

## 2020-06-17 DIAGNOSIS — E782 Mixed hyperlipidemia: Secondary | ICD-10-CM

## 2020-06-17 DIAGNOSIS — Z0000 Encounter for general adult medical examination without abnormal findings: Secondary | ICD-10-CM

## 2020-06-17 DIAGNOSIS — E119 Type 2 diabetes mellitus without complications: Secondary | ICD-10-CM

## 2020-06-17 DIAGNOSIS — J45909 Unspecified asthma, uncomplicated: Secondary | ICD-10-CM

## 2020-06-17 DIAGNOSIS — I10 Essential (primary) hypertension: Secondary | ICD-10-CM | POA: Diagnosis not present

## 2020-06-17 LAB — LIPID PANEL
Cholesterol: 149 mg/dL (ref 0–200)
HDL: 44 mg/dL (ref >39.00)
LDL Cholesterol: 86 mg/dL (ref 0–99)
NonHDL: 105.45
Total CHOL/HDL Ratio: 3
Triglycerides: 98 mg/dL (ref 0.0–149.0)
VLDL: 19.6 mg/dL (ref 0.0–40.0)

## 2020-06-17 LAB — BASIC METABOLIC PANEL
BUN: 15 mg/dL (ref 6–23)
CO2: 32 mEq/L (ref 19–32)
Calcium: 9.3 mg/dL (ref 8.4–10.5)
Chloride: 102 mEq/L (ref 96–112)
Creatinine, Ser: 0.81 mg/dL (ref 0.40–1.20)
GFR: 77.64 mL/min (ref >60.00)
Glucose, Bld: 99 mg/dL (ref 70–99)
Potassium: 3.5 mEq/L (ref 3.5–5.1)
Sodium: 139 mEq/L (ref 135–145)

## 2020-06-17 LAB — HEMOGLOBIN A1C: Hgb A1c MFr Bld: 6.7 % — ABNORMAL HIGH (ref 4.6–6.5)

## 2020-06-17 MED ORDER — SHINGRIX 50 MCG/0.5ML IM SUSR: INTRAMUSCULAR | 1 refills | Status: DC

## 2020-06-17 MED ORDER — TETANUS-DIPHTH-ACELL PERTUSSIS 5-2.5-18.5 LF-MCG/0.5 IM SUSP: 0.5000 mL | Freq: Once | INTRAMUSCULAR | 0 refills | Status: AC

## 2020-06-17 NOTE — Patient Instructions (Signed)
Please complete lab work prior to leaving.   

## 2020-06-17 NOTE — Progress Notes (Signed)
Subjective:    Patient ID: Leah Hampton, female    DOB: 03/04/1956, 65 y.o.   MRN: [id number]  HPI   Patient is a 65 yr old female who presents today for follow up.   GERD- maintained on omeprazole 66m. Denies any recent symptoms.  Asthma- continues flovent bid and albuterol PRN. Reports well controlled.   HTN- maintained on hyzaar 100-261m   BP Readings from Last 3 Encounters:  06/17/20 138/71  06/09/20 111/67  03/14/20 (!) 132/59   Hyperlipidemia- maintained on pravastatin 8031m   Lab Results  Component Value Date   CHOL 165 07/27/2019   HDL 46.40 07/27/2019   LDLCALC 102 (H) 07/27/2019   TRIG 85.0 07/27/2019   CHOLHDL 4 07/27/2019   Diabetes type 2- diet controlled.   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   Review of Systems    see HPI  Past Medical History:  Diagnosis Date  . Bronchitis   . Cystitis   . Diabetes type 2, controlled ([facility] . Hematuria    urologice eval, Dr EskEstill Dooms History of chest pain   . Hyperbilirubinemia   . Hyperlipemia   . Hypertelorism   . Hypertension   . Leukocytosis   . Obesity   . Osteoarthritis    right knee  . Sleep apnea 2012   slight   . Vaginal Pap smear, abnormal   . Vitamin D deficiency 02/09/2018     Social History   Socioeconomic History  . Marital status: Single    Spouse name: Not on file  . Number of children: 2  . Years of education: Not on file  . Highest education level: Not on file  Occupational History  . Occupation: disabled  Tobacco Use  . Smoking status: Never Smoker  . Smokeless tobacco: Never Used  Vaping Use  . Vaping Use: Never used  Substance and Sexual Activity  . Alcohol use: No  . Drug use: No  .  Sexual activity: Not Currently  Other Topics Concern  . Not on file  Social History Narrative   Regular exercise:  No   Lives alone   Has 2 grown sons, both local.  1 grandson   On disability due to her knee pain   Completed 12th grade   Enjoys television   No pets.    Social Determinants of Health   Financial Resource Strain: Low Risk   . Difficulty of Paying Living Expenses: Not hard at all  Food Insecurity: No Food Insecurity  . Worried About RunCharity fundraiser the Last Year: Never true  . Ran Out of Food in the Last Year: Never true  Transportation Needs: No Transportation Needs  . Lack of Transportation (Medical): No  . Lack of Transportation (Non-Medical): No  Physical Activity: Not on file  Stress: Not on file  Social Connections: Not on file  Intimate Partner Violence: Not on file    Past Surgical History:  Procedure Laterality Date  . COLONOSCOPY WITH PROPOFOL N/A 03/27/2015   Procedure: COLONOSCOPY WITH PROPOFOL;  Surgeon: DanMilus BanisterD;  Location: [facility];  Service: Endoscopy;  Laterality: N/A;  . DILATATION & CURETTAGE/HYSTEROSCOPY WITH  MYOSURE N/A 01/05/2017   Procedure: DILATATION & CURETTAGE/HYSTEROSCOPY WITH MYOSURE;  Surgeon: Thurnell Lose, MD;  Location: [facility];  Service: Gynecology;  Laterality: N/A;  PostMenopausal Bleeding  . HYSTEROSCOPY WITH D & C N/A 05/22/2019   Procedure: DILATATION AND CURETTAGE /HYSTEROSCOPY, POLYPECTOMY WITH MYOSURE;  Surgeon: Lavonia Drafts, MD;  Location: [facility];  Service: Gynecology;  Laterality: N/A;  . KNEE ARTHROSCOPY  04/2007   left knee  . TOTAL KNEE ARTHROPLASTY  10/2008   Dr Roxine Caddy    Family History  Problem Relation Age of Onset  . Heart failure Mother   . Arthritis Mother   . Hypertension Mother   . Stroke Mother   . Diabetes Mellitus II Mother   . Heart attack Father 70  . Diabetes Other   . Arthritis Other   . Stroke Other   . Coronary artery disease Other   . Hypertension Sister         x 2  . CVA Sister   . CAD Sister 4  . Diabetes Mellitus II Sister   . Cirrhosis Brother   . Alcohol abuse Brother   . Colon cancer Neg Hx     Allergies  Allergen Reactions  . Sulfamethoxazole-Trimethoprim [Bactrim] Itching  . Codeine Nausea And Vomiting and Anxiety    Current Outpatient Medications on File Prior to Visit  Medication Sig Dispense Refill  . albuterol (VENTOLIN HFA) 108 (90 Base) MCG/ACT inhaler USE 2 INHALATIONS BY MOUTH  EVERY 6 HOURS AS NEEDED FOR WHEEZING OR SHORTNESS OF  BREATH 34 g 3  . blood glucose meter kit and supplies KIT Dispense based on patient and insurance preference. Use up to four times daily as directed. (FOR ICD-9 250.00, 250.01). 1 each 0  . Ciclopirox 0.77 % gel Apply 1 application topically 2 (two) times daily. 45 g 0  . fluticasone (FLOVENT HFA) 110 MCG/ACT inhaler Inhale 2 puffs into the lungs 2 (two) times daily. 36 g 1  . losartan-hydrochlorothiazide (HYZAAR) 100-25 MG tablet Take 1 tablet by mouth daily. 90 tablet 1  . Multiple Vitamin (MULTIVITAMIN WITH MINERALS) TABS tablet Take 1 tablet by mouth daily. Vitafusion    . omeprazole (PRILOSEC) 20 MG capsule Take 1 capsule (20 mg total) by mouth daily. 90 capsule 1  . potassium chloride SA (KLOR-CON) 20 MEQ tablet TAKE 1 TABLET(20 MEQ) BY MOUTH DAILY 90 tablet 1  . pravastatin (PRAVACHOL) 80 MG tablet Take 1 tablet (80 mg total) by mouth daily. 90 tablet 1  . psyllium (METAMUCIL) 58.6 % powder Take 1 packet by mouth daily.    . valACYclovir (VALTREX) 1000 MG tablet TAKE 1 TABLET BY MOUTH  DAILY 90 tablet 1   No current facility-administered medications on file prior to visit.    BP 138/71 (BP Location: Right Arm, Patient Position: Sitting, Cuff Size: Large)   Pulse 79   Temp 97.9 F (36.6 C) (Oral)   Resp 16   Ht $R'5\' 2"'rm$  (1.575 m)   Wt (!) 315 lb (142.9 kg)   LMP 11/10/2011   SpO2 99%   BMI 57.61 kg/m    Objective:   Physical Exam Constitutional:      Appearance: She is  well-developed and well-nourished.  Cardiovascular:     Rate and Rhythm: Normal rate and regular rhythm.     Heart sounds: Normal heart sounds. No murmur heard.   Pulmonary:     Effort: Pulmonary effort is normal. No respiratory distress.     Breath sounds: Normal breath sounds.  No wheezing.  Psychiatric:        Mood and Affect: Mood and affect normal.        Behavior: Behavior normal.        Thought Content: Thought content normal.        Judgment: Judgment normal.           Assessment & Plan:  GERD- stable on omeprazole 72m. Continue same.  Wt Readings from Last 3 Encounters:  06/17/20 (!) 315 lb (142.9 kg)  06/09/20 (!) 313 lb (142 kg)  05/27/20 (!) 313 lb 6.4 oz (142.2 kg)   Asthma- stable. Continue flovent MDI/Albuterol MDI.  HTN- BP at goal. Continue hyzaar 100-261m   DM2- clinically stable. Obtain follow up A1C.  Hyperlipidemia- LDL was slightly above goal last visit.  Repeat lipid panel, continue pravastatin 8039mnce daily.   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  This visit occurred during the SARS-CoV-2 public health emergency.  Safety protocols were in place, including screening questions prior to the visit, additional usage of staff PPE, and extensive cleaning of exam room while observing appropriate contact time as indicated for disinfecting solutions.

## 2020-06-18 NOTE — Progress Notes (Signed)
Mailed out to patient 

## 2020-06-26 ENCOUNTER — Other Ambulatory Visit: Payer: Self-pay

## 2020-06-26 ENCOUNTER — Encounter: Payer: Medicare Other | Attending: General Surgery | Admitting: Skilled Nursing Facility1

## 2020-06-26 DIAGNOSIS — E669 Obesity, unspecified: Secondary | ICD-10-CM | POA: Diagnosis present

## 2020-06-26 DIAGNOSIS — E119 Type 2 diabetes mellitus without complications: Secondary | ICD-10-CM | POA: Diagnosis not present

## 2020-06-26 NOTE — Progress Notes (Signed)
Supervised Weight Loss Visit Bariatric Nutrition Education  Planned Surgery: Sleeve  Pt Expectation of Surgery/ Goals: weight loss  4 out of 6 SWL Appointments    NUTRITION ASSESSMENT  Anthropometrics  Start weight at [facility]: 312 lbs (date: 02/20/2020) Today's weight: 312.8 pounds BMI: 57.21 kg/m2    Lifestyle & Dietary Hx  Pt arrives stating she has a lot of knee pain limiting her mobility. Pt states he thinks she could have done better this past month.  Pt states he was not able to eat 3 meals. Pt states she was able to not buy any chips or cheetoes. Pt states she has been trying to drink more water.  Pt states she has been logging her foods stating it helps her some. Pt states she does not like a lot of non starchy vegetables stating she wants to try to eat some different ones.   Estimated daily fluid intake: 48-64 oz Current average weekly physical activity: some walking Dignoses: diabetes (A1C 6.6), OSA  24-Hr Dietary Recall First Meal 11am: oatmeal + toast or oatmeal + bacon + egg + cheese Snack:  Second Meal: fish sticks + tater tots or skipped or hot dogs and french fries  Snack:  Third Meal 7-8pm: vegetable soup + [place] & ham sandwich or hamburger + beans or noodles + chicken + beans Snack: "junk food"  Beverages: water, coffee + sugar creamer, hot tea w/ honey, some soda   NUTRITION DIAGNOSIS  Overweight/obesity ([place]-3.3) related to past poor dietary habits and physical inactivity as evidenced by patient w/ planned Sleeve surgery following dietary guidelines for continued weight loss.   NUTRITION INTERVENTION  Nutrition counseling (C-1) and education (E-2) to facilitate bariatric surgery goals.  Pre-Op Goals Progress & New Goals . Working on eating at least 3 times per day  . Tracking food intake each day on paper  . Found appropriate protein shakes  . Meeting daily protein goal . Continue: to work on drinking at least 64 ounces of fluid daily  . Continue:  including 3 things with lunch/dinner: veggies (1/2 of meal), carbs, and protein  . Continue: Practice chewing your food very thoroughly  . NEW: try some different non starchy vegetables not cooking with grease and butter . NEW: try the sugar free creamer in your coffee  Handouts Previously Provided Include   Detailed MyPlate  Learning Style & Readiness for Change Teaching method utilized: Visual & Auditory  Demonstrated degree of understanding via: Teach Back  Barriers to learning/adherence to lifestyle change: None Identified   MONITORING & EVALUATION Dietary intake, weekly physical activity, body weight, and pre-op goals in 1 month.   Next Steps  Patient is to return to [facility]

## 2020-06-27 ENCOUNTER — Encounter: Payer: Self-pay | Admitting: Obstetrics & Gynecology

## 2020-06-27 ENCOUNTER — Telehealth (INDEPENDENT_AMBULATORY_CARE_PROVIDER_SITE_OTHER): Payer: Medicare Other | Admitting: Obstetrics & Gynecology

## 2020-06-27 DIAGNOSIS — R87612 Low grade squamous intraepithelial lesion on cytologic smear of cervix (LGSIL): Secondary | ICD-10-CM | POA: Diagnosis not present

## 2020-06-27 DIAGNOSIS — N950 Postmenopausal bleeding: Secondary | ICD-10-CM

## 2020-06-27 NOTE — Progress Notes (Signed)
TELEHEALTH GYNECOLOGY VISIT ENCOUNTER NOTE  Provider location: [facility] [place] [facility]   I connected with Leah Hampton on 06/27/20 at 11:15 AM EST by telephone at home and verified that I am speaking with the correct person using two identifiers. Patient was unable to do MyChart audiovisual encounter due to technical difficulties, she tried several times.    I discussed the limitations, risks, security and privacy concerns of performing an evaluation and management service by telephone and the availability of in person appointments. I also discussed with the patient that there may be a patient responsible charge related to this service. The patient expressed understanding and agreed to proceed.   History:  Leah Hampton is a 65 y.o. G4P2000 female being evaluated today for PMPB. Pt reports that she cont to have a 'little spotting here and there. Not heavy.' She denies pelvic pain. She has had 2 prev D&C's for PMPB. One with me and the other with Dr. Margie Billet.  She is scheduled to have weight loss surgery that she thinks will be in April.       Past Medical History:  Diagnosis Date  . Bronchitis   . Cystitis   . Diabetes type 2, controlled ([place])   . Hematuria    urologice eval, Dr Estill Dooms  . History of chest pain   . Hyperbilirubinemia   . Hyperlipemia   . Hypertelorism   . Hypertension   . Leukocytosis   . Obesity   . Osteoarthritis    right knee  . Sleep apnea 2012   slight   . Vaginal Pap smear, abnormal   . Vitamin D deficiency 02/09/2018   Past Surgical History:  Procedure Laterality Date  . COLONOSCOPY WITH PROPOFOL N/A 03/27/2015   Procedure: COLONOSCOPY WITH PROPOFOL;  Surgeon: Milus Banister, MD;  Location: [facility];  Service: Endoscopy;  Laterality: N/A;  . DILATATION & CURETTAGE/HYSTEROSCOPY WITH MYOSURE N/A 01/05/2017   Procedure: DILATATION & CURETTAGE/HYSTEROSCOPY WITH MYOSURE;  Surgeon: Thurnell Lose, MD;  Location: [facility];  Service: Gynecology;  Laterality: N/A;  PostMenopausal Bleeding  . HYSTEROSCOPY WITH D & C N/A 05/22/2019   Procedure: DILATATION AND CURETTAGE /HYSTEROSCOPY, POLYPECTOMY WITH MYOSURE;  Surgeon: Lavonia Drafts, MD;  Location: [facility];  Service: Gynecology;  Laterality: N/A;  . KNEE ARTHROSCOPY  04/2007   left knee  . TOTAL KNEE ARTHROPLASTY  10/2008   Dr Roxine Caddy   The following portions of the patient's history were reviewed and updated as appropriate: allergies, current medications, past family history, past medical history, past social history, past surgical history and problem list.     Review of Systems:  Pertinent items noted in HPI and remainder of comprehensive ROS otherwise negative.  Physical Exam:   General:  Alert, oriented and cooperative.   Mental Status: Normal mood and affect perceived. Normal judgment and thought content.  Physical exam deferred due to nature of the encounter  Labs and Imaging Results for orders placed or performed in visit on 06/17/20 (from the past 336 hour(s))  Hemoglobin A1c   Collection Time: 06/17/20 10:59 AM  Result Value Ref Range   Hgb A1c MFr Bld 6.7 (H) 4.6 - 6.5 %  Lipid panel   Collection Time: 06/17/20 10:59 AM  Result Value Ref Range   Cholesterol 149 0 - 200 mg/dL   Triglycerides 98.0 0.0 - 149.0 mg/dL   HDL 44.00 >39.00 mg/dL   VLDL 19.6 0.0 - 40.0 mg/dL   LDL Cholesterol 86 0 -  99 mg/dL   Total CHOL/HDL Ratio 3    NonHDL 952.84   Basic metabolic panel   Collection Time: 06/17/20 10:59 AM  Result Value Ref Range   Sodium 139 135 - 145 mEq/L   Potassium 3.5 3.5 - 5.1 mEq/L   Chloride 102 96 - 112 mEq/L   CO2 32 19 - 32 mEq/L   Glucose, Bld 99 70 - 99 mg/dL   BUN 15 6 - 23 mg/dL   Creatinine, Ser 0.81 0.40 - 1.20 mg/dL   GFR 77.64 >60.00 mL/min   Calcium 9.3 8.4 - 10.5 mg/dL   US PELVIC COMPLETE WITH TRANSVAGINAL  Result Date: 06/17/2020 CLINICAL DATA:  Postmenopausal bleeding EXAM: TRANSABDOMINAL AND  TRANSVAGINAL ULTRASOUND OF PELVIS TECHNIQUE: Both transabdominal and transvaginal ultrasound examinations of the pelvis were performed. Transabdominal technique was performed for global imaging of the pelvis including uterus, ovaries, adnexal regions, and pelvic cul-de-sac. It was necessary to proceed with endovaginal exam following the transabdominal exam to visualize the uterus, endometrium, ovaries and adnexa. COMPARISON:  05/08/2019 FINDINGS: Uterus Measurements: 9.1 x 5.0 x 5.2 cm = volume: 125 mL. Right fundal fibroid measures up to 11 mm. Mildly heterogeneous echotexture. Scattered punctate calcifications in the uterus. Endometrium Thickness: Thickened, 13 mm.  No focal abnormality visualized. Right ovary Measurements: Not visualized.  No adnexal mass seen. Left ovary Measurements: Not visualized.  No adnexal mass seen. Other findings No abnormal free fluid. IMPRESSION: Endometrium thickened at 13 mm. In the setting of post-menopausal bleeding, endometrial sampling is indicated to exclude carcinoma. If results are benign, sonohysterogram should be considered for focal lesion work-up. (Ref: Radiological Reasoning: Algorithmic Workup of Abnormal Vaginal Bleeding with Endovaginal Sonography and Sonohysterography. AJR 2008; 132:G40-10) Small right fundal fibroid. Electronically Signed   By: Rolm Baptise M.D.   On: 06/17/2020 10:18      Assessment and Plan:     Diagnoses and all orders for this visit:  Post-menopausal bleeding  LGSIL on Pap smear of cervix    I discussed the assessment and treatment plan with the patient. The patient was provided an opportunity to ask questions and all were answered. The patient agreed with the plan and demonstrated an understanding of the instructions.   Patient desires surgical management with hysteroscopy with D&C and colposcopy.  The risks of surgery were discussed in detail with the patient including but not limited to: bleeding which may require transfusion or  reoperation; infection which may require prolonged hospitalization or re-hospitalization and antibiotic therapy; injury to bowel, bladder, ureters and major vessels or other surrounding organs; need for additional procedures including laparotomy; thromboembolic phenomenon, incisional problems and other postoperative or anesthesia complications.  Patient was told that the likelihood that her condition and symptoms will be treated effectively with this surgical management was very high; the postoperative expectations were also discussed in detail. The patient also understands the alternative treatment options which were discussed in full. All questions were answered.  She was told that she will be contacted by our surgical scheduler regarding the time and date of her surgery; routine preoperative instructions of having nothing to eat or drink after midnight on the day prior to surgery and also coming to the hospital 1 1/2 hours prior to her time of surgery were also emphasized.  She was told she may be called for a preoperative appointment about a week prior to surgery and will be given further preoperative instructions at that visit. Printed patient education handouts about the procedure were given to the patient  to review at home.   I provided 15 minutes of non-face-to-face time during this encounter.   Lavonia Drafts, MD [facility], [place]

## 2020-07-04 ENCOUNTER — Encounter: Payer: Medicare Other | Admitting: Gastroenterology

## 2020-07-07 DIAGNOSIS — R87612 Low grade squamous intraepithelial lesion on cytologic smear of cervix (LGSIL): Secondary | ICD-10-CM

## 2020-07-12 ENCOUNTER — Other Ambulatory Visit (HOSPITAL_COMMUNITY)
Admission: RE | Admit: 2020-07-12 | Discharge: 2020-07-12 | Disposition: A | Discharge status: Home / Self Care | Payer: Medicare Other | Source: Ambulatory Visit | Attending: Obstetrics & Gynecology | Admitting: Obstetrics & Gynecology

## 2020-07-12 ENCOUNTER — Other Ambulatory Visit: Payer: Self-pay | Admitting: Family

## 2020-07-12 DIAGNOSIS — Z01812 Encounter for preprocedural laboratory examination: Secondary | ICD-10-CM | POA: Insufficient documentation

## 2020-07-12 DIAGNOSIS — Z20822 Contact with and (suspected) exposure to covid-19: Secondary | ICD-10-CM | POA: Insufficient documentation

## 2020-07-12 DIAGNOSIS — I10 Essential (primary) hypertension: Secondary | ICD-10-CM

## 2020-07-12 LAB — SARS CORONAVIRUS 2 (TAT 6-24 HRS): SARS Coronavirus 2: NEGATIVE

## 2020-07-14 ENCOUNTER — Encounter (HOSPITAL_COMMUNITY): Payer: Self-pay | Admitting: Obstetrics & Gynecology

## 2020-07-14 NOTE — Progress Notes (Signed)
Patient denies chest pain or shortness of breath. States seen cardiology due to planned bariatric surgery but that it was not for any active concerns. States she has been in quarantine since [place] test.   Patient coming via transportation to hospital. Surgery time has changed per patient and they are going to pick her up at 0815. Patient states she will call and see if they can get her hear earlier.   Patient states she has a friend that will be taking her home and staying with her for 24 hours after surgery. States she does not check CBG.

## 2020-07-15 ENCOUNTER — Other Ambulatory Visit: Payer: Self-pay

## 2020-07-15 ENCOUNTER — Ambulatory Visit (HOSPITAL_COMMUNITY): Payer: Medicare Other | Admitting: Anesthesiology

## 2020-07-15 ENCOUNTER — Encounter (HOSPITAL_COMMUNITY): Payer: Self-pay | Admitting: Obstetrics & Gynecology

## 2020-07-15 ENCOUNTER — Encounter (HOSPITAL_COMMUNITY)
Admission: RE | Disposition: A | Discharge status: Home / Self Care | Payer: Self-pay | Source: Home / Self Care | Attending: Obstetrics & Gynecology

## 2020-07-15 ENCOUNTER — Ambulatory Visit (HOSPITAL_COMMUNITY)
Admission: RE | Admit: 2020-07-15 | Discharge: 2020-07-15 | Disposition: A | Discharge status: Home / Self Care | Payer: Medicare Other | Attending: Obstetrics & Gynecology | Admitting: Obstetrics & Gynecology

## 2020-07-15 DIAGNOSIS — R87612 Low grade squamous intraepithelial lesion on cytologic smear of cervix (LGSIL): Secondary | ICD-10-CM | POA: Diagnosis not present

## 2020-07-15 DIAGNOSIS — Z96652 Presence of left artificial knee joint: Secondary | ICD-10-CM | POA: Insufficient documentation

## 2020-07-15 DIAGNOSIS — Z881 Allergy status to other antibiotic agents status: Secondary | ICD-10-CM | POA: Insufficient documentation

## 2020-07-15 DIAGNOSIS — N950 Postmenopausal bleeding: Secondary | ICD-10-CM | POA: Diagnosis present

## 2020-07-15 DIAGNOSIS — E669 Obesity, unspecified: Secondary | ICD-10-CM | POA: Diagnosis present

## 2020-07-15 DIAGNOSIS — I10 Essential (primary) hypertension: Secondary | ICD-10-CM | POA: Diagnosis not present

## 2020-07-15 DIAGNOSIS — Z79899 Other long term (current) drug therapy: Secondary | ICD-10-CM | POA: Insufficient documentation

## 2020-07-15 DIAGNOSIS — E559 Vitamin D deficiency, unspecified: Secondary | ICD-10-CM | POA: Diagnosis not present

## 2020-07-15 DIAGNOSIS — N840 Polyp of corpus uteri: Secondary | ICD-10-CM

## 2020-07-15 HISTORY — PX: HYSTEROSCOPY WITH D & C: SHX1775

## 2020-07-15 HISTORY — DX: Unspecified chronic bronchitis: J42

## 2020-07-15 HISTORY — PX: COLPOSCOPY: SHX161

## 2020-07-15 HISTORY — PX: CERVICAL POLYPECTOMY: SHX88

## 2020-07-15 LAB — GLUCOSE, CAPILLARY
Glucose-Capillary: 108 mg/dL — ABNORMAL HIGH (ref 70–99)
Glucose-Capillary: 90 mg/dL (ref 70–99)

## 2020-07-15 LAB — BASIC METABOLIC PANEL
Anion gap: 11 (ref 5–15)
BUN: 16 mg/dL (ref 8–23)
CO2: 26 mmol/L (ref 22–32)
Calcium: 9 mg/dL (ref 8.9–10.3)
Chloride: 104 mmol/L (ref 98–111)
Creatinine, Ser: 0.74 mg/dL (ref 0.44–1.00)
GFR, Estimated: >60 mL/min (ref >60)
Glucose, Bld: 112 mg/dL — ABNORMAL HIGH (ref 70–99)
Potassium: 3.3 mmol/L — ABNORMAL LOW (ref 3.5–5.1)
Sodium: 141 mmol/L (ref 135–145)

## 2020-07-15 LAB — CBC
HCT: 38.5 % (ref 36.0–46.0)
Hemoglobin: 12.2 g/dL (ref 12.0–15.0)
MCH: 28.2 pg (ref 26.0–34.0)
MCHC: 31.7 g/dL (ref 30.0–36.0)
MCV: 88.9 fL (ref 80.0–100.0)
Platelets: 300 10*3/uL (ref 150–400)
RBC: 4.33 MIL/uL (ref 3.87–5.11)
RDW: 16.3 % — ABNORMAL HIGH (ref 11.5–15.5)
WBC: 9.7 10*3/uL (ref 4.0–10.5)
nRBC: 0 % (ref 0.0–0.2)

## 2020-07-15 SURGERY — DILATATION AND CURETTAGE /HYSTEROSCOPY
Anesthesia: General | Site: Uterus

## 2020-07-15 MED ORDER — FENTANYL CITRATE (PF) 250 MCG/5ML IJ SOLN
INTRAMUSCULAR | Status: AC
  Filled 2020-07-15: qty 5

## 2020-07-15 MED ORDER — MIDAZOLAM HCL 2 MG/2ML IJ SOLN
INTRAMUSCULAR | Status: AC
  Filled 2020-07-15: qty 2

## 2020-07-15 MED ORDER — FENTANYL CITRATE (PF) 100 MCG/2ML IJ SOLN
INTRAMUSCULAR | Status: DC | PRN
  Administered 2020-07-15 (×2): 25 ug via INTRAVENOUS

## 2020-07-15 MED ORDER — KETOROLAC TROMETHAMINE 30 MG/ML IJ SOLN
INTRAMUSCULAR | Status: DC | PRN
  Administered 2020-07-15: 30 mg via INTRAVENOUS

## 2020-07-15 MED ORDER — SODIUM CHLORIDE 0.9 % IR SOLN
Status: DC | PRN
  Administered 2020-07-15: 3000 mL

## 2020-07-15 MED ORDER — LIDOCAINE 2% (20 MG/ML) 5 ML SYRINGE
INTRAMUSCULAR | Status: DC | PRN
  Administered 2020-07-15: 60 mg via INTRAVENOUS

## 2020-07-15 MED ORDER — EPHEDRINE SULFATE 50 MG/ML IJ SOLN
INTRAMUSCULAR | Status: DC | PRN
  Administered 2020-07-15 (×2): 10 mg via INTRAVENOUS

## 2020-07-15 MED ORDER — ACETAMINOPHEN 500 MG PO TABS
1000.0000 mg | ORAL_TABLET | Freq: Once | ORAL | Status: AC
  Administered 2020-07-15: 1000 mg via ORAL

## 2020-07-15 MED ORDER — FENTANYL CITRATE (PF) 100 MCG/2ML IJ SOLN: 25.0000 ug | INTRAMUSCULAR | Status: DC | PRN

## 2020-07-15 MED ORDER — ORAL CARE MOUTH RINSE: 15.0000 mL | Freq: Once | OROMUCOSAL | Status: AC

## 2020-07-15 MED ORDER — PROPOFOL 10 MG/ML IV BOLUS
INTRAVENOUS | Status: AC
  Filled 2020-07-15: qty 20

## 2020-07-15 MED ORDER — MIDAZOLAM HCL 5 MG/5ML IJ SOLN
INTRAMUSCULAR | Status: DC | PRN
  Administered 2020-07-15: 2 mg via INTRAVENOUS

## 2020-07-15 MED ORDER — IBUPROFEN 600 MG PO TABS: 600.0000 mg | ORAL_TABLET | Freq: Four times a day (QID) | ORAL | 0 refills | Status: DC | PRN

## 2020-07-15 MED ORDER — LACTATED RINGERS IV SOLN: INTRAVENOUS | Status: DC

## 2020-07-15 MED ORDER — POVIDONE-IODINE 10 % EX SWAB
2.0000 "application " | Freq: Once | CUTANEOUS | Status: AC
  Administered 2020-07-15: 2 via TOPICAL

## 2020-07-15 MED ORDER — BUPIVACAINE HCL (PF) 0.5 % IJ SOLN
INTRAMUSCULAR | Status: DC | PRN
  Administered 2020-07-15: 20 mL

## 2020-07-15 MED ORDER — CHLORHEXIDINE GLUCONATE 0.12 % MT SOLN
15.0000 mL | Freq: Once | OROMUCOSAL | Status: AC
  Administered 2020-07-15: 15 mL via OROMUCOSAL
  Filled 2020-07-15: qty 15

## 2020-07-15 MED ORDER — ACETAMINOPHEN 500 MG PO TABS
ORAL_TABLET | ORAL | Status: AC
  Filled 2020-07-15: qty 2

## 2020-07-15 MED ORDER — AMISULPRIDE (ANTIEMETIC) 5 MG/2ML IV SOLN: 10.0000 mg | Freq: Once | INTRAVENOUS | Status: DC | PRN

## 2020-07-15 MED ORDER — ONDANSETRON HCL 4 MG/2ML IJ SOLN
INTRAMUSCULAR | Status: DC | PRN
  Administered 2020-07-15: 4 mg via INTRAVENOUS

## 2020-07-15 MED ORDER — PHENYLEPHRINE HCL (PRESSORS) 10 MG/ML IV SOLN
INTRAVENOUS | Status: DC | PRN
  Administered 2020-07-15: 40 ug via INTRAVENOUS
  Administered 2020-07-15 (×3): 120 ug via INTRAVENOUS

## 2020-07-15 MED ORDER — PROPOFOL 10 MG/ML IV BOLUS
INTRAVENOUS | Status: DC | PRN
Start: 2020-07-15 — End: 2020-07-15
  Administered 2020-07-15: 300 mg via INTRAVENOUS

## 2020-07-15 SURGICAL SUPPLY — 13 items
CATH ROBINSON RED A/P 16FR (CATHETERS) ×3 IMPLANT
GLOVE BIO SURGEON STRL SZ7 (GLOVE) ×3 IMPLANT
GLOVE SURG UNDER POLY LF SZ7 (GLOVE) ×6 IMPLANT
GOWN STRL REUS W/ TWL LRG LVL3 (GOWN DISPOSABLE) ×2 IMPLANT
GOWN STRL REUS W/ TWL XL LVL3 (GOWN DISPOSABLE) ×2 IMPLANT
GOWN STRL REUS W/TWL LRG LVL3 (GOWN DISPOSABLE) ×3
GOWN STRL REUS W/TWL XL LVL3 (GOWN DISPOSABLE) ×3
KIT PROCEDURE FLUENT (KITS) ×3 IMPLANT
PACK VAGINAL MINOR WOMEN LF (CUSTOM PROCEDURE TRAY) ×3 IMPLANT
PAD OB MATERNITY 4.3X12.25 (PERSONAL CARE ITEMS) ×3 IMPLANT
SCOPETTES 8  STERILE (MISCELLANEOUS) ×3
SCOPETTES 8 STERILE (MISCELLANEOUS) IMPLANT
TOWEL GREEN STERILE FF (TOWEL DISPOSABLE) ×6 IMPLANT

## 2020-07-15 NOTE — Op Note (Signed)
07/15/2020  11:52 AM  PATIENT:  Leah Hampton  65 y.o. female  PRE-OPERATIVE DIAGNOSIS:  Post menopausal bleeding; abnormal PAP smear- LGSIL  POST-OPERATIVE DIAGNOSIS:  Post menopausal bleeding; abnormal PAP smear- LGSIL; polyps  PROCEDURE:  Procedure(s): DILATATION AND CURETTAGE /HYSTEROSCOPY (N/A) COLPOSCOPY (N/A) POLYPECTOMY with Myosure  SURGEON:  Surgeon(s) and Role:    * Lavonia Drafts, MD - Primary  ANESTHESIA:   general and paracervical block  EBL:  10 mL   BLOOD ADMINISTERED:none  DRAINS: none   LOCAL MEDICATIONS USED:  MARCAINE     SPECIMEN:  Source of Specimen:  endometrial curettings and endocervical curettage    DISPOSITION OF SPECIMEN:  PATHOLOGY  COUNTS:  YES  TOURNIQUET:  * No tourniquets in log *  DICTATION: .Note written in EPIC  PLAN OF CARE: Discharge to home after PACU  PATIENT DISPOSITION:  PACU - hemodynamically stable.   Delay start of Pharmacological VTE agent (>24hrs) due to surgical blood loss or risk of bleeding: yes  Complications: none immediate  INDICATIONS: 65 y.o. G2P2000  here for scheduled surgery for LGSIL on PAP and post menopausal bleeding. Despite a neg endometrial biopsy the endometrium was thickened and the patient continued to have spotting of light amount of blood. Risks of surgery were discussed with the patient including but not limited to: bleeding which may require transfusion; infection which may require antibiotics; injury to uterus or surrounding organs; intrauterine scarring which may impair future fertility; need for additional procedures including laparotomy or laparoscopy; and other postoperative/anesthesia complications. Written informed consent was obtained.    FINDINGS:  A 8 week size uterus.  Diffuse proliferative endometrium; mulitiple polyps noted. Normal ostia bilaterally.  PROCEDURE DETAILS:    Colposcopy: The patient was taken to the operating room where general anesthesia was administered  and was found to be adequate.  After an adequate timeout was performed, she was placed in the dorsal lithotomy position and examined; Cervix viewed with speculum and colposcope after application of acetic acid.   Colposcopy adequate?  yes [place] lesions? yes Punctation? no Mosaicism?  no Abnormal vasculature?  no Biopsies? no ECC? yes  Pt was then prepped and draped in the sterile manner.   Her bladder was catheterized for an unmeasured amount of clear, yellow urine. A speculum was then placed in the patient's vagina and a single tooth tenaculum was applied to the anterior lip of the cervix.   A paracervical block using 30 ml of 0.5% Marcaine was administered.  The cervix was sounded to 8 cm and dilated manually with metal dilators to accommodate the Myosure operative hysteroscope.  Once the cervix was dilated, the hysteroscope was inserted under direct visualization using LR as a suspension medium.  The uterine cavity was carefully examined, both ostia were recognized, and diffusely proliferative endometrium with what appeared to be multiple polyps above were noted.  A sharp curettage was then performed to obtain a moderate amount of endometrial curettings. These were removed with sharp curettage and was resected using the Myosure device.  After further careful visualization of the uterine cavity, the hysteroscope was removed under direct visualization.   The tenaculum was removed from the anterior lip of the cervix and the vaginal speculum was removed after noting good hemostasis.  The patient tolerated the procedure well and was taken to the recovery area awake, extubated and in stable condition.  The patient will be discharged to home as per PACU criteria.  Routine postoperative instructions given.  She will follow up in the  clinic in 4 weeks for postoperative evaluation.  Marte Celani L. Harraway-Smith, M.D., [name]

## 2020-07-15 NOTE — Discharge Instructions (Signed)
Dilation and Curettage or Vacuum Curettage, Care After  This sheet gives you information about how to care for yourself after your procedure. Your health care provider may also give you more specific instructions. If you have problems or questions, contact your health care provider.  What can I expect after the procedure?  After the procedure, it is common to have:  · Mild pain or cramping.  · Some vaginal bleeding or spotting.  These may last for up to 2 weeks after your procedure.  Follow these instructions at home:  Medicines  · Take over-the-counter and prescription medicines only as told by your health care provider. This is especially important if you take blood-thinning medicine.  · Ask your health care provider if the medicine prescribed to you requires you to avoid driving or using machinery.  Activity  · If you were given a sedative during the procedure, it can affect you for several hours. Do not drive or operate machinery until your health care provider says that it is safe.  · Rest as told by your health care provider.  · Avoid sitting for a long time without moving. Get up to take short walks every 1-2 hours. This is important to improve blood flow and breathing. Ask for help if you feel weak or unsteady.  · Do not lift anything that is heavier than 10 lb (4.5 kg), or the limit that you are told, until your health care provider says that it is safe.  · Return to your normal activities as told by your health care provider. Ask your health care provider what activities are safe for you.  Lifestyle  For at least 2 weeks, or as long as told by your health care provider, do not:  · Douche.  · Use tampons.  · Have sex.  General instructions  · Wear compression stockings as told by your health care provider. These stockings help to prevent blood clots and reduce swelling in your legs.  · It is up to you to get the results of your procedure. Ask your health care provider, or the department that is doing the  procedure, when your results will be ready.  · Keep all follow-up visits as told by your health care provider. This is important.  Contact a health care provider if:  · You have severe cramps that get worse or that do not get better with medicine.  · You have severe pain in the abdomen.  · You cannot drink fluids without vomiting.  · You develop pain in a different area of your pelvis.  · You have bad-smelling discharge from the vagina.  · You have a rash.  Get help right away if:  · You have vaginal bleeding that soaks more than one sanitary pad in 1 hour for 2 hours in a row, or you pass large clots from your vagina.  · You have a fever that is above 100.4°F (38.0°C).  · Your abdomen feels very tender or hard.  · You have chest pain.  · You have shortness of breath.  · You feel dizzy or light-headed, or you faint.  · You have pain in your neck or shoulder area.  These symptoms may represent a serious problem that is an emergency. Do not wait to see if the symptoms will go away. Get medical help right away. Call your local emergency services (911 in the [place] Do not drive yourself to the hospital.  Summary  · After your procedure, it is common   to have mild pain and bleeding or spotting that may last for up to 2 weeks.  · Rest as told. Avoid sitting for a long time without moving. Get up to take short walks every 1-2 hours.  · Do not lift anything that is heavier than 10 lb (4.5 kg), or the limit that you are told.  · Monitor yourself for any complications that may develop after the procedure.  · Keep all follow-up visits as told by your health care provider. Know the symptoms for which you should get help right away.  This information is not intended to replace advice given to you by your health care provider. Make sure you discuss any questions you have with your health care provider.  Document Revised: 06/05/2019 Document Reviewed: 06/05/2019  Elsevier Patient Education © 2021 [employer]

## 2020-07-15 NOTE — H&P (Signed)
Preoperative History and Physical  Leah Hampton is a 65 y.o. G2P2000 here for surgical management of post menopausal bleeding and abnormal PAP smear (LGSIL).  Thickened endometrium despite normal endometrial biopsy.   Proposed surgery: hysteroscopy with dilation and curettage and colposcopy  Past Medical History:  Diagnosis Date  . Bronchitis   . Chronic bronchitis ([place])   . Cystitis   . Diabetes type 2, controlled ([place])   . Hematuria    urologice eval, Dr Estill Dooms  . History of chest pain   . Hyperbilirubinemia   . Hyperlipemia   . Hypertelorism   . Hypertension   . Leukocytosis   . Obesity   . Osteoarthritis    right knee  . Sleep apnea 2012   slight   . Vaginal Pap smear, abnormal   . Vitamin D deficiency 02/09/2018   Past Surgical History:  Procedure Laterality Date  . COLONOSCOPY WITH PROPOFOL N/A 03/27/2015   Procedure: COLONOSCOPY WITH PROPOFOL;  Surgeon: Milus Banister, MD;  Location: [facility];  Service: Endoscopy;  Laterality: N/A;  . DILATATION & CURETTAGE/HYSTEROSCOPY WITH MYOSURE N/A 01/05/2017   Procedure: DILATATION & CURETTAGE/HYSTEROSCOPY WITH MYOSURE;  Surgeon: Thurnell Lose, MD;  Location: [facility];  Service: Gynecology;  Laterality: N/A;  PostMenopausal Bleeding  . HYSTEROSCOPY WITH D & C N/A 05/22/2019   Procedure: DILATATION AND CURETTAGE /HYSTEROSCOPY, POLYPECTOMY WITH MYOSURE;  Surgeon: Lavonia Drafts, MD;  Location: [facility];  Service: Gynecology;  Laterality: N/A;  . KNEE ARTHROSCOPY  04/2007   left knee  . TOTAL KNEE ARTHROPLASTY Left 10/2008   Dr Roxine Caddy   OB History    Gravida  2   Para  2   Term  2   Preterm      AB      Living        SAB      IAB      Ectopic      Multiple      Live Births  2          Patient denies any cervical dysplasia or STIs. Medications Prior to Admission  Medication Sig Dispense Refill Last Dose  . albuterol (VENTOLIN HFA) 108 (90 Base) MCG/ACT inhaler USE 2 INHALATIONS BY MOUTH   EVERY 6 HOURS AS NEEDED FOR WHEEZING OR SHORTNESS OF  BREATH (Patient taking differently: Inhale 2 puffs into the lungs every 6 (six) hours as needed for wheezing (Cough).) 34 g 3   . diclofenac Sodium (VOLTAREN) 1 % GEL Apply 2 g topically daily as needed (Pain).     . Multiple Vitamin (MULTIVITAMIN WITH MINERALS) TABS tablet Take 1 tablet by mouth daily. Vitafusion     . Multiple Vitamins-Minerals (MULTIVITAMIN WITH MINERALS) tablet Take 1 tablet by mouth daily.     . naproxen sodium (ALEVE) 220 MG tablet Take 440 mg by mouth daily as needed (Shoulder pain).     Marland Kitchen omeprazole (PRILOSEC) 20 MG capsule Take 1 capsule (20 mg total) by mouth daily. 90 capsule 1   . polyethylene glycol (MIRALAX / GLYCOLAX) 17 g packet Take 17 g by mouth daily as needed for moderate constipation or severe constipation.     . potassium chloride SA (KLOR-CON) 20 MEQ tablet TAKE 1 TABLET(20 MEQ) BY MOUTH DAILY (Patient taking differently: Take 20 mEq by mouth daily.) 90 tablet 1   . pravastatin (PRAVACHOL) 80 MG tablet Take 1 tablet (80 mg total) by mouth daily. 90 tablet 1   . psyllium (METAMUCIL) 58.6 % powder Take  1 packet by mouth daily as needed (constipation).     . valACYclovir (VALTREX) 1000 MG tablet TAKE 1 TABLET BY MOUTH  DAILY (Patient taking differently: Take 1,000 mg by mouth daily.) 90 tablet 1   . blood glucose meter kit and supplies KIT Dispense based on patient and insurance preference. Use up to four times daily as directed. (FOR ICD-9 250.00, 250.01). 1 each 0   . Ciclopirox 0.77 % gel Apply 1 application topically 2 (two) times daily. (Patient not taking: Reported on 07/08/2020) 45 g 0 Not Taking at Unknown time  . FLOVENT HFA 110 MCG/ACT inhaler USE 2 INHALATIONS BY MOUTH  TWICE DAILY 36 g 1   . losartan-hydrochlorothiazide (HYZAAR) 100-25 MG tablet TAKE 1 TABLET BY MOUTH  DAILY 90 tablet 1   . Zoster Vaccine Adjuvanted [facility]) injection Inject 0.34m IM now and again in 2-6 months. 0.5 mL 1      Allergies  Allergen Reactions  . Sulfamethoxazole-Trimethoprim [Bactrim] Itching  . Codeine Nausea And Vomiting and Anxiety   Social History:   reports that she has never smoked. She has never used smokeless tobacco. She reports that she does not drink alcohol and does not use drugs. Family History  Problem Relation Age of Onset  . Heart failure Mother   . Arthritis Mother   . Hypertension Mother   . Stroke Mother   . Diabetes Mellitus II Mother   . Heart attack Father 767 . Diabetes Other   . Arthritis Other   . Stroke Other   . Coronary artery disease Other   . Hypertension Sister        x 2  . CVA Sister   . CAD Sister 510 . Diabetes Mellitus II Sister   . Cirrhosis Brother   . Alcohol abuse Brother   . Colon cancer Neg Hx     Review of Systems: Noncontributory  PHYSICAL EXAM: Blood pressure 132/67, pulse 92, temperature 97.9 F (36.6 C), resp. rate 20, height 5' 2"  (1.575 m), weight (!) 142.9 kg, last menstrual period 11/10/2011, SpO2 98 %. General appearance - alert, well appearing, and in no distress Chest - clear to auscultation, no wheezes, rales or rhonchi, symmetric air entry Heart - normal rate and regular rhythm Abdomen - soft, nontender, nondistended, no masses or organomegaly Pelvic - examination not indicated Extremities - peripheral pulses normal, no pedal edema, no clubbing or cyanosis  Labs: Results for orders placed or performed during the hospital encounter of 07/15/20 (from the past 336 hour(s))  Glucose, capillary   Collection Time: 07/15/20  8:54 AM  Result Value Ref Range   Glucose-Capillary 108 (H) 70 - 99 mg/dL  Results for orders placed or performed during the hospital encounter of 07/12/20 (from the past 336 hour(s))  SARS CORONAVIRUS 2 (TAT 6-24 HRS) Nasopharyngeal Nasopharyngeal Swab   Collection Time: 07/12/20  9:25 AM   Specimen: Nasopharyngeal Swab  Result Value Ref Range   SARS Coronavirus 2 NEGATIVE NEGATIVE  06/09/2020 Negative     Adequacy Satisfactory for evaluation; transformation zone component PRESENT.   Comment Normal Reference Range HPV - Negative   Diagnosis - Low grade squamous intraepithelial lesion (LSIL)Abnormal    06/09/2020 ENDOMETRIUM, BIOPSY:  - Benign polypoid endometrium  - Negative for hyperplasia or malignancy   Imaging Studies: UK[place]PELVIC COMPLETE WITH TRANSVAGINAL  Result Date: 06/17/2020 CLINICAL DATA:  Postmenopausal bleeding EXAM: TRANSABDOMINAL AND TRANSVAGINAL ULTRASOUND OF PELVIS TECHNIQUE: Both transabdominal and transvaginal ultrasound examinations of the pelvis were performed.  Transabdominal technique was performed for global imaging of the pelvis including uterus, ovaries, adnexal regions, and pelvic cul-de-sac. It was necessary to proceed with endovaginal exam following the transabdominal exam to visualize the uterus, endometrium, ovaries and adnexa. COMPARISON:  05/08/2019 FINDINGS: Uterus Measurements: 9.1 x 5.0 x 5.2 cm = volume: 125 mL. Right fundal fibroid measures up to 11 mm. Mildly heterogeneous echotexture. Scattered punctate calcifications in the uterus. Endometrium Thickness: Thickened, 13 mm.  No focal abnormality visualized. Right ovary Measurements: Not visualized.  No adnexal mass seen. Left ovary Measurements: Not visualized.  No adnexal mass seen. Other findings No abnormal free fluid. IMPRESSION: Endometrium thickened at 13 mm. In the setting of post-menopausal bleeding, endometrial sampling is indicated to exclude carcinoma. If results are benign, sonohysterogram should be considered for focal lesion work-up. (Ref: Radiological Reasoning: Algorithmic Workup of Abnormal Vaginal Bleeding with Endovaginal Sonography and Sonohysterography. AJR 2008; 161:W96-04) Small right fundal fibroid. Electronically Signed   By: Rolm Baptise M.D.   On: 06/17/2020 10:18    Assessment: Patient Active Problem List   Diagnosis Date Noted  . Low grade squamous intraepithelial lesion (LGSIL) on  cervical Pap smear 07/07/2020  . Upper abdominal pain 08/20/2019  . Right hip pain 05/28/2019  . Post-menopausal bleeding 05/16/2019  . Greater trochanteric pain syndrome of left lower extremity 03/20/2019  . Diabetes type 2, controlled ([place]) 05/18/2018  . Vitamin D deficiency 02/09/2018  . Onychomycosis 09/19/2017  . Tinea pedis of both feet 09/19/2017  . Obstructive chronic bronchitis with exacerbation ([place]) 05/23/2017  . Genital herpes 05/23/2012  . Stress incontinence 03/04/2012  . Osteoarthritis of right knee 10/12/2011  . CTS (carpal tunnel syndrome) 07/13/2011  . OSA (obstructive sleep apnea) 12/10/2010  . Menorrhagia 10/07/2010  . BACK PAIN, LUMBAR 08/01/2009  . Obesity 07/31/2008  . Hyperlipidemia 07/03/2008  . Essential hypertension 07/03/2008  . HYPERBILIRUBINEMIA 07/03/2008    Plan: Patient will undergo surgical management with hysteroscopy with dilation and curettage and colposcopy.   The risks of surgery were discussed in detail with the patient including but not limited to: bleeding which may require transfusion or reoperation; infection which may require antibiotics; injury to surrounding organs which may involve bowel, bladder, ureters ; need for additional procedures including laparoscopy or laparotomy; thromboembolic phenomenon, surgical site problems and other postoperative/anesthesia complications. Likelihood of success in alleviating the patient's condition was discussed. Routine postoperative instructions will be reviewed with the patient and her family in detail after surgery.  The patient concurred with the proposed plan, giving informed written consent for the surgery.  Patient has been NPO since last night she will remain NPO for procedure.  Anesthesia and OR aware.  Preoperative prophylactic antibiotics and SCDs ordered on call to the OR.  To OR when ready.  Gautham Hewins L. Harraway-Smith, M.D., [facility] 07/15/2020 9:09 AM

## 2020-07-15 NOTE — Anesthesia Procedure Notes (Signed)
Procedure Name: LMA Insertion Date/Time: 07/15/2020 10:45 AM Performed by: Babs Bertin, CRNA Pre-anesthesia Checklist: Patient identified, Emergency Drugs available, Suction available and Patient being monitored Patient Re-evaluated:Patient Re-evaluated prior to induction Oxygen Delivery Method: Circle System Utilized Preoxygenation: Pre-oxygenation with 100% oxygen Induction Type: IV induction Ventilation: Mask ventilation without difficulty LMA: LMA inserted LMA Size: 4.0 Number of attempts: 1 Airway Equipment and Method: Bite block Placement Confirmation: positive ETCO2 Tube secured with: Tape Dental Injury: Teeth and Oropharynx as per pre-operative assessment

## 2020-07-15 NOTE — Transfer of Care (Signed)
Immediate Anesthesia Transfer of Care Note  Patient: Leah Hampton  Procedure(s) Performed: DILATATION AND CURETTAGE /HYSTEROSCOPY (N/A Uterus) COLPOSCOPY (N/A Uterus) POLYPECTOMY with Myosure (Uterus)  Patient Location: PACU  Anesthesia Type:General  Level of Consciousness: awake, alert  and oriented  Airway & Oxygen Therapy: Patient Spontanous Breathing and Patient connected to face mask oxygen  Post-op Assessment: Report given to RN and Post -op Vital signs reviewed and stable  Post vital signs: Reviewed and stable  Last Vitals:  Vitals Value Taken Time  BP    Temp    Pulse    Resp    SpO2      Last Pain:  Vitals:   07/15/20 0936  TempSrc:   PainSc: 6       Patients Stated Pain Goal: 2 (65/79/03 8333)  Complications: No complications documented.

## 2020-07-15 NOTE — Brief Op Note (Signed)
07/15/2020  11:52 AM  PATIENT:  Leah Hampton  65 y.o. female  PRE-OPERATIVE DIAGNOSIS:  Post menopausal bleeding; abnormal PAP smear- LGSIL  POST-OPERATIVE DIAGNOSIS:  Post menopausal bleeding; abnormal PAP smear- LGSIL; polyps   PROCEDURE:  Procedure(s): DILATATION AND CURETTAGE /HYSTEROSCOPY (N/A) COLPOSCOPY (N/A) POLYPECTOMY with Myosure  SURGEON:  Surgeon(s) and Role:    * Lavonia Drafts, MD - Primary  ANESTHESIA:   general and paracervical block  EBL:  10 mL   BLOOD ADMINISTERED:none  DRAINS: none   LOCAL MEDICATIONS USED:  MARCAINE     SPECIMEN:  Source of Specimen:  endometrial curettings and endocervical curettage    DISPOSITION OF SPECIMEN:  PATHOLOGY  COUNTS:  YES  TOURNIQUET:  * No tourniquets in log *  DICTATION: .Note written in EPIC  PLAN OF CARE: Discharge to home after PACU  PATIENT DISPOSITION:  PACU - hemodynamically stable.   Delay start of Pharmacological VTE agent (>24hrs) due to surgical blood loss or risk of bleeding: yes  Complications: none immediate  Carolyn L. Harraway-Smith, M.D., [name]

## 2020-07-15 NOTE — Anesthesia Preprocedure Evaluation (Signed)
Anesthesia Evaluation  Patient identified by MRN, date of birth, ID band Patient awake    Reviewed: Allergy & Precautions, NPO status , Patient's Chart, lab work & pertinent test results  Airway Mallampati: III  TM Distance: >3 FB Neck ROM: Full    Dental  (+) Dental Advisory Given   Pulmonary sleep apnea , COPD,    breath sounds clear to auscultation       Cardiovascular hypertension, Pt. on medications  Rhythm:Regular Rate:Normal     Neuro/Psych negative neurological ROS     GI/Hepatic negative GI ROS, Neg liver ROS,   Endo/Other  diabetes, Type 2Morbid obesity  Renal/GU negative Renal ROS     Musculoskeletal  (+) Arthritis ,   Abdominal   Peds  Hematology negative hematology ROS (+)   Anesthesia Other Findings   Reproductive/Obstetrics                             Anesthesia Physical Anesthesia Plan  ASA: III  Anesthesia Plan: General   Post-op Pain Management:    Induction: Intravenous  PONV Risk Score and Plan: 3 and Dexamethasone, Ondansetron and Treatment may vary due to age or medical condition  Airway Management Planned: Oral ETT  Additional Equipment: None  Intra-op Plan:   Post-operative Plan: Extubation in OR  Informed Consent: I have reviewed the patients History and Physical, chart, labs and discussed the procedure including the risks, benefits and alternatives for the proposed anesthesia with the patient or authorized representative who has indicated his/her understanding and acceptance.     Dental advisory given  Plan Discussed with: CRNA  Anesthesia Plan Comments:         Anesthesia Quick Evaluation

## 2020-07-15 NOTE — Anesthesia Postprocedure Evaluation (Signed)
Anesthesia Post Note  Patient: Leah Hampton  Procedure(s) Performed: DILATATION AND CURETTAGE /HYSTEROSCOPY (N/A Uterus) COLPOSCOPY (N/A Uterus) POLYPECTOMY with Myosure (Uterus)     Patient location during evaluation: PACU Anesthesia Type: General Level of consciousness: awake and alert Pain management: pain level controlled Vital Signs Assessment: post-procedure vital signs reviewed and stable Respiratory status: spontaneous breathing, nonlabored ventilation, respiratory function stable and patient connected to nasal cannula oxygen Cardiovascular status: blood pressure returned to baseline and stable Postop Assessment: no apparent nausea or vomiting Anesthetic complications: no   No complications documented.  Last Vitals:  Vitals:   07/15/20 1152 07/15/20 1207  BP: (!) 115/50 (!) 116/97  Pulse: 93 86  Resp: 16 19  Temp:  (!) 36.2 C  SpO2: 96% 97%    Last Pain:  Vitals:   07/15/20 1207  TempSrc:   PainSc: 3                  Tiajuana Amass

## 2020-07-16 ENCOUNTER — Encounter (HOSPITAL_COMMUNITY): Payer: Self-pay | Admitting: Obstetrics & Gynecology

## 2020-07-16 LAB — SURGICAL PATHOLOGY

## 2020-07-24 ENCOUNTER — Encounter: Payer: Medicare Other | Attending: General Surgery | Admitting: Skilled Nursing Facility1

## 2020-07-24 ENCOUNTER — Other Ambulatory Visit: Payer: Self-pay

## 2020-07-24 ENCOUNTER — Other Ambulatory Visit: Payer: Self-pay | Admitting: Family

## 2020-07-24 DIAGNOSIS — E669 Obesity, unspecified: Secondary | ICD-10-CM | POA: Diagnosis present

## 2020-07-24 DIAGNOSIS — E119 Type 2 diabetes mellitus without complications: Secondary | ICD-10-CM | POA: Insufficient documentation

## 2020-07-24 NOTE — Progress Notes (Signed)
Supervised Weight Loss Visit Bariatric Nutrition Education  Planned Surgery: Sleeve  Pt Expectation of Surgery/ Goals: weight loss  5 out of 6 SWL Appointments    NUTRITION ASSESSMENT  Anthropometrics  Start weight at [facility]: 312 lbs (date: 02/20/2020) Today's weight: 315.1 pounds BMI: 57.63 kg/m2    Lifestyle & Dietary Hx  Pt arrives stating she has a lot of knee pain limiting her mobility. Pt states he thinks she could have done better this past month.  Pt states he was not able to eat 3 meals. Pt states she was able to not buy any chips or cheetoes. Pt states she has been trying to drink more water.  Pt states she has been logging her foods stating it helps her some. Pt states she does not like a lot of non starchy vegetables stating she wants to try to eat some different ones. Pt states she thinks she will get a sectioned plate.   Pt states she got a D n C last week. Pt states she has been baking instead of frying her meats and she enjoys it. Pt states she still writes what she eats sometimes.   Estimated daily fluid intake: 48-64 oz Current average weekly physical activity: some walking Dignoses: diabetes (A1C 6.6), OSA  24-Hr Dietary Recall First Meal 11am: oatmeal + toast or oatmeal + bacon + egg + cheese or skipped Snack:  Second Meal: fish sticks + tater tots or skipped or hot dogs and french fries or baked spaghetti + bread sticks Snack: nuts Third Meal 7-8pm: vegetable soup + [place] & ham sandwich or hamburger + beans or noodles + chicken + beans or steak + rice + green beans Snack: "junk food"  Beverages: water, coffee + sugar creamer, hot tea w/ honey, some soda   NUTRITION DIAGNOSIS  Overweight/obesity ([place]-3.3) related to past poor dietary habits and physical inactivity as evidenced by patient w/ planned Sleeve surgery following dietary guidelines for continued weight loss.   NUTRITION INTERVENTION  Nutrition counseling (C-1) and education (E-2) to facilitate  bariatric surgery goals.  Pre-Op Goals Progress & New Goals . Working on eating at least 3 times per day  . Tracking food intake each day on paper  . Found appropriate protein shakes  . Meeting daily protein goal . Continue: to work on drinking at least 64 ounces of fluid daily  . Continue: including 3 things with lunch/dinner: veggies (1/2 of meal), carbs, and protein  . Continue: Practice chewing your food very thoroughly  . NEW/continue: try some different non starchy vegetables not cooking with grease and butter 9not working on this) . NEW/Continue: try the sugar free creamer in your coffee: did not buy any yet . NEW: use the recipe website to find some healthy recipes . NEW: try fat free plain greek yogurt instead of sour cream  Handouts Previously Provided Include   Detailed MyPlate  Learning Style & Readiness for Change Teaching method utilized: Visual & Auditory  Demonstrated degree of understanding via: Teach Back  Barriers to learning/adherence to lifestyle change: None Identified   MONITORING & EVALUATION Dietary intake, weekly physical activity, body weight, and pre-op goals in 1 month.   Next Steps  Patient is to return to [facility]

## 2020-07-25 ENCOUNTER — Encounter: Payer: Self-pay | Admitting: Cardiology

## 2020-07-25 ENCOUNTER — Other Ambulatory Visit: Payer: Self-pay

## 2020-07-25 ENCOUNTER — Ambulatory Visit (INDEPENDENT_AMBULATORY_CARE_PROVIDER_SITE_OTHER): Payer: Medicare Other | Admitting: Cardiology

## 2020-07-25 VITALS — BP 100/60 | HR 96 | Ht 62.0 in | Wt 314.0 lb

## 2020-07-25 DIAGNOSIS — I10 Essential (primary) hypertension: Secondary | ICD-10-CM

## 2020-07-25 DIAGNOSIS — E785 Hyperlipidemia, unspecified: Secondary | ICD-10-CM | POA: Diagnosis not present

## 2020-07-25 DIAGNOSIS — R079 Chest pain, unspecified: Secondary | ICD-10-CM

## 2020-07-25 DIAGNOSIS — Z01818 Encounter for other preprocedural examination: Secondary | ICD-10-CM

## 2020-07-25 NOTE — Patient Instructions (Signed)
Medication Instructions:  Your physician recommends that you continue on your current medications as directed. Please refer to the Current Medication list given to you today.  *If you need a refill on your cardiac medications before your next appointment, please call your pharmacy*  Testing/Procedures: Your physician has requested that you have a [place] (2 day at [facility]). For further information please visit [email or web address]. Please follow instruction sheet, as given.   How to prepare for your Myocardial Perfusion Test:  Do not eat or drink 3 hours prior to your test, except you may have water.  Do not consume products containing caffeine (regular or decaffeinated) 12 hours prior to your test. (ex: coffee, chocolate, sodas, tea).  Do bring a list of your current medications with you.  If not listed below, you may take your medications as normal.  Do wear comfortable clothes (no dresses or overalls) and walking shoes, tennis shoes preferred (No heels or open toe shoes are allowed).  Do NOT wear cologne, perfume, aftershave, or lotions (deodorant is allowed).  The test will take approximately 3 to 4 hours to complete  If these instructions are not followed, your test will have to be rescheduled.  CT coronary calcium score. This test is done at [address]. This is $99 out of pocket.   Coronary CalciumScan A coronary calcium scan is an imaging test used to look for deposits of calcium and other fatty materials (plaques) in the inner lining of the blood vessels of the heart (coronary arteries). These deposits of calcium and plaques can partly clog and narrow the coronary arteries without producing any symptoms or warning signs. This puts a person at risk for a heart attack. This test can detect these deposits before symptoms develop. Tell a health care provider about:  Any allergies you have.  All medicines you are taking, including vitamins,  herbs, eye drops, creams, and over-the-counter medicines.  Any problems you or family members have had with anesthetic medicines.  Any blood disorders you have.  Any surgeries you have had.  Any medical conditions you have.  Whether you are pregnant or may be pregnant. What are the risks? Generally, this is a safe procedure. However, problems may occur, including:  Harm to a pregnant woman and her unborn baby. This test involves the use of radiation. Radiation exposure can be dangerous to a pregnant woman and her unborn baby. If you are pregnant, you generally should not have this procedure done.  Slight increase in the risk of cancer. This is because of the radiation involved in the test. What happens before the procedure? No preparation is needed for this procedure. What happens during the procedure?  You will undress and remove any jewelry around your neck or chest.  You will put on a hospital gown.  Sticky electrodes will be placed on your chest. The electrodes will be connected to an electrocardiogram (ECG) machine to record a tracing of the electrical activity of your heart.  A CT scanner will take pictures of your heart. During this time, you will be asked to lie still and hold your breath for 2-3 seconds while a picture of your heart is being taken. The procedure may vary among health care providers and hospitals. What happens after the procedure?  You can get dressed.  You can return to your normal activities.  It is up to you to get the results of your test. Ask your health care provider, or the department that is  doing the test, when your results will be ready. Summary  A coronary calcium scan is an imaging test used to look for deposits of calcium and other fatty materials (plaques) in the inner lining of the blood vessels of the heart (coronary arteries).  Generally, this is a safe procedure. Tell your health care provider if you are pregnant or may be  pregnant.  No preparation is needed for this procedure.  A CT scanner will take pictures of your heart.  You can return to your normal activities after the scan is done. This information is not intended to replace advice given to you by your health care provider. Make sure you discuss any questions you have with your health care provider. Document Released: 10/30/2007 Document Revised: 03/22/2016 Document Reviewed: 03/22/2016 Elsevier Interactive Patient Education  2017 [place]: At [facility], you and your health needs are our priority.  As part of our continuing mission to provide you with exceptional heart care, we have created designated Provider Care Teams.  These Care Teams include your primary Cardiologist (physician) and Advanced Practice Providers (APPs -  Physician Assistants and Nurse Practitioners) who all work together to provide you with the care you need, when you need it.  We recommend signing up for the patient portal called "MyChart".  Sign up information is provided on this After Visit Summary.  MyChart is used to connect with patients for Virtual Visits (Telemedicine).  Patients are able to view lab/test results, encounter notes, upcoming appointments, etc.  Non-urgent messages can be sent to your provider as well.   To learn more about what you can do with MyChart, go to [email or web address].    Your next appointment:   3 month(s)  The format for your next appointment:   In Person  Provider:   Oswaldo Milian, MD

## 2020-07-25 NOTE — Progress Notes (Signed)
Cardiology Office Note:    Date:  07/27/2020   ID:  Roman, Sandall 08-30-1955, MRN [id number]  PCP:  Debbrah Alar, NP  Cardiologist:  No primary care provider on file.  Electrophysiologist:  None   Referring MD: Debbrah Alar, NP   Chief Complaint  Patient presents with  . Chest Pain    History of Present Illness:    Leah Hampton is a 65 y.o. female with a hx of T2DM, hypertension, hyperlipidemia, OSA, morbid obesity who presents for follow-up.  She was referred by Dr. Redmond Pulling for preop evaluation prior to bariatric surgery, initially seen on 02/26/20.  She denied any chest pain but reported occasional dyspnea on exertion.  States that she has to walk up a flight of stairs in her home multiple times per day (12 steps).  No smoking history.  Father died of MI in 10s.  Echocardiogram on 03/18/2020 showed normal biventricular function, grade 1 diastolic dysfunction, no significant valvular disease.  Since last clinic visit, denies any lightheadedness, syncope, lower extremity edema, or palpitations.  Does report that she has been having chest pain.  States that has been occurring at least weekly.  Describes dull aching pain on left side of her chest, typically lasts for few minutes.  Can occur at rest.  Also reports she has been having shortness of breath walking upstairs.    Past Medical History:  Diagnosis Date  . Bronchitis   . Chronic bronchitis ([place])   . Cystitis   . Diabetes type 2, controlled ([place])   . Hematuria    urologice eval, Dr Estill Dooms  . History of chest pain   . Hyperbilirubinemia   . Hyperlipemia   . Hypertelorism   . Hypertension   . Leukocytosis   . Obesity   . Osteoarthritis    right knee  . Sleep apnea 2012   slight   . Vaginal Pap smear, abnormal   . Vitamin D deficiency 02/09/2018    Past Surgical History:  Procedure Laterality Date  . CERVICAL POLYPECTOMY  07/15/2020   Procedure: POLYPECTOMY with Myosure;  Surgeon:  Lavonia Drafts, MD;  Location: [facility];  Service: Gynecology;;  . COLONOSCOPY WITH PROPOFOL N/A 03/27/2015   Procedure: COLONOSCOPY WITH PROPOFOL;  Surgeon: Milus Banister, MD;  [facility];  Service: Endoscopy;  Laterality: N/A;  . COLPOSCOPY N/A 07/15/2020   Procedure: COLPOSCOPY;  Surgeon: Lavonia Drafts, MD;  Location: [facility];  Service: Gynecology;  Laterality: N/A;  . DILATATION & CURETTAGE/HYSTEROSCOPY WITH MYOSURE N/A 01/05/2017   Procedure: DILATATION & CURETTAGE/HYSTEROSCOPY WITH MYOSURE;  Surgeon: Thurnell Lose, MD;  Location: [facility];  Service: Gynecology;  Laterality: N/A;  PostMenopausal Bleeding  . HYSTEROSCOPY WITH D & C N/A 05/22/2019   Procedure: DILATATION AND CURETTAGE /HYSTEROSCOPY, POLYPECTOMY WITH MYOSURE;  Surgeon: Lavonia Drafts, MD;  Location: [facility];  Service: Gynecology;  Laterality: N/A;  . HYSTEROSCOPY WITH D & C N/A 07/15/2020   Procedure: DILATATION AND CURETTAGE /HYSTEROSCOPY;  Surgeon: Lavonia Drafts, MD;  Location: [facility];  Service: Gynecology;  Laterality: N/A;  . KNEE ARTHROSCOPY  04/2007   left knee  . TOTAL KNEE ARTHROPLASTY Left 10/2008   Dr Roxine Caddy    Current Medications: Current Meds  Medication Sig  . albuterol (VENTOLIN HFA) 108 (90 Base) MCG/ACT inhaler USE 2 INHALATIONS BY MOUTH  EVERY 6 HOURS AS NEEDED FOR WHEEZING OR SHORTNESS OF  BREATH (Patient taking differently: Inhale 2 puffs into the lungs every 6 (six) hours as needed for wheezing (  Cough).)  . blood glucose meter kit and supplies KIT Dispense based on patient and insurance preference. Use up to four times daily as directed. (FOR ICD-9 250.00, 250.01).  . Ciclopirox 0.77 % gel Apply 1 application topically 2 (two) times daily.  . diclofenac Sodium (VOLTAREN) 1 % GEL Apply 2 g topically daily as needed (Pain).  Marland Kitchen FLOVENT HFA 110 MCG/ACT inhaler USE 2 INHALATIONS BY MOUTH  TWICE DAILY  . ibuprofen (ADVIL) 600 MG tablet Take 1 tablet (600 mg total) by  mouth every 6 (six) hours as needed.  Marland Kitchen losartan-hydrochlorothiazide (HYZAAR) 100-25 MG tablet TAKE 1 TABLET BY MOUTH  DAILY  . Multiple Vitamin (MULTIVITAMIN WITH MINERALS) TABS tablet Take 1 tablet by mouth daily. Vitafusion  . Multiple Vitamins-Minerals (MULTIVITAMIN WITH MINERALS) tablet Take 1 tablet by mouth daily.  Marland Kitchen omeprazole (PRILOSEC) 20 MG capsule Take 1 capsule (20 mg total) by mouth daily.  . polyethylene glycol (MIRALAX / GLYCOLAX) 17 g packet Take 17 g by mouth daily as needed for moderate constipation or severe constipation.  . potassium chloride SA (KLOR-CON) 20 MEQ tablet TAKE 1 TABLET(20 MEQ) BY MOUTH DAILY (Patient taking differently: Take 20 mEq by mouth daily.)  . pravastatin (PRAVACHOL) 80 MG tablet Take 1 tablet (80 mg total) by mouth daily.  . psyllium (METAMUCIL) 58.6 % powder Take 1 packet by mouth daily as needed (constipation).  . valACYclovir (VALTREX) 1000 MG tablet TAKE 1 TABLET BY MOUTH  DAILY  . Zoster Vaccine Adjuvanted [facility]) injection Inject 0.83m IM now and again in 2-6 months.     Allergies:   Sulfamethoxazole-trimethoprim [bactrim] and Codeine   Social History   Socioeconomic History  . Marital status: Single    Spouse name: Not on file  . Number of children: 2  . Years of education: Not on file  . Highest education level: Not on file  Occupational History  . Occupation: disabled  Tobacco Use  . Smoking status: Never Smoker  . Smokeless tobacco: Never Used  Vaping Use  . Vaping Use: Never used  Substance and Sexual Activity  . Alcohol use: No  . Drug use: No  . Sexual activity: Not Currently  Other Topics Concern  . Not on file  Social History Narrative   Regular exercise:  No   Lives alone   Has 2 grown sons, both local.  1 grandson   On disability due to her knee pain   Completed 12th grade   Enjoys television   No pets.    Social Determinants of Health   Financial Resource Strain: Not on file  Food Insecurity: No Food  Insecurity  . Worried About RCharity fundraiserin the Last Year: Never true  . Ran Out of Food in the Last Year: Never true  Transportation Needs: Not on file  Physical Activity: Not on file  Stress: Not on file  Social Connections: Not on file     Family History: The patient's family history includes Alcohol abuse in her brother; Arthritis in her mother and another family member; CAD (age of onset: 550 in her sister; CVA in her sister; Cirrhosis in her brother; Coronary artery disease in an other family member; Diabetes in an other family member; Diabetes Mellitus II in her mother and sister; Heart attack (age of onset: 758 in her father; Heart failure in her mother; Hypertension in her mother and sister; Stroke in her mother and another family member. There is no history of Colon cancer.  ROS:  Please see the history of present illness.     All other systems reviewed and are negative.  EKGs/Labs/Other Studies Reviewed:    The following studies were reviewed today:   EKG:  EKG is not ordered today.  The ekg ordered at prior clinic visit demonstrates normal sinus rhythm, rate 87, no ST abnormalities  Recent Labs: 03/14/2020: ALT 18 07/15/2020: BUN 16; Creatinine, Ser 0.74; Hemoglobin 12.2; Platelets 300; Potassium 3.3; Sodium 141  Recent Lipid Panel    Component Value Date/Time   CHOL 149 06/17/2020 1059   CHOL 171 05/23/2017 1142   TRIG 98.0 06/17/2020 1059   HDL 44.00 06/17/2020 1059   HDL 55 05/23/2017 1142   CHOLHDL 3 06/17/2020 1059   VLDL 19.6 06/17/2020 1059   LDLCALC 86 06/17/2020 1059   LDLCALC 104 (H) 05/23/2017 1142    Physical Exam:    VS:  BP 100/60   Pulse 96   Ht _0  (1.575 m)   Wt (!) 314 lb (142.4 kg)   LMP 11/10/2011   BMI 57.43 kg/m     Wt Readings from Last 3 Encounters:  07/25/20 (!) 314 lb (142.4 kg)  07/24/20 (!) 315 lb 1.6 oz (142.9 kg)  07/15/20 (!) 315 lb (142.9 kg)     GEN:  in no acute distress HEENT: Normal NECK: No JVD; No  carotid bruits LYMPHATICS: No lymphadenopathy CARDIAC: RRR, no murmurs, rubs, gallops RESPIRATORY:  Clear to auscultation without rales, wheezing or rhonchi  ABDOMEN: Soft, non-tender, non-distended MUSCULOSKELETAL: Mild lower extremity edema SKIN: Warm and dry NEUROLOGIC:  Alert and oriented x 3 PSYCHIATRIC:  Normal affect   ASSESSMENT:    1. Pre-op evaluation   2. Chest pain of uncertain etiology   3. Hyperlipidemia, unspecified hyperlipidemia type   4. Essential hypertension   5. Morbid obesity ([place])    PLAN:    Preop evaluation: Prior to bariatric surgery.  Functional capacity appears greater than 4 METS, as reports he can walk up flight of stairs but has shortness of breath with this.  Also reports she has been having chest pain recently.  RCRI score 1 given intraperitoneal surgery.  Echocardiogram on 03/18/2020 showed normal biventricular function, grade 1 diastolic dysfunction, no significant valvular disease.  Given her chest pain and dyspnea on exertion, recommend Lexiscan Myoview prior to surgery.  Chest pain: Atypical in description but does have CAD risk factors (age, hypertension, hyperlipidemia, T2DM).  Will evaluate for ischemia with Lexiscan Myoview as above.  Lower extremity edema: Mild, suspect related to obesity.  Echocardiogram unremarkable as above  Hypertension: On losartan-HCTZ 100-25 mg.  Appears controlled  Hyperlipidemia: On pravastatin 80 mg daily.  LDL 102 on 07/27/2019.  Will check calcium score to guide aggressiveness and lowering cholesterol.  T2DM: A1c 6.5 on 12/12/2019  Morbid obesity:Body mass index is 57.43 kg/m.  Being evaluated for bariatric surgery.  RTC in 3 months   Shared Decision Making/Informed Consent The risks [chest pain, shortness of breath, cardiac arrhythmias, dizziness, blood pressure fluctuations, myocardial infarction, stroke/transient ischemic attack, nausea, vomiting, allergic reaction, radiation exposure, metallic taste  sensation and life-threatening complications (estimated to be 1 in 10,000)], benefits (risk stratification, diagnosing coronary artery disease, treatment guidance) and alternatives of a nuclear stress test were discussed in detail with Ms. Miskell and she agrees to proceed.     Medication Adjustments/Labs and Tests Ordered: Current medicines are reviewed at length with the patient today.  Concerns regarding medicines are outlined above.  Orders Placed This Encounter  Procedures  .  CT CARDIAC SCORING (SELF PAY ONLY)  . MYOCARDIAL PERFUSION IMAGING   No orders of the defined types were placed in this encounter.   Patient Instructions  Medication Instructions:  Your physician recommends that you continue on your current medications as directed. Please refer to the Current Medication list given to you today.  *If you need a refill on your cardiac medications before your next appointment, please call your pharmacy*  Testing/Procedures: Your physician has requested that you have a Scanlon (2 day at [facility]). For further information please visit [email or web address]. Please follow instruction sheet, as given.   How to prepare for your Myocardial Perfusion Test:  Do not eat or drink 3 hours prior to your test, except you may have water.  Do not consume products containing caffeine (regular or decaffeinated) 12 hours prior to your test. (ex: coffee, chocolate, sodas, tea).  Do bring a list of your current medications with you.  If not listed below, you may take your medications as normal.  Do wear comfortable clothes (no dresses or overalls) and walking shoes, tennis shoes preferred (No heels or open toe shoes are allowed).  Do NOT wear cologne, perfume, aftershave, or lotions (deodorant is allowed).  The test will take approximately 3 to 4 hours to complete  If these instructions are not followed, your test will have to be rescheduled.  CT coronary calcium score. This  test is done at [address]. This is $99 out of pocket.   Coronary CalciumScan A coronary calcium scan is an imaging test used to look for deposits of calcium and other fatty materials (plaques) in the inner lining of the blood vessels of the heart (coronary arteries). These deposits of calcium and plaques can partly clog and narrow the coronary arteries without producing any symptoms or warning signs. This puts a person at risk for a heart attack. This test can detect these deposits before symptoms develop. Tell a health care provider about:  Any allergies you have.  All medicines you are taking, including vitamins, herbs, eye drops, creams, and over-the-counter medicines.  Any problems you or family members have had with anesthetic medicines.  Any blood disorders you have.  Any surgeries you have had.  Any medical conditions you have.  Whether you are pregnant or may be pregnant. What are the risks? Generally, this is a safe procedure. However, problems may occur, including:  Harm to a pregnant woman and her unborn baby. This test involves the use of radiation. Radiation exposure can be dangerous to a pregnant woman and her unborn baby. If you are pregnant, you generally should not have this procedure done.  Slight increase in the risk of cancer. This is because of the radiation involved in the test. What happens before the procedure? No preparation is needed for this procedure. What happens during the procedure?  You will undress and remove any jewelry around your neck or chest.  You will put on a hospital gown.  Sticky electrodes will be placed on your chest. The electrodes will be connected to an electrocardiogram (ECG) machine to record a tracing of the electrical activity of your heart.  A CT scanner will take pictures of your heart. During this time, you will be asked to lie still and hold your breath for 2-3 seconds while a picture of your heart is  being taken. The procedure may vary among health care providers and hospitals. What happens after the procedure?  You can get dressed.  You can return to your normal activities.  It is up to you to get the results of your test. Ask your health care provider, or the department that is doing the test, when your results will be ready. Summary  A coronary calcium scan is an imaging test used to look for deposits of calcium and other fatty materials (plaques) in the inner lining of the blood vessels of the heart (coronary arteries).  Generally, this is a safe procedure. Tell your health care provider if you are pregnant or may be pregnant.  No preparation is needed for this procedure.  A CT scanner will take pictures of your heart.  You can return to your normal activities after the scan is done. This information is not intended to replace advice given to you by your health care provider. Make sure you discuss any questions you have with your health care provider. Document Released: 10/30/2007 Document Revised: 03/22/2016 Document Reviewed: 03/22/2016 Elsevier Interactive Patient Education  2017 [place]: At [facility], you and your health needs are our priority.  As part of our continuing mission to provide you with exceptional heart care, we have created designated Provider Care Teams.  These Care Teams include your primary Cardiologist (physician) and Advanced Practice Providers (APPs -  Physician Assistants and Nurse Practitioners) who all work together to provide you with the care you need, when you need it.  We recommend signing up for the patient portal called "MyChart".  Sign up information is provided on this After Visit Summary.  MyChart is used to connect with patients for Virtual Visits (Telemedicine).  Patients are able to view lab/test results, encounter notes, upcoming appointments, etc.  Non-urgent messages can be sent to your provider as well.   To learn  more about what you can do with MyChart, go to [email or web address].    Your next appointment:   3 month(s)  The format for your next appointment:   In Person  Provider:   Oswaldo Milian, MD         Signed, Donato Heinz, MD  07/27/2020 3:06 PM    [place]

## 2020-07-28 ENCOUNTER — Ambulatory Visit (INDEPENDENT_AMBULATORY_CARE_PROVIDER_SITE_OTHER): Payer: Medicare Other | Admitting: Psychology

## 2020-07-28 DIAGNOSIS — F509 Eating disorder, unspecified: Secondary | ICD-10-CM

## 2020-07-29 ENCOUNTER — Other Ambulatory Visit: Payer: Self-pay

## 2020-07-29 ENCOUNTER — Ambulatory Visit (INDEPENDENT_AMBULATORY_CARE_PROVIDER_SITE_OTHER): Payer: Medicare Other | Admitting: Family Medicine

## 2020-07-29 ENCOUNTER — Ambulatory Visit: Payer: Self-pay

## 2020-07-29 ENCOUNTER — Ambulatory Visit (HOSPITAL_BASED_OUTPATIENT_CLINIC_OR_DEPARTMENT_OTHER)
Admission: RE | Admit: 2020-07-29 | Discharge: 2020-07-29 | Disposition: A | Discharge status: Home / Self Care | Payer: Medicare Other | Source: Ambulatory Visit | Attending: Family Medicine | Admitting: Family Medicine

## 2020-07-29 VITALS — BP 120/76 | Ht 62.0 in | Wt 314.0 lb

## 2020-07-29 DIAGNOSIS — M1711 Unilateral primary osteoarthritis, right knee: Secondary | ICD-10-CM

## 2020-07-29 DIAGNOSIS — Z96652 Presence of left artificial knee joint: Secondary | ICD-10-CM

## 2020-07-29 DIAGNOSIS — S83012A Lateral subluxation of left patella, initial encounter: Secondary | ICD-10-CM | POA: Diagnosis not present

## 2020-07-29 DIAGNOSIS — Z96642 Presence of left artificial hip joint: Secondary | ICD-10-CM | POA: Diagnosis not present

## 2020-07-29 DIAGNOSIS — Z471 Aftercare following joint replacement surgery: Secondary | ICD-10-CM | POA: Diagnosis not present

## 2020-07-29 HISTORY — DX: Presence of left artificial knee joint: Z96.652

## 2020-07-29 MED ORDER — TRIAMCINOLONE ACETONIDE 40 MG/ML IJ SUSP
40.0000 mg | Freq: Once | INTRAMUSCULAR | Status: AC
  Administered 2020-07-29: 40 mg via INTRA_ARTICULAR

## 2020-07-29 NOTE — Assessment & Plan Note (Addendum)
Acute on chronic in nature.  Has tried physical therapy and has a medial unloader brace. -Counseled on home exercise therapy and supportive care - injection  -Could consider gel injections. -He is undergoing bypass surgery so may be a candidate for knee replacement in the future.

## 2020-07-29 NOTE — Patient Instructions (Signed)
Good to see you Please try ice as needed  I will call with the results from today   Please send me a message in MyChart with any questions or updates.  Please see me back in 4 weeks.   --Dr. Raeford Razor

## 2020-07-29 NOTE — Progress Notes (Signed)
Leah Hampton - 65 y.o. female MRN [id number]  Date of birth: [birth year]  SUBJECTIVE:  Including CC & ROS.  No chief complaint on file.   Leah Hampton is a 65 y.o. female that is presenting with acute on chronic right knee pain.  Pain is gotten worse over the past few weeks.  Denies any injury or inciting event.  Feels similar to her previous pain.  Has tried using the medial unloader brace.  Started having some left tibial pain and left knee pain.  She has a history of total knee arthroplasty from 2010.Marland Kitchen   Review of Systems See HPI   HISTORY: Past Medical, Surgical, Social, and Family History Reviewed & Updated per EMR.   Pertinent Historical Findings include:  Past Medical History:  Diagnosis Date  . Bronchitis   . Chronic bronchitis ([place])   . Cystitis   . Diabetes type 2, controlled ([place])   . Hematuria    urologice eval, Dr Estill Dooms  . History of chest pain   . Hyperbilirubinemia   . Hyperlipemia   . Hypertelorism   . Hypertension   . Leukocytosis   . Obesity   . Osteoarthritis    right knee  . Sleep apnea 2012   slight   . Vaginal Pap smear, abnormal   . Vitamin D deficiency 02/09/2018    Past Surgical History:  Procedure Laterality Date  . CERVICAL POLYPECTOMY  07/15/2020   Procedure: POLYPECTOMY with Myosure;  Surgeon: Lavonia Drafts, MD;  Location: [facility];  Service: Gynecology;;  . COLONOSCOPY WITH PROPOFOL N/A 03/27/2015   Procedure: COLONOSCOPY WITH PROPOFOL;  Surgeon: Milus Banister, MD;  [facility];  Service: Endoscopy;  Laterality: N/A;  . COLPOSCOPY N/A 07/15/2020   Procedure: COLPOSCOPY;  Surgeon: Lavonia Drafts, MD;  Location: [place];  Service: Gynecology;  Laterality: N/A;  . DILATATION & CURETTAGE/HYSTEROSCOPY WITH MYOSURE N/A 01/05/2017   Procedure: DILATATION & CURETTAGE/HYSTEROSCOPY WITH MYOSURE;  Surgeon: Thurnell Lose, MD;  Location: [facility];  Service: Gynecology;  Laterality: N/A;  PostMenopausal Bleeding  .  HYSTEROSCOPY WITH D & C N/A 05/22/2019   Procedure: DILATATION AND CURETTAGE /HYSTEROSCOPY, POLYPECTOMY WITH MYOSURE;  Surgeon: Lavonia Drafts, MD;  [facility];  Service: Gynecology;  Laterality: N/A;  . HYSTEROSCOPY WITH D & C N/A 07/15/2020   Procedure: DILATATION AND CURETTAGE /HYSTEROSCOPY;  Surgeon: Lavonia Drafts, MD;  Location: [facility];  Service: Gynecology;  Laterality: N/A;  . KNEE ARTHROSCOPY  04/2007   left knee  . TOTAL KNEE ARTHROPLASTY Left 10/2008   Dr Roxine Caddy    Family History  Problem Relation Age of Onset  . Heart failure Mother   . Arthritis Mother   . Hypertension Mother   . Stroke Mother   . Diabetes Mellitus II Mother   . Heart attack Father 84  . Diabetes Other   . Arthritis Other   . Stroke Other   . Coronary artery disease Other   . Hypertension Sister        x 2  . CVA Sister   . CAD Sister 69  . Diabetes Mellitus II Sister   . Cirrhosis Brother   . Alcohol abuse Brother   . Colon cancer Neg Hx     Social History   Socioeconomic History  . Marital status: Single    Spouse name: Not on file  . Number of children: 2  . Years of education: Not on file  . Highest education level: Not on file  Occupational History  .  Occupation: disabled  Tobacco Use  . Smoking status: Never Smoker  . Smokeless tobacco: Never Used  Vaping Use  . Vaping Use: Never used  Substance and Sexual Activity  . Alcohol use: No  . Drug use: No  . Sexual activity: Not Currently  Other Topics Concern  . Not on file  Social History Narrative   Regular exercise:  No   Lives alone   Has 2 grown sons, both local.  1 grandson   On disability due to her knee pain   Completed 12th grade   Enjoys television   No pets.    Social Determinants of Health   Financial Resource Strain: Not on file  Food Insecurity: No Food Insecurity  . Worried About Charity fundraiser in the Last Year: Never true  . Ran Out of Food in the Last Year: Never true   Transportation Needs: Not on file  Physical Activity: Not on file  Stress: Not on file  Social Connections: Not on file  Intimate Partner Violence: Not on file     PHYSICAL EXAM:  VS: BP 120/76 (BP Location: Left Arm, Patient Position: Sitting, Cuff Size: Large)   Ht 5\' 2"  (1.575 m)   Wt (!) 314 lb (142.4 kg)   LMP 11/10/2011   BMI 57.43 kg/m  Physical Exam Gen: NAD, alert, cooperative with exam, well-appearing MSK:  Right knee: No obvious effusion. Normal range of motion. Tenderness to palpation of the medial joint space. Neurovascularly intact   Aspiration/Injection Procedure Note TRAMAINE SNELL [birth year]  Procedure: Injection Indications: Right knee pain  Procedure Details Consent: Risks of procedure as well as the alternatives and risks of each were explained to the (patient/caregiver).  Consent for procedure obtained. Time Out: Verified patient identification, verified procedure, site/side was marked, verified correct patient position, special equipment/implants available, medications/allergies/relevent history reviewed, required imaging and test results available.  Performed.  The area was cleaned with iodine and alcohol swabs.    The right knee superior lateral suprapatellar pouch was injected using 3 cc of 1% lidocaine on a 21-gauge 2 inch needle.  The syringe was switched and a mixture containing 1 cc's of 40 mg Kenalog and 4 cc's of 0.25% bupivacaine was injected.  Ultrasound was used. Images were obtained in long views showing the injection.     A sterile dressing was applied.  Patient did tolerate procedure well.     ASSESSMENT & PLAN:   Osteoarthritis of right knee Acute on chronic in nature.  Has tried physical therapy and has a medial unloader brace. -Counseled on home exercise therapy and supportive care - injection  -Could consider gel injections. -He is undergoing bypass surgery so may be a candidate for knee replacement in the  future.  History of total left knee replacement Having pain of the left tibia/knee.  May be related to the weight gain she is putting more pressure on the left knee due to the pain of the right knee -Counseled on exercise therapy and supportive care - xray  -May need to consider CT scan to evaluate the left tibia.

## 2020-07-29 NOTE — Assessment & Plan Note (Addendum)
Having pain of the left tibia/knee.  May be related to the weight gain she is putting more pressure on the left knee due to the pain of the right knee -Counseled on exercise therapy and supportive care - xray  -May need to consider CT scan to evaluate the left tibia.

## 2020-07-31 ENCOUNTER — Telehealth: Payer: Self-pay | Admitting: Family Medicine

## 2020-07-31 NOTE — Telephone Encounter (Signed)
Left VM for patient. If she calls back please have her speak with a nurse/CMA and inform that the knee cap is migrating to the outside of the knee. This may be demonstrating wear of the surgical hardware.  We may need to pursue a CT scan to further evaluate..   If any questions then please take the best time and phone number to call and I will try to call her back.   Rosemarie Ax, MD [facility] 07/31/2020, 9:21 AM

## 2020-08-11 ENCOUNTER — Ambulatory Visit (INDEPENDENT_AMBULATORY_CARE_PROVIDER_SITE_OTHER): Payer: Medicare Other | Admitting: Psychology

## 2020-08-11 DIAGNOSIS — F509 Eating disorder, unspecified: Secondary | ICD-10-CM

## 2020-08-12 ENCOUNTER — Encounter: Payer: Self-pay | Admitting: Gastroenterology

## 2020-08-12 ENCOUNTER — Ambulatory Visit: Payer: Medicare Other | Admitting: Gastroenterology

## 2020-08-12 ENCOUNTER — Ambulatory Visit (INDEPENDENT_AMBULATORY_CARE_PROVIDER_SITE_OTHER): Payer: Medicare Other | Admitting: Gastroenterology

## 2020-08-12 ENCOUNTER — Other Ambulatory Visit: Payer: Self-pay

## 2020-08-12 VITALS — BP 114/62 | HR 92 | Ht 62.0 in | Wt 309.0 lb

## 2020-08-12 DIAGNOSIS — K5900 Constipation, unspecified: Secondary | ICD-10-CM

## 2020-08-12 DIAGNOSIS — Z86010 Personal history of colonic polyps: Secondary | ICD-10-CM

## 2020-08-12 MED ORDER — NA SULFATE-K SULFATE-MG SULF 17.5-3.13-1.6 GM/177ML PO SOLN: 1.0000 | Freq: Once | ORAL | 0 refills | Status: AC

## 2020-08-12 NOTE — Patient Instructions (Signed)
If you are age 63 or older, your body mass index should be between 23-30. Your Body mass index is 56.52 kg/m. If this is out of the aforementioned range listed, please consider follow up with your Primary Care Provider.  If you are age 4 or younger, your body mass index should be between 19-25. Your Body mass index is 56.52 kg/m. If this is out of the aformentioned range listed, please consider follow up with your Primary Care Provider.   You have been scheduled for a colonoscopy. Please follow written instructions given to you at your visit today.  Please pick up your prep supplies at the pharmacy within the next 1-3 days. If you use inhalers (even only as needed), please bring them with you on the day of your procedure.  Thank you for entrusting me with your care and choosing [facility].  Dr Ardis Hughs

## 2020-08-12 NOTE — Progress Notes (Signed)
Review of pertinent gastrointestinal problems: 1.  Adenomatous colon polyps.  Colonoscopy 2011 three adenomas removed, one was greater than 1 cm.  Colonoscopy 2016 was completely normal. 2.  Morbid obesity, BMI greater than 50   HPI: This is a very pleasant 65 year old woman who was last here in our office about a year ago.  She is towards the end of her evaluation for bariatric gastric sleeve procedure.  She is likely be undergoing that surgery with Dr. Redmond Pulling in the next few months I suspect.  She has mild intermittent constipation which is well treated with either MiraLAX or Metamucil which she takes on a as needed basis.  She has no overt GI bleeding.  She walks with a cane  Her weight is overall stable, morbidly obese  I last saw her about 6 years ago the time of a colonoscopy.  Blood work March 2022 shows normal CBC   Review of systems: Pertinent positive and negative review of systems were noted in the above HPI section. All other review negative.   Past Medical History:  Diagnosis Date  . Bronchitis   . Chronic bronchitis ([place])   . Cystitis   . Diabetes type 2, controlled ([place])   . Hematuria    urologice eval, Dr Estill Dooms  . History of chest pain   . Hyperbilirubinemia   . Hyperlipemia   . Hypertelorism   . Hypertension   . Leukocytosis   . Obesity   . Osteoarthritis    right knee  . Sleep apnea 2012   slight   . Vaginal Pap smear, abnormal   . Vitamin D deficiency 02/09/2018    Past Surgical History:  Procedure Laterality Date  . CERVICAL POLYPECTOMY  07/15/2020   Procedure: POLYPECTOMY with Myosure;  Surgeon: Lavonia Drafts, MD;  Location: [facility];  Service: Gynecology;;  . COLONOSCOPY WITH PROPOFOL N/A 03/27/2015   Procedure: COLONOSCOPY WITH PROPOFOL;  Surgeon: Milus Banister, MD;  Location: [facility];  Service: Endoscopy;  Laterality: N/A;  . COLPOSCOPY N/A 07/15/2020   Procedure: COLPOSCOPY;  Surgeon: Lavonia Drafts, MD;  Location: [facility];  Service: Gynecology;  Laterality: N/A;  . DILATATION & CURETTAGE/HYSTEROSCOPY WITH MYOSURE N/A 01/05/2017   Procedure: DILATATION & CURETTAGE/HYSTEROSCOPY WITH MYOSURE;  Surgeon: Thurnell Lose, MD;  Location: [facility];  Service: Gynecology;  Laterality: N/A;  PostMenopausal Bleeding  . HYSTEROSCOPY WITH D & C N/A 05/22/2019   Procedure: DILATATION AND CURETTAGE /HYSTEROSCOPY, POLYPECTOMY WITH MYOSURE;  Surgeon: Lavonia Drafts, MD;  Location: [facility];  Service: Gynecology;  Laterality: N/A;  . HYSTEROSCOPY WITH D & C N/A 07/15/2020   Procedure: DILATATION AND CURETTAGE /HYSTEROSCOPY;  Surgeon: Lavonia Drafts, MD;  Location: [facility];  Service: Gynecology;  Laterality: N/A;  . KNEE ARTHROSCOPY  04/2007   left knee  . TOTAL KNEE ARTHROPLASTY Left 10/2008   Dr Roxine Caddy    Current Outpatient Medications  Medication Sig Dispense Refill  . albuterol (VENTOLIN HFA) 108 (90 Base) MCG/ACT inhaler USE 2 INHALATIONS BY MOUTH  EVERY 6 HOURS AS NEEDED FOR WHEEZING OR SHORTNESS OF  BREATH (Patient taking differently: Inhale 2 puffs into the lungs every 6 (six) hours as needed for wheezing (Cough).) 34 g 3  . blood glucose meter kit and supplies KIT Dispense based on patient and insurance preference. Use up to four times daily as directed. (FOR ICD-9 250.00, 250.01). 1 each 0  . Ciclopirox 0.77 % gel Apply 1 application topically 2 (two) times daily. 45 g 0  . diclofenac Sodium (  VOLTAREN) 1 % GEL Apply 2 g topically daily as needed (Pain).    Marland Kitchen FLOVENT HFA 110 MCG/ACT inhaler USE 2 INHALATIONS BY MOUTH  TWICE DAILY 36 g 1  . ibuprofen (ADVIL) 600 MG tablet Take 1 tablet (600 mg total) by mouth every 6 (six) hours as needed. 30 tablet 0  . losartan-hydrochlorothiazide (HYZAAR) 100-25 MG tablet TAKE 1 TABLET BY MOUTH  DAILY 90 tablet 1  . Multiple Vitamin (MULTIVITAMIN WITH MINERALS) TABS tablet Take 1 tablet by mouth daily. Vitafusion    . Multiple Vitamins-Minerals (MULTIVITAMIN WITH MINERALS)  tablet Take 1 tablet by mouth daily.    Marland Kitchen omeprazole (PRILOSEC) 20 MG capsule Take 1 capsule (20 mg total) by mouth daily. 90 capsule 1  . polyethylene glycol (MIRALAX / GLYCOLAX) 17 g packet Take 17 g by mouth daily as needed for moderate constipation or severe constipation.    . potassium chloride SA (KLOR-CON) 20 MEQ tablet TAKE 1 TABLET(20 MEQ) BY MOUTH DAILY (Patient taking differently: Take 20 mEq by mouth daily.) 90 tablet 1  . pravastatin (PRAVACHOL) 80 MG tablet Take 1 tablet (80 mg total) by mouth daily. 90 tablet 1  . psyllium (METAMUCIL) 58.6 % powder Take 1 packet by mouth daily as needed (constipation).    . valACYclovir (VALTREX) 1000 MG tablet TAKE 1 TABLET BY MOUTH  DAILY 90 tablet 3   No current facility-administered medications for this visit.    Allergies as of 08/12/2020 - Review Complete 08/12/2020  Allergen Reaction Noted  . Sulfamethoxazole-trimethoprim [bactrim] Itching 08/31/2010  . Codeine Nausea And Vomiting and Anxiety 07/03/2008    Family History  Problem Relation Age of Onset  . Heart failure Mother   . Arthritis Mother   . Hypertension Mother   . Stroke Mother   . Diabetes Mellitus II Mother   . Heart attack Father 23  . Diabetes Other   . Arthritis Other   . Stroke Other   . Coronary artery disease Other   . Hypertension Sister        x 2  . CVA Sister   . CAD Sister 27  . Diabetes Mellitus II Sister   . Cirrhosis Brother   . Alcohol abuse Brother   . Colon cancer Neg Hx     Social History   Socioeconomic History  . Marital status: Single    Spouse name: Not on file  . Number of children: 2  . Years of education: Not on file  . Highest education level: Not on file  Occupational History  . Occupation: disabled  Tobacco Use  . Smoking status: Never Smoker  . Smokeless tobacco: Never Used  Vaping Use  . Vaping Use: Never used  Substance and Sexual Activity  . Alcohol use: No  . Drug use: No  . Sexual activity: Not Currently   Other Topics Concern  . Not on file  Social History Narrative   Regular exercise:  No   Lives alone   Has 2 grown sons, both local.  1 grandson   On disability due to her knee pain   Completed 12th grade   Enjoys television   No pets.    Social Determinants of Health   Financial Resource Strain: Not on file  Food Insecurity: No Food Insecurity  . Worried About Charity fundraiser in the Last Year: Never true  . Ran Out of Food in the Last Year: Never true  Transportation Needs: Not on file  Physical Activity: Not on  file  Stress: Not on file  Social Connections: Not on file  Intimate Partner Violence: Not on file     Physical Exam: BP 114/62   Pulse 92   Ht _0  (1.575 m) Comment: without shoes  Wt (!) 309 lb (140.2 kg)   LMP 11/10/2011   BMI 56.52 kg/m  Constitutional: generally well-appearing, except for morbid obesity.  She walks with a cane Psychiatric: alert and oriented x3 Eyes: extraocular movements intact Mouth: oral pharynx moist, no lesions Neck: supple no lymphadenopathy Cardiovascular: heart regular rate and rhythm Lungs: clear to auscultation bilaterally Abdomen: soft, nontender, nondistended, no obvious ascites, no peritoneal signs, normal bowel sounds Extremities: no lower extremity edema bilaterally Skin: no lesions on visible extremities   Assessment and plan: 65 y.o. female with morbid obesity, personal history of adenomatous colon polyps  I recommended a colonoscopy at her soonest convenience.  It would be safest for [facility] to be [facility] at [facility] given her morbid obesity, BMI greater than 50.  She understands that if it is normal again like her 2016 colonoscopy was then she would probably not need another colonoscopy examination or colon cancer screening for 10 years.  Please see the "Patient Instructions" section for addition details about the plan.   Owens Loffler, MD [facility] 08/12/2020, 8:46 AM  Cc:  Debbrah Alar, NP  Total time on date of encounter was 30  minutes (this included time spent preparing to see the patient reviewing records; obtaining and/or reviewing separately obtained history; performing a medically appropriate exam and/or evaluation; counseling and educating the patient and family if present; ordering medications, tests or procedures if applicable; and documenting clinical information in the health record).

## 2020-08-13 ENCOUNTER — Ambulatory Visit (INDEPENDENT_AMBULATORY_CARE_PROVIDER_SITE_OTHER): Payer: Medicare Other | Admitting: Obstetrics & Gynecology

## 2020-08-13 ENCOUNTER — Encounter: Payer: Self-pay | Admitting: Obstetrics & Gynecology

## 2020-08-13 ENCOUNTER — Other Ambulatory Visit: Payer: Self-pay

## 2020-08-13 VITALS — BP 127/65 | HR 86 | Temp 97.8°F | Ht 62.0 in | Wt 308.0 lb

## 2020-08-13 DIAGNOSIS — N950 Postmenopausal bleeding: Secondary | ICD-10-CM | POA: Diagnosis not present

## 2020-08-13 NOTE — Progress Notes (Signed)
History:  65 y.o.LMP here today for 2 week post op check.Pt is s/p hysteroscopy with polypectomy and colpo on 07/15/2020.  Pt reports that she is doing well. She is eating and passing stools without difficulty.     The following portions of the patient's history were reviewed and updated as appropriate: allergies, current medications, past family history, past medical history, past social history, past surgical history and problem list.  Review of Systems:  Pertinent items are noted in HPI.    Objective:  Physical Exam BP 127/65   Pulse 86   Temp 97.8 F (36.6 C)   Ht 5\' 2"  (1.575 m)   Wt (!) 308 lb (139.7 kg)   LMP 11/10/2011   BMI 56.33 kg/m   CONSTITUTIONAL: Well-developed, well-nourished female in no acute distress.  HENT:  Normocephalic, atraumatic EYES: Conjunctivae and EOM are normal. No scleral icterus.  NECK: Normal range of motion SKIN: Skin is warm and dry. No rash noted. Not diaphoretic.No pallor. [place]: Alert and oriented to person, place, and time. Normal coordination.  Abd: Soft, nontender and nondistended; her port sites are healing well. .  Pelvic: no blood in vault  Labs and Imaging Surg path 07/15/2020 A. ENDOCERVIX, CURETTAGE:  - Benign squamous and endocervical epithelium.   B. ENDOMETRIUM, CURETTAGE:  - Benign endometrial polyp.  - Benign squamous epithelium.    Assessment & Plan:  4 week post op check following polypectomy and colpo on 07/15/2020..   Doing well  Reviewed her surg path.   Reviewed post op instructions and activities  Gradual increase in activities  F/u in 3 months or sooner prn  All questions answered.   Total face-to-face time with patient, review of chart, discussion with consultant and coordination of care was 42min.  We discussed surg path and expected future sx.     Derion Kreiter L. Harraway-Smith, M.D., [place]. Harraway-Smith, M.D., [name]

## 2020-08-13 NOTE — Progress Notes (Signed)
Patient presents for four week post op from hysteroscopy D&C. Kathrene Alu RN

## 2020-08-18 ENCOUNTER — Telehealth: Payer: Self-pay

## 2020-08-18 ENCOUNTER — Emergency Department (HOSPITAL_BASED_OUTPATIENT_CLINIC_OR_DEPARTMENT_OTHER): Payer: Medicare Other

## 2020-08-18 ENCOUNTER — Emergency Department (HOSPITAL_BASED_OUTPATIENT_CLINIC_OR_DEPARTMENT_OTHER): Payer: Medicare Other | Admitting: Radiology

## 2020-08-18 ENCOUNTER — Ambulatory Visit (HOSPITAL_COMMUNITY)
Admission: EM | Admit: 2020-08-18 | Discharge: 2020-08-18 | Disposition: A | Discharge status: Home / Self Care | Payer: Medicare Other

## 2020-08-18 ENCOUNTER — Emergency Department (HOSPITAL_BASED_OUTPATIENT_CLINIC_OR_DEPARTMENT_OTHER)
Admission: EM | Admit: 2020-08-18 | Discharge: 2020-08-18 | Disposition: A | Discharge status: Home / Self Care | Payer: Medicare Other | Attending: Emergency Medicine | Admitting: Emergency Medicine

## 2020-08-18 ENCOUNTER — Encounter (HOSPITAL_BASED_OUTPATIENT_CLINIC_OR_DEPARTMENT_OTHER): Payer: Self-pay | Admitting: Obstetrics and Gynecology

## 2020-08-18 ENCOUNTER — Other Ambulatory Visit: Payer: Self-pay

## 2020-08-18 ENCOUNTER — Ambulatory Visit: Payer: Medicare Other | Admitting: Physician Assistant

## 2020-08-18 DIAGNOSIS — E119 Type 2 diabetes mellitus without complications: Secondary | ICD-10-CM | POA: Diagnosis not present

## 2020-08-18 DIAGNOSIS — Z96652 Presence of left artificial knee joint: Secondary | ICD-10-CM | POA: Insufficient documentation

## 2020-08-18 DIAGNOSIS — T148XXA Other injury of unspecified body region, initial encounter: Secondary | ICD-10-CM

## 2020-08-18 DIAGNOSIS — W19XXXA Unspecified fall, initial encounter: Secondary | ICD-10-CM

## 2020-08-18 DIAGNOSIS — W01198A Fall on same level from slipping, tripping and stumbling with subsequent striking against other object, initial encounter: Secondary | ICD-10-CM | POA: Diagnosis not present

## 2020-08-18 DIAGNOSIS — R519 Headache, unspecified: Secondary | ICD-10-CM | POA: Insufficient documentation

## 2020-08-18 DIAGNOSIS — S0990XA Unspecified injury of head, initial encounter: Secondary | ICD-10-CM | POA: Diagnosis not present

## 2020-08-18 DIAGNOSIS — Y92481 Parking lot as the place of occurrence of the external cause: Secondary | ICD-10-CM | POA: Insufficient documentation

## 2020-08-18 DIAGNOSIS — Z79899 Other long term (current) drug therapy: Secondary | ICD-10-CM | POA: Insufficient documentation

## 2020-08-18 DIAGNOSIS — S86911A Strain of unspecified muscle(s) and tendon(s) at lower leg level, right leg, initial encounter: Secondary | ICD-10-CM | POA: Insufficient documentation

## 2020-08-18 DIAGNOSIS — I10 Essential (primary) hypertension: Secondary | ICD-10-CM | POA: Insufficient documentation

## 2020-08-18 DIAGNOSIS — S8991XA Unspecified injury of right lower leg, initial encounter: Secondary | ICD-10-CM | POA: Diagnosis present

## 2020-08-18 DIAGNOSIS — M25561 Pain in right knee: Secondary | ICD-10-CM | POA: Diagnosis not present

## 2020-08-18 MED ORDER — ACETAMINOPHEN 325 MG PO TABS
650.0000 mg | ORAL_TABLET | Freq: Once | ORAL | Status: AC
  Administered 2020-08-18: 650 mg via ORAL
  Filled 2020-08-18: qty 2

## 2020-08-18 NOTE — Discharge Instructions (Signed)
Take Tylenol or Motrin for pain control.  Follow-up with your primary doctor.  Return to ER as needed.

## 2020-08-18 NOTE — Telephone Encounter (Signed)
Nurse Assessment Nurse: Clovis Riley RN, Georgina Peer Date/Time (Eastern Time): 08/18/2020 11:35:55 AM Confirm and document reason for call. If symptomatic, describe symptoms. ---Caller states she has tripped and fell on Saturday and has been having headaches since. States she was on the ground 70min. States she fell on her right leg and hip and her left arm and shoulder are painful. States she has a constant headache since the fall and is taking ibuprofen and it is not helping. Does the patient have any new or worsening symptoms? ---Yes Will a triage be completed? ---Yes Related visit to physician within the last 2 weeks? ---No Does the PT have any chronic conditions? (i.e. diabetes, asthma, this includes High risk factors for pregnancy, etc.) ---Yes List chronic conditions. ---bad knee, HTN. diabetes Is this a behavioral health or substance abuse call? ---No Guidelines Guideline Title Affirmed Question Affirmed Notes Nurse Date/Time (Eastern Time) Head Injury [1] No prior tetanus shots (or is not fully vaccinated) AND [2] any wound (e.g., cut, scrape) [place], RN, Georgina Peer 08/18/2020 11:41:32 AM PLEASE NOTE: All timestamps contained within this report are represented as [place] Standard Time. CONFIDENTIALTY NOTICE: This fax transmission is intended only for the addressee. It contains information that is legally privileged, confidential or otherwise protected from use or disclosure. If you are not the intended recipient, you are strictly prohibited from reviewing, disclosing, copying using or disseminating any of this information or taking any action in reliance on or regarding this information. If you have received this fax in error, please notify us immediately by telephone so that we can arrange for its return to [place]. Phone: [phone], Toll-Free: [phone], Fax: [phone] Page: 2 of 4 Call Id: [id number] [place]. Time Eilene Ghazi Time) Disposition Final User 08/18/2020 11:27:30 AM Attempt made  - message left Clovis Riley, RN, Georgina Peer 08/18/2020 11:44:11 AM See PCP within 24 Hours Yes Clovis Riley, RN, Leilani Merl Disagree/Comply Comply Caller Understands Yes PreDisposition Home Care Care Advice Given Per Guideline SEE PCP WITHIN 24 HOURS: * IF OFFICE WILL BE OPEN: You need to be examined within the next 24 hours. Call your doctor (or NP/PA) when the office opens and make an appointment. TETANUS SHOT SERIES: * If you have never gotten a tetanus shot, then you will need to get the full tetanus series. * The full tetanus shot series is a shot now, a shot in 4 to 8 weeks, and a shot in 6 to 12 months. Three shots in total. * You should try to get the first tetanus shot in the next 24 hours. PAIN MEDICINES - EXTRA NOTES AND WARNINGS: CALL BACK IF: * Dirt in the wound persists after scrubbing * Looks infected (pus, redness) * Doesn't heal within 10 days * You become worse CARE ADVICE given per Head Injury (Adult) guideline. Referrals REFERRED TO PCP OFFICE Triage Details User: Otelia Santee Date/Time: 08/18/2020 11:41:32 AM Triage Guideline: Falls and Falling Triage Guideline: Head Injury [1] ACUTE NEURO SYMPTOM AND [2] present now (DEFINITION: difficult to awaken OR confused thinking and talking OR slurred speech OR weakness of arms OR unsteady walking) -----No Knocked out (unconscious) > 1 minute -----No Seizure (convulsion) occurred (Exception: prior history of seizures and now alert and without Acute Neuro Symptoms) -----No Penetrating head injury (e.g., knife, gun shot wound, metal object) -----No [1] Major bleeding (e.g., actively dripping or spurting) AND [2] can't be stopped -----No PLEASE NOTE: All timestamps contained within this report are represented as [place] Standard Time. CONFIDENTIALTY NOTICE: This fax transmission is intended only for the addressee. It  contains information that is legally privileged, confidential or otherwise protected from use or disclosure. If you are  not the intended recipient, you are strictly prohibited from reviewing, disclosing, copying using or disseminating any of this information or taking any action in reliance on or regarding this information. If you have received this fax in error, please notify us immediately by telephone so that we can arrange for its return to [place]. Phone: [phone], Toll-Free: [phone], Fax: [phone] Page: 3 of 4 Call Id: [id number] Triage Details [1] Dangerous mechanism of injury (e.g., MVA, diving, trampoline, contact sports, fall > 10 feet or 3 meters) AND [2] NECK pain AND [3] began < 1 hour after injury -----No Sounds like a life-threatening emergency to the triager -----No [1] Diagnosed with concussion AND [2] within last 14 days -----No [1] Traumatic brain injury (mTBI; concussion) AND [2] more than 14 days since head injury -----No Can't remember what happened (amnesia) -----No Vomiting once or more -----No [1] Loss of vision or double vision AND [2] present now -----No Watery or blood-tinged fluid dripping from the NOSE or EARS now (Exception: tears from crying or nosebleed from nasal trauma) -----No [1] One or two "black eyes" (bruising, purple color of eyelids) AND [2] onset within 24 hours of head injury -----No Large swelling or bruise > 2 inches (5 cm) -----No Skin is split open or gaping (or length > 1/2 inch or 12 mm) -----No [1] Bleeding AND [2] won't stop after 10 minutes of direct pressure (using correct technique) -----No Sounds like a serious injury to the triager -----No [1] ACUTE NEURO SYMPTOM AND [2] now fine (DEFINITION: difficult to awaken OR confused thinking and talking OR slurred speech OR weakness of arms OR unsteady walking) -----No [1] Knocked out (unconscious) < 1 minute AND [2] now fine -----No [1] SEVERE headache AND [2] not improved 2 hours after pain medicine/ice packs -----No PLEASE NOTE: All timestamps contained within this report are  represented as [place] Standard Time. CONFIDENTIALTY NOTICE: This fax transmission is intended only for the addressee. It contains information that is legally privileged, confidential or otherwise protected from use or disclosure. If you are not the intended recipient, you are strictly prohibited from reviewing, disclosing, copying using or disseminating any of this information or taking any action in reliance on or regarding this information. If you have received this fax in error, please notify us immediately by telephone so that we can arrange for its return to [place]. Phone: [phone], Toll-Free: [phone], Fax: [phone] Page: 4 of 4 Call Id: [id number] Triage Details Dangerous injury (e.g., MVA, diving, trampoline, contact sports, fall > 10 feet or 3 meters) or severe blow from hard object (e.g., golf club or baseball bat) -----No Taking Coumadin (warfarin) or other strong blood thinner, or known bleeding disorder (e.g., thrombocytopenia) -----No Suspicious history for the injury -----No [1] Age over 25 years AND [2] swelling or bruise -----No Patient is confused or is an unreliable provider of information (e.g., dementia, severe intellectual disability, alcohol intoxication) -----No [1] No prior tetanus shots (or is not fully vaccinated) AND [2] any wound (e.g., cut, scrape) -----Yes Disposition: See PCP within 24 Hours SEE PCP WITHIN 24 HOURS: * IF OFFICE WILL BE OPEN: You need to be examined within the next 24 hours. Call your doctor (or NP/PA)  when the office opens and make an appointment. TETANUS SHOT SERIES: * If you have never gotten a tetanus shot, then you will need to get the full tetanus series. * The full tetanus shot  series is a shot now, a shot in 4 to 8 weeks, and a shot in 6 to 12 months. Three shots in  total. * You should try to get the first tetanus shot in the next 24 hours. PAIN MEDICINES - EXTRA NOTES AND WARNINGS: CALL BACK IF: * Dirt in the wound  persists after scrubbing * Looks infected (pus, redness) * Doesn't heal within 10 days * You become worse CARE ADVICE given per Head Injury (Adult) guideline   Pt currently in ED.

## 2020-08-18 NOTE — ED Triage Notes (Signed)
Pt presents today with c/o of fall that occurred on 08/16/20 outside of shopping center. She reports striking her head during fall and denies LOC. She does have current headache.

## 2020-08-18 NOTE — ED Provider Notes (Signed)
[facility] EMERGENCY DEPT Provider Note   CSN: [id number] Arrival date & time: 08/18/20  1705     History Chief Complaint  Patient presents with  . Fall    Leah Hampton is a 65 y.o. female.  Presenting to ER with concern for fall.  She reports that she fell on 4/2.  Fell while at the store.  Tripped.  Thinks that she hit her head, did not have loss of consciousness.  Also having some pain in her right knee and her bottom.  Has been able to walk without any significant difficulty.  Has had mild to moderate persistent headache ever since the fall.  HPI     Past Medical History:  Diagnosis Date  . Bronchitis   . Chronic bronchitis ([place])   . Cystitis   . Diabetes type 2, controlled ([place])   . Hematuria    urologice eval, Dr Estill Dooms  . History of chest pain   . Hyperbilirubinemia   . Hyperlipemia   . Hypertelorism   . Hypertension   . Leukocytosis   . Obesity   . Osteoarthritis    right knee  . Sleep apnea 2012   slight   . Vaginal Pap smear, abnormal   . Vitamin D deficiency 02/09/2018    Patient Active Problem List   Diagnosis Date Noted  . History of total left knee replacement 07/29/2020  . Endometrial polyp 07/15/2020  . Low grade squamous intraepithelial lesion (LGSIL) on cervical Pap smear 07/07/2020  . Upper abdominal pain 08/20/2019  . Right hip pain 05/28/2019  . Post-menopausal bleeding 05/16/2019  . Greater trochanteric pain syndrome of left lower extremity 03/20/2019  . Diabetes type 2, controlled ([place]) 05/18/2018  . Vitamin D deficiency 02/09/2018  . Onychomycosis 09/19/2017  . Tinea pedis of both feet 09/19/2017  . Obstructive chronic bronchitis with exacerbation ([place]) 05/23/2017  . Genital herpes 05/23/2012  . Stress incontinence 03/04/2012  . Osteoarthritis of right knee 10/12/2011  . CTS (carpal tunnel syndrome) 07/13/2011  . OSA (obstructive sleep apnea) 12/10/2010  . Menorrhagia 10/07/2010  . BACK PAIN, LUMBAR 08/01/2009  .  Obesity 07/31/2008  . Hyperlipidemia 07/03/2008  . Essential hypertension 07/03/2008  . HYPERBILIRUBINEMIA 07/03/2008    Past Surgical History:  Procedure Laterality Date  . CERVICAL POLYPECTOMY  07/15/2020   Procedure: POLYPECTOMY with Myosure;  Surgeon: Lavonia Drafts, MD;  Location: [facility];  Service: Gynecology;;  . COLONOSCOPY WITH PROPOFOL N/A 03/27/2015   Procedure: COLONOSCOPY WITH PROPOFOL;  Surgeon: Milus Banister, MD;  Location: [facility];  Service: Endoscopy;  Laterality: N/A;  . COLPOSCOPY N/A 07/15/2020   Procedure: COLPOSCOPY;  Surgeon: Lavonia Drafts, MD;  Location: [facility];  Service: Gynecology;  Laterality: N/A;  . DILATATION & CURETTAGE/HYSTEROSCOPY WITH MYOSURE N/A 01/05/2017   Procedure: DILATATION & CURETTAGE/HYSTEROSCOPY WITH MYOSURE;  Surgeon: Thurnell Lose, MD;  Location: [facility];  Service: Gynecology;  Laterality: N/A;  PostMenopausal Bleeding  . HYSTEROSCOPY WITH D & C N/A 05/22/2019   Procedure: DILATATION AND CURETTAGE /HYSTEROSCOPY, POLYPECTOMY WITH MYOSURE;  Surgeon: Lavonia Drafts, MD;  Location: [facility];  Service: Gynecology;  Laterality: N/A;  . HYSTEROSCOPY WITH D & C N/A 07/15/2020   Procedure: DILATATION AND CURETTAGE /HYSTEROSCOPY;  Surgeon: Lavonia Drafts, MD;  Location: [facility];  Service: Gynecology;  Laterality: N/A;  . KNEE ARTHROSCOPY  04/2007   left knee  . TOTAL KNEE ARTHROPLASTY Left 10/2008   Dr Roxine Caddy     OB History    Gravida  2  Para  2   Term  2   Preterm      AB      Living        SAB      IAB      Ectopic      Multiple      Live Births  2           Family History  Problem Relation Age of Onset  . Heart failure Mother   . Arthritis Mother   . Hypertension Mother   . Stroke Mother   . Diabetes Mellitus II Mother   . Heart attack Father 68  . Diabetes Other   . Arthritis Other   . Stroke Other   . Coronary artery disease Other   . Hypertension Sister        x 2  . CVA  Sister   . CAD Sister 70  . Diabetes Mellitus II Sister   . Cirrhosis Brother   . Alcohol abuse Brother   . Colon cancer Neg Hx     Social History   Tobacco Use  . Smoking status: Never Smoker  . Smokeless tobacco: Never Used  Vaping Use  . Vaping Use: Never used  Substance Use Topics  . Alcohol use: No  . Drug use: No    Home Medications Prior to Admission medications   Medication Sig Start Date End Date Taking? Authorizing Provider  albuterol (VENTOLIN HFA) 108 (90 Base) MCG/ACT inhaler USE 2 INHALATIONS BY MOUTH  EVERY 6 HOURS AS NEEDED FOR WHEEZING OR SHORTNESS OF  BREATH Patient taking differently: Inhale 2 puffs into the lungs every 6 (six) hours as needed for wheezing (Cough). 05/13/20  Yes Debbrah Alar, NP  blood glucose meter kit and supplies KIT Dispense based on patient and insurance preference. Use up to four times daily as directed. (FOR ICD-9 250.00, 250.01). 10/18/18  Yes Debbrah Alar, NP  diclofenac Sodium (VOLTAREN) 1 % GEL Apply 2 g topically daily as needed (Pain).   Yes [provider]  FLOVENT HFA 110 MCG/ACT inhaler USE 2 INHALATIONS BY MOUTH  TWICE DAILY 07/13/20  Yes Debbrah Alar, NP  ibuprofen (ADVIL) 600 MG tablet Take 1 tablet (600 mg total) by mouth every 6 (six) hours as needed. 07/15/20  Yes Lavonia Drafts, MD  losartan-hydrochlorothiazide (HYZAAR) 100-25 MG tablet TAKE 1 TABLET BY MOUTH  DAILY 07/13/20  Yes Debbrah Alar, NP  Multiple Vitamin (MULTIVITAMIN WITH MINERALS) TABS tablet Take 1 tablet by mouth daily. Vitafusion   Yes [provider]  Multiple Vitamins-Minerals (MULTIVITAMIN WITH MINERALS) tablet Take 1 tablet by mouth daily.   Yes [provider]  omeprazole (PRILOSEC) 20 MG capsule Take 1 capsule (20 mg total) by mouth daily. 03/14/20  Yes Debbrah Alar, NP  polyethylene glycol (MIRALAX / GLYCOLAX) 17 g packet Take 17 g by mouth daily as needed for moderate constipation or  severe constipation.   Yes [provider]  potassium chloride SA (KLOR-CON) 20 MEQ tablet TAKE 1 TABLET(20 MEQ) BY MOUTH DAILY Patient taking differently: Take 20 mEq by mouth daily. 03/14/20  Yes Debbrah Alar, NP  pravastatin (PRAVACHOL) 80 MG tablet Take 1 tablet (80 mg total) by mouth daily. 03/14/20  Yes Debbrah Alar, NP  psyllium (METAMUCIL) 58.6 % powder Take 1 packet by mouth daily as needed (constipation).   Yes [provider]  valACYclovir (VALTREX) 1000 MG tablet TAKE 1 TABLET BY MOUTH  DAILY 07/25/20  Yes Debbrah Alar, NP  Ciclopirox 0.77 %  gel Apply 1 application topically 2 (two) times daily. 11/26/19   Trula Slade, DPM  COVID-19 mRNA vaccine, Pfizer, 30 MCG/0.3ML injection INJECT AS DIRECTED 03/14/20 03/14/21  Carlyle Basques, MD    Allergies    Sulfamethoxazole-trimethoprim [bactrim] and Codeine  Review of Systems   Review of Systems  Constitutional: Negative for chills and fever.  HENT: Negative for ear pain and sore throat.        Head pain  Eyes: Negative for pain and visual disturbance.  Respiratory: Negative for cough and shortness of breath.   Cardiovascular: Negative for chest pain and palpitations.  Gastrointestinal: Negative for abdominal pain and vomiting.  Musculoskeletal: Positive for arthralgias and myalgias. Negative for back pain.  Skin: Negative for color change and rash.  Neurological: Positive for headaches. Negative for seizures and syncope.  Psychiatric/Behavioral: Negative for agitation and behavioral problems.  All other systems reviewed and are negative.   Physical Exam Updated Vital Signs BP 127/61 (BP Location: Right Arm)   Pulse 81   Temp 98.6 F (37 C) (Oral)   Resp 18   Ht 5' 2"  (1.575 m)   Wt (!) 139.7 kg   LMP 11/10/2011   SpO2 97%   BMI 56.33 kg/m   Physical Exam Vitals and nursing note reviewed.  Constitutional:      General: She is not in acute distress.    Appearance: She is  well-developed.  HENT:     Head: Normocephalic and atraumatic.     Comments: No trauma Eyes:     Conjunctiva/sclera: Conjunctivae normal.  Cardiovascular:     Rate and Rhythm: Normal rate and regular rhythm.     Heart sounds: No murmur heard.   Pulmonary:     Effort: Pulmonary effort is normal. No respiratory distress.     Breath sounds: Normal breath sounds.  Abdominal:     Palpations: Abdomen is soft.     Tenderness: There is no abdominal tenderness.  Musculoskeletal:     Cervical back: Neck supple.     Comments: No tenderness over C, T, L-spine There is some tenderness over the right knee, no real deformity was appreciated, normal joint range of motion throughout extremities  Skin:    General: Skin is warm and dry.  Neurological:     Mental Status: She is alert.     ED Results / Procedures / Treatments   Labs (all labs ordered are listed, but only abnormal results are displayed) Labs Reviewed - No data to display  EKG None  Radiology DG Pelvis 1-2 Views  Result Date: 08/18/2020 CLINICAL DATA:  65 year old female with fall and pelvic pain. EXAM: PELVIS - 1-2 VIEW COMPARISON:  CT abdomen pelvis dated 08/23/2019. FINDINGS: There is no acute fracture or dislocation. Mild bilateral hip arthritic changes. The soft tissues are unremarkable. IMPRESSION: No acute fracture or dislocation. Electronically Signed   By: Anner Crete M.D.   On: 08/18/2020 18:31   CT Head Wo Contrast  Result Date: 08/18/2020 CLINICAL DATA:  65 year old female with head trauma. EXAM: CT HEAD WITHOUT CONTRAST TECHNIQUE: Contiguous axial images were obtained from the base of the skull through the vertex without intravenous contrast. COMPARISON:  None. FINDINGS: Brain: The ventricles and sulci appropriate size for patient's age. The gray-white matter discrimination is preserved. There is no acute intracranial hemorrhage. No mass effect or midline shift. No extra-axial fluid collection. Vascular: No  hyperdense vessel or unexpected calcification. Skull: Normal. Negative for fracture or focal lesion. Sinuses/Orbits: No acute finding. Other:  None IMPRESSION: Unremarkable noncontrast CT of the brain. Electronically Signed   By: Anner Crete M.D.   On: 08/18/2020 18:11   DG Knee Complete 4 Views Right  Result Date: 08/18/2020 CLINICAL DATA:  65 year old female with history of right-sided knee pain after a fall. EXAM: RIGHT KNEE - COMPLETE 4+ VIEW COMPARISON:  Right knee radiograph 05/28/2019. FINDINGS: Four views of the right knee demonstrate no acute displaced fracture or dislocation. There is severe tricompartmental joint space narrowing, subchondral sclerosis, subchondral cyst formation and osteophyte formation, most pronounced in the medial and patellofemoral compartments. Mild lateral subluxation of the tibia relative to the femur, likely chronic. IMPRESSION: 1. No acute radiographic abnormality of the right knee. 2. Severe tricompartmental osteoarthritis of the right knee, as detailed above. Electronically Signed   By: Vinnie Langton M.D.   On: 08/18/2020 18:32    Procedures Procedures   Medications Ordered in ED Medications  acetaminophen (TYLENOL) tablet 650 mg (650 mg Oral Given 08/18/20 1759)    ED Course  I have reviewed the triage vital signs and the nursing notes.  Pertinent labs & imaging results that were available during my care of the patient were reviewed by me and considered in my medical decision making (see chart for details).    MDM Rules/Calculators/A&P                         65 year old lady presents to ER after fall, mechanical 2 days ago.  On exam she is well-appearing.  Noted some tenderness over her right knee.  CT head was negative, plain films negative for acute process.  Ambulatory, stable for DC.  After the discussed management above, the patient was determined to be safe for discharge.  The patient was in agreement with this plan and all questions  regarding their care were answered.  ED return precautions were discussed and the patient will return to the ED with any significant worsening of condition.    Final Clinical Impression(s) / ED Diagnoses Final diagnoses:  Fall, initial encounter  Muscle strain  Nonintractable headache, unspecified chronicity pattern, unspecified headache type    Rx / DC Orders ED Discharge Orders    None       Lucrezia Starch, MD 08/18/20 2222

## 2020-08-18 NOTE — ED Triage Notes (Signed)
Patient reports to the ER for a fall that happened on 4/2. Patient reports she thinks she hit her head but had no LOC. Patient reports left arm pain, right shoulder and right knee pain as well as hip and head pain. Patient reports she was at [facility] and the strip of metal was sticking up from the shopping cart parking area and she fell over it.

## 2020-08-20 ENCOUNTER — Encounter: Payer: Medicare Other | Attending: General Surgery | Admitting: Skilled Nursing Facility1

## 2020-08-20 ENCOUNTER — Telehealth (HOSPITAL_COMMUNITY): Payer: Self-pay | Admitting: *Deleted

## 2020-08-20 ENCOUNTER — Other Ambulatory Visit: Payer: Self-pay

## 2020-08-20 DIAGNOSIS — E669 Obesity, unspecified: Secondary | ICD-10-CM

## 2020-08-20 DIAGNOSIS — E119 Type 2 diabetes mellitus without complications: Secondary | ICD-10-CM

## 2020-08-20 NOTE — Telephone Encounter (Signed)
Patient given detailed instructions per Myocardial Perfusion Study Information Sheet for the test on 04/13 at 22. Patient notified to arrive 15 minutes early and that it is imperative to arrive on time for appointment to keep from having the test rescheduled.  If you need to cancel or reschedule your appointment, please call the office within 24 hours of your appointment. . Patient verbalized understanding.Leah Hampton, [name] No mychart available.

## 2020-08-20 NOTE — Progress Notes (Signed)
Supervised Weight Loss Visit Bariatric Nutrition Education  Planned Surgery: Sleeve  Pt Expectation of Surgery/ Goals: weight loss  6 out of 6 SWL Appointments    Pt completed visits.  Pt has cleared nutrition requirements.   NUTRITION ASSESSMENT  Anthropometrics  Start weight at [facility]: 312 lbs (date: 02/20/2020) Today's weight: 311.3 pounds BMI: 56.94 kg/m2    Lifestyle & Dietary Hx  Pt states she fell last week and arrives with a  Limp due to that experience. Pt states she has been drinking the sugar free options. Pt states she has stopped using bacon grease in her vegetables. Pt states she stopped eating junk food after dinner.   Pt states she feels at the end of this 6 months she was able to cut back on her portion of meats, eating more green vegetables, and drinking more water  Pt states she feels she may struggle with feelings of hunger after surgery.   Pt states she does not eat a whole lot already: this comment was unpacked and pt ended up understanding that may not be necessarily true accepting she feels she doe snot eat a lot compared to those on the television show but it is still over consumption fer her own body (Dietitian comment: great growth)    Estimated daily fluid intake: 48-64 oz Current average weekly physical activity: some walking Dignoses: diabetes (A1C 6.6), OSA  24-Hr Dietary Recall First Meal 11am: 2 chicken wraps: lettuce cheese chicken Snack: cashews Second Meal: fish sticks + tater tots or skipped or hot dogs and french fries or baked spaghetti + bread sticks or skipped Snack: nuts Third Meal 7-8pm: vegetable soup + [place] & ham sandwich or hamburger + beans or noodles + chicken + beans or steak + rice + green beans or chicken + rice + greens Snack:  Beverages: water, coffee + sugar creamer, hot tea w/ honey, some soda   NUTRITION DIAGNOSIS  Overweight/obesity ([place]-3.3) related to past poor dietary habits and physical inactivity as  evidenced by patient w/ planned Sleeve surgery following dietary guidelines for continued weight loss.   NUTRITION INTERVENTION  Nutrition counseling (C-1) and education (E-2) to facilitate bariatric surgery goals.  Pre-Op Goals Progress & New Goals . Working on eating at least 3 times per day  . Tracking food intake each day on paper  . Found appropriate protein shakes  . Meeting daily protein goal . Continue: to work on drinking at least 64 ounces of fluid daily  . Continue: including 3 things with lunch/dinner: veggies (1/2 of meal), carbs, and protein  . Continue: Practice chewing your food very thoroughly  . continue: try some different non starchy vegetables not cooking with grease and butter . Continue: try the sugar free creamer in your coffee: did not buy any yet . contonue: use the recipe website to find some healthy recipes . continue: try fat free plain greek yogurt instead of sour cream  Handouts Previously Provided Include   Detailed MyPlate  Learning Style & Readiness for Change Teaching method utilized: Visual & Auditory  Demonstrated degree of understanding via: Teach Back  Barriers to learning/adherence to lifestyle change: None Identified   MONITORING & EVALUATION Dietary intake, weekly physical activity, body weight, and pre-op goals  Next Steps  Patient is to return to [facility] for pre-op class Pt has completed visits. No further supervised visits required/recomended

## 2020-08-26 ENCOUNTER — Ambulatory Visit: Payer: Medicare Other | Admitting: Family Medicine

## 2020-08-26 DIAGNOSIS — K911 Postgastric surgery syndromes: Secondary | ICD-10-CM | POA: Diagnosis not present

## 2020-08-26 DIAGNOSIS — R7303 Prediabetes: Secondary | ICD-10-CM | POA: Diagnosis not present

## 2020-08-26 DIAGNOSIS — Z1321 Encounter for screening for nutritional disorder: Secondary | ICD-10-CM | POA: Diagnosis not present

## 2020-08-27 ENCOUNTER — Ambulatory Visit
Admission: RE | Admit: 2020-08-27 | Discharge: 2020-08-27 | Disposition: A | Discharge status: Home / Self Care | Payer: Self-pay | Source: Ambulatory Visit | Attending: Cardiology | Admitting: Cardiology

## 2020-08-27 ENCOUNTER — Ambulatory Visit (HOSPITAL_COMMUNITY): Payer: Medicare Other | Attending: Internal Medicine

## 2020-08-27 ENCOUNTER — Other Ambulatory Visit: Payer: Self-pay

## 2020-08-27 DIAGNOSIS — R079 Chest pain, unspecified: Secondary | ICD-10-CM

## 2020-08-27 DIAGNOSIS — E785 Hyperlipidemia, unspecified: Secondary | ICD-10-CM

## 2020-08-27 MED ORDER — TECHNETIUM TC 99M TETROFOSMIN IV KIT
32.5000 | PACK | Freq: Once | INTRAVENOUS | Status: AC | PRN
  Administered 2020-08-27: 32.5 via INTRAVENOUS
  Filled 2020-08-27: qty 33

## 2020-08-27 MED ORDER — REGADENOSON 0.4 MG/5ML IV SOLN
0.4000 mg | Freq: Once | INTRAVENOUS | Status: AC
  Administered 2020-08-27: 0.4 mg via INTRAVENOUS

## 2020-08-27 MED ORDER — AMINOPHYLLINE 25 MG/ML IV SOLN
75.0000 mg | Freq: Once | INTRAVENOUS | Status: AC
  Administered 2020-08-27: 75 mg via INTRAVENOUS

## 2020-08-28 ENCOUNTER — Ambulatory Visit (HOSPITAL_COMMUNITY): Payer: Medicare Other | Attending: Internal Medicine

## 2020-08-28 LAB — MYOCARDIAL PERFUSION IMAGING
LV dias vol: 59 mL (ref 46–106)
LV sys vol: 21 mL
Peak HR: 113 {beats}/min
Rest HR: 89 {beats}/min
SDS: 1
SRS: 0
SSS: 1
TID: 1

## 2020-08-28 MED ORDER — TECHNETIUM TC 99M TETROFOSMIN IV KIT
30.4000 | PACK | Freq: Once | INTRAVENOUS | Status: AC | PRN
  Administered 2020-08-28: 30.4 via INTRAVENOUS
  Filled 2020-08-28: qty 31

## 2020-09-01 ENCOUNTER — Telehealth: Payer: Self-pay | Admitting: Cardiology

## 2020-09-01 NOTE — Telephone Encounter (Signed)
Patient would like to go over her stress test results.

## 2020-09-01 NOTE — Telephone Encounter (Signed)
Pt updated with stress test results and verbalized understanding.   Donato Heinz, MD  08/31/2020 2:02 PM EDT      Normal stress test

## 2020-09-05 ENCOUNTER — Other Ambulatory Visit: Payer: Self-pay | Admitting: Family

## 2020-09-05 DIAGNOSIS — I10 Essential (primary) hypertension: Secondary | ICD-10-CM

## 2020-09-15 ENCOUNTER — Encounter: Payer: Medicare Other | Attending: General Surgery | Admitting: Skilled Nursing Facility1

## 2020-09-15 ENCOUNTER — Other Ambulatory Visit: Payer: Self-pay

## 2020-09-15 DIAGNOSIS — E669 Obesity, unspecified: Secondary | ICD-10-CM | POA: Insufficient documentation

## 2020-09-15 DIAGNOSIS — E119 Type 2 diabetes mellitus without complications: Secondary | ICD-10-CM | POA: Insufficient documentation

## 2020-09-15 NOTE — Progress Notes (Signed)
Pre-Operative Nutrition Class:    Patient was seen on 09/15/2020 for Pre-Operative Bariatric Surgery Education at [facility]    Surgery date: Surgery type: sleeve Start weight at [facility]: 312 Weight today: 311.3  Samples given per MNT protocol. Patient educated on appropriate usage:  ProcareMultivitamin Lot [id number] Exp:05/22   procareVitamins Calcium  Lot #[id number] Exp:08/22  Protein 20Powder/Shake Lot #[id number] Exp:04/23  The following the learning objectives were met by the patient during this course:  Identify Pre-Op Dietary Goals and will begin 2 weeks pre-operatively  Identify appropriate sources of fluids and proteins   State protein recommendations and appropriate sources pre and post-operatively  Identify Post-Operative Dietary Goals and will follow for 2 weeks post-operatively  Identify appropriate multivitamin and calcium sources  Describe the need for physical activity post-operatively and will follow MD recommendations  State when to call healthcare provider regarding medication questions or post-operative complications  When having a diagnosis of diabetes understanding hypoglycemia symptoms and the inclusion of 1 complex carbohydrate per meal  Handouts given during class include:  Pre-Op Bariatric Surgery Diet Handout  Protein Shake Handout  Post-Op Bariatric Surgery Nutrition Handout  [facility] Program Information Flyer  Support Group Information Flyer  [facility] Bariatric Supplements Price List  Follow-Up Plan: Patient will follow-up at [facility] 2 weeks post operatively for diet advancement per MD.

## 2020-09-22 ENCOUNTER — Telehealth: Payer: Self-pay | Admitting: Skilled Nursing Facility1

## 2020-09-22 NOTE — Telephone Encounter (Signed)
Returned pts call.   Dietitian answered pts questions to her satisfaction.

## 2020-09-25 ENCOUNTER — Ambulatory Visit (INDEPENDENT_AMBULATORY_CARE_PROVIDER_SITE_OTHER): Payer: Medicare Other | Admitting: Podiatry

## 2020-09-25 ENCOUNTER — Other Ambulatory Visit: Payer: Self-pay

## 2020-09-25 DIAGNOSIS — L299 Pruritus, unspecified: Secondary | ICD-10-CM

## 2020-09-25 DIAGNOSIS — L989 Disorder of the skin and subcutaneous tissue, unspecified: Secondary | ICD-10-CM

## 2020-09-25 MED ORDER — TRIAMCINOLONE ACETONIDE 0.1 % EX CREA: 1.0000 "application " | TOPICAL_CREAM | Freq: Two times a day (BID) | CUTANEOUS | 0 refills | Status: DC

## 2020-09-29 ENCOUNTER — Other Ambulatory Visit (HOSPITAL_COMMUNITY)
Admission: RE | Admit: 2020-09-29 | Discharge: 2020-09-29 | Disposition: A | Discharge status: Home / Self Care | Payer: Medicare Other | Source: Ambulatory Visit | Attending: Gastroenterology | Admitting: Gastroenterology

## 2020-09-29 ENCOUNTER — Other Ambulatory Visit: Payer: Self-pay

## 2020-09-29 ENCOUNTER — Encounter (HOSPITAL_COMMUNITY): Payer: Self-pay | Admitting: Gastroenterology

## 2020-09-29 DIAGNOSIS — Z20822 Contact with and (suspected) exposure to covid-19: Secondary | ICD-10-CM | POA: Insufficient documentation

## 2020-09-29 DIAGNOSIS — Z01812 Encounter for preprocedural laboratory examination: Secondary | ICD-10-CM | POA: Diagnosis not present

## 2020-09-29 LAB — SARS CORONAVIRUS 2 (TAT 6-24 HRS): SARS Coronavirus 2: NEGATIVE

## 2020-09-30 ENCOUNTER — Ambulatory Visit (INDEPENDENT_AMBULATORY_CARE_PROVIDER_SITE_OTHER): Payer: Medicare Other | Admitting: Family Medicine

## 2020-09-30 DIAGNOSIS — M160 Bilateral primary osteoarthritis of hip: Secondary | ICD-10-CM

## 2020-09-30 DIAGNOSIS — M1711 Unilateral primary osteoarthritis, right knee: Secondary | ICD-10-CM

## 2020-09-30 MED ORDER — PENNSAID 2 % EX SOLN: 1.0000 "application " | Freq: Two times a day (BID) | CUTANEOUS | 2 refills | Status: DC

## 2020-09-30 NOTE — Assessment & Plan Note (Signed)
Acute on chronic in nature.  Has gotten improvement with recent steroid injection but pain is returning. -Counseled on home exercise therapy and supportive care. -Pennsaid. -Pursue gel injection

## 2020-09-30 NOTE — Progress Notes (Signed)
Subjective: 65 year old female presents the office today with concerns of skin rash to bilateral feet.  She reports she had treatment for tinea pedis without any significant provement.  Continues any issues.  For cyst on the left foot that she states is also on the right foot now.  Denies any open lesions or pustules. Denies any systemic complaints such as fevers, chills, nausea, vomiting. No acute changes since last appointment, and no other complaints at this time.   Objective: AAO x3, NAD DP/PT pulses palpable bilaterally, CRT less than 3 seconds Dry, scaly, flaky, erythematous skin present most of the lateral aspect left foot as well as the medial aspect of the right foot.  No open lesions there is no pustules present.  There is no ascending cellulitis.  No fluctuation crepitation.  No pain with calf compression, swelling, warmth, erythema  Assessment: Skin rash bilaterally  Plan: -All treatment options discussed with the patient including all alternatives, risks, complications.  -Prescribed triamcinolone cream.  If no improvement will likely biopsy -Patient encouraged to call the office with any questions, concerns, change in symptoms.   Trula Slade DPM

## 2020-09-30 NOTE — Assessment & Plan Note (Signed)
Has degenerative changes appreciated of each hip.  Had a fall recently and has ongoing left hip pain. -Counseled on home exercise therapy and supportive care. -X-ray.

## 2020-09-30 NOTE — Progress Notes (Signed)
Virtual Visit via Telephone Note  I connected with Leah Hampton on 09/30/20 at 10:30 AM EDT by telephone and verified that I am speaking with the correct person using two identifiers.  Location: Patient: home Provider: office   I discussed the limitations, risks, security and privacy concerns of performing an evaluation and management service by telephone and the availability of in person appointments. I also discussed with the patient that there may be a patient responsible charge related to this service. The patient expressed understanding and agreed to proceed.   History of Present Illness:   Ms. Leah Hampton is a 65 year old female that has following up for her right knee pain.  She is also presenting with left hip pain after a fall a few weeks ago.  She is having her gastric bypass surgery at the end of this month.  Her right knee is still bothering her.  She did get some improvement with the steroid injection.  Observations/Objective:   Assessment and Plan:  Osteoarthritis of right knee: Acute on chronic in nature.  Has gotten improvement with recent steroid injection but pain is returning. -Counseled on home exercise therapy and supportive care. -Pennsaid. -Pursue gel injection   Osteoarthritis of both hips: Has degenerative changes appreciated of each hip.  Had a fall recently and has ongoing left hip pain. -Counseled on home exercise therapy and supportive care. -X-ray.  Follow Up Instructions:    I discussed the assessment and treatment plan with the patient. The patient was provided an opportunity to ask questions and all were answered. The patient agreed with the plan and demonstrated an understanding of the instructions.   The patient was advised to call back or seek an in-person evaluation if the symptoms worsen or if the condition fails to improve as anticipated.  I provided 6 minutes of non-face-to-face time during this encounter.   Clearance Coots, MD

## 2020-10-01 ENCOUNTER — Ambulatory Visit: Payer: Self-pay | Admitting: General Surgery

## 2020-10-01 DIAGNOSIS — I10 Essential (primary) hypertension: Secondary | ICD-10-CM | POA: Diagnosis not present

## 2020-10-01 DIAGNOSIS — K436 Other and unspecified ventral hernia with obstruction, without gangrene: Secondary | ICD-10-CM | POA: Diagnosis not present

## 2020-10-01 DIAGNOSIS — E7800 Pure hypercholesterolemia, unspecified: Secondary | ICD-10-CM | POA: Diagnosis not present

## 2020-10-01 DIAGNOSIS — R7303 Prediabetes: Secondary | ICD-10-CM | POA: Diagnosis not present

## 2020-10-01 DIAGNOSIS — Z86010 Personal history of colonic polyps: Secondary | ICD-10-CM | POA: Diagnosis not present

## 2020-10-01 NOTE — H&P (Signed)
Leah Hampton Appointment: 10/01/2020 11:15 AM Location: [facility] Patient #: [id number] DOB: [birth year] Single / Language: Cleophus Molt / Race: Black or African American Female  History of Present Illness Randall Hiss M. Monts MD; 10/01/2020 11:25 AM) The patient is a 65 year old female who presents for a bariatric surgery evaluation. The patient comes in for long-term follow-up regarding her obesity and comorbidities. She has completed bariatric surgery pathway. She is receiving nutritional, psychological cardiac clearance. She had an echocardiogram as well as a Myoview scan. She had a low-risk Myoview scan. Her coronary calcium score was 0. She did have a fall since I last saw her. She has chronic right knee issues. She uses a cane. She denies any chest pain, chest pressure, shortness of breath, dyspnea exertion. No tobacco use. No TIAs or amaurosis fugax. She hasn't really had any GERD issues lately. Her upper GI showed no hiatal hernia, mild nonspecific esophageal dysmotility. She has a chronically incarcerated epigastric fat-containing hernia. Labs were unremarkable. LDL was 107, hemoglobin A1c was 6.7. She is scheduled for a recall colonoscopy tomorrow with Dr. Ardis Hughs.  01/2020 The patient comes in today to discuss weight loss surgery. I initially met her a few months ago when she was referred for an incarcerated fat-containing ventral hernia. Given her BMI we discussed that she would be at high risk for recurrent so we discussed weight loss surgery. She has completed both and in person as well as an Neurosurgeon. She is interested in sleeve gastrectomy.  Despite numerous attempts for sustained weight loss she's been unsuccessful. She has tried [employer], working with the bariatric clinic in the past, and most recently with blue Billie Ruddy.D. without any long-term results  Her comorbidities include hypertension, dyslipidemia, right knee osteoarthritis, prediabetes,  GERD  She denies any chest pain, chest pressure, shortness of breath, paroxysmal nocturnal dyspnea but does endorse some dyspnea on exertion. No TIAs or amaurosis fugax. No prior blood clots. Takes medicine for cholesterol. No significant peripheral edema. Sleep apnea questionnaire was low risk. She states that she had a negative sleep study a few years ago or perhaps that showed mild obstructive sleep apnea-she can't remember  She takes omeprazole for heartburn. She has heartburn with certain foods. She denies any difficulty swallowing liquids or solids. No prior surgery. Daily bowel movement. No melena or hematochezia. Has an incarcerated fat-containing ventral hernia. She had a colonoscopy in 2016 due to a history of precancerous polyps and it was negative. She is due for repeat colonoscopy this fall. She had a mammogram in the summer which was normal. She has chronic right knee pain and uses a walker. She denies any dysuria or hematuria. She has a diagnosis prediabetes. No TIAs or amaurosis fugax. No migraines. No tobacco, drugs or alcohol. She lives by herself. She has a sister and a son in the immediate area.   Problem List/Past Medical Randall Hiss M. 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 G  This patient encounter took 28 minutes today to perform the following: take history, perform exam, review outside records, interpret imaging, counsel the patient on their diagnosis and document encounter, findings & plan in the [place] (Z86.010)  Past Surgical History Randall Hiss M. Redmond Pulling, MD; 10/01/2020 11:28 AM) Knee Surgery Left. Colon Polyp Removal - Open  Diagnostic Studies History Randall Hiss M. Redmond Pulling, MD; 10/01/2020  11:28 AM) Colonoscopy 1-5 years ago Mammogram 1-3 years  ago within last year Pap Smear 1-5 years ago  Allergies Janeann Forehand, CNA; 10/01/2020 10:47 AM) CODEINE Nausea, Vomiting. Sulfamethoxazole-Trimethoprim *Anti-infective Agents - Misc.** Itching. Allergies Reconciled  Medication History Janeann Forehand, CNA; 10/01/2020 10:47 AM) Omeprazole (20MG Capsule DR, Oral) Active. Ventolin HFA (108 (90 Base)MCG/ACT Aerosol Soln, Inhalation) Active. Clotrimazole-Betamethasone (1-0.05% Cream, External) Active. Dicyclomine HCl (10MG Capsule, Oral) Active. Famotidine (20MG Tablet, Oral) Active. Flovent HFA (110MCG/ACT Aerosol, Inhalation) Active. HYDROcodone-Acetaminophen (5-325MG Tablet, Oral) Active. Ibuprofen (600MG Tablet, Oral) Active. Losartan Potassium-HCTZ (100-25MG Tablet, Oral) Active. Meloxicam (7.5MG Tablet, Oral) Active. Potassium Chloride Crys ER (20MEQ Tablet ER, Oral) Active. valACYclovir HCl (1GM Tablet, Oral) Active. Pravastatin Sodium (80MG Tablet, Oral) Active. Metamucil (58.6% Powder, Oral) Active. Multivitamin Adult (Minerals) (Oral) Active. Medications Reconciled  Social History Randall Hiss M. Redmond Pulling, MD; 10/01/2020 11:28 AM) Caffeine use Coffee. No alcohol use No drug use Tobacco use Never smoker.  Family History Randall Hiss M. Redmond Pulling, MD; 10/01/2020 11:28 AM) Alcohol Abuse Brother, Mother, Sister. Arthritis Mother. Cerebrovascular Accident Mother, Sister. Diabetes Mellitus Family Members In General, Mother, Sister. Heart Disease Father, Mother. Heart disease in female family member before age 40 Hypertension Mother, Sister. Respiratory Condition Mother.  Pregnancy / Birth History Randall Hiss M. Redmond Pulling, MD; 10/01/2020 11:28 AM) Age at menarche 66 years. Age of menopause 51-55 Contraceptive History Oral contraceptives. Gravida 2 Maternal age <74 Para 2  Other Problems Randall Hiss M. 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 Mast MD; 10/01/2020 11:26 AM) All other systems negative  Vitals (Donyelle Alston CNA; 10/01/2020 10:47 AM) 10/01/2020 10:47 AM Weight: 304.38 lb Height: 62in Body Surface Area: 2.29 m Body Mass Index: 55.67 kg/m  Temp.: 97.10F  Pulse: 124 (Regular)  P.OX: 98% (Room air) BP: 140/96(Sitting, Left Arm, Standard)        Physical Exam Randall Hiss M. Musleh MD; 10/01/2020 11:26 AM)  The physical exam findings are as follows: Note:severe obesity; mainly central  General Mental Status-Alert. General Appearance-Consistent with stated age. Hydration-Well hydrated. Voice-Normal. Note: Limps, uses a cane  Head and Neck Head-normocephalic, atraumatic with no lesions or palpable masses. Trachea-midline. Thyroid Gland Characteristics - normal size and consistency.  Eye Eyeball - Bilateral-Extraocular movements intact. Sclera/Conjunctiva - Bilateral-No scleral icterus.  Chest and Lung Exam Chest and lung exam reveals -quiet, even and easy respiratory effort with no use of accessory muscles and on auscultation, normal breath sounds, no adventitious sounds and normal vocal resonance. Inspection Chest Wall - Normal. Back - normal.  Breast - Did not examine.  Cardiovascular Cardiovascular examination reveals -normal heart sounds, regular rate and rhythm with no murmurs and normal pedal pulses bilaterally.  Abdomen Inspection  Inspection of the abdomen reveals: Note: bulge in upper midline about 2 in above umbilicus. firm bulge. non reducible. not really tender. Skin - Scar - no surgical scars. Palpation/Percussion Palpation and Percussion of the abdomen reveal - Soft, Non Tender, No Rebound tenderness, No Rigidity (guarding) and No hepatosplenomegaly. Auscultation Auscultation of the abdomen  reveals - Bowel sounds normal.  Peripheral Vascular Upper Extremity Palpation - Pulses bilaterally normal.  Neurologic Neurologic evaluation reveals -alert and oriented x 3 with no impairment of recent or remote memory. Mental Status-Normal.  Neuropsychiatric The patient's mood and affect are described as -normal. Judgment and Insight-insight is appropriate concerning matters relevant to self.  Musculoskeletal Normal Exam - Left-Upper  Extremity Strength Normal and Lower Extremity Strength Normal. Normal Exam - Right-Upper Extremity Strength Normal and Lower Extremity Strength Normal.  Lymphatic Head & Neck  General Head & Neck Lymphatics: Bilateral - Description - Normal. Axillary - Did not examine. Femoral & Inguinal - Did not examine.    Assessment & Plan Randall Hiss M. Span MD; 10/01/2020 11:28 AM)  SEVERE OBESITY (E66.01) Story: I think the patient is a candidate for weight loss surgery. G  This patient encounter took 28 minutes today to perform the following: take history, perform exam, review outside records, interpret imaging, counsel the patient on their diagnosis and document encounter, findings & plan in the EHR Impression: The patient meets weight loss surgery criteria. I think the patient would be an acceptable candidate for Laparoscopic vertical sleeve gastrectomy.  We rediscussed LSG. We reviewed her workup. We discussed her imaging results, cardiology evaluation, nutritional and psychological assessment. She has attended her preoperative education class. We discussed the typical hospitalization the typical recovery. We discussed the typical issues that we see perioperatively. We discussed the importance of the preoperative meal plan  Current Plans Pt Education - EMW_preopbariatric  HISTORY OF COLON POLYPS (Z86.010) Impression: Due for colonoscopy with Dr. Ardis Hughs ; scheduled for this for tomorrow. 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  The plan would be to work around her fat-containing ventral hernia during her sleeve gastrectomy, lose weight, and then go back 6-9 months later for hernia repair to minimize chance of recurrent  We did discuss that we may have to deal with her fat-containing upper midline ventral hernia during surgery. I think this unlikely. It is so if she has a large defect we would leave it alone. It had small defect we would have to do a primary repair which revealed high risk for recurrence.   PREDIABETES (R73.03)   PRIMARY HYPERTENSION (I10) Impression: Evaluated and cleared by cardiology   HYPERCHOLESTEREMIA (E78.00)  Leighton Ruff. Redmond Pulling, MD, FACS General, Bariatric, & Minimally Invasive Surgery [facility], [place]

## 2020-10-01 NOTE — Anesthesia Preprocedure Evaluation (Addendum)
Anesthesia Evaluation  Patient identified by MRN, date of birth, ID band Patient awake    Reviewed: Allergy & Precautions, NPO status , Patient's Chart, lab work & pertinent test results  Airway Mallampati: II  TM Distance: >3 FB Neck ROM: Full    Dental no notable dental hx. (+) Dental Advisory Given   Pulmonary sleep apnea , COPD,  COPD inhaler,    Pulmonary exam normal breath sounds clear to auscultation       Cardiovascular hypertension, Pt. on medications Normal cardiovascular exam Rhythm:Regular Rate:Normal  09/05/20 Lexiscan  Impression Myocardial perfusion is normal.   The study is normal.   This is a low risk study.  Overall left ventricular systolic function was normal.    LV cavity size is normal.  Nuclear stress EF:  64%.  The left ventricular ejection fraction is normal (55-65%). There is no prior study for comparison.    Neuro/Psych    GI/Hepatic GERD  ,  Endo/Other  diabetes, Type 2Morbid obesity  Renal/GU      Musculoskeletal   Abdominal (+) + obese,   Peds  Hematology   Anesthesia Other Findings ALL codeine Bactrim  Reproductive/Obstetrics                            Anesthesia Physical Anesthesia Plan  ASA: III  Anesthesia Plan: MAC   Post-op Pain Management:    Induction:   PONV Risk Score and Plan: Treatment may vary due to age or medical condition  Airway Management Planned: Natural Airway  Additional Equipment: None  Intra-op Plan:   Post-operative Plan:   Informed Consent:     Dental advisory given  Plan Discussed with:   Anesthesia Plan Comments: (Colonoscopy for history of colon polyps)        Anesthesia Quick Evaluation

## 2020-10-01 NOTE — H&P (View-Only) (Signed)
[name] Appointment: 10/01/2020 11:15 AM Location: [facility] Patient #: [id number] DOB: [birth year] Single / Language: Cleophus Molt / Race: Black or African American Female  History of Present Illness Randall Hiss M. Defranco MD; 10/01/2020 11:25 AM) The patient is a 65 year old female who presents for a bariatric surgery evaluation. The patient comes in for long-term follow-up regarding her obesity and comorbidities. She has completed bariatric surgery pathway. She is receiving nutritional, psychological cardiac clearance. She had an echocardiogram as well as a Myoview scan. She had a low-risk Myoview scan. Her coronary calcium score was 0. She did have a fall since I last saw her. She has chronic right knee issues. She uses a cane. She denies any chest pain, chest pressure, shortness of breath, dyspnea exertion. No tobacco use. No TIAs or amaurosis fugax. She hasn't really had any GERD issues lately. Her upper GI showed no hiatal hernia, mild nonspecific esophageal dysmotility. She has a chronically incarcerated epigastric fat-containing hernia. Labs were unremarkable. LDL was 107, hemoglobin A1c was 6.7. She is scheduled for a recall colonoscopy tomorrow with Dr. Ardis Hughs.  01/2020 The patient comes in today to discuss weight loss surgery. I initially met her a few months ago when she was referred for an incarcerated fat-containing ventral hernia. Given her BMI we discussed that she would be at high risk for recurrent so we discussed weight loss surgery. She has completed both and in person as well as an Neurosurgeon. She is interested in sleeve gastrectomy.  Despite numerous attempts for sustained weight loss she's been unsuccessful. She has tried [employer], working with the bariatric clinic in the past, and most recently with blue Billie Ruddy.D. without any long-term results  Her comorbidities include hypertension, dyslipidemia, right knee osteoarthritis, prediabetes,  GERD  She denies any chest pain, chest pressure, shortness of breath, paroxysmal nocturnal dyspnea but does endorse some dyspnea on exertion. No TIAs or amaurosis fugax. No prior blood clots. Takes medicine for cholesterol. No significant peripheral edema. Sleep apnea questionnaire was low risk. She states that she had a negative sleep study a few years ago or perhaps that showed mild obstructive sleep apnea-she can't remember  She takes omeprazole for heartburn. She has heartburn with certain foods. She denies any difficulty swallowing liquids or solids. No prior surgery. Daily bowel movement. No melena or hematochezia. Has an incarcerated fat-containing ventral hernia. She had a colonoscopy in 2016 due to a history of precancerous polyps and it was negative. She is due for repeat colonoscopy this fall. She had a mammogram in the summer which was normal. She has chronic right knee pain and uses a walker. She denies any dysuria or hematuria. She has a diagnosis prediabetes. No TIAs or amaurosis fugax. No migraines. No tobacco, drugs or alcohol. She lives by herself. She has a sister and a son in the immediate area.   Problem List/Past Medical Randall Hiss M. 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 G  This patient encounter took 28 minutes today to perform the following: take history, perform exam, review outside records, interpret imaging, counsel the patient on their diagnosis and document encounter, findings & plan in the [place] (Z86.010)  Past Surgical History Randall Hiss M. Redmond Pulling, MD; 10/01/2020 11:28 AM) Knee Surgery Left. Colon Polyp Removal - Open  Diagnostic Studies History Randall Hiss M. Redmond Pulling, MD; 10/01/2020  11:28 AM) Colonoscopy 1-5 years ago Mammogram 1-3 years  ago within last year Pap Smear 1-5 years ago  Allergies Janeann Forehand, CNA; 10/01/2020 10:47 AM) CODEINE Nausea, Vomiting. Sulfamethoxazole-Trimethoprim *Anti-infective Agents - Misc.** Itching. Allergies Reconciled  Medication History Janeann Forehand, CNA; 10/01/2020 10:47 AM) Omeprazole (20MG Capsule DR, Oral) Active. Ventolin HFA (108 (90 Base)MCG/ACT Aerosol Soln, Inhalation) Active. Clotrimazole-Betamethasone (1-0.05% Cream, External) Active. Dicyclomine HCl (10MG Capsule, Oral) Active. Famotidine (20MG Tablet, Oral) Active. Flovent HFA (110MCG/ACT Aerosol, Inhalation) Active. HYDROcodone-Acetaminophen (5-325MG Tablet, Oral) Active. Ibuprofen (600MG Tablet, Oral) Active. Losartan Potassium-HCTZ (100-25MG Tablet, Oral) Active. Meloxicam (7.5MG Tablet, Oral) Active. Potassium Chloride Crys ER (20MEQ Tablet ER, Oral) Active. valACYclovir HCl (1GM Tablet, Oral) Active. Pravastatin Sodium (80MG Tablet, Oral) Active. Metamucil (58.6% Powder, Oral) Active. Multivitamin Adult (Minerals) (Oral) Active. Medications Reconciled  Social History Randall Hiss M. Redmond Pulling, MD; 10/01/2020 11:28 AM) Caffeine use Coffee. No alcohol use No drug use Tobacco use Never smoker.  Family History Randall Hiss M. Redmond Pulling, MD; 10/01/2020 11:28 AM) Alcohol Abuse Brother, Mother, Sister. Arthritis Mother. Cerebrovascular Accident Mother, Sister. Diabetes Mellitus Family Members In General, Mother, Sister. Heart Disease Father, Mother. Heart disease in female family member before age 66 Hypertension Mother, Sister. Respiratory Condition Mother.  Pregnancy / Birth History Randall Hiss M. Redmond Pulling, MD; 10/01/2020 11:28 AM) Age at menarche 57 years. Age of menopause 51-55 Contraceptive History Oral contraceptives. Gravida 2 Maternal age <74 Para 2  Other Problems Randall Hiss M. 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 Schexnayder MD; 10/01/2020 11:26 AM) All other systems negative  Vitals (Donyelle Alston CNA; 10/01/2020 10:47 AM) 10/01/2020 10:47 AM Weight: 304.38 lb Height: 62in Body Surface Area: 2.29 m Body Mass Index: 55.67 kg/m  Temp.: 97.70F  Pulse: 124 (Regular)  P.OX: 98% (Room air) BP: 140/96(Sitting, Left Arm, Standard)        Physical Exam Randall Hiss M. Robel MD; 10/01/2020 11:26 AM)  The physical exam findings are as follows: Note:severe obesity; mainly central  General Mental Status-Alert. General Appearance-Consistent with stated age. Hydration-Well hydrated. Voice-Normal. Note: Limps, uses a cane  Head and Neck Head-normocephalic, atraumatic with no lesions or palpable masses. Trachea-midline. Thyroid Gland Characteristics - normal size and consistency.  Eye Eyeball - Bilateral-Extraocular movements intact. Sclera/Conjunctiva - Bilateral-No scleral icterus.  Chest and Lung Exam Chest and lung exam reveals -quiet, even and easy respiratory effort with no use of accessory muscles and on auscultation, normal breath sounds, no adventitious sounds and normal vocal resonance. Inspection Chest Wall - Normal. Back - normal.  Breast - Did not examine.  Cardiovascular Cardiovascular examination reveals -normal heart sounds, regular rate and rhythm with no murmurs and normal pedal pulses bilaterally.  Abdomen Inspection  Inspection of the abdomen reveals: Note: bulge in upper midline about 2 in above umbilicus. firm bulge. non reducible. not really tender. Skin - Scar - no surgical scars. Palpation/Percussion Palpation and Percussion of the abdomen reveal - Soft, Non Tender, No Rebound tenderness, No Rigidity (guarding) and No hepatosplenomegaly. Auscultation Auscultation of the abdomen  reveals - Bowel sounds normal.  Peripheral Vascular Upper Extremity Palpation - Pulses bilaterally normal.  Neurologic Neurologic evaluation reveals -alert and oriented x 3 with no impairment of recent or remote memory. Mental Status-Normal.  Neuropsychiatric The patient's mood and affect are described as -normal. Judgment and Insight-insight is appropriate concerning matters relevant to self.  Musculoskeletal Normal Exam - Left-Upper  Extremity Strength Normal and Lower Extremity Strength Normal. Normal Exam - Right-Upper Extremity Strength Normal and Lower Extremity Strength Normal.  Lymphatic Head & Neck  General Head & Neck Lymphatics: Bilateral - Description - Normal. Axillary - Did not examine. Femoral & Inguinal - Did not examine.    Assessment & Plan Randall Hiss M. Grzesiak MD; 10/01/2020 11:28 AM)  SEVERE OBESITY (E66.01) Story: I think the patient is a candidate for weight loss surgery. G  This patient encounter took 28 minutes today to perform the following: take history, perform exam, review outside records, interpret imaging, counsel the patient on their diagnosis and document encounter, findings & plan in the EHR Impression: The patient meets weight loss surgery criteria. I think the patient would be an acceptable candidate for Laparoscopic vertical sleeve gastrectomy.  We rediscussed LSG. We reviewed her workup. We discussed her imaging results, cardiology evaluation, nutritional and psychological assessment. She has attended her preoperative education class. We discussed the typical hospitalization the typical recovery. We discussed the typical issues that we see perioperatively. We discussed the importance of the preoperative meal plan  Current Plans Pt Education - EMW_preopbariatric  HISTORY OF COLON POLYPS (Z86.010) Impression: Due for colonoscopy with Dr. Ardis Hughs ; scheduled for this for tomorrow. 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  The plan would be to work around her fat-containing ventral hernia during her sleeve gastrectomy, lose weight, and then go back 6-9 months later for hernia repair to minimize chance of recurrent  We did discuss that we may have to deal with her fat-containing upper midline ventral hernia during surgery. I think this unlikely. It is so if she has a large defect we would leave it alone. It had small defect we would have to do a primary repair which revealed high risk for recurrence.   PREDIABETES (R73.03)   PRIMARY HYPERTENSION (I10) Impression: Evaluated and cleared by cardiology   HYPERCHOLESTEREMIA (E78.00)  Leighton Ruff. Redmond Pulling, MD, FACS General, Bariatric, & Minimally Invasive Surgery [facility], [place]

## 2020-10-02 ENCOUNTER — Ambulatory Visit (HOSPITAL_COMMUNITY): Payer: Medicare Other | Admitting: Anesthesiology

## 2020-10-02 ENCOUNTER — Encounter (HOSPITAL_COMMUNITY)
Admission: RE | Disposition: A | Discharge status: Home / Self Care | Payer: Self-pay | Source: Home / Self Care | Attending: Gastroenterology

## 2020-10-02 ENCOUNTER — Encounter (HOSPITAL_COMMUNITY): Payer: Self-pay | Admitting: Gastroenterology

## 2020-10-02 ENCOUNTER — Other Ambulatory Visit: Payer: Self-pay

## 2020-10-02 ENCOUNTER — Ambulatory Visit (HOSPITAL_COMMUNITY)
Admission: RE | Admit: 2020-10-02 | Discharge: 2020-10-02 | Disposition: A | Discharge status: Home / Self Care | Payer: Medicare Other | Attending: Gastroenterology | Admitting: Gastroenterology

## 2020-10-02 DIAGNOSIS — Z885 Allergy status to narcotic agent status: Secondary | ICD-10-CM | POA: Diagnosis not present

## 2020-10-02 DIAGNOSIS — Z6841 Body Mass Index (BMI) 40.0 and over, adult: Secondary | ICD-10-CM | POA: Insufficient documentation

## 2020-10-02 DIAGNOSIS — Z882 Allergy status to sulfonamides status: Secondary | ICD-10-CM | POA: Diagnosis not present

## 2020-10-02 DIAGNOSIS — Z1211 Encounter for screening for malignant neoplasm of colon: Secondary | ICD-10-CM | POA: Insufficient documentation

## 2020-10-02 DIAGNOSIS — K439 Ventral hernia without obstruction or gangrene: Secondary | ICD-10-CM | POA: Diagnosis not present

## 2020-10-02 DIAGNOSIS — Z86010 Personal history of colonic polyps: Secondary | ICD-10-CM | POA: Diagnosis not present

## 2020-10-02 DIAGNOSIS — E559 Vitamin D deficiency, unspecified: Secondary | ICD-10-CM | POA: Diagnosis not present

## 2020-10-02 DIAGNOSIS — K56609 Unspecified intestinal obstruction, unspecified as to partial versus complete obstruction: Secondary | ICD-10-CM | POA: Diagnosis not present

## 2020-10-02 DIAGNOSIS — E785 Hyperlipidemia, unspecified: Secondary | ICD-10-CM | POA: Diagnosis not present

## 2020-10-02 HISTORY — PX: COLONOSCOPY WITH PROPOFOL: SHX5780

## 2020-10-02 LAB — GLUCOSE, CAPILLARY: Glucose-Capillary: 107 mg/dL — ABNORMAL HIGH (ref 70–99)

## 2020-10-02 SURGERY — COLONOSCOPY WITH PROPOFOL
Anesthesia: Monitor Anesthesia Care

## 2020-10-02 MED ORDER — PHENYLEPHRINE 40 MCG/ML (10ML) SYRINGE FOR IV PUSH (FOR BLOOD PRESSURE SUPPORT)
PREFILLED_SYRINGE | INTRAVENOUS | Status: DC | PRN
  Administered 2020-10-02: 200 ug via INTRAVENOUS

## 2020-10-02 MED ORDER — PROPOFOL 500 MG/50ML IV EMUL
INTRAVENOUS | Status: DC | PRN
  Administered 2020-10-02: 150 ug/kg/min via INTRAVENOUS

## 2020-10-02 MED ORDER — LACTATED RINGERS IV SOLN
INTRAVENOUS | Status: DC
  Administered 2020-10-02: 1000 mL via INTRAVENOUS

## 2020-10-02 MED ORDER — SODIUM CHLORIDE 0.9 % IV SOLN
INTRAVENOUS | Status: DC
Start: 2020-10-02 — End: 2020-10-02

## 2020-10-02 MED ORDER — PROPOFOL 10 MG/ML IV BOLUS
INTRAVENOUS | Status: DC | PRN
  Administered 2020-10-02: 50 mg via INTRAVENOUS

## 2020-10-02 SURGICAL SUPPLY — 22 items

## 2020-10-02 NOTE — Op Note (Signed)
[facility] Patient Name: Leah Hampton Procedure Date: 10/02/2020 MRN: [id number] Attending MD: Milus Banister , MD Date of Birth: Aug 01, 1955 CSN: [id number] Age: 65 Admit Type: Inpatient Procedure:                Colonoscopy Indications:              High risk colon cancer surveillance: Personal                            history of colonic polyps; Colonoscopy 2011 three                            TAs, one was >1cm. Colonoscoyp 2016 was normal. Providers:                Milus Banister, MD, Estanislado Emms RN, Ladona Ridgel, Technician Referring MD:              Medicines:                Monitored Anesthesia Care Complications:            No immediate complications. Estimated blood loss:                            None. Estimated Blood Loss:     Estimated blood loss: none. Procedure:                Pre-Anesthesia Assessment:                           - Prior to the procedure, a History and Physical                            was performed, and patient medications and                            allergies were reviewed. The patient's tolerance of                            previous anesthesia was also reviewed. The risks                            and benefits of the procedure and the sedation                            options and risks were discussed with the patient.                            All questions were answered, and informed consent                            was obtained. Prior Anticoagulants: The patient has  taken no previous anticoagulant or antiplatelet                            agents. ASA Grade Assessment: IV - A patient with                            severe systemic disease that is a constant threat                            to life. After reviewing the risks and benefits,                            the patient was deemed in satisfactory condition to                            undergo the  procedure.                           After obtaining informed consent, the colonoscope                            was passed under direct vision. Throughout the                            procedure, the patient's blood pressure, pulse, and                            oxygen saturations were monitored continuously. The                            CF-HQ190L ([id number]) Olympus colonoscope was                            introduced through the anus and advanced to the the                            cecum, identified by appendiceal orifice and                            ileocecal valve. The colonoscopy was performed                            without difficulty. The patient tolerated the                            procedure well. The quality of the bowel                            preparation was good. The ileocecal valve,                            appendiceal orifice, and rectum were photographed. Scope In: 9:18:10 AM Scope Out: 9:39:46 AM Scope Withdrawal Time: 0 hours 5 minutes 39 seconds  Total Procedure Duration: 0  hours 21 minutes 36 seconds  Findings:      There were two areas of unsual, puckering, extrinsic compression that       limited scope passage around 70cm from this anus. With abdominal       pressure, fine control of colonoscope I was able to traverse these areas       and then complete the evaluation of the colon to the cecum. During the       case I was concerned that this represented a short segment of colon that       had herniated through somewhere.      The entire examined colon was otherwise normal on direct and       retroflexion views. Impression:               - There were two areas of unsual, puckering,                            extrinsic compression that limited scope passage                            around 70cm from this anus. With abdominal                            pressure, fine control of colonoscope I was able to                            traverse these  areas and then complete the                            evaluation of the colon to the cecum. During the                            case I was concerned that this represented a short                            segment of colon that had herniated through                            somewhere. Reviewing CT 2021 there was a 8.9cm                            ventral hernia containing fat only immediately                            adjacent to mid colon. I think her colon is at                            least intermittently herniating at the site as well.                           - No polyps or cancers. Moderate Sedation:      Not Applicable - Patient had care per Anesthesia. Recommendation:           - Patient has a contact number available for  emergencies. The signs and symptoms of potential                            delayed complications were discussed with the                            patient. Return to normal activities tomorrow.                            Written discharge instructions were provided to the                            patient.                           - Resume previous diet.                           - Continue present medications.                           - Repeat colonoscopy in 10 years for screening                            purposes.                           - I will communicate my thought about likely colon                            herniation to Dr. Redmond Pulling, perhaps this could be                            addressed at time of upcoming bariatric surgery? Procedure Code(s):        --- Professional ---                           [phone], Colonoscopy, flexible; diagnostic, including                            collection of specimen(s) by brushing or washing,                            when performed (separate procedure) Diagnosis Code(s):        --- Professional ---                           Z86.010, Personal history of colonic polyps CPT  copyright 2019 American Medical Association. All rights reserved. The codes documented in this report are preliminary and upon coder review may  be revised to meet current compliance requirements. Milus Banister, MD 10/02/2020 10:06:30 AM This report has been signed electronically. Number of Addenda: 0

## 2020-10-02 NOTE — Discharge Instructions (Signed)
YOU HAD AN ENDOSCOPIC PROCEDURE TODAY: Refer to the procedure report and other information in the discharge instructions given to you for any specific questions about what was found during the examination. If this information does not answer your questions, please call [place] office at [phone] to clarify.  ° °YOU SHOULD EXPECT: Some feelings of bloating in the abdomen. Passage of more gas than usual. Walking can help get rid of the air that was put into your GI tract during the procedure and reduce the bloating. If you had a lower endoscopy (such as a colonoscopy or flexible sigmoidoscopy) you may notice spotting of blood in your stool or on the toilet paper. Some abdominal soreness may be present for a day or two, also. ° °DIET: Your first meal following the procedure should be a light meal and then it is ok to progress to your normal diet. A half-sandwich or bowl of soup is an example of a good first meal. Heavy or fried foods are harder to digest and may make you feel nauseous or bloated. Drink plenty of fluids but you should avoid alcoholic beverages for 24 hours. If you had a esophageal dilation, please see attached instructions for diet.   ° °ACTIVITY: Your care partner should take you home directly after the procedure. You should plan to take it easy, moving slowly for the rest of the day. You can resume normal activity the day after the procedure however YOU SHOULD NOT DRIVE, use power tools, machinery or perform tasks that involve climbing or major physical exertion for 24 hours (because of the sedation medicines used during the test).  ° °SYMPTOMS TO REPORT IMMEDIATELY: °A gastroenterologist can be reached at any hour. Please call [phone]  for any of the following symptoms:  °Following lower endoscopy (colonoscopy, flexible sigmoidoscopy) °Excessive amounts of blood in the stool  °Significant tenderness, worsening of abdominal pains  °Swelling of the abdomen that is new, acute  °Fever of 100° or  higher  °Following upper endoscopy (EGD, EUS, ERCP, esophageal dilation) °Vomiting of blood or coffee ground material  °New, significant abdominal pain  °New, significant chest pain or pain under the shoulder blades  °Painful or persistently difficult swallowing  °New shortness of breath  °Black, tarry-looking or red, bloody stools ° °FOLLOW UP:  °If any biopsies were taken you will be contacted by phone or by letter within the next 1-3 weeks. Call [phone]  if you have not heard about the biopsies in 3 weeks.  °Please also call with any specific questions about appointments or follow up tests. ° °

## 2020-10-02 NOTE — Transfer of Care (Signed)
Immediate Anesthesia Transfer of Care Note  Patient: Leah Hampton  Procedure(s) Performed: COLONOSCOPY WITH PROPOFOL (N/A )  Patient Location: PACU and Endoscopy Unit  Anesthesia Type:MAC  Level of Consciousness: awake, alert  and oriented  Airway & Oxygen Therapy: Patient Spontanous Breathing and Patient connected to face mask  Post-op Assessment: Report given to RN and Post -op Vital signs reviewed and stable  Post vital signs: Reviewed and stable  Last Vitals:  Vitals Value Taken Time  BP    Temp    Pulse    Resp    SpO2      Last Pain:  Vitals:   10/02/20 0902  TempSrc: Oral  PainSc: 0-No pain         Complications: No complications documented.

## 2020-10-02 NOTE — Anesthesia Postprocedure Evaluation (Signed)
Anesthesia Post Note  Patient: Leah Hampton  Procedure(s) Performed: COLONOSCOPY WITH PROPOFOL (N/A )     Patient location during evaluation: Endoscopy Anesthesia Type: MAC Level of consciousness: awake and alert Pain management: pain level controlled Vital Signs Assessment: post-procedure vital signs reviewed and stable Respiratory status: spontaneous breathing, nonlabored ventilation, respiratory function stable and patient connected to nasal cannula oxygen Cardiovascular status: blood pressure returned to baseline and stable Postop Assessment: no apparent nausea or vomiting Anesthetic complications: no   No complications documented.  Last Vitals:  Vitals:   10/02/20 1005 10/02/20 1010  BP:  (!) 156/82  Pulse: 91 88  Resp: 18 (!) 25  Temp:    SpO2: 96% 98%    Last Pain:  Vitals:   10/02/20 1000  TempSrc:   PainSc: 0-No pain                 Barnet Glasgow

## 2020-10-02 NOTE — H&P (Signed)
HPI: This is a woman with h/o adenomatous polyps, here for surveillance colonoscopy. Morbid obesity   ROS: complete GI ROS as described in HPI, all other review negative.  Constitutional:  No unintentional weight loss   Past Medical History:  Diagnosis Date  . Bronchitis   . Chronic bronchitis ([place])   . Cystitis   . Diabetes type 2, controlled ([place])   . Hematuria    urologice eval, Dr Estill Dooms  . History of chest pain   . Hyperbilirubinemia   . Hyperlipemia   . Hypertelorism   . Hypertension   . Leukocytosis   . Obesity   . Osteoarthritis    right knee  . Sleep apnea 2012   slight   . Vaginal Pap smear, abnormal   . Vitamin D deficiency 02/09/2018    Past Surgical History:  Procedure Laterality Date  . CERVICAL POLYPECTOMY  07/15/2020   Procedure: POLYPECTOMY with Myosure;  Surgeon: Lavonia Drafts, MD;  Location: [facility];  Service: Gynecology;;  . COLONOSCOPY WITH PROPOFOL N/A 03/27/2015   Procedure: COLONOSCOPY WITH PROPOFOL;  Surgeon: Milus Banister, MD;  Location: [facility];  Service: Endoscopy;  Laterality: N/A;  . COLPOSCOPY N/A 07/15/2020   Procedure: COLPOSCOPY;  Surgeon: Lavonia Drafts, MD;  Location: [facility];  Service: Gynecology;  Laterality: N/A;  . DILATATION & CURETTAGE/HYSTEROSCOPY WITH MYOSURE N/A 01/05/2017   Procedure: DILATATION & CURETTAGE/HYSTEROSCOPY WITH MYOSURE;  Surgeon: Thurnell Lose, MD;  Location: [facility];  Service: Gynecology;  Laterality: N/A;  PostMenopausal Bleeding  . HYSTEROSCOPY WITH D & C N/A 05/22/2019   Procedure: DILATATION AND CURETTAGE /HYSTEROSCOPY, POLYPECTOMY WITH MYOSURE;  Surgeon: Lavonia Drafts, MD;  Location: [facility];  Service: Gynecology;  Laterality: N/A;  . HYSTEROSCOPY WITH D & C N/A 07/15/2020   Procedure: DILATATION AND CURETTAGE /HYSTEROSCOPY;  Surgeon: Lavonia Drafts, MD;  Location: [place];  Service: Gynecology;  Laterality: N/A;  . KNEE ARTHROSCOPY  04/2007   left knee  . TOTAL KNEE  ARTHROPLASTY Left 10/2008   Dr Roxine Caddy    Current Facility-Administered Medications  Medication Dose Route Frequency Provider Last Rate Last Admin  . 0.9 %  sodium chloride infusion   Intravenous Continuous Milus Banister, MD        Allergies as of 08/12/2020 - Review Complete 08/12/2020  Allergen Reaction Noted  . Sulfamethoxazole-trimethoprim [bactrim] Itching 08/31/2010  . Codeine Nausea And Vomiting and Anxiety 07/03/2008    Family History  Problem Relation Age of Onset  . Heart failure Mother   . Arthritis Mother   . Hypertension Mother   . Stroke Mother   . Diabetes Mellitus II Mother   . Heart attack Father 14  . Diabetes Other   . Arthritis Other   . Stroke Other   . Coronary artery disease Other   . Hypertension Sister        x 2  . CVA Sister   . CAD Sister 74  . Diabetes Mellitus II Sister   . Cirrhosis Brother   . Alcohol abuse Brother   . Colon cancer Neg Hx     Social History   Socioeconomic History  . Marital status: Single    Spouse name: Not on file  . Number of children: 2  . Years of education: Not on file  . Highest education level: Not on file  Occupational History  . Occupation: disabled  Tobacco Use  . Smoking status: Never Smoker  . Smokeless tobacco: Never Used  Vaping Use  . Vaping Use:  Never used  Substance and Sexual Activity  . Alcohol use: No  . Drug use: No  . Sexual activity: Not Currently  Other Topics Concern  . Not on file  Social History Narrative   Regular exercise:  No   Lives alone   Has 2 grown sons, both local.  1 grandson   On disability due to her knee pain   Completed 12th grade   Enjoys television   No pets.    Social Determinants of Health   Financial Resource Strain: Not on file  Food Insecurity: No Food Insecurity  . Worried About Charity fundraiser in the Last Year: Never true  . Ran Out of Food in the Last Year: Never true  Transportation Needs: Not on file  Physical Activity: Not on file   Stress: Not on file  Social Connections: Not on file  Intimate Partner Violence: Not on file     Physical Exam: Ht 5\' 2"  (1.575 m)   Wt (!) 141.1 kg   LMP 11/10/2011   BMI 56.88 kg/m  Constitutional: generally well-appearing Psychiatric: alert and oriented x3 Abdomen: soft, nontender, nondistended, no obvious ascites, no peritoneal signs, normal bowel sounds No peripheral edema noted in lower extremities  Assessment and plan: 65 y.o. female with history of adenomatous polyps  For surveillance colonoscopy today  Please see the "Patient Instructions" section for addition details about the plan.  Owens Loffler, MD [facility] 10/02/2020, 8:45 AM

## 2020-10-06 NOTE — Patient Instructions (Signed)
DUE TO COVID-19 ONLY ONE VISITOR IS ALLOWED TO COME WITH YOU AND STAY IN THE WAITING ROOM ONLY DURING PRE OP AND PROCEDURE DAY OF SURGERY. THE 1 VISITOR  MAY VISIT WITH YOU AFTER SURGERY IN YOUR PRIVATE ROOM DURING VISITING HOURS ONLY!  YOU NEED TO HAVE A COVID 19 TEST ON: 10/10/20 @ 12:00 PM, THIS TEST MUST BE DONE BEFORE SURGERY,  COVID TESTING SITE [place] [id number], IT IS ON THE RIGHT GOING OUT [address] APPROXIMATELY  2 MINUTES PAST [employer] ON THE RIGHT. ONCE YOUR COVID TEST IS COMPLETED,  PLEASE BEGIN THE QUARANTINE INSTRUCTIONS AS OUTLINED IN YOUR HANDOUT.                Leah Hampton   Your procedure is scheduled on: 10/14/20   Report to [facility] Main  Entrance   Report to short stay at: 5:30 AM     Call this number if you have problems the morning of surgery [phone]    Remember:  MORNING OF SURGERY DRINK:   DRINK 1 G2 drink BEFORE YOU LEAVE HOME ( 4:15 AM), DRINK ALL OF THE  G2 DRINK AT ONE TIME.   NO SOLID FOOD AFTER 600 PM THE NIGHT BEFORE YOUR SURGERY. YOU MAY DRINK CLEAR FLUIDS. THE G2 DRINK YOU DRINK BEFORE YOU LEAVE HOME WILL BE THE LAST FLUIDS YOU DRINK BEFORE SURGERY.  PAIN IS EXPECTED AFTER SURGERY AND WILL NOT BE COMPLETELY ELIMINATED. AMBULATION AND TYLENOL WILL HELP REDUCE INCISIONAL AND GAS PAIN. MOVEMENT IS KEY!  YOU ARE EXPECTED TO BE OUT OF BED WITHIN 4 HOURS OF ADMISSION TO YOUR PATIENT ROOM.  SITTING IN THE RECLINER THROUGHOUT THE DAY IS IMPORTANT FOR DRINKING FLUIDS AND MOVING GAS THROUGHOUT THE GI TRACT.  COMPRESSION STOCKINGS SHOULD BE WORN [place] UNLESS YOU ARE WALKING.   INCENTIVE SPIROMETER SHOULD BE USED EVERY HOUR WHILE AWAKE TO DECREASE POST-OPERATIVE COMPLICATIONS SUCH AS PNEUMONIA.  WHEN DISCHARGED HOME, IT IS IMPORTANT TO CONTINUE TO WALK EVERY HOUR AND USE THE INCENTIVE SPIROMETER EVERY HOUR.   BRUSH YOUR TEETH MORNING OF SURGERY AND RINSE YOUR MOUTH OUT, NO  CHEWING GUM CANDY OR MINTS.    Take these medicines the morning of surgery with A SIP OF WATER: omeprazole,valtrex.                                You may not have any metal on your body including hair pins and              piercings  Do not wear jewelry, make-up, lotions, powders or perfumes, deodorant             Do not wear nail polish on your fingernails.  Do not shave  48 hours prior to surgery.    Do not bring valuables to the hospital. [name].  Contacts, dentures or bridgework may not be worn into surgery.  Leave suitcase in the car. After surgery it may be brought to your room.     Patients discharged the day of surgery will not be allowed to drive home. IF YOU ARE HAVING SURGERY AND GOING HOME THE SAME DAY, YOU MUST HAVE AN ADULT TO DRIVE YOU HOME AND BE WITH YOU FOR 24 HOURS. YOU MAY GO HOME BY TAXI  OR UBER OR ORTHERWISE, BUT AN ADULT MUST ACCOMPANY YOU HOME AND STAY WITH YOU FOR 24 HOURS.  Name and phone number of your driver:  Special Instructions: N/A              Please read over the following fact sheets you were given: _____________________________________________________________________         [facility] - Preparing for Surgery Before surgery, you can play an important role.  Because skin is not sterile, your skin needs to be as free of germs as possible.  You can reduce the number of germs on your skin by washing with CHG (chlorahexidine gluconate) soap before surgery.  CHG is an antiseptic cleaner which kills germs and bonds with the skin to continue killing germs even after washing. Please DO NOT use if you have an allergy to CHG or antibacterial soaps.  If your skin becomes reddened/irritated stop using the CHG and inform your nurse when you arrive at Short Stay. Do not shave (including legs and underarms) for at least 48 hours prior to the first CHG shower.  You may shave your face/neck. Please follow these  instructions carefully:  1.  Shower with CHG Soap the night before surgery and the  morning of Surgery.  2.  If you choose to wash your hair, wash your hair first as usual with your  normal  shampoo.  3.  After you shampoo, rinse your hair and body thoroughly to remove the  shampoo.                           4.  Use CHG as you would any other liquid soap.  You can apply chg directly  to the skin and wash                       Gently with a scrungie or clean washcloth.  5.  Apply the CHG Soap to your body ONLY FROM THE NECK DOWN.   Do not use on face/ open                           Wound or open sores. Avoid contact with eyes, ears mouth and genitals (private parts).                       Wash face,  Genitals (private parts) with your normal soap.             6.  Wash thoroughly, paying special attention to the area where your surgery  will be performed.  7.  Thoroughly rinse your body with warm water from the neck down.  8.  DO NOT shower/wash with your normal soap after using and rinsing off  the CHG Soap.                9.  Pat yourself dry with a clean towel.            10.  Wear clean pajamas.            11.  Place clean sheets on your bed the night of your first shower and do not  sleep with pets. Day of Surgery : Do not apply any lotions/deodorants the morning of surgery.  Please wear clean clothes to the hospital/surgery center.  FAILURE TO FOLLOW THESE INSTRUCTIONS MAY RESULT IN THE CANCELLATION OF YOUR SURGERY PATIENT SIGNATURE_________________________________  NURSE SIGNATURE__________________________________  ________________________________________________________________________

## 2020-10-07 ENCOUNTER — Other Ambulatory Visit: Payer: Self-pay

## 2020-10-07 ENCOUNTER — Encounter (HOSPITAL_COMMUNITY): Payer: Self-pay

## 2020-10-07 ENCOUNTER — Encounter (HOSPITAL_COMMUNITY)
Admission: RE | Admit: 2020-10-07 | Discharge: 2020-10-07 | Disposition: A | Discharge status: Home / Self Care | Payer: Medicare Other | Source: Ambulatory Visit | Attending: General Surgery | Admitting: General Surgery

## 2020-10-07 DIAGNOSIS — Z01812 Encounter for preprocedural laboratory examination: Secondary | ICD-10-CM | POA: Insufficient documentation

## 2020-10-07 LAB — CBC WITH DIFFERENTIAL/PLATELET
Abs Immature Granulocytes: 0.04 10*3/uL (ref 0.00–0.07)
Basophils Absolute: 0 10*3/uL (ref 0.0–0.1)
Basophils Relative: 0 %
Eosinophils Absolute: 0.1 10*3/uL (ref 0.0–0.5)
Eosinophils Relative: 2 %
HCT: 38 % (ref 36.0–46.0)
Hemoglobin: 12.4 g/dL (ref 12.0–15.0)
Immature Granulocytes: 1 %
Lymphocytes Relative: 37 %
Lymphs Abs: 3.1 10*3/uL (ref 0.7–4.0)
MCH: 28.6 pg (ref 26.0–34.0)
MCHC: 32.6 g/dL (ref 30.0–36.0)
MCV: 87.8 fL (ref 80.0–100.0)
Monocytes Absolute: 0.6 10*3/uL (ref 0.1–1.0)
Monocytes Relative: 7 %
Neutro Abs: 4.5 10*3/uL (ref 1.7–7.7)
Neutrophils Relative %: 53 %
Platelets: 318 10*3/uL (ref 150–400)
RBC: 4.33 MIL/uL (ref 3.87–5.11)
RDW: 16 % — ABNORMAL HIGH (ref 11.5–15.5)
WBC: 8.5 10*3/uL (ref 4.0–10.5)
nRBC: 0 % (ref 0.0–0.2)

## 2020-10-07 LAB — COMPREHENSIVE METABOLIC PANEL
ALT: 28 U/L (ref 0–44)
AST: 28 U/L (ref 15–41)
Albumin: 3.8 g/dL (ref 3.5–5.0)
Alkaline Phosphatase: 55 U/L (ref 38–126)
Anion gap: 8 (ref 5–15)
BUN: 22 mg/dL (ref 8–23)
CO2: 31 mmol/L (ref 22–32)
Calcium: 9.6 mg/dL (ref 8.9–10.3)
Chloride: 102 mmol/L (ref 98–111)
Creatinine, Ser: 0.99 mg/dL (ref 0.44–1.00)
GFR, Estimated: >60 mL/min (ref >60)
Glucose, Bld: 105 mg/dL — ABNORMAL HIGH (ref 70–99)
Potassium: 3.8 mmol/L (ref 3.5–5.1)
Sodium: 141 mmol/L (ref 135–145)
Total Bilirubin: 0.4 mg/dL (ref 0.3–1.2)
Total Protein: 7.7 g/dL (ref 6.5–8.1)

## 2020-10-07 LAB — GLUCOSE, CAPILLARY: Glucose-Capillary: 91 mg/dL (ref 70–99)

## 2020-10-07 NOTE — Progress Notes (Signed)
COVID Vaccine Completed: Yes Date COVID Vaccine completed: 03/14/20 [place] COVID vaccine manufacturer: [place]      PCP - Lenna Sciara O'Sullivan:NP Cardiologist - Dr. Oswaldo Milian  Chest x-ray - 02/14/20 EKG - 02/26/20 Stress Test -  ECHO - 03/18/20 Cardiac Cath -  Pacemaker/ICD device last checked: Myoview: 08/28/20  Sleep Study - Yes CPAP - No  Fasting Blood Sugar - N/A Checks Blood Sugar __0___ times a day  Blood Thinner Instructions: Aspirin Instructions: Last Dose:  Anesthesia review: Hx: HTN,OSA(No CPAP),DIA   Patient denies shortness of breath, fever, cough and chest pain at PAT appointment   Patient verbalized understanding of instructions that were given to them at the PAT appointment. Patient was also instructed that they will need to review over the PAT instructions again at home before surgery.

## 2020-10-08 ENCOUNTER — Other Ambulatory Visit: Payer: Self-pay | Admitting: *Deleted

## 2020-10-08 LAB — HEMOGLOBIN A1C
Hgb A1c MFr Bld: 6.7 % — ABNORMAL HIGH (ref 4.8–5.6)
Mean Plasma Glucose: 146 mg/dL

## 2020-10-08 MED ORDER — DICLOFENAC SODIUM 1 % EX GEL: 2.0000 g | Freq: Four times a day (QID) | CUTANEOUS | 1 refills | Status: DC

## 2020-10-10 ENCOUNTER — Other Ambulatory Visit (HOSPITAL_COMMUNITY)
Admission: RE | Admit: 2020-10-10 | Discharge: 2020-10-10 | Disposition: A | Discharge status: Home / Self Care | Payer: Medicare Other | Source: Ambulatory Visit | Attending: General Surgery | Admitting: General Surgery

## 2020-10-10 DIAGNOSIS — Z01812 Encounter for preprocedural laboratory examination: Secondary | ICD-10-CM | POA: Insufficient documentation

## 2020-10-10 DIAGNOSIS — Z20822 Contact with and (suspected) exposure to covid-19: Secondary | ICD-10-CM | POA: Insufficient documentation

## 2020-10-10 LAB — SARS CORONAVIRUS 2 (TAT 6-24 HRS): SARS Coronavirus 2: NEGATIVE

## 2020-10-13 MED ORDER — BUPIVACAINE LIPOSOME 1.3 % IJ SUSP
20.0000 mL | Freq: Once | INTRAMUSCULAR | Status: DC
  Filled 2020-10-13: qty 20

## 2020-10-13 NOTE — Anesthesia Preprocedure Evaluation (Addendum)
Anesthesia Evaluation  Patient identified by MRN, date of birth, ID band Patient awake    Reviewed: Allergy & Precautions, NPO status , Patient's Chart, lab work & pertinent test results  Airway Mallampati: II  TM Distance: >3 FB Neck ROM: Full    Dental  (+) Dental Advisory Given, Teeth Intact   Pulmonary sleep apnea , COPD,    Pulmonary exam normal breath sounds clear to auscultation       Cardiovascular hypertension, Pt. on medications Normal cardiovascular exam Rhythm:Regular Rate:Normal     Neuro/Psych  Neuromuscular disease    GI/Hepatic negative GI ROS, Neg liver ROS,   Endo/Other  diabetesMorbid obesity  Renal/GU negative Renal ROS     Musculoskeletal  (+) Arthritis ,   Abdominal (+) + obese,   Peds  Hematology negative hematology ROS (+)   Anesthesia Other Findings   Reproductive/Obstetrics negative OB ROS                                                             Anesthesia Evaluation  Patient identified by MRN, date of birth, ID band Patient awake    Reviewed: Allergy & Precautions, NPO status , Patient's Chart, lab work & pertinent test results  Airway Mallampati: II  TM Distance: >3 FB Neck ROM: Full    Dental no notable dental hx. (+) Dental Advisory Given   Pulmonary sleep apnea , COPD,  COPD inhaler,    Pulmonary exam normal breath sounds clear to auscultation       Cardiovascular hypertension, Pt. on medications Normal cardiovascular exam Rhythm:Regular Rate:Normal  09/05/20 Lexiscan  Impression Myocardial perfusion is normal.   The study is normal.   This is a low risk study.  Overall left ventricular systolic function was normal.    LV cavity size is normal.  Nuclear stress EF:  64%.  The left ventricular ejection fraction is normal (55-65%). There is no prior study for comparison.    Neuro/Psych    GI/Hepatic GERD  ,  Endo/Other  diabetes,  Type 2Morbid obesity  Renal/GU      Musculoskeletal   Abdominal (+) + obese,   Peds  Hematology   Anesthesia Other Findings ALL codeine Bactrim  Reproductive/Obstetrics                            Anesthesia Physical Anesthesia Plan  ASA: III  Anesthesia Plan: MAC   Post-op Pain Management:    Induction:   PONV Risk Score and Plan: Treatment may vary due to age or medical condition  Airway Management Planned: Natural Airway  Additional Equipment: None  Intra-op Plan:   Post-operative Plan:   Informed Consent:     Dental advisory given  Plan Discussed with:   Anesthesia Plan Comments: (Colonoscopy for history of colon polyps)        Anesthesia Quick Evaluation  Anesthesia Physical Anesthesia Plan  ASA: III  Anesthesia Plan: General   Post-op Pain Management:    Induction: Intravenous  PONV Risk Score and Plan: 4 or greater and Ondansetron, Aprepitant, Dexamethasone, Treatment may vary due to age or medical condition, Midazolam and Diphenhydramine  Airway Management Planned: Oral ETT  Additional Equipment: None  Intra-op Plan:   Post-operative Plan: Extubation in OR  Informed  Consent: I have reviewed the patients History and Physical, chart, labs and discussed the procedure including the risks, benefits and alternatives for the proposed anesthesia with the patient or authorized representative who has indicated his/her understanding and acceptance.     Dental advisory given  Plan Discussed with: CRNA  Anesthesia Plan Comments:         Anesthesia Quick Evaluation

## 2020-10-14 ENCOUNTER — Inpatient Hospital Stay (HOSPITAL_COMMUNITY): Payer: Medicare Other | Admitting: Anesthesiology

## 2020-10-14 ENCOUNTER — Encounter (HOSPITAL_COMMUNITY)
Admission: RE | Disposition: A | Discharge status: Home / Self Care | Payer: Self-pay | Source: Home / Self Care | Attending: General Surgery

## 2020-10-14 ENCOUNTER — Encounter (HOSPITAL_COMMUNITY): Payer: Self-pay | Admitting: General Surgery

## 2020-10-14 ENCOUNTER — Inpatient Hospital Stay (HOSPITAL_COMMUNITY)
Admission: RE | Admit: 2020-10-14 | Discharge: 2020-10-15 | DRG: 621 | Disposition: A | Discharge status: Home / Self Care | Payer: Medicare Other | Attending: General Surgery | Admitting: General Surgery

## 2020-10-14 DIAGNOSIS — E7800 Pure hypercholesterolemia, unspecified: Secondary | ICD-10-CM | POA: Diagnosis not present

## 2020-10-14 DIAGNOSIS — E785 Hyperlipidemia, unspecified: Secondary | ICD-10-CM | POA: Diagnosis present

## 2020-10-14 DIAGNOSIS — M1711 Unilateral primary osteoarthritis, right knee: Secondary | ICD-10-CM | POA: Diagnosis not present

## 2020-10-14 DIAGNOSIS — I10 Essential (primary) hypertension: Secondary | ICD-10-CM | POA: Diagnosis not present

## 2020-10-14 DIAGNOSIS — E119 Type 2 diabetes mellitus without complications: Secondary | ICD-10-CM | POA: Diagnosis not present

## 2020-10-14 DIAGNOSIS — Z882 Allergy status to sulfonamides status: Secondary | ICD-10-CM | POA: Diagnosis not present

## 2020-10-14 DIAGNOSIS — M545 Low back pain, unspecified: Secondary | ICD-10-CM | POA: Diagnosis not present

## 2020-10-14 DIAGNOSIS — K219 Gastro-esophageal reflux disease without esophagitis: Secondary | ICD-10-CM | POA: Diagnosis present

## 2020-10-14 DIAGNOSIS — J449 Chronic obstructive pulmonary disease, unspecified: Secondary | ICD-10-CM | POA: Diagnosis not present

## 2020-10-14 DIAGNOSIS — E559 Vitamin D deficiency, unspecified: Secondary | ICD-10-CM | POA: Diagnosis not present

## 2020-10-14 DIAGNOSIS — G4733 Obstructive sleep apnea (adult) (pediatric): Secondary | ICD-10-CM | POA: Diagnosis not present

## 2020-10-14 DIAGNOSIS — Z885 Allergy status to narcotic agent status: Secondary | ICD-10-CM

## 2020-10-14 DIAGNOSIS — Z8261 Family history of arthritis: Secondary | ICD-10-CM

## 2020-10-14 DIAGNOSIS — Z8249 Family history of ischemic heart disease and other diseases of the circulatory system: Secondary | ICD-10-CM

## 2020-10-14 DIAGNOSIS — Z8719 Personal history of other diseases of the digestive system: Secondary | ICD-10-CM | POA: Diagnosis not present

## 2020-10-14 DIAGNOSIS — G8929 Other chronic pain: Secondary | ICD-10-CM | POA: Diagnosis not present

## 2020-10-14 DIAGNOSIS — Z9884 Bariatric surgery status: Secondary | ICD-10-CM

## 2020-10-14 DIAGNOSIS — Z823 Family history of stroke: Secondary | ICD-10-CM | POA: Diagnosis not present

## 2020-10-14 DIAGNOSIS — Z6841 Body Mass Index (BMI) 40.0 and over, adult: Secondary | ICD-10-CM | POA: Diagnosis not present

## 2020-10-14 HISTORY — PX: UPPER GI ENDOSCOPY: SHX6162

## 2020-10-14 LAB — GLUCOSE, CAPILLARY
Glucose-Capillary: 111 mg/dL — ABNORMAL HIGH (ref 70–99)
Glucose-Capillary: 173 mg/dL — ABNORMAL HIGH (ref 70–99)
Glucose-Capillary: 182 mg/dL — ABNORMAL HIGH (ref 70–99)
Glucose-Capillary: 221 mg/dL — ABNORMAL HIGH (ref 70–99)
Glucose-Capillary: 184 mg/dL — ABNORMAL HIGH (ref 70–99)

## 2020-10-14 LAB — TYPE AND SCREEN
ABO/RH(D): O POS
Antibody Screen: NEGATIVE

## 2020-10-14 LAB — HEMOGLOBIN AND HEMATOCRIT, BLOOD
HCT: 40.7 % (ref 36.0–46.0)
Hemoglobin: 13.4 g/dL (ref 12.0–15.0)

## 2020-10-14 SURGERY — GASTRECTOMY, SLEEVE, ROBOT-ASSISTED
Anesthesia: General

## 2020-10-14 MED ORDER — ENOXAPARIN (LOVENOX) PATIENT EDUCATION KIT
PACK | Freq: Once | Status: AC
  Filled 2020-10-14: qty 1

## 2020-10-14 MED ORDER — 0.9 % SODIUM CHLORIDE (POUR BTL) OPTIME
TOPICAL | Status: DC | PRN
  Administered 2020-10-14: 1000 mL

## 2020-10-14 MED ORDER — MEPERIDINE HCL 50 MG/ML IJ SOLN: 6.2500 mg | INTRAMUSCULAR | Status: DC | PRN

## 2020-10-14 MED ORDER — SIMETHICONE 80 MG PO CHEW: 80.0000 mg | CHEWABLE_TABLET | Freq: Four times a day (QID) | ORAL | Status: DC | PRN

## 2020-10-14 MED ORDER — CHLORHEXIDINE GLUCONATE 0.12 % MT SOLN
15.0000 mL | Freq: Once | OROMUCOSAL | Status: AC
  Administered 2020-10-14: 15 mL via OROMUCOSAL

## 2020-10-14 MED ORDER — LACTATED RINGERS IV SOLN: INTRAVENOUS | Status: DC

## 2020-10-14 MED ORDER — OXYCODONE HCL 5 MG/5ML PO SOLN
5.0000 mg | Freq: Four times a day (QID) | ORAL | Status: DC | PRN
  Administered 2020-10-14: 5 mg via ORAL
  Filled 2020-10-14 (×2): qty 5

## 2020-10-14 MED ORDER — FENTANYL CITRATE (PF) 100 MCG/2ML IJ SOLN
INTRAMUSCULAR | Status: DC | PRN
  Administered 2020-10-14 (×2): 50 ug via INTRAVENOUS
  Administered 2020-10-14 (×2): 25 ug via INTRAVENOUS
  Administered 2020-10-14: 50 ug via INTRAVENOUS

## 2020-10-14 MED ORDER — DROPERIDOL 2.5 MG/ML IJ SOLN: 0.6250 mg | Freq: Once | INTRAMUSCULAR | Status: DC | PRN

## 2020-10-14 MED ORDER — EPHEDRINE SULFATE-NACL 50-0.9 MG/10ML-% IV SOSY
PREFILLED_SYRINGE | INTRAVENOUS | Status: DC | PRN
  Administered 2020-10-14: 5 mg via INTRAVENOUS

## 2020-10-14 MED ORDER — LIDOCAINE 2% (20 MG/ML) 5 ML SYRINGE
INTRAMUSCULAR | Status: AC
  Filled 2020-10-14: qty 5

## 2020-10-14 MED ORDER — PROPOFOL 10 MG/ML IV BOLUS
INTRAVENOUS | Status: AC
  Filled 2020-10-14: qty 40

## 2020-10-14 MED ORDER — FENTANYL CITRATE (PF) 100 MCG/2ML IJ SOLN
INTRAMUSCULAR | Status: AC
  Filled 2020-10-14: qty 2

## 2020-10-14 MED ORDER — ONDANSETRON HCL 4 MG/2ML IJ SOLN
INTRAMUSCULAR | Status: AC
  Filled 2020-10-14: qty 2

## 2020-10-14 MED ORDER — DIPHENHYDRAMINE HCL 50 MG/ML IJ SOLN
INTRAMUSCULAR | Status: DC | PRN
  Administered 2020-10-14: 6.25 mg via INTRAVENOUS

## 2020-10-14 MED ORDER — EPHEDRINE 5 MG/ML INJ
INTRAVENOUS | Status: AC
  Filled 2020-10-14: qty 10

## 2020-10-14 MED ORDER — HYDROCHLOROTHIAZIDE 25 MG PO TABS
25.0000 mg | ORAL_TABLET | Freq: Every day | ORAL | Status: DC
  Administered 2020-10-14 – 2020-10-15 (×2): 25 mg via ORAL
  Filled 2020-10-14 (×2): qty 1

## 2020-10-14 MED ORDER — APREPITANT 40 MG PO CAPS
40.0000 mg | ORAL_CAPSULE | ORAL | Status: AC
  Administered 2020-10-14: 40 mg via ORAL
  Filled 2020-10-14: qty 1

## 2020-10-14 MED ORDER — SODIUM CHLORIDE 0.9 % IV SOLN
12.5000 mg | Freq: Four times a day (QID) | INTRAVENOUS | Status: DC | PRN
  Filled 2020-10-14: qty 0.5

## 2020-10-14 MED ORDER — BUPIVACAINE LIPOSOME 1.3 % IJ SUSP
INTRAMUSCULAR | Status: DC | PRN
  Administered 2020-10-14: 20 mL

## 2020-10-14 MED ORDER — HYDROMORPHONE HCL 1 MG/ML IJ SOLN
0.2500 mg | INTRAMUSCULAR | Status: DC | PRN
  Administered 2020-10-14: 0.25 mg via INTRAVENOUS

## 2020-10-14 MED ORDER — STERILE WATER FOR IRRIGATION IR SOLN
Status: DC | PRN
  Administered 2020-10-14: 2000 mL

## 2020-10-14 MED ORDER — PHENYLEPHRINE HCL (PRESSORS) 10 MG/ML IV SOLN
INTRAVENOUS | Status: AC
  Filled 2020-10-14: qty 2

## 2020-10-14 MED ORDER — LIDOCAINE 2% (20 MG/ML) 5 ML SYRINGE
INTRAMUSCULAR | Status: DC | PRN
  Administered 2020-10-14: 8 mg via INTRAVENOUS
  Administered 2020-10-14: 1.5 mg/kg/h via INTRAVENOUS

## 2020-10-14 MED ORDER — DIPHENHYDRAMINE HCL 50 MG/ML IJ SOLN
INTRAMUSCULAR | Status: AC
  Filled 2020-10-14: qty 1

## 2020-10-14 MED ORDER — BUDESONIDE 0.5 MG/2ML IN SUSP
1.0000 mg | Freq: Two times a day (BID) | RESPIRATORY_TRACT | Status: DC
  Administered 2020-10-14 – 2020-10-15 (×2): 1 mg via RESPIRATORY_TRACT
  Filled 2020-10-14 (×3): qty 4

## 2020-10-14 MED ORDER — LACTATED RINGERS IR SOLN
Status: DC | PRN
  Administered 2020-10-14: 1000 mL

## 2020-10-14 MED ORDER — CHLORHEXIDINE GLUCONATE 4 % EX LIQD: 60.0000 mL | Freq: Once | CUTANEOUS | Status: DC

## 2020-10-14 MED ORDER — ALBUTEROL SULFATE (2.5 MG/3ML) 0.083% IN NEBU: 3.0000 mL | INHALATION_SOLUTION | Freq: Four times a day (QID) | RESPIRATORY_TRACT | Status: DC | PRN

## 2020-10-14 MED ORDER — LOSARTAN POTASSIUM-HCTZ 100-25 MG PO TABS: 1.0000 | ORAL_TABLET | Freq: Every day | ORAL | Status: DC

## 2020-10-14 MED ORDER — ACETAMINOPHEN 500 MG PO TABS
1000.0000 mg | ORAL_TABLET | ORAL | Status: AC
  Administered 2020-10-14: 1000 mg via ORAL
  Filled 2020-10-14: qty 2

## 2020-10-14 MED ORDER — PANTOPRAZOLE SODIUM 40 MG IV SOLR
40.0000 mg | Freq: Every day | INTRAVENOUS | Status: DC
  Administered 2020-10-14: 40 mg via INTRAVENOUS
  Filled 2020-10-14: qty 40

## 2020-10-14 MED ORDER — SUGAMMADEX SODIUM 500 MG/5ML IV SOLN
INTRAVENOUS | Status: DC | PRN
  Administered 2020-10-14: 350 mg via INTRAVENOUS

## 2020-10-14 MED ORDER — DEXAMETHASONE SODIUM PHOSPHATE 10 MG/ML IJ SOLN
INTRAMUSCULAR | Status: AC
  Filled 2020-10-14: qty 1

## 2020-10-14 MED ORDER — SCOPOLAMINE 1 MG/3DAYS TD PT72
1.0000 | MEDICATED_PATCH | TRANSDERMAL | Status: DC
  Administered 2020-10-14: 1.5 mg via TRANSDERMAL
  Filled 2020-10-14: qty 1

## 2020-10-14 MED ORDER — KCL IN DEXTROSE-NACL 20-5-0.45 MEQ/L-%-% IV SOLN
INTRAVENOUS | Status: DC
  Filled 2020-10-14 (×4): qty 1000

## 2020-10-14 MED ORDER — ONDANSETRON HCL 4 MG/2ML IJ SOLN
INTRAMUSCULAR | Status: DC | PRN
  Administered 2020-10-14: 4 mg via INTRAVENOUS

## 2020-10-14 MED ORDER — ROCURONIUM BROMIDE 10 MG/ML (PF) SYRINGE
PREFILLED_SYRINGE | INTRAVENOUS | Status: DC | PRN
  Administered 2020-10-14: 10 mg via INTRAVENOUS
  Administered 2020-10-14: 20 mg via INTRAVENOUS
  Administered 2020-10-14: 70 mg via INTRAVENOUS

## 2020-10-14 MED ORDER — ACETAMINOPHEN 500 MG PO TABS
1000.0000 mg | ORAL_TABLET | Freq: Three times a day (TID) | ORAL | Status: DC
  Administered 2020-10-14 – 2020-10-15 (×2): 1000 mg via ORAL
  Filled 2020-10-14 (×3): qty 2

## 2020-10-14 MED ORDER — FENTANYL CITRATE (PF) 100 MCG/2ML IJ SOLN
25.0000 ug | INTRAMUSCULAR | Status: DC | PRN
  Administered 2020-10-14: 25 ug via INTRAVENOUS
  Filled 2020-10-14: qty 2

## 2020-10-14 MED ORDER — DIPHENHYDRAMINE HCL 50 MG/ML IJ SOLN: 12.5000 mg | Freq: Three times a day (TID) | INTRAMUSCULAR | Status: DC | PRN

## 2020-10-14 MED ORDER — HYDRALAZINE HCL 20 MG/ML IJ SOLN: 10.0000 mg | INTRAMUSCULAR | Status: DC | PRN

## 2020-10-14 MED ORDER — ORAL CARE MOUTH RINSE: 15.0000 mL | Freq: Once | OROMUCOSAL | Status: AC

## 2020-10-14 MED ORDER — GABAPENTIN 100 MG PO CAPS
200.0000 mg | ORAL_CAPSULE | Freq: Two times a day (BID) | ORAL | Status: DC
  Administered 2020-10-14 – 2020-10-15 (×3): 200 mg via ORAL
  Filled 2020-10-14 (×3): qty 2

## 2020-10-14 MED ORDER — ONDANSETRON HCL 4 MG/2ML IJ SOLN
4.0000 mg | Freq: Four times a day (QID) | INTRAMUSCULAR | Status: DC | PRN
  Administered 2020-10-14 – 2020-10-15 (×2): 4 mg via INTRAVENOUS
  Filled 2020-10-14 (×2): qty 2

## 2020-10-14 MED ORDER — DEXAMETHASONE SODIUM PHOSPHATE 10 MG/ML IJ SOLN
INTRAMUSCULAR | Status: DC | PRN
  Administered 2020-10-14: 5 mg via INTRAVENOUS

## 2020-10-14 MED ORDER — INSULIN ASPART 100 UNIT/ML IJ SOLN
0.0000 [IU] | INTRAMUSCULAR | Status: DC
  Administered 2020-10-14: 7 [IU] via SUBCUTANEOUS
  Administered 2020-10-14 – 2020-10-15 (×2): 4 [IU] via SUBCUTANEOUS
  Administered 2020-10-15: 3 [IU] via SUBCUTANEOUS

## 2020-10-14 MED ORDER — SODIUM CHLORIDE 0.9% FLUSH
INTRAVENOUS | Status: DC | PRN
  Administered 2020-10-14: 50 mL

## 2020-10-14 MED ORDER — SODIUM CHLORIDE 0.9 % IV SOLN
2.0000 g | INTRAVENOUS | Status: AC
  Administered 2020-10-14: 2 g via INTRAVENOUS
  Filled 2020-10-14: qty 2

## 2020-10-14 MED ORDER — ACETAMINOPHEN 160 MG/5ML PO SOLN: 1000.0000 mg | Freq: Three times a day (TID) | ORAL | Status: DC

## 2020-10-14 MED ORDER — LIDOCAINE HCL 2 % IJ SOLN
INTRAMUSCULAR | Status: AC
  Filled 2020-10-14: qty 20

## 2020-10-14 MED ORDER — HYDROMORPHONE HCL 1 MG/ML IJ SOLN
INTRAMUSCULAR | Status: AC
  Administered 2020-10-14: 0.25 mg via INTRAVENOUS
  Filled 2020-10-14: qty 1

## 2020-10-14 MED ORDER — ENOXAPARIN SODIUM 30 MG/0.3ML IJ SOSY
30.0000 mg | PREFILLED_SYRINGE | Freq: Two times a day (BID) | INTRAMUSCULAR | Status: DC
  Administered 2020-10-15 (×2): 30 mg via SUBCUTANEOUS
  Filled 2020-10-14 (×2): qty 0.3

## 2020-10-14 MED ORDER — LOSARTAN POTASSIUM 50 MG PO TABS
100.0000 mg | ORAL_TABLET | Freq: Every day | ORAL | Status: DC
  Administered 2020-10-14 – 2020-10-15 (×2): 100 mg via ORAL
  Filled 2020-10-14 (×2): qty 2

## 2020-10-14 MED ORDER — MIDAZOLAM HCL 2 MG/2ML IJ SOLN
INTRAMUSCULAR | Status: AC
  Filled 2020-10-14: qty 2

## 2020-10-14 MED ORDER — HEPARIN SODIUM (PORCINE) 5000 UNIT/ML IJ SOLN
5000.0000 [IU] | INTRAMUSCULAR | Status: AC
  Administered 2020-10-14: 5000 [IU] via SUBCUTANEOUS
  Filled 2020-10-14: qty 1

## 2020-10-14 MED ORDER — MIDAZOLAM HCL 5 MG/5ML IJ SOLN
INTRAMUSCULAR | Status: DC | PRN
  Administered 2020-10-14: 2 mg via INTRAVENOUS

## 2020-10-14 MED ORDER — ENSURE MAX PROTEIN PO LIQD
2.0000 [oz_av] | ORAL | Status: DC
  Administered 2020-10-15 (×2): 2 [oz_av] via ORAL

## 2020-10-14 MED ORDER — ROCURONIUM BROMIDE 10 MG/ML (PF) SYRINGE
PREFILLED_SYRINGE | INTRAVENOUS | Status: AC
  Filled 2020-10-14: qty 10

## 2020-10-14 MED ORDER — PROPOFOL 10 MG/ML IV BOLUS
INTRAVENOUS | Status: DC | PRN
  Administered 2020-10-14: 200 mg via INTRAVENOUS

## 2020-10-14 MED ORDER — GABAPENTIN 300 MG PO CAPS
300.0000 mg | ORAL_CAPSULE | ORAL | Status: AC
  Administered 2020-10-14: 300 mg via ORAL
  Filled 2020-10-14: qty 1

## 2020-10-14 MED ORDER — DEXAMETHASONE SODIUM PHOSPHATE 4 MG/ML IJ SOLN: 4.0000 mg | INTRAMUSCULAR | Status: DC

## 2020-10-14 MED ORDER — SUGAMMADEX SODIUM 500 MG/5ML IV SOLN
INTRAVENOUS | Status: AC
  Filled 2020-10-14: qty 5

## 2020-10-14 SURGICAL SUPPLY — 85 items
ADH SKN CLS APL DERMABOND .7 (GAUZE/BANDAGES/DRESSINGS)
APL PRP STRL LF DISP 70% ISPRP (MISCELLANEOUS) ×2
APPLIER CLIP 5 13 M/L LIGAMAX5 (MISCELLANEOUS)
APPLIER CLIP ROT 10 11.4 M/L (STAPLE)
APPLIER CLIP ROT 13.4 12 LRG (CLIP) ×2
APR CLP LRG 13.4X12 ROT 20 MLT (CLIP) ×1
APR CLP MED LRG 11.4X10 (STAPLE)
APR CLP MED LRG 5 ANG JAW (MISCELLANEOUS)
BLADE SURG SZ11 CARB STEEL (BLADE) ×2 IMPLANT
CANNULA REDUC XI 12-8 STAPL (CANNULA) ×2
CANNULA REDUCER 12-8 DVNC XI (CANNULA) ×1 IMPLANT
CHLORAPREP W/TINT 26 (MISCELLANEOUS) ×4 IMPLANT
CLIP APPLIE 5 13 M/L LIGAMAX5 (MISCELLANEOUS) IMPLANT
CLIP APPLIE ROT 10 11.4 M/L (STAPLE) IMPLANT
CLIP APPLIE ROT 13.4 12 LRG (CLIP) IMPLANT
COVER MAYO STAND STRL (DRAPES) ×2 IMPLANT
COVER SURGICAL LIGHT HANDLE (MISCELLANEOUS) ×2 IMPLANT
COVER TIP SHEARS 8 DVNC (MISCELLANEOUS) IMPLANT
COVER TIP SHEARS 8MM DA VINCI (MISCELLANEOUS)
COVER WAND RF STERILE (DRAPES) ×2 IMPLANT
DECANTER SPIKE VIAL GLASS SM (MISCELLANEOUS) ×2 IMPLANT
DERMABOND ADVANCED (GAUZE/BANDAGES/DRESSINGS)
DERMABOND ADVANCED .7 DNX12 (GAUZE/BANDAGES/DRESSINGS) ×1 IMPLANT
DRAPE 3/4 80X56 (DRAPES) ×2 IMPLANT
DRAPE ARM DVNC X/XI (DISPOSABLE) ×3 IMPLANT
DRAPE COLUMN DVNC XI (DISPOSABLE) IMPLANT
DRAPE DA VINCI XI ARM (DISPOSABLE) ×6
DRAPE DA VINCI XI COLUMN (DISPOSABLE)
DRSG TEGADERM 2-3/8X2-3/4 SM (GAUZE/BANDAGES/DRESSINGS) ×10 IMPLANT
ELECT REM PT RETURN 15FT ADLT (MISCELLANEOUS) ×2 IMPLANT
ENDOLOOP SUT PDS II  0 18 (SUTURE)
ENDOLOOP SUT PDS II 0 18 (SUTURE) IMPLANT
GAUZE 4X4 16PLY RFD (DISPOSABLE) ×2 IMPLANT
GAUZE SPONGE 2X2 8PLY STRL LF (GAUZE/BANDAGES/DRESSINGS) ×1 IMPLANT
GLOVE SURG POLY ORTHO LF SZ7.5 (GLOVE) ×4 IMPLANT
GOWN STRL REUS W/TWL LRG LVL3 (GOWN DISPOSABLE) ×6 IMPLANT
GOWN STRL REUS W/TWL XL LVL3 (GOWN DISPOSABLE) ×4 IMPLANT
GRASPER SUT TROCAR 14GX15 (MISCELLANEOUS) ×2 IMPLANT
KIT BASIN OR (CUSTOM PROCEDURE TRAY) ×2 IMPLANT
MARKER SKIN DUAL TIP RULER LAB (MISCELLANEOUS) ×2 IMPLANT
MAT PREVALON FULL STRYKER (MISCELLANEOUS) ×2 IMPLANT
NDL INSUFFLATION 14GA 120MM (NEEDLE) IMPLANT
NDL SPNL 22GX3.5 QUINCKE BK (NEEDLE) ×1 IMPLANT
NEEDLE INSUFFLATION 14GA 120MM (NEEDLE) IMPLANT
NEEDLE SPNL 22GX3.5 QUINCKE BK (NEEDLE) ×2 IMPLANT
PACK CARDIOVASCULAR III (CUSTOM PROCEDURE TRAY) ×2 IMPLANT
RELOAD STAPLE 60 2.5 WHT DVNC (STAPLE) IMPLANT
RELOAD STAPLE 60 3.5 BLU DVNC (STAPLE) ×2 IMPLANT
RELOAD STAPLE 60 4.3 GRN DVNC (STAPLE) ×1 IMPLANT
RELOAD STAPLER 2.5X60 WHT DVNC (STAPLE) ×3 IMPLANT
RELOAD STAPLER 3.5X60 BLU DVNC (STAPLE) ×2 IMPLANT
RELOAD STAPLER 4.3X60 GRN DVNC (STAPLE) ×1 IMPLANT
SCISSORS LAP 5X35 DISP (ENDOMECHANICALS) IMPLANT
SEAL CANN UNIV 5-8 DVNC XI (MISCELLANEOUS) ×2 IMPLANT
SEAL XI 5MM-8MM UNIVERSAL (MISCELLANEOUS) ×4
SEALER VESSEL DA VINCI XI (MISCELLANEOUS) ×2
SEALER VESSEL EXT DVNC XI (MISCELLANEOUS) ×1 IMPLANT
SET IRRIG TUBING LAPAROSCOPIC (IRRIGATION / IRRIGATOR) ×2 IMPLANT
SLEEVE GASTRECTOMY 40FR VISIGI (MISCELLANEOUS) ×2 IMPLANT
SOL ANTI FOG 6CC (MISCELLANEOUS) ×1 IMPLANT
SOLUTION ANTI FOG 6CC (MISCELLANEOUS) ×1
SOLUTION ELECTROLUBE (MISCELLANEOUS) ×2 IMPLANT
SPONGE GAUZE 2X2 STER 10/PKG (GAUZE/BANDAGES/DRESSINGS) ×1
STAPLER 60 DA VINCI SURE FORM (STAPLE) ×2
STAPLER 60 SUREFORM DVNC (STAPLE) ×1 IMPLANT
STAPLER CANNULA SEAL DVNC XI (STAPLE) ×1 IMPLANT
STAPLER CANNULA SEAL XI (STAPLE) ×2
STAPLER RELOAD 2.5X60 WHITE (STAPLE) ×6
STAPLER RELOAD 2.5X60 WHT DVNC (STAPLE) ×3
STAPLER RELOAD 3.5X60 BLU DVNC (STAPLE) ×2
STAPLER RELOAD 3.5X60 BLUE (STAPLE) ×4
STAPLER RELOAD 4.3X60 GREEN (STAPLE) ×2
STAPLER RELOAD 4.3X60 GRN DVNC (STAPLE) ×1
STRIP CLOSURE SKIN 1/2X4 (GAUZE/BANDAGES/DRESSINGS) ×1 IMPLANT
SUT MNCRL AB 4-0 PS2 18 (SUTURE) ×2 IMPLANT
SUT VICRYL 0 TIES 12 18 (SUTURE) ×2 IMPLANT
SUT VLOC 180 2-0 9IN GS21 (SUTURE) IMPLANT
SYR 20ML LL LF (SYRINGE) ×2 IMPLANT
TAPE STRIPS DRAPE STRL (GAUZE/BANDAGES/DRESSINGS) ×2 IMPLANT
TOWEL OR 17X26 10 PK STRL BLUE (TOWEL DISPOSABLE) ×2 IMPLANT
TOWEL OR NON WOVEN STRL DISP B (DISPOSABLE) ×2 IMPLANT
TRAY FOLEY MTR SLVR 16FR STAT (SET/KITS/TRAYS/PACK) IMPLANT
TROCAR ADV FIXATION 5X100MM (TROCAR) IMPLANT
TROCAR BLADELESS OPT 5 100 (ENDOMECHANICALS) ×2 IMPLANT
TUBING INSUFFLATION 10FT LAP (TUBING) ×2 IMPLANT

## 2020-10-14 NOTE — Interval H&P Note (Signed)
History and Physical Interval Note:  10/14/2020 7:08 AM  Leah Hampton  has presented today for surgery, with the diagnosis of morbid obesity.  The various methods of treatment have been discussed with the patient and family. After consideration of risks, benefits and other options for treatment, the patient has consented to  Procedure(s): XI ROBOT ASSISTED GASTRIC SLEEVE RESECTION (N/A) UPPER GI ENDOSCOPY (N/A) as a surgical intervention.  The patient's history has been reviewed, patient examined, no change in status, stable for surgery.  I have reviewed the patient's chart and labs.  Questions were answered to the patient's satisfaction.    Leighton Ruff. Redmond Pulling, MD, FACS General, Bariatric, & Minimally Invasive Surgery [facility]  [name]

## 2020-10-14 NOTE — Progress Notes (Signed)
PHARMACY CONSULT FOR:  Risk Assessment for Post-Discharge VTE Following Bariatric Surgery  Post-Discharge VTE Risk Assessment: This patient's probability of 30-day post-discharge VTE is increased due to the factors marked:   Female  x  Age >/=60 years  x  BMI >/=50 kg/m2    CHF    Dyspnea at Rest    Paraplegia  x  Non-gastric-band surgery    Operation Time >/=3 hr    Return to OR     Length of Stay >/= 3 d   Hx of VTE   Hypercoagulable condition   Significant venous stasis    Predicted probability of 30-day post-discharge VTE: 0.52%  Recommendation for Discharge: Enoxaparin 60 mg [place] q12h x 2 weeks post-discharge  Leah Hampton is a 65 y.o. female who underwent  Sleeve gastrectomy on 5/31   Case start: 0759 Case end: 1009   Allergies  Allergen Reactions  . Sulfamethoxazole-Trimethoprim [Bactrim] Itching  . Codeine Nausea And Vomiting and Anxiety    Patient Measurements: Height: 5' 2" (157.5 cm) Weight: (!) 137 kg (302 lb 0.5 oz) IBW/kg (Calculated) : 50.1 Body mass index is 55.24 kg/m.  Recent Labs    10/14/20 1055  HGB 13.4  HCT 40.7   Estimated Creatinine Clearance: 78 mL/min (by C-G formula based on SCr of 0.99 mg/dL).    Past Medical History:  Diagnosis Date  . Bronchitis   . Chronic bronchitis ([place])   . Cystitis   . Diabetes type 2, controlled ([place])   . Hematuria    urologice eval, Dr Estill Dooms  . History of chest pain   . Hyperbilirubinemia   . Hyperlipemia   . Hypertelorism   . Hypertension   . Leukocytosis   . Obesity   . Osteoarthritis    right knee  . Sleep apnea 2012   slight   . Vaginal Pap smear, abnormal   . Vitamin D deficiency 02/09/2018     Medications Prior to Admission  Medication Sig Dispense Refill Last Dose  . FLOVENT HFA 110 MCG/ACT inhaler USE 2 INHALATIONS BY MOUTH  TWICE DAILY (Patient taking differently: Inhale 2 puffs into the lungs 2 (two) times daily.) 36 g 1 10/14/2020 at 0430  . losartan-hydrochlorothiazide  (HYZAAR) 100-25 MG tablet TAKE 1 TABLET BY MOUTH  DAILY 90 tablet 1 10/13/2020 at Unknown time  . Multiple Vitamin (MULTIVITAMIN WITH MINERALS) TABS tablet Take 1 tablet by mouth daily. Vitafusion   10/13/2020 at Unknown time  . omeprazole (PRILOSEC) 20 MG capsule Take 1 capsule (20 mg total) by mouth daily. 90 capsule 1 10/14/2020 at 0430  . pravastatin (PRAVACHOL) 80 MG tablet Take 1 tablet (80 mg total) by mouth daily. 90 tablet 1 10/13/2020 at Unknown time  . psyllium (METAMUCIL) 58.6 % powder Take 1 packet by mouth daily as needed (constipation).   Past Month at Unknown time  . Turmeric 500 MG CAPS Take 500 mg by mouth daily.   10/13/2020 at Unknown time  . valACYclovir (VALTREX) 1000 MG tablet TAKE 1 TABLET BY MOUTH  DAILY (Patient taking differently: Take 1,000 mg by mouth daily.) 90 tablet 3 10/14/2020 at 0430  . albuterol (VENTOLIN HFA) 108 (90 Base) MCG/ACT inhaler USE 2 INHALATIONS BY MOUTH  EVERY 6 HOURS AS NEEDED FOR WHEEZING OR SHORTNESS OF  BREATH (Patient taking differently: Inhale 2 puffs into the lungs every 6 (six) hours as needed for wheezing (Cough).) 34 g 3 More than a month at Unknown time  . blood glucose meter kit and supplies  KIT Dispense based on patient and insurance preference. Use up to four times daily as directed. (FOR ICD-9 250.00, 250.01). 1 each 0 supply  . Ciclopirox 0.77 % gel Apply 1 application topically 2 (two) times daily. (Patient not taking: No sig reported) 45 g 0 Not Taking at Unknown time  . COVID-19 mRNA vaccine, Pfizer, 30 MCG/0.3ML injection INJECT AS DIRECTED (Patient not taking: Reported on 09/24/2020) .3 mL 0 Not Taking at Unknown time  . diclofenac Sodium (VOLTAREN) 1 % GEL Apply 2 g topically 4 (four) times daily. 100 g 1 Unknown at Unknown time  . ibuprofen (ADVIL) 600 MG tablet Take 1 tablet (600 mg total) by mouth every 6 (six) hours as needed. (Patient not taking: Reported on 09/24/2020) 30 tablet 0 Not Taking at Unknown time  . polyethylene glycol  (MIRALAX / GLYCOLAX) 17 g packet Take 17 g by mouth daily as needed for moderate constipation or severe constipation.     . potassium chloride SA (KLOR-CON) 20 MEQ tablet TAKE 1 TABLET BY MOUTH  DAILY (Patient taking differently: Take 20 mEq by mouth daily. TAKE 1 TABLET BY MOUTH  DAILY) 90 tablet 1 More than a month at Unknown time  . triamcinolone cream (KENALOG) 0.1 % Apply 1 application topically 2 (two) times daily. 30 g 0 Unknown at Unknown time     Lenis Noon, PharmD 10/14/2020,11:41 AM

## 2020-10-14 NOTE — Op Note (Signed)
10/14/2020 Leah Hampton 1955-12-20 [id number]   PRE-OPERATIVE DIAGNOSIS:     Severe obesity (BMI 52)   Hyperlipidemia   Essential hypertension   BACK PAIN, LUMBAR   OSA (obstructive sleep apnea)   Osteoarthritis of right knee   Diabetes type 2, controlled ([place]) Incarcerated Supraumbilical hernia    POST-OPERATIVE DIAGNOSIS:  same  PROCEDURE:  Procedure(s): Xi robotic SLEEVE GASTRECTOMY  UPPER GI ENDOSCOPY Laparoscopic bilateral tap block  SURGEON:  Surgeon(s): Gayland Curry, MD FACS FASMBS  ASSISTANTS: Johnathan Hausen MD FACS  ANESTHESIA:   general  DRAINS: none   BOUGIE: 40 fr ViSiGi  LOCAL MEDICATIONS USED:   Exparel  EBL: minimal  SPECIMEN:  Source of Specimen:  Greater curvature of stomach  DISPOSITION OF SPECIMEN:  PATHOLOGY  COUNTS:  YES  INDICATION FOR PROCEDURE: This is a very pleasant 65 y.o.-year-old morbidly obese female who has had unsuccessful attempts for sustained weight loss. The patient presents today for a planned laparoscopic sleeve gastrectomy with upper endoscopy. We have discussed the risk and benefits of the procedure extensively preoperatively. Please see my separate notes.  PROCEDURE: After obtaining informed consent and receiving 5000 units of subcutaneous heparin, the patient was brought to the operating room at [facility] and placed supine on the operating room table. General endotracheal anesthesia was established. Sequential compression devices were placed. A orogastric tube was placed. The patient's abdomen was prepped and draped in the usual standard surgical fashion. The patient received preoperative IV antibiotic. A surgical timeout was performed. ERAS protocol used.   Patient had a pre-existing chronically incarcerated supraumbilical hernia.  I measured from the xiphoid down to about 20 cm just to the left of the umbilicus and made a mark there with a marking pen.  Then I measured about 8 cm lateral to that and marked out  with a marking pen and then measured another 8 cm lateral to that so that it was more in the left axillary line and marked out with a marking pen.  Access to the abdomen was achieved using a 5 mm 0 laparoscope thru a 5 mm trocar In the mid clavicular line in the left midabdomen about 9cm to the left & slight above the level of the umbilicus using the Optiview technique where I made the second. Pneumoperitoneum was smoothly established up to 15 mm of mercury. The laparoscope was advanced and the abdominal cavity was surveilled.  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  It was hard to tell if there is any colonic involvement. the patient was then placed in reverse Trendelenburg.  A tap block was performed on patient's right side under direct visualization.  Tap block was performed on the left side under direct visualization.   The tap block was performed for postoperative pain relief. The [facility] liver retractor was placed under the left lobe of the liver through a 5 mm trocar incision site in the subxiphoid position.  I went ahead and placed an additional 8 mm robotic trocar in the left lateral abdominal wall position.  I then switched the camera to that trocar and then went about confirming the location for the 8 mm trocar camera port just to the left of the upper midline hernia.  The trocar was placed just to the patient's left of the incarcerated hernia.  I then went about placing a assistant 5 mm trocar in the right lateral abdominal wall under direct visualization.  We then switched the camera to that trocar site and placed  a robotic 12 mm trocar just lateral to the ventral hernia for the stapler trocar.    The XI robot was then docked and targeted for upper abdominal anatomy.  We did have to reposition the liver retractor a few times which took about 20 minutes to do because the camera arm was going to be colliding with the [facility] liver retractor.  We ultimately  placed a [place] liver retractor bed attachment on the left side of the bed.  By changing this location I was able to attach the camera arm to the trocar without any collision with the [facility] liver retractor. Arm 2 was attached to the left mid abdominal trocar and the camera was inserted.  Anatomy was targeted.  Arms 1, 3 and 4 were then attached to the robotic trochars.  A fenestrated bipolar was placed in arm 1.  A vessel sealer was placed in arm 3 and a tip up grasper was placed in arm 4.  My assistant stayed at the bedside while I went to the robotic console.  The stomach was inspected. It was completely decompressed and the orogastric tube was removed.  There is no evidence of injury to surrounding viscera.  There is no evidence of a hiatal hernia.  Her preoperative upper GI showed no hiatal hernia.   We identified the pylorus and measured 6 cm proximal to the pylorus and identified an area of where we would start taking down the short gastric vessels.  The vessel sealer was used to take down the short gastric vessels along the greater curvature of the stomach. We were able to enter the lesser sac. We continued to march along the greater curvature of the stomach taking down the short gastrics.  My assistant provided traction laterally to patient's right side.  as we approached the gastrosplenic ligament we took care in this area not to injure the spleen. We were able to take down the entire gastrosplenic ligament. We then mobilized the fundus away from the left crus of diaphragm. There were not any significant posterior gastric avascular attachments. This left the stomach completely mobilized. No vessels had been taken down along the lesser curvature of the stomach.   We then reidentified the pylorus. A 40Fr ViSiGi was then placed in the oropharynx and advanced down into the stomach and placed in the distal antrum and positioned along the lesser curvature. It was placed under suction which secured  the 40Fr ViSiGi in place along the lesser curve. Then using the robotic 60 mm stapler with a green load, I placed a stapler along the antrum approximately 6cm from the pylorus. The stapler was angled so that there is ample room at the angularis incisura. I then fired the first staple load after inspecting it posteriorly to ensure adequate space both anteriorly and posteriorly.  The stapler did not have to pause for compression so at this point I started using 60 mm blue load staple cartridges. The robotic stapler was then repositioned with a 60 mm blue load  and we continued to march up along the [place].  Prior to each firing of the staple, we rotated the stomach to ensure that there is adequate stomach left.  As we approached the fundus, I used 60 mm white cartridge aiming  lateral to the GE junction after mobilizing some of the esophageal fat pad.  The sleeve was inspected. There is no evidence of cork screw. The staple line appeared hemostatic except along the first portion of the staple line at the  antrum.  The assistant placed 3 hemoclips at this point there was excellent hemostasis. The CRNA inflated the ViSiGi to the green zone and the upper abdomen was flooded with saline. There were no bubbles. The sleeve was decompressed and the ViSiGi removed.I performed an upper endoscopy.  The endoscope was placed in the patient's oropharynx and gently glided down.  Z-line at approximately 38 cm.  The endoscope was advanced into the gastric sleeve and insufflated.  I was able to easily advance the endoscope down into the antrum.  Pylorus was visible.  There is no stricture at the incisura.  There was no corkscrewing.  There is no evidence of internal bleeding.  The sleeve was decompressed and the endoscope was removed.   The greater curvature the stomach was grasped with a laparoscopic grasper.  At this point I scrubbed back in.  The robot was undocked.  We removed the specimen from the 12 mm trocar site.  The liver  retractor was removed.  I did not close the extraction site since the trocar had been placed at tangential approach and it was next to the incarcerated hernia.   Remaining Exparel was then infiltrated in the preperitoneal spaces around the trocar sites.  The hernia was reinspected and there is no evidence of injury to the herniated contents.  Sleeve staple line was reinspected and was hemostatic.  Pneumoperitoneum was released. All trocar sites were closed with a 4-0 Monocryl in a subcuticular fashion followed by the application steri-strips and tegaderm. The patient was extubated and taken to the recovery room in stable condition. All needle, instrument, and sponge counts were correct x2. There are no immediate complications  (1) 60 mm green  (2) 60 mm blue  (3) 60 mm white  PLAN OF CARE: Admit to inpatient   PATIENT DISPOSITION:  PACU - hemodynamically stable.   Delay start of Pharmacological VTE agent (>24hrs) due to surgical blood loss or risk of bleeding:  no  Leighton Ruff. Redmond Pulling, MD, FACS FASMBS General, Bariatric, & Minimally Invasive Surgery [facility], [place]

## 2020-10-14 NOTE — Anesthesia Postprocedure Evaluation (Signed)
Anesthesia Post Note  Patient: Leah Hampton  Procedure(s) Performed: XI ROBOT ASSISTED GASTRIC SLEEVE RESECTION (N/A ) UPPER GI ENDOSCOPY (N/A )     Patient location during evaluation: PACU Anesthesia Type: General Level of consciousness: sedated and patient cooperative Pain management: pain level controlled Vital Signs Assessment: post-procedure vital signs reviewed and stable Respiratory status: spontaneous breathing Cardiovascular status: stable Anesthetic complications: no   No complications documented.  Last Vitals:  Vitals:   10/14/20 1256 10/14/20 1428  BP: (!) 175/82 (!) 164/81  Pulse: 91 89  Resp: 18 18  Temp: 36.7 C 36.8 C  SpO2: 100% 100%    Last Pain:  Vitals:   10/14/20 1212  TempSrc:   PainSc: [place]

## 2020-10-14 NOTE — Progress Notes (Signed)

## 2020-10-14 NOTE — Anesthesia Procedure Notes (Signed)
Procedure Name: Intubation Date/Time: 10/14/2020 7:31 AM Performed by: Victoriano Lain, CRNA Pre-anesthesia Checklist: Patient identified, Emergency Drugs available, Suction available, Patient being monitored and Timeout performed Patient Re-evaluated:Patient Re-evaluated prior to induction Oxygen Delivery Method: Circle system utilized Preoxygenation: Pre-oxygenation with 100% oxygen Induction Type: IV induction Ventilation: Mask ventilation without difficulty Laryngoscope Size: Mac and 4 Grade View: Grade I Tube type: Oral Tube size: 7.5 mm Number of attempts: 1 Airway Equipment and Method: Stylet Placement Confirmation: ETT inserted through vocal cords under direct vision,  positive ETCO2 and breath sounds checked- equal and bilateral Secured at: 21 cm Tube secured with: Tape Dental Injury: Teeth and Oropharynx as per pre-operative assessment

## 2020-10-14 NOTE — Transfer of Care (Signed)
Immediate Anesthesia Transfer of Care Note  Patient: Leah Hampton  Procedure(s) Performed: Henreitta Cea ASSISTED GASTRIC SLEEVE RESECTION (N/A ) UPPER GI ENDOSCOPY (N/A )  Patient Location: PACU  Anesthesia Type:General  Level of Consciousness: awake, alert , oriented and patient cooperative  Airway & Oxygen Therapy: Patient Spontanous Breathing and Patient connected to face mask oxygen  Post-op Assessment: Report given to RN, Post -op Vital signs reviewed and stable and Patient moving all extremities  Post vital signs: Reviewed and stable  Last Vitals:  Vitals Value Taken Time  BP 184/81 10/14/20 1016  Temp    Pulse 95 10/14/20 1019  Resp 15 10/14/20 1019  SpO2 100 % 10/14/20 1019  Vitals shown include unvalidated device data.  Last Pain:  Vitals:   10/14/20 0549  TempSrc:   PainSc: 0-No pain         Complications: No complications documented.

## 2020-10-15 ENCOUNTER — Encounter (HOSPITAL_COMMUNITY): Payer: Self-pay | Admitting: General Surgery

## 2020-10-15 ENCOUNTER — Ambulatory Visit: Payer: Medicare Other | Admitting: Family Medicine

## 2020-10-15 ENCOUNTER — Ambulatory Visit: Payer: Medicare Other | Admitting: Family

## 2020-10-15 ENCOUNTER — Other Ambulatory Visit: Payer: Self-pay

## 2020-10-15 LAB — COMPREHENSIVE METABOLIC PANEL
ALT: 38 U/L (ref 0–44)
AST: 37 U/L (ref 15–41)
Albumin: 3.5 g/dL (ref 3.5–5.0)
Alkaline Phosphatase: 53 U/L (ref 38–126)
Anion gap: 6 (ref 5–15)
BUN: 12 mg/dL (ref 8–23)
CO2: 30 mmol/L (ref 22–32)
Calcium: 9 mg/dL (ref 8.9–10.3)
Chloride: 100 mmol/L (ref 98–111)
Creatinine, Ser: 0.83 mg/dL (ref 0.44–1.00)
GFR, Estimated: >60 mL/min (ref >60)
Glucose, Bld: 149 mg/dL — ABNORMAL HIGH (ref 70–99)
Potassium: 3.7 mmol/L (ref 3.5–5.1)
Sodium: 136 mmol/L (ref 135–145)
Total Bilirubin: 0.1 mg/dL — ABNORMAL LOW (ref 0.3–1.2)
Total Protein: 7.3 g/dL (ref 6.5–8.1)

## 2020-10-15 LAB — CBC WITH DIFFERENTIAL/PLATELET
Abs Immature Granulocytes: 0.09 10*3/uL — ABNORMAL HIGH (ref 0.00–0.07)
Basophils Absolute: 0 10*3/uL (ref 0.0–0.1)
Basophils Relative: 0 %
Eosinophils Absolute: 0 10*3/uL (ref 0.0–0.5)
Eosinophils Relative: 0 %
HCT: 36.4 % (ref 36.0–46.0)
Hemoglobin: 11.8 g/dL — ABNORMAL LOW (ref 12.0–15.0)
Immature Granulocytes: 1 %
Lymphocytes Relative: 17 %
Lymphs Abs: 2 10*3/uL (ref 0.7–4.0)
MCH: 29 pg (ref 26.0–34.0)
MCHC: 32.4 g/dL (ref 30.0–36.0)
MCV: 89.4 fL (ref 80.0–100.0)
Monocytes Absolute: 0.8 10*3/uL (ref 0.1–1.0)
Monocytes Relative: 6 %
Neutro Abs: 9.2 10*3/uL — ABNORMAL HIGH (ref 1.7–7.7)
Neutrophils Relative %: 76 %
Platelets: 282 10*3/uL (ref 150–400)
RBC: 4.07 MIL/uL (ref 3.87–5.11)
RDW: 16.4 % — ABNORMAL HIGH (ref 11.5–15.5)
WBC: 12.1 10*3/uL — ABNORMAL HIGH (ref 4.0–10.5)
nRBC: 0 % (ref 0.0–0.2)

## 2020-10-15 LAB — GLUCOSE, CAPILLARY
Glucose-Capillary: 111 mg/dL — ABNORMAL HIGH (ref 70–99)
Glucose-Capillary: 139 mg/dL — ABNORMAL HIGH (ref 70–99)
Glucose-Capillary: 149 mg/dL — ABNORMAL HIGH (ref 70–99)
Glucose-Capillary: 80 mg/dL (ref 70–99)
Glucose-Capillary: 83 mg/dL (ref 70–99)

## 2020-10-15 MED ORDER — ONDANSETRON 4 MG PO TBDP: 4.0000 mg | ORAL_TABLET | Freq: Four times a day (QID) | ORAL | 0 refills | Status: DC | PRN

## 2020-10-15 MED ORDER — ENOXAPARIN SODIUM 60 MG/0.6ML IJ SOSY: 60.0000 mg | PREFILLED_SYRINGE | Freq: Two times a day (BID) | INTRAMUSCULAR | 0 refills | Status: DC

## 2020-10-15 MED ORDER — TRAMADOL HCL 50 MG PO TABS: 50.0000 mg | ORAL_TABLET | Freq: Four times a day (QID) | ORAL | 0 refills | Status: DC | PRN

## 2020-10-15 MED ORDER — ACETAMINOPHEN 500 MG PO TABS: 1000.0000 mg | ORAL_TABLET | Freq: Three times a day (TID) | ORAL | 0 refills | Status: AC

## 2020-10-15 NOTE — Progress Notes (Signed)
24hr fluid recall: 514mL. Per dehydration protocol, will call pt to f/u within one week post op.  Pt was able to self demonstration Lovenox injection prior to discharge.  Pt to start discharge prescription tomorrow morning for 14 days.  Will f/u per protocol and as needed.

## 2020-10-15 NOTE — Progress Notes (Signed)
Nutrition Education Note ° °Received consult for diet education for patient s/p bariatric surgery. ° °Discussed 2 week post op diet with pt. Emphasized that liquids must be non carbonated, non caffeinated, and sugar free. Fluid goals discussed. Pt to follow up with outpatient bariatric RD for further diet progression after 2 weeks. Multivitamins and minerals also reviewed. Teach back method used, pt expressed understanding, expect good compliance. ° °If nutrition issues arise, please consult RD. ° °Leah Sean, MS, RD, LDN °Inpatient Clinical Dietitian °Contact information available via Amion ° ° °

## 2020-10-15 NOTE — Progress Notes (Signed)
OT Cancellation Note  Patient Details Name: NASIRAH SACHS MRN: [id number] DOB: [birth year]   Cancelled Treatment:    Reason Eval/Treat Not Completed: OT screened, no needs identified, will sign off. Patient seen by PT ambulated at mod I level. Patient reports no difficulty with self care tasks and was able to use bathroom with PT. No OT needs at this time.   Delbert Phenix OT OT pager: [phone]   Rosemary Holms 10/15/2020, 12:27 PM

## 2020-10-15 NOTE — Discharge Instructions (Signed)
GASTRIC BYPASS / SLEEVE  Home Care Instructions  These instructions are to help you care for yourself when you go home.  Call: If you have any problems. . Call [phone] and ask for the surgeon on call . If you have an emergency related to your surgery please use the ER at [facility].  . Tell the ER staff that you are a new post-op gastric bypass or gastric sleeve patient   Signs and symptoms to report: . Severe vomiting or nausea o If you cannot handle clear liquids for longer than 1 day, call your surgeon  . Abdominal pain which does not get better after taking your pain medication . Fever greater than 100.4 F and chills . Heart rate over 100 beats a minute . Trouble breathing . Chest pain .  Redness, swelling, drainage, or foul odor at incision (surgical) sites .  If your incisions open or pull apart . Swelling or pain in calf (lower leg) . Diarrhea (Loose bowel movements that happen often), frequent watery, uncontrolled bowel movements . Constipation, (no bowel movements for 3 days) if this happens:  o Take Milk of Magnesia, 2 tablespoons by mouth, 3 times a day for 2 days if needed o Stop taking Milk of Magnesia once you have had a bowel movement o Call your doctor if constipation continues Or o Take Miralax  (instead of Milk of Magnesia) following the label instructions o Stop taking Miralax once you have had a bowel movement o Call your doctor if constipation continues . Anything you think is "abnormal for you"   Normal side effects after surgery: . Unable to sleep at night or unable to concentrate . Irritability . Being tearful (crying) or depressed These are common complaints, possibly related to your anesthesia, stress of surgery and change in lifestyle, that usually go away a few weeks after surgery.  If these feelings continue, call your medical doctor.  Wound Care: You may have surgical glue, steri-strips, or staples over your incisions after surgery . Surgical  glue:  Looks like a clear film over your incisions and will wear off a little at a time . Steri-strips : Adhesive strips of tape over your incisions. You may notice a yellowish color on the skin under the steri-strips. This is used to make the   steri-strips stick better. Do not pull the steri-strips off - let them fall off . Staples: Staples may be removed before you leave the hospital o If you go home with staples, call [facility] at for an appointment with your surgeon's nurse to have staples removed 10 days after surgery, [phone] . Showering: You may shower two (2) days after your surgery unless your surgeon tells you differently o Wash gently around incisions with warm soapy water, rinse well, and gently pat dry  o If you have a drain (tube from your incision), you may need someone to hold this while you shower  o No tub baths until staples are removed and incisions are healed     Medications: . Medications should be liquid or crushed if larger than the size of a dime . Extended release pills (medication that releases a little bit at a time through the day) should not be crushed . Depending on the size and number of medications you take, you may need to space (take a few throughout the day)/change the time you take your medications so that you do not over-fill your pouch (smaller stomach) . Make sure you follow-up with   your primary care physician to make medication changes needed during rapid weight loss and life-style changes . If you have diabetes, follow up with the doctor that orders your diabetes medication(s) within one week after surgery and check your blood sugar regularly. . Do not drive while taking narcotics (pain medications) . DO NOT take NSAID'S (Examples of NSAID's include ibuprofen, naproxen)  Diet:                    First 2 Weeks  You will see the nutritionist about two (2) weeks after your surgery. The nutritionist will increase the types of foods you can  eat if you are handling liquids well: . If you have severe vomiting or nausea and cannot handle clear liquids lasting longer than 1 day, call your surgeon  Protein Shake . Drink at least 2 ounces of shake 5-6 times per day . Each serving of protein shakes (usually 8 - 12 ounces) should have a minimum of:  o 15 grams of protein  o And no more than 5 grams of carbohydrate  . Goal for protein each day: o Men = 80 grams per day o Women = 60 grams per day . Protein powder may be added to fluids such as non-fat milk or Lactaid milk or Soy milk (limit to 35 grams added protein powder per serving)  Hydration . Slowly increase the amount of water and other clear liquids as tolerated (See Acceptable Fluids) . Slowly increase the amount of protein shake as tolerated  .  Sip fluids slowly and throughout the day . May use sugar substitutes in small amounts (no more than 6 - 8 packets per day; i.e. Splenda)  Fluid Goal . The first goal is to drink at least 8 ounces of protein shake/drink per day (or as directed by the nutritionist);  See handout from pre-op Bariatric Education Class for examples of protein shake/drink.   o Slowly increase the amount of protein shake you drink as tolerated o You may find it easier to slowly sip shakes throughout the day o It is important to get your proteins in first . Your fluid goal is to drink 64 - 100 ounces of fluid daily o It may take a few weeks to build up to this . 32 oz (or more) should be clear liquids  And  . 32 oz (or more) should be full liquids (see below for examples) . Liquids should not contain sugar, caffeine, or carbonation  Clear Liquids: . Water or Sugar-free flavored water (i.e. Fruit H2O, Propel) . Decaffeinated coffee or tea (sugar-free) . Diontae Route Lite, Wyler's Lite, Minute Maid Lite . Sugar-free Jell-O . Bouillon or broth . Sugar-free Popsicle:   *Less than 20 calories each; Limit 1 per day  Full Liquids: Protein Shakes/Drinks + 2  choices per day of other full liquids . Full liquids must be: o No More Than 12 grams of Carbs per serving  o No More Than 3 grams of Fat per serving . Strained low-fat cream soup . Non-Fat milk . Fat-free Lactaid Milk . Sugar-free yogurt (Dannon Lite & Fit, Greek yogurt)      Vitamins and Minerals . Start 1 day after surgery unless otherwise directed by your surgeon . Bariatric Specific Complete Multivitamins . Chewable Calcium Citrate with Vitamin D-3 (Example: 3 Chewable Calcium Plus 600 with Vitamin D-3) o Take 500 mg three (3) times a day for a total of 1500 mg each day o Do not take all 3 doses   of calcium at one time as it may cause constipation, and you can only absorb 500 mg  at a time  o Do not mix multivitamins containing iron with calcium supplements; take 2 hours apart  . Menstruating women and those at risk for anemia (a blood disease that causes weakness) may need extra iron o Talk with your doctor to see if you need more iron . If you need extra iron: Total daily Iron recommendation (including Vitamins) is 50 to 100 mg Iron/day . Do not stop taking or change any vitamins or minerals until you talk to your nutritionist or surgeon . Your nutritionist and/or surgeon must approve all vitamin and mineral supplements   Activity and Exercise: It is important to continue walking at home.  Limit your physical activity as instructed by your doctor.  During this time, use these guidelines: . Do not lift anything greater than ten (10) pounds for at least two (2) weeks . Do not go back to work or drive until your surgeon says you can . You may have sex when you feel comfortable  o It is VERY important for female patients to use a reliable birth control method; fertility often increases after surgery  o Do not get pregnant for at least 18 months . Start exercising as soon as your doctor tells you that you can o Make sure your doctor approves any physical activity . Start with a  simple walking program . Walk 5-15 minutes each day, 7 days per week.  . Slowly increase until you are walking 30-45 minutes per day Consider joining our BELT program. [phone] or email [email or web address]   Special Instructions Things to remember:  . Use your CPAP when sleeping if this applies to you, do not stop the use of CPAP unless directed by physician after a sleep study . [facility] has a free Bariatric Surgery Support Group that meets monthly, the 3rd Thursday, 6 pm.  Please review discharge information for date and location of this meeting. . It is very important to keep all follow up appointments with your surgeon, nutritionist, primary care physician, and behavioral health practitioner o After the first year, please follow up with your bariatric surgeon and nutritionist at least once a year in order to maintain best weight loss results   [facility]: [phone] [facility]: [phone] Bariatric Nurse Coordinator: [phone]      

## 2020-10-15 NOTE — Evaluation (Signed)
Physical Therapy Evaluation Patient Details Name: Leah Hampton MRN: [id number] DOB: Sep 24, 1955 Today's Date: 10/15/2020   History of Present Illness  Patient is 65 y.o. female s/p gastric sleeve resection on 10/14/20 with PMH significant for obesity, HTN, HLD, DM, OA, Lt TKA in 2010.    Clinical Impression  Leah Hampton is 65 y.o. female admitted with above HPI and diagnosis. Patient is currently limited by functional impairments below (see PT problem list). Patient lives alone and is independent with [facility] for mobility and ADL's at baseline. She plans to discharge to son's home for assist. Patient evaluated by Physical Therapy with no further acute PT needs identified. All education has been completed and the patient has no further questions. Patient is currently at Supervision to [place] level with RW ar home. See below for any follow-up Physical Therapy or equipment needs. PT is signing off. Thank you for this referral.     Follow Up Recommendations No PT follow up    Equipment Recommendations  None recommended by PT    Recommendations for Other Services       Precautions / Restrictions Precautions Precautions: None Restrictions Weight Bearing Restrictions: No      Mobility  Bed Mobility               General bed mobility comments: pt OOB in recliner    Transfers Overall transfer level: Needs assistance Equipment used: Rolling walker (2 wheeled) Transfers: Sit to/from Stand;Stand Pivot Transfers Sit to Stand: Modified independent (Device/Increase time) Stand pivot transfers: Modified independent (Device/Increase time)       General transfer comment: Steady with rise from recliner and stand pivot from toilet.  Ambulation/Gait Ambulation/Gait assistance: Supervision Gait Distance (Feet): 360 Feet Assistive device: Rolling walker (2 wheeled) Gait Pattern/deviations: Step-through pattern;Decreased stride length;Wide base of support Gait velocity:  decr   General Gait Details: pt with wide BOS due to hip width/body. pt ambulated length of hallway with RW, supervision for safety/assist for IV pole. pt with heavy use of RW for balance and to reduce Rt knee pain. No overt LOB noted throughout.  Stairs            Wheelchair Mobility    Modified Rankin (Stroke Patients Only)       Balance Overall balance assessment: Mild deficits observed, not formally tested (pt completed all toileting Mod I/Indepednently)                                           Pertinent Vitals/Pain Pain Assessment: No/denies pain    Home Living Family/patient expects to be discharged to:: Private residence Living Arrangements: Children Available Help at Discharge: Family Type of Home: House Home Access: Level entry     Home Layout: One level Home Equipment: Environmental consultant - 4 wheels;Cane - single point;Bedside commode;Shower seat Additional Comments: pt plans to discharge to her son's home and have assist from him, girlfriend, and granddaughter. no steps at sons home, pt has bed/bath upstairs at her own home.    Prior Function Level of Independence: Independent;Independent with assistive device(s)         Comments: pt using SPC for mobility and occasionally rollator. she does not drive but does her own grocery shopping using cart. pt is independent with ADL's/IADL's.     Hand Dominance   Dominant Hand: Right    Extremity/Trunk Assessment   Upper Extremity Assessment  Upper Extremity Assessment: Overall WFL for tasks assessed    Lower Extremity Assessment Lower Extremity Assessment: Overall WFL for tasks assessed    Cervical / Trunk Assessment Cervical / Trunk Assessment: Other exceptions  Communication   Communication: No difficulties  Cognition Arousal/Alertness: Awake/alert Behavior During Therapy: WFL for tasks assessed/performed Overall Cognitive Status: Within Functional Limits for tasks assessed                                         General Comments      Exercises     Assessment/Plan    PT Assessment Patent does not need any further PT services  PT Problem List         PT Treatment Interventions      PT Goals (Current goals can be found in the Care Plan section)  Acute Rehab PT Goals Patient Stated Goal: get home and back to independence PT Goal Formulation: All assessment and education complete, DC therapy Time For Goal Achievement: 10/15/20 Potential to Achieve Goals: Good    Frequency     Barriers to discharge        Co-evaluation               AM-PAC PT "6 Clicks" Mobility  Outcome Measure Help needed turning from your back to your side while in a flat bed without using bedrails?: A Little Help needed moving from lying on your back to sitting on the side of a flat bed without using bedrails?: A Little Help needed moving to and from a bed to a chair (including a wheelchair)?: None Help needed standing up from a chair using your arms (e.g., wheelchair or bedside chair)?: None Help needed to walk in hospital room?: A Little Help needed climbing 3-5 steps with a railing? : A Little 6 Click Score: 20    End of Session   Activity Tolerance: Patient tolerated treatment well Patient left: in chair;with call bell/phone within reach Nurse Communication: Mobility status PT Visit Diagnosis: Difficulty in walking, not elsewhere classified (R26.2)    Time: 5465-6812 PT Time Calculation (min) (ACUTE ONLY): 19 min   Charges:   PT Evaluation $PT Eval Low Complexity: 1 Low          Verner Mould, DPT Acute Rehabilitation Services Office [phone] Pager [phone]    Jacques Navy 10/15/2020, 12:27 PM

## 2020-10-15 NOTE — Progress Notes (Addendum)
Patient alert and oriented, pain is controlled. Patient is tolerating fluids, advanced to protein shake today, patient is tolerating well.  Reviewed Gastric sleeve discharge instructions with patient and patient is able to articulate understanding.  Provided information on BELT program, Support Group and [facility] outpatient pharmacy.   Patient working on meeting protein goals for today.  Lovenox teaching kit given to patient. Patient is watching Lovenox self injection video for self administration at home after discharge. All questions answered, will continue to monitor.

## 2020-10-16 ENCOUNTER — Telehealth (HOSPITAL_COMMUNITY): Payer: Self-pay | Admitting: *Deleted

## 2020-10-16 NOTE — Discharge Summary (Signed)
Physician Discharge Summary  Leah Hampton UEK:[id number] DOB: 1955/06/01 DOA: 10/14/2020  PCP: Debbrah Alar, NP  Admit date: 10/14/2020 Discharge date: 10/15/2020  Recommendations for Outpatient Follow-up:    Follow-up Information    Leah Pickerel, MD. Go on 11/12/2020.   Specialty: General Surgery Why: at 11:00am.  Please arrive 15 minutes prior to your appointment time.  Thank you. Contact information: [address] [place]  [address] [phone]        Leah Hurl, PA-C. Go on 12/02/2020.   Specialty: General Surgery Why: at 2:15pm for Dr. Redmond Pulling.  Please arrive 15 minutes prior to your appointment time.  Thank you. Contact information: [place] [address] [phone]              Discharge Diagnoses:  Principal Problem:   Severe obesity (BMI >= 40) (HCC) Active Problems:   Hyperlipidemia   Essential hypertension   BACK PAIN, LUMBAR   OSA (obstructive sleep apnea)   Osteoarthritis of right knee   Diabetes type 2, controlled ([place]) incarcerated supraumbilical hernia  Surgical Procedure: Robotic Sleeve Gastrectomy, upper endoscopy  Discharge Condition: Good Disposition: Home  Diet recommendation: Postoperative sleeve gastrectomy diet (liquids only)  Filed Weights   10/14/20 0539  Weight: (!) 137 kg     Hospital Course:  The patient was admitted for a planned robotic sleeve gastrectomy. Please see operative note. Preoperatively the patient was given 5000 units of subcutaneous heparin for DVT prophylaxis. Postoperative prophylactic Lovenox dosing was started on the evening of postoperative day 0. ERAS protocol was used. On the evening of postoperative day 0, the patient was started on water and ice chips. On postoperative day 1 the patient had no fever or tachycardia and was tolerating water in their diet was gradually advanced throughout the day. The patient was ambulating without difficulty. Their vital signs  are stable without fever or tachycardia. Their hemoglobin had remained stable.The patient had received discharge instructions and counseling. They were deemed stable for discharge and had met discharge criteria  She met discharge criteria for extended lovenox  Therapy and received teaching.   BP 117/66 (BP Location: Right Arm)   Pulse 81   Temp (!) 97.5 F (36.4 C) (Oral)   Resp 18   Ht 5' 2"  (1.575 m)   Wt (!) 137 kg   LMP 11/10/2011   SpO2 100%   BMI 55.24 kg/m   Gen: alert, NAD, non-toxic appearing Pupils: equal, no scleral icterus Pulm: Lungs clear to auscultation, symmetric chest rise CV: regular rate and rhythm Abd: soft, mild expected nontender, nondistended. Upper midline bulge. No cellulitis. No incisional hernia Ext: no edema, no calf tenderness Skin: no rash, no jaundice   Discharge Instructions  Discharge Instructions    Ambulate hourly while awake   Complete by: As directed    Call MD for:  difficulty breathing, headache or visual disturbances   Complete by: As directed    Call MD for:  persistant dizziness or light-headedness   Complete by: As directed    Call MD for:  persistant nausea and vomiting   Complete by: As directed    Call MD for:  redness, tenderness, or signs of infection (pain, swelling, redness, odor or green/yellow discharge around incision site)   Complete by: As directed    Call MD for:  severe uncontrolled pain   Complete by: As directed    Call MD for:  temperature >101 F   Complete by: As directed  Diet bariatric full liquid   Complete by: As directed    Discharge instructions   Complete by: As directed    See bariatric discharge instructions   Incentive spirometry   Complete by: As directed    Perform hourly while awake     Allergies as of 10/15/2020      Reactions   Sulfamethoxazole-trimethoprim [bactrim] Itching   Codeine Nausea And Vomiting, Anxiety      Medication List    STOP taking these medications   Ciclopirox  0.77 % gel   ibuprofen 600 MG tablet Commonly known as: ADVIL   Pfizer-BioNTech COVID-19 Vacc 30 MCG/0.3ML injection Generic drug: COVID-19 mRNA vaccine (Pfizer)   Turmeric 500 MG Caps     TAKE these medications   acetaminophen 500 MG tablet Commonly known as: TYLENOL Take 2 tablets (1,000 mg total) by mouth every 8 (eight) hours for 5 days.   albuterol 108 (90 Base) MCG/ACT inhaler Commonly known as: VENTOLIN HFA USE 2 INHALATIONS BY MOUTH  EVERY 6 HOURS AS NEEDED FOR WHEEZING OR SHORTNESS OF  BREATH What changed: See the new instructions.   blood glucose meter kit and supplies Kit Dispense based on patient and insurance preference. Use up to four times daily as directed. (FOR ICD-9 250.00, 250.01). Notes to patient: Monitor Blood Sugar Frequently and keep a log for primary care physician, you may need to adjust medication dosage with rapid weight loss.     diclofenac Sodium 1 % Gel Commonly known as: VOLTAREN Apply 2 g topically 4 (four) times daily. Notes to patient: Avoid NSAIDs for 6-8 weeks after surgery   enoxaparin 60 MG/0.6ML injection Commonly known as: LOVENOX Inject 0.6 mLs (60 mg total) into the skin every 12 (twelve) hours for 14 days.   Flovent HFA 110 MCG/ACT inhaler Generic drug: fluticasone USE 2 INHALATIONS BY MOUTH  TWICE DAILY What changed: See the new instructions.   losartan-hydrochlorothiazide 100-25 MG tablet Commonly known as: HYZAAR TAKE 1 TABLET BY MOUTH  DAILY Notes to patient: Monitor Blood Pressure Daily and keep a log for primary care physician.  You may need to make changes to your medications with rapid weight loss.     multivitamin with minerals Tabs tablet Take 1 tablet by mouth daily. Vitafusion   omeprazole 20 MG capsule Commonly known as: PRILOSEC Take 1 capsule (20 mg total) by mouth daily.   ondansetron 4 MG disintegrating tablet Commonly known as: ZOFRAN-ODT Take 1 tablet (4 mg total) by mouth every 6 (six) hours as needed  for nausea or vomiting.   polyethylene glycol 17 g packet Commonly known as: MIRALAX / GLYCOLAX Take 17 g by mouth daily as needed for moderate constipation or severe constipation.   potassium chloride SA 20 MEQ tablet Commonly known as: KLOR-CON TAKE 1 TABLET BY MOUTH  DAILY What changed:   how much to take  how to take this  when to take this   pravastatin 80 MG tablet Commonly known as: PRAVACHOL Take 1 tablet (80 mg total) by mouth daily.   psyllium 58.6 % powder Commonly known as: METAMUCIL Take 1 packet by mouth daily as needed (constipation).   traMADol 50 MG tablet Commonly known as: ULTRAM Take 1 tablet (50 mg total) by mouth every 6 (six) hours as needed (pain).   triamcinolone cream 0.1 % Commonly known as: KENALOG Apply 1 application topically 2 (two) times daily.   valACYclovir 1000 MG tablet Commonly known as: VALTREX TAKE 1 TABLET BY MOUTH  DAILY  Follow-up Information    Leah Pickerel, MD. Go on 11/12/2020.   Specialty: General Surgery Why: at 11:00am.  Please arrive 15 minutes prior to your appointment time.  Thank you. Contact information: [address] [place] [address] [phone]        Leah Hurl, PA-C. Go on 12/02/2020.   Specialty: General Surgery Why: at 2:15pm for Dr. Redmond Pulling.  Please arrive 15 minutes prior to your appointment time.  Thank you. Contact information: [address] [place] [address] [phone]                The results of significant diagnostics from this hospitalization (including imaging, microbiology, ancillary and laboratory) are listed below for reference.    Significant Diagnostic Studies: No results found.  Labs: Basic Metabolic Panel: Recent Labs  Lab 10/15/20 0423  NA 136  K 3.7  CL 100  CO2 30  GLUCOSE 149*  BUN 12  CREATININE 0.83  CALCIUM 9.0   Liver Function Tests: Recent Labs  Lab 10/15/20 0423  AST 37  ALT 38  ALKPHOS 53  BILITOT 0.1*   PROT 7.3  ALBUMIN 3.5    CBC: Recent Labs  Lab 10/14/20 1055 10/15/20 0423  WBC  --  12.1*  NEUTROABS  --  9.2*  HGB 13.4 11.8*  HCT 40.7 36.4  MCV  --  89.4  PLT  --  282    CBG: Recent Labs  Lab 10/15/20 0008 10/15/20 0321 10/15/20 0722 10/15/20 1128 10/15/20 1632  GLUCAP 149* 139* 111* 83 80    Principal Problem:   Severe obesity (BMI >= 40) (HCC) Active Problems:   Hyperlipidemia   Essential hypertension   BACK PAIN, LUMBAR   OSA (obstructive sleep apnea)   Osteoarthritis of right knee   Diabetes type 2, controlled ([place])   Time coordinating discharge: 15 min  Signed:  Gayland Curry, MD [facility], [place] 10/16/2020, 3:23 PM

## 2020-10-16 NOTE — Telephone Encounter (Signed)
1.  Tell me about your pain and pain management? Pt denies any pain.   2.  Let's talk about fluid intake.  How much total fluid are you taking in? Pt states that she is working to meet goal of 35fl oz today.  She has already consumed 20oz with a protein shake and bottled water.   3.  How much protein have you taken in the last 2 days? Pt states she is working to meet goal of 60g of protein today.  She has already consumed one protein shake.  Patient plans to drink second shake later in the afternoon.   4.  Have you had nausea?  Tell me about when have experienced nausea and what you did to help? Pt denies nausea.   5.  Has the frequency or color changed with your urine? Pt states that she is urinating "fine" with no changes in frequency or urgency.     6.  Tell me what your incisions look like? "Incisions look fine". Pt denies a fever, chills.  Pt states incisions are not swollen, open, or draining.  Pt encouraged to call [facility] if incisions change.   7.  Have you been passing gas? BM? Pt states that she has not had a BM yet. Patient instructed to take Miralax or MoM no later than POD 3 if no BM.     8.  If a problem or question were to arise who would you call?  Do you know contact numbers for [facility], [facility], and [facility]? Pt denies dehydration symptoms.  Pt can describe s/sx of dehydration.  Pt knows to call [facility] for surgical, [facility] for nutrition, and [facility] for non-urgent questions or concerns.   9.  How has the walking going? Pt states she is walking around and able to be active without difficulty.   10. Are you still using your incentive spirometer?  If so, how often? Pt states that she has done the IS once today. Pt encouraged to use incentive spirometer, at least 10x every hour while awake until she sees the surgeon.  11.  How are your vitamins and calcium going?  How are you taking them? Pt states that she is taking her supplements and vitamins without difficulty.  She is utilizing the template  schedule given at discharge.  Reminded patient that the first 30 days post-operatively are important for successful recovery.  Practice good hand hygiene, wearing a mask when appropriate (since optional in most places), and minimizing exposure to people who live outside of the home, especially if they are exhibiting any respiratory, GI, or illness-like symptoms.

## 2020-10-17 ENCOUNTER — Telehealth (HOSPITAL_COMMUNITY): Payer: Self-pay | Admitting: *Deleted

## 2020-10-17 NOTE — Telephone Encounter (Signed)
1.  Tell me about your pain and pain management? Pt c/o "slight headache".  Pt states that she has taken some Tylenol.  Pt encouraged to really focus on drinking fluids and meeting goal today.  Pt instructed to call surgeon's office if symptoms worsen.   2.  Let's talk about fluid intake.  How much total fluid are you taking in? Pt states that she is working to meet goal of 64 oz of fluid today.  Pt has consumed one protein shake and "some water".  Pt plans to drink rest of protein, water and broth for the remainder of the day to meet goal.  3.  How much protein have you taken in the last 2 days? Pt states that she is working to meet goal of goal of 60g of protein today.  Pt has already consumed one protein shake.  Pt plans to drink remainder of protein throughout the rest of the day to meet goal.  4.  Have you had nausea?  Tell me about when have experienced nausea and what you did to help? Pt denies nausea.   5.  Has the frequency or color changed with your urine? Pt states that she is urinating "fine" with no changes in frequency or urgency.     6.  Tell me what your incisions look like? "Incisions look fine". Pt denies a fever, chills.  Pt states incisions are not swollen, open, or draining.  Pt encouraged to call [facility] if incisions change.   7.  Have you been passing gas? BM? Pt states that she has not had a BM.  Pt instructed to take either Miralax or MoM as instructed per "Gastric Bypass/Sleeve Discharge Home Care Instructions".  Pt to call surgeon's office if not able to have BM with medication.   8.  If a problem or question were to arise who would you call?  Do you know contact numbers for [facility], [facility], and [facility]? Pt denies dehydration symptoms.  Pt can describe s/sx of dehydration.  Pt knows to call [facility] for surgical, [facility] for nutrition, and [facility] for non-urgent questions or concerns.   9.  How has the walking going? Pt states she is walking around and able to be active without  difficulty.   10. Are you still using your incentive spirometer?  If so, how often? Pt encouraged to use incentive spirometer, at least 10x every hour while awake until she sees the surgeon.  11.  How are your vitamins and calcium going?  How are you taking them? Pt states that she is taking her supplements and vitamins without difficulty.  Pt states that she has already taken Lovenox injection for this morning without any difficulty.  Reminded patient that the first 30 days post-operatively are important for successful recovery.  Practice good hand hygiene, wearing a mask when appropriate (since optional in most places), and minimizing exposure to people who live outside of the home, especially if they are exhibiting any respiratory, GI, or illness-like symptoms.

## 2020-10-19 ENCOUNTER — Other Ambulatory Visit: Payer: Self-pay | Admitting: Family

## 2020-10-21 ENCOUNTER — Telehealth: Payer: Self-pay | Admitting: *Deleted

## 2020-10-21 NOTE — Telephone Encounter (Signed)
Received patient's SOB from [id number] for Durolane gel injection.   Patient's OOP is approximately $420. Patient states she just had gastric bypass surgery 1 week ago and would like to hold off on gel injection for now. She will call us back when she decides to have injection.   See SOB scanned into chart

## 2020-10-22 ENCOUNTER — Telehealth: Payer: Self-pay | Admitting: Dietician

## 2020-10-22 NOTE — Telephone Encounter (Signed)
Returned message from patient; she asks whether she can have broth or soup 1 week post-op; advised plain broth or strained soup/ lowfat cream soup, and gave option of adding protein powder. Patient states she is drinking Atkins shakes, and had episode of loose stool this morning. Encouraged sipping small amounts of shake at a time, throughout the day to reach goal. Patient is familiar with 60g daily protein goal.

## 2020-10-23 ENCOUNTER — Other Ambulatory Visit: Payer: Self-pay | Admitting: Family

## 2020-10-26 NOTE — Progress Notes (Signed)
Cardiology Office Note:    Date:  10/28/2020   ID:  Leah Hampton, DOB October 18, 1955, MRN [id number]  PCP:  Debbrah Alar, NP  Cardiologist:  None  Electrophysiologist:  None   Referring MD: Debbrah Alar, NP   Chief Complaint  Patient presents with   Shortness of Breath     History of Present Illness:    Leah Hampton is a 65 y.o. female with a hx of T2DM, hypertension, hyperlipidemia, OSA, morbid obesity who presents for follow-up.  She was referred by Dr. Redmond Pulling for preop evaluation prior to bariatric surgery, initially seen on 02/26/20.  She denied any chest pain but reported occasional dyspnea on exertion.  States that she has to walk up a flight of stairs in her home multiple times per day (12 steps).  No smoking history.  Father died of MI in 60s.  Echocardiogram on 03/18/2020 showed normal biventricular function, grade 1 diastolic dysfunction, no significant valvular disease.  Lexiscan Myoview on 08/28/2020 showed normal perfusion, EF 64%.  Calcium score on 08/27/2020 was 0.  Since last clinic visit, she reports she is doing well.  Underwent bariatric surgery 5/31.  Has already lost 15 lbs.  Denies any chest pain, dyspnea, lightheadedness, syncope, lower extremity edema, or palpitations.   Wt Readings from Last 3 Encounters:  10/28/20 286 lb (129.7 kg)  10/14/20 (!) 302 lb 0.5 oz (137 kg)  10/07/20 (!) 302 lb (137 kg)      Past Medical History:  Diagnosis Date   Bronchitis    Chronic bronchitis (HCC)    Cystitis    Diabetes type 2, controlled ([place])    Hematuria    urologice eval, Dr Estill Dooms   History of chest pain    Hyperbilirubinemia    Hyperlipemia    Hypertelorism    Hypertension    Leukocytosis    Obesity    Osteoarthritis    right knee   Sleep apnea 2012   slight    Vaginal Pap smear, abnormal    Vitamin D deficiency 02/09/2018    Past Surgical History:  Procedure Laterality Date   CERVICAL POLYPECTOMY  07/15/2020   Procedure:  POLYPECTOMY with Myosure;  Surgeon: Lavonia Drafts, MD;  Location: [place];  Service: Gynecology;;   COLONOSCOPY WITH PROPOFOL N/A 03/27/2015   Procedure: COLONOSCOPY WITH PROPOFOL;  Surgeon: Milus Banister, MD;  Location: [facility];  Service: Endoscopy;  Laterality: N/A;   COLONOSCOPY WITH PROPOFOL N/A 10/02/2020   Procedure: COLONOSCOPY WITH PROPOFOL;  Surgeon: Milus Banister, MD;  Location: [facility];  Service: Endoscopy;  Laterality: N/A;   COLPOSCOPY N/A 07/15/2020   Procedure: COLPOSCOPY;  Surgeon: Lavonia Drafts, MD;  Location: [place];  Service: Gynecology;  Laterality: N/A;   DILATATION & CURETTAGE/HYSTEROSCOPY WITH MYOSURE N/A 01/05/2017   Procedure: DILATATION & CURETTAGE/HYSTEROSCOPY WITH MYOSURE;  Surgeon: Thurnell Lose, MD;  Location: [facility];  Service: Gynecology;  Laterality: N/A;  PostMenopausal Bleeding   HYSTEROSCOPY WITH D & C N/A 05/22/2019   Procedure: DILATATION AND CURETTAGE /HYSTEROSCOPY, POLYPECTOMY WITH MYOSURE;  Surgeon: Lavonia Drafts, MD;  Location: [facility];  Service: Gynecology;  Laterality: N/A;   HYSTEROSCOPY WITH D & C N/A 07/15/2020   Procedure: DILATATION AND CURETTAGE /HYSTEROSCOPY;  Surgeon: Lavonia Drafts, MD;  Location: [place];  Service: Gynecology;  Laterality: N/A;   KNEE ARTHROSCOPY  04/2007   left knee   TOTAL KNEE ARTHROPLASTY Left 10/2008   Dr Roxine Caddy   UPPER GI ENDOSCOPY N/A 10/14/2020   Procedure: UPPER  GI ENDOSCOPY;  Surgeon: [name], MD;  Location: [facility];  Service: General;  Laterality: N/A;    Current Medications: Current Meds  Medication Sig   albuterol (VENTOLIN HFA) 108 (90 Base) MCG/ACT inhaler USE 2 INHALATIONS BY MOUTH  EVERY 6 HOURS AS NEEDED FOR WHEEZING OR SHORTNESS OF  BREATH (Patient taking differently: Inhale 2 puffs into the lungs every 6 (six) hours as needed for wheezing (Cough).)   blood glucose meter kit and supplies KIT Dispense based on patient and insurance preference. Use up to four  times daily as directed. (FOR ICD-9 250.00, 250.01).   diclofenac Sodium (VOLTAREN) 1 % GEL Apply 2 g topically 4 (four) times daily.   enoxaparin (LOVENOX) 60 MG/0.6ML injection Inject 0.6 mLs (60 mg total) into the skin every 12 (twelve) hours for 14 days.   FLOVENT HFA 110 MCG/ACT inhaler USE 2 INHALATIONS BY MOUTH  TWICE DAILY (Patient taking differently: Inhale 2 puffs into the lungs 2 (two) times daily.)   losartan-hydrochlorothiazide (HYZAAR) 100-25 MG tablet TAKE 1 TABLET BY MOUTH  DAILY   Multiple Vitamin (MULTIVITAMIN WITH MINERALS) TABS tablet Take 1 tablet by mouth daily. Vitafusion   omeprazole (PRILOSEC) 20 MG capsule TAKE 1 CAPSULE BY MOUTH  DAILY   ondansetron (ZOFRAN-ODT) 4 MG disintegrating tablet Take 1 tablet (4 mg total) by mouth every 6 (six) hours as needed for nausea or vomiting.   polyethylene glycol (MIRALAX / GLYCOLAX) 17 g packet Take 17 g by mouth daily as needed for moderate constipation or severe constipation.   potassium chloride SA (KLOR-CON) 20 MEQ tablet TAKE 1 TABLET BY MOUTH  DAILY (Patient taking differently: Take 20 mEq by mouth daily. TAKE 1 TABLET BY MOUTH  DAILY)   pravastatin (PRAVACHOL) 80 MG tablet TAKE 1 TABLET BY MOUTH  DAILY   psyllium (METAMUCIL) 58.6 % powder Take 1 packet by mouth daily as needed (constipation).   triamcinolone cream (KENALOG) 0.1 % Apply 1 application topically 2 (two) times daily.   valACYclovir (VALTREX) 1000 MG tablet TAKE 1 TABLET BY MOUTH  DAILY (Patient taking differently: Take 1,000 mg by mouth daily.)   [DISCONTINUED] traMADol (ULTRAM) 50 MG tablet Take 1 tablet (50 mg total) by mouth every 6 (six) hours as needed (pain).     Allergies:   Sulfamethoxazole-trimethoprim [bactrim] and Codeine   Social History   Socioeconomic History   Marital status: Single    Spouse name: Not on file   Number of children: 2   Years of education: Not on file   Highest education level: Not on file  Occupational History   Occupation:  disabled  Tobacco Use   Smoking status: Never   Smokeless tobacco: Never  Vaping Use   Vaping Use: Never used  Substance and Sexual Activity   Alcohol use: No   Drug use: No   Sexual activity: Not Currently  Other Topics Concern   Not on file  Social History Narrative   Regular exercise:  No   Lives alone   Has 2 grown sons, both local.  1 grandson   On disability due to her knee pain   Completed 12th grade   Enjoys television   No pets.    Social Determinants of Health   Financial Resource Strain: Not on file  Food Insecurity: No Food Insecurity   Worried About Charity fundraiser in the Last Year: Never true   Ran Out of Food in the Last Year: Never true  Transportation Needs: Not on file  Physical Activity: Not on file  Stress: Not on file  Social Connections: Not on file     Family History: The patient's family history includes Alcohol abuse in her brother; Arthritis in her mother and another family member; CAD (age of onset: 78) in her sister; CVA in her sister; Cirrhosis in her brother; Coronary artery disease in an other family member; Diabetes in an other family member; Diabetes Mellitus II in her mother and sister; Heart attack (age of onset: 25) in her father; Heart failure in her mother; Hypertension in her mother and sister; Stroke in her mother and another family member. There is no history of Colon cancer.  ROS:   Please see the history of present illness.     All other systems reviewed and are negative.  EKGs/Labs/Other Studies Reviewed:    The following studies were reviewed today:   EKG:  EKG is not ordered today.  The ekg ordered at prior clinic visit demonstrates normal sinus rhythm, rate 87, no ST abnormalities  Recent Labs: 10/15/2020: ALT 38; BUN 12; Creatinine, Ser 0.83; Hemoglobin 11.8; Platelets 282; Potassium 3.7; Sodium 136  Recent Lipid Panel    Component Value Date/Time   CHOL 149 06/17/2020 1059   CHOL 171 05/23/2017 1142   TRIG 98.0  06/17/2020 1059   HDL 44.00 06/17/2020 1059   HDL 55 05/23/2017 1142   CHOLHDL 3 06/17/2020 1059   VLDL 19.6 06/17/2020 1059   LDLCALC 86 06/17/2020 1059   LDLCALC 104 (H) 05/23/2017 1142    Physical Exam:    VS:  BP 130/77   Pulse 91   Ht _0  (1.575 m)   Wt 286 lb (129.7 kg)   LMP 11/10/2011   SpO2 98%   BMI 52.31 kg/m     Wt Readings from Last 3 Encounters:  10/28/20 286 lb (129.7 kg)  10/14/20 (!) 302 lb 0.5 oz (137 kg)  10/07/20 (!) 302 lb (137 kg)     GEN:  in no acute distress HEENT: Normal NECK: No JVD; No carotid bruits LYMPHATICS: No lymphadenopathy CARDIAC: RRR, no murmurs, rubs, gallops RESPIRATORY:  Clear to auscultation without rales, wheezing or rhonchi  ABDOMEN: Soft, non-tender, non-distended MUSCULOSKELETAL: Mild lower extremity edema SKIN: Warm and dry NEUROLOGIC:  Alert and oriented x 3 PSYCHIATRIC:  Normal affect   ASSESSMENT:    1. Chest pain of uncertain etiology   2. Lower extremity edema   3. Essential hypertension   4. Hyperlipidemia, unspecified hyperlipidemia type   5. Morbid obesity ([place])     PLAN:    Chest pain: Atypical in description but does have CAD risk factors (age, hypertension, hyperlipidemia, T2DM).   Echocardiogram on 03/18/2020 showed normal biventricular function, grade 1 diastolic dysfunction, no significant valvular disease.  Lexiscan Myoview on 08/28/2020 showed normal perfusion, EF 64%.  Calcium score on 08/27/2020 was 0.  Lower extremity edema: Mild, suspect related to obesity.  Echocardiogram unremarkable  Hypertension: On losartan-HCTZ 100-25 mg.  Appears controlled  Hyperlipidemia: On pravastatin 80 mg daily.  LDL 102 on 07/27/2019.  Calcium score on 08/27/2020 was 0.  T2DM: A1c 6.7% on 10/07/2020  Morbid obesity:Body mass index is 52.31 kg/m.  Status post bariatric surgery on 10/14/2020, has already lost 15 pounds.  RTC in 1 year     Medication Adjustments/Labs and Tests Ordered: Current medicines are  reviewed at length with the patient today.  Concerns regarding medicines are outlined above.  No orders of the defined types were placed in this encounter.  No orders of the defined types were placed in this encounter.   Patient Instructions  Medication Instructions:  Your physician recommends that you continue on your current medications as directed. Please refer to the Current Medication list given to you today.  *If you need a refill on your cardiac medications before your next appointment, please call your pharmacy*  Follow-Up: At [facility], you and your health needs are our priority.  As part of our continuing mission to provide you with exceptional heart care, we have created designated Provider Care Teams.  These Care Teams include your primary Cardiologist (physician) and Advanced Practice Providers (APPs -  Physician Assistants and Nurse Practitioners) who all work together to provide you with the care you need, when you need it.  We recommend signing up for the patient portal called "MyChart".  Sign up information is provided on this After Visit Summary.  MyChart is used to connect with patients for Virtual Visits (Telemedicine).  Patients are able to view lab/test results, encounter notes, upcoming appointments, etc.  Non-urgent messages can be sent to your provider as well.   To learn more about what you can do with MyChart, go to [email or web address].    Your next appointment:   12 month(s)  The format for your next appointment:   In Person  Provider:   Oswaldo Milian, MD       Signed, Donato Heinz, MD  10/28/2020 2:29 PM    [place]

## 2020-10-27 LAB — SURGICAL PATHOLOGY

## 2020-10-28 ENCOUNTER — Other Ambulatory Visit: Payer: Self-pay

## 2020-10-28 ENCOUNTER — Encounter: Payer: Self-pay | Admitting: Cardiology

## 2020-10-28 ENCOUNTER — Encounter: Payer: Medicare Other | Attending: General Surgery | Admitting: Skilled Nursing Facility1

## 2020-10-28 ENCOUNTER — Ambulatory Visit (INDEPENDENT_AMBULATORY_CARE_PROVIDER_SITE_OTHER): Payer: Medicare Other | Admitting: Cardiology

## 2020-10-28 VITALS — BP 130/77 | HR 91 | Ht 62.0 in | Wt 286.0 lb

## 2020-10-28 DIAGNOSIS — E785 Hyperlipidemia, unspecified: Secondary | ICD-10-CM | POA: Diagnosis not present

## 2020-10-28 DIAGNOSIS — I10 Essential (primary) hypertension: Secondary | ICD-10-CM | POA: Diagnosis not present

## 2020-10-28 DIAGNOSIS — R600 Localized edema: Secondary | ICD-10-CM

## 2020-10-28 DIAGNOSIS — E119 Type 2 diabetes mellitus without complications: Secondary | ICD-10-CM

## 2020-10-28 DIAGNOSIS — R079 Chest pain, unspecified: Secondary | ICD-10-CM

## 2020-10-28 NOTE — Patient Instructions (Signed)

## 2020-10-29 ENCOUNTER — Ambulatory Visit: Payer: Medicare Other | Admitting: Family

## 2020-10-30 ENCOUNTER — Other Ambulatory Visit: Payer: Self-pay | Admitting: Emergency Medicine

## 2020-10-30 ENCOUNTER — Other Ambulatory Visit: Payer: Self-pay | Admitting: Family

## 2020-10-30 DIAGNOSIS — Z1231 Encounter for screening mammogram for malignant neoplasm of breast: Secondary | ICD-10-CM

## 2020-10-31 ENCOUNTER — Ambulatory Visit: Payer: Medicare Other | Admitting: Obstetrics & Gynecology

## 2020-11-03 NOTE — Progress Notes (Signed)
2 Week Post-Operative Nutrition Class   Patient was seen on 10/28/2020 for Post-Operative Nutrition education at [facility].    Surgery date: 10/14/2020 Surgery type: sleeve Start weight at [facility]: 312 Weight today: 285 Bowel Habits: Every day to every other day no complaints  Blood sugars around 100   Body Composition Scale 11/03/2020  Current Body Weight 285  Total Body Fat % 51.1  Visceral Fat 26  Fat-Free Mass % 48.8   Total Body Water % 38.9  Muscle-Mass lbs 28  BMI 52  Body Fat Displacement          Torso  lbs 90.4         Left Leg  lbs 18         Right Leg  lbs 18         Left Arm  lbs 9         Right Arm   lbs 9      The following the learning objectives were met by the patient during this course: Identifies Phase 3 (Soft, High Proteins) Dietary Goals and will begin from 2 weeks post-operatively to 2 months post-operatively Identifies appropriate sources of fluids and proteins  Identifies appropriate fat sources and healthy verses unhealthy fat types   States protein recommendations and appropriate sources post-operatively Identifies the need for appropriate texture modifications, mastication, and bite sizes when consuming solids Identifies appropriate fat consumption and sources Identifies appropriate multivitamin and calcium sources post-operatively Describes the need for physical activity post-operatively and will follow MD recommendations States when to call healthcare provider regarding medication questions or post-operative complications   Handouts given during class include: Phase 3A: Soft, High Protein Diet Handout Phase 3 High Protein Meals Healthy Fats   Follow-Up Plan: Patient will follow-up at [facility] in 6 weeks for 2 month post-op nutrition visit for diet advancement per MD.

## 2020-11-04 ENCOUNTER — Encounter: Payer: Self-pay | Admitting: Family

## 2020-11-04 ENCOUNTER — Other Ambulatory Visit: Payer: Self-pay

## 2020-11-04 ENCOUNTER — Ambulatory Visit (INDEPENDENT_AMBULATORY_CARE_PROVIDER_SITE_OTHER): Payer: Medicare Other | Admitting: Family

## 2020-11-04 ENCOUNTER — Ambulatory Visit: Payer: Medicare Other | Attending: Internal Medicine

## 2020-11-04 ENCOUNTER — Telehealth: Payer: Self-pay | Admitting: Skilled Nursing Facility1

## 2020-11-04 VITALS — BP 108/57 | HR 90 | Temp 98.3°F | Resp 16 | Wt 286.0 lb

## 2020-11-04 DIAGNOSIS — J45909 Unspecified asthma, uncomplicated: Secondary | ICD-10-CM | POA: Insufficient documentation

## 2020-11-04 DIAGNOSIS — K5900 Constipation, unspecified: Secondary | ICD-10-CM | POA: Insufficient documentation

## 2020-11-04 DIAGNOSIS — E119 Type 2 diabetes mellitus without complications: Secondary | ICD-10-CM | POA: Diagnosis not present

## 2020-11-04 DIAGNOSIS — I10 Essential (primary) hypertension: Secondary | ICD-10-CM

## 2020-11-04 DIAGNOSIS — Z23 Encounter for immunization: Secondary | ICD-10-CM | POA: Diagnosis not present

## 2020-11-04 DIAGNOSIS — A6000 Herpesviral infection of urogenital system, unspecified: Secondary | ICD-10-CM

## 2020-11-04 DIAGNOSIS — Z903 Acquired absence of stomach [part of]: Secondary | ICD-10-CM | POA: Diagnosis not present

## 2020-11-04 DIAGNOSIS — K219 Gastro-esophageal reflux disease without esophagitis: Secondary | ICD-10-CM | POA: Insufficient documentation

## 2020-11-04 MED ORDER — LOSARTAN POTASSIUM 100 MG PO TABS: 100.0000 mg | ORAL_TABLET | Freq: Every day | ORAL | 0 refills | Status: DC

## 2020-11-04 MED ORDER — TETANUS-DIPHTH-ACELL PERTUSSIS 5-2.5-18.5 LF-MCG/0.5 IM SUSP: 0.5000 mL | Freq: Once | INTRAMUSCULAR | 0 refills | Status: AC

## 2020-11-04 MED ORDER — SHINGRIX 50 MCG/0.5ML IM SUSR: 0.5000 mL | Freq: Once | INTRAMUSCULAR | 1 refills | Status: AC

## 2020-11-04 NOTE — Assessment & Plan Note (Signed)
Recommended that she try dulcolax this evening.

## 2020-11-04 NOTE — Progress Notes (Signed)
   Covid-19 Vaccination Clinic  Name:  CHEYANNA STRICK    MRN: [id number] DOB: September 05, 1955  11/04/2020  Ms. Wilkowski was observed post Covid-19 immunization for 15 minutes without incident. She was provided with Vaccine Information Sheet and instruction to access the V-Safe system.   Ms. Witherspoon was instructed to call 911 with any severe reactions post vaccine: Difficulty breathing  Swelling of face and throat  A fast heartbeat  A bad rash all over body  Dizziness and weakness   Immunizations Administered     Name Date Dose VIS Date Route   PFIZER Comrnaty(Gray TOP) Covid-19 Vaccine 11/04/2020 11:31 AM 0.3 mL 04/24/2020 Intramuscular   Manufacturer: [employer]   Lot: [id number]   [place]: [phone]

## 2020-11-04 NOTE — Assessment & Plan Note (Signed)
Too soon to recheck A1C. I expect it to come down with her weight.

## 2020-11-04 NOTE — Assessment & Plan Note (Signed)
She has lost about 20 pounds since her procedure which she is pleased about.

## 2020-11-04 NOTE — Assessment & Plan Note (Signed)
Stable. Continue omeprazole 20mg  once daily.

## 2020-11-04 NOTE — Assessment & Plan Note (Signed)
Uncontrolled. Stop losartan hctz. Stop Kdur (potassiume supplement). Start losartan 100mg  once daily.

## 2020-11-04 NOTE — Assessment & Plan Note (Signed)
Stable, continue flovent 2 puffs bid and prn albuterol MDI.

## 2020-11-04 NOTE — Telephone Encounter (Signed)
RD called pt to verify fluid intake once starting soft, solid proteins 2 week post-bariatric surgery.   Daily Fluid intake: 64+ oz Daily Protein intake: 60 g Bowel Habits: has not had a bowel movement since Sunday stating mirilax does not help. Pt state she saw her doctor yesterday which advised her to start a different regimen; dulcolax   Concerns/issues:   None stated  Call your surgeon if you do not have a bowel movement after the new regimen

## 2020-11-04 NOTE — Assessment & Plan Note (Signed)
Stable on suppressive therapy- valtrex 1000mg  PO daily. Continue same.

## 2020-11-04 NOTE — Progress Notes (Signed)
Subjective:    Patient ID: Leah Hampton, female    DOB: 06-27-1955, 65 y.o.   MRN: [id number]  Chief Complaint  Patient presents with   Follow-up    Here for follow up after gastric sleve procedure    HPI  Patient is in today for follow up.  She had laparoscopic Gastric Sleeve surgery on 10/14/20. She reports that her surgery and recovery was uneventful.  Her only symptom is some occasional nausea for which she is taking prn zofran.   Wt Readings from Last 3 Encounters:  11/04/20 286 lb (129.7 kg)  11/03/20 285 lb (129.3 kg)  10/28/20 286 lb (129.7 kg)   DM2- not on oral meds. Not checking sugars.  Lab Results  Component Value Date   HGBA1C 6.7 (H) 10/07/2020   HGBA1C 6.7 (H) 06/17/2020   HGBA1C 6.6 (H) 03/14/2020   Lab Results  Component Value Date   MICROALBUR 0.97 07/13/2011   LDLCALC 86 06/17/2020   CREATININE 0.83 10/15/2020   HTN- home bp reading 90-129/56-90 BP Readings from Last 3 Encounters:  11/04/20 (!) 108/57  10/28/20 130/77  10/15/20 117/66   HSV- reports no breakouts on valtrex 1031m daily.   Constipation- reports last BM was Sunday.    Hyperlipidemia- maintained on pravastatin.   Lab Results  Component Value Date   CHOL 149 06/17/2020   HDL 44.00 06/17/2020   LDLCALC 86 06/17/2020   TRIG 98.0 06/17/2020   CHOLHDL 3 06/17/2020   Asthma- reports symptoms stable on flovent 2 puffs twice daily. Not using albuterol regularly.   GERD- maintained on omeprazole for gerd.    Health Maintenance Due  Topic Date Due   Pneumococcal Vaccine 032693Years old (1 - PCV) Never done   Zoster Vaccines- Shingrix (1 of 2) Never done   TETANUS/TDAP  07/03/2018   OPHTHALMOLOGY EXAM  06/21/2019   FOOT EXAM  03/08/2020   COVID-19 Vaccine (4 - Booster for Pfizer series) 06/14/2020    Past Medical History:  Diagnosis Date   Bronchitis    Chronic bronchitis (HCC)    Cystitis    Diabetes type 2, controlled ([facility]    Hematuria    urologice eval, Dr EEstill Dooms   History of chest pain    Hyperbilirubinemia    Hyperlipemia    Hypertelorism    Hypertension    Leukocytosis    Obesity    Osteoarthritis    right knee   Sleep apnea 2012   slight    Vaginal Pap smear, abnormal    Vitamin D deficiency 02/09/2018    Past Surgical History:  Procedure Laterality Date   CERVICAL POLYPECTOMY  07/15/2020   Procedure: POLYPECTOMY with Myosure;  Surgeon: HLavonia Drafts MD;  [facility]  Service: Gynecology;;   COLONOSCOPY WITH PROPOFOL N/A 03/27/2015   Procedure: COLONOSCOPY WITH PROPOFOL;  Surgeon: DMilus Banister MD;  Location: [facility];  Service: Endoscopy;  Laterality: N/A;   COLONOSCOPY WITH PROPOFOL N/A 10/02/2020   Procedure: COLONOSCOPY WITH PROPOFOL;  Surgeon: JMilus Banister MD;  Location: [facility];  Service: Endoscopy;  Laterality: N/A;   COLPOSCOPY N/A 07/15/2020   Procedure: COLPOSCOPY;  Surgeon: HLavonia Drafts MD;  [place] [facility]  Service: Gynecology;  Laterality: N/A;   DILATATION & CURETTAGE/HYSTEROSCOPY WITH MYOSURE N/A 01/05/2017   Procedure: DILATATION & CURETTAGE/HYSTEROSCOPY WITH MYOSURE;  Surgeon: VThurnell Lose MD;  [facility];  Service: Gynecology;  Laterality: N/A;  PostMenopausal Bleeding   HYSTEROSCOPY WITH D & C N/A 05/22/2019  Procedure: DILATATION AND CURETTAGE /HYSTEROSCOPY, POLYPECTOMY WITH MYOSURE;  Surgeon: Lavonia Drafts, MD;  Location: [facility];  Service: Gynecology;  Laterality: N/A;   HYSTEROSCOPY WITH D & C N/A 07/15/2020   Procedure: DILATATION AND CURETTAGE /HYSTEROSCOPY;  Surgeon: Lavonia Drafts, MD;  Location: [place];  Service: Gynecology;  Laterality: N/A;   KNEE ARTHROSCOPY  04/2007   left knee   TOTAL KNEE ARTHROPLASTY Left 10/2008   Dr Roxine Caddy   UPPER GI ENDOSCOPY N/A 10/14/2020   Procedure: UPPER GI ENDOSCOPY;  Surgeon: [name], MD;  Location: [facility];  Service: General;  Laterality: N/A;    Family History  Problem Relation Age of Onset   Heart  failure Mother    Arthritis Mother    Hypertension Mother    Stroke Mother    Diabetes Mellitus II Mother    Heart attack Father 53   Diabetes Other    Arthritis Other    Stroke Other    Coronary artery disease Other    Hypertension Sister        x 2   CVA Sister    CAD Sister 66   Diabetes Mellitus II Sister    Cirrhosis Brother    Alcohol abuse Brother    Colon cancer Neg Hx     Social History   Socioeconomic History   Marital status: Single    Spouse name: Not on file   Number of children: 2   Years of education: Not on file   Highest education level: Not on file  Occupational History   Occupation: disabled  Tobacco Use   Smoking status: Never   Smokeless tobacco: Never  Vaping Use   Vaping Use: Never used  Substance and Sexual Activity   Alcohol use: No   Drug use: No   Sexual activity: Not Currently  Other Topics Concern   Not on file  Social History Narrative   Regular exercise:  No   Lives alone   Has 2 grown sons, both local.  1 grandson   On disability due to her knee pain   Completed 12th grade   Enjoys television   No pets.    Social Determinants of Radio broadcast assistant Strain: Not on file  Food Insecurity: No Food Insecurity   Worried About Charity fundraiser in the Last Year: Never true   Ran Out of Food in the Last Year: Never true  Transportation Needs: Not on file  Physical Activity: Not on file  Stress: Not on file  Social Connections: Not on file  Intimate Partner Violence: Not on file    Outpatient Medications Prior to Visit  Medication Sig Dispense Refill   albuterol (VENTOLIN HFA) 108 (90 Base) MCG/ACT inhaler USE 2 INHALATIONS BY MOUTH  EVERY 6 HOURS AS NEEDED FOR WHEEZING OR SHORTNESS OF  BREATH (Patient taking differently: Inhale 2 puffs into the lungs every 6 (six) hours as needed for wheezing (Cough).) 34 g 3   blood glucose meter kit and supplies KIT Dispense based on patient and insurance preference. Use up to four  times daily as directed. (FOR ICD-9 250.00, 250.01). 1 each 0   diclofenac Sodium (VOLTAREN) 1 % GEL Apply 2 g topically 4 (four) times daily. 100 g 1   FLOVENT HFA 110 MCG/ACT inhaler USE 2 INHALATIONS BY MOUTH  TWICE DAILY (Patient taking differently: Inhale 2 puffs into the lungs 2 (two) times daily.) 36 g 1   losartan-hydrochlorothiazide (HYZAAR) 100-25 MG tablet TAKE 1 TABLET  BY MOUTH  DAILY 90 tablet 1   Multiple Vitamin (MULTIVITAMIN WITH MINERALS) TABS tablet Take 1 tablet by mouth daily. Vitafusion     omeprazole (PRILOSEC) 20 MG capsule TAKE 1 CAPSULE BY MOUTH  DAILY 90 capsule 3   ondansetron (ZOFRAN-ODT) 4 MG disintegrating tablet Take 1 tablet (4 mg total) by mouth every 6 (six) hours as needed for nausea or vomiting. 20 tablet 0   polyethylene glycol (MIRALAX / GLYCOLAX) 17 g packet Take 17 g by mouth daily as needed for moderate constipation or severe constipation.     potassium chloride SA (KLOR-CON) 20 MEQ tablet TAKE 1 TABLET BY MOUTH  DAILY (Patient taking differently: Take 20 mEq by mouth daily. TAKE 1 TABLET BY MOUTH  DAILY) 90 tablet 1   pravastatin (PRAVACHOL) 80 MG tablet TAKE 1 TABLET BY MOUTH  DAILY 90 tablet 1   psyllium (METAMUCIL) 58.6 % powder Take 1 packet by mouth daily as needed (constipation).     triamcinolone cream (KENALOG) 0.1 % Apply 1 application topically 2 (two) times daily. 30 g 0   valACYclovir (VALTREX) 1000 MG tablet TAKE 1 TABLET BY MOUTH  DAILY (Patient taking differently: Take 1,000 mg by mouth daily.) 90 tablet 3   enoxaparin (LOVENOX) 60 MG/0.6ML injection Inject 0.6 mLs (60 mg total) into the skin every 12 (twelve) hours for 14 days. 16.8 mL 0   No facility-administered medications prior to visit.    Allergies  Allergen Reactions   Sulfamethoxazole-Trimethoprim [Bactrim] Itching   Codeine Nausea And Vomiting and Anxiety    ROS     Objective:     BP (!) 108/57 (BP Location: Right Arm, Patient Position: Sitting, Cuff Size: Large)    Pulse 90   Temp 98.3 F (36.8 C) (Oral)   Resp 16   Wt 286 lb (129.7 kg)   LMP 11/10/2011   SpO2 97%   BMI 52.31 kg/m  Wt Readings from Last 3 Encounters:  11/04/20 286 lb (129.7 kg)  11/03/20 285 lb (129.3 kg)  10/28/20 286 lb (129.7 kg)       Assessment & Plan:   Problem List Items Addressed This Visit   None   I have discontinued Indiyah M. Catarino's enoxaparin. I am also having her maintain her blood glucose meter kit and supplies, multivitamin with minerals, psyllium, albuterol, polyethylene glycol, losartan-hydrochlorothiazide, Flovent HFA, valACYclovir, potassium chloride SA, triamcinolone cream, diclofenac Sodium, ondansetron, omeprazole, and pravastatin.  No orders of the defined types were placed in this encounter.

## 2020-11-04 NOTE — Patient Instructions (Addendum)
Stop losartan hctz. Stop Kdur (potassiume supplement). Start losartan 100mg  once daily.  Try dulcolax tonight for constipation.   Please get your second covid booster.

## 2020-11-06 ENCOUNTER — Other Ambulatory Visit (HOSPITAL_BASED_OUTPATIENT_CLINIC_OR_DEPARTMENT_OTHER): Payer: Self-pay

## 2020-11-06 MED ORDER — COVID-19 MRNA VAC-TRIS(PFIZER) 30 MCG/0.3ML IM SUSP
INTRAMUSCULAR | 0 refills | Status: DC
  Filled 2020-11-06: qty 0.3, 1d supply, fill #0

## 2020-11-14 NOTE — Progress Notes (Signed)
Subjective:   Leah Hampton is a 65 y.o. female who presents for Medicare Annual (Subsequent) preventive examination.  I connected with Cylah today by telephone and verified that I am speaking with the correct person using two identifiers. Location patient: home Location provider: work Persons participating in the virtual visit: patient, Marine scientist.    I discussed the limitations, risks, security and privacy concerns of performing an evaluation and management service by telephone and the availability of in person appointments. I also discussed with the patient that there may be a patient responsible charge related to this service. The patient expressed understanding and verbally consented to this telephonic visit.    Interactive audio and video telecommunications were attempted between this provider and patient, however failed, due to patient having technical difficulties OR patient did not have access to video capability.  We continued and completed visit with audio only.  Some vital signs may be absent or patient reported.   Time Spent with patient on telephone encounter: 20 minutes   Review of Systems    Cardiac Risk Factors include: diabetes mellitus;hypertension;dyslipidemia;obesity (BMI >30kg/m2)     Objective:    Today's Vitals   11/18/20 0940  Weight: 283 lb (128.4 kg)  Height: 5' 2"  (1.575 m)   Body mass index is 51.76 kg/m.  Advanced Directives 11/18/2020 10/07/2020 10/02/2020 08/18/2020 07/15/2020 02/20/2020 08/23/2019  Does Patient Have a Medical Advance Directive? No No No No No No No  Would patient like information on creating a medical advance directive? No - Patient declined - Yes (MAU/Ambulatory/Procedural Areas - Information given) No - Patient declined No - Patient declined Yes (MAU/Ambulatory/Procedural Areas - Information given) No - Patient declined    Current Medications (verified) Outpatient Encounter Medications as of 11/18/2020  Medication Sig   albuterol  (VENTOLIN HFA) 108 (90 Base) MCG/ACT inhaler USE 2 INHALATIONS BY MOUTH  EVERY 6 HOURS AS NEEDED FOR WHEEZING OR SHORTNESS OF  BREATH (Patient taking differently: Inhale 2 puffs into the lungs every 6 (six) hours as needed for wheezing (Cough).)   blood glucose meter kit and supplies KIT Dispense based on patient and insurance preference. Use up to four times daily as directed. (FOR ICD-9 250.00, 250.01).   diclofenac Sodium (VOLTAREN) 1 % GEL Apply 2 g topically 4 (four) times daily.   FLOVENT HFA 110 MCG/ACT inhaler USE 2 INHALATIONS BY MOUTH  TWICE DAILY (Patient taking differently: Inhale 2 puffs into the lungs 2 (two) times daily.)   losartan (COZAAR) 100 MG tablet Take 1 tablet (100 mg total) by mouth daily.   Multiple Vitamin (MULTIVITAMIN WITH MINERALS) TABS tablet Take 1 tablet by mouth daily. Vitafusion   omeprazole (PRILOSEC) 20 MG capsule TAKE 1 CAPSULE BY MOUTH  DAILY   ondansetron (ZOFRAN-ODT) 4 MG disintegrating tablet Take 1 tablet (4 mg total) by mouth every 6 (six) hours as needed for nausea or vomiting.   polyethylene glycol (MIRALAX / GLYCOLAX) 17 g packet Take 17 g by mouth daily as needed for moderate constipation or severe constipation.   pravastatin (PRAVACHOL) 80 MG tablet TAKE 1 TABLET BY MOUTH  DAILY   psyllium (METAMUCIL) 58.6 % powder Take 1 packet by mouth daily as needed (constipation).   triamcinolone cream (KENALOG) 0.1 % Apply 1 application topically 2 (two) times daily.   valACYclovir (VALTREX) 1000 MG tablet TAKE 1 TABLET BY MOUTH  DAILY (Patient taking differently: Take 1,000 mg by mouth daily.)   [DISCONTINUED] COVID-19 mRNA Vac-TriS, Pfizer, SUSP injection Inject into the muscle.  No facility-administered encounter medications on file as of 11/18/2020.    Allergies (verified) Sulfamethoxazole-trimethoprim [bactrim] and Codeine   History: Past Medical History:  Diagnosis Date   Bronchitis    Chronic bronchitis ([place])    Cystitis    Diabetes type 2,  controlled ([place])    Hematuria    urologice eval, Dr Estill Dooms   History of chest pain    Hyperbilirubinemia    Hyperlipemia    Hypertelorism    Hypertension    Leukocytosis    Obesity    Osteoarthritis    right knee   Sleep apnea 2012   slight    Vaginal Pap smear, abnormal    Vitamin D deficiency 02/09/2018   Past Surgical History:  Procedure Laterality Date   CERVICAL POLYPECTOMY  07/15/2020   Procedure: POLYPECTOMY with Myosure;  Surgeon: Lavonia Drafts, MD;  Location: [facility];  Service: Gynecology;;   COLONOSCOPY WITH PROPOFOL N/A 03/27/2015   Procedure: COLONOSCOPY WITH PROPOFOL;  Surgeon: Milus Banister, MD;  Location: [facility];  Service: Endoscopy;  Laterality: N/A;   COLONOSCOPY WITH PROPOFOL N/A 10/02/2020   Procedure: COLONOSCOPY WITH PROPOFOL;  Surgeon: Milus Banister, MD;  Location: [facility];  Service: Endoscopy;  Laterality: N/A;   COLPOSCOPY N/A 07/15/2020   Procedure: COLPOSCOPY;  Surgeon: Lavonia Drafts, MD;  Location: [facility];  Service: Gynecology;  Laterality: N/A;   DILATATION & CURETTAGE/HYSTEROSCOPY WITH MYOSURE N/A 01/05/2017   Procedure: DILATATION & CURETTAGE/HYSTEROSCOPY WITH MYOSURE;  Surgeon: Thurnell Lose, MD;  Location: [facility];  Service: Gynecology;  Laterality: N/A;  PostMenopausal Bleeding   HYSTEROSCOPY WITH D & C N/A 05/22/2019   Procedure: DILATATION AND CURETTAGE /HYSTEROSCOPY, POLYPECTOMY WITH MYOSURE;  Surgeon: Lavonia Drafts, MD;  Location: [facility];  Service: Gynecology;  Laterality: N/A;   HYSTEROSCOPY WITH D & C N/A 07/15/2020   Procedure: DILATATION AND CURETTAGE /HYSTEROSCOPY;  Surgeon: Lavonia Drafts, MD;  Location: [facility];  Service: Gynecology;  Laterality: N/A;   KNEE ARTHROSCOPY  04/2007   left knee   TOTAL KNEE ARTHROPLASTY Left 10/2008   Dr Roxine Caddy   UPPER GI ENDOSCOPY N/A 10/14/2020   Procedure: UPPER GI ENDOSCOPY;  Surgeon: [name], MD;  Location: [facility];  Service: General;  Laterality: N/A;    Family History  Problem Relation Age of Onset   Heart failure Mother    Arthritis Mother    Hypertension Mother    Stroke Mother    Diabetes Mellitus II Mother    Heart attack Father 55   Diabetes Other    Arthritis Other    Stroke Other    Coronary artery disease Other    Hypertension Sister        x 2   CVA Sister    CAD Sister 55   Diabetes Mellitus II Sister    Cirrhosis Brother    Alcohol abuse Brother    Colon cancer Neg Hx    Social History   Socioeconomic History   Marital status: Single    Spouse name: Not on file   Number of children: 2   Years of education: Not on file   Highest education level: Not on file  Occupational History   Occupation: disabled  Tobacco Use   Smoking status: Never   Smokeless tobacco: Never  Vaping Use   Vaping Use: Never used  Substance and Sexual Activity   Alcohol use: No   Drug use: No   Sexual activity: Not Currently  Other Topics Concern   Not  on file  Social History Narrative   Regular exercise:  No   Lives alone   Has 2 grown sons, both local.  1 grandson   On disability due to her knee pain   Completed 12th grade   Enjoys television   No pets.    Social Determinants of Health   Financial Resource Strain: Low Risk    Difficulty of Paying Living Expenses: Not hard at all  Food Insecurity: No Food Insecurity   Worried About Charity fundraiser in the Last Year: Never true   [place] in the Last Year: Never true  Transportation Needs: No Transportation Needs   Lack of Transportation (Medical): No   Lack of Transportation (Non-Medical): No  Physical Activity: Insufficiently Active   Days of Exercise per Week: 1 day   Minutes of Exercise per Session: 20 min  Stress: No Stress Concern Present   Feeling of Stress : Not at all  Social Connections: Moderately Isolated   Frequency of Communication with Friends and Family: More than three times a week   Frequency of Social Gatherings with Friends and  Family: Once a week   Attends Religious Services: 1 to 4 times per year   Active Member of [employer] or Organizations: No   Attends Music therapist: Never   Marital Status: Never married    Tobacco Counseling Counseling given: Not Answered   Clinical Intake:  Pre-visit preparation completed: Yes  Pain : No/denies pain     Nutritional Status: BMI > 30  Obese Nutritional Risks: None Diabetes: Yes CBG done?: No Did pt. bring in CBG monitor from home?: No (phone visit)  How often do you need to have someone help you when you read instructions, pamphlets, or other written materials from your doctor or pharmacy?: 1 - Never  Diabetes:  Is the patient diabetic?  Yes  If diabetic, was a CBG obtained today?  No  Did the patient bring in their glucometer from home?  No phone visit How often do you monitor your CBG's? never.   Financial Strains and Diabetes Management:  Are you having any financial strains with the device, your supplies or your medication? No .  Does the patient want to be seen by Chronic Care Management for management of their diabetes?  No  Would the patient like to be referred to a Nutritionist or for Diabetic Management?  No   Diabetic Exams:  Diabetic Eye Exam:. Overdue for diabetic eye exam. Pt has been advised about the importance in completing this exam. Patient states she has an appt this month  Diabetic Foot Exam: Completed 11/04/2020.    Interpreter Needed?: No  Information entered by :: Caroleen Hamman LPN   Activities of Daily Living In your present state of health, do you have any difficulty performing the following activities: 11/18/2020 10/15/2020  Hearing? N N  Vision? N N  Difficulty concentrating or making decisions? N N  Walking or climbing stairs? N Y  Dressing or bathing? N N  Doing errands, shopping? N N  Preparing Food and eating ? N -  Using the Toilet? N -  In the past six months, have you accidently leaked urine? N -   Do you have problems with loss of bowel control? N -  Managing your Medications? N -  Managing your Finances? N -  Housekeeping or managing your Housekeeping? N -  Some recent data might be hidden    Patient Care Team: Debbrah Alar,  NP as PCP - General (Internal Medicine) Debbrah Alar, NP as Nurse Practitioner (Internal Medicine)  Indicate any recent Medical Services you may have received from other than [facility] providers in the past year (date may be approximate).     Assessment:   This is a routine wellness examination for [place].  Hearing/Vision screen Hearing Screening - Comments:: No issues Vision Screening - Comments:: Reading glasses Last eye exam-2021-Dr. Groat  Dietary issues and exercise activities discussed: Current Exercise Habits: Home exercise routine, Type of exercise: walking, Time (Minutes): 15, Frequency (Times/Week): 1, Weekly Exercise (Minutes/Week): 15, Intensity: Mild, Exercise limited by: None identified   Goals Addressed             This Visit's Progress    Increase physical activity   Not on track      Depression Screen PHQ 2/9 Scores 11/18/2020 11/04/2020 09/15/2020 02/20/2020 07/24/2019 04/07/2018 01/04/2018  PHQ - 2 Score 0 1 0 0 0 - 3  PHQ- 9 Score - - - - - - 7  Exception Documentation - - - - - Patient refusal -    Fall Risk Fall Risk  11/18/2020 11/04/2020 02/20/2020 07/24/2019 06/22/2016  Falls in the past year? 1 1 0 0 No  Number falls in past yr: 0 0 - 0 -  Injury with Fall? 0 1 - 0 -  Risk for fall due to : History of fall(s) - - - -  Follow up Falls prevention discussed - - Education provided;Falls prevention discussed -    FALL RISK PREVENTION PERTAINING TO THE HOME:  Any stairs in or around the home? Yes  If so, are there any without handrails? No  Home free of loose throw rugs in walkways, pet beds, electrical cords, etc? Yes  Adequate lighting in your home to reduce risk of falls? Yes   ASSISTIVE DEVICES UTILIZED TO  PREVENT FALLS:  Life alert? No  Use of a cane, walker or w/c? No  Grab bars in the bathroom? No  Shower chair or bench in shower? Yes  Elevated toilet seat or a handicapped toilet? No   TIMED UP AND GO:  Was the test performed? No . Phone visit   Cognitive Function:Normal cognitive status assessed by this Nurse Health Advisor. No abnormalities found.          Immunizations Immunization History  Administered Date(s) Administered   Fluad Quad(high Dose 65+) 02/09/2019   Influenza Whole 02/24/2010   Influenza,inj,Quad PF,6+ Mos 05/01/2014, 03/22/2016, 02/16/2017, 01/04/2018, 01/23/2019, 03/14/2020   PFIZER Comirnaty(Gray Top)Covid-19 Tri-Sucrose Vaccine 11/04/2020   PFIZER(Purple Top)SARS-COV-2 Vaccination 08/17/2019, 09/12/2019, 03/14/2020   PNEUMOCOCCAL CONJUGATE-20 11/04/2020   Pneumococcal Polysaccharide-23 06/13/2014, 07/27/2019   Td 07/03/2008    TDAP status: Due, Education has been provided regarding the importance of this vaccine. Advised may receive this vaccine at local pharmacy or Health Dept. Aware to provide a copy of the vaccination record if obtained from local pharmacy or Health Dept. Verbalized acceptance and understanding.  Flu Vaccine status: Up to date  Pneumococcal vaccine status: Up to date  Covid-19 vaccine status: Completed vaccines  Qualifies for Shingles Vaccine? Yes   Zostavax completed No   Shingrix Completed?: No.    Education has been provided regarding the importance of this vaccine. Patient has been advised to call insurance company to determine out of pocket expense if they have not yet received this vaccine. Advised may also receive vaccine at local pharmacy or Health Dept. Verbalized acceptance and understanding.  Screening Tests Health Maintenance  Topic  Date Due   Pneumococcal Vaccine 57-82 Years old (1 - PCV) Never done   Zoster Vaccines- Shingrix (1 of 2) Never done   TETANUS/TDAP  07/03/2018   OPHTHALMOLOGY EXAM  06/21/2019    INFLUENZA VACCINE  12/15/2020   COVID-19 Vaccine (5 - Booster for Pfizer series) 03/06/2021   HEMOGLOBIN A1C  04/09/2021   FOOT EXAM  11/04/2021   MAMMOGRAM  11/25/2021   PAP SMEAR-Modifier  06/10/2023   COLONOSCOPY (Pts 45-8yr Insurance coverage will need to be confirmed)  10/03/2030   PNEUMOCOCCAL POLYSACCHARIDE VACCINE AGE 83-64 HIGH RISK  Completed   Hepatitis C Screening  Completed   HIV Screening  Completed   HPV VACCINES  Aged Out    Health Maintenance  Health Maintenance Due  Topic Date Due   Pneumococcal Vaccine 068638Years old (1 - PCV) Never done   Zoster Vaccines- Shingrix (1 of 2) Never done   TETANUS/TDAP  07/03/2018   OPHTHALMOLOGY EXAM  06/21/2019    Colorectal cancer screening: Type of screening: Colonoscopy. Completed 10/02/2020. Repeat every 10 years  Mammogram status: Scheduled for 12/25/2020  Bone Density status: Not yet indicated.  Lung Cancer Screening: (Low Dose CT Chest recommended if Age 63-80years, 30 pack-year currently smoking OR have quit w/in 15years.) does not qualify.     Additional Screening:  Hepatitis C Screening: Completed 04/14/2015  Vision Screening: Recommended annual ophthalmology exams for early detection of glaucoma and other disorders of the eye. Is the patient up to date with their annual eye exam?  No  Who is the provider or what is the name of the office in which the patient attends annual eye exams? Dr. GKaty Fitch  Dental Screening: Recommended annual dental exams for proper oral hygiene  Community Resource Referral / Chronic Care Management: CRR required this visit?  No   CCM required this visit?  No      Plan:     I have personally reviewed and noted the following in the patient's chart:   Medical and social history Use of alcohol, tobacco or illicit drugs  Current medications and supplements including opioid prescriptions.  Functional ability and status Nutritional status Physical activity Advanced  directives List of other physicians Hospitalizations, surgeries, and ER visits in previous 12 months Vitals Screenings to include cognitive, depression, and falls Referrals and appointments  In addition, I have reviewed and discussed with patient certain preventive protocols, quality metrics, and best practice recommendations. A written personalized care plan for preventive services as well as general preventive health recommendations were provided to patient.    Due to this being a telephonic visit, the after visit summary with patients personalized plan was offered to patient via mail or my-chart. Per request, patient was mailed a copy of [place] LPN   [id number] Nurse Health Advisor  Nurse Notes: None

## 2020-11-18 ENCOUNTER — Ambulatory Visit (INDEPENDENT_AMBULATORY_CARE_PROVIDER_SITE_OTHER): Payer: Medicare Other

## 2020-11-18 VITALS — Ht 62.0 in | Wt 283.0 lb

## 2020-11-18 DIAGNOSIS — Z0000 Encounter for general adult medical examination without abnormal findings: Secondary | ICD-10-CM

## 2020-11-18 NOTE — Patient Instructions (Signed)
Leah Hampton , Thank you for taking time to complete your Medicare Wellness Visit. I appreciate your ongoing commitment to your health goals. Please review the following plan we discussed and let me know if I can assist you in the future.   Screening recommendations/referrals: Colonoscopy: Completed 10/02/2020-Due 10/03/2030 Mammogram: Scheduled for 12/25/2020 Bone Density: Not yet indicated. Due at age 65 Recommended yearly ophthalmology/optometry visit for glaucoma screening and checkup Recommended yearly dental visit for hygiene and checkup  Vaccinations: Influenza vaccine: Up to date Pneumococcal vaccine: Up to date Tdap vaccine: Discuss with pharmacy Shingles vaccine: Discuss with pharmacy  Covid-19: Up to date  Advanced directives: Declined information  Conditions/risks identified: See problem list  Next appointment: Follow up in one year for your annual wellness visit. 11/24/2021 @ 9:40  Preventive Care 40-64 Years, Female Preventive care refers to lifestyle choices and visits with your health care provider that can promote health and wellness. What does preventive care include? A yearly physical exam. This is also called an annual well check. Dental exams once or twice a year. Routine eye exams. Ask your health care provider how often you should have your eyes checked. Personal lifestyle choices, including: Daily care of your teeth and gums. Regular physical activity. Eating a healthy diet. Avoiding tobacco and drug use. Limiting alcohol use. Practicing safe sex. Taking low-dose aspirin daily starting at age 76. Taking vitamin and mineral supplements as recommended by your health care provider. What happens during an annual well check? The services and screenings done by your health care provider during your annual well check will depend on your age, overall health, lifestyle risk factors, and family history of disease. Counseling  Your health care provider may ask you  questions about your: Alcohol use. Tobacco use. Drug use. Emotional well-being. Home and relationship well-being. Sexual activity. Eating habits. Work and work Statistician. Method of birth control. Menstrual cycle. Pregnancy history. Screening  You may have the following tests or measurements: Height, weight, and BMI. Blood pressure. Lipid and cholesterol levels. These may be checked every 5 years, or more frequently if you are over 39 years old. Skin check. Lung cancer screening. You may have this screening every year starting at age 44 if you have a 30-pack-year history of smoking and currently smoke or have quit within the past 15 years. Fecal occult blood test (FOBT) of the stool. You may have this test every year starting at age 17. Flexible sigmoidoscopy or colonoscopy. You may have a sigmoidoscopy every 5 years or a colonoscopy every 10 years starting at age 49. Hepatitis C blood test. Hepatitis B blood test. Sexually transmitted disease (STD) testing. Diabetes screening. This is done by checking your blood sugar (glucose) after you have not eaten for a while (fasting). You may have this done every 1-3 years. Mammogram. This may be done every 1-2 years. Talk to your health care provider about when you should start having regular mammograms. This may depend on whether you have a family history of breast cancer. BRCA-related cancer screening. This may be done if you have a family history of breast, ovarian, tubal, or peritoneal cancers. Pelvic exam and Pap test. This may be done every 3 years starting at age 20. Starting at age 78, this may be done every 5 years if you have a Pap test in combination with an HPV test. Bone density scan. This is done to screen for osteoporosis. You may have this scan if you are at high risk for osteoporosis. Discuss your test  results, treatment options, and if necessary, the need for more tests with your health care provider. Vaccines  Your health  care provider may recommend certain vaccines, such as: Influenza vaccine. This is recommended every year. Tetanus, diphtheria, and acellular pertussis (Tdap, Td) vaccine. You may need a Td booster every 10 years. Zoster vaccine. You may need this after age 18. Pneumococcal 13-valent conjugate (PCV13) vaccine. You may need this if you have certain conditions and were not previously vaccinated. Pneumococcal polysaccharide (PPSV23) vaccine. You may need one or two doses if you smoke cigarettes or if you have certain conditions. Talk to your health care provider about which screenings and vaccines you need and how often you need them. This information is not intended to replace advice given to you by your health care provider. Make sure you discuss any questions you have with your health care provider. Document Released: 05/30/2015 Document Revised: 01/21/2016 Document Reviewed: 03/04/2015 Elsevier Interactive Patient Education  2017 Trail Prevention in the Home Falls can cause injuries. They can happen to people of all ages. There are many things you can do to make your home safe and to help prevent falls. What can I do on the outside of my home? Regularly fix the edges of walkways and driveways and fix any cracks. Remove anything that might make you trip as you walk through a door, such as a raised step or threshold. Trim any bushes or trees on the path to your home. Use bright outdoor lighting. Clear any walking paths of anything that might make someone trip, such as rocks or tools. Regularly check to see if handrails are loose or broken. Make sure that both sides of any steps have handrails. Any raised decks and porches should have guardrails on the edges. Have any leaves, snow, or ice cleared regularly. Use sand or salt on walking paths during winter. Clean up any spills in your garage right away. This includes oil or grease spills. What can I do in the bathroom? Use  night lights. Install grab bars by the toilet and in the tub and shower. Do not use towel bars as grab bars. Use non-skid mats or decals in the tub or shower. If you need to sit down in the shower, use a plastic, non-slip stool. Keep the floor dry. Clean up any water that spills on the floor as soon as it happens. Remove soap buildup in the tub or shower regularly. Attach bath mats securely with double-sided non-slip rug tape. Do not have throw rugs and other things on the floor that can make you trip. What can I do in the bedroom? Use night lights. Make sure that you have a light by your bed that is easy to reach. Do not use any sheets or blankets that are too big for your bed. They should not hang down onto the floor. Have a firm chair that has side arms. You can use this for support while you get dressed. Do not have throw rugs and other things on the floor that can make you trip. What can I do in the kitchen? Clean up any spills right away. Avoid walking on wet floors. Keep items that you use a lot in easy-to-reach places. If you need to reach something above you, use a strong step stool that has a grab bar. Keep electrical cords out of the way. Do not use floor polish or wax that makes floors slippery. If you must use wax, use non-skid floor  wax. Do not have throw rugs and other things on the floor that can make you trip. What can I do with my stairs? Do not leave any items on the stairs. Make sure that there are handrails on both sides of the stairs and use them. Fix handrails that are broken or loose. Make sure that handrails are as long as the stairways. Check any carpeting to make sure that it is firmly attached to the stairs. Fix any carpet that is loose or worn. Avoid having throw rugs at the top or bottom of the stairs. If you do have throw rugs, attach them to the floor with carpet tape. Make sure that you have a light switch at the top of the stairs and the bottom of the  stairs. If you do not have them, ask someone to add them for you. What else can I do to help prevent falls? Wear shoes that: Do not have high heels. Have rubber bottoms. Are comfortable and fit you well. Are closed at the toe. Do not wear sandals. If you use a stepladder: Make sure that it is fully opened. Do not climb a closed stepladder. Make sure that both sides of the stepladder are locked into place. Ask someone to hold it for you, if possible. Clearly mark and make sure that you can see: Any grab bars or handrails. First and last steps. Where the edge of each step is. Use tools that help you move around (mobility aids) if they are needed. These include: Canes. Walkers. Scooters. Crutches. Turn on the lights when you go into a dark area. Replace any light bulbs as soon as they burn out. Set up your furniture so you have a clear path. Avoid moving your furniture around. If any of your floors are uneven, fix them. If there are any pets around you, be aware of where they are. Review your medicines with your doctor. Some medicines can make you feel dizzy. This can increase your chance of falling. Ask your doctor what other things that you can do to help prevent falls. This information is not intended to replace advice given to you by your health care provider. Make sure you discuss any questions you have with your health care provider. Document Released: 02/27/2009 Document Revised: 10/09/2015 Document Reviewed: 06/07/2014 Elsevier Interactive Patient Education  2017 [employer].

## 2020-11-28 ENCOUNTER — Ambulatory Visit (INDEPENDENT_AMBULATORY_CARE_PROVIDER_SITE_OTHER): Payer: Medicare Other | Admitting: Obstetrics & Gynecology

## 2020-11-28 ENCOUNTER — Ambulatory Visit (HOSPITAL_BASED_OUTPATIENT_CLINIC_OR_DEPARTMENT_OTHER)
Admission: RE | Admit: 2020-11-28 | Discharge: 2020-11-28 | Disposition: A | Discharge status: Home / Self Care | Payer: Medicare Other | Source: Ambulatory Visit | Attending: Family Medicine | Admitting: Family Medicine

## 2020-11-28 ENCOUNTER — Encounter: Payer: Self-pay | Admitting: Obstetrics & Gynecology

## 2020-11-28 ENCOUNTER — Other Ambulatory Visit: Payer: Self-pay

## 2020-11-28 VITALS — BP 128/68 | HR 87 | Wt 282.0 lb

## 2020-11-28 DIAGNOSIS — M25552 Pain in left hip: Secondary | ICD-10-CM | POA: Diagnosis not present

## 2020-11-28 DIAGNOSIS — M160 Bilateral primary osteoarthritis of hip: Secondary | ICD-10-CM | POA: Insufficient documentation

## 2020-11-28 DIAGNOSIS — N950 Postmenopausal bleeding: Secondary | ICD-10-CM | POA: Diagnosis not present

## 2020-11-28 NOTE — Progress Notes (Signed)
History:  65 y.o. G2P2000 here today for f/u of history of PMPB. Pt reports that she has had no further bleeding. She did see some blood with wiping after her obesity surgery.  She denies pelvic pain or other GYN sx.  She reports 20# weight loss since procedure.     The following portions of the patient's history were reviewed and updated as appropriate: allergies, current medications, past family history, past medical history, past social history, past surgical history and problem list.  Review of Systems:  Pertinent items are noted in HPI.    Objective:  Physical Exam Blood pressure 128/68, pulse 87, weight 282 lb (127.9 kg), last menstrual period 11/10/2011.  CONSTITUTIONAL: Well-developed, well-nourished female in no acute distress.  HENT:  Normocephalic, atraumatic EYES: Conjunctivae and EOM are normal. No scleral icterus.  NECK: Normal range of motion SKIN: Skin is warm and dry. No rash noted. Not diaphoretic.No pallor. [place]: Alert and oriented to person, place, and time. Normal coordination.  Pelvic: not indicated   Labs and Imaging No results found.  Assessment & Plan:  PMPB. Resolved. Blood with wiping after surgery likely from placement of the foley.  Rec f/u in 1 year or sooner prn any vaginal bleeding   Antonyo Hinderer L. Harraway-Smith, M.D., [name]

## 2020-12-01 ENCOUNTER — Telehealth: Payer: Self-pay | Admitting: Family Medicine

## 2020-12-01 NOTE — Telephone Encounter (Signed)
Informed of results.   Rosemarie Ax, MD [facility] 12/01/2020, 2:56 PM

## 2020-12-02 ENCOUNTER — Telehealth (INDEPENDENT_AMBULATORY_CARE_PROVIDER_SITE_OTHER): Payer: Medicare Other | Admitting: Family

## 2020-12-02 ENCOUNTER — Telehealth: Payer: Self-pay | Admitting: Family

## 2020-12-02 VITALS — BP 148/99

## 2020-12-02 DIAGNOSIS — I10 Essential (primary) hypertension: Secondary | ICD-10-CM | POA: Diagnosis not present

## 2020-12-02 MED ORDER — AMLODIPINE BESYLATE 5 MG PO TABS: 5.0000 mg | ORAL_TABLET | Freq: Every day | ORAL | 0 refills | Status: DC

## 2020-12-02 MED ORDER — LOSARTAN POTASSIUM 100 MG PO TABS: 100.0000 mg | ORAL_TABLET | Freq: Every day | ORAL | 1 refills | Status: DC

## 2020-12-02 MED ORDER — LOSARTAN POTASSIUM 100 MG PO TABS: 100.0000 mg | ORAL_TABLET | Freq: Every day | ORAL | 0 refills | Status: DC

## 2020-12-02 NOTE — Patient Instructions (Signed)
Please add amlodipine 5mg once daily.  

## 2020-12-02 NOTE — Telephone Encounter (Signed)
Please call pt to schedule a virtual visit with me in 1 month. BP follow up.

## 2020-12-02 NOTE — Progress Notes (Signed)
MyChart Video Visit    Virtual Visit via Video Note   This visit type was conducted due to national recommendations for restrictions regarding the COVID-19 Pandemic (e.g. social distancing) in an effort to limit this patient's exposure and mitigate transmission in our community. This patient is at least at moderate risk for complications without adequate follow up. This format is felt to be most appropriate for this patient at this time. Physical exam was limited by quality of the video and audio technology used for the visit. CMA was able to get the patient set up on a video visit.  Patient location: Home. Patient and provider in visit Provider location: Office  I discussed the limitations of evaluation and management by telemedicine and the availability of in person appointments. The patient expressed understanding and agreed to proceed.  Visit Date: 12/02/2020  Today's healthcare provider: [name], NP     Subjective:    Patient ID: Leah Hampton, female    DOB: [birth year], 65 y.o.   MRN: [id number]  Chief Complaint  Patient presents with   Hypertension    HPI  HTN- last visit bp was elevated. We d/c'd losartan 50-hctz. Instead we increased losartan to 135m.  Pt reports bp readings as below on the new dose of losartan.   156/96 on Sunday, yesterday 143/100, today 152/104 and 148/99 Past Medical History:  Diagnosis Date   Bronchitis    Chronic bronchitis (HCC)    Cystitis    Diabetes type 2, controlled ([facility]    Hematuria    urologice eval, Dr EEstill Dooms  History of chest pain    Hyperbilirubinemia    Hyperlipemia    Hypertelorism    Hypertension    Leukocytosis    Obesity    Osteoarthritis    right knee   Sleep apnea 2012   slight    Vaginal Pap smear, abnormal    Vitamin D deficiency 02/09/2018    Past Surgical History:  Procedure Laterality Date   CERVICAL POLYPECTOMY  07/15/2020   Procedure: POLYPECTOMY with Myosure;  Surgeon:  HLavonia Drafts MD;  [place]  Service: Gynecology;;   COLONOSCOPY WITH PROPOFOL N/A 03/27/2015   Procedure: COLONOSCOPY WITH PROPOFOL;  Surgeon: DMilus Banister MD;  [facility];  Service: Endoscopy;  Laterality: N/A;   COLONOSCOPY WITH PROPOFOL N/A 10/02/2020   Procedure: COLONOSCOPY WITH PROPOFOL;  Surgeon: JMilus Banister MD;  [facility]: [facility];  Service: Endoscopy;  Laterality: N/A;   COLPOSCOPY N/A 07/15/2020   Procedure: COLPOSCOPY;  Surgeon: HLavonia Drafts MD;  [facility] [facility]  Service: Gynecology;  Laterality: N/A;   DILATATION & CURETTAGE/HYSTEROSCOPY WITH MYOSURE N/A 01/05/2017   Procedure: DILATATION & CURETTAGE/HYSTEROSCOPY WITH MYOSURE;  Surgeon: VThurnell Lose MD;  [facility];  Service: Gynecology;  Laterality: N/A;  PostMenopausal Bleeding   HYSTEROSCOPY WITH D & C N/A 05/22/2019   Procedure: DILATATION AND CURETTAGE /HYSTEROSCOPY, POLYPECTOMY WITH MYOSURE;  Surgeon: HLavonia Drafts MD;  [facility];  Service: Gynecology;  Laterality: N/A;   HYSTEROSCOPY WITH D & C N/A 07/15/2020   Procedure: DILATATION AND CURETTAGE /HYSTEROSCOPY;  Surgeon: HLavonia Drafts MD;  [place] [facility] [facility]  Service: Gynecology;  Laterality: N/A;   KNEE ARTHROSCOPY  04/2007   left knee   TOTAL KNEE ARTHROPLASTY Left 10/2008   Dr CRoxine Caddy  UPPER GI ENDOSCOPY N/A 10/14/2020   Procedure: UPPER GI ENDOSCOPY;  Surgeon: WGreer Pickerel MD;  Location: [facility];  Service: General;  Laterality: N/A;  Family History  Problem Relation Age of Onset   Heart failure Mother    Arthritis Mother    Hypertension Mother    Stroke Mother    Diabetes Mellitus II Mother    Heart attack Father 28   Diabetes Other    Arthritis Other    Stroke Other    Coronary artery disease Other    Hypertension Sister        x 2   CVA Sister    CAD Sister 17   Diabetes Mellitus II Sister    Cirrhosis Brother    Alcohol abuse Brother    Colon cancer Neg Hx      Social History   Socioeconomic History   Marital status: Single    Spouse name: Not on file   Number of children: 2   Years of education: Not on file   Highest education level: Not on file  Occupational History   Occupation: disabled  Tobacco Use   Smoking status: Never   Smokeless tobacco: Never  Vaping Use   Vaping Use: Never used  Substance and Sexual Activity   Alcohol use: No   Drug use: No   Sexual activity: Not Currently  Other Topics Concern   Not on file  Social History Narrative   Regular exercise:  No   Lives alone   Has 2 grown sons, both local.  1 grandson   On disability due to her knee pain   Completed 12th grade   Enjoys television   No pets.    Social Determinants of Health   Financial Resource Strain: Low Risk    Difficulty of Paying Living Expenses: Not hard at all  Food Insecurity: No Food Insecurity   Worried About Charity fundraiser in the Last Year: Never true   [place] in the Last Year: Never true  Transportation Needs: No Transportation Needs   Lack of Transportation (Medical): No   Lack of Transportation (Non-Medical): No  Physical Activity: Insufficiently Active   Days of Exercise per Week: 1 day   Minutes of Exercise per Session: 20 min  Stress: No Stress Concern Present   Feeling of Stress : Not at all  Social Connections: Moderately Isolated   Frequency of Communication with Friends and Family: More than three times a week   Frequency of Social Gatherings with Friends and Family: Once a week   Attends Religious Services: 1 to 4 times per year   Active Member of [employer] or Organizations: No   Attends Archivist Meetings: Never   Marital Status: Never married  Human resources officer Violence: Not At Risk   Fear of Current or Ex-Partner: No   Emotionally Abused: No   Physically Abused: No   Sexually Abused: No    Outpatient Medications Prior to Visit  Medication Sig Dispense Refill   albuterol (VENTOLIN HFA) 108  (90 Base) MCG/ACT inhaler USE 2 INHALATIONS BY MOUTH  EVERY 6 HOURS AS NEEDED FOR WHEEZING OR SHORTNESS OF  BREATH (Patient taking differently: Inhale 2 puffs into the lungs every 6 (six) hours as needed for wheezing (Cough).) 34 g 3   blood glucose meter kit and supplies KIT Dispense based on patient and insurance preference. Use up to four times daily as directed. (FOR ICD-9 250.00, 250.01). 1 each 0   diclofenac Sodium (VOLTAREN) 1 % GEL Apply 2 g topically 4 (four) times daily. 100 g 1   FLOVENT HFA 110 MCG/ACT inhaler USE 2 INHALATIONS BY  MOUTH  TWICE DAILY (Patient taking differently: Inhale 2 puffs into the lungs 2 (two) times daily.) 36 g 1   Multiple Vitamin (MULTIVITAMIN WITH MINERALS) TABS tablet Take 1 tablet by mouth daily. Vitafusion     omeprazole (PRILOSEC) 20 MG capsule TAKE 1 CAPSULE BY MOUTH  DAILY 90 capsule 3   ondansetron (ZOFRAN-ODT) 4 MG disintegrating tablet Take 1 tablet (4 mg total) by mouth every 6 (six) hours as needed for nausea or vomiting. 20 tablet 0   polyethylene glycol (MIRALAX / GLYCOLAX) 17 g packet Take 17 g by mouth daily as needed for moderate constipation or severe constipation.     pravastatin (PRAVACHOL) 80 MG tablet TAKE 1 TABLET BY MOUTH  DAILY 90 tablet 1   psyllium (METAMUCIL) 58.6 % powder Take 1 packet by mouth daily as needed (constipation).     triamcinolone cream (KENALOG) 0.1 % Apply 1 application topically 2 (two) times daily. 30 g 0   valACYclovir (VALTREX) 1000 MG tablet TAKE 1 TABLET BY MOUTH  DAILY (Patient taking differently: Take 1,000 mg by mouth daily.) 90 tablet 3   losartan (COZAAR) 100 MG tablet Take 1 tablet (100 mg total) by mouth daily. 30 tablet 0   No facility-administered medications prior to visit.    Allergies  Allergen Reactions   Sulfamethoxazole-Trimethoprim [Bactrim] Itching   Codeine Nausea And Vomiting and Anxiety    ROS See HPI    Objective:    Physical Exam  BP (!) 148/99   LMP 11/10/2011  Wt Readings  from Last 3 Encounters:  11/28/20 282 lb (127.9 kg)  11/18/20 283 lb (128.4 kg)  11/04/20 286 lb (129.7 kg)    Gen: Awake, alert, no acute distress Resp: Breathing is even and non-labored Psych: calm/pleasant demeanor Neuro: Alert and Oriented x 3, + facial symmetry, speech is clear.     Assessment & Plan:   Problem List Items Addressed This Visit       Unprioritized   Essential hypertension    Uncontrolled on losartan 161m once daily. Will add amlodipine 540monce daily. She will go to [facility] to have lab work drawn- need to recheck bmet.        Relevant Medications   amLODipine (NORVASC) 5 MG tablet   losartan (COZAAR) 100 MG tablet   Other Visit Diagnoses     Primary hypertension    -  Primary   Relevant Medications   amLODipine (NORVASC) 5 MG tablet   losartan (COZAAR) 100 MG tablet   Other Relevant Orders   Basic metabolic panel       I am having Amadi M. Honeycutt start on amLODipine. I am also having her maintain her blood glucose meter kit and supplies, multivitamin with minerals, psyllium, albuterol, polyethylene glycol, Flovent HFA, valACYclovir, triamcinolone cream, diclofenac Sodium, ondansetron, omeprazole, pravastatin, and losartan.  Meds ordered this encounter  Medications   amLODipine (NORVASC) 5 MG tablet    Sig: Take 1 tablet (5 mg total) by mouth daily.    Dispense:  90 tablet    Refill:  0    Order Specific Question:   Supervising Provider    Answer:   BLPenni Homans [[id number]]   DISCONTD: losartan (COZAAR) 100 MG tablet    Sig: Take 1 tablet (100 mg total) by mouth daily.    Dispense:  90 tablet    Refill:  1    Order Specific Question:   Supervising Provider    Answer:   BLPenni Homans [[id number]]  losartan (COZAAR) 100 MG tablet    Sig: Take 1 tablet (100 mg total) by mouth daily.    Dispense:  90 tablet    Refill:  0    Order Specific Question:   Supervising Provider    Answer:   Penni Homans A [[id number]]    I discussed the assessment and  treatment plan with the patient. The patient was provided an opportunity to ask questions and all were answered. The patient agreed with the plan and demonstrated an understanding of the instructions.   The patient was advised to call back or seek an in-person evaluation if the symptoms worsen or if the condition fails to improve as anticipated.  [name], NP [employer] [phone] (phone) [phone] (fax)  [place]

## 2020-12-02 NOTE — Assessment & Plan Note (Signed)
Uncontrolled on losartan 100mg  once daily. Will add amlodipine 5mg  once daily. She will go to [facility] to have lab work drawn- need to recheck bmet.

## 2020-12-03 NOTE — Telephone Encounter (Signed)
Appt made for 08/22

## 2020-12-09 ENCOUNTER — Other Ambulatory Visit: Payer: Self-pay

## 2020-12-09 ENCOUNTER — Encounter: Payer: Medicare Other | Attending: General Surgery | Admitting: Skilled Nursing Facility1

## 2020-12-09 DIAGNOSIS — E119 Type 2 diabetes mellitus without complications: Secondary | ICD-10-CM | POA: Insufficient documentation

## 2020-12-09 NOTE — Progress Notes (Signed)
Bariatric Nutrition Follow-Up Visit Medical Nutrition Therapy   2 Months Post-Operative sleeve Surgery Surgery Date: 10/14/2020  NUTRITION ASSESSMENT    Anthropometrics  Start weight at [facility]: 312 lbs (date: 02/20/2020) Today's weight: 278.6 lbs   Clinical  Medical hx: OSA, DM, HTN Medications:  see list, omeprezole Labs:    Lifestyle & Dietary Hx  Pt states she takes omeprazole daily.   Pt states she is moving into a new apartment by September which has a gym.  Pt state she no longer adds grease to her beans using broth instead.  Pt states he might be interested in BELT.  Estimated daily fluid intake: 50 oz Estimated daily protein intake: 60+ g Supplements: multi (approved) and calcium Current average weekly physical activity: some walking some peddling stating she is not consistent but is putting in the effort to female changes    24-Hr Dietary Recall First Meal: protein shake (mainly to get rid of them) Snack:  imitatain crab meat Second Meal: cheese omolet + shrimp or salmon Snack:  cheese  Third Meal: beans + meat or beans + chili Snack: sugar free popcicle or jello Beverages: water, water + flavorings   Post-Op Goals/ Signs/ Symptoms Using straws: no Drinking while eating: no Chewing/swallowing difficulties: no Changes in vision: no Changes to mood/headaches: no Hair loss/changes to skin/nails: no Difficulty focusing/concentrating: no Sweating: no Dizziness/lightheadedness: no Palpitations: no  Carbonated/caffeinated beverages: no N/V/D/C/Gas: using metamucil daily, bowel movement daily Abdominal pain: no Dumping syndrome: no    NUTRITION DIAGNOSIS  Overweight/obesity ([place]-3.3) related to past poor dietary habits and physical inactivity as evidenced by completed bariatric surgery and following dietary guidelines for continued weight loss and healthy nutrition status.     NUTRITION INTERVENTION Nutrition counseling (C-1) and education (E-2) to facilitate  bariatric surgery goals, including: Diet advancement to the next phase (phase 4) now including non starchy vegetables The importance of consuming adequate calories as well as certain nutrients daily due to the body's need for essential vitamins, minerals, and fats The importance of daily physical activity and to reach a goal of at least 150 minutes of moderate to vigorous physical activity weekly (or as directed by their physician) due to benefits such as increased musculature and improved lab values The importance of intuitive eating specifically learning hunger-satiety cues and understanding the importance of learning a new body: The importance of mindful eating to avoid grazing behaviors   Goals: -Continue to aim for a minimum of 64 fluid ounces 7 days a week with at least 30 ounces being plain water  -Eat non-starchy vegetables 2 times a day 7 days a week  -Start out with soft cooked vegetables today and tomorrow; if tolerated begin to eat raw vegetables or cooked including salads  -Eat your 3 ounces of protein first then start in on your non-starchy vegetables; once you understand how much of your meal leads to satisfaction and not full while still eating 3 ounces of protein and non-starchy vegetables you can eat them in any order   -Continue to aim for 30 minutes of activity at least 5 times a week  -Do NOT cook with/add to your food: alfredo sauce, cheese sauce, barbeque sauce, ketchup, fat back, butter, bacon grease, grease, Crisco, OR SUGAR   Handouts Provided Include  Phase 4  Learning Style & Readiness for Change Teaching method utilized: Visual & Auditory  Demonstrated degree of understanding via: Teach Back  Readiness Level: action Barriers to learning/adherence to lifestyle change: none identified   RD's  Notes for Next Visit Assess adherence to pt chosen goals    MONITORING & EVALUATION Dietary intake, weekly physical activity, body weight  Next Steps Patient is to  follow-up in 3-4 months

## 2020-12-17 ENCOUNTER — Other Ambulatory Visit (INDEPENDENT_AMBULATORY_CARE_PROVIDER_SITE_OTHER): Payer: Medicare Other

## 2020-12-17 DIAGNOSIS — I10 Essential (primary) hypertension: Secondary | ICD-10-CM | POA: Diagnosis not present

## 2020-12-17 LAB — BASIC METABOLIC PANEL
BUN: 16 mg/dL (ref 6–23)
CO2: 24 mEq/L (ref 19–32)
Calcium: 9.7 mg/dL (ref 8.4–10.5)
Chloride: 107 mEq/L (ref 96–112)
Creatinine, Ser: 0.74 mg/dL (ref 0.40–1.20)
GFR: 86.23 mL/min (ref >60.00)
Glucose, Bld: 101 mg/dL — ABNORMAL HIGH (ref 70–99)
Potassium: 3.5 mEq/L (ref 3.5–5.1)
Sodium: 142 mEq/L (ref 135–145)

## 2020-12-25 ENCOUNTER — Other Ambulatory Visit: Payer: Self-pay

## 2020-12-25 ENCOUNTER — Ambulatory Visit
Admission: RE | Admit: 2020-12-25 | Discharge: 2020-12-25 | Disposition: A | Discharge status: Home / Self Care | Payer: Medicare Other | Source: Ambulatory Visit | Attending: Family | Admitting: Family

## 2020-12-25 DIAGNOSIS — Z1231 Encounter for screening mammogram for malignant neoplasm of breast: Secondary | ICD-10-CM | POA: Diagnosis not present

## 2020-12-26 ENCOUNTER — Other Ambulatory Visit: Payer: Self-pay | Admitting: Family

## 2020-12-29 ENCOUNTER — Ambulatory Visit: Payer: Medicare Other | Admitting: Podiatry

## 2021-01-05 ENCOUNTER — Other Ambulatory Visit: Payer: Self-pay

## 2021-01-05 ENCOUNTER — Telehealth (INDEPENDENT_AMBULATORY_CARE_PROVIDER_SITE_OTHER): Payer: Medicare Other | Admitting: Family

## 2021-01-05 DIAGNOSIS — I10 Essential (primary) hypertension: Secondary | ICD-10-CM | POA: Diagnosis not present

## 2021-01-05 NOTE — Progress Notes (Signed)
Virtual telephone visit    Virtual Visit via Telephone Note   This visit type was conducted due to national recommendations for restrictions regarding the COVID-19 Pandemic (e.g. social distancing) in an effort to limit this patient's exposure and mitigate transmission in our community. Due to her co-morbid illnesses, this patient is at least at moderate risk for complications without adequate follow up. This format is felt to be most appropriate for this patient at this time. The patient did not have access to video technology or had technical difficulties with video requiring transitioning to audio format only (telephone). Physical exam was limited to content and character of the telephone converstion. CMA was able to get the patient set up on a telephone visit.   Patient location: Home Patient and provider in visit Provider location: Office  I discussed the limitations of evaluation and management by telemedicine and the availability of in person appointments. The patient expressed understanding and agreed to proceed.   Visit Date: 01/05/2021  Today's healthcare provider: [name], NP     Subjective:    Patient ID: Leah Hampton, female    DOB: 10-12-1955, 65 y.o.   MRN: [id number]  Chief Complaint  Patient presents with   Hypertension    Follow up. BP yesterday was 137/80 per patient (not able to check today)    Hypertension  Patient is in today for a telephone visit.  Blood pressure- She started taking 5 mg amlodipine daily PO and reports no new issues while taking it. She measured her blood pressure today and reports a measurement of 137/80.   BP Readings from Last 3 Encounters:  12/02/20 (!) 148/99  11/28/20 128/68  11/04/20 (!) 108/57   Biotin- She is planning to start taking biotin.   Past Medical History:  Diagnosis Date   Bronchitis    Chronic bronchitis ([place])    Cystitis    Diabetes type 2, controlled ([place])    Hematuria    urologice  eval, Dr Estill Dooms   History of chest pain    Hyperbilirubinemia    Hyperlipemia    Hypertelorism    Hypertension    Leukocytosis    Obesity    Osteoarthritis    right knee   Sleep apnea 2012   slight    Vaginal Pap smear, abnormal    Vitamin D deficiency 02/09/2018    Past Surgical History:  Procedure Laterality Date   CERVICAL POLYPECTOMY  07/15/2020   Procedure: POLYPECTOMY with Myosure;  Surgeon: Lavonia Drafts, MD;  Location: [place];  Service: Gynecology;;   COLONOSCOPY WITH PROPOFOL N/A 03/27/2015   Procedure: COLONOSCOPY WITH PROPOFOL;  Surgeon: Milus Banister, MD;  Location: [facility];  Service: Endoscopy;  Laterality: N/A;   COLONOSCOPY WITH PROPOFOL N/A 10/02/2020   Procedure: COLONOSCOPY WITH PROPOFOL;  Surgeon: Milus Banister, MD;  Location: [facility];  Service: Endoscopy;  Laterality: N/A;   COLPOSCOPY N/A 07/15/2020   Procedure: COLPOSCOPY;  Surgeon: Lavonia Drafts, MD;  Location: [facility];  Service: Gynecology;  Laterality: N/A;   DILATATION & CURETTAGE/HYSTEROSCOPY WITH MYOSURE N/A 01/05/2017   Procedure: DILATATION & CURETTAGE/HYSTEROSCOPY WITH MYOSURE;  Surgeon: Thurnell Lose, MD;  Location: [facility];  Service: Gynecology;  Laterality: N/A;  PostMenopausal Bleeding   HYSTEROSCOPY WITH D & C N/A 05/22/2019   Procedure: DILATATION AND CURETTAGE /HYSTEROSCOPY, POLYPECTOMY WITH MYOSURE;  Surgeon: Lavonia Drafts, MD;  Location: [facility];  Service: Gynecology;  Laterality: N/A;   HYSTEROSCOPY WITH D & C N/A 07/15/2020  Procedure: DILATATION AND CURETTAGE /HYSTEROSCOPY;  Surgeon: Lavonia Drafts, MD;  Location: [facility];  Service: Gynecology;  Laterality: N/A;   KNEE ARTHROSCOPY  04/2007   left knee   TOTAL KNEE ARTHROPLASTY Left 10/2008   Dr Roxine Caddy   UPPER GI ENDOSCOPY N/A 10/14/2020   Procedure: UPPER GI ENDOSCOPY;  Surgeon: [name], MD;  Location: [facility];  Service: General;  Laterality: N/A;    Family History  Problem Relation Age of  Onset   Heart failure Mother    Arthritis Mother    Hypertension Mother    Stroke Mother    Diabetes Mellitus II Mother    Heart attack Father 34   Diabetes Other    Arthritis Other    Stroke Other    Coronary artery disease Other    Hypertension Sister        x 2   CVA Sister    CAD Sister 47   Diabetes Mellitus II Sister    Cirrhosis Brother    Alcohol abuse Brother    Colon cancer Neg Hx     Social History   Socioeconomic History   Marital status: Single    Spouse name: Not on file   Number of children: 2   Years of education: Not on file   Highest education level: Not on file  Occupational History   Occupation: disabled  Tobacco Use   Smoking status: Never   Smokeless tobacco: Never  Vaping Use   Vaping Use: Never used  Substance and Sexual Activity   Alcohol use: No   Drug use: No   Sexual activity: Not Currently  Other Topics Concern   Not on file  Social History Narrative   Regular exercise:  No   Lives alone   Has 2 grown sons, both local.  1 grandson   On disability due to her knee pain   Completed 12th grade   Enjoys television   No pets.    Social Determinants of Health   Financial Resource Strain: Low Risk    Difficulty of Paying Living Expenses: Not hard at all  Food Insecurity: No Food Insecurity   Worried About Charity fundraiser in the Last Year: Never true   [place] in the Last Year: Never true  Transportation Needs: No Transportation Needs   Lack of Transportation (Medical): No   Lack of Transportation (Non-Medical): No  Physical Activity: Insufficiently Active   Days of Exercise per Week: 1 day   Minutes of Exercise per Session: 20 min  Stress: No Stress Concern Present   Feeling of Stress : Not at all  Social Connections: Moderately Isolated   Frequency of Communication with Friends and Family: More than three times a week   Frequency of Social Gatherings with Friends and Family: Once a week   Attends Religious  Services: 1 to 4 times per year   Active Member of [employer] or Organizations: No   Attends Music therapist: Never   Marital Status: Never married  Human resources officer Violence: Not At Risk   Fear of Current or Ex-Partner: No   Emotionally Abused: No   Physically Abused: No   Sexually Abused: No    Outpatient Medications Prior to Visit  Medication Sig Dispense Refill   albuterol (VENTOLIN HFA) 108 (90 Base) MCG/ACT inhaler USE 2 INHALATIONS BY MOUTH  EVERY 6 HOURS AS NEEDED FOR WHEEZING OR SHORTNESS OF  BREATH (Patient taking differently: Inhale 2 puffs into the lungs every  6 (six) hours as needed for wheezing (Cough).) 34 g 3   amLODipine (NORVASC) 5 MG tablet Take 1 tablet (5 mg total) by mouth daily. 90 tablet 0   blood glucose meter kit and supplies KIT Dispense based on patient and insurance preference. Use up to four times daily as directed. (FOR ICD-9 250.00, 250.01). 1 each 0   diclofenac Sodium (VOLTAREN) 1 % GEL Apply 2 g topically 4 (four) times daily. 100 g 1   FLOVENT HFA 110 MCG/ACT inhaler USE 2 INHALATIONS BY MOUTH  TWICE DAILY 36 g 3   losartan (COZAAR) 100 MG tablet Take 1 tablet (100 mg total) by mouth daily. 90 tablet 0   Multiple Vitamin (MULTIVITAMIN WITH MINERALS) TABS tablet Take 1 tablet by mouth daily. Vitafusion     omeprazole (PRILOSEC) 20 MG capsule TAKE 1 CAPSULE BY MOUTH  DAILY 90 capsule 3   ondansetron (ZOFRAN-ODT) 4 MG disintegrating tablet Take 1 tablet (4 mg total) by mouth every 6 (six) hours as needed for nausea or vomiting. 20 tablet 0   polyethylene glycol (MIRALAX / GLYCOLAX) 17 g packet Take 17 g by mouth daily as needed for moderate constipation or severe constipation.     pravastatin (PRAVACHOL) 80 MG tablet TAKE 1 TABLET BY MOUTH  DAILY 90 tablet 1   psyllium (METAMUCIL) 58.6 % powder Take 1 packet by mouth daily as needed (constipation).     triamcinolone cream (KENALOG) 0.1 % Apply 1 application topically 2 (two) times daily. 30 g 0    valACYclovir (VALTREX) 1000 MG tablet TAKE 1 TABLET BY MOUTH  DAILY (Patient taking differently: Take 1,000 mg by mouth daily.) 90 tablet 3   No facility-administered medications prior to visit.    Allergies  Allergen Reactions   Sulfamethoxazole-Trimethoprim [Bactrim] Itching   Codeine Nausea And Vomiting and Anxiety    ROS See HPI    Objective:    Physical Exam Psychiatric:        Behavior: Behavior normal.    LMP 11/10/2011  Wt Readings from Last 3 Encounters:  12/09/20 278 lb 9.6 oz (126.4 kg)  11/28/20 282 lb (127.9 kg)  11/18/20 283 lb (128.4 kg)    Diabetic Foot Exam - Simple   No data filed    Lab Results  Component Value Date   WBC 12.1 (H) 10/15/2020   HGB 11.8 (L) 10/15/2020   HCT 36.4 10/15/2020   PLT 282 10/15/2020   GLUCOSE 101 (H) 12/17/2020   CHOL 149 06/17/2020   TRIG 98.0 06/17/2020   HDL 44.00 06/17/2020   LDLCALC 86 06/17/2020   ALT 38 10/15/2020   AST 37 10/15/2020   NA 142 12/17/2020   K 3.5 12/17/2020   CL 107 12/17/2020   CREATININE 0.74 12/17/2020   BUN 16 12/17/2020   CO2 24 12/17/2020   TSH 4.12 12/28/2018   INR 1.0 10/22/2008   HGBA1C 6.7 (H) 10/07/2020   MICROALBUR 0.97 07/13/2011    Lab Results  Component Value Date   TSH 4.12 12/28/2018   Lab Results  Component Value Date   WBC 12.1 (H) 10/15/2020   HGB 11.8 (L) 10/15/2020   HCT 36.4 10/15/2020   MCV 89.4 10/15/2020   PLT 282 10/15/2020   Lab Results  Component Value Date   NA 142 12/17/2020   K 3.5 12/17/2020   CO2 24 12/17/2020   GLUCOSE 101 (H) 12/17/2020   BUN 16 12/17/2020   CREATININE 0.74 12/17/2020   BILITOT 0.1 (L) 10/15/2020  ALKPHOS 53 10/15/2020   AST 37 10/15/2020   ALT 38 10/15/2020   PROT 7.3 10/15/2020   ALBUMIN 3.5 10/15/2020   CALCIUM 9.7 12/17/2020   ANIONGAP 6 10/15/2020   GFR 86.23 12/17/2020   Lab Results  Component Value Date   CHOL 149 06/17/2020   Lab Results  Component Value Date   HDL 44.00 06/17/2020   Lab Results   Component Value Date   LDLCALC 86 06/17/2020   Lab Results  Component Value Date   TRIG 98.0 06/17/2020   Lab Results  Component Value Date   CHOLHDL 3 06/17/2020   Lab Results  Component Value Date   HGBA1C 6.7 (H) 10/07/2020       Assessment & Plan:   Problem List Items Addressed This Visit       Unprioritized   Essential hypertension    Per report, bp yesterday was stable. I have advised the pt to recheck this afternoon when she gets home and call us with the reading. In the meantime, will continue losartan 187m and amlodipine 510m         No orders of the defined types were placed in this encounter.    I discussed the assessment and treatment plan with the patient. The patient was provided an opportunity to ask questions and all were answered. The patient agreed with the plan and demonstrated an understanding of the instructions.   The patient was advised to call back or seek an in-person evaluation if the symptoms worsen or if the condition fails to improve as anticipated.  I,Shehryar BaMultimedia programmers a scEducation administratoror MeMarsh & McLennanNP.,have documented all relevant documentation on the behalf of MeNance PearNP,as directed by  MeNance PearNP while in the presence of MeNance PearNP.  I provided 11 minutes of non-face-to-face time during this encounter.   MeNance PearNP LeEstée Laudert [phone]) [phone])  [place]

## 2021-01-05 NOTE — Assessment & Plan Note (Signed)
Per report, bp yesterday was stable. I have advised the pt to recheck this afternoon when she gets home and call us with the reading. In the meantime, will continue losartan '100mg'$  and amlodipine '5mg'$ .

## 2021-01-06 ENCOUNTER — Telehealth: Payer: Self-pay | Admitting: Family

## 2021-01-06 NOTE — Telephone Encounter (Signed)
Pt. Called in to report BP readings that she was advise to report via mychart visit on 8/22:  Lastnight: 148/75 This morning: 142/87

## 2021-01-06 NOTE — Telephone Encounter (Signed)
BP Readings from Last 3 Encounters:  12/02/20 (!) 148/99  11/28/20 128/68  11/04/20 (!) 108/57   OK, they look a bit better than the other day. No changes to her medicines.

## 2021-01-07 NOTE — Telephone Encounter (Signed)
Patient advised of results and to continue same management.

## 2021-01-20 ENCOUNTER — Telehealth: Payer: Self-pay | Admitting: Skilled Nursing Facility1

## 2021-01-20 NOTE — Telephone Encounter (Signed)
Pt wanted some ideas for breakfast.   Dietitian offered pt some ideas.

## 2021-02-26 ENCOUNTER — Other Ambulatory Visit: Payer: Self-pay

## 2021-02-26 ENCOUNTER — Encounter: Payer: Medicare Other | Attending: General Surgery | Admitting: Skilled Nursing Facility1

## 2021-02-26 DIAGNOSIS — E669 Obesity, unspecified: Secondary | ICD-10-CM | POA: Diagnosis present

## 2021-02-26 DIAGNOSIS — E119 Type 2 diabetes mellitus without complications: Secondary | ICD-10-CM | POA: Insufficient documentation

## 2021-02-26 NOTE — Progress Notes (Signed)
Bariatric Nutrition Follow-Up Visit Medical Nutrition Therapy   Post-Operative sleeve Surgery Surgery Date: 10/14/2020  NUTRITION ASSESSMENT    Anthropometrics  Start weight at [facility]: 312 lbs (date: 02/20/2020) Today's weight: 263 lbs  Clinical  Medical hx: OSA, DM, HTN Medications:  see list, omeprezole Labs:    Lifestyle & Dietary Hx  Pt states she needs to take omeprazole daily.   Pt states she never did check out BELT. Pt states she tries to go for walks and has a peddler. Pt states she has noticed she can walk faster and better since starting her walks no longer needing to stop in the middle.  Pt state she goes to bed at 12:30-1am and wakes around 10am.   Pt states plain water sometimes bothers her stomach. Pt states she does not know how much fluid she drinks but state she is not getting in as much as she should.   Pt states she is not worried about the upcoming holiday season.   Estimated daily fluid intake: unknown oz Estimated daily protein intake: 60+ g Supplements: multi (approved) and calcium Current average weekly physical activity: some walking some peddling stating she is not consistent but is putting in the effort to make changes    24-Hr Dietary Recall First Meal: skipped or protein shake Snack:  imitatain crab meat Second Meal 12:30: scrambled egg + meat + cheese Snack:  cheese  Third Meal: beans + meat or beans + chili or chicken + fat free cream of chicken soup + green beans Snack: sugar free popcicle or jello Beverages: water, water + flavorings   Post-Op Goals/ Signs/ Symptoms Using straws: no Drinking while eating: no Chewing/swallowing difficulties: no Changes in vision: no Changes to mood/headaches: no Hair loss/changes to skin/nails: worried about some hair loss after she took her sew in out; took her sew in out about 1 month after surgery; states she was having some thinness before that time Difficulty focusing/concentrating: no Sweating:  no Dizziness/lightheadedness: no Palpitations: no  Carbonated/caffeinated beverages: no N/V/D/C/Gas: using metamucil daily, bowel movement daily Abdominal pain: no Dumping syndrome: no    NUTRITION DIAGNOSIS  Overweight/obesity ([place]-3.3) related to past poor dietary habits and physical inactivity as evidenced by completed bariatric surgery and following dietary guidelines for continued weight loss and healthy nutrition status.     NUTRITION INTERVENTION Nutrition counseling (C-1) and education (E-2) to facilitate bariatric surgery goals, including: The importance of consuming adequate calories as well as certain nutrients daily due to the body's need for essential vitamins, minerals, and fats The importance of daily physical activity and to reach a goal of at least 150 minutes of moderate to vigorous physical activity weekly (or as directed by their physician) due to benefits such as increased musculature and improved lab values The importance of intuitive eating specifically learning hunger-satiety cues and understanding the importance of learning a new body: The importance of mindful eating to avoid grazing behaviors  Importance of vegetables To have an overall healthy diet, adult men and women are recommended to consume anywhere from 2-3 cups of vegetables daily. Vegetables provide a wide range of vitamins and minerals such as vitamin A, vitamin C, potassium, and folic acid. According to the [facility], including fruit and vegetables daily may reduce the risk of cardiovascular disease, certain cancers, and other non-communicable diseases. Purpose of hydration: Water makes up over 50% of your total body water, and is part of many organs throughout the body. Water is essential to transport digested nutrients, regulate  body temperature, rid the body of waste products, and protects joints and the spinal cord. When not properly hydrated you will begin to experience headaches, cramps  and dizziness. Further dehydration can result in rapid heart rate, shock, oliguria, and may cause seizures.  https://www.merckmanuals.com/home/hormonal-and-metabolic-disordehttps://www.usgs.gov/special-topic/water-science-school/science/water-you-water-and-human-body?qt-science_center_objects=0#qt-science_center_objectsrs/water-balance/about-body-water HistoricalGrowth.gl https://www.stevens.org/ PimpTShirt.fi https://www.health.InvestmentBrowse.at  Goals: -add in starchy vegetables; 1/4 cup to 1/5 cup is one serving -avoid using a lot of cheese for flavor   Handouts Provided Include  Phase 5 folder  Learning Style & Readiness for Change Teaching method utilized: Visual & Auditory  Demonstrated degree of understanding via: Teach Back  Readiness Level: action Barriers to learning/adherence to lifestyle change: none identified   RD's Notes for Next Visit Assess adherence to pt chosen goals    MONITORING & EVALUATION Dietary intake, weekly physical activity, body weight  Next Steps Patient is to follow-up in 3 months

## 2021-03-03 ENCOUNTER — Telehealth: Payer: Self-pay

## 2021-03-03 ENCOUNTER — Other Ambulatory Visit: Payer: Self-pay

## 2021-03-03 MED ORDER — AMLODIPINE BESYLATE 5 MG PO TABS: 5.0000 mg | ORAL_TABLET | Freq: Every day | ORAL | 0 refills | Status: DC

## 2021-03-03 NOTE — Telephone Encounter (Signed)
Pt called inquiring if Leah Hampton is wanting her to stay on Amlodipine or not.  She stated she is down to her last two pills and noticed she does not have any refills on her script.  If she is to stay on the medication, please send this script to [facility] on [address].  Pt stated she would normally have it sent to [facility], but if [name] wants to keep her on it, she will need it soon and it would need to be sent locally to [facility].

## 2021-03-03 NOTE — Telephone Encounter (Signed)
Refill sent to her local pharmacy, she will be seen at her scheduled appointment in November

## 2021-03-27 IMAGING — MG DIGITAL SCREENING BILATERAL MAMMOGRAM WITH TOMO AND CAD
8 of 17 series · 8 of 40 positions shown · non-contrast
Comparison: Previous exam(s).

CLINICAL DATA: Screening.

EXAM:
DIGITAL SCREENING BILATERAL MAMMOGRAM WITH TOMO AND CAD

[L MLO synth-2D (1 of 2)]
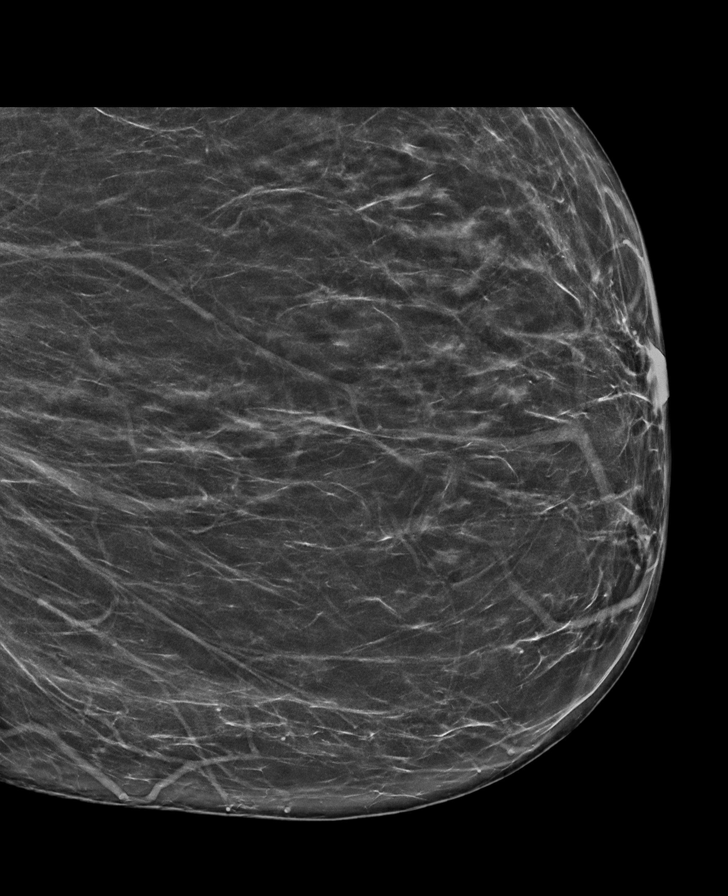

[R CC synth-2D (1 of 2)]
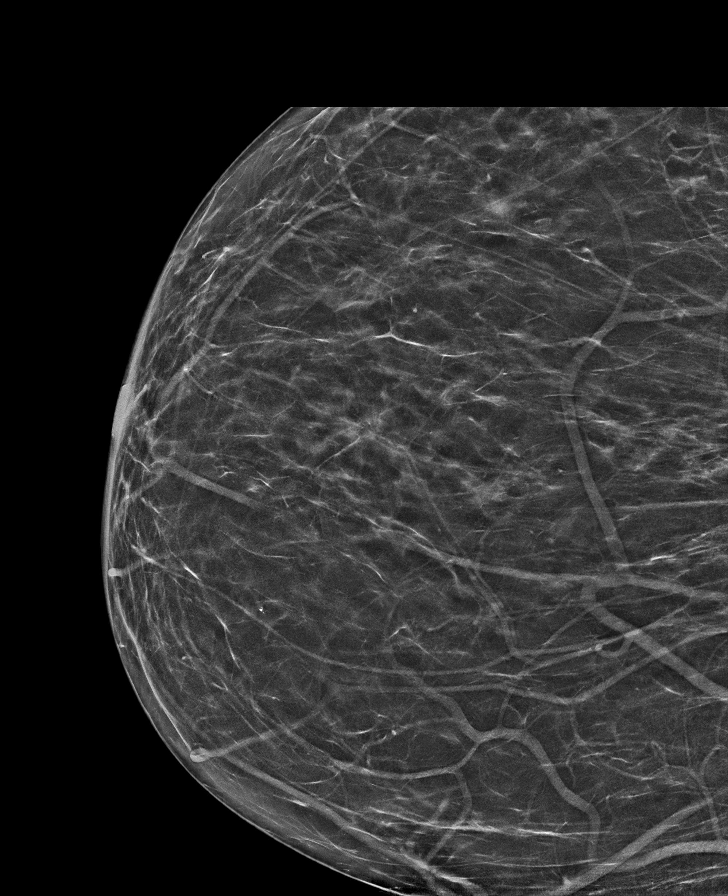

[R MLO synth-2D]
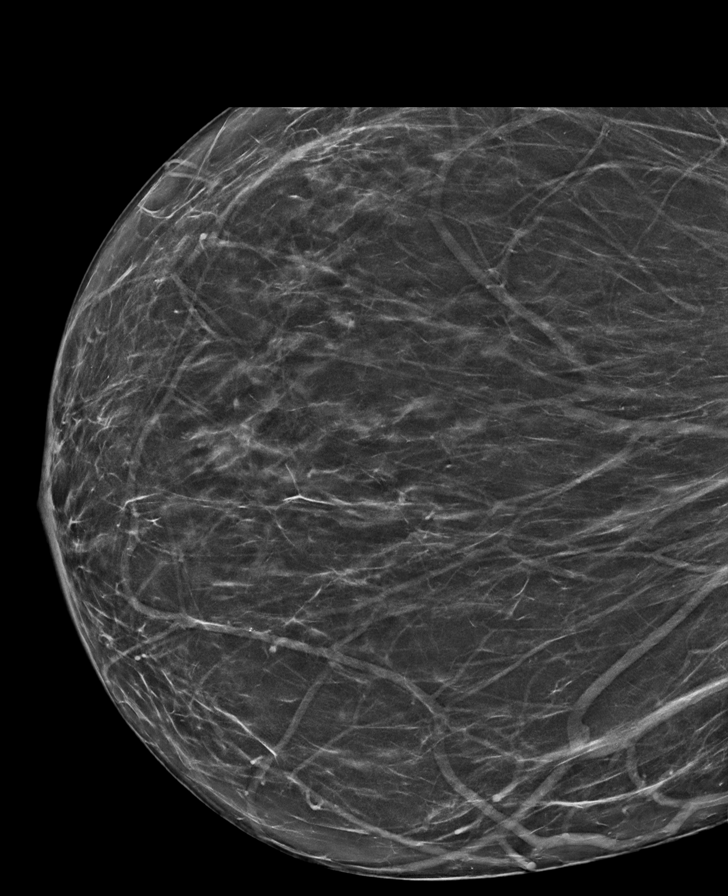

[L CC synth-2D (1 of 2)]
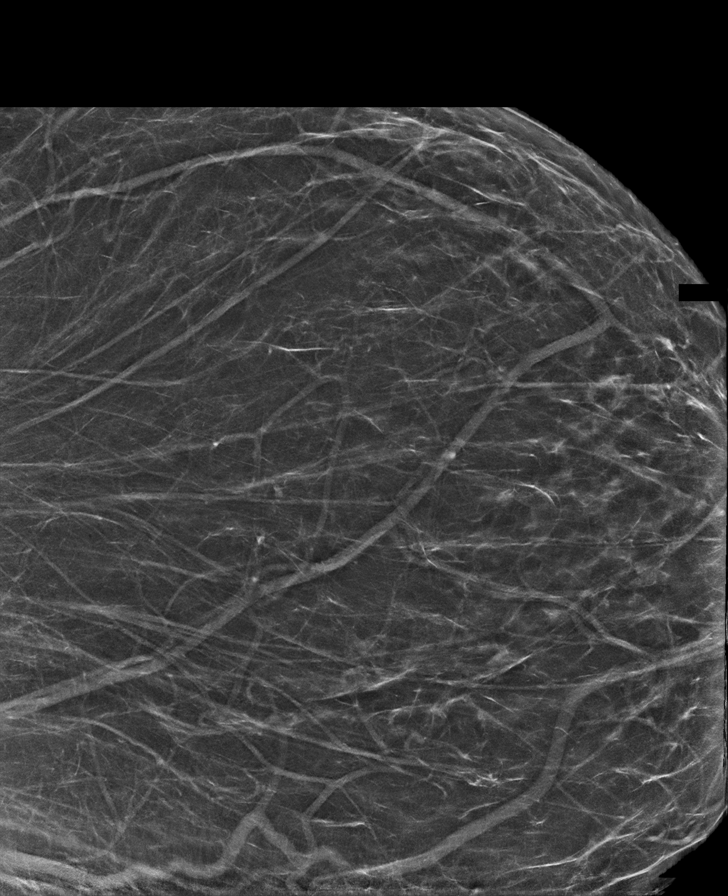

[L MLO synth-2D (2 of 2)]
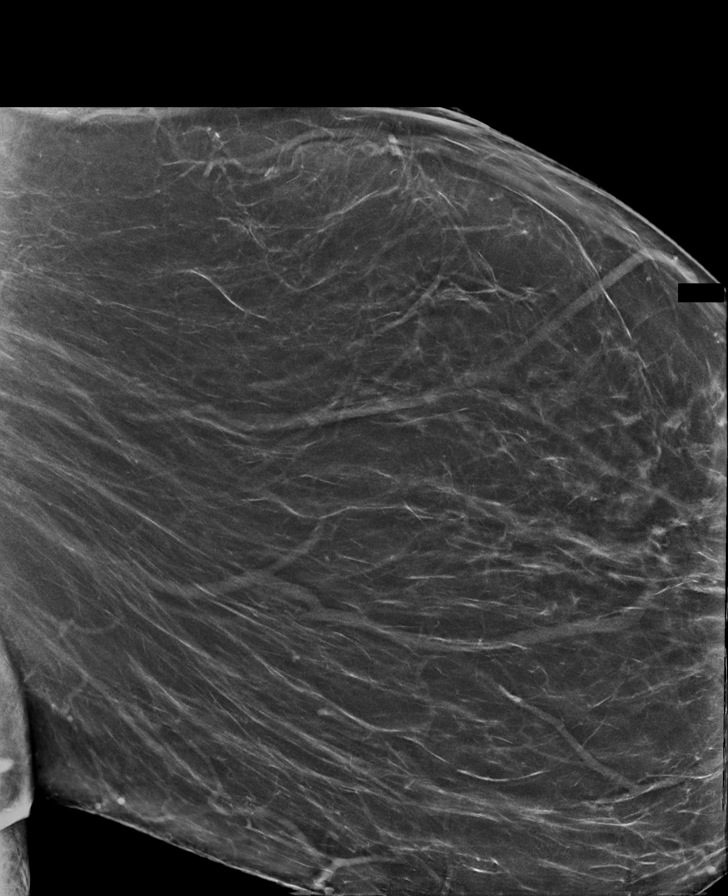

[R CC synth-2D (2 of 2)]
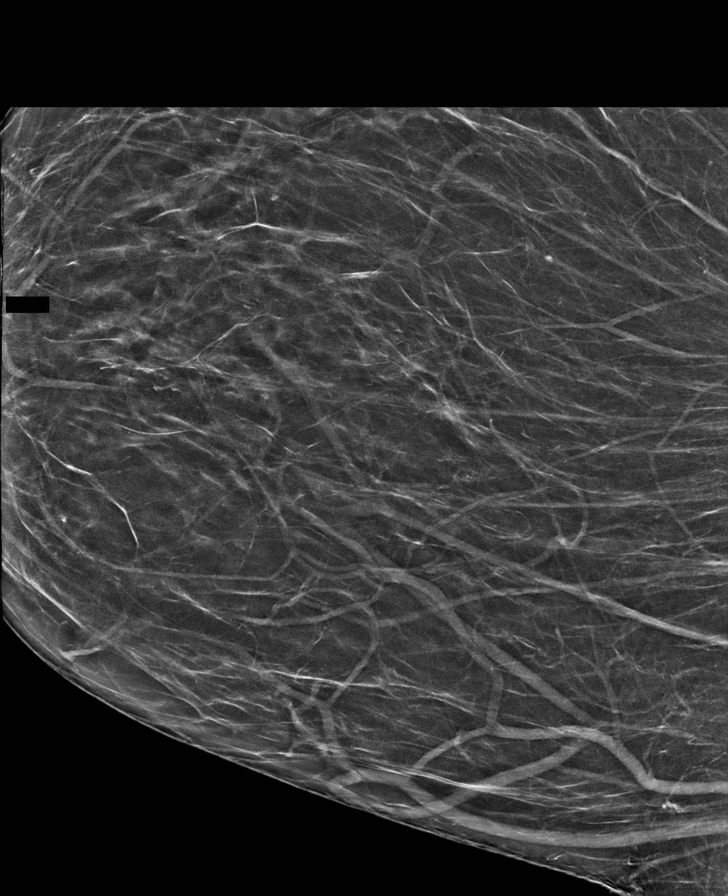

[L CC synth-2D (2 of 2)]
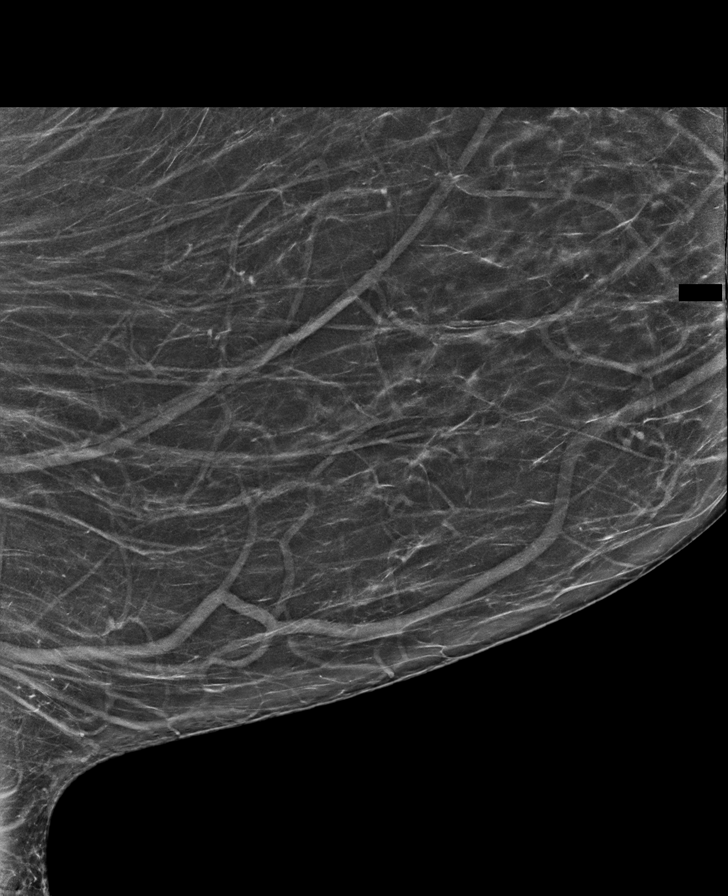

[R CC tomo · tomo slice 35/69.0]
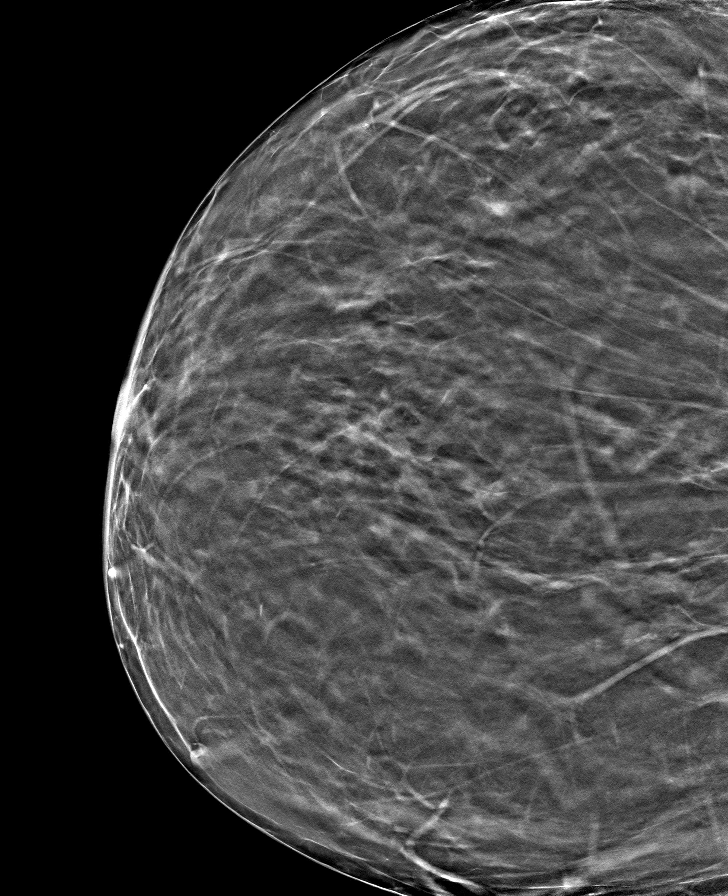

[8 of 40 positions shown; findings below may reference images not displayed]

ACR Breast Density Category b: There are scattered areas of
fibroglandular density.
FINDINGS: There are no findings suspicious for malignancy. Images were
processed with CAD.
IMPRESSION: No mammographic evidence of malignancy. A result letter of this
screening mammogram will be mailed directly to the patient.

RECOMMENDATION:
Screening mammogram in one year. (Code:[id number])

BI-RADS CATEGORY  1: Negative.

## 2021-04-02 DIAGNOSIS — K439 Ventral hernia without obstruction or gangrene: Secondary | ICD-10-CM | POA: Diagnosis not present

## 2021-04-02 DIAGNOSIS — I10 Essential (primary) hypertension: Secondary | ICD-10-CM | POA: Diagnosis not present

## 2021-04-02 DIAGNOSIS — Z9884 Bariatric surgery status: Secondary | ICD-10-CM | POA: Diagnosis not present

## 2021-04-03 ENCOUNTER — Other Ambulatory Visit: Payer: Self-pay | Admitting: Family

## 2021-04-06 ENCOUNTER — Ambulatory Visit (INDEPENDENT_AMBULATORY_CARE_PROVIDER_SITE_OTHER): Payer: Medicare Other | Admitting: Family

## 2021-04-06 ENCOUNTER — Other Ambulatory Visit: Payer: Self-pay

## 2021-04-06 ENCOUNTER — Ambulatory Visit: Payer: Medicare Other | Attending: Internal Medicine

## 2021-04-06 VITALS — BP 124/56 | HR 76 | Temp 97.7°F | Resp 16 | Ht 62.0 in | Wt 255.0 lb

## 2021-04-06 DIAGNOSIS — E782 Mixed hyperlipidemia: Secondary | ICD-10-CM

## 2021-04-06 DIAGNOSIS — A6000 Herpesviral infection of urogenital system, unspecified: Secondary | ICD-10-CM | POA: Diagnosis not present

## 2021-04-06 DIAGNOSIS — I10 Essential (primary) hypertension: Secondary | ICD-10-CM | POA: Diagnosis not present

## 2021-04-06 DIAGNOSIS — K219 Gastro-esophageal reflux disease without esophagitis: Secondary | ICD-10-CM

## 2021-04-06 DIAGNOSIS — E119 Type 2 diabetes mellitus without complications: Secondary | ICD-10-CM | POA: Diagnosis not present

## 2021-04-06 DIAGNOSIS — G4733 Obstructive sleep apnea (adult) (pediatric): Secondary | ICD-10-CM

## 2021-04-06 DIAGNOSIS — J45909 Unspecified asthma, uncomplicated: Secondary | ICD-10-CM | POA: Diagnosis not present

## 2021-04-06 DIAGNOSIS — E559 Vitamin D deficiency, unspecified: Secondary | ICD-10-CM

## 2021-04-06 DIAGNOSIS — Z23 Encounter for immunization: Secondary | ICD-10-CM

## 2021-04-06 DIAGNOSIS — D649 Anemia, unspecified: Secondary | ICD-10-CM | POA: Diagnosis not present

## 2021-04-06 DIAGNOSIS — Z903 Acquired absence of stomach [part of]: Secondary | ICD-10-CM | POA: Diagnosis not present

## 2021-04-06 LAB — HEMOGLOBIN A1C: Hgb A1c MFr Bld: 6.2 % (ref 4.6–6.5)

## 2021-04-06 LAB — COMPREHENSIVE METABOLIC PANEL
ALT: 17 U/L (ref 0–35)
AST: 18 U/L (ref 0–37)
Albumin: 3.9 g/dL (ref 3.5–5.2)
Alkaline Phosphatase: 69 U/L (ref 39–117)
BUN: 17 mg/dL (ref 6–23)
CO2: 30 mEq/L (ref 19–32)
Calcium: 9.4 mg/dL (ref 8.4–10.5)
Chloride: 106 mEq/L (ref 96–112)
Creatinine, Ser: 0.84 mg/dL (ref 0.40–1.20)
GFR: 73.91 mL/min (ref >60.00)
Glucose, Bld: 92 mg/dL (ref 70–99)
Potassium: 3.7 mEq/L (ref 3.5–5.1)
Sodium: 143 mEq/L (ref 135–145)
Total Bilirubin: 0.4 mg/dL (ref 0.2–1.2)
Total Protein: 6.8 g/dL (ref 6.0–8.3)

## 2021-04-06 LAB — CBC WITH DIFFERENTIAL/PLATELET
Basophils Absolute: 0 10*3/uL (ref 0.0–0.1)
Basophils Relative: 0.3 % (ref 0.0–3.0)
Eosinophils Absolute: 0.1 10*3/uL (ref 0.0–0.7)
Eosinophils Relative: 1.3 % (ref 0.0–5.0)
HCT: 38.4 % (ref 36.0–46.0)
Hemoglobin: 12.3 g/dL (ref 12.0–15.0)
Lymphocytes Relative: 38.3 % (ref 12.0–46.0)
Lymphs Abs: 3.2 10*3/uL (ref 0.7–4.0)
MCHC: 32 g/dL (ref 30.0–36.0)
MCV: 89 fl (ref 78.0–100.0)
Monocytes Absolute: 0.5 10*3/uL (ref 0.1–1.0)
Monocytes Relative: 5.6 % (ref 3.0–12.0)
Neutro Abs: 4.6 10*3/uL (ref 1.4–7.7)
Neutrophils Relative %: 54.5 % (ref 43.0–77.0)
Platelets: 258 10*3/uL (ref 150.0–400.0)
RBC: 4.31 Mil/uL (ref 3.87–5.11)
RDW: 16.4 % — ABNORMAL HIGH (ref 11.5–15.5)
WBC: 8.4 10*3/uL (ref 4.0–10.5)

## 2021-04-06 LAB — FERRITIN: Ferritin: 88.2 ng/mL (ref 10.0–291.0)

## 2021-04-06 LAB — VITAMIN D 25 HYDROXY (VIT D DEFICIENCY, FRACTURES): VITD: 55.03 ng/mL (ref 30.00–100.00)

## 2021-04-06 LAB — IRON: Iron: 61 ug/dL (ref 42–145)

## 2021-04-06 MED ORDER — ALBUTEROL SULFATE HFA 108 (90 BASE) MCG/ACT IN AERS: INHALATION_SPRAY | RESPIRATORY_TRACT | 3 refills | Status: DC

## 2021-04-06 MED ORDER — PRAVASTATIN SODIUM 80 MG PO TABS: 80.0000 mg | ORAL_TABLET | Freq: Every day | ORAL | 1 refills | Status: DC

## 2021-04-06 MED ORDER — AMLODIPINE BESYLATE 5 MG PO TABS: 5.0000 mg | ORAL_TABLET | Freq: Every day | ORAL | 1 refills | Status: DC

## 2021-04-06 MED ORDER — VALACYCLOVIR HCL 1 G PO TABS: 1000.0000 mg | ORAL_TABLET | Freq: Every day | ORAL | 1 refills | Status: DC

## 2021-04-06 NOTE — Assessment & Plan Note (Signed)
Stable on daily vatrex suppressive therapy. Continue same.

## 2021-04-06 NOTE — Progress Notes (Addendum)
Subjective:   By signing my name below, I, Shehryar Baig, attest that this documentation has been prepared under the direction and in the presence of Debbrah Alar NP. 04/06/2021    Patient ID: Leah Hampton, female    DOB: 05-Dec-1955, 65 y.o.   MRN: [id number]  Chief Complaint  Patient presents with   Hypertension    Here for 3 months follow up    Hypertension  Patient is in today for   Blood pressure- She is recording her blood pressure this past week and reports her SBP is running 131-156 and her DBP is 79-97. She continues taking 100 mg losartan daily PO, 5 mg amlodipine daily PO and reports no new issues while taking them.  BP Readings from Last 3 Encounters:  04/06/21 (!) 124/56  12/02/20 (!) 148/99  11/28/20 128/68   Pulse Readings from Last 3 Encounters:  04/06/21 76  11/28/20 87  11/04/20 90   Weight- She continues regularly losing weight at this time following her lap sleeve gastrectomy.   Wt Readings from Last 3 Encounters:  04/06/21 255 lb (115.7 kg)  02/26/21 263 lb (119.3 kg)  12/09/20 278 lb 9.6 oz (126.4 kg)   Cholesterol- Her last lipid panel looked good. She continues taking pravastatin and reports no new issues while taking it.  Lab Results  Component Value Date   CHOL 149 06/17/2020   HDL 44.00 06/17/2020   LDLCALC 86 06/17/2020   TRIG 98.0 06/17/2020   CHOLHDL 3 06/17/2020   Valtrex- She continues taking 1000 mg valtrex daily PO and reports no new breakouts.  OSA She has never worn a CPAP machine as her sleep apnea was found to be mild. She wakes up rested unless she goes to bed too late.   Asthma- She is managing her asthma well at this time. She continues taking Flovent inhaler 2x daily. She has albuterol on her if her symptoms worsen but has not used it recently.  Reflux- She continues taking 20 mg omeprazole daily PO and reports no new issues while taking it.  Immunizations- She received a flu vaccines during this visit. She also  received the bivalent Covid-19 vaccines before this visit.  Vision- She is due for vision care and is planning on scheduling an appointment.   Health Maintenance Due  Topic Date Due   Zoster Vaccines- Shingrix (1 of 2) Never done   TETANUS/TDAP  07/03/2018   OPHTHALMOLOGY EXAM  06/21/2019   INFLUENZA VACCINE  12/15/2020    Past Medical History:  Diagnosis Date   Bronchitis    Chronic bronchitis (HCC)    Cystitis    Diabetes type 2, controlled ([place])    Hematuria    urologice eval, Dr Estill Dooms   History of chest pain    Hyperbilirubinemia    Hyperlipemia    Hypertelorism    Hypertension    Leukocytosis    Obesity    Osteoarthritis    right knee   Sleep apnea 2012   slight    Vaginal Pap smear, abnormal    Vitamin D deficiency 02/09/2018    Past Surgical History:  Procedure Laterality Date   CERVICAL POLYPECTOMY  07/15/2020   Procedure: POLYPECTOMY with Myosure;  Surgeon: Lavonia Drafts, MD;  Location: [place];  Service: Gynecology;;   COLONOSCOPY WITH PROPOFOL N/A 03/27/2015   Procedure: COLONOSCOPY WITH PROPOFOL;  Surgeon: Milus Banister, MD;  [facility];  Service: Endoscopy;  Laterality: N/A;   COLONOSCOPY WITH PROPOFOL N/A 10/02/2020  Procedure: COLONOSCOPY WITH PROPOFOL;  Surgeon: Milus Banister, MD;  Location[facility];  Service: Endoscopy;  Laterality: N/A;   COLPOSCOPY N/A 07/15/2020   Procedure: COLPOSCOPY;  Surgeon: Lavonia Drafts, MD;  Location: [place];  Service: Gynecology;  Laterality: N/A;   DILATATION & CURETTAGE/HYSTEROSCOPY WITH MYOSURE N/A 01/05/2017   Procedure: DILATATION & CURETTAGE/HYSTEROSCOPY WITH MYOSURE;  Surgeon: Thurnell Lose, MD;  Location: [facility];  Service: Gynecology;  Laterality: N/A;  PostMenopausal Bleeding   HYSTEROSCOPY WITH D & C N/A 05/22/2019   Procedure: DILATATION AND CURETTAGE /HYSTEROSCOPY, POLYPECTOMY WITH MYOSURE;  Surgeon: Lavonia Drafts, MD;  Location: [facility];  Service: Gynecology;   Laterality: N/A;   HYSTEROSCOPY WITH D & C N/A 07/15/2020   Procedure: DILATATION AND CURETTAGE /HYSTEROSCOPY;  Surgeon: Lavonia Drafts, MD;  Location: [place];  Service: Gynecology;  Laterality: N/A;   KNEE ARTHROSCOPY  04/2007   left knee   TOTAL KNEE ARTHROPLASTY Left 10/2008   Dr Roxine Caddy   UPPER GI ENDOSCOPY N/A 10/14/2020   Procedure: UPPER GI ENDOSCOPY;  Surgeon: [name], MD;  Location: [facility];  Service: General;  Laterality: N/A;    Family History  Problem Relation Age of Onset   Heart failure Mother    Arthritis Mother    Hypertension Mother    Stroke Mother    Diabetes Mellitus II Mother    Heart attack Father 4   Diabetes Other    Arthritis Other    Stroke Other    Coronary artery disease Other    Hypertension Sister        x 2   CVA Sister    CAD Sister 97   Diabetes Mellitus II Sister    Cirrhosis Brother    Alcohol abuse Brother    Colon cancer Neg Hx     Social History   Socioeconomic History   Marital status: Single    Spouse name: Not on file   Number of children: 2   Years of education: Not on file   Highest education level: Not on file  Occupational History   Occupation: disabled  Tobacco Use   Smoking status: Never   Smokeless tobacco: Never  Vaping Use   Vaping Use: Never used  Substance and Sexual Activity   Alcohol use: No   Drug use: No   Sexual activity: Not Currently  Other Topics Concern   Not on file  Social History Narrative   Regular exercise:  No   Lives alone   Has 2 grown sons, both local.  1 grandson   On disability due to her knee pain   Completed 12th grade   Enjoys television   No pets.    Social Determinants of Health   Financial Resource Strain: Low Risk    Difficulty of Paying Living Expenses: Not hard at all  Food Insecurity: Not on file  Transportation Needs: No Transportation Needs   Lack of Transportation (Medical): No   Lack of Transportation (Non-Medical): No  Physical Activity: Insufficiently  Active   Days of Exercise per Week: 1 day   Minutes of Exercise per Session: 20 min  Stress: No Stress Concern Present   Feeling of Stress : Not at all  Social Connections: Moderately Isolated   Frequency of Communication with Friends and Family: More than three times a week   Frequency of Social Gatherings with Friends and Family: Once a week   Attends Religious Services: 1 to 4 times per year   Active Member of [employer]  or Organizations: No   Attends Archivist Meetings: Never   Marital Status: Never married  Human resources officer Violence: Not At Risk   Fear of Current or Ex-Partner: No   Emotionally Abused: No   Physically Abused: No   Sexually Abused: No    Outpatient Medications Prior to Visit  Medication Sig Dispense Refill   blood glucose meter kit and supplies KIT Dispense based on patient and insurance preference. Use up to four times daily as directed. (FOR ICD-9 250.00, 250.01). 1 each 0   diclofenac Sodium (VOLTAREN) 1 % GEL Apply 2 g topically 4 (four) times daily. 100 g 1   FLOVENT HFA 110 MCG/ACT inhaler USE 2 INHALATIONS BY MOUTH  TWICE DAILY 36 g 3   losartan (COZAAR) 100 MG tablet TAKE 1 TABLET BY MOUTH  DAILY 90 tablet 3   Multiple Vitamin (MULTIVITAMIN WITH MINERALS) TABS tablet Take 1 tablet by mouth daily. Vitafusion     omeprazole (PRILOSEC) 20 MG capsule TAKE 1 CAPSULE BY MOUTH  DAILY 90 capsule 3   polyethylene glycol (MIRALAX / GLYCOLAX) 17 g packet Take 17 g by mouth daily as needed for moderate constipation or severe constipation.     psyllium (METAMUCIL) 58.6 % powder Take 1 packet by mouth daily as needed (constipation).     triamcinolone cream (KENALOG) 0.1 % Apply 1 application topically 2 (two) times daily. 30 g 0   albuterol (VENTOLIN HFA) 108 (90 Base) MCG/ACT inhaler USE 2 INHALATIONS BY MOUTH  EVERY 6 HOURS AS NEEDED FOR WHEEZING OR SHORTNESS OF  BREATH (Patient taking differently: Inhale 2 puffs into the lungs every 6 (six) hours as needed for  wheezing (Cough).) 34 g 3   amLODipine (NORVASC) 5 MG tablet Take 1 tablet (5 mg total) by mouth daily. 90 tablet 0   ondansetron (ZOFRAN-ODT) 4 MG disintegrating tablet Take 1 tablet (4 mg total) by mouth every 6 (six) hours as needed for nausea or vomiting. 20 tablet 0   pravastatin (PRAVACHOL) 80 MG tablet TAKE 1 TABLET BY MOUTH  DAILY 90 tablet 1   valACYclovir (VALTREX) 1000 MG tablet TAKE 1 TABLET BY MOUTH  DAILY (Patient taking differently: Take 1,000 mg by mouth daily.) 90 tablet 3   No facility-administered medications prior to visit.    Allergies  Allergen Reactions   Sulfamethoxazole-Trimethoprim [Bactrim] Itching   Codeine Nausea And Vomiting and Anxiety    ROS See HPI    Objective:    Physical Exam Constitutional:      General: She is not in acute distress.    Appearance: Normal appearance. She is not ill-appearing.  HENT:     Head: Normocephalic and atraumatic.     Right Ear: External ear normal.     Left Ear: External ear normal.  Eyes:     Extraocular Movements: Extraocular movements intact.     Pupils: Pupils are equal, round, and reactive to light.  Cardiovascular:     Rate and Rhythm: Normal rate and regular rhythm.     Heart sounds: Normal heart sounds. No murmur heard.   No gallop.  Pulmonary:     Effort: Pulmonary effort is normal. No respiratory distress.     Breath sounds: Normal breath sounds. No wheezing or rales.  Skin:    General: Skin is warm and dry.  Neurological:     Mental Status: She is alert and oriented to person, place, and time.  Psychiatric:        Behavior: Behavior normal.  BP (!) 124/56 (BP Location: Right Arm, Patient Position: Sitting, Cuff Size: Large)   Pulse 76   Temp 97.7 F (36.5 C) (Oral)   Resp 16   Ht _0  (1.575 m)   Wt 255 lb (115.7 kg)   LMP 11/10/2011   SpO2 97%   BMI 46.64 kg/m  Wt Readings from Last 3 Encounters:  04/06/21 255 lb (115.7 kg)  02/26/21 263 lb (119.3 kg)  12/09/20 278 lb 9.6 oz  (126.4 kg)       Assessment & Plan:   Problem List Items Addressed This Visit       Unprioritized   Hyperlipidemia (Chronic)    Lab Results  Component Value Date   CHOL 149 06/17/2020   HDL 44.00 06/17/2020   LDLCALC 86 06/17/2020   TRIG 98.0 06/17/2020   CHOLHDL 3 06/17/2020  At goal, continue pravastatin 98m.        Relevant Medications   amLODipine (NORVASC) 5 MG tablet   pravastatin (PRAVACHOL) 80 MG tablet   Vitamin D deficiency   Relevant Orders   Vitamin D (25 hydroxy)   Uncomplicated asthma    Stable on flovent bid. Continue same. Has albuterol on hand for prn use.       Relevant Medications   albuterol (VENTOLIN HFA) 108 (90 Base) MCG/ACT inhaler   S/P gastric sleeve procedure    Wt Readings from Last 3 Encounters:  04/06/21 255 lb (115.7 kg)  02/26/21 263 lb (119.3 kg)  12/09/20 278 lb 9.6 oz (126.4 kg)        OSA (obstructive sleep apnea)    Mild, suspect even better now following weight loss. Monitor.       Genital herpes    Stable on daily vatrex suppressive therapy. Continue same.       Relevant Medications   valACYclovir (VALTREX) 1000 MG tablet   Gastroesophageal reflux disease    Stable. Continue omeprazole 2759monce daily.       Essential hypertension    BP Readings from Last 3 Encounters:  04/06/21 (!) 124/56  12/02/20 (!) 148/99  11/28/20 128/68  Stable, continue amlodipine 59m759mnd losartan 100m30m     Relevant Medications   amLODipine (NORVASC) 5 MG tablet   pravastatin (PRAVACHOL) 80 MG tablet   Other Relevant Orders   Hemoglobin A1c   Diabetes type 2, controlled (HCC)    Lab Results  Component Value Date   HGBA1C 6.7 (H) 10/07/2020   HGBA1C 6.7 (H) 06/17/2020   HGBA1C 6.6 (H) 03/14/2020   Lab Results  Component Value Date   MICROALBUR 0.97 07/13/2011   LDLCALC 86 06/17/2020   CREATININE 0.74 12/17/2020  Clinically stable with diabetic diet. Obtain follow up A1c.        Relevant Medications   pravastatin  (PRAVACHOL) 80 MG tablet   Other Relevant Orders   Hemoglobin A1c   Other Visit Diagnoses     Needs flu shot    -  Primary   Relevant Orders   Flu Vaccine QUAD 6+ mos PF IM (Fluarix Quad PF)   Anemia, unspecified type       Relevant Orders   CBC with Differential/Platelet   Comp Met (CMET)   Iron   Ferritin        Meds ordered this encounter  Medications   albuterol (VENTOLIN HFA) 108 (90 Base) MCG/ACT inhaler    Sig: USE 2 INHALATIONS BY MOUTH  EVERY 6 HOURS AS NEEDED FOR WHEEZING OR SHORTNESS OF  BREATH    Dispense:  8 g    Refill:  3    Requesting 1 year supply    Order Specific Question:   Supervising Provider    Answer:   Penni Homans A [[id number]]   amLODipine (NORVASC) 5 MG tablet    Sig: Take 1 tablet (5 mg total) by mouth daily.    Dispense:  90 tablet    Refill:  1    Order Specific Question:   Supervising Provider    Answer:   Penni Homans A [[id number]]   pravastatin (PRAVACHOL) 80 MG tablet    Sig: Take 1 tablet (80 mg total) by mouth daily.    Dispense:  90 tablet    Refill:  1    Requesting 1 year supply    Order Specific Question:   Supervising Provider    Answer:   Penni Homans A [[id number]]   valACYclovir (VALTREX) 1000 MG tablet    Sig: Take 1 tablet (1,000 mg total) by mouth daily.    Dispense:  90 tablet    Refill:  1    Requesting 1 year supply    Order Specific Question:   Supervising Provider    Answer:   Penni Homans A [[id number]]    I, Debbrah Alar NP, personally preformed the services described in this documentation.  All medical record entries made by the scribe were at my direction and in my presence.  I have reviewed the chart and discharge instructions (if applicable) and agree that the record reflects my personal performance and is accurate and complete. 04/06/2021   I,Shehryar Baig,acting as a Education administrator for [name], NP.,have documented all relevant documentation on the behalf of [name], NP,as directed by  [name], NP while in the presence of [name], NP.   [name], NP

## 2021-04-06 NOTE — Assessment & Plan Note (Signed)
Stable on flovent bid. Continue same. Has albuterol on hand for prn use.

## 2021-04-06 NOTE — Assessment & Plan Note (Signed)
Lab Results  Component Value Date   CHOL 149 06/17/2020   HDL 44.00 06/17/2020   LDLCALC 86 06/17/2020   TRIG 98.0 06/17/2020   CHOLHDL 3 06/17/2020   At goal, continue pravastatin 80mg .

## 2021-04-06 NOTE — Assessment & Plan Note (Signed)
Stable. Continue omeprazole 20mg  once daily.

## 2021-04-06 NOTE — Progress Notes (Signed)
   Covid-19 Vaccination Clinic  Name:  ELIZIBETH BREAU    MRN: [id number] DOB: Apr 18, 1956  04/06/2021  Ms. Sellin was observed post Covid-19 immunization for 15 minutes without incident. She was provided with Vaccine Information Sheet and instruction to access the V-Safe system.   Ms. Arcilla was instructed to call 911 with any severe reactions post vaccine: Difficulty breathing  Swelling of face and throat  A fast heartbeat  A bad rash all over body  Dizziness and weakness   Immunizations Administered     Name Date Dose VIS Date Route   Pfizer Covid-19 Vaccine Bivalent Booster 04/06/2021  9:46 AM 0.3 mL 01/14/2021 Intramuscular   Manufacturer: [place]   Lot: [id number]   [place]: [phone]

## 2021-04-06 NOTE — Assessment & Plan Note (Signed)
BP Readings from Last 3 Encounters:  04/06/21 (!) 124/56  12/02/20 (!) 148/99  11/28/20 128/68   Stable, continue amlodipine 5mg  and losartan 100mg .

## 2021-04-06 NOTE — Patient Instructions (Signed)
Please complete lab work prior to leaving.   

## 2021-04-06 NOTE — Assessment & Plan Note (Addendum)
Lab Results  Component Value Date   HGBA1C 6.7 (H) 10/07/2020   HGBA1C 6.7 (H) 06/17/2020   HGBA1C 6.6 (H) 03/14/2020   Lab Results  Component Value Date   MICROALBUR 0.97 07/13/2011   LDLCALC 86 06/17/2020   CREATININE 0.74 12/17/2020   Clinically stable with diabetic diet. Obtain follow up A1c.

## 2021-04-06 NOTE — Assessment & Plan Note (Signed)
Wt Readings from Last 3 Encounters:  04/06/21 255 lb (115.7 kg)  02/26/21 263 lb (119.3 kg)  12/09/20 278 lb 9.6 oz (126.4 kg)

## 2021-04-06 NOTE — Assessment & Plan Note (Signed)
Mild, suspect even better now following weight loss. Monitor.

## 2021-04-18 ENCOUNTER — Other Ambulatory Visit (HOSPITAL_BASED_OUTPATIENT_CLINIC_OR_DEPARTMENT_OTHER): Payer: Self-pay

## 2021-04-18 MED ORDER — PFIZER COVID-19 VAC BIVALENT 30 MCG/0.3ML IM SUSP
INTRAMUSCULAR | 0 refills | Status: DC
  Filled 2021-04-18: qty 0.3, 1d supply, fill #0

## 2021-04-20 ENCOUNTER — Other Ambulatory Visit (HOSPITAL_BASED_OUTPATIENT_CLINIC_OR_DEPARTMENT_OTHER): Payer: Self-pay

## 2021-04-29 ENCOUNTER — Telehealth: Payer: Self-pay | Admitting: Family

## 2021-04-29 MED ORDER — ALBUTEROL SULFATE HFA 108 (90 BASE) MCG/ACT IN AERS: 2.0000 | INHALATION_SPRAY | Freq: Four times a day (QID) | RESPIRATORY_TRACT | 1 refills | Status: DC | PRN

## 2021-04-29 NOTE — Telephone Encounter (Signed)
Per insurance. Albuterol not covered. Proair is preferred. Rx sent.

## 2021-05-19 ENCOUNTER — Ambulatory Visit: Payer: Commercial Managed Care - HMO | Admitting: Family Medicine

## 2021-05-19 DIAGNOSIS — R69 Illness, unspecified: Secondary | ICD-10-CM | POA: Diagnosis not present

## 2021-05-25 ENCOUNTER — Ambulatory Visit (INDEPENDENT_AMBULATORY_CARE_PROVIDER_SITE_OTHER): Payer: Commercial Managed Care - HMO | Admitting: Family Medicine

## 2021-05-25 ENCOUNTER — Ambulatory Visit: Payer: Self-pay

## 2021-05-25 ENCOUNTER — Encounter: Payer: Self-pay | Admitting: Family Medicine

## 2021-05-25 VITALS — BP 124/80 | Ht 62.0 in | Wt 240.0 lb

## 2021-05-25 DIAGNOSIS — R69 Illness, unspecified: Secondary | ICD-10-CM | POA: Diagnosis not present

## 2021-05-25 DIAGNOSIS — M1711 Unilateral primary osteoarthritis, right knee: Secondary | ICD-10-CM

## 2021-05-25 MED ORDER — KETOROLAC TROMETHAMINE 30 MG/ML IJ SOLN
30.0000 mg | Freq: Once | INTRAMUSCULAR | Status: AC
  Administered 2021-05-25: 30 mg via INTRA_ARTICULAR

## 2021-05-25 NOTE — Assessment & Plan Note (Signed)
Acute on chronic in nature.  Continues to lose weight.  Her BMI is close to considering a total knee arthroplasty. -Counseled on home exercise therapy and supportive care. -Toradol injection. -Pursue Zilretta injection.

## 2021-05-25 NOTE — Progress Notes (Signed)
Leah Hampton - 66 y.o. female MRN [id number]  Date of birth: 05/05/1956  SUBJECTIVE:  Including CC & ROS.  No chief complaint on file.   Leah Hampton is a 66 y.o. female that is presenting with acute worsening of her right knee pain.  She has had a successful gastric bypass and has lost 60 pounds.  She is continuing to lose weight.  Independent review of the right knee x-ray from 08/18/2020 shows severe degenerative changes   Review of Systems See HPI   HISTORY: Past Medical, Surgical, Social, and Family History Reviewed & Updated per EMR.   Pertinent Historical Findings include:  Past Medical History:  Diagnosis Date   Bronchitis    Chronic bronchitis ([place])    Cystitis    Diabetes type 2, controlled ([place])    Hematuria    urologice eval, Dr Estill Dooms   History of chest pain    Hyperbilirubinemia    Hyperlipemia    Hypertelorism    Hypertension    Leukocytosis    Obesity    Osteoarthritis    right knee   Sleep apnea 2012   slight    Vaginal Pap smear, abnormal    Vitamin D deficiency 02/09/2018    Past Surgical History:  Procedure Laterality Date   CERVICAL POLYPECTOMY  07/15/2020   Procedure: POLYPECTOMY with Myosure;  Surgeon: Lavonia Drafts, MD;  Location: [place];  Service: Gynecology;;   COLONOSCOPY WITH PROPOFOL N/A 03/27/2015   Procedure: COLONOSCOPY WITH PROPOFOL;  Surgeon: Milus Banister, MD;  [facility];  Service: Endoscopy;  Laterality: N/A;   COLONOSCOPY WITH PROPOFOL N/A 10/02/2020   Procedure: COLONOSCOPY WITH PROPOFOL;  Surgeon: Milus Banister, MD;  [facility];  Service: Endoscopy;  Laterality: N/A;   COLPOSCOPY N/A 07/15/2020   Procedure: COLPOSCOPY;  Surgeon: Lavonia Drafts, MD;  Location: [place];  Service: Gynecology;  Laterality: N/A;   DILATATION & CURETTAGE/HYSTEROSCOPY WITH MYOSURE N/A 01/05/2017   Procedure: DILATATION & CURETTAGE/HYSTEROSCOPY WITH MYOSURE;  Surgeon: Thurnell Lose, MD;  Location: [facility];   Service: Gynecology;  Laterality: N/A;  PostMenopausal Bleeding   HYSTEROSCOPY WITH D & C N/A 05/22/2019   Procedure: DILATATION AND CURETTAGE /HYSTEROSCOPY, POLYPECTOMY WITH MYOSURE;  Surgeon: Lavonia Drafts, MD;  [facility];  Service: Gynecology;  Laterality: N/A;   HYSTEROSCOPY WITH D & C N/A 07/15/2020   Procedure: DILATATION AND CURETTAGE /HYSTEROSCOPY;  Surgeon: Lavonia Drafts, MD;  Location: [place];  Service: Gynecology;  Laterality: N/A;   KNEE ARTHROSCOPY  04/2007   left knee   TOTAL KNEE ARTHROPLASTY Left 10/2008   Dr Roxine Caddy   UPPER GI ENDOSCOPY N/A 10/14/2020   Procedure: UPPER GI ENDOSCOPY;  Surgeon: [name], MD;  Location[facility];  Service: General;  Laterality: N/A;     PHYSICAL EXAM:  VS: BP 124/80 (BP Location: Left Arm, Patient Position: Sitting)    Ht 5\' 2"  (1.575 m)    Wt 240 lb (108.9 kg)    LMP 11/10/2011    BMI 43.90 kg/m  Physical Exam Gen: NAD, alert, cooperative with exam, well-appearing MSK:  Neurovascularly intact     Aspiration/Injection Procedure Note Leah Hampton September 04, 1955  Procedure: Injection Indications: Right knee pain  Procedure Details Consent: Risks of procedure as well as the alternatives and risks of each were explained to the (patient/caregiver).  Consent for procedure obtained. Time Out: Verified patient identification, verified procedure, site/side was marked, verified correct patient position, special equipment/implants available, medications/allergies/relevent history reviewed, required imaging  and test results available.  Performed.  The area was cleaned with iodine and alcohol swabs.    The right knee superior lateral suprapatellar pouch was injected using 3 cc 1% lidocaine 25 Gauge 1-1/2 inch needle.  The syringe was switched to mixture containing 1 cc's of 30 mg Toradol and 4 cc's of 0.25% bupivacaine was injected.  Ultrasound was used. Images were obtained in long views showing the injection.     A  sterile dressing was applied.  Patient did tolerate procedure well.     ASSESSMENT & PLAN:   Osteoarthritis of right knee Acute on chronic in nature.  Continues to lose weight.  Her BMI is close to considering a total knee arthroplasty. -Counseled on home exercise therapy and supportive care. -Toradol injection. -Pursue Zilretta injection.

## 2021-05-25 NOTE — Patient Instructions (Signed)
Good to see you Please use ice as needed   Please send me a message in MyChart with any questions or updates.  We'll call when the zilretta is in.   --Dr. Raeford Razor

## 2021-05-26 ENCOUNTER — Encounter: Payer: Commercial Managed Care - HMO | Attending: General Surgery | Admitting: Skilled Nursing Facility1

## 2021-05-26 ENCOUNTER — Other Ambulatory Visit: Payer: Self-pay

## 2021-05-26 DIAGNOSIS — E669 Obesity, unspecified: Secondary | ICD-10-CM | POA: Diagnosis present

## 2021-05-26 DIAGNOSIS — R69 Illness, unspecified: Secondary | ICD-10-CM | POA: Diagnosis not present

## 2021-05-26 DIAGNOSIS — E119 Type 2 diabetes mellitus without complications: Secondary | ICD-10-CM | POA: Insufficient documentation

## 2021-05-26 NOTE — Progress Notes (Signed)
Bariatric Nutrition Follow-Up Visit Medical Nutrition Therapy   Post-Operative sleeve Surgery Surgery Date: 10/14/2020  NUTRITION ASSESSMENT    Anthropometrics  Start weight at [facility]: 312 lbs (date: 02/20/2020) Today's weight: 248 lbs  Clinical  Medical hx: OSA, DM, HTN Medications:  see list, omeprezole Labs:    Lifestyle & Dietary Hx  Pt states she needs to take omeprazole daily.   Pt states she wants to do virtual for her future visits.  Pt states she does not like non starchy vegetables.   Pt states she does not check her blood sugar but does check her blood pressures which have been about 120/80.   Estimated daily fluid intake: unknown oz Estimated daily protein intake: 60+ g Supplements: multi (approved) and calcium Current average weekly physical activity: some walking some peddling stating she is not consistent but is putting in the effort to make changes    24-Hr Dietary Recall First Meal: scrambled egg or omelet or salmon with eggs or fajita wrap Snack:  imitatain crab meat Second Meal 12:30: protein shake or [place] wrap Snack:  cheese  Third Meal: [place] wrap + cheese or hamburger patty + baked beans or salad or seafood salad or chicken wings + salad (cheese, carrots, egg, raisins, iceberg lettuce) + potato Snack: sugar free popcicle or jello Beverages: water, water + flavorings, tea + splenda    Post-Op Goals/ Signs/ Symptoms Using straws: no Drinking while eating: no Chewing/swallowing difficulties: no Changes in vision: no Changes to mood/headaches: no Hair loss/changes to skin/nails: no Difficulty focusing/concentrating: no Sweating: no Dizziness/lightheadedness: no Palpitations: no  Carbonated/caffeinated beverages: no N/V/D/C/Gas: using metamucil daily, bowel movement daily Abdominal pain: no Dumping syndrome: no    NUTRITION DIAGNOSIS  Overweight/obesity ([place]-3.3) related to past poor dietary habits and physical inactivity as evidenced by  completed bariatric surgery and following dietary guidelines for continued weight loss and healthy nutrition status.     NUTRITION INTERVENTION: continued Nutrition counseling (C-1) and education (E-2) to facilitate bariatric surgery goals, including: The importance of consuming adequate calories as well as certain nutrients daily due to the body's need for essential vitamins, minerals, and fats The importance of daily physical activity and to reach a goal of at least 150 minutes of moderate to vigorous physical activity weekly (or as directed by their physician) due to benefits such as increased musculature and improved lab values The importance of intuitive eating specifically learning hunger-satiety cues and understanding the importance of learning a new body: The importance of mindful eating to avoid grazing behaviors  Importance of vegetables To have an overall healthy diet, adult men and women are recommended to consume anywhere from 2-3 cups of vegetables daily. Vegetables provide a wide range of vitamins and minerals such as vitamin A, vitamin C, potassium, and folic acid. According to the [employer], including fruit and vegetables daily may reduce the risk of cardiovascular disease, certain cancers, and other non-communicable diseases. Purpose of hydration: Water makes up over 50% of your total body water, and is part of many organs throughout the body. Water is essential to transport digested nutrients, regulate body temperature, rid the body of waste products, and protects joints and the spinal cord. When not properly hydrated you will begin to experience headaches, cramps and dizziness. Further dehydration can result in rapid heart rate, shock, oliguria, and may cause seizures.   https://www.merckmanuals.com/home/hormonal-and-metabolic-disordehttps://www.usgs.gov/special-topic/water-science-school/science/water-you-water-and-human-body?qt-science_center_objects=0#qt-science_center_objectsrs/water-balance/about-body-water HistoricalGrowth.gl https://www.stevens.org/ PimpTShirt.fi https://www.health.InvestmentBrowse.at  Goals: Added in fruit If you make a smoothie limit it to 8 ounces and only half a cu  of fruit with protein spinach and no juice use waster instead Still avoid starches at this point Ensure you have non starchy vegetables 2 times a day 7 days a week Include resistance activities 2-3 times a week  Handouts Provided Include  Phase 6   Learning Style & Readiness for Change Teaching method utilized: Visual & Auditory  Demonstrated degree of understanding via: Teach Back  Readiness Level: action Barriers to learning/adherence to lifestyle change: none identified   RD's Notes for Next Visit Assess adherence to pt chosen goals    MONITORING & EVALUATION Dietary intake, weekly physical activity, body weight  Next Steps Patient is to follow-up in 3 months

## 2021-06-02 ENCOUNTER — Ambulatory Visit (INDEPENDENT_AMBULATORY_CARE_PROVIDER_SITE_OTHER): Payer: Commercial Managed Care - HMO | Admitting: Family Medicine

## 2021-06-02 ENCOUNTER — Encounter: Payer: Self-pay | Admitting: Family Medicine

## 2021-06-02 ENCOUNTER — Ambulatory Visit: Payer: Self-pay

## 2021-06-02 VITALS — BP 128/70 | Ht 62.0 in | Wt 248.0 lb

## 2021-06-02 DIAGNOSIS — R69 Illness, unspecified: Secondary | ICD-10-CM | POA: Diagnosis not present

## 2021-06-02 DIAGNOSIS — M1711 Unilateral primary osteoarthritis, right knee: Secondary | ICD-10-CM

## 2021-06-02 NOTE — Patient Instructions (Signed)
Good to see you Please use ice as needed   Please send me a message in MyChart with any questions or updates.  Please see me back in 4-5 weeks.   --Dr. Raeford Razor

## 2021-06-02 NOTE — Progress Notes (Signed)
RUCHY WILDRICK - 66 y.o. female MRN [id number]  Date of birth: [birth year]  SUBJECTIVE:  Including CC & ROS.  No chief complaint on file.   JAKERA BEAUPRE is a 66 y.o. female that is here for a Zilretta injection.   Review of Systems See HPI   HISTORY: Past Medical, Surgical, Social, and Family History Reviewed & Updated per EMR.   Pertinent Historical Findings include:  Past Medical History:  Diagnosis Date   Bronchitis    Chronic bronchitis ([place])    Cystitis    Diabetes type 2, controlled ([place])    Hematuria    urologice eval, Dr Estill Dooms   History of chest pain    Hyperbilirubinemia    Hyperlipemia    Hypertelorism    Hypertension    Leukocytosis    Obesity    Osteoarthritis    right knee   Sleep apnea 2012   slight    Vaginal Pap smear, abnormal    Vitamin D deficiency 02/09/2018    Past Surgical History:  Procedure Laterality Date   CERVICAL POLYPECTOMY  07/15/2020   Procedure: POLYPECTOMY with Myosure;  Surgeon: Lavonia Drafts, MD;  Location: [place];  Service: Gynecology;;   COLONOSCOPY WITH PROPOFOL N/A 03/27/2015   Procedure: COLONOSCOPY WITH PROPOFOL;  Surgeon: Milus Banister, MD;  [facility];  Service: Endoscopy;  Laterality: N/A;   COLONOSCOPY WITH PROPOFOL N/A 10/02/2020   Procedure: COLONOSCOPY WITH PROPOFOL;  Surgeon: Milus Banister, MD;  [facility];  Service: Endoscopy;  Laterality: N/A;   COLPOSCOPY N/A 07/15/2020   Procedure: COLPOSCOPY;  Surgeon: Lavonia Drafts, MD;  Location: [place];  Service: Gynecology;  Laterality: N/A;   DILATATION & CURETTAGE/HYSTEROSCOPY WITH MYOSURE N/A 01/05/2017   Procedure: DILATATION & CURETTAGE/HYSTEROSCOPY WITH MYOSURE;  Surgeon: Thurnell Lose, MD;  Location: [facility];  Service: Gynecology;  Laterality: N/A;  PostMenopausal Bleeding   HYSTEROSCOPY WITH D & C N/A 05/22/2019   Procedure: DILATATION AND CURETTAGE /HYSTEROSCOPY, POLYPECTOMY WITH MYOSURE;  Surgeon: Lavonia Drafts,  MD;  [facility];  Service: Gynecology;  Laterality: N/A;   HYSTEROSCOPY WITH D & C N/A 07/15/2020   Procedure: DILATATION AND CURETTAGE /HYSTEROSCOPY;  Surgeon: Lavonia Drafts, MD;  Location: [place];  Service: Gynecology;  Laterality: N/A;   KNEE ARTHROSCOPY  04/2007   left knee   TOTAL KNEE ARTHROPLASTY Left 10/2008   Dr Roxine Caddy   UPPER GI ENDOSCOPY N/A 10/14/2020   Procedure: UPPER GI ENDOSCOPY;  Surgeon: [name], MD;  [facility];  Service: General;  Laterality: N/A;     PHYSICAL EXAM:  VS: BP 128/70 (BP Location: Left Arm, Patient Position: Sitting)    Ht 5\' 2"  (1.575 m)    Wt 248 lb (112.5 kg)    LMP 11/10/2011    BMI 45.36 kg/m  Physical Exam Gen: NAD, alert, cooperative with exam, well-appearing MSK:  Neurovascularly intact     Aspiration/Injection Procedure Note MALLERY HARSHMAN [birth year]  Procedure: Injection Indications: Right knee pain  Procedure Details Consent: Risks of procedure as well as the alternatives and risks of each were explained to the (patient/caregiver).  Consent for procedure obtained. Time Out: Verified patient identification, verified procedure, site/side was marked, verified correct patient position, special equipment/implants available, medications/allergies/relevent history reviewed, required imaging and test results available.  Performed.  The area was cleaned with iodine and alcohol swabs.    The right knee superior lateral suprapatellar pouch was injected using 3 cc of 1% lidocaine on a 25-gauge 1-1/2  inch needle.    The syringe was switched and a mixture containing 5 cc's of 32 mg Zilretta and 4 cc's of 0.25% bupivacaine was injected.  Ultrasound was used. Images were obtained in long views showing the injection.    A sterile dressing was applied.  Patient did tolerate procedure well.       ASSESSMENT & PLAN:   Osteoarthritis of right knee Completed Zilretta today.

## 2021-06-02 NOTE — Assessment & Plan Note (Signed)
Completed Zilretta today.

## 2021-06-04 ENCOUNTER — Encounter: Payer: Self-pay | Admitting: Family

## 2021-06-04 DIAGNOSIS — R69 Illness, unspecified: Secondary | ICD-10-CM | POA: Diagnosis not present

## 2021-06-04 DIAGNOSIS — E119 Type 2 diabetes mellitus without complications: Secondary | ICD-10-CM | POA: Diagnosis not present

## 2021-06-04 DIAGNOSIS — H2513 Age-related nuclear cataract, bilateral: Secondary | ICD-10-CM | POA: Diagnosis not present

## 2021-06-04 LAB — HM DIABETES EYE EXAM

## 2021-06-24 ENCOUNTER — Other Ambulatory Visit: Payer: Self-pay | Admitting: Family

## 2021-07-08 DIAGNOSIS — M1711 Unilateral primary osteoarthritis, right knee: Secondary | ICD-10-CM | POA: Diagnosis not present

## 2021-07-08 DIAGNOSIS — Z9884 Bariatric surgery status: Secondary | ICD-10-CM | POA: Diagnosis not present

## 2021-07-08 DIAGNOSIS — I10 Essential (primary) hypertension: Secondary | ICD-10-CM | POA: Diagnosis not present

## 2021-07-08 DIAGNOSIS — K439 Ventral hernia without obstruction or gangrene: Secondary | ICD-10-CM | POA: Diagnosis not present

## 2021-07-13 ENCOUNTER — Other Ambulatory Visit: Payer: Self-pay | Admitting: General Surgery

## 2021-07-13 DIAGNOSIS — Z9884 Bariatric surgery status: Secondary | ICD-10-CM

## 2021-07-13 DIAGNOSIS — K439 Ventral hernia without obstruction or gangrene: Secondary | ICD-10-CM

## 2021-07-15 ENCOUNTER — Ambulatory Visit: Payer: Commercial Managed Care - HMO | Admitting: Obstetrics & Gynecology

## 2021-08-04 ENCOUNTER — Encounter: Payer: Self-pay | Admitting: Family Medicine

## 2021-08-04 ENCOUNTER — Ambulatory Visit (INDEPENDENT_AMBULATORY_CARE_PROVIDER_SITE_OTHER): Payer: Medicare Other | Admitting: Family Medicine

## 2021-08-04 ENCOUNTER — Ambulatory Visit (INDEPENDENT_AMBULATORY_CARE_PROVIDER_SITE_OTHER): Payer: Medicare Other | Admitting: Family

## 2021-08-04 VITALS — BP 131/70 | HR 77 | Temp 97.8°F | Resp 16 | Wt 245.0 lb

## 2021-08-04 VITALS — BP 131/70 | Ht 62.0 in | Wt 245.0 lb

## 2021-08-04 DIAGNOSIS — E782 Mixed hyperlipidemia: Secondary | ICD-10-CM | POA: Diagnosis not present

## 2021-08-04 DIAGNOSIS — E119 Type 2 diabetes mellitus without complications: Secondary | ICD-10-CM

## 2021-08-04 DIAGNOSIS — M1711 Unilateral primary osteoarthritis, right knee: Secondary | ICD-10-CM | POA: Diagnosis not present

## 2021-08-04 DIAGNOSIS — I10 Essential (primary) hypertension: Secondary | ICD-10-CM | POA: Diagnosis not present

## 2021-08-04 DIAGNOSIS — Z96652 Presence of left artificial knee joint: Secondary | ICD-10-CM

## 2021-08-04 DIAGNOSIS — M7582 Other shoulder lesions, left shoulder: Secondary | ICD-10-CM | POA: Diagnosis not present

## 2021-08-04 DIAGNOSIS — R350 Frequency of micturition: Secondary | ICD-10-CM

## 2021-08-04 LAB — COMPREHENSIVE METABOLIC PANEL
ALT: 20 U/L (ref 0–35)
AST: 21 U/L (ref 0–37)
Albumin: 4.1 g/dL (ref 3.5–5.2)
Alkaline Phosphatase: 81 U/L (ref 39–117)
BUN: 16 mg/dL (ref 6–23)
CO2: 32 mEq/L (ref 19–32)
Calcium: 9.8 mg/dL (ref 8.4–10.5)
Chloride: 105 mEq/L (ref 96–112)
Creatinine, Ser: 0.74 mg/dL (ref 0.40–1.20)
GFR: 85.85 mL/min (ref >60.00)
Glucose, Bld: 93 mg/dL (ref 70–99)
Potassium: 3.9 mEq/L (ref 3.5–5.1)
Sodium: 142 mEq/L (ref 135–145)
Total Bilirubin: 0.4 mg/dL (ref 0.2–1.2)
Total Protein: 7.1 g/dL (ref 6.0–8.3)

## 2021-08-04 LAB — LIPID PANEL
Cholesterol: 169 mg/dL (ref 0–200)
HDL: 57.5 mg/dL (ref >39.00)
LDL Cholesterol: 97 mg/dL (ref 0–99)
NonHDL: 111.48
Total CHOL/HDL Ratio: 3
Triglycerides: 72 mg/dL (ref 0.0–149.0)
VLDL: 14.4 mg/dL (ref 0.0–40.0)

## 2021-08-04 LAB — HEMOGLOBIN A1C: Hgb A1c MFr Bld: 6 % (ref 4.6–6.5)

## 2021-08-04 MED ORDER — AMLODIPINE BESYLATE 5 MG PO TABS: 5.0000 mg | ORAL_TABLET | Freq: Every day | ORAL | 1 refills | Status: DC

## 2021-08-04 NOTE — Assessment & Plan Note (Addendum)
BP Readings from Last 3 Encounters:  ?08/04/21 131/70  ?06/02/21 128/70  ?05/25/21 124/80  ? ?BP at goal. Continue current doses of losartan and amlodipine.  ? ?

## 2021-08-04 NOTE — Assessment & Plan Note (Signed)
Significant improvement with the Zilretta.  Can repeat going forward until she reaches an appropriate BMI for arthroplasty. ?

## 2021-08-04 NOTE — Assessment & Plan Note (Signed)
Acutely occurring.  Only occurs when she is walking.  No injury. ?-Counseled on home exercise therapy and supportive care. ?-Could consider injection or physical therapy.  ?

## 2021-08-04 NOTE — Assessment & Plan Note (Addendum)
Lab Results  ?Component Value Date  ? HGBA1C 6.2 04/06/2021  ? HGBA1C 6.7 (H) 10/07/2020  ? HGBA1C 6.7 (H) 06/17/2020  ? ?Lab Results  ?Component Value Date  ? MICROALBUR 0.97 07/13/2011  ? [place] 86 06/17/2020  ? CREATININE 0.84 04/06/2021  ? ?Last A1C at goal. Continue DM diet. Commended her on her new exercise routine.  ?

## 2021-08-04 NOTE — Assessment & Plan Note (Signed)
Continues to have ongoing pain around the left knee. ?-Counseled on home exercise therapy and supportive care. ?-Pursue shockwave therapy. ?-Could consider nerve ablation or CT scan. ?

## 2021-08-04 NOTE — Progress Notes (Signed)
? ?Subjective:  ? ?By signing my name below, I, [name], attest that this documentation has been prepared under the direction and in the presence of Debbrah Alar NP, 08/04/2021  ? ? Patient ID: Leah Hampton, female    DOB: 16-Jul-1955, 66 y.o.   MRN: [id number] ? ?Chief Complaint  ?Patient presents with  ? Diabetes  ?  Here for follow up  ? Hypertension  ?  Here for follow up  ? ? ?HPI ?Patient is in today for an office visit.  ? ?Frequency - Patient complains of frequent urination that begun after being discharged from the hospital on 10/14/2021. ? ?Refills - She is requesting a refill of 5 MG of Amlodipine.  ? ?Blood Pressure - Her blood pressure is improving. She is taking 100 MG of Losartan and 5 MG of Amlodipine.  ? ?BP Readings from Last 3 Encounters:  ?08/04/21 131/70  ?06/02/21 128/70  ?05/25/21 124/80  ? ?Exercise - She is exercising regularly.  ? ?Health Maintenance Due  ?Topic Date Due  ? Zoster Vaccines- Shingrix (1 of 2) Never done  ? TETANUS/TDAP  07/03/2018  ? ? ?Past Medical History:  ?Diagnosis Date  ? Bronchitis   ? Chronic bronchitis ([place])   ? Cystitis   ? Diabetes type 2, controlled ([place])   ? Hematuria   ? urologice eval, Dr Estill Dooms  ? History of chest pain   ? Hyperbilirubinemia   ? Hyperlipemia   ? Hypertelorism   ? Hypertension   ? Leukocytosis   ? Obesity   ? Osteoarthritis   ? right knee  ? Sleep apnea 2012  ? slight   ? Vaginal Pap smear, abnormal   ? Vitamin D deficiency 02/09/2018  ? ? ?Past Surgical History:  ?Procedure Laterality Date  ? CERVICAL POLYPECTOMY  07/15/2020  ? Procedure: POLYPECTOMY with Myosure;  Surgeon: Lavonia Drafts, MD;  Location: [facility];  Service: Gynecology;;  ? COLONOSCOPY WITH PROPOFOL N/A 03/27/2015  ? Procedure: COLONOSCOPY WITH PROPOFOL;  Surgeon: Milus Banister, MD;  [facility];  Service: Endoscopy;  Laterality: N/A;  ? COLONOSCOPY WITH PROPOFOL N/A 10/02/2020  ? Procedure: COLONOSCOPY WITH PROPOFOL;  Surgeon: Milus Banister,  MD;  [facility];  Service: Endoscopy;  Laterality: N/A;  ? COLPOSCOPY N/A 07/15/2020  ? Procedure: COLPOSCOPY;  Surgeon: Lavonia Drafts, MD;  Location: [facility];  Service: Gynecology;  Laterality: N/A;  ? DILATATION & CURETTAGE/HYSTEROSCOPY WITH MYOSURE N/A 01/05/2017  ? Procedure: [place];  Surgeon: Thurnell Lose, MD;  Location: [facility];  Service: Gynecology;  Laterality: N/A;  PostMenopausal Bleeding  ? HYSTEROSCOPY WITH D & C N/A 05/22/2019  ? Procedure: DILATATION AND CURETTAGE /HYSTEROSCOPY, POLYPECTOMY WITH MYOSURE;  Surgeon: Lavonia Drafts, MD;  [facility];  Service: Gynecology;  Laterality: N/A;  ? HYSTEROSCOPY WITH D & C N/A 07/15/2020  ? Procedure: DILATATION AND CURETTAGE /HYSTEROSCOPY;  Surgeon: Lavonia Drafts, MD;  Location: [facility];  Service: Gynecology;  Laterality: N/A;  ? KNEE ARTHROSCOPY  04/2007  ? left knee  ? TOTAL KNEE ARTHROPLASTY Left 10/2008  ? Dr Roxine Caddy  ? UPPER GI ENDOSCOPY N/A 10/14/2020  ? Procedure: UPPER GI ENDOSCOPY;  Surgeon: [name], MD;  [facility];  Service: General;  Laterality: N/A;  ? ? ?Family History  ?Problem Relation Age of Onset  ? Heart failure Mother   ? Arthritis Mother   ? Hypertension Mother   ? Stroke Mother   ? Diabetes Mellitus II Mother   ?  Heart attack Father 49  ? Diabetes Other   ? Arthritis Other   ? Stroke Other   ? Coronary artery disease Other   ? Hypertension Sister   ?     x 2  ? CVA Sister   ? CAD Sister 47  ? Diabetes Mellitus II Sister   ? Cirrhosis Brother   ? Alcohol abuse Brother   ? Colon cancer Neg Hx   ? ? ?Social History  ? ?Socioeconomic History  ? Marital status: Single  ?  Spouse name: Not on file  ? Number of children: 2  ? Years of education: Not on file  ? Highest education level: Not on file  ?Occupational History  ? Occupation: disabled  ?Tobacco Use  ? Smoking status: Never  ? Smokeless tobacco: Never  ?Vaping Use  ? Vaping Use: Never used  ?Substance  and Sexual Activity  ? Alcohol use: No  ? Drug use: No  ? Sexual activity: Not Currently  ?Other Topics Concern  ? Not on file  ?Social History Narrative  ? Regular exercise:  No  ? Lives alone  ? Has 2 grown sons, both local.  1 grandson  ? On disability due to her knee pain  ? Completed 12th grade  ? Enjoys television  ? No pets.   ? ?Social Determinants of Health  ? ?Financial Resource Strain: Low Risk   ? Difficulty of Paying Living Expenses: Not hard at all  ?Food Insecurity: Not on file  ?Transportation Needs: No Transportation Needs  ? Lack of Transportation (Medical): No  ? Lack of Transportation (Non-Medical): No  ?Physical Activity: Insufficiently Active  ? Days of Exercise per Week: 1 day  ? Minutes of Exercise per Session: 20 min  ?Stress: No Stress Concern Present  ? Feeling of Stress : Not at all  ?Social Connections: Moderately Isolated  ? Frequency of Communication with Friends and Family: More than three times a week  ? Frequency of Social Gatherings with Friends and Family: Once a week  ? Attends Religious Services: 1 to 4 times per year  ? Active Member of Clubs or Organizations: No  ? Attends Archivist Meetings: Never  ? Marital Status: Never married  ?Intimate Partner Violence: Not At Risk  ? Fear of Current or Ex-Partner: No  ? Emotionally Abused: No  ? Physically Abused: No  ? Sexually Abused: No  ? ? ?Outpatient Medications Prior to Visit  ?Medication Sig Dispense Refill  ? albuterol (VENTOLIN HFA) 108 (90 Base) MCG/ACT inhaler USE 2 INHALATIONS BY MOUTH EVERY 6 HOURS AS NEEDED FOR WHEEZING  OR SHORTNESS OF BREATH 34 g 3  ? blood glucose meter kit and supplies KIT Dispense based on patient and insurance preference. Use up to four times daily as directed. (FOR ICD-9 250.00, 250.01). 1 each 0  ? COVID-19 mRNA bivalent vaccine, Pfizer, (PFIZER COVID-19 VAC BIVALENT) injection Inject into the muscle. 0.3 mL 0  ? diclofenac Sodium (VOLTAREN) 1 % GEL Apply 2 g topically 4 (four) times  daily. 100 g 1  ? FLOVENT HFA 110 MCG/ACT inhaler USE 2 INHALATIONS BY MOUTH  TWICE DAILY 36 g 3  ? losartan (COZAAR) 100 MG tablet TAKE 1 TABLET BY MOUTH  DAILY 90 tablet 3  ? Multiple Vitamin (MULTIVITAMIN WITH MINERALS) TABS tablet Take 1 tablet by mouth daily. Vitafusion    ? omeprazole (PRILOSEC) 20 MG capsule TAKE 1 CAPSULE BY MOUTH  DAILY 90 capsule 3  ? polyethylene glycol (MIRALAX /  GLYCOLAX) 17 g packet Take 17 g by mouth daily as needed for moderate constipation or severe constipation.    ? pravastatin (PRAVACHOL) 80 MG tablet Take 1 tablet (80 mg total) by mouth daily. 90 tablet 1  ? psyllium (METAMUCIL) 58.6 % powder Take 1 packet by mouth daily as needed (constipation).    ? valACYclovir (VALTREX) 1000 MG tablet Take 1 tablet (1,000 mg total) by mouth daily. 90 tablet 1  ? amLODipine (NORVASC) 5 MG tablet Take 1 tablet (5 mg total) by mouth daily. 90 tablet 1  ? triamcinolone cream (KENALOG) 0.1 % Apply 1 application topically 2 (two) times daily. 30 g 0  ? ?No facility-administered medications prior to visit.  ? ? ?Allergies  ?Allergen Reactions  ? Sulfamethoxazole-Trimethoprim [Bactrim] Itching  ? Codeine Nausea And Vomiting and Anxiety  ? ? ?Review of Systems  ?Genitourinary:  Positive for frequency.  ? ?   ?Objective:  ?  ?Physical Exam ?Constitutional:   ?   General: She is not in acute distress. ?   Appearance: Normal appearance. She is not ill-appearing.  ?HENT:  ?   Head: Normocephalic and atraumatic.  ?   Right Ear: External ear normal.  ?   Left Ear: External ear normal.  ?Eyes:  ?   Extraocular Movements: Extraocular movements intact.  ?   Pupils: Pupils are equal, round, and reactive to light.  ?Cardiovascular:  ?   Rate and Rhythm: Normal rate and regular rhythm.  ?   Heart sounds: Normal heart sounds. No murmur heard. ?  No gallop.  ?Pulmonary:  ?   Effort: Pulmonary effort is normal. No respiratory distress.  ?   Breath sounds: Normal breath sounds. No wheezing or rales.  ?Skin: ?    General: Skin is warm and dry.  ?Neurological:  ?   Mental Status: She is alert and oriented to person, place, and time.  ?Psychiatric:     ?   Mood and Affect: Mood normal.     ?   Behavior: Behavior normal.

## 2021-08-04 NOTE — Patient Instructions (Signed)
Good to see you ?Please try voltaren and the exercises for the shoulder  ?We can do the zilretta for the right knee on April 17 or after  ?Please send me a message in MyChart with any questions or updates.  ?Please see me back to start shockwave therapy on the left knee.  ? ?--Dr. Raeford Razor ? ?

## 2021-08-04 NOTE — Assessment & Plan Note (Addendum)
Lab Results  ?Component Value Date  ? CHOL 149 06/17/2020  ? HDL 44.00 06/17/2020  ? [place] 86 06/17/2020  ? TRIG 98.0 06/17/2020  ? CHOLHDL 3 06/17/2020  ? ?Last ldl at goal. Continues pravastatin. Obtain follow up lipid panel.  ?

## 2021-08-04 NOTE — Progress Notes (Signed)
?Leah Hampton - 66 y.o. female MRN [id number]  Date of birth: [birth year] ? ?SUBJECTIVE:  Including CC & ROS.  ?No chief complaint on file. ? ? ?Leah Hampton is a 66 y.o. female that is following up for her right knee pain.  She also experiences left knee and left shoulder pain.  She has done well in the right knee with her Zilretta injection.  Her left knee has been giving her issues from time to time.  Also experiences left shoulder pain when she is walking. ? ? ?Review of Systems ?See HPI  ? ?HISTORY: Past Medical, Surgical, Social, and Family History Reviewed & Updated per EMR.   ?Pertinent Historical Findings include: ? ?Past Medical History:  ?Diagnosis Date  ? Bronchitis   ? Chronic bronchitis ([place])   ? Cystitis   ? Diabetes type 2, controlled ([place])   ? Hematuria   ? urologice eval, Dr Estill Dooms  ? History of chest pain   ? Hyperbilirubinemia   ? Hyperlipemia   ? Hypertelorism   ? Hypertension   ? Leukocytosis   ? Obesity   ? Osteoarthritis   ? right knee  ? Sleep apnea 2012  ? slight   ? Vaginal Pap smear, abnormal   ? Vitamin D deficiency 02/09/2018  ? ? ?Past Surgical History:  ?Procedure Laterality Date  ? CERVICAL POLYPECTOMY  07/15/2020  ? Procedure: POLYPECTOMY with Myosure;  Surgeon: Lavonia Drafts, MD;  Location: [facility];  Service: Gynecology;;  ? COLONOSCOPY WITH PROPOFOL N/A 03/27/2015  ? Procedure: COLONOSCOPY WITH PROPOFOL;  Surgeon: Milus Banister, MD;  [facility];  Service: Endoscopy;  Laterality: N/A;  ? COLONOSCOPY WITH PROPOFOL N/A 10/02/2020  ? Procedure: COLONOSCOPY WITH PROPOFOL;  Surgeon: Milus Banister, MD;  [facility];  Service: Endoscopy;  Laterality: N/A;  ? COLPOSCOPY N/A 07/15/2020  ? Procedure: COLPOSCOPY;  Surgeon: Lavonia Drafts, MD;  Location: [facility];  Service: Gynecology;  Laterality: N/A;  ? DILATATION & CURETTAGE/HYSTEROSCOPY WITH MYOSURE N/A 01/05/2017  ? Procedure: [place];  Surgeon: Thurnell Lose, MD;  Location: [facility];  Service: Gynecology;  Laterality: N/A;  PostMenopausal Bleeding  ? HYSTEROSCOPY WITH D & C N/A 05/22/2019  ? Procedure: DILATATION AND CURETTAGE /HYSTEROSCOPY, POLYPECTOMY WITH MYOSURE;  Surgeon: Lavonia Drafts, MD;  [facility];  Service: Gynecology;  Laterality: N/A;  ? HYSTEROSCOPY WITH D & C N/A 07/15/2020  ? Procedure: DILATATION AND CURETTAGE /HYSTEROSCOPY;  Surgeon: Lavonia Drafts, MD;  Location: [facility];  Service: Gynecology;  Laterality: N/A;  ? KNEE ARTHROSCOPY  04/2007  ? left knee  ? TOTAL KNEE ARTHROPLASTY Left 10/2008  ? Dr Roxine Caddy  ? UPPER GI ENDOSCOPY N/A 10/14/2020  ? Procedure: UPPER GI ENDOSCOPY;  Surgeon: [name], MD;  [facility];  Service: General;  Laterality: N/A;  ? ? ? ?PHYSICAL EXAM:  ?VS: BP 131/70   Ht '5\' 2"'$  (1.575 m)   Wt 245 lb (111.1 kg)   LMP 11/10/2011   BMI 44.81 kg/m?  ?Physical Exam ?Gen: NAD, alert, cooperative with exam, well-appearing ?MSK:  ?Neurovascularly intact   ? ? ? ? ?ASSESSMENT & PLAN:  ? ?Rotator cuff tendinitis, left ?Acutely occurring.  Only occurs when she is walking.  No injury. ?-Counseled on home exercise therapy and supportive care. ?-Could consider injection or physical therapy.  ? ?Osteoarthritis of right knee ?Significant improvement with the Zilretta.  Can repeat going forward until she reaches an appropriate BMI for arthroplasty. ? ?History  of total left knee replacement ?Continues to have ongoing pain around the left knee. ?-Counseled on home exercise therapy and supportive care. ?-Pursue shockwave therapy. ?-Could consider nerve ablation or CT scan. ? ? ? ? ?

## 2021-08-05 LAB — URINE CULTURE
MICRO NUMBER:: 13158604
SPECIMEN QUALITY:: ADEQUATE

## 2021-08-07 ENCOUNTER — Ambulatory Visit
Admission: RE | Admit: 2021-08-07 | Discharge: 2021-08-07 | Disposition: A | Discharge status: Home / Self Care | Payer: Medicare Other | Source: Ambulatory Visit | Attending: General Surgery | Admitting: General Surgery

## 2021-08-07 DIAGNOSIS — K439 Ventral hernia without obstruction or gangrene: Secondary | ICD-10-CM | POA: Diagnosis not present

## 2021-08-07 DIAGNOSIS — K8020 Calculus of gallbladder without cholecystitis without obstruction: Secondary | ICD-10-CM | POA: Diagnosis not present

## 2021-08-07 DIAGNOSIS — Z9884 Bariatric surgery status: Secondary | ICD-10-CM

## 2021-08-07 DIAGNOSIS — I700 Atherosclerosis of aorta: Secondary | ICD-10-CM | POA: Diagnosis not present

## 2021-08-07 DIAGNOSIS — K5730 Diverticulosis of large intestine without perforation or abscess without bleeding: Secondary | ICD-10-CM | POA: Diagnosis not present

## 2021-08-07 MED ORDER — IOPAMIDOL (ISOVUE-300) INJECTION 61%
100.0000 mL | Freq: Once | INTRAVENOUS | Status: AC | PRN
  Administered 2021-08-07: 100 mL via INTRAVENOUS

## 2021-08-11 ENCOUNTER — Ambulatory Visit (INDEPENDENT_AMBULATORY_CARE_PROVIDER_SITE_OTHER): Payer: Medicare Other | Admitting: Family Medicine

## 2021-08-11 ENCOUNTER — Encounter: Payer: Self-pay | Admitting: Family Medicine

## 2021-08-11 DIAGNOSIS — Z96652 Presence of left artificial knee joint: Secondary | ICD-10-CM

## 2021-08-11 NOTE — Assessment & Plan Note (Signed)
Completed shockwave therapy  

## 2021-08-11 NOTE — Progress Notes (Signed)
?[name] - 66 y.o. female MRN [id number]  Date of birth: 1955-06-02 ? ?SUBJECTIVE:  Including CC & ROS.  ?No chief complaint on file. ? ? ?Leah Hampton is a 66 y.o. female that is  here for shockwave therapy. ? ? ? ?Review of Systems ?See HPI  ? ?HISTORY: Past Medical, Surgical, Social, and Family History Reviewed & Updated per EMR.   ?Pertinent Historical Findings include: ? ?Past Medical History:  ?Diagnosis Date  ? Bronchitis   ? Chronic bronchitis ([place])   ? Cystitis   ? Diabetes type 2, controlled ([place])   ? Hematuria   ? urologice eval, Dr Estill Dooms  ? History of chest pain   ? Hyperbilirubinemia   ? Hyperlipemia   ? Hypertelorism   ? Hypertension   ? Leukocytosis   ? Obesity   ? Osteoarthritis   ? right knee  ? Sleep apnea 2012  ? slight   ? Vaginal Pap smear, abnormal   ? Vitamin D deficiency 02/09/2018  ? ? ?Past Surgical History:  ?Procedure Laterality Date  ? CERVICAL POLYPECTOMY  07/15/2020  ? Procedure: POLYPECTOMY with Myosure;  Surgeon: Lavonia Drafts, MD;  Location: [facility];  Service: Gynecology;;  ? COLONOSCOPY WITH PROPOFOL N/A 03/27/2015  ? Procedure: COLONOSCOPY WITH PROPOFOL;  Surgeon: Milus Banister, MD;  Location: [facility];  Service: Endoscopy;  Laterality: N/A;  ? COLONOSCOPY WITH PROPOFOL N/A 10/02/2020  ? Procedure: COLONOSCOPY WITH PROPOFOL;  Surgeon: Milus Banister, MD;  Location: [facility];  Service: Endoscopy;  Laterality: N/A;  ? COLPOSCOPY N/A 07/15/2020  ? Procedure: COLPOSCOPY;  Surgeon: Lavonia Drafts, MD;  Location: [facility];  Service: Gynecology;  Laterality: N/A;  ? DILATATION & CURETTAGE/HYSTEROSCOPY WITH MYOSURE N/A 01/05/2017  ? Procedure: [place];  Surgeon: Thurnell Lose, MD;  Location: [facility];  Service: Gynecology;  Laterality: N/A;  PostMenopausal Bleeding  ? HYSTEROSCOPY WITH D & C N/A 05/22/2019  ? Procedure: DILATATION AND CURETTAGE /HYSTEROSCOPY, POLYPECTOMY WITH MYOSURE;  Surgeon: Lavonia Drafts,  MD;  Location: [facility];  Service: Gynecology;  Laterality: N/A;  ? HYSTEROSCOPY WITH D & C N/A 07/15/2020  ? Procedure: DILATATION AND CURETTAGE /HYSTEROSCOPY;  Surgeon: Lavonia Drafts, MD;  Location: [facility];  Service: Gynecology;  Laterality: N/A;  ? KNEE ARTHROSCOPY  04/2007  ? left knee  ? TOTAL KNEE ARTHROPLASTY Left 10/2008  ? Dr Roxine Caddy  ? UPPER GI ENDOSCOPY N/A 10/14/2020  ? Procedure: UPPER GI ENDOSCOPY;  Surgeon: [name], MD;  Location: [facility];  Service: General;  Laterality: N/A;  ? ? ? ?PHYSICAL EXAM:  ?VS: Ht '5\' 2"'$  (1.575 m)   Wt 245 lb (111.1 kg)   LMP 11/10/2011   BMI 44.81 kg/m?  ?Physical Exam ?Gen: NAD, alert, cooperative with exam, well-appearing ?MSK:  ?Neurovascularly intact   ? ?ECSWT Note ?[name] ?Aug 03, 1955 ? ?Procedure: ECSWT ?Indications: left knee pain ? ?Procedure Details ?Consent: Risks of procedure as well as the alternatives and risks of each were explained to the (patient/caregiver).  Consent for procedure obtained. ?Time Out: Verified patient identification, verified procedure, site/side was marked, verified correct patient position, special equipment/implants available, medications/allergies/relevent history reviewed, required imaging and test results available.  Performed.  The area was cleaned with iodine and alcohol swabs.   ? ?The left knee was targeted for Extracorporeal shockwave therapy.  ? ?Preset: patellar tendinopathy  ?Power Level: 90 ?Frequency: 10 ?Impulse/cycles: 2800 ?Head size: large  ?Session: 1st ? ?Patient did tolerate procedure well. ? ? ? ?  ASSESSMENT & PLAN:  ? ?History of total left knee replacement ?Completed shockwave therapy ? ? ? ? ?

## 2021-08-17 ENCOUNTER — Telehealth: Payer: Self-pay | Admitting: *Deleted

## 2021-08-17 NOTE — Telephone Encounter (Signed)
Received patient's sob for Zilretta 32 mg right knee injection. ? ?No PA is required. Patient is responsible for 20% of cost of Zilretta and office visit. The remaining 80%  is covered by plan. Pt informed. She wished to have Zilretta injection on her 09/02/21 OV.   ?

## 2021-09-01 ENCOUNTER — Ambulatory Visit (INDEPENDENT_AMBULATORY_CARE_PROVIDER_SITE_OTHER): Payer: Medicare Other | Admitting: Podiatry

## 2021-09-01 DIAGNOSIS — L308 Other specified dermatitis: Secondary | ICD-10-CM | POA: Diagnosis not present

## 2021-09-01 DIAGNOSIS — L989 Disorder of the skin and subcutaneous tissue, unspecified: Secondary | ICD-10-CM | POA: Diagnosis not present

## 2021-09-01 DIAGNOSIS — B353 Tinea pedis: Secondary | ICD-10-CM | POA: Diagnosis not present

## 2021-09-01 MED ORDER — DESOXIMETASONE 0.25 % EX CREA: 1.0000 "application " | TOPICAL_CREAM | Freq: Two times a day (BID) | CUTANEOUS | 0 refills | Status: DC

## 2021-09-01 NOTE — Progress Notes (Signed)
Subjective: ?66 year old female presents the office today for skin rash bilaterally.  She states that it has not gone away and is continued.  Currently denies any open sores.  No swelling or redness or any drainage.  She has no other concerns today. ?Last A1c was 6.0 on 08/04/2021  ? ?Objective: ?AAO x3, NAD ?DP/PT pulses palpable bilaterally, CRT less than 3 seconds ?Flat rash, scaly, erythematous base present on the medial aspect of the right foot and to a lesser degree the left foot.  There is no open lesions.  No swelling redness or drainage. ?No pain with calf compression, swelling, warmth, erythema ? ? ? ? ?Assessment: ?Chronic skin rash right foot ? ?Plan: ?-All treatment options discussed with the patient including all alternatives, risks, complications.  ?-Given ongoing nature to discuss punch biopsy for further evaluation and diagnosis.  Discussed risks and she wishes to proceed and consent was signed.  I prepped the skin with alcohol and 1 cc lidocaine with epinephrine was infiltrated subdermally.  I cleansed the skin with Betadine.  I then utilized a 4 mm punch biopsy to remove a wedge of skin and this was sent for pathology.  I irrigated with alcohol and hemostasis achieved.  Antibiotic ointment and a bandage applied.  She tolerated the procedure well any complications.  Postprocedure instructions discussed. ?-Triamcinolone cream for now however do not apply to the procedure site. ? ?Trula Slade DPM ? ?

## 2021-09-01 NOTE — Patient Instructions (Signed)
You can remove the bandage tomorrow and start to wash with soap and water. Dry well and apply a small amount of antibiotic ointment and bandage. Monitor for any signs/symptoms of infection. Call the office immediately if any occur or go directly to the emergency room. Call with any questions/concerns. ? ?Do no put the steroid cream on the biopsy site.  ?

## 2021-09-02 ENCOUNTER — Ambulatory Visit (INDEPENDENT_AMBULATORY_CARE_PROVIDER_SITE_OTHER): Payer: Medicare Other | Admitting: Family Medicine

## 2021-09-02 ENCOUNTER — Ambulatory Visit (INDEPENDENT_AMBULATORY_CARE_PROVIDER_SITE_OTHER): Payer: Medicare Other | Admitting: Obstetrics & Gynecology

## 2021-09-02 ENCOUNTER — Encounter: Payer: Self-pay | Admitting: Family Medicine

## 2021-09-02 ENCOUNTER — Other Ambulatory Visit (HOSPITAL_COMMUNITY)
Admission: RE | Admit: 2021-09-02 | Discharge: 2021-09-02 | Disposition: A | Discharge status: Home / Self Care | Payer: Medicare Other | Source: Ambulatory Visit | Attending: Obstetrics & Gynecology | Admitting: Obstetrics & Gynecology

## 2021-09-02 ENCOUNTER — Ambulatory Visit: Payer: Self-pay

## 2021-09-02 ENCOUNTER — Encounter: Payer: Self-pay | Admitting: Obstetrics & Gynecology

## 2021-09-02 VITALS — BP 120/59 | HR 75 | Ht 63.0 in | Wt 242.0 lb

## 2021-09-02 VITALS — BP 120/59 | Ht 63.0 in | Wt 242.0 lb

## 2021-09-02 DIAGNOSIS — Z01419 Encounter for gynecological examination (general) (routine) without abnormal findings: Secondary | ICD-10-CM

## 2021-09-02 DIAGNOSIS — N950 Postmenopausal bleeding: Secondary | ICD-10-CM

## 2021-09-02 DIAGNOSIS — Z1151 Encounter for screening for human papillomavirus (HPV): Secondary | ICD-10-CM | POA: Diagnosis not present

## 2021-09-02 DIAGNOSIS — M1711 Unilateral primary osteoarthritis, right knee: Secondary | ICD-10-CM | POA: Diagnosis not present

## 2021-09-02 NOTE — Progress Notes (Signed)
Pt here for annual appt ?Pt had bleeding that lasted for a week in Feb 2023 ?Last mammogram- 12/25/20- normal ?Last pap- 06/09/20- [facility] ?

## 2021-09-02 NOTE — Assessment & Plan Note (Signed)
Completed Zilretta injection 

## 2021-09-02 NOTE — Progress Notes (Signed)
?Leah Hampton - 66 y.o. female MRN [id number]  Date of birth: 1955/11/19 ? ?SUBJECTIVE:  Including CC & ROS.  ?No chief complaint on file. ? ? ?Leah Hampton is a 66 y.o. female that is here for Zilretta injection. ? ? ? ?Review of Systems ?See HPI  ? ?HISTORY: Past Medical, Surgical, Social, and Family History Reviewed & Updated per EMR.   ?Pertinent Historical Findings include: ? ?Past Medical History:  ?Diagnosis Date  ? Bronchitis   ? Chronic bronchitis ([place])   ? Cystitis   ? Diabetes type 2, controlled (Leah Hampton)   ? Hematuria   ? urologice eval, Dr Estill Dooms  ? History of chest pain   ? Hyperbilirubinemia   ? Hyperlipemia   ? Hypertelorism   ? Hypertension   ? Leukocytosis   ? Obesity   ? Osteoarthritis   ? right knee  ? Sleep apnea 2012  ? slight   ? Vaginal Pap smear, abnormal   ? Vitamin D deficiency 02/09/2018  ? ? ?Past Surgical History:  ?Procedure Laterality Date  ? CERVICAL POLYPECTOMY  07/15/2020  ? Procedure: POLYPECTOMY with Myosure;  Surgeon: Lavonia Drafts, MD;  Location: [facility];  Service: Gynecology;;  ? COLONOSCOPY WITH PROPOFOL N/A 03/27/2015  ? Procedure: COLONOSCOPY WITH PROPOFOL;  Surgeon: Milus Banister, MD;  [facility];  Service: Endoscopy;  Laterality: N/A;  ? COLONOSCOPY WITH PROPOFOL N/A 10/02/2020  ? Procedure: COLONOSCOPY WITH PROPOFOL;  Surgeon: Milus Banister, MD;  [facility];  Service: Endoscopy;  Laterality: N/A;  ? COLPOSCOPY N/A 07/15/2020  ? Procedure: COLPOSCOPY;  Surgeon: Lavonia Drafts, MD;  Location: [facility];  Service: Gynecology;  Laterality: N/A;  ? DILATATION & CURETTAGE/HYSTEROSCOPY WITH MYOSURE N/A 01/05/2017  ? Procedure: [place];  Surgeon: Thurnell Lose, MD;  Location: [facility];  Service: Gynecology;  Laterality: N/A;  PostMenopausal Bleeding  ? HYSTEROSCOPY WITH D & C N/A 05/22/2019  ? Procedure: DILATATION AND CURETTAGE /HYSTEROSCOPY, POLYPECTOMY WITH MYOSURE;  Surgeon: Lavonia Drafts,  MD;  [facility];  Service: Gynecology;  Laterality: N/A;  ? HYSTEROSCOPY WITH D & C N/A 07/15/2020  ? Procedure: DILATATION AND CURETTAGE /HYSTEROSCOPY;  Surgeon: Lavonia Drafts, MD;  Location: [facility];  Service: Gynecology;  Laterality: N/A;  ? KNEE ARTHROSCOPY  04/2007  ? left knee  ? TOTAL KNEE ARTHROPLASTY Left 10/2008  ? Dr Roxine Caddy  ? UPPER GI ENDOSCOPY N/A 10/14/2020  ? Procedure: UPPER GI ENDOSCOPY;  Surgeon: [name], MD;  [facility];  Service: General;  Laterality: N/A;  ? ? ? ?PHYSICAL EXAM:  ?VS: BP (!) 120/59 (BP Location: Left Arm, Patient Position: Sitting)   Ht '5\' 3"'$  (1.6 m)   Wt 242 lb (109.8 kg)   LMP 11/10/2011   BMI 42.87 kg/m?  ?Physical Exam ?Gen: NAD, alert, cooperative with exam, well-appearing ?MSK:  ?Neurovascularly intact   ? ? ?Aspiration/Injection Procedure Note ?[name] ?04-24-1956 ? ?Procedure: Injection ?Indications: Right knee pain ? ?Procedure Details ?Consent: Risks of procedure as well as the alternatives and risks of each were explained to the (patient/caregiver).  Consent for procedure obtained. ?Time Out: Verified patient identification, verified procedure, site/side was marked, verified correct patient position, special equipment/implants available, medications/allergies/relevent history reviewed, required imaging and test results available.  Performed.  The area was cleaned with iodine and alcohol swabs.   ? ?The right knee superior lateral suprapatellar pouch was injected using 3 cc of 1% lidocaine on a 22-gauge 1-1/2 inch needle.  The syringe was switched and a mixture containing 5 cc's of 32 mg Zilretta and 4 cc's of 0.25% bupivacaine was injected.  Ultrasound was used. Images were obtained in long views showing the injection.  ? ? ?A sterile dressing was applied. ? ?Patient did tolerate procedure well. ? ?  ? ? ? ?ASSESSMENT & PLAN:  ? ?Osteoarthritis of right knee ?Completed Zilretta injection ? ? ? ? ?

## 2021-09-02 NOTE — Patient Instructions (Signed)
Good to see you ?Please use ice as needed  ?Please send me a message in MyChart with any questions or updates.  ?Please see me back in 3 months.  ? ?--Dr. Levar Fayson ? ?

## 2021-09-02 NOTE — Progress Notes (Signed)
Subjective:  ?  ? Leah Hampton is a 66 y.o. female here for a routine exam.  Current complaints: Pt reports 1 week of painless bleeding in Feb that was not heavy but, was obvious red blood.  She denies assoc sx. Pt denies unintentional weight loss.   Pt denies any new GYN issues.  ?  ?Gynecologic History ?Patient's last menstrual period was 11/10/2011. ?Contraception: post menopausal status ?Last Pap: 06/09/2020. Results were: [facility]  ?Surg path ?FINAL MICROSCOPIC DIAGNOSIS:  ? ?A. ENDOCERVIX, CURETTAGE:  ?- Benign squamous and endocervical epithelium.  ? ?B. ENDOMETRIUM, CURETTAGE:  ?- Benign endometrial polyp.  ?- Benign squamous epithelium.   ?Last mammogram: 12/25/20. Results were: normal ? ?Obstetric History ?OB History  ?Gravida Para Term Preterm AB Living  ?'2 2 2        '$ ?SAB IAB Ectopic Multiple Live Births  ?        2  ?  ?# Outcome Date GA Lbr Len/2nd Weight Sex Delivery Anes PTL Lv  ?2 Term      Vag-Spont     ?1 Term      Vag-Spont     ? ? ?The following portions of the patient's history were reviewed and updated as appropriate: allergies, current medications, past family history, past medical history, past social history, past surgical history, and problem list. ? ?Review of Systems ?Pertinent items are noted in HPI.  ?  ?Objective:  ?BP (!) 120/59   Pulse 75   Ht '5\' 3"'$  (1.6 m)   Wt 242 lb (109.8 kg)   LMP 11/10/2011   BMI 42.87 kg/m?  ?General Appearance:    Alert, cooperative, no distress, appears stated age  ?Head:    Normocephalic, without obvious abnormality, atraumatic  ?Eyes:    conjunctiva/corneas clear, EOM's intact, both eyes  ?Ears:    Normal external ear canals, both ears  ?Nose:   Nares normal, septum midline, mucosa normal, no drainage    or sinus tenderness  ?Throat:   Lips, mucosa, and tongue normal; teeth and gums normal  ?Neck:   Supple, symmetrical, trachea midline, no adenopathy;  ?  thyroid:  no enlargement/tenderness/nodules  ?Back:     Symmetric, no curvature, ROM normal, no  CVA tenderness  ?Lungs:     respirations unlabored  ?Chest Wall:    No tenderness or deformity  ? Heart:    Regular rate and rhythm  ?Breast Exam:    No tenderness, masses, or nipple abnormality  ?Abdomen:     Soft, non-tender, bowel sounds active all four quadrants,  ?  no masses, no organomegaly  ?Genitalia:    Normal female without lesion, discharge or tenderness  ?   ?Extremities:   Extremities normal, atraumatic, no cyanosis or edema  ?Pulses:   2+ and symmetric all extremities  ?Skin:   Skin color, texture, turgor normal, no rashes or lesions ?  ? The indications for endometrial biopsy were reviewed.   Risks of the biopsy including cramping, bleeding, infection, uterine perforation, inadequate specimen and need for additional procedures  were discussed. The patient states she understands and agrees to undergo procedure today. Consent was signed. Time out was performed. Urine HCG was negative. ?A sterile speculum was placed in the patient's vagina and the cervix was prepped with Betadine. A single-toothed tenaculum was placed on the anterior lip of the cervix to stabilize it. The 3 mm pipelle was introduced into the endometrial cavity without difficulty to a depth of 8cm, and a moderate amount of  tissue was obtained and sent to pathology. The instruments were removed from the patient's vagina. Minimal bleeding from the cervix was noted. The patient tolerated the procedure well. Routine post-procedure instructions were given to the patient. The patient will follow up to review the results and for further management.    ? ?Assessment:  ? ? Healthy female exam.  ?  ?Plan:  ? ?Leah Hampton was seen today for gynecologic exam. ? ?Diagnoses and all orders for this visit: ? ?Well female exam with routine gynecological exam ?-     Cytology - PAP( [facility]) ? ?Postmenopausal bleeding ?-     US PELVIC COMPLETE WITH TRANSVAGINAL; Future ?-     Surgical pathology( [place]/ [facility]) ?-     Surgical pathology ? ? Will  f/u with results of bx ? ? Leah Hampton, M.D., [place] ? ?

## 2021-09-04 ENCOUNTER — Ambulatory Visit (HOSPITAL_BASED_OUTPATIENT_CLINIC_OR_DEPARTMENT_OTHER): Payer: Medicare Other

## 2021-09-04 LAB — SURGICAL PATHOLOGY

## 2021-09-07 LAB — CYTOLOGY - PAP
Comment: NEGATIVE
Diagnosis: NEGATIVE
High risk HPV: NEGATIVE

## 2021-09-09 ENCOUNTER — Ambulatory Visit (HOSPITAL_BASED_OUTPATIENT_CLINIC_OR_DEPARTMENT_OTHER)
Admission: RE | Admit: 2021-09-09 | Discharge: 2021-09-09 | Disposition: A | Discharge status: Home / Self Care | Payer: Medicare Other | Source: Ambulatory Visit | Attending: Obstetrics & Gynecology | Admitting: Obstetrics & Gynecology

## 2021-09-09 DIAGNOSIS — N950 Postmenopausal bleeding: Secondary | ICD-10-CM | POA: Insufficient documentation

## 2021-09-14 ENCOUNTER — Telehealth: Payer: Self-pay

## 2021-09-14 ENCOUNTER — Other Ambulatory Visit: Payer: Self-pay | Admitting: Family

## 2021-09-14 NOTE — Telephone Encounter (Signed)
-----   Message from Lavonia Drafts, MD sent at 09/14/2021  8:53 AM EDT ----- ?Please call pt. Her bx was WNL. Please stress to her the importance of following up if he bleeding returns.  ? ?Thanks,  ? ?Clh-S ?

## 2021-09-14 NOTE — Telephone Encounter (Signed)
Called pt in regards to Endo bx results. Patient made aware that her Endo Bx was WNL. Understanding was voiced. ?Shonn Farruggia l Lacerte, CMA  ?

## 2021-09-18 ENCOUNTER — Ambulatory Visit (INDEPENDENT_AMBULATORY_CARE_PROVIDER_SITE_OTHER): Payer: Medicare Other | Admitting: Podiatry

## 2021-09-18 DIAGNOSIS — L989 Disorder of the skin and subcutaneous tissue, unspecified: Secondary | ICD-10-CM

## 2021-09-18 DIAGNOSIS — B353 Tinea pedis: Secondary | ICD-10-CM | POA: Diagnosis not present

## 2021-09-21 ENCOUNTER — Encounter: Payer: Medicare Other | Attending: General Surgery | Admitting: Skilled Nursing Facility1

## 2021-09-21 NOTE — Progress Notes (Signed)
Subjective: ?66 year old female presents the office today for evaluation of skin rash on bilateral feet.  She is been using triamcinolone cream but what her last appointment she states has been doing much better.  She said the procedure site is also healed did not have any issues.  No swelling redness or drainage. ? ?Objective: ?AAO x3, NAD ?DP/PT pulses palpable bilaterally, CRT less than 3 seconds ?Rash appears to be mostly resolved.  See pictures below.  The procedure site is also well-healed and the small scab is present.  Nails are mildly elongated, hypertrophic, dystrophic with yellow discoloration but not causing significant pain. ?No pain with calf compression, swelling, warmth, erythema ? ? ? ? ? ? ?Assessment: ?Skin rash much improvement, status post biopsy ? ?Plan: ?-All treatment options discussed with the patient including all alternatives, risks, complications.  ?-Waiting the results of the biopsy still.  I will call follow-up on this. ?-Rash appears to be much improved.  Continue steroid cream until completely resolved ?-As a courtesy debride the nails x10 without any complications or bleeding ?-Patient encouraged to call the office with any questions, concerns, change in symptoms.  ? ?Trula Slade DPM ? ? ?

## 2021-09-24 ENCOUNTER — Telehealth: Payer: Self-pay | Admitting: *Deleted

## 2021-09-24 NOTE — Telephone Encounter (Signed)
-----   Message from Caron Presume, LPN sent at 2/76/1848  4:44 PM EDT ----- ?[facility] is faxing over the Results  ?----- Message ----- ?From: Trula Slade, DPM ?Sent: 09/21/2021   6:55 PM EDT ?To: Romelle Muldoon L Arlethia Basso, RMA, Caron Presume, LPN ? ?Can someone please call [facility] to see if we get the results of the pathology back?  Thank you. ? ? ?

## 2021-09-24 NOTE — Telephone Encounter (Signed)
[facility] and the report is ready, are faxing to our office.Received and  ?placed in your folder, scanned into epic 09/24/21. ?

## 2021-09-25 ENCOUNTER — Encounter: Payer: Self-pay | Admitting: Podiatry

## 2021-10-07 DIAGNOSIS — K439 Ventral hernia without obstruction or gangrene: Secondary | ICD-10-CM | POA: Diagnosis not present

## 2021-10-07 DIAGNOSIS — M1711 Unilateral primary osteoarthritis, right knee: Secondary | ICD-10-CM | POA: Diagnosis not present

## 2021-10-07 DIAGNOSIS — Z9884 Bariatric surgery status: Secondary | ICD-10-CM | POA: Diagnosis not present

## 2021-10-07 DIAGNOSIS — I10 Essential (primary) hypertension: Secondary | ICD-10-CM | POA: Diagnosis not present

## 2021-10-12 ENCOUNTER — Other Ambulatory Visit: Payer: Self-pay | Admitting: Family

## 2021-10-14 ENCOUNTER — Ambulatory Visit: Payer: Medicare Other | Admitting: Obstetrics & Gynecology

## 2021-10-15 ENCOUNTER — Encounter: Payer: Medicare Other | Attending: General Surgery | Admitting: Skilled Nursing Facility1

## 2021-10-15 ENCOUNTER — Encounter: Payer: Self-pay | Admitting: Skilled Nursing Facility1

## 2021-10-15 DIAGNOSIS — E669 Obesity, unspecified: Secondary | ICD-10-CM | POA: Insufficient documentation

## 2021-10-15 DIAGNOSIS — E119 Type 2 diabetes mellitus without complications: Secondary | ICD-10-CM | POA: Diagnosis not present
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
## 2021-11-10 ENCOUNTER — Other Ambulatory Visit (HOSPITAL_COMMUNITY): Payer: Self-pay

## 2021-11-20 NOTE — Progress Notes (Signed)
Subjective:   Leah Hampton is a 66 y.o. female who presents for Medicare Annual (Subsequent) preventive examination.  Review of Systems     I connected with  Leah Hampton on 11/23/21 by a audio enabled telemedicine application and verified that I am speaking with the correct person using two identifiers.  Patient Location: Home  Provider Location: Office/Clinic  I discussed the limitations of evaluation and management by telemedicine. The patient expressed understanding and agreed to proceed.   Cardiac Risk Factors include: obesity (BMI >30kg/m2);advanced age (>3men, >18 women);hypertension;dyslipidemia;diabetes mellitus     Objective:    Today's Vitals   11/23/21 1304  PainSc: 8    There is no height or weight on file to calculate BMI.     11/23/2021    1:03 PM 11/18/2020    9:44 AM 10/07/2020   10:59 AM 10/02/2020    9:00 AM 08/18/2020    5:20 PM 07/15/2020    9:31 AM 02/20/2020   10:06 AM  Advanced Directives  Does Patient Have a Medical Advance Directive? No No No No No No No  Would patient like information on creating a medical advance directive? No - Patient declined No - Patient declined  Yes (MAU/Ambulatory/Procedural Areas - Information given) No - Patient declined No - Patient declined Yes (MAU/Ambulatory/Procedural Areas - Information given)    Current Medications (verified) Outpatient Encounter Medications as of 11/23/2021  Medication Sig   albuterol (VENTOLIN HFA) 108 (90 Base) MCG/ACT inhaler USE 2 INHALATIONS BY MOUTH EVERY 6 HOURS AS NEEDED FOR WHEEZING  OR SHORTNESS OF BREATH   amLODipine (NORVASC) 5 MG tablet TAKE 1 TABLET BY MOUTH DAILY   COVID-19 mRNA bivalent vaccine, Pfizer, (PFIZER COVID-19 VAC BIVALENT) injection Inject into the muscle.   desoximetasone (TOPICORT) 0.25 % cream Apply 1 application. topically 2 (two) times daily.   diclofenac Sodium (VOLTAREN) 1 % GEL Apply 2 g topically 4 (four) times daily.   FLOVENT HFA 110 MCG/ACT inhaler USE  2 INHALATIONS BY MOUTH  TWICE DAILY   losartan (COZAAR) 100 MG tablet TAKE 1 TABLET BY MOUTH  DAILY   Multiple Vitamin (MULTIVITAMIN WITH MINERALS) TABS tablet Take 1 tablet by mouth daily. Vitafusion   omeprazole (PRILOSEC) 20 MG capsule TAKE 1 CAPSULE BY MOUTH  DAILY   polyethylene glycol (MIRALAX / GLYCOLAX) 17 g packet Take 17 g by mouth daily as needed for moderate constipation or severe constipation.   pravastatin (PRAVACHOL) 80 MG tablet TAKE 1 TABLET BY MOUTH DAILY   psyllium (METAMUCIL) 58.6 % powder Take 1 packet by mouth daily as needed (constipation).   valACYclovir (VALTREX) 1000 MG tablet TAKE 1 TABLET BY MOUTH DAILY   No facility-administered encounter medications on file as of 11/23/2021.    Allergies (verified) Sulfamethoxazole-trimethoprim, Sulfamethoxazole-trimethoprim [bactrim], and Codeine   History: Past Medical History:  Diagnosis Date   Bronchitis    Chronic bronchitis (HCC)    Cystitis    Diabetes type 2, controlled (HCC)    Hematuria    urologice eval, Dr Lindley Magnus   History of chest pain    Hyperbilirubinemia    Hyperlipemia    Hypertelorism    Hypertension    Leukocytosis    Obesity    Osteoarthritis    right knee   Sleep apnea 2012   slight    Vaginal Pap smear, abnormal    Vitamin D deficiency 02/09/2018   Past Surgical History:  Procedure Laterality Date   CERVICAL POLYPECTOMY  07/15/2020   Procedure: POLYPECTOMY  with Myosure;  Surgeon: Willodean Rosenthal, MD;  Location: [facility] OR;  Service: Gynecology;;   COLONOSCOPY WITH PROPOFOL N/A 03/27/2015   Procedure: COLONOSCOPY WITH PROPOFOL;  Surgeon: Rachael Fee, MD;  Location: [facility];  Service: Endoscopy;  Laterality: N/A;   COLONOSCOPY WITH PROPOFOL N/A 10/02/2020   Procedure: COLONOSCOPY WITH PROPOFOL;  Surgeon: Rachael Fee, MD;  Location: [facility];  Service: Endoscopy;  Laterality: N/A;   COLPOSCOPY N/A 07/15/2020   Procedure: COLPOSCOPY;  Surgeon: Willodean Rosenthal, MD;   Location: [facility] OR;  Service: Gynecology;  Laterality: N/A;   DILATATION & CURETTAGE/HYSTEROSCOPY WITH MYOSURE N/A 01/05/2017   Procedure: DILATATION & CURETTAGE/HYSTEROSCOPY WITH MYOSURE;  Surgeon: Geryl Rankins, MD;  Location: [facility] ORS;  Service: Gynecology;  Laterality: N/A;  PostMenopausal Bleeding   HYSTEROSCOPY WITH D & C N/A 05/22/2019   Procedure: DILATATION AND CURETTAGE /HYSTEROSCOPY, POLYPECTOMY WITH MYOSURE;  Surgeon: Willodean Rosenthal, MD;  Location: [facility] OR[facility];  Service: Gynecology;  Laterality: N/A;   HYSTEROSCOPY WITH D & C N/A 07/15/2020   Procedure: DILATATION AND CURETTAGE /HYSTEROSCOPY;  Surgeon: Willodean Rosenthal, MD;  Location: [facility] OR;  Service: Gynecology;  Laterality: N/A;   KNEE ARTHROSCOPY  04/2007   left knee   TOTAL KNEE ARTHROPLASTY Left 10/2008   Dr Irine Seal   UPPER GI ENDOSCOPY N/A 10/14/2020   Procedure: UPPER GI ENDOSCOPY;  Surgeon: Gaynelle Adu, MD;  Location: [facility];  Service: General;  Laterality: N/A;   Family History  Problem Relation Age of Onset   Heart failure Mother    Arthritis Mother    Hypertension Mother    Stroke Mother    Diabetes Mellitus II Mother    Heart attack Father 10   Diabetes Other    Arthritis Other    Stroke Other    Coronary artery disease Other    Hypertension Sister        x 2   CVA Sister    CAD Sister 96   Diabetes Mellitus II Sister    Cirrhosis Brother    Alcohol abuse Brother    Colon cancer Neg Hx    Social History   Socioeconomic History   Marital status: Single    Spouse name: Not on file   Number of children: 2   Years of education: Not on file   Highest education level: Not on file  Occupational History   Occupation: disabled  Tobacco Use   Smoking status: Never   Smokeless tobacco: Never  Vaping Use   Vaping Use: Never used  Substance and Sexual Activity   Alcohol use: No   Drug use: No   Sexual activity: Not Currently  Other Topics Concern   Not on file  Social History Narrative   Regular  exercise:  No   Lives alone   Has 2 grown sons, both local.  1 grandson   On disability due to her knee pain   Completed 12th grade   Enjoys television   No pets.    Social Determinants of Health   Financial Resource Strain: Low Risk  (11/18/2020)   Overall Financial Resource Strain (CARDIA)    Difficulty of Paying Living Expenses: Not hard at all  Food Insecurity: No Food Insecurity (02/20/2020)   Hunger Vital Sign    Worried About Running Out of Food in the Last Year: Never true    Ran Out of Food in the Last Year: Never true  Transportation Needs: No Transportation Needs (11/18/2020)   PRAPARE - Transportation  Lack of Transportation (Medical): No    Lack of Transportation (Non-Medical): No  Physical Activity: Insufficiently Active (11/18/2020)   Exercise Vital Sign    Days of Exercise per Week: 1 day    Minutes of Exercise per Session: 20 min  Stress: No Stress Concern Present (11/18/2020)   [employer] of Occupational Health - Occupational Stress Questionnaire    Feeling of Stress : Not at all  Social Connections: Moderately Isolated (11/18/2020)   Social Connection and Isolation Panel [NHANES]    Frequency of Communication with Friends and Family: More than three times a week    Frequency of Social Gatherings with Friends and Family: Once a week    Attends Religious Services: 1 to 4 times per year    Active Member of [employer] or Organizations: No    Attends Engineer, structural: Never    Marital Status: Never married    Tobacco Counseling Counseling given: Not Answered   Clinical Intake:  Pre-visit preparation completed: Yes  Pain : 0-10 Pain Score: 8  Pain Location: Knee Pain Orientation: Right Pain Descriptors / Indicators: Aching, Dull, Sore Pain Onset: More than a month ago Pain Frequency: Constant     BMI - recorded: 44.81 Nutritional Status: BMI > 30  Obese Nutritional Risks: None Diabetes: Yes CBG done?: No Did pt. bring in CBG monitor from  home?: No  How often do you need to have someone help you when you read instructions, pamphlets, or other written materials from your doctor or pharmacy?: 1 - Never  Diabetic?yes Nutrition Risk Assessment:  Has the patient had any N/V/D within the last 2 months?  No  Does the patient have any non-healing wounds?  No  Has the patient had any unintentional weight loss or weight gain?  No   Diabetes:  Is the patient diabetic?  Yes  If diabetic, was a CBG obtained today?  No  Did the patient bring in their glucometer from home?  No  How often do you monitor your CBG's? N/A.   Financial Strains and Diabetes Management:  Are you having any financial strains with the device, your supplies or your medication? No .  Does the patient want to be seen by Chronic Care Management for management of their diabetes?  No  Would the patient like to be referred to a Nutritionist or for Diabetic Management?  No   Diabetic Exams:  Diabetic Eye Exam: Completed 06/04/21 Diabetic Foot Exam: Overdue, Pt has been advised about the importance in completing this exam. Pt is scheduled for diabetic foot exam on N/A.   Interpreter Needed?: No  Information entered by :: Deral Schellenberg   Activities of Daily Living    11/23/2021    1:07 PM  In your present state of health, do you have any difficulty performing the following activities:  Hearing? 0  Vision? 0  Difficulty concentrating or making decisions? 0  Walking or climbing stairs? 1  Dressing or bathing? 0  Doing errands, shopping? 0  Preparing Food and eating ? N  Using the Toilet? N  In the past six months, have you accidently leaked urine? N  Do you have problems with loss of bowel control? N  Managing your Medications? N  Managing your Finances? N  Housekeeping or managing your Housekeeping? N    Patient Care Team: Sandford Craze, NP as PCP - General (Internal Medicine) Sandford Craze, NP as Nurse Practitioner (Internal  Medicine)  Indicate any recent Medical Services you may have received  from other than [facility] providers in the past year (date may be approximate).     Assessment:   This is a routine wellness examination for Leah Hampton.  Hearing/Vision screen No results found.  Dietary issues and exercise activities discussed: Current Exercise Habits: Home exercise routine, Type of exercise: walking, Time (Minutes): 40, Frequency (Times/Week): 5, Weekly Exercise (Minutes/Week): 200, Intensity: Mild   Goals Addressed             This Visit's Progress    Increase physical activity   On track      Depression Screen    11/23/2021    1:03 PM 11/18/2020    9:47 AM 11/04/2020   11:21 AM 09/15/2020    8:26 AM 02/20/2020   10:07 AM 07/24/2019   10:56 AM 04/07/2018   10:44 AM  PHQ 2/9 Scores  PHQ - 2 Score 0 0 1 0 0 0   Exception Documentation       Patient refusal    Fall Risk    11/23/2021    1:03 PM 11/18/2020    9:46 AM 11/04/2020   11:21 AM 02/20/2020   10:07 AM 07/24/2019   10:56 AM  Fall Risk   Falls in the past year? 0 1 1 0 0  Number falls in past yr: 0 0 0  0  Injury with Fall? 0 0 1  0  Risk for fall due to : No Fall Risks History of fall(s)     Follow up Falls evaluation completed Falls prevention discussed   Education provided;Falls prevention discussed    FALL RISK PREVENTION PERTAINING TO THE HOME:  Any stairs in or around the home? Yes  If so, are there any without handrails? No  Home free of loose throw rugs in walkways, pet beds, electrical cords, etc? Yes  Adequate lighting in your home to reduce risk of falls? Yes   ASSISTIVE DEVICES UTILIZED TO PREVENT FALLS:  Life alert? No  Use of a cane, walker or w/c? Yes  Grab bars in the bathroom? Yes  Shower chair or bench in shower? Yes  Elevated toilet seat or a handicapped toilet? No   TIMED UP AND GO:  Was the test performed? No .    Cognitive Function:        11/23/2021    1:11 PM  6CIT Screen  What Year? 0 points   What month? 0 points  What time? 0 points  Count back from 20 0 points  Repeat phrase 4 points    Immunizations Immunization History  Administered Date(s) Administered   Fluad Quad(high Dose 65+) 02/09/2019   Influenza Whole 02/24/2010   Influenza,inj,Quad PF,6+ Mos 05/01/2014, 03/22/2016, 02/16/2017, 01/04/2018, 01/23/2019, 03/14/2020, 04/06/2021   PFIZER Comirnaty(Gray Top)Covid-19 Tri-Sucrose Vaccine 11/04/2020   PFIZER(Purple Top)SARS-COV-2 Vaccination 08/17/2019, 09/12/2019, 03/14/2020   PNEUMOCOCCAL CONJUGATE-20 11/04/2020   Pfizer Covid-19 Vaccine Bivalent Booster 35yrs & up 04/06/2021   Pneumococcal Polysaccharide-23 06/13/2014, 07/27/2019   Td 07/03/2008    TDAP status: Due, Education has been provided regarding the importance of this vaccine. Advised may receive this vaccine at local pharmacy or Health Dept. Aware to provide a copy of the vaccination record if obtained from local pharmacy or Health Dept. Verbalized acceptance and understanding.  Flu Vaccine status: Up to date  Pneumococcal vaccine status: Up to date  Covid-19 vaccine status: Completed vaccines  Qualifies for Shingles Vaccine? Yes   Zostavax completed No   Shingrix Completed?: No.    Education has been provided regarding the importance  of this vaccine. Patient has been advised to call insurance company to determine out of pocket expense if they have not yet received this vaccine. Advised may also receive vaccine at local pharmacy or Health Dept. Verbalized acceptance and understanding.  Screening Tests Health Maintenance  Topic Date Due   Zoster Vaccines- Shingrix (1 of 2) Never done   TETANUS/TDAP  07/03/2018   FOOT EXAM  11/04/2021   INFLUENZA VACCINE  12/15/2021   HEMOGLOBIN A1C  02/04/2022   OPHTHALMOLOGY EXAM  06/04/2022   MAMMOGRAM  12/26/2022   PAP SMEAR-Modifier  09/02/2024   COLONOSCOPY (Pts 45-37yrs Insurance coverage will need to be confirmed)  10/03/2030   COVID-19 Vaccine  Completed    Hepatitis C Screening  Completed   HIV Screening  Completed   HPV VACCINES  Aged Out    Health Maintenance  Health Maintenance Due  Topic Date Due   Zoster Vaccines- Shingrix (1 of 2) Never done   TETANUS/TDAP  07/03/2018   FOOT EXAM  11/04/2021    Colorectal cancer screening: Type of screening: Colonoscopy. Completed 10/02/20. Repeat every 10 years  Mammogram status: Completed 12/25/20. Repeat every year  Bone Density: not yet due  Lung Cancer Screening: (Low Dose CT Chest recommended if Age 67-80 years, 30 pack-year currently smoking OR have quit w/in 15years.) does not qualify.   Lung Cancer Screening Referral: n/a  Additional Screening:  Hepatitis C Screening: does qualify; Completed 04/14/15  Vision Screening: Recommended annual ophthalmology exams for early detection of glaucoma and other disorders of the eye. Is the patient up to date with their annual eye exam?  Yes  Who is the provider or what is the name of the office in which the patient attends annual eye exams? Dr. Dione Booze  If pt is not established with a provider, would they like to be referred to a provider to establish care? No .   Dental Screening: Recommended annual dental exams for proper oral hygiene  Community Resource Referral / Chronic Care Management: CRR required this visit?  No   CCM required this visit?  No      Plan:     I have personally reviewed and noted the following in the patient's chart:   Medical and social history Use of alcohol, tobacco or illicit drugs  Current medications and supplements including opioid prescriptions.  Functional ability and status Nutritional status Physical activity Advanced directives List of other physicians Hospitalizations, surgeries, and ER visits in previous 12 months Vitals Screenings to include cognitive, depression, and falls Referrals and appointments  In addition, I have reviewed and discussed with patient certain preventive protocols,  quality metrics, and best practice recommendations. A written personalized care plan for preventive services as well as general preventive health recommendations were provided to patient.   Due to this being a telephonic visit, the after visit summary with patients personalized plan was offered to patient via mail or my-chart.  Patient would like to access on my-chart.   Leah Hampton, CMA   11/23/2021   Nurse Notes:

## 2021-11-23 ENCOUNTER — Ambulatory Visit (INDEPENDENT_AMBULATORY_CARE_PROVIDER_SITE_OTHER): Payer: Medicare Other

## 2021-11-23 ENCOUNTER — Other Ambulatory Visit: Payer: Self-pay | Admitting: Family

## 2021-11-23 DIAGNOSIS — Z1231 Encounter for screening mammogram for malignant neoplasm of breast: Secondary | ICD-10-CM

## 2021-11-23 DIAGNOSIS — Z0000 Encounter for general adult medical examination without abnormal findings: Secondary | ICD-10-CM | POA: Diagnosis not present

## 2021-11-23 NOTE — Patient Instructions (Signed)
Leah Hampton , Thank you for taking time to come for your Medicare Wellness Visit. I appreciate your ongoing commitment to your health goals. Please review the following plan we discussed and let me know if I can assist you in the future.   Screening recommendations/referrals: Colonoscopy: 10/02/20 due 10/03/30 Mammogram: 12/25/20 due 12/25/21 Bone Density: n/a Recommended yearly ophthalmology/optometry visit for glaucoma screening and checkup Recommended yearly dental visit for hygiene and checkup  Vaccinations: Influenza vaccine: up to date Pneumococcal vaccine: up to date Tdap vaccine: Due-May obtain vaccine at our your local pharmacy.  Shingles vaccine: up to date  Covid-19: Due-May obtain vaccine at your local pharmacy.   Advanced directives: no  Conditions/risks identified: see problem list   Next appointment: Follow up in one year for your annual wellness visit.   Preventive Care 40-64 Years, Female Preventive care refers to lifestyle choices and visits with your health care provider that can promote health and wellness. What does preventive care include? A yearly physical exam. This is also called an annual well check. Dental exams once or twice a year. Routine eye exams. Ask your health care provider how often you should have your eyes checked. Personal lifestyle choices, including: Daily care of your teeth and gums. Regular physical activity. Eating a healthy diet. Avoiding tobacco and drug use. Limiting alcohol use. Practicing safe sex. Taking low-dose aspirin daily starting at age 88. Taking vitamin and mineral supplements as recommended by your health care provider. What happens during an annual well check? The services and screenings done by your health care provider during your annual well check will depend on your age, overall health, lifestyle risk factors, and family history of disease. Counseling  Your health care provider may ask you questions about  your: Alcohol use. Tobacco use. Drug use. Emotional well-being. Home and relationship well-being. Sexual activity. Eating habits. Work and work Statistician. Method of birth control. Menstrual cycle. Pregnancy history. Screening  You may have the following tests or measurements: Height, weight, and BMI. Blood pressure. Lipid and cholesterol levels. These may be checked every 5 years, or more frequently if you are over 28 years old. Skin check. Lung cancer screening. You may have this screening every year starting at age 52 if you have a 30-pack-year history of smoking and currently smoke or have quit within the past 15 years. Fecal occult blood test (FOBT) of the stool. You may have this test every year starting at age 47. Flexible sigmoidoscopy or colonoscopy. You may have a sigmoidoscopy every 5 years or a colonoscopy every 10 years starting at age 3. Hepatitis C blood test. Hepatitis B blood test. Sexually transmitted disease (STD) testing. Diabetes screening. This is done by checking your blood sugar (glucose) after you have not eaten for a while (fasting). You may have this done every 1-3 years. Mammogram. This may be done every 1-2 years. Talk to your health care provider about when you should start having regular mammograms. This may depend on whether you have a family history of breast cancer. BRCA-related cancer screening. This may be done if you have a family history of breast, ovarian, tubal, or peritoneal cancers. Pelvic exam and Pap test. This may be done every 3 years starting at age 56. Starting at age 74, this may be done every 5 years if you have a Pap test in combination with an HPV test. Bone density scan. This is done to screen for osteoporosis. You may have this scan if you are at high risk for  osteoporosis. Discuss your test results, treatment options, and if necessary, the need for more tests with your health care provider. Vaccines  Your health care provider may  recommend certain vaccines, such as: Influenza vaccine. This is recommended every year. Tetanus, diphtheria, and acellular pertussis (Tdap, Td) vaccine. You may need a Td booster every 10 years. Zoster vaccine. You may need this after age 46. Pneumococcal 13-valent conjugate (PCV13) vaccine. You may need this if you have certain conditions and were not previously vaccinated. Pneumococcal polysaccharide (PPSV23) vaccine. You may need one or two doses if you smoke cigarettes or if you have certain conditions. Talk to your health care provider about which screenings and vaccines you need and how often you need them. This information is not intended to replace advice given to you by your health care provider. Make sure you discuss any questions you have with your health care provider. Document Released: 05/30/2015 Document Revised: 01/21/2016 Document Reviewed: 03/04/2015 Elsevier Interactive Patient Education  2017 [employer] Prevention in the Home Falls can cause injuries. They can happen to people of all ages. There are many things you can do to make your home safe and to help prevent falls. What can I do on the outside of my home? Regularly fix the edges of walkways and driveways and fix any cracks. Remove anything that might make you trip as you walk through a door, such as a raised step or threshold. Trim any bushes or trees on the path to your home. Use bright outdoor lighting. Clear any walking paths of anything that might make someone trip, such as rocks or tools. Regularly check to see if handrails are loose or broken. Make sure that both sides of any steps have handrails. Any raised decks and porches should have guardrails on the edges. Have any leaves, snow, or ice cleared regularly. Use sand or salt on walking paths during winter. Clean up any spills in your garage right away. This includes oil or grease spills. What can I do in the bathroom? Use night lights. Install  grab bars by the toilet and in the tub and shower. Do not use towel bars as grab bars. Use non-skid mats or decals in the tub or shower. If you need to sit down in the shower, use a plastic, non-slip stool. Keep the floor dry. Clean up any water that spills on the floor as soon as it happens. Remove soap buildup in the tub or shower regularly. Attach bath mats securely with double-sided non-slip rug tape. Do not have throw rugs and other things on the floor that can make you trip. What can I do in the bedroom? Use night lights. Make sure that you have a light by your bed that is easy to reach. Do not use any sheets or blankets that are too big for your bed. They should not hang down onto the floor. Have a firm chair that has side arms. You can use this for support while you get dressed. Do not have throw rugs and other things on the floor that can make you trip. What can I do in the kitchen? Clean up any spills right away. Avoid walking on wet floors. Keep items that you use a lot in easy-to-reach places. If you need to reach something above you, use a strong step stool that has a grab bar. Keep electrical cords out of the way. Do not use floor polish or wax that makes floors slippery. If you must use  wax, use non-skid floor wax. Do not have throw rugs and other things on the floor that can make you trip. What can I do with my stairs? Do not leave any items on the stairs. Make sure that there are handrails on both sides of the stairs and use them. Fix handrails that are broken or loose. Make sure that handrails are as long as the stairways. Check any carpeting to make sure that it is firmly attached to the stairs. Fix any carpet that is loose or worn. Avoid having throw rugs at the top or bottom of the stairs. If you do have throw rugs, attach them to the floor with carpet tape. Make sure that you have a light switch at the top of the stairs and the bottom of the stairs. If you do not have  them, ask someone to add them for you. What else can I do to help prevent falls? Wear shoes that: Do not have high heels. Have rubber bottoms. Are comfortable and fit you well. Are closed at the toe. Do not wear sandals. If you use a stepladder: Make sure that it is fully opened. Do not climb a closed stepladder. Make sure that both sides of the stepladder are locked into place. Ask someone to hold it for you, if possible. Clearly mark and make sure that you can see: Any grab bars or handrails. First and last steps. Where the edge of each step is. Use tools that help you move around (mobility aids) if they are needed. These include: Canes. Walkers. Scooters. Crutches. Turn on the lights when you go into a dark area. Replace any light bulbs as soon as they burn out. Set up your furniture so you have a clear path. Avoid moving your furniture around. If any of your floors are uneven, fix them. If there are any pets around you, be aware of where they are. Review your medicines with your doctor. Some medicines can make you feel dizzy. This can increase your chance of falling. Ask your doctor what other things that you can do to help prevent falls. This information is not intended to replace advice given to you by your health care provider. Make sure you discuss any questions you have with your health care provider. Document Released: 02/27/2009 Document Revised: 10/09/2015 Document Reviewed: 06/07/2014 Elsevier Interactive Patient Education  2017 [employer].

## 2021-11-24 ENCOUNTER — Ambulatory Visit: Payer: Medicare Other

## 2021-11-28 ENCOUNTER — Other Ambulatory Visit: Payer: Self-pay | Admitting: Family

## 2021-12-04 ENCOUNTER — Ambulatory Visit (INDEPENDENT_AMBULATORY_CARE_PROVIDER_SITE_OTHER): Payer: Medicare Other | Admitting: Family

## 2021-12-04 ENCOUNTER — Encounter: Payer: Self-pay | Admitting: Family

## 2021-12-04 VITALS — BP 120/55 | HR 70 | Temp 98.4°F | Resp 16 | Wt 244.0 lb

## 2021-12-04 DIAGNOSIS — E119 Type 2 diabetes mellitus without complications: Secondary | ICD-10-CM

## 2021-12-04 DIAGNOSIS — Z9884 Bariatric surgery status: Secondary | ICD-10-CM

## 2021-12-04 DIAGNOSIS — J45909 Unspecified asthma, uncomplicated: Secondary | ICD-10-CM

## 2021-12-04 DIAGNOSIS — E782 Mixed hyperlipidemia: Secondary | ICD-10-CM

## 2021-12-04 DIAGNOSIS — E559 Vitamin D deficiency, unspecified: Secondary | ICD-10-CM

## 2021-12-04 DIAGNOSIS — G4733 Obstructive sleep apnea (adult) (pediatric): Secondary | ICD-10-CM | POA: Diagnosis not present

## 2021-12-04 DIAGNOSIS — I10 Essential (primary) hypertension: Secondary | ICD-10-CM

## 2021-12-04 DIAGNOSIS — A6000 Herpesviral infection of urogenital system, unspecified: Secondary | ICD-10-CM

## 2021-12-04 LAB — COMPREHENSIVE METABOLIC PANEL
ALT: 22 U/L (ref 0–35)
AST: 23 U/L (ref 0–37)
Albumin: 4.2 g/dL (ref 3.5–5.2)
Alkaline Phosphatase: 81 U/L (ref 39–117)
BUN: 16 mg/dL (ref 6–23)
CO2: 31 mEq/L (ref 19–32)
Calcium: 9.6 mg/dL (ref 8.4–10.5)
Chloride: 105 mEq/L (ref 96–112)
Creatinine, Ser: 0.74 mg/dL (ref 0.40–1.20)
GFR: 85.65 mL/min (ref >60.00)
Glucose, Bld: 90 mg/dL (ref 70–99)
Potassium: 4 mEq/L (ref 3.5–5.1)
Sodium: 144 mEq/L (ref 135–145)
Total Bilirubin: 0.4 mg/dL (ref 0.2–1.2)
Total Protein: 7.1 g/dL (ref 6.0–8.3)

## 2021-12-04 LAB — CBC WITH DIFFERENTIAL/PLATELET
Basophils Absolute: 0.1 10*3/uL (ref 0.0–0.1)
Basophils Relative: 1.3 % (ref 0.0–3.0)
Eosinophils Absolute: 0.1 10*3/uL (ref 0.0–0.7)
Eosinophils Relative: 1.8 % (ref 0.0–5.0)
HCT: 38.3 % (ref 36.0–46.0)
Hemoglobin: 12.5 g/dL (ref 12.0–15.0)
Lymphocytes Relative: 43.3 % (ref 12.0–46.0)
Lymphs Abs: 3.5 10*3/uL (ref 0.7–4.0)
MCHC: 32.5 g/dL (ref 30.0–36.0)
MCV: 93 fl (ref 78.0–100.0)
Monocytes Absolute: 0.6 10*3/uL (ref 0.1–1.0)
Monocytes Relative: 6.9 % (ref 3.0–12.0)
Neutro Abs: 3.7 10*3/uL (ref 1.4–7.7)
Neutrophils Relative %: 46.7 % (ref 43.0–77.0)
Platelets: 258 10*3/uL (ref 150.0–400.0)
RBC: 4.12 Mil/uL (ref 3.87–5.11)
RDW: 16.1 % — ABNORMAL HIGH (ref 11.5–15.5)
WBC: 8 10*3/uL (ref 4.0–10.5)

## 2021-12-04 LAB — HEMOGLOBIN A1C: Hgb A1c MFr Bld: 6.1 % (ref 4.6–6.5)

## 2021-12-04 LAB — VITAMIN B12: Vitamin B-12: >1504 pg/mL — ABNORMAL HIGH (ref 211–911)

## 2021-12-04 LAB — FOLATE: Folate: >24.2 ng/mL (ref >5.9)

## 2021-12-04 LAB — VITAMIN D 25 HYDROXY (VIT D DEFICIENCY, FRACTURES): VITD: 62.42 ng/mL (ref 30.00–100.00)

## 2021-12-04 MED ORDER — FLUTICASONE PROPIONATE HFA 110 MCG/ACT IN AERO: INHALATION_SPRAY | RESPIRATORY_TRACT | 3 refills | Status: DC

## 2021-12-04 NOTE — Assessment & Plan Note (Addendum)
BP Readings from Last 3 Encounters:  12/04/21 (!) 120/55  09/02/21 (!) 120/59  09/02/21 (!) 120/59    At goal, continue amlodipine and losartan  Continue same.

## 2021-12-04 NOTE — Progress Notes (Addendum)
Subjective:   By signing my name below, I, [name], attest that this documentation has been prepared under the direction and in the presence of [name], NP 12/04/2021   Patient ID: Leah Hampton, female    DOB: November 20, 1955, 66 y.o.   MRN: [id number]  Chief Complaint  Patient presents with   Hypertension    Here for follow up   Diabetes    Here for follow up    HPI Patient is in today for an office visit.  Refill - She is requesting a refill of 110 MCG/ACT Fluticasone.  Blood Work - She is requesting to get blood work done during today's visit. Loss of Pigmentation - She complains of hypopigmentation behind her right knee.  Right Knee - She is still having troubles with her right knee. She is scheduled for an orthopedics appointment on 12/10/2021. She uses Voltaren gel on her knee and occasionally uses ice.  Surgical History - She reports having a gastric sleeve surgery in 2022.  Cholesterol - As of today's visit, her cholesterol levels are normal. She is currently taking 80 Mg of Pravastatin.  Lab Results  Component Value Date   CHOL 169 08/04/2021   HDL 57.50 08/04/2021   LDLCALC 97 08/04/2021   TRIG 72.0 08/04/2021   CHOLHDL 3 08/04/2021   Gynecology - She is regularly following up with her gynecologist.   Weight - Before her surgery, she was about 315 lbs. She was previously working out regularly but due to her knee, it she has troubles walking on the treadmill Wt Readings from Last 3 Encounters:  12/04/21 244 lb (110.7 kg)  10/15/21 241 lb 9.6 oz (109.6 kg)  09/02/21 242 lb (109.8 kg)   Blood Pressure - Her blood pressure levels are normal. She is currently taking 5 Mg of Amlodipine and 100 Mg of Losartan.  BP Readings from Last 3 Encounters:  12/04/21 (!) 120/55  09/02/21 (!) 120/59  09/02/21 (!) 120/59   Pulse Readings from Last 3 Encounters:  12/04/21 70  09/02/21 75  08/04/21 77   Valtrex - She takes 1000 Mg of Valtrex daily.    Albuterol - She occasionally uses Albuterol during the day if her wheezing worsens. She does not use a Cpap.   Reflux - Her reflux symptoms are controlled.   Health Maintenance Due  Topic Date Due   Zoster Vaccines- Shingrix (1 of 2) Never done   TETANUS/TDAP  07/03/2018   FOOT EXAM  11/04/2021    Past Medical History:  Diagnosis Date   Bronchitis    Chronic bronchitis ([place])    Cystitis    Diabetes type 2, controlled ([place])    Hematuria    urologice eval, Dr Estill Dooms   History of chest pain    Hyperbilirubinemia    Hyperlipemia    Hypertelorism    Hypertension    Leukocytosis    Obesity    Osteoarthritis    right knee   Sleep apnea 2012   slight    Vaginal Pap smear, abnormal    Vitamin D deficiency 02/09/2018    Past Surgical History:  Procedure Laterality Date   CERVICAL POLYPECTOMY  07/15/2020   Procedure: POLYPECTOMY with Myosure;  Surgeon: Lavonia Drafts, MD;  Location: [facility];  Service: Gynecology;;   COLONOSCOPY WITH PROPOFOL N/A 03/27/2015   Procedure: COLONOSCOPY WITH PROPOFOL;  Surgeon: Milus Banister, MD;  Location: [facility];  Service: Endoscopy;  Laterality: N/A;   COLONOSCOPY WITH PROPOFOL N/A 10/02/2020  Procedure: COLONOSCOPY WITH PROPOFOL;  Surgeon: Milus Banister, MD;  Location: [facility];  Service: Endoscopy;  Laterality: N/A;   COLPOSCOPY N/A 07/15/2020   Procedure: COLPOSCOPY;  Surgeon: Lavonia Drafts, MD;  Location: [facility];  Service: Gynecology;  Laterality: N/A;   DILATATION & CURETTAGE/HYSTEROSCOPY WITH MYOSURE N/A 01/05/2017   Procedure: DILATATION & CURETTAGE/HYSTEROSCOPY WITH MYOSURE;  Surgeon: Thurnell Lose, MD;  Location: [facility];  Service: Gynecology;  Laterality: N/A;  PostMenopausal Bleeding   HYSTEROSCOPY WITH D & C N/A 05/22/2019   Procedure: DILATATION AND CURETTAGE /HYSTEROSCOPY, POLYPECTOMY WITH MYOSURE;  Surgeon: Lavonia Drafts, MD;  Location: [facility];  Service: Gynecology;  Laterality: N/A;    HYSTEROSCOPY WITH D & C N/A 07/15/2020   Procedure: DILATATION AND CURETTAGE /HYSTEROSCOPY;  Surgeon: Lavonia Drafts, MD;  Location: [facility];  Service: Gynecology;  Laterality: N/A;   KNEE ARTHROSCOPY  04/2007   left knee   lap sleeve     TOTAL KNEE ARTHROPLASTY Left 10/2008   Dr Roxine Caddy   UPPER GI ENDOSCOPY N/A 10/14/2020   Procedure: UPPER GI ENDOSCOPY;  Surgeon: [name], MD;  Location: [facility];  Service: General;  Laterality: N/A;    Family History  Problem Relation Age of Onset   Heart failure Mother    Arthritis Mother    Hypertension Mother    Stroke Mother    Diabetes Mellitus II Mother    Heart attack Father 88   Diabetes Other    Arthritis Other    Stroke Other    Coronary artery disease Other    Hypertension Sister        x 2   CVA Sister    CAD Sister 34   Diabetes Mellitus II Sister    Cirrhosis Brother    Alcohol abuse Brother    Colon cancer Neg Hx     Social History   Socioeconomic History   Marital status: Single    Spouse name: Not on file   Number of children: 2   Years of education: Not on file   Highest education level: Not on file  Occupational History   Occupation: disabled  Tobacco Use   Smoking status: Never   Smokeless tobacco: Never  Vaping Use   Vaping Use: Never used  Substance and Sexual Activity   Alcohol use: No   Drug use: No   Sexual activity: Not Currently  Other Topics Concern   Not on file  Social History Narrative   Regular exercise:  No   Lives alone   Has 2 grown sons, both local.  1 grandson   On disability due to her knee pain   Completed 12th grade   Enjoys television   No pets.    Social Determinants of Health   Financial Resource Strain: Low Risk  (11/18/2020)   Overall Financial Resource Strain (CARDIA)    Difficulty of Paying Living Expenses: Not hard at all  Food Insecurity: No Food Insecurity (02/20/2020)   Hunger Vital Sign    Worried About Running Out of Food in the Last Year: Never true     Ran Out of Food in the Last Year: Never true  Transportation Needs: No Transportation Needs (11/18/2020)   PRAPARE - Hydrologist (Medical): No    Lack of Transportation (Non-Medical): No  Physical Activity: Insufficiently Active (11/18/2020)   Exercise Vital Sign    Days of Exercise per Week: 1 day    Minutes of Exercise per Session: 20 min  Stress: No Stress Concern Present (11/18/2020)   [place]    Feeling of Stress : Not at all  Social Connections: Moderately Isolated (11/18/2020)   Social Connection and Isolation Panel [NHANES]    Frequency of Communication with Friends and Family: More than three times a week    Frequency of Social Gatherings with Friends and Family: Once a week    Attends Religious Services: 1 to 4 times per year    Active Member of [employer] or Organizations: No    Attends Archivist Meetings: Never    Marital Status: Never married  Intimate Partner Violence: Not At Risk (11/18/2020)   Humiliation, Afraid, Rape, and Kick questionnaire    Fear of Current or Ex-Partner: No    Emotionally Abused: No    Physically Abused: No    Sexually Abused: No    Outpatient Medications Prior to Visit  Medication Sig Dispense Refill   albuterol (VENTOLIN HFA) 108 (90 Base) MCG/ACT inhaler USE 2 INHALATIONS BY MOUTH EVERY 6 HOURS AS NEEDED FOR WHEEZING  OR SHORTNESS OF BREATH 34 g 3   amLODipine (NORVASC) 5 MG tablet TAKE 1 TABLET BY MOUTH DAILY 90 tablet 1   desoximetasone (TOPICORT) 0.25 % cream Apply 1 application. topically 2 (two) times daily. 30 g 0   diclofenac Sodium (VOLTAREN) 1 % GEL Apply 2 g topically 4 (four) times daily. 100 g 1   losartan (COZAAR) 100 MG tablet TAKE 1 TABLET BY MOUTH  DAILY 90 tablet 3   Multiple Vitamin (MULTIVITAMIN WITH MINERALS) TABS tablet Take 1 tablet by mouth daily. Vitafusion     omeprazole (PRILOSEC) 20 MG capsule TAKE 1 CAPSULE BY MOUTH   DAILY 90 capsule 1   pravastatin (PRAVACHOL) 80 MG tablet TAKE 1 TABLET BY MOUTH DAILY 90 tablet 1   psyllium (METAMUCIL) 58.6 % powder Take 1 packet by mouth daily as needed (constipation).     valACYclovir (VALTREX) 1000 MG tablet TAKE 1 TABLET BY MOUTH DAILY 90 tablet 1   COVID-19 mRNA bivalent vaccine, Pfizer, (PFIZER COVID-19 VAC BIVALENT) injection Inject into the muscle. 0.3 mL 0   FLOVENT HFA 110 MCG/ACT inhaler USE 2 INHALATIONS BY MOUTH  TWICE DAILY 36 g 3   polyethylene glycol (MIRALAX / GLYCOLAX) 17 g packet Take 17 g by mouth daily as needed for moderate constipation or severe constipation.     No facility-administered medications prior to visit.    Allergies  Allergen Reactions   Sulfamethoxazole-Trimethoprim Itching   Sulfamethoxazole-Trimethoprim [Bactrim] Itching   Codeine Nausea And Vomiting and Anxiety    Review of Systems  Skin:        (+) Loss of Pigmentation Behind Right Knee       Objective:    Physical Exam Constitutional:      General: She is not in acute distress.    Appearance: Normal appearance. She is not ill-appearing.  HENT:     Head: Normocephalic and atraumatic.     Right Ear: External ear normal.     Left Ear: External ear normal.  Eyes:     Extraocular Movements: Extraocular movements intact.     Pupils: Pupils are equal, round, and reactive to light.  Cardiovascular:     Rate and Rhythm: Normal rate and regular rhythm.     Heart sounds: Normal heart sounds. No murmur heard.    No gallop.  Pulmonary:     Effort: Pulmonary effort is normal. No respiratory distress.  Breath sounds: Normal breath sounds. No wheezing or rales.  Skin:    General: Skin is warm and dry.     Comments: Hypopigmentation behind right knee  Neurological:     Mental Status: She is alert and oriented to person, place, and time.  Psychiatric:        Mood and Affect: Mood normal.        Behavior: Behavior normal.        Judgment: Judgment normal.     BP  (!) 120/55 (BP Location: Right Arm, Patient Position: Sitting, Cuff Size: Large)   Pulse 70   Temp 98.4 F (36.9 C) (Oral)   Resp 16   Wt 244 lb (110.7 kg)   LMP 11/10/2011   BMI 44.63 kg/m  Wt Readings from Last 3 Encounters:  12/04/21 244 lb (110.7 kg)  10/15/21 241 lb 9.6 oz (109.6 kg)  09/02/21 242 lb (109.8 kg)       Assessment & Plan:   Problem List Items Addressed This Visit       Unprioritized   Hyperlipidemia (Chronic)    Lab Results  Component Value Date   CHOL 169 08/04/2021   HDL 57.50 08/04/2021   LDLCALC 97 08/04/2021   TRIG 72.0 08/04/2021   CHOLHDL 3 08/04/2021  At goal on pravastatin, continue same       Vitamin D deficiency   Relevant Orders   Vitamin D (25 hydroxy)   Uncomplicated asthma    Stable on flovent and prn albuterol. Continue same.       Relevant Medications   fluticasone (FLOVENT HFA) 110 MCG/ACT inhaler   OSA (obstructive sleep apnea)    Mild, not on cpap. Continue current weight loss efforts.       Genital herpes    No breakouts on daily suppressive therapy.        Essential hypertension    BP Readings from Last 3 Encounters:  12/04/21 (!) 120/55  09/02/21 (!) 120/59  09/02/21 (!) 120/59   At goal, continue amlodipine and losartan  Continue same.       Relevant Orders   Comp Met (CMET)   Diabetes type 2, controlled ([place])    Lab Results  Component Value Date   HGBA1C 6.0 08/04/2021   HGBA1C 6.2 04/06/2021   HGBA1C 6.7 (H) 10/07/2020   Lab Results  Component Value Date   MICROALBUR 0.97 07/13/2011   LDLCALC 97 08/04/2021   CREATININE 0.74 08/04/2021   Wt Readings from Last 3 Encounters:  12/04/21 244 lb (110.7 kg)  10/15/21 241 lb 9.6 oz (109.6 kg)  09/02/21 242 lb (109.8 kg)  She is down about 70 lbs from her lap sleeve surgery.      Relevant Orders   Hemoglobin A1c   Other Visit Diagnoses     Bariatric surgery status    -  Primary   Relevant Orders   CBC with Differential/Platelet   Iron, TIBC and  Ferritin Panel   B12   Folate        Meds ordered this encounter  Medications   fluticasone (FLOVENT HFA) 110 MCG/ACT inhaler    Sig: USE 2 INHALATIONS BY MOUTH  TWICE DAILY    Dispense:  36 g    Refill:  3    Requesting 1 year supply    Order Specific Question:   Supervising Provider    Answer:   Penni Homans A [[id number]]    I, [name], NP, personally preformed the services described in  this documentation.  All medical record entries made by the scribe were at my direction and in my presence.  I have reviewed the chart and discharge instructions (if applicable) and agree that the record reflects my personal performance and is accurate and complete. 12/04/2021   I,Amber Collins,acting as a Education administrator for [name], NP.,have documented all relevant documentation on the behalf of [name], NP,as directed by  [name], NP while in the presence of [name], NP.   [name], NP

## 2021-12-04 NOTE — Assessment & Plan Note (Addendum)
No breakouts on daily suppressive therapy.

## 2021-12-04 NOTE — Assessment & Plan Note (Signed)
Lab Results  Component Value Date   CHOL 169 08/04/2021   HDL 57.50 08/04/2021   LDLCALC 97 08/04/2021   TRIG 72.0 08/04/2021   CHOLHDL 3 08/04/2021   At goal on pravastatin, continue same

## 2021-12-04 NOTE — Patient Instructions (Signed)
Please complete lab work prior to leaving.   

## 2021-12-04 NOTE — Assessment & Plan Note (Addendum)
Lab Results  Component Value Date   HGBA1C 6.0 08/04/2021   HGBA1C 6.2 04/06/2021   HGBA1C 6.7 (H) 10/07/2020   Lab Results  Component Value Date   MICROALBUR 0.97 07/13/2011   LDLCALC 97 08/04/2021   CREATININE 0.74 08/04/2021   Wt Readings from Last 3 Encounters:  12/04/21 244 lb (110.7 kg)  10/15/21 241 lb 9.6 oz (109.6 kg)  09/02/21 242 lb (109.8 kg)   She is down about 70 lbs from her lap sleeve surgery.

## 2021-12-04 NOTE — Assessment & Plan Note (Signed)
Stable on flovent and prn albuterol. Continue same.

## 2021-12-04 NOTE — Assessment & Plan Note (Signed)
Mild, not on cpap. Continue current weight loss efforts.

## 2021-12-05 LAB — IRON,TIBC AND FERRITIN PANEL
%SAT: 22 % (calc) (ref 16–45)
Ferritin: 93 ng/mL (ref 16–288)
Iron: 73 ug/dL (ref 45–160)
TIBC: 334 mcg/dL (calc) (ref 250–450)

## 2021-12-10 DIAGNOSIS — M1711 Unilateral primary osteoarthritis, right knee: Secondary | ICD-10-CM | POA: Diagnosis not present

## 2021-12-10 DIAGNOSIS — M25561 Pain in right knee: Secondary | ICD-10-CM | POA: Diagnosis not present

## 2021-12-10 DIAGNOSIS — G8929 Other chronic pain: Secondary | ICD-10-CM | POA: Diagnosis not present

## 2021-12-10 DIAGNOSIS — M25562 Pain in left knee: Secondary | ICD-10-CM | POA: Diagnosis not present

## 2021-12-17 ENCOUNTER — Other Ambulatory Visit: Payer: Self-pay | Admitting: Family

## 2021-12-21 ENCOUNTER — Other Ambulatory Visit: Payer: Self-pay | Admitting: Family

## 2021-12-28 ENCOUNTER — Ambulatory Visit
Admission: RE | Admit: 2021-12-28 | Discharge: 2021-12-28 | Disposition: A | Discharge status: Home / Self Care | Payer: Medicare Other | Source: Ambulatory Visit | Attending: Family | Admitting: Family

## 2021-12-28 DIAGNOSIS — Z1231 Encounter for screening mammogram for malignant neoplasm of breast: Secondary | ICD-10-CM

## 2021-12-31 DIAGNOSIS — K439 Ventral hernia without obstruction or gangrene: Secondary | ICD-10-CM | POA: Diagnosis not present

## 2021-12-31 DIAGNOSIS — M1711 Unilateral primary osteoarthritis, right knee: Secondary | ICD-10-CM | POA: Diagnosis not present

## 2021-12-31 DIAGNOSIS — E118 Type 2 diabetes mellitus with unspecified complications: Secondary | ICD-10-CM | POA: Diagnosis not present

## 2021-12-31 DIAGNOSIS — I10 Essential (primary) hypertension: Secondary | ICD-10-CM | POA: Diagnosis not present

## 2021-12-31 DIAGNOSIS — Z9884 Bariatric surgery status: Secondary | ICD-10-CM | POA: Diagnosis not present

## 2021-12-31 DIAGNOSIS — G4733 Obstructive sleep apnea (adult) (pediatric): Secondary | ICD-10-CM | POA: Diagnosis not present

## 2022-01-15 ENCOUNTER — Other Ambulatory Visit: Payer: Self-pay | Admitting: Family

## 2022-02-26 DIAGNOSIS — M1711 Unilateral primary osteoarthritis, right knee: Secondary | ICD-10-CM | POA: Diagnosis not present

## 2022-02-26 DIAGNOSIS — K439 Ventral hernia without obstruction or gangrene: Secondary | ICD-10-CM | POA: Diagnosis not present

## 2022-02-26 DIAGNOSIS — E118 Type 2 diabetes mellitus with unspecified complications: Secondary | ICD-10-CM | POA: Diagnosis not present

## 2022-02-26 DIAGNOSIS — Z9884 Bariatric surgery status: Secondary | ICD-10-CM | POA: Diagnosis not present

## 2022-02-26 DIAGNOSIS — G4733 Obstructive sleep apnea (adult) (pediatric): Secondary | ICD-10-CM | POA: Diagnosis not present

## 2022-02-26 DIAGNOSIS — I10 Essential (primary) hypertension: Secondary | ICD-10-CM | POA: Diagnosis not present

## 2022-03-19 ENCOUNTER — Other Ambulatory Visit: Payer: Self-pay | Admitting: Family

## 2022-03-30 ENCOUNTER — Other Ambulatory Visit: Payer: Self-pay | Admitting: Family

## 2022-04-06 ENCOUNTER — Other Ambulatory Visit (HOSPITAL_BASED_OUTPATIENT_CLINIC_OR_DEPARTMENT_OTHER): Payer: Self-pay

## 2022-04-06 ENCOUNTER — Ambulatory Visit (INDEPENDENT_AMBULATORY_CARE_PROVIDER_SITE_OTHER): Payer: Medicare Other | Admitting: Family

## 2022-04-06 DIAGNOSIS — Z23 Encounter for immunization: Secondary | ICD-10-CM | POA: Diagnosis not present

## 2022-04-06 DIAGNOSIS — E782 Mixed hyperlipidemia: Secondary | ICD-10-CM

## 2022-04-06 DIAGNOSIS — I10 Essential (primary) hypertension: Secondary | ICD-10-CM | POA: Diagnosis not present

## 2022-04-06 DIAGNOSIS — K219 Gastro-esophageal reflux disease without esophagitis: Secondary | ICD-10-CM

## 2022-04-06 DIAGNOSIS — E119 Type 2 diabetes mellitus without complications: Secondary | ICD-10-CM | POA: Diagnosis not present

## 2022-04-06 DIAGNOSIS — J45909 Unspecified asthma, uncomplicated: Secondary | ICD-10-CM | POA: Diagnosis not present

## 2022-04-06 LAB — BASIC METABOLIC PANEL
BUN: 18 mg/dL (ref 6–23)
CO2: 30 mEq/L (ref 19–32)
Calcium: 9.2 mg/dL (ref 8.4–10.5)
Chloride: 106 mEq/L (ref 96–112)
Creatinine, Ser: 0.78 mg/dL (ref 0.40–1.20)
GFR: 80.22 mL/min (ref >60.00)
Glucose, Bld: 90 mg/dL (ref 70–99)
Potassium: 3.9 mEq/L (ref 3.5–5.1)
Sodium: 142 mEq/L (ref 135–145)

## 2022-04-06 LAB — HEMOGLOBIN A1C: Hgb A1c MFr Bld: 6.2 % (ref 4.6–6.5)

## 2022-04-06 LAB — MICROALBUMIN / CREATININE URINE RATIO
Creatinine,U: 138.9 mg/dL
Microalb Creat Ratio: 0.5 mg/g (ref 0.0–30.0)
Microalb, Ur: <0.7 mg/dL (ref 0.0–1.9)

## 2022-04-06 MED ORDER — COMIRNATY 30 MCG/0.3ML IM SUSY
PREFILLED_SYRINGE | INTRAMUSCULAR | 0 refills | Status: DC
  Filled 2022-04-06: qty 0.3, 1d supply, fill #0

## 2022-04-06 NOTE — Progress Notes (Signed)
Subjective:   By signing my name below, I, [name], attest that this documentation has been prepared under the direction and in the presence of [name], NP 04/06/2022    Patient ID: Leah Hampton, female    DOB: [birth year], 66 y.o.   MRN: [id number]  Chief Complaint  Patient presents with   Hypertension    Here for follow up   Diabetes    Here for follow up    HPI Patient is in today for an office visit  Phentermine: She was previously taking 37.5 mg of Phentermine prescribed by Dr.Stetzer on 12/31/2021. She states that she was then experiencing lightheadedness and headaches. She was then seen by Dr.Konecny again on 02/26/2022 and her medication was decreased to 15 mg. She has noticed that the medication is suppressing her appetite. She believes that she is losing weight Wt Readings from Last 3 Encounters:  04/06/22 239 lb (108.4 kg)  12/04/21 244 lb (110.7 kg)  10/15/21 241 lb 9.6 oz (109.6 kg)   A1C: Her A1C level is at goal. She states that she is maintaining a healthy and regularly walking on the treadmill Lab Results  Component Value Date   HGBA1C 6.1 12/04/2021   Blood Pressure: Her blood pressure as of today's visit is normal BP Readings from Last 3 Encounters:  04/06/22 115/62  12/04/21 (!) 120/55  09/02/21 (!) 120/59   Pulse Readings from Last 3 Encounters:  04/06/22 80  12/04/21 70  09/02/21 75   Heartburn: She states that symptoms are controlled. She is currently taking 20 mg of Omeprazole. She is inquiring about when/if she can discontinue the medication.  Asthma: She reports that symptoms are controlled. She finds herself using the Albuterol medication rarely.   Cholesterol: Her cholesterol levels are normal. She is continuing to take 80 mg of Pravastatin Lab Results  Component Value Date   CHOL 169 08/04/2021   HDL 57.50 08/04/2021   LDLCALC 97 08/04/2021   TRIG 72.0 08/04/2021   CHOLHDL 3 08/04/2021   Immunizations: She is  interested in receiving an influenza vaccine during today's visit. She states that she has received the first dose of the Shingles vaccine    Health Maintenance Due  Topic Date Due   Zoster Vaccines- Shingrix (1 of 2) Never done   Diabetic kidney evaluation - Urine ACR  07/12/2012   COVID-19 Vaccine (6 - 2023-24 season) 01/15/2022    Past Medical History:  Diagnosis Date   Bronchitis    Chronic bronchitis (HCC)    Cystitis    Diabetes type 2, controlled ([place])    Hematuria    urologice eval, Dr Estill Dooms   History of chest pain    Hyperbilirubinemia    Hyperlipemia    Hypertelorism    Hypertension    Leukocytosis    Obesity    Osteoarthritis    right knee   Sleep apnea 2012   slight    Vaginal Pap smear, abnormal    Vitamin D deficiency 02/09/2018    Past Surgical History:  Procedure Laterality Date   CERVICAL POLYPECTOMY  07/15/2020   Procedure: POLYPECTOMY with Myosure;  Surgeon: Lavonia Drafts, MD;  Location: [facility];  Service: Gynecology;;   COLONOSCOPY WITH PROPOFOL N/A 03/27/2015   Procedure: COLONOSCOPY WITH PROPOFOL;  Surgeon: Milus Banister, MD;  Location: [facility];  Service: Endoscopy;  Laterality: N/A;   COLONOSCOPY WITH PROPOFOL N/A 10/02/2020   Procedure: COLONOSCOPY WITH PROPOFOL;  Surgeon: Milus Banister, MD;  Location: [facility];  Service: Endoscopy;  Laterality: N/A;   COLPOSCOPY N/A 07/15/2020   Procedure: COLPOSCOPY;  Surgeon: Lavonia Drafts, MD;  Location: [facility];  Service: Gynecology;  Laterality: N/A;   DILATATION & CURETTAGE/HYSTEROSCOPY WITH MYOSURE N/A 01/05/2017   Procedure: DILATATION & CURETTAGE/HYSTEROSCOPY WITH MYOSURE;  Surgeon: Thurnell Lose, MD;  Location: [facility];  Service: Gynecology;  Laterality: N/A;  PostMenopausal Bleeding   HYSTEROSCOPY WITH D & C N/A 05/22/2019   Procedure: DILATATION AND CURETTAGE /HYSTEROSCOPY, POLYPECTOMY WITH MYOSURE;  Surgeon: Lavonia Drafts, MD;  Location: [facility];  Service:  Gynecology;  Laterality: N/A;   HYSTEROSCOPY WITH D & C N/A 07/15/2020   Procedure: DILATATION AND CURETTAGE /HYSTEROSCOPY;  Surgeon: Lavonia Drafts, MD;  Location: [facility];  Service: Gynecology;  Laterality: N/A;   KNEE ARTHROSCOPY  04/2007   left knee   lap sleeve     TOTAL KNEE ARTHROPLASTY Left 10/2008   Dr Roxine Caddy   UPPER GI ENDOSCOPY N/A 10/14/2020   Procedure: UPPER GI ENDOSCOPY;  Surgeon: [name], MD;  Location: [facility];  Service: General;  Laterality: N/A;    Family History  Problem Relation Age of Onset   Heart failure Mother    Arthritis Mother    Hypertension Mother    Stroke Mother    Diabetes Mellitus II Mother    Heart attack Father 83   Diabetes Other    Arthritis Other    Stroke Other    Coronary artery disease Other    Hypertension Sister        x 2   CVA Sister    CAD Sister 88   Diabetes Mellitus II Sister    Cirrhosis Brother    Alcohol abuse Brother    Colon cancer Neg Hx     Social History   Socioeconomic History   Marital status: Single    Spouse name: Not on file   Number of children: 2   Years of education: Not on file   Highest education level: Not on file  Occupational History   Occupation: disabled  Tobacco Use   Smoking status: Never   Smokeless tobacco: Never  Vaping Use   Vaping Use: Never used  Substance and Sexual Activity   Alcohol use: No   Drug use: No   Sexual activity: Not Currently  Other Topics Concern   Not on file  Social History Narrative   Regular exercise:  No   Lives alone   Has 2 grown sons, both local.  1 grandson   On disability due to her knee pain   Completed 12th grade   Enjoys television   No pets.    Social Determinants of Health   Financial Resource Strain: Low Risk  (11/18/2020)   Overall Financial Resource Strain (CARDIA)    Difficulty of Paying Living Expenses: Not hard at all  Food Insecurity: No Food Insecurity (02/20/2020)   Hunger Vital Sign    Worried About Running Out of Food  in the Last Year: Never true    Ran Out of Food in the Last Year: Never true  Transportation Needs: No Transportation Needs (11/18/2020)   PRAPARE - Hydrologist (Medical): No    Lack of Transportation (Non-Medical): No  Physical Activity: Insufficiently Active (11/18/2020)   Exercise Vital Sign    Days of Exercise per Week: 1 day    Minutes of Exercise per Session: 20 min  Stress: No Stress Concern Present (11/18/2020)   [employer]  of Occupational Health - Occupational Stress Questionnaire    Feeling of Stress : Not at all  Social Connections: Moderately Isolated (11/18/2020)   Social Connection and Isolation Panel [NHANES]    Frequency of Communication with Friends and Family: More than three times a week    Frequency of Social Gatherings with Friends and Family: Once a week    Attends Religious Services: 1 to 4 times per year    Active Member of [employer] or Organizations: No    Attends Archivist Meetings: Never    Marital Status: Never married  Intimate Partner Violence: Not At Risk (11/18/2020)   Humiliation, Afraid, Rape, and Kick questionnaire    Fear of Current or Ex-Partner: No    Emotionally Abused: No    Physically Abused: No    Sexually Abused: No    Outpatient Medications Prior to Visit  Medication Sig Dispense Refill   albuterol (VENTOLIN HFA) 108 (90 Base) MCG/ACT inhaler USE 2 INHALATIONS BY MOUTH EVERY 6 HOURS AS NEEDED FOR WHEEZING  OR SHORTNESS OF BREATH 34 g 3   amLODipine (NORVASC) 5 MG tablet Take 1 tablet (5 mg total) by mouth daily. 90 tablet 1   desoximetasone (TOPICORT) 0.25 % cream Apply 1 application. topically 2 (two) times daily. 30 g 0   diclofenac Sodium (VOLTAREN) 1 % GEL Apply 2 g topically 4 (four) times daily. 100 g 1   fluticasone (FLOVENT HFA) 110 MCG/ACT inhaler USE 2 INHALATIONS BY MOUTH  TWICE DAILY 36 g 3   losartan (COZAAR) 100 MG tablet TAKE 1 TABLET BY MOUTH DAILY 100 tablet 2   Multiple Vitamin  (MULTIVITAMIN WITH MINERALS) TABS tablet Take 1 tablet by mouth daily. Vitafusion     phentermine 37.5 MG capsule Take 37.5 mg by mouth every morning.     pravastatin (PRAVACHOL) 80 MG tablet TAKE 1 TABLET BY MOUTH DAILY 100 tablet 1   psyllium (METAMUCIL) 58.6 % powder Take 1 packet by mouth daily as needed (constipation).     valACYclovir (VALTREX) 1000 MG tablet TAKE 1 TABLET BY MOUTH DAILY 100 tablet 1   omeprazole (PRILOSEC) 20 MG capsule TAKE 1 CAPSULE BY MOUTH DAILY 100 capsule 1   No facility-administered medications prior to visit.    Allergies  Allergen Reactions   Sulfamethoxazole-Trimethoprim Itching   Sulfamethoxazole-Trimethoprim [Bactrim] Itching   Codeine Nausea And Vomiting and Anxiety    ROS See HPI    Objective:    Physical Exam Constitutional:      General: She is not in acute distress.    Appearance: Normal appearance. She is not ill-appearing.  HENT:     Head: Normocephalic and atraumatic.     Right Ear: External ear normal.     Left Ear: External ear normal.  Eyes:     Extraocular Movements: Extraocular movements intact.     Pupils: Pupils are equal, round, and reactive to light.  Cardiovascular:     Rate and Rhythm: Normal rate and regular rhythm.     Heart sounds: Normal heart sounds. No murmur heard.    No gallop.  Pulmonary:     Effort: Pulmonary effort is normal. No respiratory distress.     Breath sounds: Normal breath sounds. No wheezing or rales.  Skin:    General: Skin is warm and dry.  Neurological:     Mental Status: She is alert and oriented to person, place, and time.  Psychiatric:        Mood and Affect: Mood normal.  Behavior: Behavior normal.        Judgment: Judgment normal.    Diabetic Foot Exam - Simple   Simple Foot Form Diabetic Foot exam was performed with the following findings: Yes 04/06/2022 10:30 AM  Visual Inspection No deformities, no ulcerations, no other skin breakdown bilaterally: Yes Sensation  Testing Intact to touch and monofilament testing bilaterally: Yes Pulse Check Posterior Tibialis and Dorsalis pulse intact bilaterally: Yes Comments      BP 115/62 (BP Location: Right Arm, Patient Position: Sitting, Cuff Size: Large)   Pulse 80   Temp 97.9 F (36.6 C) (Oral)   Resp 16   Wt 239 lb (108.4 kg)   LMP 11/10/2011   SpO2 100%   BMI 43.71 kg/m  Wt Readings from Last 3 Encounters:  04/06/22 239 lb (108.4 kg)  12/04/21 244 lb (110.7 kg)  10/15/21 241 lb 9.6 oz (109.6 kg)       Assessment & Plan:   Problem List Items Addressed This Visit       Unprioritized   Hyperlipidemia (Chronic)    Lab Results  Component Value Date   CHOL 169 08/04/2021   HDL 57.50 08/04/2021   LDLCALC 97 08/04/2021   TRIG 72.0 08/04/2021   CHOLHDL 3 08/04/2021  LDL at goal on pravastatin.  Continue same.        Uncomplicated asthma    Stable with rare use of albuterol. Continue same.       Gastroesophageal reflux disease    Stable on omeprazole '20mg'$ , wishes to try coming off of it.  Will d/c.       Essential hypertension    BP stable on losartan and amlodipine.  Continue same.       Diabetes type 2, controlled ([place])    Lab Results  Component Value Date   HGBA1C 6.1 12/04/2021   HGBA1C 6.0 08/04/2021   HGBA1C 6.2 04/06/2021   Lab Results  Component Value Date   MICROALBUR 0.97 07/13/2011   LDLCALC 97 08/04/2021   CREATININE 0.74 12/04/2021  Continue DM diet and exercise.        Relevant Orders   Hemoglobin A1c   Urine Microalbumin w/creat. ratio   Basic metabolic panel   Other Visit Diagnoses     Morbid obesity ([place])    -  Primary   Relevant Medications   phentermine 37.5 MG capsule   Needs flu shot       Relevant Orders   Flu Vaccine QUAD 6+ mos PF IM (Fluarix Quad PF) (Completed)      No orders of the defined types were placed in this encounter.   I, [name], NP, personally preformed the services described in this documentation.  All  medical record entries made by the scribe were at my direction and in my presence.  I have reviewed the chart and discharge instructions (if applicable) and agree that the record reflects my personal performance and is accurate and complete. 04/06/2022   I,Amber Collins,acting as a scribe for [name], NP.,have documented all relevant documentation on the behalf of [name], NP,as directed by  [name], NP while in the presence of [name], NP.    [name], NP

## 2022-04-06 NOTE — Assessment & Plan Note (Signed)
Lab Results  Component Value Date   HGBA1C 6.1 12/04/2021   HGBA1C 6.0 08/04/2021   HGBA1C 6.2 04/06/2021   Lab Results  Component Value Date   MICROALBUR 0.97 07/13/2011   LDLCALC 97 08/04/2021   CREATININE 0.74 12/04/2021   Continue DM diet and exercise.

## 2022-04-06 NOTE — Assessment & Plan Note (Signed)
BP stable on losartan and amlodipine.  Continue same.

## 2022-04-06 NOTE — Assessment & Plan Note (Signed)
Lab Results  Component Value Date   CHOL 169 08/04/2021   HDL 57.50 08/04/2021   LDLCALC 97 08/04/2021   TRIG 72.0 08/04/2021   CHOLHDL 3 08/04/2021  LDL at goal on pravastatin.  Continue same.

## 2022-04-06 NOTE — Assessment & Plan Note (Signed)
Stable on omeprazole '20mg'$ , wishes to try coming off of it.  Will d/c.

## 2022-04-06 NOTE — Assessment & Plan Note (Signed)
Stable with rare use of albuterol. Continue same.

## 2022-04-19 DIAGNOSIS — M1711 Unilateral primary osteoarthritis, right knee: Secondary | ICD-10-CM | POA: Diagnosis not present

## 2022-05-04 ENCOUNTER — Encounter: Payer: Self-pay | Admitting: Dietician

## 2022-05-04 ENCOUNTER — Encounter: Payer: Medicare Other | Attending: General Surgery | Admitting: Dietician

## 2022-05-04 ENCOUNTER — Ambulatory Visit: Payer: Medicare Other | Admitting: Skilled Nursing Facility1

## 2022-05-04 VITALS — Ht 62.0 in | Wt 238.7 lb

## 2022-05-04 DIAGNOSIS — E119 Type 2 diabetes mellitus without complications: Secondary | ICD-10-CM | POA: Insufficient documentation

## 2022-05-04 DIAGNOSIS — E669 Obesity, unspecified: Secondary | ICD-10-CM | POA: Insufficient documentation

## 2022-05-04 NOTE — Progress Notes (Signed)
Bariatric Nutrition Follow-Up Visit Medical Nutrition Therapy   Post-Operative sleeve Surgery Surgery Date: 10/14/2020  NUTRITION ASSESSMENT  Anthropometrics  Start weight at [facility]: 312 lbs (date: 02/20/2020) Height: 62 in Today's weight: 238.7 lbs  Clinical  Medical hx: OSA, DM, HTN Medications:  see list, omeprazole, phentermine Labs:    Lifestyle & Dietary Hx  Pt states she has started taking phentermine, stating she is going on the second month.  Pt states her surgeon started her on the maximum, stating she was so jittery and keeping her up at night.  Pt states the surgeon cut the dose back and she can tolerate it a little better. Pt states she got a Cortizone shot in her knee two weeks ago.  Pt states she can still use the treadmill at the gym. Pt states she sometimes loses weight and then hits a stall. Pt states she likes fish, stating she has it every Friday. Pt states she sometimes drinks "Bright & Early", by [facility].  When we looked at the ingredients and nutrition facts, she states she did not know it had so much sugar.  Pt states she is not buying that anymore. Pt states she uses metamucil, stating she has some Murelax also.  Estimated daily fluid intake: unknown maybe 50 oz Estimated daily protein intake: 60+ g Supplements: multi (approved) and calcium Current average weekly physical activity: treadmill 30-40 minutes 4-5 days a week walking on the treadmill; walks at the grocery store   24-Hr Dietary Recall First Meal: protein shake or oatmeal and sausage and egg biscuit Snack:  Second Meal 12:30: crab cake on a bun Snack:   Third Meal: chicken wings and salad Snack:  Beverages: water, water + flavorings, coffee, [employer]'s Bright & Early  Post-Op Goals/ Signs/ Symptoms Using straws: no Drinking while eating: no Chewing/swallowing difficulties: no Changes in vision: no Changes to mood/headaches: no Hair loss/changes to skin/nails: no Difficulty  focusing/concentrating: no Sweating: no Dizziness/lightheadedness: no Palpitations: no  Carbonated/caffeinated beverages: no N/V/D/C/Gas: using metamucil daily, bowel movement daily Abdominal pain: no Dumping syndrome: no    NUTRITION DIAGNOSIS  Overweight/obesity ([place]-3.3) related to past poor dietary habits and physical inactivity as evidenced by completed bariatric surgery and following dietary guidelines for continued weight loss and healthy nutrition status.     NUTRITION INTERVENTION: continued Nutrition counseling (C-1) and education (E-2) to facilitate bariatric surgery goals, including: The importance of consuming adequate calories as well as certain nutrients daily due to the body's need for essential vitamins, minerals, and fats The importance of daily physical activity and to reach a goal of at least 150 minutes of moderate to vigorous physical activity weekly (or as directed by their physician) due to benefits such as increased musculature and improved lab values The importance of intuitive eating specifically learning hunger-satiety cues and understanding the importance of learning a new body: The importance of mindful eating to avoid grazing behaviors  Importance of vegetables To have an overall healthy diet, adult men and women are recommended to consume anywhere from 2-3 cups of vegetables daily. Vegetables provide a wide range of vitamins and minerals such as vitamin A, vitamin C, potassium, and folic acid. According to the [facility], including fruit and vegetables daily may reduce the risk of cardiovascular disease, certain cancers, and other non-communicable diseases. Why you need complex carbohydrates: Whole grains and other complex carbohydrates are required to have a healthy diet. Whole grains provide fiber which can help with blood glucose levels and help keep you satiated. Fruits  and starchy vegetables provide essential vitamins and minerals required for  immune function, eyesight support, brain support, bone density, wound healing and many other functions within the body. According to the current evidenced based 2020-2025 Dietary Guidelines for Americans, complex carbohydrates are part of a healthy eating pattern which is associated with a decreased risk for type 2 diabetes, cancers, and cardiovascular disease.  Purpose of hydration: Water makes up over 50% of your total body water, and is part of many organs throughout the body. Water is essential to transport digested nutrients, regulate body temperature, rid the body of waste products, and protects joints and the spinal cord. When not properly hydrated you will begin to experience headaches, cramps and dizziness. Further dehydration can result in rapid heart rate, shock, oliguria, and may cause seizures.  https://www.merckmanuals.com/home/hormonal-and-metabolic-disordehttps://www.usgs.gov/special-topic/water-science-school/science/water-you-water-and-human-body?qt-science_center_objects=0#qt-science_center_objectsrs/water-balance/about-body-water HistoricalGrowth.gl https://www.stevens.org/ PimpTShirt.fi https://www.health.InvestmentBrowse.at  Encouraged patient to honor their body's internal hunger and fullness cues.  Throughout the day, check in mentally and rate hunger. Stop eating when satisfied not full regardless of how much food is left on the plate.  Get more if still hungry 20-30 minutes later.  The key is to honor satisfaction so throughout the meal, rate fullness factor and stop when comfortably satisfied not physically full. The key is to honor hunger and fullness without any feelings of guilt or shame.  Pay attention to what the internal cues are, rather than any external factors. This will enhance the  confidence you have in listening to your own body and following those internal cues enabling you to increase how often you eat when you are hungry not out of appetite and stop when you are satisfied not full.  Encouraged pt to continue to eat balanced meals inclusive of non starchy vegetables 2 times a day 7 days a week Encouraged pt to choose lean protein sources: limiting beef, pork, sausage, hotdogs, and lunch meat Encourage pt to choose healthy fats such as plant based limiting animal fats Encouraged pt to continue to drink a minium 64 fluid ounces with half being plain water to satisfy proper hydration   Goals: -Continue: ensure you have non starchy vegetables 2 times a day 7 days a week -Continue: include resistance activities 2-3 times a week as appropriate with hernia -Continue: ensure you are drinking at least 32 ounces of plain water -New: avoid sugar sweetened beverages (avoid [employer]'s "Bright & Early") -New: look for whole wheat/grain options for bread and buns -New: check nutrition facts for your metamucil, make sure it does not have sugar. Use your Miralax instead  Handouts Provided Include  Bariatric MyPlate  Learning Style & Readiness for Change Teaching method utilized: Visual & Auditory  Demonstrated degree of understanding via: Teach Back  Readiness Level: action Barriers to learning/adherence to lifestyle change: none identified   RD's Notes for Next Visit Assess adherence to pt chosen goals    MONITORING & EVALUATION Dietary intake, weekly physical activity, body weight  Next Steps Patient is to follow-up in May

## 2022-05-12 ENCOUNTER — Other Ambulatory Visit: Payer: Self-pay | Admitting: Family

## 2022-05-25 ENCOUNTER — Telehealth: Payer: Self-pay

## 2022-05-25 MED ORDER — BECLOMETHASONE DIPROP HFA 80 MCG/ACT IN AERB: 1.0000 | INHALATION_SPRAY | Freq: Two times a day (BID) | RESPIRATORY_TRACT | 2 refills | Status: DC

## 2022-05-25 NOTE — Telephone Encounter (Signed)
Flovent (brand name) no longer available. Pt's insurance does not cover generic fluticasone 191mg/act. Please advise alternative?

## 2022-05-25 NOTE — Telephone Encounter (Signed)
Please advise pt that I am sending Qvar instead.  Hopefully this will be covered.

## 2022-05-26 NOTE — Telephone Encounter (Signed)
Patient advised of change in therapy

## 2022-06-09 DIAGNOSIS — E119 Type 2 diabetes mellitus without complications: Secondary | ICD-10-CM | POA: Diagnosis not present

## 2022-06-09 DIAGNOSIS — H2513 Age-related nuclear cataract, bilateral: Secondary | ICD-10-CM | POA: Diagnosis not present

## 2022-06-09 LAB — HM DIABETES EYE EXAM

## 2022-06-23 DIAGNOSIS — M1711 Unilateral primary osteoarthritis, right knee: Secondary | ICD-10-CM | POA: Diagnosis not present

## 2022-06-23 DIAGNOSIS — Z9884 Bariatric surgery status: Secondary | ICD-10-CM | POA: Diagnosis not present

## 2022-06-23 DIAGNOSIS — I10 Essential (primary) hypertension: Secondary | ICD-10-CM | POA: Diagnosis not present

## 2022-06-23 DIAGNOSIS — E118 Type 2 diabetes mellitus with unspecified complications: Secondary | ICD-10-CM | POA: Diagnosis not present

## 2022-06-23 DIAGNOSIS — K439 Ventral hernia without obstruction or gangrene: Secondary | ICD-10-CM | POA: Diagnosis not present

## 2022-07-01 ENCOUNTER — Ambulatory Visit (INDEPENDENT_AMBULATORY_CARE_PROVIDER_SITE_OTHER): Payer: 59 | Admitting: Podiatry

## 2022-07-01 ENCOUNTER — Ambulatory Visit: Payer: 59

## 2022-07-01 DIAGNOSIS — M779 Enthesopathy, unspecified: Secondary | ICD-10-CM | POA: Diagnosis not present

## 2022-07-01 DIAGNOSIS — R52 Pain, unspecified: Secondary | ICD-10-CM | POA: Diagnosis not present

## 2022-07-01 DIAGNOSIS — M79672 Pain in left foot: Secondary | ICD-10-CM | POA: Diagnosis not present

## 2022-07-01 DIAGNOSIS — M79671 Pain in right foot: Secondary | ICD-10-CM | POA: Diagnosis not present

## 2022-07-01 MED ORDER — DESOXIMETASONE 0.25 % EX CREA: 1.0000 | TOPICAL_CREAM | Freq: Two times a day (BID) | CUTANEOUS | 0 refills | Status: DC

## 2022-07-01 MED ORDER — TRIAMCINOLONE ACETONIDE 10 MG/ML IJ SUSP
10.0000 mg | Freq: Once | INTRAMUSCULAR | Status: AC
  Administered 2022-07-01: 10 mg

## 2022-07-01 NOTE — Patient Instructions (Signed)

## 2022-07-01 NOTE — Progress Notes (Signed)
Subjective: Chief Complaint  Patient presents with   Foot Pain    Bilateral feet, left over right    67 year old female presents the office today with above concerns, which are new.  She gets pain to the ball of the left foot and the top of the foot as well.  On the right side she states she gets pain after sitting and she stands back up on the top of her foot.  She does not recall any recent injuries.  She has been trying to walk on the treadmill more which may have aggravated her symptoms.  No swelling.  No numbness or tingling.  No other concerns.    Objective: AAO x3, NAD DP/PT pulses palpable bilaterally, CRT less than 3 seconds Tenderness palpation left third interspace.  No palpable neuroma noted today.  There is no area pinpoint tenderness.  She gets diffuse discomfort submetatarsal bilaterally.  Mild discomfort to the dorsal aspect of the right midfoot but no specific area pinpoint tenderness.  There is no edema, erythema.  Flexor, extensor tendons are intact.  MMT 5/5. No pain with calf compression, swelling, warmth, erythema  Assessment: Capsulitis, tendinitis  Plan: -All treatment options discussed with the patient including all alternatives, risks, complications.  -X-rays were obtained reviewed bilaterally.  3 views of the foot were obtained.  Dorsal spurring present.  No evidence of acute fracture. -Steroid injection performed left third interspace.  Skin was cleaned with alcohol and mixture of 1 cc Kenalog 10, 0.5 cc Marcaine plain, 0.5 cc of lidocaine plain was infiltrated into the third interspace without complications.  Postinjection care discussed.  Tolerated well. -We discussed using good arch support particularly with walking on the treadmill. -Discussed different topical creams that she can use as well.  Return if symptoms worsen or fail to improve.  Leah Hampton DPM

## 2022-07-04 ENCOUNTER — Other Ambulatory Visit: Payer: Self-pay | Admitting: Family

## 2022-07-05 NOTE — Progress Notes (Signed)
Subjective:   By signing my name below, I, Leah Hampton, attest that this documentation has been prepared under the direction and in the presence of [name], NP 07/06/22   Patient ID: Leah Hampton, female    DOB: 03-Sep-1955, 67 y.o.   MRN: [id number]  Chief Complaint  Patient presents with   Hypertension    Here for follow up   Diabetes    Here for follow up    HPI Patient is in today for a 3 month follow up.   Podiatry Visit: She recently visited Dr. Jacqualyn Posey for foot pain, tendinitis, and capsulitis.   Hypertension: She has been managing her hypertension well. She is compliant with her Amlodipine 5 mg daily and Losartan 100 mg daily.  Diabetes: She has been managing her diabetes. She is interested in losing weight. She is interested in taking Mounjaro.   Asthma: She has been doing well. She has been compliant with her QVar. She has only needed to use albuterol every once in a while.   Herpes: She is compliant with her Valtrex, 1000 mg daily as needed.  Vitamin D: She is no longer taking OTC Vitamin D supplements.  Past Medical History:  Diagnosis Date   Bronchitis    Chronic bronchitis ([place])    Cystitis    Diabetes type 2, controlled ([place])    Hematuria    urologice eval, Dr Estill Dooms   History of chest pain    Hyperbilirubinemia    Hyperlipemia    Hypertelorism    Hypertension    Leukocytosis    Obesity    Osteoarthritis    right knee   Sleep apnea 2012   slight    Vaginal Pap smear, abnormal    Vitamin D deficiency 02/09/2018    Past Surgical History:  Procedure Laterality Date   CERVICAL POLYPECTOMY  07/15/2020   Procedure: POLYPECTOMY with Myosure;  Surgeon: Lavonia Drafts, MD;  Location: [place];  Service: Gynecology;;   COLONOSCOPY WITH PROPOFOL N/A 03/27/2015   Procedure: COLONOSCOPY WITH PROPOFOL;  Surgeon: Milus Banister, MD;  [facility];  Service: Endoscopy;  Laterality: N/A;   COLONOSCOPY WITH PROPOFOL N/A  10/02/2020   Procedure: COLONOSCOPY WITH PROPOFOL;  Surgeon: Milus Banister, MD;  [facility];  Service: Endoscopy;  Laterality: N/A;   COLPOSCOPY N/A 07/15/2020   Procedure: COLPOSCOPY;  Surgeon: Lavonia Drafts, MD;  Location: [place];  Service: Gynecology;  Laterality: N/A;   DILATATION & CURETTAGE/HYSTEROSCOPY WITH MYOSURE N/A 01/05/2017   Procedure: DILATATION & CURETTAGE/HYSTEROSCOPY WITH MYOSURE;  Surgeon: Thurnell Lose, MD;  Location: [facility];  Service: Gynecology;  Laterality: N/A;  PostMenopausal Bleeding   HYSTEROSCOPY WITH D & C N/A 05/22/2019   Procedure: DILATATION AND CURETTAGE /HYSTEROSCOPY, POLYPECTOMY WITH MYOSURE;  Surgeon: Lavonia Drafts, MD;  [facility];  Service: Gynecology;  Laterality: N/A;   HYSTEROSCOPY WITH D & C N/A 07/15/2020   Procedure: DILATATION AND CURETTAGE /HYSTEROSCOPY;  Surgeon: Lavonia Drafts, MD;  Location: [facility];  Service: Gynecology;  Laterality: N/A;   KNEE ARTHROSCOPY  04/2007   left knee   lap sleeve     TOTAL KNEE ARTHROPLASTY Left 10/2008   Dr Roxine Caddy   UPPER GI ENDOSCOPY N/A 10/14/2020   Procedure: UPPER GI ENDOSCOPY;  Surgeon: [name], MD;  [facility];  Service: General;  Laterality: N/A;    Family History  Problem Relation Age of Onset   Heart failure Mother    Arthritis Mother  Hypertension Mother    Stroke Mother    Diabetes Mellitus II Mother    Heart attack Father 29   Diabetes Other    Arthritis Other    Stroke Other    Coronary artery disease Other    Hypertension Sister        x 2   CVA Sister    CAD Sister 68   Diabetes Mellitus II Sister    Cirrhosis Brother    Alcohol abuse Brother    Colon cancer Neg Hx     Social History   Socioeconomic History   Marital status: Single    Spouse name: Not on file   Number of children: 2   Years of education: Not on file   Highest education level: Not on file  Occupational History   Occupation: disabled  Tobacco Use    Smoking status: Never   Smokeless tobacco: Never  Vaping Use   Vaping Use: Never used  Substance and Sexual Activity   Alcohol use: No   Drug use: No   Sexual activity: Not Currently  Other Topics Concern   Not on file  Social History Narrative   Regular exercise:  No   Lives alone   Has 2 grown sons, both local.  1 grandson   On disability due to her knee pain   Completed 12th grade   Enjoys television   No pets.    Social Determinants of Health   Financial Resource Strain: Low Risk  (11/18/2020)   Overall Financial Resource Strain (CARDIA)    Difficulty of Paying Living Expenses: Not hard at all  Food Insecurity: No Food Insecurity (02/20/2020)   Hunger Vital Sign    Worried About Running Out of Food in the Last Year: Never true    Ran Out of Food in the Last Year: Never true  Transportation Needs: No Transportation Needs (11/18/2020)   PRAPARE - Hydrologist (Medical): No    Lack of Transportation (Non-Medical): No  Physical Activity: Insufficiently Active (11/18/2020)   Exercise Vital Sign    Days of Exercise per Week: 1 day    Minutes of Exercise per Session: 20 min  Stress: No Stress Concern Present (11/18/2020)   [place]    Feeling of Stress : Not at all  Social Connections: Moderately Isolated (11/18/2020)   Social Connection and Isolation Panel [NHANES]    Frequency of Communication with Friends and Family: More than three times a week    Frequency of Social Gatherings with Friends and Family: Once a week    Attends Religious Services: 1 to 4 times per year    Active Member of [employer] or Organizations: No    Attends Archivist Meetings: Never    Marital Status: Never married  Intimate Partner Violence: Not At Risk (11/18/2020)   Humiliation, Afraid, Rape, and Kick questionnaire    Fear of Current or Ex-Partner: No    Emotionally Abused: No    Physically Abused: No     Sexually Abused: No    Outpatient Medications Prior to Visit  Medication Sig Dispense Refill   albuterol (VENTOLIN HFA) 108 (90 Base) MCG/ACT inhaler USE 2 INHALATIONS BY MOUTH EVERY 6 HOURS AS NEEDED FOR WHEEZING  OR SHORTNESS OF BREATH 54 g 3   amLODipine (NORVASC) 5 MG tablet Take 1 tablet (5 mg total) by mouth daily. 90 tablet 1   beclomethasone (QVAR) 80 MCG/ACT inhaler Inhale  1 puff into the lungs 2 (two) times daily. 1 each 2   desoximetasone (TOPICORT) 0.25 % cream Apply 1 Application topically 2 (two) times daily. 30 g 0   diclofenac Sodium (VOLTAREN) 1 % GEL Apply 2 g topically 4 (four) times daily. 100 g 1   losartan (COZAAR) 100 MG tablet TAKE 1 TABLET BY MOUTH DAILY 100 tablet 2   Multiple Vitamin (MULTIVITAMIN WITH MINERALS) TABS tablet Take 1 tablet by mouth daily. Vitafusion     pravastatin (PRAVACHOL) 80 MG tablet TAKE 1 TABLET BY MOUTH DAILY 100 tablet 1   psyllium (METAMUCIL) 58.6 % powder Take 1 packet by mouth daily as needed (constipation).     valACYclovir (VALTREX) 1000 MG tablet TAKE 1 TABLET BY MOUTH DAILY 100 tablet 1   COVID-19 mRNA vaccine 2023-2024 (COMIRNATY) syringe Inject into the muscle. 0.3 mL 0   phentermine 37.5 MG capsule Take 37.5 mg by mouth every morning.     No facility-administered medications prior to visit.    Allergies  Allergen Reactions   Sulfamethoxazole-Trimethoprim Itching   Sulfamethoxazole-Trimethoprim [Bactrim] Itching   Codeine Nausea And Vomiting and Anxiety    ROS See HPI    Objective:    Physical Exam Constitutional:      General: She is not in acute distress.    Appearance: Normal appearance. She is well-developed.  HENT:     Head: Normocephalic and atraumatic.     Right Ear: External ear normal.     Left Ear: External ear normal.  Eyes:     General: No scleral icterus. Neck:     Thyroid: No thyromegaly.  Cardiovascular:     Rate and Rhythm: Normal rate and regular rhythm.     Heart sounds: Normal heart  sounds. No murmur heard. Pulmonary:     Effort: Pulmonary effort is normal. No respiratory distress.     Breath sounds: Normal breath sounds. No wheezing.  Musculoskeletal:     Cervical back: Neck supple.  Skin:    General: Skin is warm and dry.  Neurological:     Mental Status: She is alert and oriented to person, place, and time.  Psychiatric:        Mood and Affect: Mood normal.        Behavior: Behavior normal.        Thought Content: Thought content normal.        Judgment: Judgment normal.     BP 126/66 (BP Location: Right Arm, Patient Position: Sitting, Cuff Size: Large)   Pulse 78   Temp 97.6 F (36.4 C) (Oral)   Resp 16   Wt 244 lb (110.7 kg)   LMP 11/10/2011   SpO2 99%   BMI 44.63 kg/m  Wt Readings from Last 3 Encounters:  07/06/22 244 lb (110.7 kg)  05/04/22 238 lb 11.2 oz (108.3 kg)  04/06/22 239 lb (108.4 kg)       Assessment & Plan:  Essential hypertension Assessment & Plan: Maintained on amlodipine and losartan. BP at goal.    Orders: -     Lipid panel  Uncomplicated asthma, unspecified asthma severity, unspecified whether persistent Assessment & Plan: Reports that she continues her inhaled steroid.  Rare need for albuterol.    Genital herpes simplex, unspecified site Assessment & Plan: Stable on valtrex. Continue same.    Type 2 diabetes mellitus without complication, without long-term current use of insulin [facility]) Assessment & Plan: Lab Results  Component Value Date   HGBA1C 6.2 04/06/2022   Last  A1C was at goal. She is interested in [facility] for glycemic control and weight management. Will see if her insurance will cover.   Orders: -     Mounjaro; Inject 2.5 mg into the skin once a week.  Dispense: 2 mL; Refill: 0  Controlled type 2 diabetes mellitus without complication, without long-term current use of insulin (HCC) Assessment & Plan: Lab Results  Component Value Date   HGBA1C 6.2 04/06/2022   Last A1C was at goal. She is  interested in [facility] for glycemic control and weight management. Will see if her insurance will cover.   Orders: -     Hemoglobin A1c  Vitamin D deficiency Assessment & Plan: Last vitamin D was normal- she continues bariatric vitamins.       I,Rachel Rivera,acting as a Education administrator for [name], NP.,have documented all relevant documentation on the behalf of [name], NP,as directed by  [name], NP while in the presence of [name], NP.   I, [name], NP, personally preformed the services described in this documentation.  All medical record entries made by the scribe were at my direction and in my presence.  I have reviewed the chart and discharge instructions (if applicable) and agree that the record reflects my personal performance and is accurate and complete. 07/06/22   [name], NP

## 2022-07-06 ENCOUNTER — Ambulatory Visit (INDEPENDENT_AMBULATORY_CARE_PROVIDER_SITE_OTHER): Payer: 59 | Admitting: Family

## 2022-07-06 ENCOUNTER — Other Ambulatory Visit (HOSPITAL_BASED_OUTPATIENT_CLINIC_OR_DEPARTMENT_OTHER): Payer: Self-pay

## 2022-07-06 VITALS — BP 126/66 | HR 78 | Temp 97.6°F | Resp 16 | Wt 244.0 lb

## 2022-07-06 DIAGNOSIS — J45909 Unspecified asthma, uncomplicated: Secondary | ICD-10-CM

## 2022-07-06 DIAGNOSIS — E559 Vitamin D deficiency, unspecified: Secondary | ICD-10-CM

## 2022-07-06 DIAGNOSIS — A6000 Herpesviral infection of urogenital system, unspecified: Secondary | ICD-10-CM

## 2022-07-06 DIAGNOSIS — E119 Type 2 diabetes mellitus without complications: Secondary | ICD-10-CM

## 2022-07-06 DIAGNOSIS — I10 Essential (primary) hypertension: Secondary | ICD-10-CM

## 2022-07-06 LAB — LIPID PANEL
Cholesterol: 175 mg/dL (ref 0–200)
HDL: 60.3 mg/dL (ref >39.00)
LDL Cholesterol: 102 mg/dL — ABNORMAL HIGH (ref 0–99)
NonHDL: 114.7
Total CHOL/HDL Ratio: 3
Triglycerides: 63 mg/dL (ref 0.0–149.0)
VLDL: 12.6 mg/dL (ref 0.0–40.0)

## 2022-07-06 LAB — HEMOGLOBIN A1C: Hgb A1c MFr Bld: 6 % (ref 4.6–6.5)

## 2022-07-06 MED ORDER — MOUNJARO 2.5 MG/0.5ML ~~LOC~~ SOAJ
2.5000 mg | SUBCUTANEOUS | 0 refills | Status: DC
  Filled 2022-07-06: qty 2, 28d supply, fill #0

## 2022-07-06 NOTE — Assessment & Plan Note (Signed)
Maintained on amlodipine and losartan. BP at goal.

## 2022-07-06 NOTE — Assessment & Plan Note (Signed)
Lab Results  Component Value Date   HGBA1C 6.2 04/06/2022   Last A1C was at goal. She is interested in [facility] for glycemic control and weight management. Will see if her insurance will cover.

## 2022-07-06 NOTE — Assessment & Plan Note (Signed)
Reports that she continues her inhaled steroid.  Rare need for albuterol.

## 2022-07-06 NOTE — Assessment & Plan Note (Signed)
Stable on valtrex. Continue same.

## 2022-07-06 NOTE — Assessment & Plan Note (Signed)
Last vitamin D was normal- she continues bariatric vitamins.

## 2022-07-12 ENCOUNTER — Other Ambulatory Visit: Payer: Self-pay | Admitting: Family

## 2022-07-19 ENCOUNTER — Telehealth: Payer: Self-pay | Admitting: Family

## 2022-07-19 NOTE — Telephone Encounter (Signed)
Patient reports doing well on medication and still has enough for 2 more weeks.  She will call again for refill or next dose next week

## 2022-07-19 NOTE — Telephone Encounter (Signed)
Prescription Request  07/19/2022  Is this a "Controlled Substance" medicine? No  LOV: 07/06/2022  What is the name of the medication or equipment?  tirzepatide [facility]) 2.5 MG/0.5ML Pen   Have you contacted your pharmacy to request a refill? Yes   Which pharmacy would you like this sent to?  [facility] [address], [place] [address] Phone: [phone] Fax: [phone]    Patient notified that their request is being sent to the clinical staff for review and that they should receive a response within 2 business days.   Please advise at [place]

## 2022-07-26 ENCOUNTER — Telehealth: Payer: Self-pay | Admitting: Family

## 2022-07-26 NOTE — Telephone Encounter (Signed)
Prescription Request  07/26/2022  Is this a "Controlled Substance" medicine? No  LOV: 07/06/2022  What is the name of the medication or equipment?   Rx #: [id number]  tirzepatide [facility]) 2.5 MG/0.5ML Pen [id number]   Have you contacted your pharmacy to request a refill? No   Which pharmacy would you like this sent to?   [facility] [place], [address] [place] [address] Phone: [phone] Fax: [phone]  Patient notified that their request is being sent to the clinical staff for review and that they should receive a response within 2 business days.   Please advise at [place] [phone] (mobile)

## 2022-07-26 NOTE — Telephone Encounter (Signed)
Pt called stating that the [facility] does not have the medication in stock and to route it to the following pharmacy instead:  [facility] [address], [place], [place] [address] Phone: [phone]  Fax: [phone]

## 2022-07-27 ENCOUNTER — Other Ambulatory Visit (HOSPITAL_BASED_OUTPATIENT_CLINIC_OR_DEPARTMENT_OTHER): Payer: Self-pay

## 2022-07-27 ENCOUNTER — Other Ambulatory Visit: Payer: Self-pay

## 2022-07-27 DIAGNOSIS — E119 Type 2 diabetes mellitus without complications: Secondary | ICD-10-CM

## 2022-07-27 MED ORDER — MOUNJARO 2.5 MG/0.5ML ~~LOC~~ SOAJ
2.5000 mg | SUBCUTANEOUS | 0 refills | Status: DC
  Filled 2022-07-27: qty 2, 28d supply, fill #0

## 2022-07-27 NOTE — Telephone Encounter (Signed)
Rx routed to [facility]

## 2022-08-02 ENCOUNTER — Other Ambulatory Visit: Payer: Self-pay | Admitting: Family

## 2022-08-18 ENCOUNTER — Telehealth: Payer: Self-pay | Admitting: *Deleted

## 2022-08-18 NOTE — Telephone Encounter (Signed)
Benefits verified via [employer].  

## 2022-08-18 NOTE — Telephone Encounter (Signed)
-----   Message from Elberta Spaniel sent at 08/17/2022 12:15 PM EDT ----- Regarding: Pt request Zilretta  Pt caled for Zilretta injection last on gvn 09/02/21--  Patient advised of Process and told we would give her a call once VOB & precertificiation requirement of her Ins is done-(then we have to order).  Please start process.  Thx

## 2022-08-20 NOTE — Telephone Encounter (Signed)
Pt informed of below.    Benefit summary from [employer] states patient owes 20% for Zilretta, CPT 20610 and OV with the remaining covered at 80% by plan. Deductibles do not apply. If OOP is met, coverage goes to 100%. Patient has Medicaid as secondary. Member's leftover by Medicare can be billed to Medicaid (if eligible). Member will be covered at 100%.  Zilretta order placed with [place] [facility].

## 2022-08-24 ENCOUNTER — Other Ambulatory Visit (HOSPITAL_BASED_OUTPATIENT_CLINIC_OR_DEPARTMENT_OTHER): Payer: Self-pay

## 2022-08-24 ENCOUNTER — Telehealth: Payer: Self-pay | Admitting: Family

## 2022-08-24 ENCOUNTER — Other Ambulatory Visit: Payer: Self-pay

## 2022-08-24 ENCOUNTER — Other Ambulatory Visit: Payer: Self-pay | Admitting: Family

## 2022-08-24 DIAGNOSIS — E119 Type 2 diabetes mellitus without complications: Secondary | ICD-10-CM

## 2022-08-24 MED ORDER — MOUNJARO 2.5 MG/0.5ML ~~LOC~~ SOAJ
2.5000 mg | SUBCUTANEOUS | 2 refills | Status: DC
  Filled 2022-08-24: qty 2, 28d supply, fill #0
  Filled 2022-09-22: qty 2, 28d supply, fill #1
  Filled 2022-10-19: qty 2, 28d supply, fill #2

## 2022-08-24 NOTE — Telephone Encounter (Signed)
Prescription Request  08/24/2022  Is this a "Controlled Substance" medicine? No  LOV: 07/06/2022  What is the name of the medication or equipment?  tirzepatide [facility]) 2.5 MG/0.5ML Pen   Have you contacted your pharmacy to request a refill? No   Which pharmacy would you like this sent to?   [facility] [address] [place] [address] Phone: [phone] Fax: [phone]    Patient notified that their request is being sent to the clinical staff for review and that they should receive a response within 2 business days.   Please advise at Mobile [phone] (mobile)

## 2022-08-25 ENCOUNTER — Ambulatory Visit (INDEPENDENT_AMBULATORY_CARE_PROVIDER_SITE_OTHER): Payer: 59 | Admitting: Family Medicine

## 2022-08-25 ENCOUNTER — Other Ambulatory Visit (HOSPITAL_BASED_OUTPATIENT_CLINIC_OR_DEPARTMENT_OTHER): Payer: Self-pay

## 2022-08-25 ENCOUNTER — Other Ambulatory Visit: Payer: Self-pay

## 2022-08-25 ENCOUNTER — Encounter: Payer: Self-pay | Admitting: Family Medicine

## 2022-08-25 VITALS — BP 128/62 | Ht 62.0 in | Wt 244.0 lb

## 2022-08-25 DIAGNOSIS — M1711 Unilateral primary osteoarthritis, right knee: Secondary | ICD-10-CM

## 2022-08-25 MED ORDER — TRIAMCINOLONE ACETONIDE 32 MG IX SRER
32.0000 mg | Freq: Once | INTRA_ARTICULAR | Status: AC
  Administered 2022-08-25: 32 mg via INTRA_ARTICULAR

## 2022-08-25 NOTE — Telephone Encounter (Signed)
Rx sent yesterday

## 2022-08-25 NOTE — Assessment & Plan Note (Signed)
Acute on chronic knee pain.  -Counseled on home exercise therapy and supportive care. -Completed Zilretta injection.

## 2022-08-25 NOTE — Patient Instructions (Signed)
Good to see you Please use ice as needed  Please send me a message in MyChart with any questions or updates.  Please see me back in 3 months or as needed if better.   --Dr. Jordan Likes

## 2022-08-25 NOTE — Addendum Note (Signed)
Addended by: Merrilyn Puma on: 08/25/2022 11:55 AM   Modules accepted: Orders

## 2022-08-25 NOTE — Progress Notes (Signed)
Leah Hampton - 67 y.o. female MRN [id number]  Date of birth: 10/31/1955  SUBJECTIVE:  Including CC & ROS.  No chief complaint on file.   Leah Hampton is a 67 y.o. female that is  here for zilretta injection.    Review of Systems See HPI   HISTORY: Past Medical, Surgical, Social, and Family History Reviewed & Updated per EMR.   Pertinent Historical Findings include:  Past Medical History:  Diagnosis Date   Bronchitis    Chronic bronchitis    Cystitis    Diabetes type 2, controlled    Hematuria    urologice eval, Dr Lindley Magnus   History of chest pain    Hyperbilirubinemia    Hyperlipemia    Hypertelorism    Hypertension    Leukocytosis    Obesity    Osteoarthritis    right knee   Sleep apnea 2012   slight    Vaginal Pap smear, abnormal    Vitamin D deficiency 02/09/2018    Past Surgical History:  Procedure Laterality Date   CERVICAL POLYPECTOMY  07/15/2020   Procedure: POLYPECTOMY with Myosure;  Surgeon: Willodean Rosenthal, MD;  [facility];  Service: Gynecology;;   COLONOSCOPY WITH PROPOFOL N/A 03/27/2015   Procedure: COLONOSCOPY WITH PROPOFOL;  Surgeon: Rachael Fee, MD;  [facility];  Service: Endoscopy;  Laterality: N/A;   COLONOSCOPY WITH PROPOFOL N/A 10/02/2020   Procedure: COLONOSCOPY WITH PROPOFOL;  Surgeon: Rachael Fee, MD;  [facility];  Service: Endoscopy;  Laterality: N/A;   COLPOSCOPY N/A 07/15/2020   Procedure: COLPOSCOPY;  Surgeon: Willodean Rosenthal, MD;  [facility];  Service: Gynecology;  Laterality: N/A;   DILATATION & CURETTAGE/HYSTEROSCOPY WITH MYOSURE N/A 01/05/2017   Procedure: DILATATION & CURETTAGE/HYSTEROSCOPY WITH MYOSURE;  Surgeon: Geryl Rankins, MD;  Location: [facility];  Service: Gynecology;  Laterality: N/A;  PostMenopausal Bleeding   HYSTEROSCOPY WITH D & C N/A 05/22/2019   Procedure: DILATATION AND CURETTAGE /HYSTEROSCOPY, POLYPECTOMY WITH MYOSURE;  Surgeon: Willodean Rosenthal, MD;   [facility];  Service: Gynecology;  Laterality: N/A;   HYSTEROSCOPY WITH D & C N/A 07/15/2020   Procedure: DILATATION AND CURETTAGE /HYSTEROSCOPY;  Surgeon: Willodean Rosenthal, MD;  Location[facility];  Service: Gynecology;  Laterality: N/A;   KNEE ARTHROSCOPY  04/2007   left knee   lap sleeve     TOTAL KNEE ARTHROPLASTY Left 10/2008   Dr Irine Seal   UPPER GI ENDOSCOPY N/A 10/14/2020   Procedure: UPPER GI ENDOSCOPY;  Surgeon: Gaynelle Adu, MD;  [facility];  Service: General;  Laterality: N/A;     PHYSICAL EXAM:  VS: BP 128/62 (BP Location: Right Arm, Patient Position: Sitting)   Ht 5\' 2"  (1.575 m)   Wt 244 lb (110.7 kg)   LMP 11/10/2011   BMI 44.63 kg/m  Physical Exam Gen: NAD, alert, cooperative with exam, well-appearing MSK:  Neurovascularly intact     Aspiration/Injection Procedure Note Leah Hampton March 08, 1956  Procedure: Injection Indications: right knee pain  Procedure Details Consent: Risks of procedure as well as the alternatives and risks of each were explained to the (patient/caregiver).  Consent for procedure obtained. Time Out: Verified patient identification, verified procedure, site/side was marked, verified correct patient position, special equipment/implants available, medications/allergies/relevent history reviewed, required imaging and test results available.  Performed.  The area was cleaned with iodine and alcohol swabs.    The right knee superior lateral suprapatellar pouch was injected using 4 cc of 1% lidocaine and 0.4 cc  of 8.4% sodium bicarbonate on a 22-gauge 1-1/2 inch needle.  An 18-gauge 1-1/2 needle was used to achieve aspiration.  The syringe was switched and a mixture containing 5 cc's of 32 mg Zilretta and 4 cc's of 0.25% bupivacaine was injected.  Ultrasound was used. Images were obtained in long views showing the injection.    A sterile dressing was applied.  Patient did tolerate procedure well.       ASSESSMENT & PLAN:    Osteoarthritis of right knee Acute on chronic knee pain.  -Counseled on home exercise therapy and supportive care. -Completed Zilretta injection.

## 2022-08-26 ENCOUNTER — Other Ambulatory Visit: Payer: Self-pay | Admitting: Family

## 2022-08-30 ENCOUNTER — Encounter: Payer: Self-pay | Admitting: *Deleted

## 2022-09-08 ENCOUNTER — Ambulatory Visit (INDEPENDENT_AMBULATORY_CARE_PROVIDER_SITE_OTHER): Payer: 59 | Admitting: Obstetrics & Gynecology

## 2022-09-08 ENCOUNTER — Other Ambulatory Visit (HOSPITAL_COMMUNITY)
Admission: RE | Admit: 2022-09-08 | Discharge: 2022-09-08 | Disposition: A | Discharge status: Home / Self Care | Payer: 59 | Source: Ambulatory Visit | Attending: Obstetrics & Gynecology | Admitting: Obstetrics & Gynecology

## 2022-09-08 ENCOUNTER — Encounter: Payer: Self-pay | Admitting: Obstetrics & Gynecology

## 2022-09-08 VITALS — BP 105/58 | HR 75 | Ht 62.0 in | Wt 234.0 lb

## 2022-09-08 DIAGNOSIS — R87810 Cervical high risk human papillomavirus (HPV) DNA test positive: Secondary | ICD-10-CM | POA: Insufficient documentation

## 2022-09-08 DIAGNOSIS — Z1151 Encounter for screening for human papillomavirus (HPV): Secondary | ICD-10-CM | POA: Diagnosis not present

## 2022-09-08 DIAGNOSIS — Z1231 Encounter for screening mammogram for malignant neoplasm of breast: Secondary | ICD-10-CM

## 2022-09-08 DIAGNOSIS — Z8742 Personal history of other diseases of the female genital tract: Secondary | ICD-10-CM | POA: Insufficient documentation

## 2022-09-08 DIAGNOSIS — Z01411 Encounter for gynecological examination (general) (routine) with abnormal findings: Secondary | ICD-10-CM | POA: Insufficient documentation

## 2022-09-08 DIAGNOSIS — Z01419 Encounter for gynecological examination (general) (routine) without abnormal findings: Secondary | ICD-10-CM

## 2022-09-08 DIAGNOSIS — R87612 Low grade squamous intraepithelial lesion on cytologic smear of cervix (LGSIL): Secondary | ICD-10-CM | POA: Insufficient documentation

## 2022-09-08 NOTE — Progress Notes (Signed)
Subjective:     Leah Hampton is a 67 y.o. female here for a routine exam.  Current complaints: none. Pt reports occ brown staining. She reports that she does not have frank bleeding. This is not like the sx she had prev. She is s/p hysterosocpy with D&C in 3/22. Benign polyp noted.     Gynecologic History Patient's last menstrual period was 11/10/2011. Contraception: post menopausal status Last Pap: 09/02/2021. Results were: normal (prev h/o LGSIL) Last mammogram: 12/29/2021. Results were: normal  Obstetric History OB History  Gravida Para Term Preterm AB Living  SAB IAB Ectopic Multiple Live Births          2    # Outcome Date GA Lbr Len/2nd Weight Sex Delivery Anes PTL Lv  2 Term      Vag-Spont     1 Term      Vag-Spont        The following portions of the patient's history were reviewed and updated as appropriate: allergies, current medications, past family history, past medical history, past social history, past surgical history, and problem list.  Review of Systems Pertinent items are noted in HPI.    Objective:  BP (!) 105/58   Pulse 75   Ht  (1.575 m)   Wt 234 lb (106.1 kg)   LMP 11/10/2011   BMI 42.80 kg/m   General Appearance:    Alert, cooperative, no distress, appears stated age  Head:    Normocephalic, without obvious abnormality, atraumatic  Eyes:    conjunctiva/corneas clear, EOM's intact, both eyes  Ears:    Normal external ear canals, both ears  Nose:   Nares normal, septum midline, mucosa normal, no drainage    or sinus tenderness  Throat:   Lips, mucosa, and tongue normal; teeth and gums normal  Neck:   Supple, symmetrical, trachea midline, no adenopathy;    thyroid:  no enlargement/tenderness/nodules  Back:     Symmetric, no curvature, ROM normal, no CVA tenderness  Lungs:     respirations unlabored  Chest Wall:    No tenderness or deformity   Heart:    Regular rate and rhythm  Breast Exam:    No tenderness, masses, or nipple  abnormality  Abdomen:     Soft, non-tender, bowel sounds active all four quadrants,    no masses, no organomegaly  Genitalia:    Normal female without lesion, discharge or tenderness   No blood in vault. No lesions on cervix.   Extremities:   Extremities normal, atraumatic, no cyanosis or edema  Pulses:   2+ and symmetric all extremities  Skin:   Skin color, texture, turgor normal, no rashes or lesions     Assessment:    Healthy female exam.     Plan:  ,Diagnoses and all orders for this visit:  Well female exam with routine gynecological exam  History of abnormal cervical Pap smear -     Cytology - PAP( [facility])  Breast cancer screening by mammogram -     MM 3D SCREENING MAMMOGRAM BILATERAL BREAST; Future   F/u PAP results Pt to call if vaginal; bleeding noted.  F/u mammogram in Aug 2024.  F/u in 1 year or sooner prn   Rasha Ibe L. Harraway-Smith, M.D., [place]

## 2022-09-14 LAB — CYTOLOGY - PAP
Comment: NEGATIVE
Comment: NEGATIVE
Comment: NEGATIVE
HPV 16: NEGATIVE
HPV 18 / 45: POSITIVE — AB
High risk HPV: POSITIVE — AB

## 2022-09-15 ENCOUNTER — Telehealth: Payer: Self-pay

## 2022-09-15 NOTE — Telephone Encounter (Signed)
Patient called and made aware of her pap smear. Explained that she will need a colposcopy. Patient schedule for colpo. Armandina Stammer RN

## 2022-09-15 NOTE — Telephone Encounter (Signed)
-----   Message from Willodean Rosenthal, MD sent at 09/15/2022  3:24 PM EDT ----- Please call pt. She has an abnormal PAP and needs a colpo.   Thx  Cl[facility]-S

## 2022-10-03 ENCOUNTER — Other Ambulatory Visit: Payer: Self-pay | Admitting: Family

## 2022-10-05 ENCOUNTER — Encounter: Payer: Self-pay | Admitting: Skilled Nursing Facility1

## 2022-10-05 ENCOUNTER — Encounter: Payer: 59 | Attending: Family | Admitting: Skilled Nursing Facility1

## 2022-10-05 VITALS — Ht 64.0 in | Wt 230.1 lb

## 2022-10-05 DIAGNOSIS — E669 Obesity, unspecified: Secondary | ICD-10-CM | POA: Insufficient documentation

## 2022-10-05 DIAGNOSIS — E119 Type 2 diabetes mellitus without complications: Secondary | ICD-10-CM | POA: Diagnosis not present

## 2022-10-05 NOTE — Progress Notes (Signed)
Bariatric Nutrition Follow-Up Visit Medical Nutrition Therapy   Post-Operative sleeve Surgery Surgery Date: 10/14/2020  NUTRITION ASSESSMENT  Anthropometrics  Start weight at [facility]: 312 lbs (date: 02/20/2020) Height: 62 in Today's weight: 230.1 lbs  Clinical  Medical hx: OSA, DM, HTN Medications:  see list, omeprazole, no longer taking phentermine, monjauro Labs: A1C 6.0   Lifestyle & Dietary Hx  Pt states she started monjauro.  Pt states she wants to be 160 pounds stating she should increase her activity and drink more water.  Pt arrives without the use of her cane.   Body Composition Scale 10/05/2022  Current Body Weight 230.1  Total Body Fat % 45.8  Visceral Fat 18  Fat-Free Mass % 54.1   Total Body Water % 41.5  Muscle-Mass lbs 28.7  BMI 39.3  Body Fat Displacement          Torso  lbs 65.2         Left Leg  lbs 13         Right Leg  lbs 13         Left Arm  lbs 6.5         Right Arm   lbs 6.5     Estimated daily fluid intake: unknown maybe 50 oz Estimated daily protein intake: 60+ g Supplements: multi (approved) and calcium Current average weekly physical activity: treadmill 30-40 minutes 4-5 days a week walking on the treadmill  24-Hr Dietary Recall First Meal: egg and cheese or oatmeal or omelet with onions and ham Snack:  Second Meal 12:30: crab cake on a bun Snack:   Third Meal: fried chicken wings and salad Snack:  Beverages: water, water + flavorings, coffee, Hi[employer]'s Bright & Early, soda   Post-Op Goals/ Signs/ Symptoms Using straws: no Drinking while eating: no Chewing/swallowing difficulties: no Changes in vision: no Changes to mood/headaches: no Hair loss/changes to skin/nails: no Difficulty focusing/concentrating: no Sweating: no Dizziness/lightheadedness: no Palpitations: no  Carbonated/caffeinated beverages: no N/V/D/C/Gas: using metamucil daily, bowel movement daily Abdominal pain: no Dumping syndrome: no    NUTRITION DIAGNOSIS   Overweight/obesity ([place]-3.3) related to past poor dietary habits and physical inactivity as evidenced by completed bariatric surgery and following dietary guidelines for continued weight loss and healthy nutrition status.     NUTRITION INTERVENTION: continued Nutrition counseling (C-1) and education (E-2) to facilitate bariatric surgery goals, including: Creation of balanced and diverse meals to increase the intake of nutrient-rich foods that provide essential vitamins, minerals, fiber, and phytonutrients  Variety of Fruits and Vegetables:  Aim for a colorful array of fruits and vegetables to ensure a wide range of nutrients. Include a mix of leafy greens, berries, citrus fruits, cruciferous vegetables, and more. Whole Grains: Choose whole grains over refined grains. Examples include brown rice, quinoa, oats, whole wheat, and barley. Lean Proteins: Include lean sources of protein, such as poultry, fish, tofu, legumes, beans, lentils, and low-fat dairy products. Limit red and processed meats. Healthy Fats: Incorporate sources of healthy fats, including avocados, nuts, seeds, and olive oil. Limit saturated and trans fats found in fried and processed foods. Dairy or Dairy Alternatives: Choose low-fat or fat-free dairy products, or plant-based alternatives like almond or soy milk. Portion Control: Be mindful of portion sizes to avoid overeating. Pay attention to hunger and satisfaction cues. Limit Added Sugars: Minimize the consumption of sugary beverages, snacks, and desserts. Check food labels for added sugars and opt for natural sources of sweetness such as whole fruits. Hydration: Drink plenty of water throughout  the day. Limit sugary drinks and excessive caffeine intake. Moderate Sodium Intake: Reduce the consumption of high-sodium foods. Use herbs and spices for flavor instead of excessive salt. Meal Planning and Preparation: Plan and prepare meals ahead of time to make healthier  choices more convenient. Include a mix of food groups in each meal. Limit Processed Foods: Minimize the intake of highly processed and packaged foods that are often high in added sugars, salt, and unhealthy fats. Regular Physical Activity: Combine a healthy diet with regular physical activity for overall well-being. Aim for at least 150 minutes of moderate-intensity aerobic exercise per week, along with strength training. Moderation and Balance: Enjoy treats and indulgent foods in moderation, emphasizing balance rather than strict restriction.   Goals: -Continue: ensure you have non starchy vegetables 2 times a day 7 days a week -Continue: include resistance activities 2-3 times a week as appropriate with hernia -Continue: ensure you are drinking at least 32 ounces of plain water -continue: avoid sugar sweetened beverages (avoid HiC's "Bright & Early") -continue: look for whole wheat/grain options for bread and buns -continue: check nutrition facts for your metamucil, make sure it does not have sugar. Use your Miralax instead -NEW: mark a piece of paper beside the couch when you have a snack: looking to only 2 marks by the end of the day   Handouts Previously Provided Include  Bariatric MyPlate  Learning Style & Readiness for Change Teaching method utilized: Visual & Auditory  Demonstrated degree of understanding via: Teach Back  Readiness Level: action Barriers to learning/adherence to lifestyle change: none identified   RD's Notes for Next Visit Assess adherence to pt chosen goals    MONITORING & EVALUATION Dietary intake, weekly physical activity, body weight  Next Steps Patient is to follow-up in 3 months

## 2022-10-11 NOTE — Progress Notes (Unsigned)
Cardiology Office Note:    Date:  10/12/2022   ID:  [name], [name] [birth year], MRN [id number]  PCP:  Sandford Craze, NP  Cardiologist:  Little Ishikawa, MD  Electrophysiologist:  None   Referring MD: Sandford Craze, NP   Chief Complaint  Patient presents with   Hypertension     History of Present Illness:    Leah Hampton is a 67 y.o. female with a hx of T2DM, hypertension, hyperlipidemia, OSA, morbid obesity who presents for follow-up.  She was referred by Dr. Andrey Campanile for preop evaluation prior to bariatric surgery, initially seen on 02/26/20.  She denied any chest pain but reported occasional dyspnea on exertion.  States that she has to walk up a flight of stairs in her home multiple times per day (12 steps).  No smoking history.  Father died of MI in 65s.  Echocardiogram on 03/18/2020 showed normal biventricular function, grade 1 diastolic dysfunction, no significant valvular disease.  Lexiscan Myoview on 08/28/2020 showed normal perfusion, EF 64%.  Calcium score on 08/27/2020 was 0.  Since last clinic visit, she reports that she is doing well.  Had bariatric surgery 09/2020, has lost 85 pounds.  Denies any chest pain, dyspnea,  lower extremity edema.  Reports some lightheadedness but denies any syncope.  Reports some palpitations when she drinks coffee.  She walks on treadmill for 45 minutes 5 days/week, denies any exertional symptoms.   Wt Readings from Last 3 Encounters:  10/12/22 234 lb 6.4 oz (106.3 kg)  10/05/22 230 lb 1.6 oz (104.4 kg)  09/08/22 234 lb (106.1 kg)      Past Medical History:  Diagnosis Date   Bronchitis    Chronic bronchitis (HCC)    Cystitis    Diabetes type 2, controlled (HCC)    Hematuria    urologice eval, Dr Lindley Magnus   History of chest pain    Hyperbilirubinemia    Hyperlipemia    Hypertelorism    Hypertension    Leukocytosis    Obesity    Osteoarthritis    right knee   Sleep apnea 2012   slight    Vaginal Pap  smear, abnormal    Vitamin D deficiency 02/09/2018    Past Surgical History:  Procedure Laterality Date   CERVICAL POLYPECTOMY  07/15/2020   Procedure: POLYPECTOMY with Myosure;  Surgeon: Willodean Rosenthal, MD;  Location: [facility] OR;  Service: Gynecology;;   COLONOSCOPY WITH PROPOFOL N/A 03/27/2015   Procedure: COLONOSCOPY WITH PROPOFOL;  Surgeon: Rachael Fee, MD;  Location: [facility];  Service: Endoscopy;  Laterality: N/A;   COLONOSCOPY WITH PROPOFOL N/A 10/02/2020   Procedure: COLONOSCOPY WITH PROPOFOL;  Surgeon: Rachael Fee, MD;  Location: [facility];  Service: Endoscopy;  Laterality: N/A;   COLPOSCOPY N/A 07/15/2020   Procedure: COLPOSCOPY;  Surgeon: Willodean Rosenthal, MD;  Location: [facility] OR;  Service: Gynecology;  Laterality: N/A;   DILATATION & CURETTAGE/HYSTEROSCOPY WITH MYOSURE N/A 01/05/2017   Procedure: DILATATION & CURETTAGE/HYSTEROSCOPY WITH MYOSURE;  Surgeon: Geryl Rankins, MD;  Location: [facility] ORS;  Service: Gynecology;  Laterality: N/A;  PostMenopausal Bleeding   HYSTEROSCOPY WITH D & C N/A 05/22/2019   Procedure: DILATATION AND CURETTAGE /HYSTEROSCOPY, POLYPECTOMY WITH MYOSURE;  Surgeon: Willodean Rosenthal, MD;  Location: [facility];  Service: Gynecology;  Laterality: N/A;   HYSTEROSCOPY WITH D & C N/A 07/15/2020   Procedure: DILATATION AND CURETTAGE /HYSTEROSCOPY;  Surgeon: Willodean Rosenthal, MD;  Location: [facility] OR;  Service: Gynecology;  Laterality: N/A;   KNEE ARTHROSCOPY  04/2007   left knee   lap sleeve     TOTAL KNEE ARTHROPLASTY Left 10/2008   Dr Irine Seal   UPPER GI ENDOSCOPY N/A 10/14/2020   Procedure: UPPER GI ENDOSCOPY;  Surgeon: Gaynelle Adu, MD;  Location: [facility];  Service: General;  Laterality: N/A;    Current Medications: Current Meds  Medication Sig   albuterol (VENTOLIN HFA) 108 (90 Base) MCG/ACT inhaler USE 2 INHALATIONS BY MOUTH EVERY 6 HOURS AS NEEDED FOR WHEEZING  OR SHORTNESS OF BREATH   amLODipine (NORVASC) 5 MG tablet TAKE 1  TABLET BY MOUTH DAILY   desoximetasone (TOPICORT) 0.25 % cream Apply 1 Application topically 2 (two) times daily.   diclofenac Sodium (VOLTAREN) 1 % GEL Apply 2 g topically 4 (four) times daily.   losartan (COZAAR) 100 MG tablet TAKE 1 TABLET BY MOUTH DAILY   Multiple Vitamin (MULTIVITAMIN WITH MINERALS) TABS tablet Take 1 tablet by mouth daily. Vitafusion   omeprazole (PRILOSEC) 20 MG capsule TAKE 1 CAPSULE BY MOUTH DAILY   pravastatin (PRAVACHOL) 80 MG tablet TAKE 1 TABLET BY MOUTH DAILY   psyllium (METAMUCIL) 58.6 % powder Take 1 packet by mouth daily as needed (constipation).   QVAR REDIHALER 80 MCG/ACT inhaler USE 1 INHALATION BY MOUTH INTO  THE LUNGS TWICE DAILY   tirzepatide (MOUNJARO) 2.5 MG/0.5ML Pen Inject 2.5 mg into the skin once a week.   valACYclovir (VALTREX) 1000 MG tablet TAKE 1 TABLET BY MOUTH DAILY     Allergies:   Sulfamethoxazole-trimethoprim, Sulfamethoxazole-trimethoprim [bactrim], and Codeine   Social History   Socioeconomic History   Marital status: Single    Spouse name: Not on file   Number of children: 2   Years of education: Not on file   Highest education level: Not on file  Occupational History   Occupation: disabled  Tobacco Use   Smoking status: Never   Smokeless tobacco: Never  Vaping Use   Vaping Use: Never used  Substance and Sexual Activity   Alcohol use: No   Drug use: No   Sexual activity: Not Currently  Other Topics Concern   Not on file  Social History Narrative   Regular exercise:  No   Lives alone   Has 2 grown sons, both local.  1 grandson   On disability due to her knee pain   Completed 12th grade   Enjoys television   No pets.    Social Determinants of Health   Financial Resource Strain: Low Risk  (11/18/2020)   Overall Financial Resource Strain (CARDIA)    Difficulty of Paying Living Expenses: Not hard at all  Food Insecurity: No Food Insecurity (02/20/2020)   Hunger Vital Sign    Worried About Running Out of Food in the  Last Year: Never true    Ran Out of Food in the Last Year: Never true  Transportation Needs: No Transportation Needs (11/18/2020)   PRAPARE - Administrator, Civil Service (Medical): No    Lack of Transportation (Non-Medical): No  Physical Activity: Insufficiently Active (11/18/2020)   Exercise Vital Sign    Days of Exercise per Week: 1 day    Minutes of Exercise per Session: 20 min  Stress: No Stress Concern Present (11/18/2020)   [employer] of Occupational Health - Occupational Stress Questionnaire    Feeling of Stress : Not at all  Social Connections: Moderately Isolated (11/18/2020)   Social Connection and Isolation Panel [NHANES]    Frequency of Communication with Friends and Family: More than  three times a week    Frequency of Social Gatherings with Friends and Family: Once a week    Attends Religious Services: 1 to 4 times per year    Active Member of [employer] or Organizations: No    Attends Engineer, structural: Never    Marital Status: Never married     Family History: The patient's family history includes Alcohol abuse in her brother; Arthritis in her mother and another family member; CAD (age of onset: 49) in her sister; CVA in her sister; Cirrhosis in her brother; Coronary artery disease in an other family member; Diabetes in an other family member; Diabetes Mellitus II in her mother and sister; Heart attack (age of onset: 21) in her father; Heart failure in her mother; Hypertension in her mother and sister; Stroke in her mother and another family member. There is no history of Colon cancer.  ROS:   Please see the history of present illness.     All other systems reviewed and are negative.  EKGs/Labs/Other Studies Reviewed:    The following studies were reviewed today:   EKG:   10/12/2022: Normal sinus rhythm, rate 92, no ST abnormalities.    Recent Labs: 12/04/2021: ALT 22; Hemoglobin 12.5; Platelets 258.0 04/06/2022: BUN 18; Creatinine, Ser 0.78;  Potassium 3.9; Sodium 142  Recent Lipid Panel    Component Value Date/Time   CHOL 175 07/06/2022 1121   CHOL 171 05/23/2017 1142   TRIG 63.0 07/06/2022 1121   HDL 60.30 07/06/2022 1121   HDL 55 05/23/2017 1142   CHOLHDL 3 07/06/2022 1121   VLDL 12.6 07/06/2022 1121   LDLCALC 102 (H) 07/06/2022 1121   LDLCALC 104 (H) 05/23/2017 1142    Physical Exam:    VS:  BP 132/64   Pulse 92   Ht 5\' 3"  (1.6 m)   Wt 234 lb 6.4 oz (106.3 kg)   LMP 11/10/2011   SpO2 96%   BMI 41.52 kg/m     Wt Readings from Last 3 Encounters:  10/12/22 234 lb 6.4 oz (106.3 kg)  10/05/22 230 lb 1.6 oz (104.4 kg)  09/08/22 234 lb (106.1 kg)     GEN:  in no acute distress HEENT: Normal NECK: No JVD; No carotid bruits CARDIAC: RRR, no murmurs, rubs, gallops RESPIRATORY:  Clear to auscultation without rales, wheezing or rhonchi  ABDOMEN: Soft, non-tender, non-distended MUSCULOSKELETAL: Trace lower extremity edema SKIN: Warm and dry NEUROLOGIC:  Alert and oriented x 3 PSYCHIATRIC:  Normal affect   ASSESSMENT:    1. Chest pain of uncertain etiology   2. Essential hypertension   3. Morbid obesity (HCC)   4. Hyperlipidemia, unspecified hyperlipidemia type      PLAN:    Chest pain: Atypical in description but does have CAD risk factors (age, hypertension, hyperlipidemia, T2DM).   Echocardiogram on 03/18/2020 showed normal biventricular function, grade 1 diastolic dysfunction, no significant valvular disease.  Lexiscan Myoview on 08/28/2020 showed normal perfusion, EF 64%.  Calcium score on 08/27/2020 was 0.  Lower extremity edema: Mild, suspect related to obesity.  Echocardiogram unremarkable as above  Hypertension: On losartan 100mg  daily and amlodipine 5mg  daily.  Appears controlled  Hyperlipidemia: On pravastatin 80 mg daily.  LDL 102 on 07/06/2022.  Calcium score on 08/27/2020 was 0.  T2DM: A1c 6.0 on 07/06/22.  On Mounjaro  Morbid obesity:Body mass index is 41.52 kg/m.  Status post bariatric  surgery on 10/14/2020, has lost 85 lbs  RTC in 1 year     Medication  Adjustments/Labs and Tests Ordered: Current medicines are reviewed at length with the patient today.  Concerns regarding medicines are outlined above.  Orders Placed This Encounter  Procedures   EKG 12-Lead    No orders of the defined types were placed in this encounter.    Patient Instructions  Medication Instructions:  Your physician recommends that you continue on your current medications as directed. Please refer to the Current Medication list given to you today.   *If you need a refill on your cardiac medications before your next appointment, please call your pharmacy*  Follow-Up: At [facility], you and your health needs are our priority.  As part of our continuing mission to provide you with exceptional heart care, we have created designated Provider Care Teams.  These Care Teams include your primary Cardiologist (physician) and Advanced Practice Providers (APPs -  Physician Assistants and Nurse Practitioners) who all work together to provide you with the care you need, when you need it.  We recommend signing up for the patient portal called "MyChart".  Sign up information is provided on this After Visit Summary.  MyChart is used to connect with patients for Virtual Visits (Telemedicine).  Patients are able to view lab/test results, encounter notes, upcoming appointments, etc.  Non-urgent messages can be sent to your provider as well.   To learn more about what you can do with MyChart, go to [email or web address].    Your next appointment:   12 month(s)  Provider:   Little Ishikawa, MD        Signed, Little Ishikawa, MD  10/12/2022 10:12 AM     [facility]

## 2022-10-12 ENCOUNTER — Ambulatory Visit: Payer: 59 | Attending: Cardiology | Admitting: Cardiology

## 2022-10-12 ENCOUNTER — Encounter: Payer: Self-pay | Admitting: Cardiology

## 2022-10-12 VITALS — BP 132/64 | HR 92 | Ht 63.0 in | Wt 234.4 lb

## 2022-10-12 DIAGNOSIS — R079 Chest pain, unspecified: Secondary | ICD-10-CM | POA: Diagnosis not present

## 2022-10-12 DIAGNOSIS — I10 Essential (primary) hypertension: Secondary | ICD-10-CM

## 2022-10-12 DIAGNOSIS — E785 Hyperlipidemia, unspecified: Secondary | ICD-10-CM

## 2022-10-12 NOTE — Patient Instructions (Signed)
Medication Instructions:  Your physician recommends that you continue on your current medications as directed. Please refer to the Current Medication list given to you today.  *If you need a refill on your cardiac medications before your next appointment, please call your pharmacy*  Follow-Up: At [facility], you and your health needs are our priority.  As part of our continuing mission to provide you with exceptional heart care, we have created designated Provider Care Teams.  These Care Teams include your primary Cardiologist (physician) and Advanced Practice Providers (APPs -  Physician Assistants and Nurse Practitioners) who all work together to provide you with the care you need, when you need it.  We recommend signing up for the patient portal called "MyChart".  Sign up information is provided on this After Visit Summary.  MyChart is used to connect with patients for Virtual Visits (Telemedicine).  Patients are able to view lab/test results, encounter notes, upcoming appointments, etc.  Non-urgent messages can be sent to your provider as well.   To learn more about what you can do with MyChart, go to [email or web address].    Your next appointment:   12 month(s)  Provider:   Christopher L Schumann, MD      

## 2022-10-19 IMAGING — US US PELVIS COMPLETE WITH TRANSVAGINAL
1 series · 13 of 25 positions shown · non-contrast
Comparison: 05/08/2019

CLINICAL DATA: Postmenopausal bleeding



[Series 1: us pelvis complete with transvaginal · 13 of 39 slices shown]
[im 1/39]
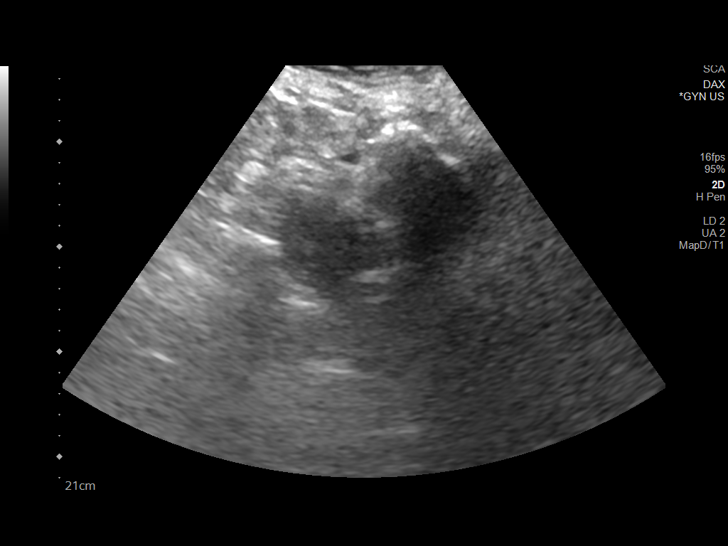
[im 4/39]
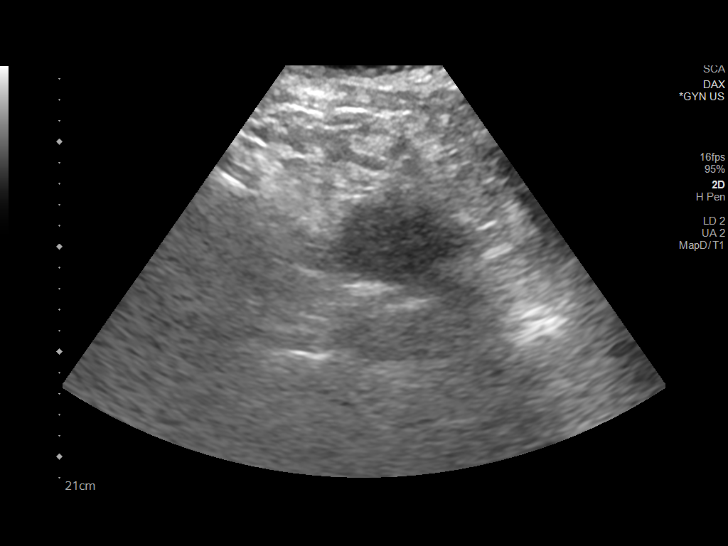
[im 7/39]
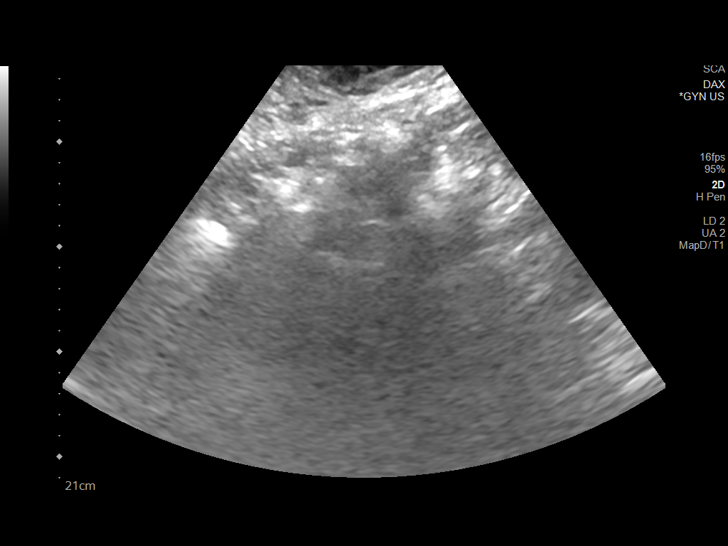
[im 10/39]
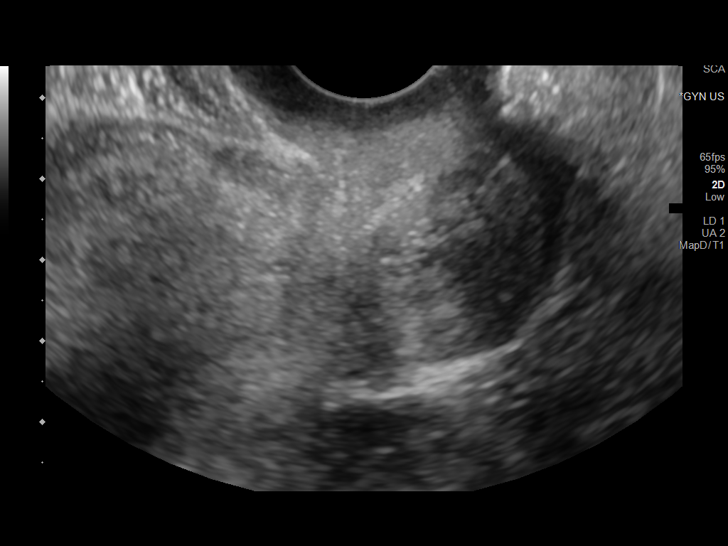
[im 13/39]
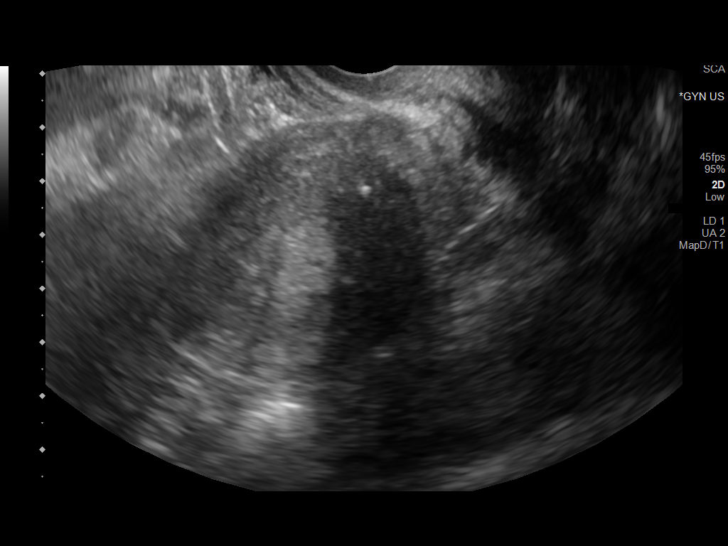
[im 16/39]
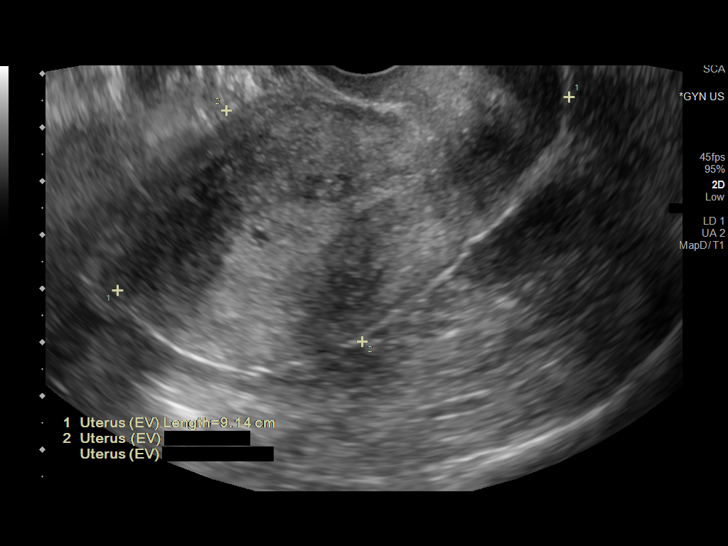
[im 20/39]
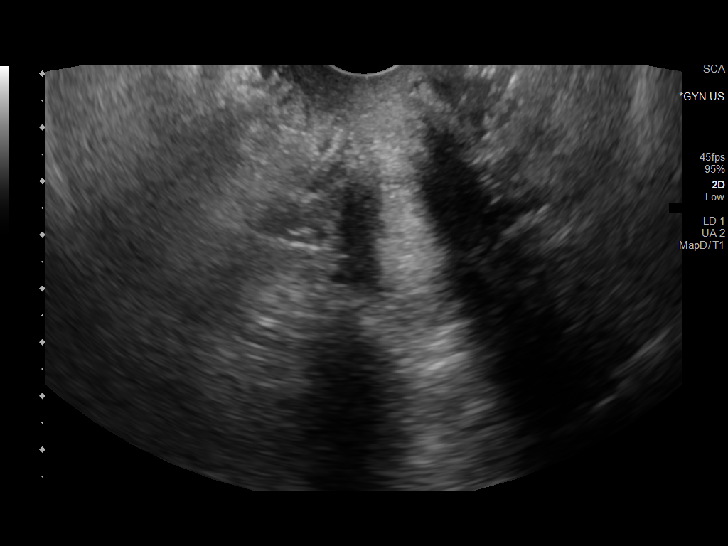
[im 23/39]
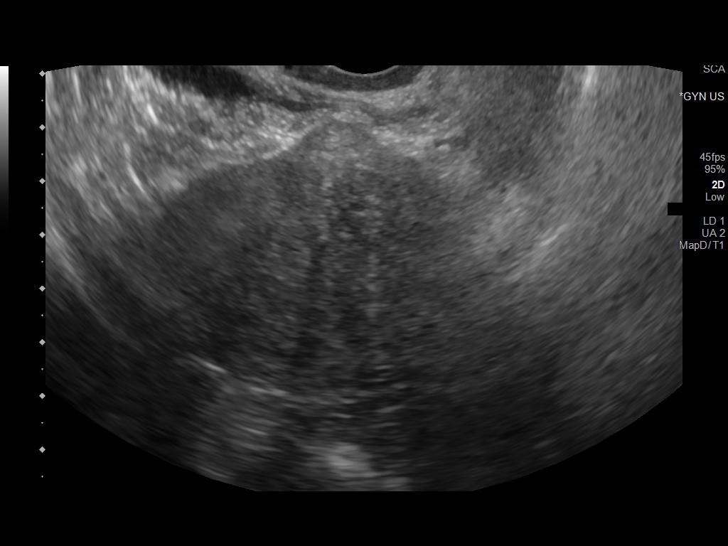
[im 26/39]
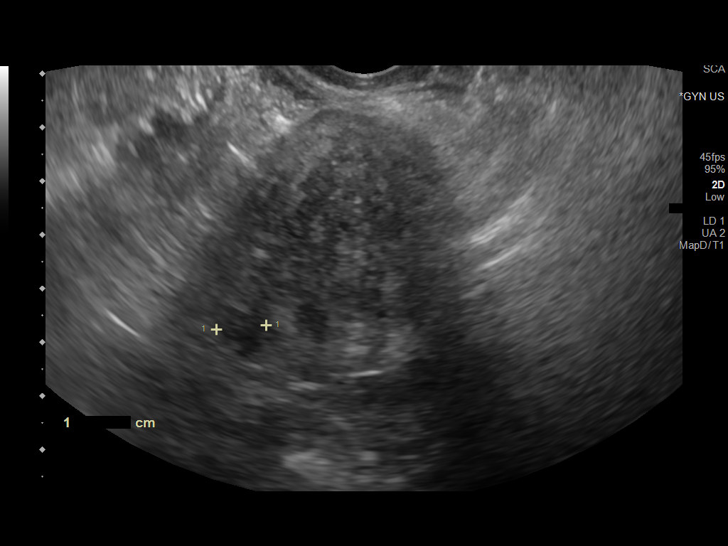
[im 29/39]
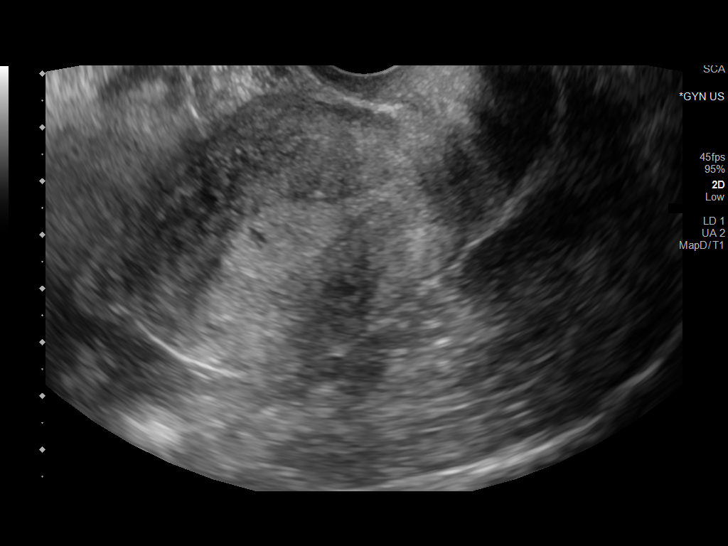
[im 32/39]
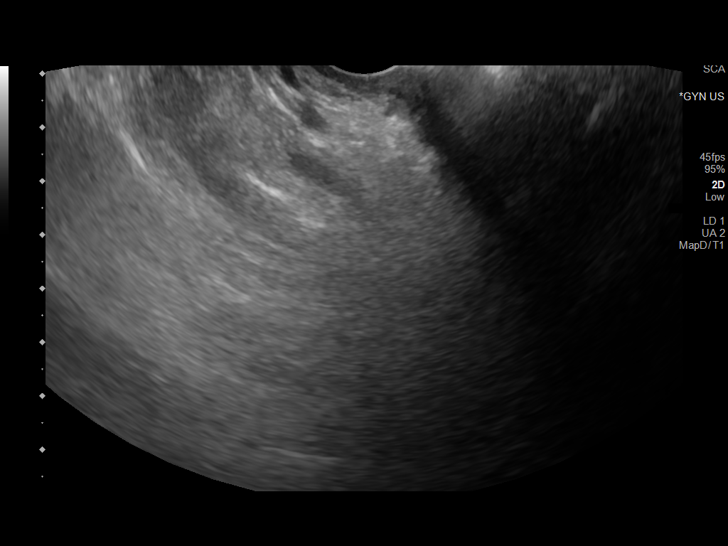
[im 35/39]
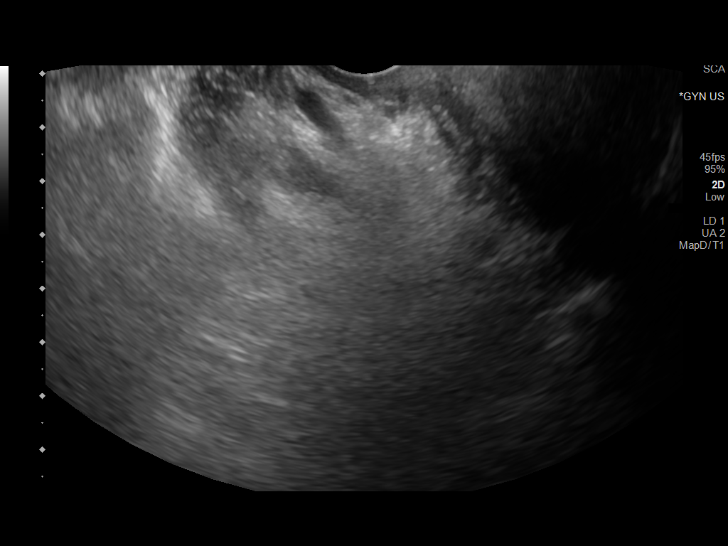
[im 39/39]
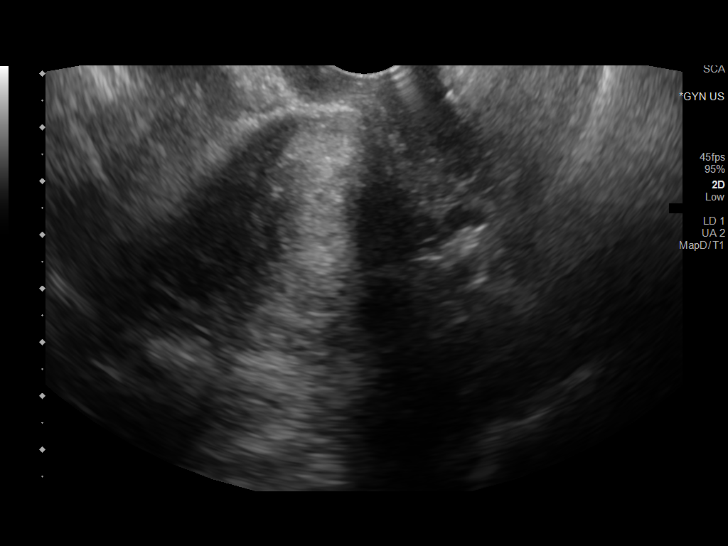

[13 of 25 positions shown; findings below may reference images not displayed]

FINDINGS: Uterus

Measurements: 9.1 x 5.0 x 5.2 cm = volume: 125 mL. Right fundal
fibroid measures up to 11 mm. Mildly heterogeneous echotexture.
Scattered punctate calcifications in the uterus.

Endometrium

Thickness: Thickened, 13 mm.  No focal abnormality visualized.

Right ovary

Measurements: Not visualized.  No adnexal mass seen.

Left ovary

Measurements: Not visualized.  No adnexal mass seen.

Other findings

No abnormal free fluid.
IMPRESSION: Endometrium thickened at 13 mm. In the setting of post-menopausal
bleeding, endometrial sampling is indicated to exclude carcinoma. If
results are benign, sonohysterogram should be considered for focal
lesion work-up. (Ref: Radiological Reasoning: Algorithmic Workup of
Abnormal Vaginal Bleeding with Endovaginal Sonography and
Sonohysterography. AJR [id number]; 191:S68-73)

Small right fundal fibroid.

## 2022-10-20 ENCOUNTER — Ambulatory Visit (INDEPENDENT_AMBULATORY_CARE_PROVIDER_SITE_OTHER): Payer: 59 | Admitting: Family

## 2022-10-20 ENCOUNTER — Other Ambulatory Visit (HOSPITAL_COMMUNITY)
Admission: RE | Admit: 2022-10-20 | Discharge: 2022-10-20 | Disposition: A | Discharge status: Home / Self Care | Payer: 59 | Source: Ambulatory Visit | Attending: Obstetrics & Gynecology | Admitting: Obstetrics & Gynecology

## 2022-10-20 ENCOUNTER — Other Ambulatory Visit (HOSPITAL_BASED_OUTPATIENT_CLINIC_OR_DEPARTMENT_OTHER): Payer: Self-pay

## 2022-10-20 ENCOUNTER — Ambulatory Visit (INDEPENDENT_AMBULATORY_CARE_PROVIDER_SITE_OTHER): Payer: 59 | Admitting: Obstetrics & Gynecology

## 2022-10-20 VITALS — BP 116/55 | HR 78 | Ht 63.0 in | Wt 234.0 lb

## 2022-10-20 DIAGNOSIS — E739 Lactose intolerance, unspecified: Secondary | ICD-10-CM

## 2022-10-20 DIAGNOSIS — B977 Papillomavirus as the cause of diseases classified elsewhere: Secondary | ICD-10-CM

## 2022-10-20 DIAGNOSIS — I10 Essential (primary) hypertension: Secondary | ICD-10-CM

## 2022-10-20 DIAGNOSIS — Z0189 Encounter for other specified special examinations: Secondary | ICD-10-CM | POA: Diagnosis not present

## 2022-10-20 DIAGNOSIS — K219 Gastro-esophageal reflux disease without esophagitis: Secondary | ICD-10-CM

## 2022-10-20 DIAGNOSIS — Z6841 Body Mass Index (BMI) 40.0 and over, adult: Secondary | ICD-10-CM

## 2022-10-20 DIAGNOSIS — E782 Mixed hyperlipidemia: Secondary | ICD-10-CM

## 2022-10-20 DIAGNOSIS — J029 Acute pharyngitis, unspecified: Secondary | ICD-10-CM

## 2022-10-20 DIAGNOSIS — J45909 Unspecified asthma, uncomplicated: Secondary | ICD-10-CM | POA: Diagnosis not present

## 2022-10-20 DIAGNOSIS — Z7985 Long-term (current) use of injectable non-insulin antidiabetic drugs: Secondary | ICD-10-CM | POA: Diagnosis not present

## 2022-10-20 DIAGNOSIS — Z780 Asymptomatic menopausal state: Secondary | ICD-10-CM

## 2022-10-20 DIAGNOSIS — E119 Type 2 diabetes mellitus without complications: Secondary | ICD-10-CM

## 2022-10-20 DIAGNOSIS — J020 Streptococcal pharyngitis: Secondary | ICD-10-CM | POA: Diagnosis not present

## 2022-10-20 DIAGNOSIS — R87810 Cervical high risk human papillomavirus (HPV) DNA test positive: Secondary | ICD-10-CM

## 2022-10-20 DIAGNOSIS — R87612 Low grade squamous intraepithelial lesion on cytologic smear of cervix (LGSIL): Secondary | ICD-10-CM | POA: Diagnosis not present

## 2022-10-20 LAB — HEMOGLOBIN A1C: Hgb A1c MFr Bld: 5.8 % (ref 4.6–6.5)

## 2022-10-20 LAB — POCT RAPID STREP A (OFFICE): Rapid Strep A Screen: POSITIVE — AB

## 2022-10-20 MED ORDER — QVAR REDIHALER 80 MCG/ACT IN AERB: INHALATION_SPRAY | RESPIRATORY_TRACT | 1 refills | Status: DC

## 2022-10-20 MED ORDER — TIRZEPATIDE 5 MG/0.5ML ~~LOC~~ SOAJ
5.0000 mg | SUBCUTANEOUS | 2 refills | Status: DC
  Filled 2022-10-20: qty 2, 28d supply, fill #0
  Filled 2022-11-15: qty 2, 28d supply, fill #1
  Filled 2022-12-14: qty 2, 28d supply, fill #2

## 2022-10-20 MED ORDER — AMOXICILLIN 500 MG PO CAPS
500.0000 mg | ORAL_CAPSULE | Freq: Three times a day (TID) | ORAL | 0 refills | Status: AC
  Filled 2022-10-20: qty 30, 10d supply, fill #0

## 2022-10-20 NOTE — Assessment & Plan Note (Addendum)
Lab Results  Component Value Date   HGBA1C 6.0 07/06/2022   HGBA1C 6.2 04/06/2022   HGBA1C 6.1 12/04/2021   Lab Results  Component Value Date   MICROALBUR <0.7 04/06/2022   LDLCALC 102 (H) 07/06/2022   CREATININE 0.78 04/06/2022  Last A1C was 6.0. Patient on Jesu. -Continue current medication. Check A1C today.

## 2022-10-20 NOTE — Assessment & Plan Note (Addendum)
Wt Readings from Last 3 Encounters:  10/20/22 234 lb (106.1 kg)  10/12/22 234 lb 6.4 oz (106.3 kg)  10/05/22 230 lb 1.6 oz (104.4 kg)   Stable weight on Mounjaro 2.5mg . Some change in appetite noted. -Increase Mounjaro to 5mg .

## 2022-10-20 NOTE — Progress Notes (Signed)
   Patient given informed consent, signed copy in the chart, time out was performed.  Placed in lithotomy position. Cervix viewed with speculum and colposcope after application of acetic acid.  09/08/2022 HIGH RISK HPV ([place]): Positive Important  YPV 16 ([place]): Negative HPV 18 / 45 ([place]): Positive Important  ADEQUACY: Satisfactory for evaluation; transformation zone component PRESENT. DIAGNOSIS: - Low grade squamous intraepithelial lesion (LSIL) Important  COMMENT - CYTOLOGY: There are a few cells suggestive of a higher grade lesion. Clinical COMMENT - CYTOLOGY: correlation is recommended. COMMENT (MOLECULAR): Normal Reference Range HPV - Negative COMMENT (MOLECULAR): Normal Reference Range HPV 16- Negative COMMENT (MOLECULAR): Normal Reference Range HPV 16 18 45 -Negative  Colposcopy adequate?  yes Acetowhite lesions?  no Punctation?  no Mosaicism?   no Abnormal vasculature?  no Biopsies?  no ECC?  yes  Patient was given post procedure instructions.  She will return in 2 weeks for results.  Yona Kosek L. Harraway-Smith, M.D., [facility]

## 2022-10-20 NOTE — Assessment & Plan Note (Signed)
New. Will rx with amoxicillin.  

## 2022-10-20 NOTE — Assessment & Plan Note (Addendum)
Lab Results  Component Value Date   CHOL 175 07/06/2022   HDL 60.30 07/06/2022   LDLCALC 102 (H) 07/06/2022   TRIG 63.0 07/06/2022   CHOLHDL 3 07/06/2022   On Pravastatin 80mg . Last cholesterol check in February was good. -Continue current medication.

## 2022-10-20 NOTE — Progress Notes (Signed)
Subjective:     Patient ID: Leah Hampton, female    DOB: July 28, 1955, 67 y.o.   MRN: [id number]  Chief Complaint  Patient presents with   Sore Throat    Complains of sore throat, "Better since the weekend"    Ear Pain    Complains of right ear pain, "feeling better today"    HPI  Discussed the use of AI scribe software for clinical note transcription with the patient, who gave verbal consent to proceed.  History of Present Illness   The patient, with a history of hypertension, asthma, and hyperlipidemia, presents with an ear problem that started last week. She self-medicated with Theraflu, which she felt helped. She also reports occasional sore throat, which she attributes to residual irritation from a surgery to remove a tumor two years ago. She denies fever.  The patient's hypertension is well-controlled on losartan and amlodipine, with a blood pressure of 116/60. She reports feeling okay on these medications. She has been taking Mounjaro for weight loss and has noticed a slight decrease in appetite. She has not noticed significant weight loss yet and will be increasing the dose to 5mg .  The patient also has asthma, which is managed with albuterol as needed and Qvar. She reports needing the albuterol approximately twice a week for wheezing. She also takes omeprazole 20mg  daily for heartburn, which she is considering reducing to every other day.  The patient has a history of lactose intolerance and reports occasional diarrhea, which she manages with Pepto Bismol as needed. She also reports occasional itching at the site of her Mounjaro injections.          Health Maintenance Due  Topic Date Due   Zoster Vaccines- Shingrix (1 of 2) Never done   DTaP/Tdap/Td (2 - Tdap) 07/03/2018   DEXA SCAN  Never done   Medicare Annual Wellness (AWV)  11/24/2022    Past Medical History:  Diagnosis Date   Bronchitis    Chronic bronchitis (HCC)    Cystitis    Diabetes type 2,  controlled (HCC)    Hematuria    urologice eval, Dr Lindley Magnus   History of chest pain    Hyperbilirubinemia    Hyperlipemia    Hypertelorism    Hypertension    Leukocytosis    Obesity    Osteoarthritis    right knee   Sleep apnea 2012   slight    Vaginal Pap smear, abnormal    Vitamin D deficiency 02/09/2018    Past Surgical History:  Procedure Laterality Date   CERVICAL POLYPECTOMY  07/15/2020   Procedure: POLYPECTOMY with Myosure;  Surgeon: Willodean Rosenthal, MD;  Location: [facility] OR;  Service: Gynecology;;   COLONOSCOPY WITH PROPOFOL N/A 03/27/2015   Procedure: COLONOSCOPY WITH PROPOFOL;  Surgeon: Rachael Fee, MD;  Location: [facility] ENDOSCOPY;  Service: Endoscopy;  Laterality: N/A;   COLONOSCOPY WITH PROPOFOL N/A 10/02/2020   Procedure: COLONOSCOPY WITH PROPOFOL;  Surgeon: Rachael Fee, MD;  Location: [facility] ENDOSCOPY;  Service: Endoscopy;  Laterality: N/A;   COLPOSCOPY N/A 07/15/2020   Procedure: COLPOSCOPY;  Surgeon: Willodean Rosenthal, MD;  Location: [facility] OR;  Service: Gynecology;  Laterality: N/A;   DILATATION & CURETTAGE/HYSTEROSCOPY WITH MYOSURE N/A 01/05/2017   Procedure: DILATATION & CURETTAGE/HYSTEROSCOPY WITH MYOSURE;  Surgeon: Geryl Rankins, MD;  Location: [facility] ORS;  Service: Gynecology;  Laterality: N/A;  PostMenopausal Bleeding   HYSTEROSCOPY WITH D & C N/A 05/22/2019   Procedure: DILATATION AND CURETTAGE /HYSTEROSCOPY, POLYPECTOMY WITH MYOSURE;  Surgeon: Erin Fulling,  Eber Jones, MD;  Location: [facility] ORS;  Service: Gynecology;  Laterality: N/A;   HYSTEROSCOPY WITH D & C N/A 07/15/2020   Procedure: DILATATION AND CURETTAGE /HYSTEROSCOPY;  Surgeon: Willodean Rosenthal, MD;  Location: [facility] OR;  Service: Gynecology;  Laterality: N/A;   KNEE ARTHROSCOPY  04/2007   left knee   lap sleeve     TOTAL KNEE ARTHROPLASTY Left 10/2008   Dr Irine Seal   UPPER GI ENDOSCOPY N/A 10/14/2020   Procedure: UPPER GI ENDOSCOPY;  Surgeon: Gaynelle Adu, MD;  Location: [facility] ORS;  Service:  General;  Laterality: N/A;    Family History  Problem Relation Age of Onset   Heart failure Mother    Arthritis Mother    Hypertension Mother    Stroke Mother    Diabetes Mellitus II Mother    Heart attack Father 67   Diabetes Other    Arthritis Other    Stroke Other    Coronary artery disease Other    Hypertension Sister        x 2   CVA Sister    CAD Sister 78   Diabetes Mellitus II Sister    Cirrhosis Brother    Alcohol abuse Brother    Colon cancer Neg Hx     Social History   Socioeconomic History   Marital status: Single    Spouse name: Not on file   Number of children: 2   Years of education: Not on file   Highest education level: Not on file  Occupational History   Occupation: disabled  Tobacco Use   Smoking status: Never   Smokeless tobacco: Never  Vaping Use   Vaping Use: Never used  Substance and Sexual Activity   Alcohol use: No   Drug use: No   Sexual activity: Not Currently  Other Topics Concern   Not on file  Social History Narrative   Regular exercise:  No   Lives alone   Has 2 grown sons, both local.  1 grandson   On disability due to her knee pain   Completed 12th grade   Enjoys television   No pets.    Social Determinants of Health   Financial Resource Strain: Low Risk  (11/18/2020)   Overall Financial Resource Strain (CARDIA)    Difficulty of Paying Living Expenses: Not hard at all  Food Insecurity: No Food Insecurity (02/20/2020)   Hunger Vital Sign    Worried About Running Out of Food in the Last Year: Never true    Ran Out of Food in the Last Year: Never true  Transportation Needs: No Transportation Needs (11/18/2020)   PRAPARE - Administrator, Civil Service (Medical): No    Lack of Transportation (Non-Medical): No  Physical Activity: Insufficiently Active (11/18/2020)   Exercise Vital Sign    Days of Exercise per Week: 1 day    Minutes of Exercise per Session: 20 min  Stress: No Stress Concern Present (11/18/2020)    [employer] of Occupational Health - Occupational Stress Questionnaire    Feeling of Stress : Not at all  Social Connections: Moderately Isolated (11/18/2020)   Social Connection and Isolation Panel [NHANES]    Frequency of Communication with Friends and Family: More than three times a week    Frequency of Social Gatherings with Friends and Family: Once a week    Attends Religious Services: 1 to 4 times per year    Active Member of [employer] or Organizations: No    Attends Banker  Meetings: Never    Marital Status: Never married  Intimate Partner Violence: Not At Risk (11/18/2020)   Humiliation, Afraid, Rape, and Kick questionnaire    Fear of Current or Ex-Partner: No    Emotionally Abused: No    Physically Abused: No    Sexually Abused: No    Outpatient Medications Prior to Visit  Medication Sig Dispense Refill   albuterol (VENTOLIN HFA) 108 (90 Base) MCG/ACT inhaler USE 2 INHALATIONS BY MOUTH EVERY 6 HOURS AS NEEDED FOR WHEEZING  OR SHORTNESS OF BREATH 54 g 3   amLODipine (NORVASC) 5 MG tablet TAKE 1 TABLET BY MOUTH DAILY 100 tablet 1   desoximetasone (TOPICORT) 0.25 % cream Apply 1 Application topically 2 (two) times daily. 30 g 0   diclofenac Sodium (VOLTAREN) 1 % GEL Apply 2 g topically 4 (four) times daily. 100 g 1   losartan (COZAAR) 100 MG tablet TAKE 1 TABLET BY MOUTH DAILY 100 tablet 2   Multiple Vitamin (MULTIVITAMIN WITH MINERALS) TABS tablet Take 1 tablet by mouth daily. Vitafusion     omeprazole (PRILOSEC) 20 MG capsule TAKE 1 CAPSULE BY MOUTH DAILY 100 capsule 2   pravastatin (PRAVACHOL) 80 MG tablet TAKE 1 TABLET BY MOUTH DAILY 100 tablet 2   psyllium (METAMUCIL) 58.6 % powder Take 1 packet by mouth daily as needed (constipation).     valACYclovir (VALTREX) 1000 MG tablet TAKE 1 TABLET BY MOUTH DAILY 100 tablet 2   QVAR REDIHALER 80 MCG/ACT inhaler USE 1 INHALATION BY MOUTH INTO  THE LUNGS TWICE DAILY 21.2 g 2   tirzepatide (MOUNJARO) 2.5 MG/0.5ML Pen Inject  2.5 mg into the skin once a week. 2 mL 2   No facility-administered medications prior to visit.    Allergies  Allergen Reactions   Sulfamethoxazole-Trimethoprim Itching   Sulfamethoxazole-Trimethoprim [Bactrim] Itching   Codeine Nausea And Vomiting and Anxiety    ROS     Objective:    Physical Exam Constitutional:      General: She is not in acute distress.    Appearance: Normal appearance. She is well-developed.  HENT:     Head: Normocephalic and atraumatic.     Right Ear: Tympanic membrane, ear canal and external ear normal.     Left Ear: Tympanic membrane, ear canal and external ear normal.     Mouth/Throat:     Pharynx: Posterior oropharyngeal erythema present. No pharyngeal swelling or uvula swelling.     Tonsils: No tonsillar abscesses.     Comments: + exudates noted on posterior oropharynx/soft palate Eyes:     General: No scleral icterus. Neck:     Thyroid: No thyromegaly.  Cardiovascular:     Rate and Rhythm: Normal rate and regular rhythm.     Heart sounds: Normal heart sounds. No murmur heard. Pulmonary:     Effort: Pulmonary effort is normal. No respiratory distress.     Breath sounds: Normal breath sounds. No wheezing.  Musculoskeletal:     Cervical back: Neck supple.  Skin:    General: Skin is warm and dry.  Neurological:     Mental Status: She is alert and oriented to person, place, and time.  Psychiatric:        Mood and Affect: Mood normal.        Behavior: Behavior normal.        Thought Content: Thought content normal.        Judgment: Judgment normal.      BP 116/60 (BP Location: Right Arm, Patient Position:  Sitting, Cuff Size: Large)   Pulse 76   Temp 98 F (36.7 C) (Oral)   Resp 16   Wt 234 lb (106.1 kg)   LMP 11/10/2011   SpO2 98%   BMI 41.45 kg/m  Wt Readings from Last 3 Encounters:  10/20/22 234 lb (106.1 kg)  10/20/22 234 lb (106.1 kg)  10/12/22 234 lb 6.4 oz (106.3 kg)       Assessment & Plan:   Problem List Items  Addressed This Visit       Unprioritized   Hyperlipidemia (Chronic)    Lab Results  Component Value Date   CHOL 175 07/06/2022   HDL 60.30 07/06/2022   LDLCALC 102 (H) 07/06/2022   TRIG 63.0 07/06/2022   CHOLHDL 3 07/06/2022  On Pravastatin 80mg . Last cholesterol check in February was good. -Continue current medication.         Uncomplicated asthma    Using Albuterol and Qvar. Albuterol needed approximately twice a week. -Continue current medications. Refill Qvar for 90-day supply.      Relevant Medications   beclomethasone (QVAR REDIHALER) 80 MCG/ACT inhaler   Type 2 diabetes mellitus without complication, without long-term current use of insulin (HCC)    Lab Results  Component Value Date   HGBA1C 6.0 07/06/2022   HGBA1C 6.2 04/06/2022   HGBA1C 6.1 12/04/2021   Lab Results  Component Value Date   MICROALBUR <0.7 04/06/2022   LDLCALC 102 (H) 07/06/2022   CREATININE 0.78 04/06/2022  Last A1C was 6.0. Patient on Jesu. -Continue current medication. Check A1C today.       Relevant Medications   tirzepatide (MOUNJARO) 5 MG/0.5ML Pen   Other Relevant Orders   HgB A1c (Completed)   Basic Metabolic Panel (BMET) (Completed)   Strep pharyngitis    New. Will rx with amoxicillin.       Severe obesity (BMI >= 40) (HCC) - Primary    Wt Readings from Last 3 Encounters:  10/20/22 234 lb (106.1 kg)  10/12/22 234 lb 6.4 oz (106.3 kg)  10/05/22 230 lb 1.6 oz (104.4 kg)  Stable weight on Mounjaro 2.5mg . Some change in appetite noted. -Increase Mounjaro to 5mg .      Relevant Medications   tirzepatide [facility]) 5 MG/0.5ML Pen   Lactose intolerance    Reports diarrhea with dairy consumption. -Avoid dairy or consider lactase supplements if desired.       Gastroesophageal reflux disease    On Omeprazole 20mg  daily. Considering reducing to every other day. -Continue current medication. Patient may try every other day dosing if symptoms allow.      Essential  hypertension    Well controlled on Losartan and Amlodipine. Blood pressure at visit was 116/60. -Continue current medications.      Other Visit Diagnoses     Postmenopausal estrogen deficiency       Relevant Orders   DG Bone Density   Sore throat       Relevant Orders   POCT rapid strep A (Completed)      General Health Maintenance: -Order bone density test to screen for osteoporosis. -Check back in three months. I have discontinued Michel Harrow. [place]'s Mounjaro. I have also changed her Qvar RediHaler. Additionally, I am having her start on tirzepatide and amoxicillin. Lastly, I am having her maintain her multivitamin with minerals, psyllium, diclofenac Sodium, losartan, desoximetasone, albuterol, omeprazole, pravastatin, valACYclovir, and amLODipine.  Meds ordered this encounter  Medications   tirzepatide (MOUNJARO) 5 MG/0.5ML Pen    Sig: Inject 5  mg into the skin once a week.    Dispense:  2 mL    Refill:  2    Please cancel 2.5mg  Rx, we are increasing    Order Specific Question:   Supervising Provider    Answer:   Danise Edge A [[id number]]   beclomethasone (QVAR REDIHALER) 80 MCG/ACT inhaler    Sig: USE 1 INHALATION BY MOUTH INTO  THE LUNGS TWICE DAILY    Dispense:  31.8 g    Refill:  1    Please send a replace/new response with 100-Day Supply if appropriate to maximize member benefit. Requesting 1 year supply.    Order Specific Question:   Supervising Provider    Answer:   Danise Edge A [[id number]]   amoxicillin (AMOXIL) 500 MG capsule    Sig: Take 1 capsule (500 mg total) by mouth 3 (three) times daily for 10 days.    Dispense:  30 capsule    Refill:  0    Order Specific Question:   Supervising Provider    Answer:   Danise Edge A [[id number]]

## 2022-10-21 LAB — BASIC METABOLIC PANEL
BUN: 15 mg/dL (ref 6–23)
CO2: 25 mEq/L (ref 19–32)
Calcium: 9.3 mg/dL (ref 8.4–10.5)
Chloride: 106 mEq/L (ref 96–112)
Creatinine, Ser: 0.78 mg/dL (ref 0.40–1.20)
GFR: 79.91 mL/min (ref >60.00)
Glucose, Bld: 87 mg/dL (ref 70–99)
Potassium: 4 mEq/L (ref 3.5–5.1)
Sodium: 143 mEq/L (ref 135–145)

## 2022-10-22 DIAGNOSIS — E739 Lactose intolerance, unspecified: Secondary | ICD-10-CM | POA: Insufficient documentation

## 2022-10-22 LAB — SURGICAL PATHOLOGY

## 2022-10-22 NOTE — Assessment & Plan Note (Signed)
Using Albuterol and Qvar. Albuterol needed approximately twice a week. -Continue current medications. Refill Qvar for 90-day supply.

## 2022-10-22 NOTE — Assessment & Plan Note (Signed)
Well controlled on Losartan and Amlodipine. Blood pressure at visit was 116/60. -Continue current medications.

## 2022-10-22 NOTE — Assessment & Plan Note (Signed)
Reports diarrhea with dairy consumption. -Avoid dairy or consider lactase supplements if desired.

## 2022-10-22 NOTE — Assessment & Plan Note (Signed)
On Omeprazole 20mg  daily. Considering reducing to every other day. -Continue current medication. Patient may try every other day dosing if symptoms allow.

## 2022-10-24 ENCOUNTER — Encounter: Payer: Self-pay | Admitting: Obstetrics & Gynecology

## 2022-10-25 ENCOUNTER — Telehealth: Payer: Self-pay

## 2022-10-25 NOTE — Telephone Encounter (Signed)
Patient called and made aware that her colpo revealed low grade changes. Patient states understanding and realizes need for annual exam in one year. Armandina Stammer RN

## 2022-10-25 NOTE — Telephone Encounter (Signed)
-----   Message from Willodean Rosenthal, MD sent at 10/24/2022  8:44 PM EDT ----- Please call pt. Her colpo shows low grade changes.  We need to repeat her PAP in 1 year.   No treatment needed at present. Follow up is important.   Thx  Clh-S

## 2022-10-28 ENCOUNTER — Ambulatory Visit (HOSPITAL_BASED_OUTPATIENT_CLINIC_OR_DEPARTMENT_OTHER)
Admission: RE | Admit: 2022-10-28 | Discharge: 2022-10-28 | Disposition: A | Discharge status: Home / Self Care | Payer: 59 | Source: Ambulatory Visit | Attending: Family | Admitting: Family

## 2022-10-28 DIAGNOSIS — Z780 Asymptomatic menopausal state: Secondary | ICD-10-CM | POA: Diagnosis not present

## 2022-11-02 ENCOUNTER — Ambulatory Visit: Payer: 59 | Admitting: Licensed Clinical Social Worker

## 2022-11-02 NOTE — Patient Outreach (Signed)
  Care Coordination  Initial Visit Note   11/02/2022 Name: Leah Hampton MRN: [id number] DOB: 1955-10-06  Leah Hampton is a 67 y.o. year old female who sees Sandford Craze, NP for primary care. I spoke with  Leah Hampton by phone today.  What matters to the patients health and wellness today?  Doing better with heathy eating    Goals Addressed             This Visit's Progress    Care Coordination Activites       Activities and task to complete in order to accomplish goals.   Call [facility] Medicare to follow up on your Health Navigator Keep all upcoming appointment discussed today Continue with compliance of taking medication prescribed by Doctor I have scheduled a phone appointment with the RN Care Manager she will provide educational informational and assist you with managing your health needs        SDOH assessments and interventions completed:  Yes  SDOH Interventions Today    Flowsheet Row Most Recent Value  SDOH Interventions   Food Insecurity Interventions Intervention Not Indicated  Housing Interventions Intervention Not Indicated  Transportation Interventions Intervention Not Indicated  Utilities Interventions Intervention Not Indicated  Financial Strain Interventions Intervention Not Indicated  Physical Activity Interventions Intervention Not Indicated  Stress Interventions Provide Counseling       Care Coordination Interventions:  Yes, provided  Interventions Today    Flowsheet Row Most Recent Value  Chronic Disease   Chronic disease during today's visit Hypertension (HTN), Diabetes  General Interventions   General Interventions Discussed/Reviewed General Interventions Discussed, Referral to Nurse  [reviewed care coordination program]  Exercise Interventions   Exercise Discussed/Reviewed Exercise Discussed, Physical Activity  Education Interventions   Education Provided Provided Education  Provided Verbal Education On Insurance Plans  [made  sure she had number to contact [facility] Navigator if needed]  Mental Health Interventions   Mental Health Discussed/Reviewed Mental Health Reviewed  Nutrition Interventions   Nutrition Discussed/Reviewed Nutrition Discussed  Pharmacy Interventions   Pharmacy Dicussed/Reviewed Pharmacy Topics Discussed       Follow up plan:  Referral made to RN for healthy life style LCSW will f/u after initial call with RN    Encounter Outcome:  Pt. Visit Completed   Sammuel Hines, LCSW Social Work Care Coordination  [facility] [employer] [phone]

## 2022-11-02 NOTE — Patient Instructions (Signed)
Social Work Visit Information  Thank you for taking time to visit with me today. Please don't hesitate to contact me if I can be of assistance to you.   Following are the goals we discussed today:   Goals Addressed             This Visit's Progress    Care Coordination Activites       Activities and task to complete in order to accomplish goals.   Call [facility] Medicare to follow up on your Health Navigator Keep all upcoming appointment discussed today Continue with compliance of taking medication prescribed by Doctor I have scheduled a phone appointment with the RN Care Manager she will provide educational informational and assist you with managing your health needs         No follow up scheduled with social work at this time. Will follow up in 1 to 2 weeks .Patient will call office if needed prior to next encounter.  Please call the care guide team at [phone] if you need to cancel or reschedule your appointment.   If you or anyone you know are experiencing a Mental Health or Behavioral Health Crisis or need someone to talk to, please call the Suicide and Crisis Lifeline: 988 call the [place] National Suicide Prevention Lifeline: 303 850 9756 or TTY: (901)402-9273 TTY 724-552-8952) to talk to a trained counselor call 1-800-273-TALK (toll free, 24 hour hotline) go to [facility] [address] [place] [phone])   Patient verbalizes understanding of instructions and care plan provided today and agrees to view in MyChart. Active MyChart status and patient understanding of how to access instructions and care plan via MyChart confirmed with patient.     Sammuel Hines, LCSW Social Work Care Coordination  [facility] [employer] [employer] [phone]

## 2022-11-05 ENCOUNTER — Ambulatory Visit: Payer: 59 | Admitting: Family

## 2022-11-09 ENCOUNTER — Ambulatory Visit: Payer: Self-pay

## 2022-11-09 NOTE — Patient Instructions (Signed)
Visit Information  Thank you for taking time to visit with me today. Please don't hesitate to contact me if I can be of assistance to you.   Following are the goals we discussed today:  Please review Emmi educational videos. Please contact RN Care Coordinator with questions Continue working with your Nutritionist at [facility] Nutrition and Diabetes Education Services as scheduled Additional resource: the Hospital doctor at [employer] at [place]. You can register online or if you have questions contact Instructor Darnell Level at: [email or web address]    Our next appointment is by telephone on 12/09/22 at 3:00 pm  Please call the care guide team at [phone] if you need to cancel or reschedule your appointment.   If you are experiencing a Mental Health or Behavioral Health Crisis or need someone to talk to, please call the Suicide and Crisis Lifeline: 41  Kathyrn Sheriff, RN, MSN, BSN, CCM [facility] Care Coordinator [phone]

## 2022-11-09 NOTE — Patient Outreach (Signed)
  Care Coordination   Initial Visit Note   11/09/2022 Name: Leah Hampton MRN: [id number] DOB: April 03, 1956  Leah Hampton is a 67 y.o. year old female who sees Sandford Craze, NP for primary care. I spoke with  [name] by phone today.  What matters to the patients health and wellness today?  [name] report she is interested in nutrition emphasizing breakfast. She reports she is already active with the [facility] and Nutrition Diabetes Services. She denies any additional care coordination, disease management or resource needs.  Goals Addressed             This Visit's Progress    Education: nutrition       Interventions Today    Flowsheet Row Most Recent Value  Chronic Disease   Chronic disease during today's visit Diabetes, Hypertension (HTN)  General Interventions   General Interventions Discussed/Reviewed General Interventions Discussed, Doctor Visits, Community Resources  [provided contact number and encouraged to call RNCM with care coordination needs as needed,  advised to attend next scheduled visit with nutritionist with [facility] diabetes/nutrition program.]  Exercise Interventions   Exercise Discussed/Reviewed Physical Activity  Physical Activity Discussed/Reviewed Physical Activity Discussed  [reports very active walking the treadmill 5 days/week]  Education Interventions   Education Provided Provided Web-based Education  Provided Verbal Education On Nutrition, Community Resources  [place] emmi education: diabetes nutrition and healthy eating,  Nutrition: full program, provided information on teaching kitchen at [facility] and instructed on how to sign up on internet-provided contact information to Instructor]            SDOH assessments and interventions completed:  No recently completed. No changes.   Care Coordination Interventions:  Yes, provided   Follow up plan: Follow up call scheduled for 12/09/22    Encounter Outcome:  Pt. Visit Completed

## 2022-11-15 ENCOUNTER — Other Ambulatory Visit (HOSPITAL_BASED_OUTPATIENT_CLINIC_OR_DEPARTMENT_OTHER): Payer: Self-pay

## 2022-11-16 ENCOUNTER — Ambulatory Visit: Payer: 59 | Admitting: Licensed Clinical Social Worker

## 2022-11-16 NOTE — Patient Outreach (Signed)
  Care Coordination  Follow Up Visit Note   11/16/2022 Name: ERNESTYNE KRENZKE MRN: [id number] DOB: 12/10/1955  LAM STILES is a 67 y.o. year old female who sees Sandford Craze, NP for primary care. I spoke with  [name] by phone today.  What matters to the patients health and wellness today?  Managing her health  Patient is interested in completed her advance directive   Goals Addressed             This Visit's Progress    Care Coordination Activites       Activities and task to complete in order to accomplish goals.   Call [facility] Medicare to follow up on your Health Navigator Keep all upcoming appointment discussed today Continue with compliance of taking medication prescribed by Doctor Complete Advance Directive packet,  Have advance directive notarized and provide a copy to provider office  Review EMMI educational information on Advance Directive. Look for an e-mail from [facility].        SDOH assessments and interventions completed:  No   Care Coordination Interventions:  Yes, provided  Interventions Today    Flowsheet Row Most Recent Value  Chronic Disease   Chronic disease during today's visit Hypertension (HTN), Diabetes  General Interventions   General Interventions Discussed/Reviewed General Interventions Reviewed  Education Interventions   Education Provided Provided Education, Provided Printed Education  [EMMI]  Advanced Directive Interventions   Advanced Directives Discussed/Reviewed Advanced Directives Discussed, Provided resource for acquiring and filling out documents       Follow up plan: Follow up call scheduled for 12/20/22    Encounter Outcome:  Pt. Visit Completed   Sammuel Hines, LCSW Social Work Care Coordination  [facility] [employer] [phone]

## 2022-11-16 NOTE — Patient Instructions (Signed)
Social Work Visit Information  Thank you for taking time to visit with me today. Please don't hesitate to contact me if I can be of assistance to you.   Following are the goals we discussed today:   Goals Addressed             This Visit's Progress    Care Coordination Activites       Activities and task to complete in order to accomplish goals.   Call [facility] Medicare to follow up on your Health Navigator Keep all upcoming appointment discussed today Continue with compliance of taking medication prescribed by Doctor Complete Advance Directive packet,  Have advance directive notarized and provide a copy to provider office  Review EMMI educational information on Advance Directive. Look for an e-mail from [facility].         Our next appointment is by telephone on 12/20/22 at 11;00   Please call the care guide team at [phone] if you need to cancel or reschedule your appointment.   If you or anyone you know are experiencing a Mental Health or Behavioral Health Crisis or need someone to talk to, please call the Suicide and Crisis Lifeline: 988 call the [place] National Suicide Prevention Lifeline: (770) 827-3058 or TTY: 215-530-9130 TTY (254)118-9997) to talk to a trained counselor call 1-800-273-TALK (toll free, 24 hour hotline) go to [facility] [address] [place] [phone])   Patient verbalizes understanding of instructions and care plan provided today and agrees to view in MyChart. Active MyChart status and patient understanding of how to access instructions and care plan via MyChart confirmed with patient.       Sammuel Hines, LCSW Social Work Care Coordination  [facility] Emmie Niemann [employer] [phone]

## 2022-11-24 ENCOUNTER — Other Ambulatory Visit: Payer: Self-pay | Admitting: Emergency Medicine

## 2022-11-25 ENCOUNTER — Ambulatory Visit: Payer: 59 | Admitting: Emergency Medicine

## 2022-11-25 VITALS — Ht 63.0 in | Wt 227.0 lb

## 2022-11-25 DIAGNOSIS — Z0000 Encounter for general adult medical examination without abnormal findings: Secondary | ICD-10-CM

## 2022-11-25 NOTE — Progress Notes (Addendum)
Subjective:   Leah Hampton is a 67 y.o. female who presents for Medicare Annual (Subsequent) preventive examination.  Visit Complete: Virtual  I connected with  Leah Hampton on 11/25/22 by a audio enabled telemedicine application and verified that I am speaking with the correct person using two identifiers.  Patient Location: Home  Provider Location: Home Office  I discussed the limitations of evaluation and management by telemedicine. The patient expressed understanding and agreed to proceed.   Review of Systems     Cardiac Risk Factors include: advanced age (>14men, >71 women);diabetes mellitus;dyslipidemia;hypertension;obesity (BMI >30kg/m2)     Objective:    Today's Vitals   11/25/22 1125 11/25/22 1126  Weight: 227 lb (103 kg)   Height: 5\' 3"  (1.6 m)   PainSc:  3    Body mass index is 40.21 kg/m.     11/25/2022   11:45 AM 11/23/2021    1:03 PM 11/18/2020    9:44 AM 10/07/2020   10:59 AM 10/02/2020    9:00 AM 08/18/2020    5:20 PM 07/15/2020    9:31 AM  Advanced Directives  Does Patient Have a Medical Advance Directive? No No No No No No No  Would patient like information on creating a medical advance directive? No - Patient declined No - Patient declined No - Patient declined  Yes (MAU/Ambulatory/Procedural Areas - Information given) No - Patient declined No - Patient declined    Current Medications (verified) Outpatient Encounter Medications as of 11/25/2022  Medication Sig   albuterol (VENTOLIN HFA) 108 (90 Base) MCG/ACT inhaler USE 2 INHALATIONS BY MOUTH EVERY 6 HOURS AS NEEDED FOR WHEEZING  OR SHORTNESS OF BREATH   amLODipine (NORVASC) 5 MG tablet TAKE 1 TABLET BY MOUTH DAILY   beclomethasone (QVAR REDIHALER) 80 MCG/ACT inhaler USE 1 INHALATION BY MOUTH INTO  THE LUNGS TWICE DAILY   diclofenac Sodium (VOLTAREN) 1 % GEL Apply 2 g topically 4 (four) times daily.   losartan (COZAAR) 100 MG tablet TAKE 1 TABLET BY MOUTH DAILY   Multiple Vitamin (MULTIVITAMIN  WITH MINERALS) TABS tablet Take 1 tablet by mouth daily. Vitafusion   omeprazole (PRILOSEC) 20 MG capsule TAKE 1 CAPSULE BY MOUTH DAILY   pravastatin (PRAVACHOL) 80 MG tablet TAKE 1 TABLET BY MOUTH DAILY   tirzepatide (MOUNJARO) 5 MG/0.5ML Pen Inject 5 mg into the skin once a week.   valACYclovir (VALTREX) 1000 MG tablet TAKE 1 TABLET BY MOUTH DAILY   psyllium (METAMUCIL) 58.6 % powder Take 1 packet by mouth daily as needed (constipation). (Patient not taking: Reported on 11/25/2022)   No facility-administered encounter medications on file as of 11/25/2022.    Allergies (verified) Sulfamethoxazole-trimethoprim, Sulfamethoxazole-trimethoprim [bactrim], and Codeine   History: Past Medical History:  Diagnosis Date   Bronchitis    Chronic bronchitis (HCC)    Cystitis    Diabetes type 2, controlled (HCC)    Hematuria    urologice eval, Dr Lindley Magnus   History of chest pain    Hyperbilirubinemia    Hyperlipemia    Hypertelorism    Hypertension    Leukocytosis    Obesity    Osteoarthritis    right knee   Sleep apnea 2012   slight    Vaginal Pap smear, abnormal    Vitamin D deficiency 02/09/2018   Past Surgical History:  Procedure Laterality Date   CERVICAL POLYPECTOMY  07/15/2020   Procedure: POLYPECTOMY with Myosure;  Surgeon: Willodean Rosenthal, MD;  Location: [facility] OR;  Service: Gynecology;;  COLONOSCOPY WITH PROPOFOL N/A 03/27/2015   Procedure: COLONOSCOPY WITH PROPOFOL;  Surgeon: Rachael Fee, MD;  Location: [facility] ENDOSCOPY;  Service: Endoscopy;  Laterality: N/A;   COLONOSCOPY WITH PROPOFOL N/A 10/02/2020   Procedure: COLONOSCOPY WITH PROPOFOL;  Surgeon: Rachael Fee, MD;  Location: [facility] ENDOSCOPY;  Service: Endoscopy;  Laterality: N/A;   COLPOSCOPY N/A 07/15/2020   Procedure: COLPOSCOPY;  Surgeon: Willodean Rosenthal, MD;  Location: [facility] OR;  Service: Gynecology;  Laterality: N/A;   DILATATION & CURETTAGE/HYSTEROSCOPY WITH MYOSURE N/A 01/05/2017   Procedure: DILATATION &  CURETTAGE/HYSTEROSCOPY WITH MYOSURE;  Surgeon: Geryl Rankins, MD;  Location: [facility] ORS;  Service: Gynecology;  Laterality: N/A;  PostMenopausal Bleeding   HYSTEROSCOPY WITH D & C N/A 05/22/2019   Procedure: DILATATION AND CURETTAGE /HYSTEROSCOPY, POLYPECTOMY WITH MYOSURE;  Surgeon: Willodean Rosenthal, MD;  Location: [facility] ORS;  Service: Gynecology;  Laterality: N/A;   HYSTEROSCOPY WITH D & C N/A 07/15/2020   Procedure: DILATATION AND CURETTAGE /HYSTEROSCOPY;  Surgeon: Willodean Rosenthal, MD;  Location: [facility] OR;  Service: Gynecology;  Laterality: N/A;   KNEE ARTHROSCOPY  04/2007   left knee   lap sleeve     TOTAL KNEE ARTHROPLASTY Left 10/2008   Dr Irine Seal   UPPER GI ENDOSCOPY N/A 10/14/2020   Procedure: UPPER GI ENDOSCOPY;  Surgeon: Gaynelle Adu, MD;  Location: [facility] ORS;  Service: General;  Laterality: N/A;   Family History  Problem Relation Age of Onset   Heart failure Mother    Arthritis Mother    Hypertension Mother    Stroke Mother    Diabetes Mellitus II Mother    Heart attack Father 78   Diabetes Other    Arthritis Other    Stroke Other    Coronary artery disease Other    Hypertension Sister        x 2   CVA Sister    CAD Sister 64   Diabetes Mellitus II Sister    Cirrhosis Brother    Alcohol abuse Brother    Colon cancer Neg Hx    Social History   Socioeconomic History   Marital status: Single    Spouse name: Not on file   Number of children: 2   Years of education: Not on file   Highest education level: Not on file  Occupational History   Occupation: disabled  Tobacco Use   Smoking status: Never   Smokeless tobacco: Never  Vaping Use   Vaping status: Never Used  Substance and Sexual Activity   Alcohol use: No   Drug use: No   Sexual activity: Not Currently  Other Topics Concern   Not on file  Social History Narrative   Regular exercise:  No   Lives alone   Has 2 grown sons, both local.  1 grandson   On disability due to her knee pain   Completed  12th grade   Enjoys television   No pets.    Social Determinants of Health   Financial Resource Strain: Low Risk  (11/25/2022)   Overall Financial Resource Strain (CARDIA)    Difficulty of Paying Living Expenses: Not hard at all  Food Insecurity: No Food Insecurity (11/25/2022)   Hunger Vital Sign    Worried About Running Out of Food in the Last Year: Never true    Ran Out of Food in the Last Year: Never true  Transportation Needs: No Transportation Needs (11/25/2022)   PRAPARE - Transportation    Lack of Transportation (Medical): No    Lack of  Transportation (Non-Medical): No  Physical Activity: Sufficiently Active (11/25/2022)   Exercise Vital Sign    Days of Exercise per Week: 5 days    Minutes of Exercise per Session: 50 min  Stress: No Stress Concern Present (11/25/2022)   [employer] of Occupational Health - Occupational Stress Questionnaire    Feeling of Stress : Not at all  Social Connections: Moderately Integrated (11/25/2022)   Social Connection and Isolation Panel [NHANES]    Frequency of Communication with Friends and Family: More than three times a week    Frequency of Social Gatherings with Friends and Family: Three times a week    Attends Religious Services: More than 4 times per year    Active Member of Clubs or Organizations: Yes    Attends Engineer, structural: More than 4 times per year    Marital Status: Never married    Tobacco Counseling Counseling given: Not Answered   Clinical Intake:  Pre-visit preparation completed: Yes  Pain : 0-10 Pain Score: 3  Pain Type: Chronic pain Pain Location: Other (Comment) (side) Pain Orientation: Left Pain Descriptors / Indicators: Aching Pain Onset: More than a month ago Pain Frequency: Intermittent     BMI - recorded: 40.21 Nutritional Status: BMI > 30  Obese Nutritional Risks: Nausea/ vomitting/ diarrhea Diabetes: Yes (diet controlled) CBG done?: No Did pt. bring in CBG monitor from home?:  No  How often do you need to have someone help you when you read instructions, pamphlets, or other written materials from your doctor or pharmacy?: 1 - Never  Interpreter Needed?: No  Information entered by :: Tora Kindred, CMA   Activities of Daily Living    11/25/2022   11:31 AM  In your present state of health, do you have any difficulty performing the following activities:  Hearing? 0  Vision? 0  Difficulty concentrating or making decisions? 0  Walking or climbing stairs? 1  Comment uses cane  Dressing or bathing? 0  Doing errands, shopping? 0  Preparing Food and eating ? N  Using the Toilet? N  In the past six months, have you accidently leaked urine? N  Do you have problems with loss of bowel control? N  Managing your Medications? N  Managing your Finances? N  Housekeeping or managing your Housekeeping? N    Patient Care Team: Sandford Craze, NP as PCP - General (Internal Medicine) Little Ishikawa, MD as PCP - Cardiology (Cardiology) Sandford Craze, NP as Nurse Practitioner (Internal Medicine) Soundra Pilon, LCSW as Social Worker (Licensed Clinical Social Worker) Colletta Maryland, RN as [employer] control surveyor, P.[employer]. Willodean Rosenthal, MD as Consulting Physician (Obstetrics and Gynecology)  Indicate any recent Medical Services you may have received from other than [facility] providers in the past year (date may be approximate).     Assessment:   This is a routine wellness examination for Brieonna.  Hearing/Vision screen Hearing Screening - Comments:: Denies hearing problems  Dietary issues and exercise activities discussed:     Goals Addressed             This Visit's Progress    Education: nutrition       Interventions Today    Flowsheet Row Most Recent Value  Chronic Disease   Chronic disease during today's visit Diabetes, Hypertension (HTN)  General Interventions   General  Interventions Discussed/Reviewed General Interventions Discussed, Doctor Visits, [facility]  [provided contact number and encouraged to call RNCM with care coordination needs  as needed,  advised to attend next scheduled visit with nutritionist with [facility] diabetes/nutrition program.]  Exercise Interventions   Exercise Discussed/Reviewed Physical Activity  Physical Activity Discussed/Reviewed Physical Activity Discussed  [reports very active walking the treadmill 5 days/week]  Education Interventions   Education Provided Provided Web-based Education  Provided Verbal Education On Nutrition, Community Resources  [place] emmi education: diabetes nutrition and healthy eating,  Nutrition: full program, provided information on teaching kitchen at [facility] and instructed on how to sign up on internet-provided contact information to Instructor]           Weight (lb) < 200 lb (90.7 kg)   227 lb (103 kg)     Depression Screen    11/25/2022   11:41 AM 09/08/2022    1:27 PM 07/06/2022   10:51 AM 11/23/2021    1:03 PM 11/18/2020    9:47 AM 11/04/2020   11:21 AM 09/15/2020    8:26 AM  PHQ 2/9 Scores  PHQ - 2 Score 0 1 0 0 0 1 0  PHQ- 9 Score 0 7 0        Fall Risk    11/25/2022   11:47 AM 07/06/2022   10:51 AM 11/23/2021    1:03 PM 11/18/2020    9:46 AM 11/04/2020   11:21 AM  Fall Risk   Falls in the past year? 0 0 0 1 1  Number falls in past yr: 0 0 0 0 0  Injury with Fall? 0 0 0 0 1  Risk for fall due to : No Fall Risks No Fall Risks No Fall Risks History of fall(s)   Follow up Falls prevention discussed;Falls evaluation completed;Education provided Falls evaluation completed Falls evaluation completed Falls prevention discussed     MEDICARE RISK AT HOME:  Medicare Risk at Home - 11/25/22 1147     Any stairs in or around the home? Yes    If so, are there any without handrails? Yes    Home free of loose throw rugs in walkways, pet beds, electrical cords, etc? Yes    Adequate lighting  in your home to reduce risk of falls? Yes    Life alert? No    Use of a cane, walker or w/c? Yes    Grab bars in the bathroom? Yes    Shower chair or bench in shower? Yes    Elevated toilet seat or a handicapped toilet? No             TIMED UP AND GO:  Was the test performed?  No    Cognitive Function:        11/25/2022   11:49 AM 11/23/2021    1:11 PM  6CIT Screen  What Year? 0 points 0 points  What month? 0 points 0 points  What time? 0 points 0 points  Count back from 20 0 points 0 points  Months in reverse 0 points 0 points  Repeat phrase 0 points 4 points  Total Score 0 points 4 points    Immunizations Immunization History  Administered Date(s) Administered   COVID-19, mRNA, vaccine(Comirnaty)12 years and older 04/06/2022   Fluad Quad(high Dose 65+) 02/09/2019   Influenza Whole 02/24/2010   Influenza,inj,Quad PF,6+ Mos 05/01/2014, 03/22/2016, 02/16/2017, 01/04/2018, 01/23/2019, 03/14/2020, 04/06/2021, 04/06/2022   PFIZER Comirnaty(Gray Top)Covid-19 Tri-Sucrose Vaccine 11/04/2020   PFIZER(Purple Top)SARS-COV-2 Vaccination 08/17/2019, 09/12/2019, 03/14/2020   PNEUMOCOCCAL CONJUGATE-20 11/04/2020   Pfizer Covid-19 Vaccine Bivalent Booster 7yrs & up 04/06/2021   Pneumococcal Polysaccharide-23 06/13/2014, 07/27/2019  Td 07/03/2008    TDAP status: Due, Education has been provided regarding the importance of this vaccine. Advised may receive this vaccine at local pharmacy or Health Dept. Aware to provide a copy of the vaccination record if obtained from local pharmacy or Health Dept. Verbalized acceptance and understanding.  Flu Vaccine status: Up to date  Pneumococcal vaccine status: Up to date  Covid-19 vaccine status: Completed vaccines  Qualifies for Shingles Vaccine? Yes   Zostavax completed No   Shingrix Completed?: Yes   Screening Tests Health Maintenance  Topic Date Due   Zoster Vaccines- Shingrix (1 of 2) Never done   DTaP/Tdap/Td (2 - Tdap)  07/03/2018   COVID-19 Vaccine (7 - 2023-24 season) 08/05/2022   INFLUENZA VACCINE  12/16/2022   Diabetic kidney evaluation - Urine ACR  04/07/2023   FOOT EXAM  04/07/2023   HEMOGLOBIN A1C  04/21/2023   OPHTHALMOLOGY EXAM  06/10/2023   PAP SMEAR-Modifier  09/08/2023   Diabetic kidney evaluation - eGFR measurement  10/20/2023   Medicare Annual Wellness (AWV)  11/25/2023   MAMMOGRAM  12/29/2023   DEXA SCAN  10/27/2024   Colonoscopy  10/03/2030   Pneumonia Vaccine 33+ Years old  Completed   Hepatitis C Screening  Completed   HIV Screening  Completed   HPV VACCINES  Aged Out    Health Maintenance  Health Maintenance Due  Topic Date Due   Zoster Vaccines- Shingrix (1 of 2) Never done   DTaP/Tdap/Td (2 - Tdap) 07/03/2018   COVID-19 Vaccine (7 - 2023-24 season) 08/05/2022    Colorectal cancer screening: Type of screening: Colonoscopy. Completed 10/02/20. Repeat every 10 years  Mammogram status: Completed 12/28/21. Repeat every year MMG scheduled for 12/30/22  Bone Density status: Completed 10/28/22. Results reflect: Bone density results: NORMAL. Repeat every 2 years.  Lung Cancer Screening: (Low Dose CT Chest recommended if Age 16-80 years, 20 pack-year currently smoking OR have quit w/in 15years.) does not qualify.   Lung Cancer Screening Referral: n/a  Additional Screening:  Hepatitis C Screening: does qualify; Completed 04/14/15  Vision Screening: Recommended annual ophthalmology exams for early detection of glaucoma and other disorders of the eye. Is the patient up to date with their annual eye exam?  Yes  Who is the provider or what is the name of the office in which the patient attends annual eye exams? [facility] If pt is not established with a provider, would they like to be referred to a provider to establish care? No .   Dental Screening: Recommended annual dental exams for proper oral hygiene  Diabetic Foot Exam: Diabetic Foot Exam: Completed at  07/06/22 OV per  patient  Community Resource Referral / Chronic Care Management: CRR required this visit?  No   CCM required this visit?  No     Plan:     I have personally reviewed and noted the following in the patient's chart:   Medical and social history Use of alcohol, tobacco or illicit drugs  Current medications and supplements including opioid prescriptions. Patient is not currently taking opioid prescriptions. Functional ability and status Nutritional status Physical activity Advanced directives List of other physicians Hospitalizations, surgeries, and ER visits in previous 12 months Vitals Screenings to include cognitive, depression, and falls Referrals and appointments  In addition, I have reviewed and discussed with patient certain preventive protocols, quality metrics, and best practice recommendations. A written personalized care plan for preventive services as well as general preventive health recommendations were provided to patient.  Tora Kindred, CMA   11/25/2022   After Visit Summary: (MyChart) Due to this being a telephonic visit, the after visit summary with patients personalized plan was offered to patient via MyChart   Nurse Notes: Patient states she has had both Shingrix vaccines at [facility]

## 2022-11-25 NOTE — Patient Instructions (Signed)
Leah Hampton , Thank you for taking time to come for your Medicare Wellness Visit. I appreciate your ongoing commitment to your health goals. Please review the following plan we discussed and let me know if I can assist you in the future.   These are the goals we discussed:  Goals      Care Coordination Activites     Activities and task to complete in order to accomplish goals.   Call [facility] Medicare to follow up on your Health Navigator Keep all upcoming appointment discussed today Continue with compliance of taking medication prescribed by Doctor Complete Advance Directive packet,  Have advance directive notarized and provide a copy to provider office  Review EMMI educational information on Advance Directive. Look for an e-mail from [facility].      Education: nutrition     Interventions Today    Flowsheet Row Most Recent Value  Chronic Disease   Chronic disease during today's visit Diabetes, Hypertension (HTN)  General Interventions   General Interventions Discussed/Reviewed General Interventions Discussed, Doctor Visits, Community Resources  [provided contact number and encouraged to call RNCM with care coordination needs as needed,  advised to attend next scheduled visit with nutritionist with [facility] diabetes/nutrition program.]  Exercise Interventions   Exercise Discussed/Reviewed Physical Activity  Physical Activity Discussed/Reviewed Physical Activity Discussed  [reports very active walking the treadmill 5 days/week]  Education Interventions   Education Provided Provided Web-based Education  Provided Verbal Education On Nutrition, Community Resources  [place] emmi education: diabetes nutrition and healthy eating,  Nutrition: full program, provided information on teaching kitchen at [facility] and instructed on how to sign up on internet-provided contact information to Instructor]           Increase physical activity     Weight (lb) < 200 lb (90.7 kg)         This is a list of the screening recommended for you and due dates:  Health Maintenance  Topic Date Due   Zoster (Shingles) Vaccine (1 of 2) Never done   DTaP/Tdap/Td vaccine (2 - Tdap) 07/03/2018   COVID-19 Vaccine (7 - 2023-24 season) 08/05/2022   Flu Shot  12/16/2022   Yearly kidney health urinalysis for diabetes  04/07/2023   Complete foot exam   04/07/2023   Hemoglobin A1C  04/21/2023   Eye exam for diabetics  06/10/2023   Pap Smear  09/08/2023   Yearly kidney function blood test for diabetes  10/20/2023   Medicare Annual Wellness Visit  11/25/2023   Mammogram  12/29/2023   DEXA scan (bone density measurement)  10/27/2024   Colon Cancer Screening  10/03/2030   Pneumonia Vaccine  Completed   Hepatitis C Screening  Completed   HIV Screening  Completed   HPV Vaccine  Aged Out    Advanced directives: Advance directive discussed with you today. Even though you declined this today, please call our office should you change your mind, and we can give you the proper paperwork for you to fill out.   Conditions/risks identified: Complete the your advanced directives that you received in the mail this week and take them to your PCP to be scanned in your chart. Continue on your goals for weight loss.  Next appointment: Follow up in one year for your annual wellness visit 12/01/22   Preventive Care 65 Years and Older, Female Preventive care refers to lifestyle choices and visits with your health care provider that can promote health and wellness. What does preventive care include? A  yearly physical exam. This is also called an annual well check. Dental exams once or twice a year. Routine eye exams. Ask your health care provider how often you should have your eyes checked. Personal lifestyle choices, including: Daily care of your teeth and gums. Regular physical activity. Eating a healthy diet. Avoiding tobacco and drug use. Limiting alcohol use. Practicing safe sex. Taking low-dose  aspirin every day. Taking vitamin and mineral supplements as recommended by your health care provider. What happens during an annual well check? The services and screenings done by your health care provider during your annual well check will depend on your age, overall health, lifestyle risk factors, and family history of disease. Counseling  Your health care provider may ask you questions about your: Alcohol use. Tobacco use. Drug use. Emotional well-being. Home and relationship well-being. Sexual activity. Eating habits. History of falls. Memory and ability to understand (cognition). Work and work Astronomer. Reproductive health. Screening  You may have the following tests or measurements: Height, weight, and BMI. Blood pressure. Lipid and cholesterol levels. These may be checked every 5 years, or more frequently if you are over 72 years old. Skin check. Lung cancer screening. You may have this screening every year starting at age 62 if you have a 30-pack-year history of smoking and currently smoke or have quit within the past 15 years. Fecal occult blood test (FOBT) of the stool. You may have this test every year starting at age 32. Flexible sigmoidoscopy or colonoscopy. You may have a sigmoidoscopy every 5 years or a colonoscopy every 10 years starting at age 67. Hepatitis C blood test. Hepatitis B blood test. Sexually transmitted disease (STD) testing. Diabetes screening. This is done by checking your blood sugar (glucose) after you have not eaten for a while (fasting). You may have this done every 1-3 years. Bone density scan. This is done to screen for osteoporosis. You may have this done starting at age 70. Mammogram. This may be done every 1-2 years. Talk to your health care provider about how often you should have regular mammograms. Talk with your health care provider about your test results, treatment options, and if necessary, the need for more tests. Vaccines  Your  health care provider may recommend certain vaccines, such as: Influenza vaccine. This is recommended every year. Tetanus, diphtheria, and acellular pertussis (Tdap, Td) vaccine. You may need a Td booster every 10 years. Zoster vaccine. You may need this after age 58. Pneumococcal 13-valent conjugate (PCV13) vaccine. One dose is recommended after age 70. Pneumococcal polysaccharide (PPSV23) vaccine. One dose is recommended after age 64. Talk to your health care provider about which screenings and vaccines you need and how often you need them. This information is not intended to replace advice given to you by your health care provider. Make sure you discuss any questions you have with your health care provider. Document Released: 05/30/2015 Document Revised: 01/21/2016 Document Reviewed: 03/04/2015 Elsevier Interactive Patient Education  2017 [employer].  Fall Prevention in the Home Falls can cause injuries. They can happen to people of all ages. There are many things you can do to make your home safe and to help prevent falls. What can I do on the outside of my home? Regularly fix the edges of walkways and driveways and fix any cracks. Remove anything that might make you trip as you walk through a door, such as a raised step or threshold. Trim any bushes or trees on the path to your home. Use bright  outdoor lighting. Clear any walking paths of anything that might make someone trip, such as rocks or tools. Regularly check to see if handrails are loose or broken. Make sure that both sides of any steps have handrails. Any raised decks and porches should have guardrails on the edges. Have any leaves, snow, or ice cleared regularly. Use sand or salt on walking paths during winter. Clean up any spills in your garage right away. This includes oil or grease spills. What can I do in the bathroom? Use night lights. Install grab bars by the toilet and in the tub and shower. Do not use towel bars as  grab bars. Use non-skid mats or decals in the tub or shower. If you need to sit down in the shower, use a plastic, non-slip stool. Keep the floor dry. Clean up any water that spills on the floor as soon as it happens. Remove soap buildup in the tub or shower regularly. Attach bath mats securely with double-sided non-slip rug tape. Do not have throw rugs and other things on the floor that can make you trip. What can I do in the bedroom? Use night lights. Make sure that you have a light by your bed that is easy to reach. Do not use any sheets or blankets that are too big for your bed. They should not hang down onto the floor. Have a firm chair that has side arms. You can use this for support while you get dressed. Do not have throw rugs and other things on the floor that can make you trip. What can I do in the kitchen? Clean up any spills right away. Avoid walking on wet floors. Keep items that you use a lot in easy-to-reach places. If you need to reach something above you, use a strong step stool that has a grab bar. Keep electrical cords out of the way. Do not use floor polish or wax that makes floors slippery. If you must use wax, use non-skid floor wax. Do not have throw rugs and other things on the floor that can make you trip. What can I do with my stairs? Do not leave any items on the stairs. Make sure that there are handrails on both sides of the stairs and use them. Fix handrails that are broken or loose. Make sure that handrails are as long as the stairways. Check any carpeting to make sure that it is firmly attached to the stairs. Fix any carpet that is loose or worn. Avoid having throw rugs at the top or bottom of the stairs. If you do have throw rugs, attach them to the floor with carpet tape. Make sure that you have a light switch at the top of the stairs and the bottom of the stairs. If you do not have them, ask someone to add them for you. What else can I do to help prevent  falls? Wear shoes that: Do not have high heels. Have rubber bottoms. Are comfortable and fit you well. Are closed at the toe. Do not wear sandals. If you use a stepladder: Make sure that it is fully opened. Do not climb a closed stepladder. Make sure that both sides of the stepladder are locked into place. Ask someone to hold it for you, if possible. Clearly mark and make sure that you can see: Any grab bars or handrails. First and last steps. Where the edge of each step is. Use tools that help you move around (mobility aids) if they are needed. These  include: Canes. Walkers. Scooters. Crutches. Turn on the lights when you go into a dark area. Replace any light bulbs as soon as they burn out. Set up your furniture so you have a clear path. Avoid moving your furniture around. If any of your floors are uneven, fix them. If there are any pets around you, be aware of where they are. Review your medicines with your doctor. Some medicines can make you feel dizzy. This can increase your chance of falling. Ask your doctor what other things that you can do to help prevent falls. This information is not intended to replace advice given to you by your health care provider. Make sure you discuss any questions you have with your health care provider. Document Released: 02/27/2009 Document Revised: 10/09/2015 Document Reviewed: 06/07/2014 Elsevier Interactive Patient Education  2017 [employer].   Healthy Eating, Adult Healthy eating may help you get and keep a healthy body weight, reduce the risk of chronic disease, and live a long and productive life. It is important to follow a healthy eating pattern. Your nutritional and calorie needs should be met mainly by different nutrient-rich foods. What are tips for following this plan? Reading food labels Read labels and choose the following: Reduced or low sodium products. Juices with 100% fruit juice. Foods with low saturated fats (<3 g per  serving) and high polyunsaturated and monounsaturated fats. Foods with whole grains, such as whole wheat, cracked wheat, brown rice, and wild rice. Whole grains that are fortified with folic acid. This is recommended for females who are pregnant or who want to become pregnant. Read labels and do not eat or drink the following: Foods or drinks with added sugars. These include foods that contain brown sugar, corn sweetener, corn syrup, dextrose, fructose, glucose, high-fructose corn syrup, honey, invert sugar, lactose, malt syrup, maltose, molasses, raw sugar, sucrose, trehalose, or turbinado sugar. Limit your intake of added sugars to less than 10% of your total daily calories. Do not eat more than the following amounts of added sugar per day: 6 teaspoons (25 g) for females. 9 teaspoons (38 g) for males. Foods that contain processed or refined starches and grains. Refined grain products, such as white flour, degermed cornmeal, white bread, and white rice. Shopping Choose nutrient-rich snacks, such as vegetables, whole fruits, and nuts. Avoid high-calorie and high-sugar snacks, such as potato chips, fruit snacks, and candy. Use oil-based dressings and spreads on foods instead of solid fats such as butter, margarine, sour cream, or cream cheese. Limit pre-made sauces, mixes, and "instant" products such as flavored rice, instant noodles, and ready-made pasta. Try more plant-protein sources, such as tofu, tempeh, black beans, edamame, lentils, nuts, and seeds. Explore eating plans such as the Mediterranean diet or vegetarian diet. Try heart-healthy dips made with beans and healthy fats like hummus and guacamole. Vegetables go great with these. Cooking Use oil to saut or stir-fry foods instead of solid fats such as butter, margarine, or lard. Try baking, boiling, grilling, or broiling instead of frying. Remove the fatty part of meats before cooking. Steam vegetables in water or broth. Meal  planning  At meals, imagine dividing your plate into fourths: One-half of your plate is fruits and vegetables. One-fourth of your plate is whole grains. One-fourth of your plate is protein, especially lean meats, poultry, eggs, tofu, beans, or nuts. Include low-fat dairy as part of your daily diet. Lifestyle Choose healthy options in all settings, including home, work, school, restaurants, or stores. Prepare your food safely: Wash  your hands after handling raw meats. Where you prepare food, keep surfaces clean by regularly washing with hot, soapy water. Keep raw meats separate from ready-to-eat foods, such as fruits and vegetables. Cook seafood, meat, poultry, and eggs to the recommended temperature. Get a food thermometer. Store foods at safe temperatures. In general: Keep cold foods at 6F (4.4C) or below. Keep hot foods at 16F (60C) or above. Keep your freezer at [facility] (-17.8C) or below. Foods are not safe to eat if they have been between the temperatures of 40-16F (4.4-60C) for more than 2 hours. What foods should I eat? Fruits Aim to eat 1-2 cups of fresh, canned (in natural juice), or frozen fruits each day. One cup of fruit equals 1 small apple, 1 large banana, 8 large strawberries, 1 cup (237 g) canned fruit,  cup (82 g) dried fruit, or 1 cup (240 mL) 100% juice. Vegetables Aim to eat 2-4 cups of fresh and frozen vegetables each day, including different varieties and colors. One cup of vegetables equals 1 cup (91 g) broccoli or cauliflower florets, 2 medium carrots, 2 cups (150 g) raw, leafy greens, 1 large tomato, 1 large bell pepper, 1 large sweet potato, or 1 medium white potato. Grains Aim to eat 5-10 ounce-equivalents of whole grains each day. Examples of 1 ounce-equivalent of grains include 1 slice of bread, 1 cup (40 g) ready-to-eat cereal, 3 cups (24 g) popcorn, or  cup (93 g) cooked rice. Meats and other proteins Try to eat 5-7 ounce-equivalents of protein each  day. Examples of 1 ounce-equivalent of protein include 1 egg,  oz nuts (12 almonds, 24 pistachios, or 7 walnut halves), 1/4 cup (90 g) cooked beans, 6 tablespoons (90 g) hummus or 1 tablespoon (16 g) peanut butter. A cut of meat or fish that is the size of a deck of cards is about 3-4 ounce-equivalents (85 g). Of the protein you eat each week, try to have at least 8 sounce (227 g) of seafood. This is about 2 servings per week. This includes salmon, trout, herring, sardines, and anchovies. Dairy Aim to eat 3 cup-equivalents of fat-free or low-fat dairy each day. Examples of 1 cup-equivalent of dairy include 1 cup (240 mL) milk, 8 ounces (250 g) yogurt, 1 ounces (44 g) natural cheese, or 1 cup (240 mL) fortified soy milk. Fats and oils Aim for about 5 teaspoons (21 g) of fats and oils per day. Choose monounsaturated fats, such as canola and olive oils, mayonnaise made with olive oil or avocado oil, avocados, peanut butter, and most nuts, or polyunsaturated fats, such as sunflower, corn, and soybean oils, walnuts, pine nuts, sesame seeds, sunflower seeds, and flaxseed. Beverages Aim for 6 eight-ounce glasses of water per day. Limit coffee to 3-5 eight-ounce cups per day. Limit caffeinated beverages that have added calories, such as soda and energy drinks. If you drink alcohol: Limit how much you have to: 0-1 drink a day if you are female. 0-2 drinks a day if you are female. Know how much alcohol is in your drink. In the [place] one drink is one 12 oz bottle of beer (355 mL), one 5 oz glass of wine (148 mL), or one 1 oz glass of hard liquor (44 mL). Seasoning and other foods Try not to add too much salt to your food. Try using herbs and spices instead of salt. Try not to add sugar to food. This information is based on [place] nutrition guidelines. To learn more, visit [employer]. Exact amounts may  vary. You may need different amounts. This information is not intended to replace advice given to you  by your health care provider. Make sure you discuss any questions you have with your health care provider. Document Revised: 02/01/2022 Document Reviewed: 02/01/2022 Elsevier Patient Education  2024 [employer].

## 2022-12-09 ENCOUNTER — Telehealth: Payer: Self-pay

## 2022-12-09 ENCOUNTER — Ambulatory Visit: Payer: Self-pay

## 2022-12-09 NOTE — Patient Outreach (Signed)
  Care Coordination   12/09/2022 Name: Leah Hampton MRN: [id number] DOB: [birth year]   Care Coordination Outreach Attempts:  An unsuccessful telephone outreach was attempted for a scheduled appointment today.  Follow Up Plan:  Additional outreach attempts will be made to offer the patient care coordination information and services.   Encounter Outcome:  No Answer   Care Coordination Interventions:  No, not indicated    Kathyrn Sheriff, RN, MSN, BSN, CCM [facility] Care Coordinator [phone]

## 2022-12-09 NOTE — Patient Outreach (Signed)
  Care Coordination   Follow Up Visit Note   12/09/2022 Name: Leah Hampton MRN: [id number] DOB: 02-16-1956  Leah Hampton is a 67 y.o. year old female who sees Sandford Craze, NP for primary care. I spoke with  [name] by phone today.  What matters to the patients health and wellness today?  Reports weights at home, cubic cycle machine and fitness, eating healthier. Reports has 8-9 more pounds to loose plan. Down o 227 now has to get to 218 in order to have knee surgery. Has an appointment with Dr. Andrey Campanile who completed her weight loss surgery.  Goals Addressed             This Visit's Progress    Education: nutrition       Interventions Today    Flowsheet Row Most Recent Value  General Interventions   General Interventions Discussed/Reviewed General Interventions Reviewed, Doctor Visits  Doctor Visits Discussed/Reviewed Doctor Visits Reviewed  [upcoming appointments reviewed]  Exercise Interventions   Exercise Discussed/Reviewed Weight Managment  Weight Management Weight loss  [encouraged patient to continue to be active and exercise per provider recommendation, eat healthy, appointment with weight management providers reviewed. provided positive feedback.]  Education Interventions   Education Provided Provided Education  Provided Verbal Education On Other  [confirmed patient has received emmi education and begun to view material. no questions at this time]            SDOH assessments and interventions completed:  No  Care Coordination Interventions:  Yes, provided   Follow up plan: Follow up call scheduled for 01/10/23    Encounter Outcome:  Pt. Visit Completed   Kathyrn Sheriff, RN, MSN, BSN, CCM [facility] Care Coordinator [phone]

## 2022-12-09 NOTE — Patient Instructions (Signed)
Visit Information  Thank you for taking time to visit with me today. Please don't hesitate to contact me if I can be of assistance to you.   Following are the goals we discussed today:   Goals Addressed             This Visit's Progress    Education: nutrition       Interventions Today    Flowsheet Row Most Recent Value  General Interventions   General Interventions Discussed/Reviewed General Interventions Reviewed, Doctor Visits  Doctor Visits Discussed/Reviewed Doctor Visits Reviewed  [upcoming appointments reviewed]  Exercise Interventions   Exercise Discussed/Reviewed Weight Managment  Weight Management Weight loss  [encouraged patient to continue to be active and exercise per provider recommendation, eat healthy, appointment with weight management providers reviewed. provided positive feedback.]  Education Interventions   Education Provided Provided Education  Provided Verbal Education On Other  [confirmed patient has received emmi education and begun to view material. no questions at this time]            Our next appointment is by telephone on 01/10/23 at 3:30 pm  Please call the care guide team at [phone] if you need to cancel or reschedule your appointment.   If you are experiencing a Mental Health or Behavioral Health Crisis or need someone to talk to, please call the Suicide and Crisis Lifeline: 44  Kathyrn Sheriff, RN, MSN, BSN, CCM [facility] Care Coordinator [phone]

## 2022-12-15 ENCOUNTER — Other Ambulatory Visit (HOSPITAL_BASED_OUTPATIENT_CLINIC_OR_DEPARTMENT_OTHER): Payer: Self-pay

## 2022-12-20 ENCOUNTER — Ambulatory Visit: Payer: Self-pay | Admitting: Licensed Clinical Social Worker

## 2022-12-20 NOTE — Patient Instructions (Signed)
Social Work Visit Information  Thank you for taking time to visit with me today. Please don't hesitate to contact me if I can be of assistance to you.   Following are the goals we discussed today:   Goals Addressed             This Visit's Progress    COMPLETED: Care Coordination Activites No f/u       Activities and task to complete in order to accomplish goals.   Call [facility] Medicare to follow up on your Health Navigator Keep all upcoming appointment discussed today Continue with compliance of taking medication prescribed by Doctor Continue to work on your Advance Directive packet,  Have advance directive notarized and provide a copy to provider office          Patient does not desire continued follow-up by social work. They will contact the office if needed  Please call the care guide team at [phone] if you need to cancel or reschedule your appointment.   If you or anyone you know are experiencing a Mental Health or Behavioral Health Crisis or need someone to talk to, please call the Suicide and Crisis Lifeline: 988 call the [place] National Suicide Prevention Lifeline: 530-704-8523 or TTY: (619) 785-1886 TTY (270)408-8700) to talk to a trained counselor call 1-800-273-TALK (toll free, 24 hour hotline) go to [facility] [address] [place] [phone])   Patient verbalizes understanding of instructions and care plan provided today and agrees to view in MyChart. Active MyChart status and patient understanding of how to access instructions and care plan via MyChart confirmed with patient.       Sammuel Hines, LCSW Social Work Care Coordination  [facility] Emmie Niemann [employer] [phone]

## 2022-12-20 NOTE — Patient Outreach (Signed)
  Care Coordination  Follow Up Visit Note   12/20/2022 Name: CASIE MENGE MRN: [id number] DOB: 1956-02-28  JORGA PERRICONE is a 67 y.o. year old female who sees Sandford Craze, NP for primary care. I spoke with  [name] by phone today.  What matters to the patients health and wellness today?    Patient reports doing well, had great visit with RN care manager and will continue working with her.  No new goals identified for LCSW during this encounter. Patient will continue to work on [facility] and advance directive.   Goals Addressed             This Visit's Progress    COMPLETED: Care Coordination Activites No f/u       Activities and task to complete in order to accomplish goals.   Call [facility] Medicare to follow up on your Health Navigator Keep all upcoming appointment discussed today Continue with compliance of taking medication prescribed by Doctor Continue to work on your Advance Directive packet,  Have advance directive notarized and provide a copy to provider office         SDOH assessments and interventions completed:  No   Care Coordination Interventions:  Yes, provided  Interventions Today    Flowsheet Row Most Recent Value  Chronic Disease   Chronic disease during today's visit Hypertension (HTN), Diabetes  General Interventions   General Interventions Discussed/Reviewed General Interventions Reviewed  Advanced Directive Interventions   Advanced Directives Discussed/Reviewed Advanced Directives Reviewed  Mosetta Pigeon documents will complete,   has no questions at this time]       Follow up plan: No further intervention required.   Encounter Outcome:  Pt. Visit Completed   Sammuel Hines, LCSW Social Work Care Coordination  [facility] Emmie Niemann [employer] [phone]

## 2022-12-22 DIAGNOSIS — K439 Ventral hernia without obstruction or gangrene: Secondary | ICD-10-CM | POA: Diagnosis not present

## 2022-12-22 DIAGNOSIS — M1711 Unilateral primary osteoarthritis, right knee: Secondary | ICD-10-CM | POA: Diagnosis not present

## 2022-12-22 DIAGNOSIS — Z9884 Bariatric surgery status: Secondary | ICD-10-CM | POA: Diagnosis not present

## 2022-12-22 DIAGNOSIS — G4733 Obstructive sleep apnea (adult) (pediatric): Secondary | ICD-10-CM | POA: Diagnosis not present

## 2022-12-22 DIAGNOSIS — I10 Essential (primary) hypertension: Secondary | ICD-10-CM | POA: Diagnosis not present

## 2022-12-22 DIAGNOSIS — E118 Type 2 diabetes mellitus with unspecified complications: Secondary | ICD-10-CM | POA: Diagnosis not present

## 2022-12-30 ENCOUNTER — Encounter (INDEPENDENT_AMBULATORY_CARE_PROVIDER_SITE_OTHER): Payer: Self-pay

## 2022-12-30 ENCOUNTER — Ambulatory Visit
Admission: RE | Admit: 2022-12-30 | Discharge: 2022-12-30 | Disposition: A | Discharge status: Home / Self Care | Payer: 59 | Source: Ambulatory Visit | Attending: Obstetrics & Gynecology | Admitting: Obstetrics & Gynecology

## 2022-12-30 DIAGNOSIS — Z1231 Encounter for screening mammogram for malignant neoplasm of breast: Secondary | ICD-10-CM

## 2023-01-04 ENCOUNTER — Other Ambulatory Visit: Payer: Self-pay | Admitting: Family

## 2023-01-05 ENCOUNTER — Ambulatory Visit: Payer: 59 | Admitting: Skilled Nursing Facility1

## 2023-01-10 ENCOUNTER — Ambulatory Visit: Payer: Self-pay

## 2023-01-10 NOTE — Patient Instructions (Signed)
Visit Information  Thank you for taking time to visit with me today. Please don't hesitate to contact me if I can be of assistance to you.   Following are the goals we discussed today:  Continue to take medications as prescribed. Continue to attend provider visits as scheduled Continue to eat healthy, lean meats, vegetables, fruits, avoid saturated and transfats Review educational material and contact RN Care Coordinator if any questions  Goals Addressed             This Visit's Progress    Education: nutrition       Interventions Today    Flowsheet Row Most Recent Value  Chronic Disease   Chronic disease during today's visit Hypertension (HTN), Diabetes  General Interventions   General Interventions Discussed/Reviewed General Interventions Reviewed, Doctor Visits  Doctor Visits Discussed/Reviewed Doctor Visits Reviewed, Specialist  PCP/Specialist Visits Compliance with follow-up visit  [reviewed upcoming follow up appointments]  Education Interventions   Education Provided Provided Education  Provided Verbal Education On Other  [confirmed patient received previous educations sent. per patient,has not reviewed yet. encouraged to review at her leisure and contact RNCM at her leisure.]  Nutrition Interventions   Nutrition Discussed/Reviewed Nutrition Reviewed  Pharmacy Interventions   Pharmacy Dicussed/Reviewed Pharmacy Topics Reviewed            Our next appointment is by telephone on 02/09/23 at 3:30 pm  Please call the care guide team at [phone] if you need to cancel or reschedule your appointment.   If you are experiencing a Mental Health or Behavioral Health Crisis or need someone to talk to, please call the Suicide and Crisis Lifeline: 988 call the [place] National Suicide Prevention Lifeline: 607-511-6198 or TTY: 563-093-1457 TTY 585-750-7508) to talk to a trained counselor call 1-800-273-TALK (toll free, 24 hour hotline)  Kathyrn Sheriff, RN, MSN, BSN, CCM Care  Management Coordinator [phone]

## 2023-01-10 NOTE — Patient Outreach (Signed)
  Care Coordination   Follow Up Visit Note   01/10/2023 Name: Leah Hampton MRN: [id number] DOB: [birth year]  Leah Hampton is a 67 y.o. year old female who sees Sandford Craze, NP for primary care. I spoke with  Leah Hampton by phone today.  What matters to the patients health and wellness today?  Ms. Treichel reports visit with surgeon on 12/22/22. She continues to exercise routinely and work on losing weight.  Goals Addressed             This Visit's Progress    Education: nutrition       Interventions Today    Flowsheet Row Most Recent Value  Chronic Disease   Chronic disease during today's visit Hypertension (HTN), Diabetes  General Interventions   General Interventions Discussed/Reviewed General Interventions Reviewed, Doctor Visits  Doctor Visits Discussed/Reviewed Doctor Visits Reviewed, Specialist  PCP/Specialist Visits Compliance with follow-up visit  [reviewed upcoming follow up appointments]  Education Interventions   Education Provided Provided Education  Provided Verbal Education On Other  [confirmed patient received previous educations sent. per patient,has not reviewed yet. encouraged to review at her leisure and contact RNCM at her leisure.]  Nutrition Interventions   Nutrition Discussed/Reviewed Nutrition Reviewed  Pharmacy Interventions   Pharmacy Dicussed/Reviewed Pharmacy Topics Reviewed            SDOH assessments and interventions completed:    Care Coordination Interventions:     Follow up plan: Follow up call scheduled for 02/09/23    Encounter Outcome:  Pt. Visit Completed   Kathyrn Sheriff, RN, MSN, BSN, CCM Care Management Coordinator [phone]

## 2023-01-12 ENCOUNTER — Other Ambulatory Visit (HOSPITAL_BASED_OUTPATIENT_CLINIC_OR_DEPARTMENT_OTHER): Payer: Self-pay

## 2023-01-21 ENCOUNTER — Telehealth: Payer: Self-pay | Admitting: Family

## 2023-01-21 ENCOUNTER — Ambulatory Visit (INDEPENDENT_AMBULATORY_CARE_PROVIDER_SITE_OTHER): Payer: 59 | Admitting: Family

## 2023-01-21 ENCOUNTER — Other Ambulatory Visit (HOSPITAL_BASED_OUTPATIENT_CLINIC_OR_DEPARTMENT_OTHER): Payer: Self-pay

## 2023-01-21 VITALS — BP 107/59 | HR 63 | Temp 97.5°F | Resp 16 | Wt 226.0 lb

## 2023-01-21 DIAGNOSIS — K219 Gastro-esophageal reflux disease without esophagitis: Secondary | ICD-10-CM | POA: Diagnosis not present

## 2023-01-21 DIAGNOSIS — G479 Sleep disorder, unspecified: Secondary | ICD-10-CM | POA: Diagnosis not present

## 2023-01-21 DIAGNOSIS — Z7985 Long-term (current) use of injectable non-insulin antidiabetic drugs: Secondary | ICD-10-CM | POA: Diagnosis not present

## 2023-01-21 DIAGNOSIS — J45909 Unspecified asthma, uncomplicated: Secondary | ICD-10-CM

## 2023-01-21 DIAGNOSIS — E119 Type 2 diabetes mellitus without complications: Secondary | ICD-10-CM

## 2023-01-21 DIAGNOSIS — A6000 Herpesviral infection of urogenital system, unspecified: Secondary | ICD-10-CM

## 2023-01-21 DIAGNOSIS — Z23 Encounter for immunization: Secondary | ICD-10-CM

## 2023-01-21 DIAGNOSIS — E782 Mixed hyperlipidemia: Secondary | ICD-10-CM | POA: Diagnosis not present

## 2023-01-21 LAB — BASIC METABOLIC PANEL
BUN: 15 mg/dL (ref 6–23)
CO2: 30 meq/L (ref 19–32)
Calcium: 9.7 mg/dL (ref 8.4–10.5)
Chloride: 106 meq/L (ref 96–112)
Creatinine, Ser: 0.78 mg/dL (ref 0.40–1.20)
GFR: 79.77 mL/min (ref >60.00)
Glucose, Bld: 77 mg/dL (ref 70–99)
Potassium: 4.1 meq/L (ref 3.5–5.1)
Sodium: 143 meq/L (ref 135–145)

## 2023-01-21 LAB — HEMOGLOBIN A1C: Hgb A1c MFr Bld: 5.8 % (ref 4.6–6.5)

## 2023-01-21 MED ORDER — TIRZEPATIDE 5 MG/0.5ML ~~LOC~~ SOAJ
5.0000 mg | SUBCUTANEOUS | 2 refills | Status: DC
  Filled 2023-01-21: qty 2, 28d supply, fill #0
  Filled 2023-02-07 – 2023-02-11 (×2): qty 2, 28d supply, fill #1
  Filled 2023-03-10: qty 2, 28d supply, fill #2
  Filled 2023-04-07: qty 2, 28d supply, fill #3
  Filled 2023-04-26 – 2023-04-28 (×2): qty 2, 28d supply, fill #4

## 2023-01-21 NOTE — Telephone Encounter (Signed)
Please request shingrix dates from [facility] on [address].

## 2023-01-21 NOTE — Progress Notes (Signed)
Subjective:     Patient ID: Leah Hampton, female    DOB: Jul 13, 1955, 67 y.o.   MRN: [id number]  Chief Complaint  Patient presents with   Hypertension    Here for follow up   Insomnia    Complains of trouble sleeping, feeling tired during the day    HPI  Discussed the use of AI scribe software for clinical note transcription with the patient, who gave verbal consent to proceed.  History of Present Illness   The patient presents for a routine follow-up. She reports no issues with her current medication regimen, which includes pravastatin for cholesterol management, amlodipine and losartan for blood pressure control, and Valtrex for herpes prevention. She has discontinued omeprazole due to a lack of heartburn symptoms. She also takes [facility] for an unspecified condition, which occasionally causes itching. Her blood pressure is well-controlled, and her last A1c, checked three months ago, was 5.8. She uses albuterol two to three times a week, with increased usage during certain seasons. She also reports difficulty staying asleep, often waking up around 3 or 4 in the morning. She occasionally nap during the day. She has a history of mild sleep apnea but does not use a machine. She also reports knee pain, which she manages with ice and Voltaren gel.     Wt Readings from Last 3 Encounters:  01/21/23 226 lb (102.5 kg)  11/25/22 227 lb (103 kg)  10/20/22 234 lb (106.1 kg)   Lab Results  Component Value Date   CHOL 175 07/06/2022   HDL 60.30 07/06/2022   LDLCALC 102 (H) 07/06/2022   TRIG 63.0 07/06/2022   CHOLHDL 3 07/06/2022    Lab Results  Component Value Date   HGBA1C 5.8 10/20/2022   HGBA1C 6.0 07/06/2022   HGBA1C 6.2 04/06/2022   Lab Results  Component Value Date   MICROALBUR <0.7 04/06/2022   LDLCALC 102 (H) 07/06/2022   CREATININE 0.78 10/20/2022       Health Maintenance Due  Topic Date Due   Zoster Vaccines- Shingrix (1 of 2) Never done   DTaP/Tdap/Td (2 -  Tdap) 07/03/2018   COVID-19 Vaccine (7 - 2023-24 season) 01/16/2023    Past Medical History:  Diagnosis Date   Bronchitis    Chronic bronchitis (HCC)    Cystitis    Diabetes type 2, controlled (HCC)    Hematuria    urologice eval, Dr Lindley Magnus   History of chest pain    Hyperbilirubinemia    Hyperlipemia    Hypertelorism    Hypertension    Leukocytosis    Obesity    Osteoarthritis    right knee   Sleep apnea 2012   slight    Vaginal Pap smear, abnormal    Vitamin D deficiency 02/09/2018    Past Surgical History:  Procedure Laterality Date   CERVICAL POLYPECTOMY  07/15/2020   Procedure: POLYPECTOMY with Myosure;  Surgeon: Willodean Rosenthal, MD;  Location: [facility] OR;  Service: Gynecology;;   COLONOSCOPY WITH PROPOFOL N/A 03/27/2015   Procedure: COLONOSCOPY WITH PROPOFOL;  Surgeon: Rachael Fee, MD;  Location: [facility];  Service: Endoscopy;  Laterality: N/A;   COLONOSCOPY WITH PROPOFOL N/A 10/02/2020   Procedure: COLONOSCOPY WITH PROPOFOL;  Surgeon: Rachael Fee, MD;  Location: [facility];  Service: Endoscopy;  Laterality: N/A;   COLPOSCOPY N/A 07/15/2020   Procedure: COLPOSCOPY;  Surgeon: Willodean Rosenthal, MD;  Location: [facility] OR;  Service: Gynecology;  Laterality: N/A;   DILATATION & CURETTAGE/HYSTEROSCOPY WITH MYOSURE N/A 01/05/2017  Procedure: DILATATION & CURETTAGE/HYSTEROSCOPY WITH MYOSURE;  Surgeon: Geryl Rankins, MD;  Location: [facility] ORS;  Service: Gynecology;  Laterality: N/A;  PostMenopausal Bleeding   HYSTEROSCOPY WITH D & C N/A 05/22/2019   Procedure: DILATATION AND CURETTAGE /HYSTEROSCOPY, POLYPECTOMY WITH MYOSURE;  Surgeon: Willodean Rosenthal, MD;  Location: [facility];  Service: Gynecology;  Laterality: N/A;   HYSTEROSCOPY WITH D & C N/A 07/15/2020   Procedure: DILATATION AND CURETTAGE /HYSTEROSCOPY;  Surgeon: Willodean Rosenthal, MD;  Location: [facility] OR;  Service: Gynecology;  Laterality: N/A;   KNEE ARTHROSCOPY  04/2007   left knee   lap  sleeve     TOTAL KNEE ARTHROPLASTY Left 10/2008   Dr Irine Seal   UPPER GI ENDOSCOPY N/A 10/14/2020   Procedure: UPPER GI ENDOSCOPY;  Surgeon: Gaynelle Adu, MD;  Location: [facility];  Service: General;  Laterality: N/A;    Family History  Problem Relation Age of Onset   Heart failure Mother    Arthritis Mother    Hypertension Mother    Stroke Mother    Diabetes Mellitus II Mother    Heart attack Father 63   Diabetes Other    Arthritis Other    Stroke Other    Coronary artery disease Other    Hypertension Sister        x 2   CVA Sister    CAD Sister 54   Diabetes Mellitus II Sister    Cirrhosis Brother    Alcohol abuse Brother    Colon cancer Neg Hx     Social History   Socioeconomic History   Marital status: Single    Spouse name: Not on file   Number of children: 2   Years of education: Not on file   Highest education level: Not on file  Occupational History   Occupation: disabled  Tobacco Use   Smoking status: Never   Smokeless tobacco: Never  Vaping Use   Vaping status: Never Used  Substance and Sexual Activity   Alcohol use: No   Drug use: No   Sexual activity: Not Currently  Other Topics Concern   Not on file  Social History Narrative   Regular exercise:  No   Lives alone   Has 2 grown sons, both local.  1 grandson   On disability due to her knee pain   Completed 12th grade   Enjoys television   No pets.    Social Determinants of Health   Financial Resource Strain: Low Risk  (11/25/2022)   Overall Financial Resource Strain (CARDIA)    Difficulty of Paying Living Expenses: Not hard at all  Food Insecurity: No Food Insecurity (11/25/2022)   Hunger Vital Sign    Worried About Running Out of Food in the Last Year: Never true    Ran Out of Food in the Last Year: Never true  Transportation Needs: No Transportation Needs (11/25/2022)   PRAPARE - Administrator, Civil Service (Medical): No    Lack of Transportation (Non-Medical): No  Physical  Activity: Sufficiently Active (11/25/2022)   Exercise Vital Sign    Days of Exercise per Week: 5 days    Minutes of Exercise per Session: 50 min  Stress: No Stress Concern Present (11/25/2022)   [employer] of Occupational Health - Occupational Stress Questionnaire    Feeling of Stress : Not at all  Social Connections: Moderately Integrated (11/25/2022)   Social Connection and Isolation Panel [NHANES]    Frequency of Communication with Friends and Family: More than three  times a week    Frequency of Social Gatherings with Friends and Family: Three times a week    Attends Religious Services: More than 4 times per year    Active Member of Clubs or Organizations: Yes    Attends Banker Meetings: More than 4 times per year    Marital Status: Never married  Intimate Partner Violence: Not At Risk (11/25/2022)   Humiliation, Afraid, Rape, and Kick questionnaire    Fear of Current or Ex-Partner: No    Emotionally Abused: No    Physically Abused: No    Sexually Abused: No    Outpatient Medications Prior to Visit  Medication Sig Dispense Refill   albuterol (VENTOLIN HFA) 108 (90 Base) MCG/ACT inhaler USE 2 INHALATIONS BY MOUTH EVERY 6 HOURS AS NEEDED FOR WHEEZING  OR SHORTNESS OF BREATH 54 g 3   amLODipine (NORVASC) 5 MG tablet TAKE 1 TABLET BY MOUTH DAILY 100 tablet 1   beclomethasone (QVAR REDIHALER) 80 MCG/ACT inhaler USE 1 INHALATION BY MOUTH INTO  THE LUNGS TWICE DAILY 31.8 g 1   diclofenac Sodium (VOLTAREN) 1 % GEL Apply 2 g topically 4 (four) times daily. 100 g 1   losartan (COZAAR) 100 MG tablet TAKE 1 TABLET BY MOUTH DAILY 100 tablet 1   Multiple Vitamin (MULTIVITAMIN WITH MINERALS) TABS tablet Take 1 tablet by mouth daily. Vitafusion     pravastatin (PRAVACHOL) 80 MG tablet TAKE 1 TABLET BY MOUTH DAILY 100 tablet 2   psyllium (METAMUCIL) 58.6 % powder Take 1 packet by mouth daily as needed (constipation).     valACYclovir (VALTREX) 1000 MG tablet TAKE 1 TABLET BY MOUTH  DAILY 100 tablet 2   omeprazole (PRILOSEC) 20 MG capsule TAKE 1 CAPSULE BY MOUTH DAILY (Patient not taking: Reported on 01/10/2023) 100 capsule 2   tirzepatide (MOUNJARO) 5 MG/0.5ML Pen Inject 5 mg into the skin once a week. 2 mL 2   No facility-administered medications prior to visit.    Allergies  Allergen Reactions   Sulfamethoxazole-Trimethoprim Itching   Sulfamethoxazole-Trimethoprim [Bactrim] Itching   Codeine Nausea And Vomiting and Anxiety    ROS See HPI    Objective:    Physical Exam Constitutional:      General: She is not in acute distress.    Appearance: Normal appearance. She is well-developed.  HENT:     Head: Normocephalic and atraumatic.     Right Ear: External ear normal.     Left Ear: External ear normal.  Eyes:     General: No scleral icterus. Neck:     Thyroid: No thyromegaly.  Cardiovascular:     Rate and Rhythm: Normal rate and regular rhythm.     Heart sounds: Normal heart sounds. No murmur heard. Pulmonary:     Effort: Pulmonary effort is normal. No respiratory distress.     Breath sounds: Normal breath sounds. No wheezing.  Musculoskeletal:     Cervical back: Neck supple.  Skin:    General: Skin is warm and dry.  Neurological:     Mental Status: She is alert and oriented to person, place, and time.  Psychiatric:        Mood and Affect: Mood normal.        Behavior: Behavior normal.        Thought Content: Thought content normal.        Judgment: Judgment normal.      BP (!) 107/59 (BP Location: Right Arm, Patient Position: Sitting, Cuff Size: Large)  Pulse 63   Temp (!) 97.5 F (36.4 C) (Oral)   Resp 16   Wt 226 lb (102.5 kg)   LMP 11/10/2011   SpO2 100%   BMI 40.03 kg/m  Wt Readings from Last 3 Encounters:  01/21/23 226 lb (102.5 kg)  11/25/22 227 lb (103 kg)  10/20/22 234 lb (106.1 kg)       Assessment & Plan:   Problem List Items Addressed This Visit       Unprioritized   Uncomplicated asthma     Using albuterol  2-3 times per week. Seasonal variation noted. -Continue current management.       Type 2 diabetes mellitus without complication, without long-term current use of insulin (HCC) - Primary     Last A1c check 3 months ago was 5.8. Patient reports occasional itching with Mounjaro. -Repeat A1c today. -Continue Mounjaro.      Relevant Medications   tirzepatide (MOUNJARO) 5 MG/0.5ML Pen   Other Relevant Orders   HgB A1c   Basic Metabolic Panel (BMET)   Sleep disturbance     Using albuterol 2-3 times per week. Seasonal variation noted. -Continue current management.       Mixed hyperlipidemia     Stable on pravastatin. Last cholesterol check in February was within normal limits. -Continue pravastatin.      Genital herpes     On daily Valtrex for prevention. No reported breakouts. -Continue Valtrex daily.      Gastroesophageal reflux disease     No longer taking omeprazole. No reported heartburn symptoms. -Continue off omeprazole.      Other Visit Diagnoses     Needs flu shot       Relevant Orders   Flu Vaccine Trivalent High Dose (Fluad) (Completed)       I have discontinued Leah Hampton's omeprazole. I am also having her maintain her multivitamin with minerals, psyllium, diclofenac Sodium, albuterol, pravastatin, valACYclovir, amLODipine, Qvar RediHaler, losartan, and tirzepatide.  Meds ordered this encounter  Medications   tirzepatide (MOUNJARO) 5 MG/0.5ML Pen    Sig: Inject 5 mg into the skin once a week.    Dispense:  6 mL    Refill:  2    Please cancel 2.5mg  Rx, we are increasing    Order Specific Question:   Supervising Provider    Answer:   Danise Edge A [[id number]]

## 2023-01-21 NOTE — Assessment & Plan Note (Signed)
  On daily Valtrex for prevention. No reported breakouts. -Continue Valtrex daily.

## 2023-01-21 NOTE — Assessment & Plan Note (Signed)
  No longer taking omeprazole. No reported heartburn symptoms. -Continue off omeprazole.

## 2023-01-21 NOTE — Assessment & Plan Note (Signed)
  Using albuterol 2-3 times per week. Seasonal variation noted. -Continue current management.

## 2023-01-21 NOTE — Assessment & Plan Note (Signed)
  Last A1c check 3 months ago was 5.8. Patient reports occasional itching with Mounjaro. -Repeat A1c today. -Continue Mounjaro.

## 2023-01-21 NOTE — Assessment & Plan Note (Signed)
  Stable on pravastatin. Last cholesterol check in February was within normal limits. -Continue pravastatin.

## 2023-01-21 NOTE — Patient Instructions (Signed)
VISIT SUMMARY:  During your recent visit, we discussed your overall health and current medication regimen. You reported no issues with your medications, which include pravastatin for cholesterol, amlodipine and losartan for blood pressure, and Valtrex for herpes prevention. You also mentioned that you have stopped taking omeprazole due to a lack of heartburn symptoms. You occasionally experience itching from [facility], a medication you take for an unspecified condition. You also mentioned difficulty staying asleep and knee pain, which you manage with ice and Voltaren gel.  YOUR PLAN:  -HIGH CHOLESTEROL: Your cholesterol levels are stable with pravastatin. Please continue taking this medication.  -HEARTBURN: You have not reported any heartburn symptoms and have stopped taking omeprazole. Please continue without this medication.  -HERPES PREVENTION: You are currently taking Valtrex daily for herpes prevention and have not reported any breakouts. Please continue taking this medication daily.  -HIGH BLOOD PRESSURE: Your blood pressure is well controlled with amlodipine and losartan. Please continue taking these medications.  -TYPE 2 DIABETES: Your last A1c check was 5.8, which is within the normal range. You reported occasional itching with Mounjaro. We will repeat the A1c test today and you should continue taking Mounjaro.  -ASTHMA: You are using albuterol 2-3 times per week, with increased usage during certain seasons. Please continue with this management.  -SLEEP DISTURBANCE: You reported difficulty staying asleep. To help with this, I recommend weight loss (due to history of mild sleep apnea), regular exercise, and avoiding caffeine in the evenings.  -GENERAL HEALTH MAINTENANCE: We administered the influenza vaccine during your visit. I recommend getting a COVID booster and tetanus shot at your pharmacy. Please repeat your mammogram in 1 year and continue with regular gynecology follow-ups. We  will send a 90-day supply of Mounjaro to your pharmacy. Your next follow-up with [place] is in 4 months.

## 2023-01-24 ENCOUNTER — Telehealth: Payer: Self-pay | Admitting: Family

## 2023-01-24 NOTE — Telephone Encounter (Signed)
Pt called stating that the [facility] is unable to fill a 3 month supply of the [facility] and pt was looking to have it rerouted to the following pharmacy so she can get that 3 month supply:  [facility] - [place], [place] - [address], [place] [address] Phone: [phone]  Fax: [phone]

## 2023-01-24 NOTE — Telephone Encounter (Signed)
Information retrieved from [facility] and added to immunizations

## 2023-01-25 ENCOUNTER — Other Ambulatory Visit: Payer: Self-pay

## 2023-01-25 DIAGNOSIS — E119 Type 2 diabetes mellitus without complications: Secondary | ICD-10-CM

## 2023-01-25 MED ORDER — TIRZEPATIDE 5 MG/0.5ML ~~LOC~~ SOAJ: 5.0000 mg | SUBCUTANEOUS | 0 refills | Status: AC

## 2023-01-25 NOTE — Telephone Encounter (Signed)
Rx sent 

## 2023-01-28 ENCOUNTER — Encounter: Payer: Self-pay | Admitting: Family

## 2023-02-07 ENCOUNTER — Other Ambulatory Visit: Payer: Self-pay

## 2023-02-08 ENCOUNTER — Other Ambulatory Visit: Payer: Self-pay

## 2023-02-09 ENCOUNTER — Ambulatory Visit: Payer: Self-pay

## 2023-02-09 NOTE — Patient Instructions (Signed)
Visit Information  Thank you for taking time to visit with me today. Please don't hesitate to contact me if I can be of assistance to you.   Following are the goals we discussed today:  Continue to take medications as prescribed. Continue to attend provider visits as scheduled Continue to eat healthy, lean meats, vegetables, fruits, avoid saturated and transfats  Our next appointment is by telephone on 03/16/23 at 3:30 pm  Please call the care guide team at [phone] if you need to cancel or reschedule your appointment.   If you are experiencing a Mental Health or Behavioral Health Crisis or need someone to talk to, please call the Suicide and Crisis Lifeline: 988 call the [place] National Suicide Prevention Lifeline: 862-213-1490 or TTY: 607-817-1483 TTY 858-517-4767) to talk to a trained counselor call 1-800-273-TALK (toll free, 24 hour hotline)  Kathyrn Sheriff, RN, MSN, BSN, CCM Care Management Coordinator [phone]

## 2023-02-09 NOTE — Patient Outreach (Signed)
Care Coordination   Follow Up Visit Note   02/09/2023 Name: Leah Hampton MRN: [id number] DOB: Oct 05, 1955  Leah Hampton is a 67 y.o. year old female who sees Sandford Craze, NP for primary care. I spoke with  [name] by phone today.  What matters to the patients health and wellness today?  Ms. Laurita with h/o bariatric surgery. She reports she is working on losing weight to have knee surgery. She reprots she continues to loose weight. Has started [facility]. Office visit with PCP on 01/21/23. A1C 6.3 on 01/13/23. Per patient, she has been taking medications as prescribed and attending provider appointments as recommended.   Goals Addressed             This Visit's Progress    Education: nutrition       Interventions Today    Flowsheet Row Most Recent Value  Chronic Disease   Chronic disease during today's visit Diabetes, Other, Hypertension (HTN)  [h/o bariatric surgery]  General Interventions   General Interventions Discussed/Reviewed General Interventions Reviewed, Doctor Visits  Doctor Visits Discussed/Reviewed Doctor Visits Reviewed, PCP  PCP/Specialist Visits Compliance with follow-up visit  [reviewed upcoming scheduled appointments]  Exercise Interventions   Exercise Discussed/Reviewed Exercise Reviewed  Education Interventions   Education Provided Provided Education  [mailed: making healthy choices when you eat out and Diabetes and diet.]  Provided Verbal Education On Nutrition  Nutrition Interventions   Nutrition Discussed/Reviewed Nutrition Discussed  Pharmacy Interventions   Pharmacy Dicussed/Reviewed Pharmacy Topics Reviewed            SDOH assessments and interventions completed:  No  Care Coordination Interventions:  Yes, provided   Follow up plan: Follow up call scheduled for 03/16/23    Encounter Outcome:  Patient Visit Completed   Kathyrn Sheriff, RN, MSN, BSN, CCM Care Management Coordinator [phone]

## 2023-02-28 ENCOUNTER — Other Ambulatory Visit: Payer: Self-pay | Admitting: Family

## 2023-03-10 ENCOUNTER — Other Ambulatory Visit (HOSPITAL_BASED_OUTPATIENT_CLINIC_OR_DEPARTMENT_OTHER): Payer: Self-pay

## 2023-03-16 ENCOUNTER — Ambulatory Visit: Payer: Self-pay

## 2023-03-16 NOTE — Patient Instructions (Signed)
Visit Information  Thank you for taking time to visit with me today. Please don't hesitate to contact me if I can be of assistance to you.   Following are the goals we discussed today:  Continue to take medications as prescribed. Continue to attend provider visits as scheduled Continue to eat healthy, lean meats, vegetables, fruits, avoid saturated and transfats Contact Provider with health questions or concerns as needed   If you are experiencing a Mental Health or Behavioral Health Crisis or need someone to talk to, please call the Suicide and Crisis Lifeline: 988 call the [place] National Suicide Prevention Lifeline: (857) 359-4862 or TTY: 916 059 0503 TTY 414-254-3065) to talk to a trained counselor call 1-800-273-TALK (toll free, 24 hour hotline)  Kathyrn Sheriff, RN, MSN, BSN, CCM Care Management Coordinator [phone]

## 2023-03-16 NOTE — Patient Outreach (Signed)
Care Coordination   Follow Up Visit Note   03/16/2023 Name: Leah Hampton MRN: [id number] DOB: [birth year]  Leah Hampton is a 67 y.o. year old female who sees Sandford Craze, NP for primary care. I spoke with  Leah Hampton by phone today.  What matters to the patients health and wellness today?  Leah Hampton reports she is doing good. She states she continues to work out at a fitness center near her home. She denies any questions or concerns at this time. And denies any additional care management needs. Patient to contact RNCM if care management needs in the future.  Goals Addressed             This Visit's Progress    COMPLETED: Education: nutrition       Interventions Today    Flowsheet Row Most Recent Value  Chronic Disease   Chronic disease during today's visit Diabetes, Hypertension (HTN)  General Interventions   General Interventions Discussed/Reviewed General Interventions Reviewed, Doctor Visits  Doctor Visits Discussed/Reviewed Doctor Visits Discussed  [reviewed upcoming/scheduled appointments]  Exercise Interventions   Exercise Discussed/Reviewed Exercise Reviewed, Physical Activity  Physical Activity Discussed/Reviewed Physical Activity Reviewed  Education Interventions   Education Provided Provided Education  [confirmed patient has recieved education articles. provided positive feedback regarding self health management.]  Provided Verbal Education On Medication, When to see the doctor  [advised patient to contact provider with health questions or concerns as needed. take medications as prescribed, attend provider appointment as scheduled/recommended. discussed benefits of consistent exercise.]  Nutrition Interventions   Nutrition Discussed/Reviewed Nutrition Discussed  [discussed portion sizes and monitoring intake during the upcoming holidsay season]  Pharmacy Interventions   Pharmacy Dicussed/Reviewed Pharmacy Topics Discussed, Pharmacy Topics Reviewed             SDOH assessments and interventions completed:  No  Care Coordination Interventions:  Yes, provided   Follow up plan: No further intervention required.   Encounter Outcome:  Patient Visit Completed   Kathyrn Sheriff, RN, MSN, BSN, CCM Care Management Coordinator [phone]

## 2023-03-25 ENCOUNTER — Ambulatory Visit (INDEPENDENT_AMBULATORY_CARE_PROVIDER_SITE_OTHER): Payer: 59 | Admitting: Family

## 2023-03-25 ENCOUNTER — Encounter: Payer: Self-pay | Admitting: Family

## 2023-03-25 VITALS — BP 112/62 | HR 76 | Resp 18 | Ht 63.0 in | Wt 221.4 lb

## 2023-03-25 DIAGNOSIS — M25552 Pain in left hip: Secondary | ICD-10-CM | POA: Diagnosis not present

## 2023-03-25 MED ORDER — METHOCARBAMOL 500 MG PO TABS
500.0000 mg | ORAL_TABLET | Freq: Three times a day (TID) | ORAL | 0 refills | Status: DC | PRN
Start: 2023-03-25 — End: 2023-08-16

## 2023-03-25 MED ORDER — PREDNISONE 20 MG PO TABS: 20.0000 mg | ORAL_TABLET | Freq: Every day | ORAL | 0 refills | Status: DC

## 2023-03-25 NOTE — Progress Notes (Signed)
Leah Hampton is a 67 y.o. female with the following history as recorded in [employer]:  Patient Active Problem List   Diagnosis Date Noted   Sleep disturbance 01/21/2023   Lactose intolerance 10/22/2022   Strep pharyngitis 10/20/2022   S/P gastric sleeve procedure 11/04/2020   Constipation 11/04/2020   Uncomplicated asthma 11/04/2020   Gastroesophageal reflux disease 11/04/2020   Primary osteoarthritis of both hips 09/30/2020   History of total left knee replacement 07/29/2020   Endometrial polyp 07/15/2020   Low grade squamous intraepithelial lesion (LGSIL) on cervical Pap smear 07/07/2020   Upper abdominal pain 08/20/2019   Right hip pain 05/28/2019   Post-menopausal bleeding 05/16/2019   Greater trochanteric pain syndrome of left lower extremity 03/20/2019   Type 2 diabetes mellitus without complication, without long-term current use of insulin (HCC) 05/18/2018   Vitamin D deficiency 02/09/2018   Onychomycosis 09/19/2017   Tinea pedis of both feet 09/19/2017   Obstructive chronic bronchitis with exacerbation (HCC) 05/23/2017   Mixed hyperlipidemia 05/01/2014   Genital herpes 05/23/2012   Stress incontinence 03/04/2012   Osteoarthritis of right knee 10/12/2011   CTS (carpal tunnel syndrome) 07/13/2011   OSA (obstructive sleep apnea) 12/10/2010   Severe obesity (BMI >= 40) (HCC) 07/31/2008   Essential hypertension 07/03/2008    Current Outpatient Medications  Medication Sig Dispense Refill   albuterol (VENTOLIN HFA) 108 (90 Base) MCG/ACT inhaler USE 2 INHALATIONS BY MOUTH EVERY 6 HOURS AS NEEDED FOR WHEEZING  OR SHORTNESS OF BREATH 54 g 3   amLODipine (NORVASC) 5 MG tablet Take 1 tablet (5 mg total) by mouth daily. 90 tablet 1   beclomethasone (QVAR REDIHALER) 80 MCG/ACT inhaler USE 1 INHALATION BY MOUTH INTO  THE LUNGS TWICE DAILY 31.8 g 1   diclofenac Sodium (VOLTAREN) 1 % GEL Apply 2 g topically 4 (four) times daily. 100 g 1   losartan (COZAAR) 100 MG tablet TAKE 1  TABLET BY MOUTH DAILY 100 tablet 1   methocarbamol (ROBAXIN) 500 MG tablet Take 1 tablet (500 mg total) by mouth every 8 (eight) hours as needed for muscle spasms. 30 tablet 0   Multiple Vitamin (MULTIVITAMIN WITH MINERALS) TABS tablet Take 1 tablet by mouth daily. Vitafusion     pravastatin (PRAVACHOL) 80 MG tablet TAKE 1 TABLET BY MOUTH DAILY 100 tablet 2   predniSONE (DELTASONE) 20 MG tablet Take 1 tablet (20 mg total) by mouth daily with breakfast. 5 tablet 0   psyllium (METAMUCIL) 58.6 % powder Take 1 packet by mouth daily as needed (constipation).     tirzepatide [facility]) 5 MG/0.5ML Pen Inject 5 mg into the skin once a week. 6 mL 0   valACYclovir (VALTREX) 1000 MG tablet TAKE 1 TABLET BY MOUTH DAILY 100 tablet 2   tirzepatide (MOUNJARO) 5 MG/0.5ML Pen Inject 5 mg into the skin once a week. 6 mL 2   No current facility-administered medications for this visit.    Allergies: Sulfamethoxazole-trimethoprim, Sulfamethoxazole-trimethoprim [bactrim], and Codeine  Past Medical History:  Diagnosis Date   Bronchitis    Chronic bronchitis (HCC)    Cystitis    Diabetes type 2, controlled (HCC)    Hematuria    urologice eval, Dr Lindley Magnus   History of chest pain    Hyperbilirubinemia    Hyperlipemia    Hypertelorism    Hypertension    Leukocytosis    Obesity    Osteoarthritis    right knee   Sleep apnea 2012   slight    Vaginal Pap  smear, abnormal    Vitamin D deficiency 02/09/2018    Past Surgical History:  Procedure Laterality Date   CERVICAL POLYPECTOMY  07/15/2020   Procedure: POLYPECTOMY with Myosure;  Surgeon: Willodean Rosenthal, MD;  Location[facility];  Service: Gynecology;;   COLONOSCOPY WITH PROPOFOL N/A 03/27/2015   Procedure: COLONOSCOPY WITH PROPOFOL;  Surgeon: Rachael Fee, MD;  Location: [facility];  Service: Endoscopy;  Laterality: N/A;   COLONOSCOPY WITH PROPOFOL N/A 10/02/2020   Procedure: COLONOSCOPY WITH PROPOFOL;  Surgeon: Rachael Fee, MD;  Location: [facility];  Service: Endoscopy;  Laterality: N/A;   COLPOSCOPY N/A 07/15/2020   Procedure: COLPOSCOPY;  Surgeon: Willodean Rosenthal, MD;  Location: [facility] OR;  Service: Gynecology;  Laterality: N/A;   DILATATION & CURETTAGE/HYSTEROSCOPY WITH MYOSURE N/A 01/05/2017   Procedure: DILATATION & CURETTAGE/HYSTEROSCOPY WITH MYOSURE;  Surgeon: Geryl Rankins, MD;  Location: [facility] ORS;  Service: Gynecology;  Laterality: N/A;  PostMenopausal Bleeding   HYSTEROSCOPY WITH D & C N/A 05/22/2019   Procedure: DILATATION AND CURETTAGE /HYSTEROSCOPY, POLYPECTOMY WITH MYOSURE;  Surgeon: Willodean Rosenthal, MD;  Location: [facility] ORS;  Service: Gynecology;  Laterality: N/A;   HYSTEROSCOPY WITH D & C N/A 07/15/2020   Procedure: DILATATION AND CURETTAGE /HYSTEROSCOPY;  Surgeon: Willodean Rosenthal, MD;  Location: [facility] OR;  Service: Gynecology;  Laterality: N/A;   KNEE ARTHROSCOPY  04/2007   left knee   lap sleeve     TOTAL KNEE ARTHROPLASTY Left 10/2008   Dr Irine Seal   UPPER GI ENDOSCOPY N/A 10/14/2020   Procedure: UPPER GI ENDOSCOPY;  Surgeon: Gaynelle Adu, MD;  Location: [facility] ORS;  Service: General;  Laterality: N/A;    Family History  Problem Relation Age of Onset   Heart failure Mother    Arthritis Mother    Hypertension Mother    Stroke Mother    Diabetes Mellitus II Mother    Heart attack Father 86   Diabetes Other    Arthritis Other    Stroke Other    Coronary artery disease Other    Hypertension Sister        x 2   CVA Sister    CAD Sister 68   Diabetes Mellitus II Sister    Cirrhosis Brother    Alcohol abuse Brother    Colon cancer Neg Hx     Social History   Tobacco Use   Smoking status: Never   Smokeless tobacco: Never  Substance Use Topics   Alcohol use: No    Subjective:   Left sided hip pain x 1 week; no known injury or trauma; does need to have right knee replacement surgery- needs to get weight down to 218 pounds; no injury or trauma; does try to walk on treadmill 5 days/ week;  notes that left hip feels better when she is exercising; no numbness/ tingling; limited relief with OTC Ibuprofen;     Objective:  Vitals:   03/25/23 1002  BP: 112/62  Pulse: 76  Resp: 18  SpO2: 95%  Weight: 221 lb 6.4 oz (100.4 kg)  Height: 5\' 3"  (1.6 m)    General: Well developed, well nourished, in no acute distress  Skin : Warm and dry.  Head: Normocephalic and atraumatic  Lungs: Respirations unlabored;  Musculoskeletal: No deformities; no active joint inflammation  Extremities: No edema, cyanosis, clubbing  Vessels: Symmetric bilaterally  Neurologic: Alert and oriented; speech intact; face symmetrical; favored gait; uses cane for stability;  Assessment:  1. Left hip pain     Plan:  Suspect muscular; short term Rx for prednisone and Robaxin; congratulated patient on her hard work with weight loss to get ready for knee replacement surgery; if symptoms persist, she will plan to follow up with her orthopedist.   No follow-ups on file.  No orders of the defined types were placed in this encounter.   Requested Prescriptions   Signed Prescriptions Disp Refills   predniSONE (DELTASONE) 20 MG tablet 5 tablet 0    Sig: Take 1 tablet (20 mg total) by mouth daily with breakfast.   methocarbamol (ROBAXIN) 500 MG tablet 30 tablet 0    Sig: Take 1 tablet (500 mg total) by mouth every 8 (eight) hours as needed for muscle spasms.

## 2023-04-04 ENCOUNTER — Telehealth: Payer: Self-pay | Admitting: Family

## 2023-04-04 NOTE — Telephone Encounter (Signed)
Insurance company [employer] calling regarding a pa FOR MEDICATION. Please call back [phone]

## 2023-04-04 NOTE — Telephone Encounter (Signed)
The name of medication Zilretta, patient states if you can please call her as well.

## 2023-04-04 NOTE — Telephone Encounter (Signed)
Called [employer], representative not able to figure out what the call was about. No information as to which medication this is in regards to either.

## 2023-04-06 NOTE — Telephone Encounter (Signed)
Patient reports the PA she needs is for a knee injection she gets from ortho. Patient advised ortho should be taking care of the authorization. She will contact them.

## 2023-04-07 ENCOUNTER — Other Ambulatory Visit (HOSPITAL_BASED_OUTPATIENT_CLINIC_OR_DEPARTMENT_OTHER): Payer: Self-pay

## 2023-04-08 ENCOUNTER — Telehealth: Payer: Self-pay | Admitting: *Deleted

## 2023-04-08 NOTE — Telephone Encounter (Addendum)
Right knee Zilretta benefits verified via [employer].     ----- Message from Langley Adie sent at 04/04/2023 10:09 AM EST ----- Regarding: Pt cld to request Zilretta injecton Pt's last injection 08/25/22 w/ Dr. Vassie Loll pt of current process & that we would contact her once VOB &PAC requirements reviewed.  Thx

## 2023-04-11 NOTE — Telephone Encounter (Signed)
Pt informed of below.  Medication ordered with [place].  Rec'd sob from [employer]. No PA is req'd.  Patient responsibility for J3304 Arletta Bale), CPT 20610 and specialist office visits will be 20% with the remaining covered at 80%. Patient has a deductible of $240. Deductible has been met.  Patient has an out of pocket maximum of $8850 and has accumulated $587.79. If out of pocket is met, coverage goes to 100%. Patient has Medicaid as secondary insurance. Member's responsibilities left over by Medicare can be billed to Medicaid (If eligible). Member will be covered at 100%.

## 2023-04-13 NOTE — Telephone Encounter (Signed)
I called pt- Zilretta arrived. Office is already closed for today. OV scheduled 04/18/23.

## 2023-04-18 ENCOUNTER — Ambulatory Visit (HOSPITAL_BASED_OUTPATIENT_CLINIC_OR_DEPARTMENT_OTHER)
Admission: RE | Admit: 2023-04-18 | Discharge: 2023-04-18 | Disposition: A | Discharge status: Home / Self Care | Payer: 59 | Source: Ambulatory Visit | Attending: Family Medicine | Admitting: Family Medicine

## 2023-04-18 ENCOUNTER — Ambulatory Visit (HOSPITAL_BASED_OUTPATIENT_CLINIC_OR_DEPARTMENT_OTHER)
Admission: RE | Admit: 2023-04-18 | Discharge: 2023-04-18 | Disposition: A | Discharge status: Home / Self Care | Payer: 59 | Source: Ambulatory Visit | Attending: Family Medicine

## 2023-04-18 ENCOUNTER — Other Ambulatory Visit (HOSPITAL_BASED_OUTPATIENT_CLINIC_OR_DEPARTMENT_OTHER): Payer: Self-pay | Admitting: Family Medicine

## 2023-04-18 ENCOUNTER — Other Ambulatory Visit: Payer: Self-pay | Admitting: Family Medicine

## 2023-04-18 ENCOUNTER — Ambulatory Visit (INDEPENDENT_AMBULATORY_CARE_PROVIDER_SITE_OTHER): Payer: 59 | Admitting: Family Medicine

## 2023-04-18 ENCOUNTER — Encounter: Payer: Self-pay | Admitting: Family Medicine

## 2023-04-18 ENCOUNTER — Other Ambulatory Visit: Payer: Self-pay

## 2023-04-18 VITALS — BP 124/80 | Ht 63.0 in | Wt 221.0 lb

## 2023-04-18 DIAGNOSIS — M25552 Pain in left hip: Secondary | ICD-10-CM | POA: Insufficient documentation

## 2023-04-18 DIAGNOSIS — M1711 Unilateral primary osteoarthritis, right knee: Secondary | ICD-10-CM

## 2023-04-18 DIAGNOSIS — M25461 Effusion, right knee: Secondary | ICD-10-CM | POA: Diagnosis not present

## 2023-04-18 DIAGNOSIS — I878 Other specified disorders of veins: Secondary | ICD-10-CM | POA: Diagnosis not present

## 2023-04-18 DIAGNOSIS — M1612 Unilateral primary osteoarthritis, left hip: Secondary | ICD-10-CM | POA: Diagnosis not present

## 2023-04-18 MED ORDER — TRIAMCINOLONE ACETONIDE 32 MG IX SRER
32.0000 mg | Freq: Once | INTRA_ARTICULAR | Status: AC
Start: 2023-04-18 — End: 2023-04-18
  Administered 2023-04-18: 32 mg via INTRA_ARTICULAR

## 2023-04-18 NOTE — Progress Notes (Signed)
CHIEF COMPLAINT: No chief complaint on file.  _____________________________________________________________ SUBJECTIVE  HPI  Pt is a 67 y.o. female here for evaluation of chronic right knee pain, history of right knee osteoarthritis  Presents today for Zilretta injection, last received 08/25/2022.  Since his injection, the pain recurred 2 months  3 weeks lateral.  Ashby Dawes and location of pain are unchanged.  No numbness or tingling.  Medical history includes type 2 diabetes controlled.  Last hemoglobin A1c 5.8  ------------------------------------------------------------------------------------------------------ Past Medical History:  Diagnosis Date   Bronchitis    Chronic bronchitis (HCC)    Cystitis    Diabetes type 2, controlled (HCC)    Hematuria    urologice eval, Dr Lindley Magnus   History of chest pain    Hyperbilirubinemia    Hyperlipemia    Hypertelorism    Hypertension    Leukocytosis    Obesity    Osteoarthritis    right knee   Sleep apnea 2012   slight    Vaginal Pap smear, abnormal    Vitamin D deficiency 02/09/2018    Past Surgical History:  Procedure Laterality Date   CERVICAL POLYPECTOMY  07/15/2020   Procedure: POLYPECTOMY with Myosure;  Surgeon: Willodean Rosenthal, MD;  Location: [facility] OR;  Service: Gynecology;;   COLONOSCOPY WITH PROPOFOL N/A 03/27/2015   Procedure: COLONOSCOPY WITH PROPOFOL;  Surgeon: Rachael Fee, MD;  Location: [facility];  Service: Endoscopy;  Laterality: N/A;   COLONOSCOPY WITH PROPOFOL N/A 10/02/2020   Procedure: COLONOSCOPY WITH PROPOFOL;  Surgeon: Rachael Fee, MD;  Location: [facility];  Service: Endoscopy;  Laterality: N/A;   COLPOSCOPY N/A 07/15/2020   Procedure: COLPOSCOPY;  Surgeon: Willodean Rosenthal, MD;  Location: [facility] OR;  Service: Gynecology;  Laterality: N/A;   DILATATION & CURETTAGE/HYSTEROSCOPY WITH MYOSURE N/A 01/05/2017   Procedure: DILATATION & CURETTAGE/HYSTEROSCOPY WITH MYOSURE;  Surgeon: Geryl Rankins, MD;  Location: [facility];  Service: Gynecology;  Laterality: N/A;  PostMenopausal Bleeding   HYSTEROSCOPY WITH D & C N/A 05/22/2019   Procedure: DILATATION AND CURETTAGE /HYSTEROSCOPY, POLYPECTOMY WITH MYOSURE;  Surgeon: Willodean Rosenthal, MD;  Location: [facility];  Service: Gynecology;  Laterality: N/A;   HYSTEROSCOPY WITH D & C N/A 07/15/2020   Procedure: DILATATION AND CURETTAGE /HYSTEROSCOPY;  Surgeon: Willodean Rosenthal, MD;  Location: [facility] OR;  Service: Gynecology;  Laterality: N/A;   KNEE ARTHROSCOPY  04/2007   left knee   lap sleeve     TOTAL KNEE ARTHROPLASTY Left 10/2008   Dr Irine Seal   UPPER GI ENDOSCOPY N/A 10/14/2020   Procedure: UPPER GI ENDOSCOPY;  Surgeon: Gaynelle Adu, MD;  Location: [facility] ORS;  Service: General;  Laterality: N/A;      No outpatient medications have been marked as taking for the 04/18/23 encounter (Appointment) with Burna Forts, MD.    ------------------------------------------------------------------------------------------------------  _____________________________________________________________ OBJECTIVE  PHYSICAL EXAM  Today's Vitals   04/18/23 1000  BP: 124/80  Weight: 221 lb (100.2 kg)  Height: 5\' 3"  (1.6 m)   Body mass index is 39.15 kg/m.   reviewed  General: A+Ox3, no acute distress, well-nourished, appropriate affect CV: pulses 2+ regular, nondiaphoretic, no peripheral edema, cap refill <2sec Lungs: no audible wheezing, non-labored breathing, bilateral chest rise/fall, nontachypneic Skin: warm, well-perfused, non-icteric, no susp lesions or rashes Neuro: No focal deficits. Sensation intact, muscle tone wnl, no atrophy Psych: no signs of depression or anxiety MSK:      08/2020 right knee x-ray independently reviewed; there are fairly severe tricompartmental degenerative changes with reduced joint spacing, osteophytic  changes of the distal femur and proximal tibia.  Patellar osteophyte appreciated on lateral view.  Insall  Salvati ratio estimated at 1.28  _____________________________________________________________ ASSESSMENT/PLAN Diagnoses and all orders for this visit:  Primary osteoarthritis of right knee -     Triamcinolone Acetonide (ZILRETTA) intra-articular injection 32 mg -     DG Knee 3 Views Right; Future -     [place] LIMITED JOINT SPACE STRUCTURES LOW RIGHT; Future  Last Zilretta injection lasted less than 3 months.  Last x-ray films were from over 2 years ago.  Discussed decreasing duration of improvement, repeat films ordered today for further assessment.  Proceeded with Zilretta injection  Knee joint injection Site: right Ultrasound-guided intra-articular knee injection After PARQ discussed and verbal consent given, the site was cleaned with alcohol.  Anticipated needle track was anesthetized using 4 mL 1% lidocaine without epinephrine.  Ultrasound placed over suprapatellar aspect of the knee and the anterolateral intra-articular space was identified.  Steroid injection was performed using sterile technique with 4mL 1% lidocaine, 1mL 40mg /mL kenalog and 23G 1.5in needle. This was well tolerated and resulted in little relief. Dressing placed and post injection instructions were given including a discussion of likely return of pain today after the anesthetic wears off (with the possibility of worsened pain) until the steroid starts to work in 1-3 days. Call or return to clinic prn if these symptoms worsen or fail to improve as anticipated.   Acute hip pain, left -     DG HIP UNILAT WITH PELVIS 2-3 VIEWS LEFT; Future  At conclusion of visit, patient requested follow-up to address left pain ongoing for roughly a month, it feels internal as well as lateral, in C shaped distribution.  Films obtained today in anticipation of reviewing x-ray next week and further evaluation.   Electronically signed by: Burna Forts, MD 04/18/2023 7:43 AM

## 2023-04-26 ENCOUNTER — Other Ambulatory Visit (HOSPITAL_BASED_OUTPATIENT_CLINIC_OR_DEPARTMENT_OTHER): Payer: Self-pay

## 2023-04-26 ENCOUNTER — Ambulatory Visit (INDEPENDENT_AMBULATORY_CARE_PROVIDER_SITE_OTHER): Payer: 59 | Admitting: Family Medicine

## 2023-04-26 ENCOUNTER — Encounter: Payer: Self-pay | Admitting: Family Medicine

## 2023-04-26 VITALS — BP 122/70 | Ht 63.0 in | Wt 215.0 lb

## 2023-04-26 DIAGNOSIS — S76012A Strain of muscle, fascia and tendon of left hip, initial encounter: Secondary | ICD-10-CM | POA: Diagnosis not present

## 2023-04-26 DIAGNOSIS — M1711 Unilateral primary osteoarthritis, right knee: Secondary | ICD-10-CM | POA: Diagnosis not present

## 2023-04-26 DIAGNOSIS — M1612 Unilateral primary osteoarthritis, left hip: Secondary | ICD-10-CM | POA: Diagnosis not present

## 2023-04-26 DIAGNOSIS — G8929 Other chronic pain: Secondary | ICD-10-CM

## 2023-04-26 MED ORDER — DICLOFENAC SODIUM 75 MG PO TBEC
75.0000 mg | DELAYED_RELEASE_TABLET | Freq: Two times a day (BID) | ORAL | 0 refills | Status: DC
Start: 2023-04-26 — End: 2023-05-24
  Filled 2023-04-26: qty 60, 30d supply, fill #0

## 2023-04-26 NOTE — Progress Notes (Unsigned)
CHIEF COMPLAINT: No chief complaint on file.  _____________________________________________________________ SUBJECTIVE  HPI  Pt is a 67 y.o. female here for evaluation of left hip  Ongoing for few months, no inciting event, states she woke up up this pain Localized over upper lateral glute region. States did nothing yesterday but pain was a little bad  Exercises in apartment complex. Does have a stepper machine.   Pain is not worse with exercise activities No numbness/tingling Localized, nonradiating, feels like an ache Sometimes keeps her up at night  Previous treatments: heating pad helpful, methocarbamol not helpful,   ------------------------------------------------------------------------------------------------------ Past Medical History:  Diagnosis Date   Bronchitis    Chronic bronchitis (HCC)    Cystitis    Diabetes type 2, controlled (HCC)    Hematuria    urologice eval, Dr Lindley Magnus   History of chest pain    Hyperbilirubinemia    Hyperlipemia    Hypertelorism    Hypertension    Leukocytosis    Obesity    Osteoarthritis    right knee   Sleep apnea 2012   slight    Vaginal Pap smear, abnormal    Vitamin D deficiency 02/09/2018    Past Surgical History:  Procedure Laterality Date   CERVICAL POLYPECTOMY  07/15/2020   Procedure: POLYPECTOMY with Myosure;  Surgeon: Willodean Rosenthal, MD;  Location: [facility] OR;  Service: Gynecology;;   COLONOSCOPY WITH PROPOFOL N/A 03/27/2015   Procedure: COLONOSCOPY WITH PROPOFOL;  Surgeon: Rachael Fee, MD;  Location: [facility];  Service: Endoscopy;  Laterality: N/A;   COLONOSCOPY WITH PROPOFOL N/A 10/02/2020   Procedure: COLONOSCOPY WITH PROPOFOL;  Surgeon: Rachael Fee, MD;  Location: [facility];  Service: Endoscopy;  Laterality: N/A;   COLPOSCOPY N/A 07/15/2020   Procedure: COLPOSCOPY;  Surgeon: Willodean Rosenthal, MD;  Location: [facility] OR;  Service: Gynecology;  Laterality: N/A;   DILATATION &  CURETTAGE/HYSTEROSCOPY WITH MYOSURE N/A 01/05/2017   Procedure: DILATATION & CURETTAGE/HYSTEROSCOPY WITH MYOSURE;  Surgeon: Geryl Rankins, MD;  Location: [facility];  Service: Gynecology;  Laterality: N/A;  PostMenopausal Bleeding   HYSTEROSCOPY WITH D & C N/A 05/22/2019   Procedure: DILATATION AND CURETTAGE /HYSTEROSCOPY, POLYPECTOMY WITH MYOSURE;  Surgeon: Willodean Rosenthal, MD;  Location: [facility];  Service: Gynecology;  Laterality: N/A;   HYSTEROSCOPY WITH D & C N/A 07/15/2020   Procedure: DILATATION AND CURETTAGE /HYSTEROSCOPY;  Surgeon: Willodean Rosenthal, MD;  Location: [facility] OR;  Service: Gynecology;  Laterality: N/A;   KNEE ARTHROSCOPY  04/2007   left knee   lap sleeve     TOTAL KNEE ARTHROPLASTY Left 10/2008   Dr Irine Seal   UPPER GI ENDOSCOPY N/A 10/14/2020   Procedure: UPPER GI ENDOSCOPY;  Surgeon: Gaynelle Adu, MD;  Location: [facility];  Service: General;  Laterality: N/A;      Outpatient Encounter Medications as of 04/26/2023  Medication Sig Note   albuterol (VENTOLIN HFA) 108 (90 Base) MCG/ACT inhaler USE 2 INHALATIONS BY MOUTH EVERY 6 HOURS AS NEEDED FOR WHEEZING  OR SHORTNESS OF BREATH    amLODipine (NORVASC) 5 MG tablet Take 1 tablet (5 mg total) by mouth daily.    beclomethasone (QVAR REDIHALER) 80 MCG/ACT inhaler USE 1 INHALATION BY MOUTH INTO  THE LUNGS TWICE DAILY    diclofenac Sodium (VOLTAREN) 1 % GEL Apply 2 g topically 4 (four) times daily.    losartan (COZAAR) 100 MG tablet TAKE 1 TABLET BY MOUTH DAILY    methocarbamol (ROBAXIN) 500 MG tablet Take 1 tablet (500 mg total) by mouth every 8 (  eight) hours as needed for muscle spasms.    Multiple Vitamin (MULTIVITAMIN WITH MINERALS) TABS tablet Take 1 tablet by mouth daily. Vitafusion    pravastatin (PRAVACHOL) 80 MG tablet TAKE 1 TABLET BY MOUTH DAILY    predniSONE (DELTASONE) 20 MG tablet Take 1 tablet (20 mg total) by mouth daily with breakfast.    psyllium (METAMUCIL) 58.6 % powder Take 1 packet by mouth daily as  needed (constipation). 01/10/2023: Reports uses Metamucil gummies prn   tirzepatide (MOUNJARO) 5 MG/0.5ML Pen Inject 5 mg into the skin once a week.    valACYclovir (VALTREX) 1000 MG tablet TAKE 1 TABLET BY MOUTH DAILY    No facility-administered encounter medications on file as of 04/26/2023.    ------------------------------------------------------------------------------------------------------  _____________________________________________________________ OBJECTIVE  PHYSICAL EXAM  Today's Vitals   04/26/23 0956  BP: 122/70  Weight: 215 lb (97.5 kg)  Height: 5\' 3"  (1.6 m)   Body mass index is 38.09 kg/m.   reviewed  General: A+Ox3, no acute distress, well-nourished, appropriate affect CV: pulses 2+ regular, nondiaphoretic, no peripheral edema, cap refill <2sec Lungs: no audible wheezing, non-labored breathing, bilateral chest rise/fall, nontachypneic Skin: warm, well-perfused, non-icteric, no susp lesions or rashes Neuro: No focal deficits. Sensation intact, muscle tone wnl, no atrophy Psych: no signs of depression or anxiety MSK:    L Hip: No deformity, swelling or wasting ROM Flexion 90, ext 30, IR 20, ER 45 + trendelenberg TTP just inferior to posterolateral iliac crest over musculature, mild TTP GT (not place of normal pain), glute musculature. Lumbar paraspinal hypertonicity NTTP over the hip flexors, adductors, inguinal canal, pubic ramus, si joint, lumbar spine +SIJ rigidity +Ober Negative log roll with FROM Negative FABER Negative FADIR Negative scour Gait baseline antalgic requiring cane  _____________________________________________________________ ASSESSMENT/PLAN Diagnoses and all orders for this visit:  Muscle strain of left gluteal region, initial encounter -     diclofenac (VOLTAREN) 75 MG EC tablet; Take 1 tablet (75 mg total) by mouth 2 (two) times daily. -     Ambulatory referral to Physical Therapy  Osteoarthritis of left hip, unspecified  osteoarthritis type -     diclofenac (VOLTAREN) 75 MG EC tablet; Take 1 tablet (75 mg total) by mouth 2 (two) times daily. -     Ambulatory referral to Physical Therapy  Primary osteoarthritis of right knee   XR hip films reviewed with patient today. Signs of intraarticular hip OA. There is a left lateral iliac crest enthesopathy that may correlate with her area of predominant pain. Location of pain most consistent with glute strain, possible tear on chronic injury. Pain has been bothersome but manageable. Discussed options for management, and Leah Hampton was interested in trialing an alternate NSAID medication. Reviewed dosage, frequency, and counseled that she should discontinue topical voltaren gel and any other NSAIDs while trialing this medication. Continue with heat therapy/otc therapies. PT referral placed to support tight ITB, hamstring tightness, glute strengthening  Leah Hampton also shared that she has had improvement with knee pain but does occasionally have some pain. Reviewed knee XR with patient, which showed progression of tricompartmental OA and concern for more bone-on-bone articulation of the lateral knee joint. She is considering reaching out to her surgeon who had completed her L TKR, is also considering trialing a gel injection. Anticipate f/u in 6-8 weeks, sooner if needed for either concern. All questions answered. Return precautions discussed. Patient verbalized understanding and is in agreement with plan  Electronically signed by: Burna Forts, MD 04/26/2023 7:46 AM

## 2023-04-28 ENCOUNTER — Other Ambulatory Visit: Payer: Self-pay

## 2023-04-28 ENCOUNTER — Other Ambulatory Visit (HOSPITAL_BASED_OUTPATIENT_CLINIC_OR_DEPARTMENT_OTHER): Payer: Self-pay

## 2023-05-10 ENCOUNTER — Other Ambulatory Visit: Payer: Self-pay | Admitting: Family

## 2023-05-13 NOTE — Therapy (Incomplete Revision)
OUTPATIENT PHYSICAL THERAPY LOWER EXTREMITY EVALUATION   Patient Name: Leah Hampton MRN: [id number] DOB:June 19, 1955, 67 y.o., female Today's Date: 05/16/2023  END OF SESSION:  PT End of Session - 05/16/23 1123     Visit Number 1    Number of Visits 12    Date for PT Re-Evaluation 06/27/23    Authorization Type [employer] MCR    PT Start Time 1115    PT Stop Time 1216    PT Time Calculation (min) 61 min    Activity Tolerance Patient tolerated treatment well;Patient limited by pain    Behavior During Therapy WFL for tasks assessed/performed             Past Medical History:  Diagnosis Date   Bronchitis    Chronic bronchitis (HCC)    Cystitis    Diabetes type 2, controlled (HCC)    Hematuria    urologice eval, Dr Lindley Magnus   History of chest pain    Hyperbilirubinemia    Hyperlipemia    Hypertelorism    Hypertension    Leukocytosis    Obesity    Osteoarthritis    right knee   Sleep apnea 2012   slight    Vaginal Pap smear, abnormal    Vitamin D deficiency 02/09/2018   Past Surgical History:  Procedure Laterality Date   CERVICAL POLYPECTOMY  07/15/2020   Procedure: POLYPECTOMY with Myosure;  Surgeon: Willodean Rosenthal, MD;  Location[facility] OR;  Service: Gynecology;;   COLONOSCOPY WITH PROPOFOL N/A 03/27/2015   Procedure: COLONOSCOPY WITH PROPOFOL;  Surgeon: Rachael Fee, MD;  Location: [facility];  Service: Endoscopy;  Laterality: N/A;   COLONOSCOPY WITH PROPOFOL N/A 10/02/2020   Procedure: COLONOSCOPY WITH PROPOFOL;  Surgeon: Rachael Fee, MD;  Location: [facility];  Service: Endoscopy;  Laterality: N/A;   COLPOSCOPY N/A 07/15/2020   Procedure: COLPOSCOPY;  Surgeon: Willodean Rosenthal, MD;  Location: [facility] OR;  Service: Gynecology;  Laterality: N/A;   DILATATION & CURETTAGE/HYSTEROSCOPY WITH MYOSURE N/A 01/05/2017   Procedure: DILATATION & CURETTAGE/HYSTEROSCOPY WITH MYOSURE;  Surgeon: Geryl Rankins, MD;  Location: [facility];  Service: Gynecology;   Laterality: N/A;  PostMenopausal Bleeding   HYSTEROSCOPY WITH D & C N/A 05/22/2019   Procedure: DILATATION AND CURETTAGE /HYSTEROSCOPY, POLYPECTOMY WITH MYOSURE;  Surgeon: Willodean Rosenthal, MD;  Location: [facility];  Service: Gynecology;  Laterality: N/A;   HYSTEROSCOPY WITH D & C N/A 07/15/2020   Procedure: DILATATION AND CURETTAGE /HYSTEROSCOPY;  Surgeon: Willodean Rosenthal, MD;  Location: [facility] OR;  Service: Gynecology;  Laterality: N/A;   KNEE ARTHROSCOPY  04/2007   left knee   lap sleeve     TOTAL KNEE ARTHROPLASTY Left 10/2008   Dr Irine Seal   UPPER GI ENDOSCOPY N/A 10/14/2020   Procedure: UPPER GI ENDOSCOPY;  Surgeon: Gaynelle Adu, MD;  Location: [facility];  Service: General;  Laterality: N/A;   Patient Active Problem List   Diagnosis Date Noted   Sleep disturbance 01/21/2023   Lactose intolerance 10/22/2022   Strep pharyngitis 10/20/2022   S/P gastric sleeve procedure 11/04/2020   Constipation 11/04/2020   Uncomplicated asthma 11/04/2020   Gastroesophageal reflux disease 11/04/2020   Primary osteoarthritis of both hips 09/30/2020   History of total left knee replacement 07/29/2020   Endometrial polyp 07/15/2020   Low grade squamous intraepithelial lesion (LGSIL) on cervical Pap smear 07/07/2020   Upper abdominal pain 08/20/2019   Right hip pain 05/28/2019   Post-menopausal bleeding 05/16/2019   Greater trochanteric pain syndrome of left lower extremity  03/20/2019   Type 2 diabetes mellitus without complication, without long-term current use of insulin (HCC) 05/18/2018   Vitamin D deficiency 02/09/2018   Onychomycosis 09/19/2017   Tinea pedis of both feet 09/19/2017   Obstructive chronic bronchitis with exacerbation (HCC) 05/23/2017   Mixed hyperlipidemia 05/01/2014   Genital herpes 05/23/2012   Stress incontinence 03/04/2012   Osteoarthritis of right knee 10/12/2011   CTS (carpal tunnel syndrome) 07/13/2011   OSA (obstructive sleep apnea) 12/10/2010   Severe obesity  (BMI >= 40) (HCC) 07/31/2008   Essential hypertension 07/03/2008    PCP: Sandford Craze, NP   REFERRING PROVIDER: Burna Forts, MD   REFERRING DIAG:  [phone] (ICD-10-CM) - Osteoarthritis of left hip, unspecified osteoarthritis type  S76.012A (ICD-10-CM) - Muscle strain of left gluteal region, initial encounter    THERAPY DIAG:  Pain in left hip  Difficulty in walking, not elsewhere classified  Pain in right hip  Muscle weakness (generalized)  Rationale for Evaluation and Treatment: Rehabilitation  ONSET DATE: ongoing for a few months  SUBJECTIVE:   SUBJECTIVE STATEMENT: I have had this left hip pain for about 3 months and finally went to the doctor because of the pain. And I was given pills and it helped but I had to stop because of diarrhea.  I am here to learn exercises.  I had a TKA in my left side and now my R knee hurts and I had the shot in my knee 04-18-23. I have a fitness center at my place that I go 5 days. I use treadmill 45 min. I cannot do bike because of knee pain. I do lift  weights sometimes. I have also noticed that my Right hip also hurts where I cannot even lie on my Right hip because of the pain.  I have trouble sleeping because of the pain in my Right hip so I sleep on my left.  PERTINENT HISTORY: OA, Sleep apnea, obesity, HTN, Hyperlipidemia, DMII, chronic bronchitis, TKA Left,   PAIN:  Are you having pain? Yes: NPRS scale: 2/10 sitting.  10/10 when walking or standing too long doing dishes. 20 minutes max Pain location: Left hip strain, Right hip pain  R knee pain Pain description: throbbing hurting  Aggravating factors: standing to wash dishes 15 min, lying on Right hip, going up and down steps.  My left is better with stuff but my right is really hurting Relieving factors: heating  pad and medicine.  PRECAUTIONS: None  RED FLAGS: None   WEIGHT BEARING RESTRICTIONS: No  FALLS:  Has patient fallen in last 6 months? 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 And be able to to go up and down step without pain and . Now I do one step at a time.   NEXT MD VISIT: TBD  OBJECTIVE:  Note: Objective measures were completed at Evaluation unless otherwise noted.  DIAGNOSTIC FINDINGS: FINDINGS: Mild left hip joint space narrowing with acetabular spurring. The femoral head is well seated. No fracture. No erosion, evidence of avascular necrosis or periostitis. No visible focal bone lesion. Mild to moderate degenerative change of the pubic symphysis and both sacroiliac joints. Occasional pelvic phleboliths. Soft tissues are unremarkable.   IMPRESSION: 1. Mild left hip osteoarthritis. 2. Mild to moderate  degenerative change of the pubic symphysis and both sacroiliac joints.     Electronically Signed   By: Narda Rutherford M.D.   On: 04/28/2023 13:53  PATIENT SURVEYS:  FOTO 55% predicted 68 %  COGNITION: Overall cognitive status: Within functional limits for tasks assessed     SENSATION: WFL  EDEMA:  Circumferential: 47 cm, 49.5 cm  R/L  MUSCLE LENGTH: Hamstrings: Right 56 deg; Left 62 deg Thomas test: Bil tightness  + ober test  POSTURE: rounded shoulders, forward head, flexed trunk , and weight shift left  PALPATION: Left  hip TTP under lateral iliac crest.  R hip marked TTP over Right gluteal just under iliac crest and piriformis.  Right knee with slight swelling and OA as per MD report- possible TKA Right in future.  LOWER EXTREMITY ROM:  Active ROM Right eval Left eval  Hip flexion 105 supine 100 supine  Hip extension    Hip abduction    Hip adduction    Hip internal rotation 20 20  Hip external rotation 45 45  Knee flexion 70 hard end feel  A 115/P120  Knee extension -15 -3  Ankle dorsiflexion    Ankle plantarflexion    Ankle inversion    Ankle eversion     (Blank rows = not tested)  LOWER EXTREMITY MMT:  MMT Right eval Left eval  Hip flexion 4 4  Hip extension 4 4-  Hip abduction 4- 4-  Hip adduction    Hip internal rotation    Hip external rotation    Knee flexion 4 4+  Knee extension 4 4+  Ankle dorsiflexion    Ankle plantarflexion    Ankle inversion    Ankle eversion     (Blank rows = not tested)  LOWER EXTREMITY SPECIAL TESTS:   + Ober - FABER -FADIR - Scour test FUNCTIONAL TESTS:  5 times sit to stand: 9.44 sec  6 minute walk test: walk test  276ft  ( Norm for age 55 ft)          SL balance on left  5 sec,  Right 0 sec due to pain  Squats with 60 degree hip flexion and forward trunk GAIT: Distance walked: 245 2 MWT Assistive device utilized:  no AD Level of assistance: Complete Independence Comments: + Trendelenberg                                                                                                                                TREATMENT DATE: 05-16-23 Eval and issue HEP and TPDN RX Trigger Point Dry-Needling  STW- Manual Right gluteal and piriformis myofascial release Treatment instructions: Expect mild to moderate muscle soreness. S/S of pneumothorax if dry needled over a lung field, and to seek immediate medical attention should they occur. Patient verbalized understanding of these instructions and education.  Patient Consent Given: Yes Education handout provided: Yes Muscles treated: Right gluteals, piriformis Electrical stimulation performed: No Parameters:  N/A Treatment response/outcome: Twitch response and relieved muscle tension     PATIENT EDUCATION:  Education details: POC ,FOTO report, issue HEP, TPDN education  Person educated: Patient Education method: Explanation, Demonstration, Tactile cues, Verbal cues, and Handouts Education comprehension: verbalized  understanding, returned demonstration, verbal cues required, tactile cues required, and needs further education  HOME EXERCISE PROGRAM: Access Code: [email or web address] URL: [email or web address] Date: 05/16/2023 Prepared by: Garen Lah  Exercises - Seated Isometric Hip Adduction with Newman Pies  - 1 x daily - 7 x weekly - 3 sets - 10 reps - Sit to stand with sink support Movement snack  - 1 x daily - 7 x weekly - 3 sets - 10 reps - Seated Hip Abduction with Resistance  - 1 x daily - 7 x weekly - 3 sets - 10 reps - 3 hold - Seated Hamstring Stretch  - 1-2 x daily - 7 x weekly - 1 sets - 3 reps - 30 sec hold - Standing Hip Abduction with Counter Support  - 1 x daily - 7 x weekly - 3 sets - 10 reps  ASSESSMENT:  CLINICAL IMPRESSION: Patient is a 67 y.o. female who was seen today for physical therapy evaluation and treatment for Left   gluteal muscle strain , Right hip pain/gluteal/piriformis pain and Right knee OA pain.  Pt with + trendelenberg and has difficulty walking. Pt lives alone and needs to be fully functioning.  Pt has lost weight and has had a L TKA and is preparing and trying to exercise in order to prepare for future surgery. Pt has a gym in apartment complex and reports that she tries to go at least 3-5 x a week.  Pt has a fear of falling although does not report any falls and will benefit from skilled PT to address impairment and be educated on floor to standing transfers and to have ability to rise if necessary since she lives alone.  Pt will benefit from skilled PT to address weaknesses in bil hips and LE's and fall preparedness.   OBJECTIVE IMPAIRMENTS: decreased activity tolerance, decreased endurance, decreased mobility, difficulty walking, decreased ROM, decreased strength, increased fascial restrictions, obesity, and pain.   ACTIVITY LIMITATIONS: standing, squatting, sleeping, stairs, and locomotion level  PARTICIPATION LIMITATIONS: meal prep, cleaning, laundry,  shopping, and community activity  PERSONAL FACTORS: OA, Sleep apnea, obesity, HTN, Hyperlipidemia, DMII, chronic bronchitis, TKA Left,  are also affecting patient's functional outcome.   REHAB POTENTIAL: Good  CLINICAL DECISION MAKING: Evolving/moderate complexity  EVALUATION COMPLEXITY: Moderate   GOALS: Goals reviewed with patient? Yes  SHORT TERM GOALS: Target date: 06-06-23  Pt be independent with initial HEP  Baseline:no knowledge Goal status: INITIAL  2.  Report being able to sleep 50% better due to pain, comfort Baseline: unable to sleep on Right side and sleep affected Goal status: INITIAL  3.  Pt will perform 6 MWT  for at least 800 feet Baseline: 5 times sit to stand: 9.44 sec  6 minute walk test: walk test  257ft  ( Norm for age 61 ft) Goal status: INITIAL  4.  Pain in Right and left hip will be reduced at least 25 % from eval Baseline: 10/10 after walking longer than  15  minutes Goal status: INITIAL    LONG TERM GOALS: Target date: 06-27-23  Pt will be independent with advanced HEP Baseline: no knowledge Goal status: INITIAL  2.  Pt will tolerate standing and walking for 1 hours without increased pain in hips in  order to perform household chores Baseline: can only walk for 15 min before 10/10 pain Goal status: INITIAL  3.  FOTO will improve from  55%  to  68%   indicating improved functional mobility.  Baseline: EVAL 55% Goal status: INITIAL  4.  Pt will improve Bil LE strength to >/= 4+/5 with </= 2/10 pain to promote safety with walking/standing activities Baseline:  Goal status: INITIAL  5.  Pt will demonstrates ability to rise from floor to standing to insure safety as pt lives alone Baseline: Pt fearful of falling but has thus far not fallen Goal status: INITIAL  6.  Pt will be educated on proper execution of exercises on gym equipment available in her apartment gym with safety. Baseline: limited knowledge Goal status:  INITIAL   PLAN:  PT FREQUENCY: 1-2x/week  PT DURATION: 6 weeks  PLANNED INTERVENTIONS: 97164- PT Re-evaluation, 97110-Therapeutic exercises, 97530- Therapeutic activity, [id number]- Neuromuscular re-education, 97535- Self Care, 16109- Manual therapy, [id number]- Gait training, 97014- Electrical stimulation (unattended), [phone]- Electrical stimulation (manual), Patient/Family education, Balance training, Stair training, Taping, Dry Needling, Joint mobilization, Spinal mobilization, Cryotherapy, and Moist heat  PLAN FOR NEXT SESSION: Check on TPDN done on  Right hip.  Bil hip strengthening. Try IT band stretch and ober stretching  Garen Lah, PT, [facility] Certified Exercise Expert for the Aging Adult  05/16/23 12:49 PM Phone: [phone] Fax: [phone]   Garen Lah, PT, [facility] Certified Exercise Expert for the Aging Adult  05/16/23 12:59 PM Phone: [phone] Fax: [phone]   Date of referral: 04-26-23 Referring provider: Burna Forts, MD  Referring diagnosis?  M16.12 (ICD-10-CM) - Osteoarthritis of left hip, unspecified osteoarthritis type  S76.012A (ICD-10-CM) - Muscle strain of left gluteal region, initial encounter   Treatment diagnosis? (if different than referring diagnosis) Right hip pain/piriformis, Right knee pain (OA)  What was this (referring dx) caused by? Arthritis and Other: musculoskeletal pain  Nature of Condition: Initial Onset (within last 3 months) muscle strain, for chronic OA   Laterality: Both  Current Functional Measure Score: FOTO 55%  Objective measurements identify impairments when they are compared to normal values, the uninvolved extremity, and prior level of function.  [x]  Yes  []  No  Objective assessment of functional ability: Minimal functional limitations   Briefly describe symptoms: Left hip muscle strain.  Right hip pain, R knee pain  How did symptoms start: no incident over time  Average pain intensity:  Last 24 hours:  3/10  Past week: 9/10  How often does the pt experience symptoms? Constantly  How much have the symptoms interfered with usual daily activities? Moderately  How has condition changed since care began at this facility? NA - initial visit  In general, how is the patients overall health? Very Good   BACK PAIN (STarT Back Screening Tool) No

## 2023-05-13 NOTE — Therapy (Unsigned)
OUTPATIENT PHYSICAL THERAPY LOWER EXTREMITY EVALUATION   Patient Name: Leah Hampton MRN: [id number] DOB:05/27/1955, 67 y.o., female Today's Date: 05/16/2023  END OF SESSION:  PT End of Session - 05/16/23 1123     Visit Number 1    Number of Visits 12    Date for PT Re-Evaluation 06/27/23    Authorization Type [employer] MCR    PT Start Time 1115    PT Stop Time 1216    PT Time Calculation (min) 61 min    Activity Tolerance Patient tolerated treatment well;Patient limited by pain    Behavior During Therapy WFL for tasks assessed/performed             Past Medical History:  Diagnosis Date   Bronchitis    Chronic bronchitis (HCC)    Cystitis    Diabetes type 2, controlled (HCC)    Hematuria    urologice eval, Dr Lindley Magnus   History of chest pain    Hyperbilirubinemia    Hyperlipemia    Hypertelorism    Hypertension    Leukocytosis    Obesity    Osteoarthritis    right knee   Sleep apnea 2012   slight    Vaginal Pap smear, abnormal    Vitamin D deficiency 02/09/2018   Past Surgical History:  Procedure Laterality Date   CERVICAL POLYPECTOMY  07/15/2020   Procedure: POLYPECTOMY with Myosure;  Surgeon: Willodean Rosenthal, MD;  Location: [facility] OR;  Service: Gynecology;;   COLONOSCOPY WITH PROPOFOL N/A 03/27/2015   Procedure: COLONOSCOPY WITH PROPOFOL;  Surgeon: Rachael Fee, MD;  Location: [facility];  Service: Endoscopy;  Laterality: N/A;   COLONOSCOPY WITH PROPOFOL N/A 10/02/2020   Procedure: COLONOSCOPY WITH PROPOFOL;  Surgeon: Rachael Fee, MD;  Location: [facility];  Service: Endoscopy;  Laterality: N/A;   COLPOSCOPY N/A 07/15/2020   Procedure: COLPOSCOPY;  Surgeon: Willodean Rosenthal, MD;  Location: [facility] OR;  Service: Gynecology;  Laterality: N/A;   DILATATION & CURETTAGE/HYSTEROSCOPY WITH MYOSURE N/A 01/05/2017   Procedure: DILATATION & CURETTAGE/HYSTEROSCOPY WITH MYOSURE;  Surgeon: Geryl Rankins, MD;  Location: [facility];  Service: Gynecology;   Laterality: N/A;  PostMenopausal Bleeding   HYSTEROSCOPY WITH D & C N/A 05/22/2019   Procedure: DILATATION AND CURETTAGE /HYSTEROSCOPY, POLYPECTOMY WITH MYOSURE;  Surgeon: Willodean Rosenthal, MD;  Location: [facility];  Service: Gynecology;  Laterality: N/A;   HYSTEROSCOPY WITH D & C N/A 07/15/2020   Procedure: DILATATION AND CURETTAGE /HYSTEROSCOPY;  Surgeon: Willodean Rosenthal, MD;  Location: [facility] OR;  Service: Gynecology;  Laterality: N/A;   KNEE ARTHROSCOPY  04/2007   left knee   lap sleeve     TOTAL KNEE ARTHROPLASTY Left 10/2008   Dr Irine Seal   UPPER GI ENDOSCOPY N/A 10/14/2020   Procedure: UPPER GI ENDOSCOPY;  Surgeon: Gaynelle Adu, MD;  Location: [facility];  Service: General;  Laterality: N/A;   Patient Active Problem List   Diagnosis Date Noted   Sleep disturbance 01/21/2023   Lactose intolerance 10/22/2022   Strep pharyngitis 10/20/2022   S/P gastric sleeve procedure 11/04/2020   Constipation 11/04/2020   Uncomplicated asthma 11/04/2020   Gastroesophageal reflux disease 11/04/2020   Primary osteoarthritis of both hips 09/30/2020   History of total left knee replacement 07/29/2020   Endometrial polyp 07/15/2020   Low grade squamous intraepithelial lesion (LGSIL) on cervical Pap smear 07/07/2020   Upper abdominal pain 08/20/2019   Right hip pain 05/28/2019   Post-menopausal bleeding 05/16/2019   Greater trochanteric pain syndrome of left lower extremity  03/20/2019   Type 2 diabetes mellitus without complication, without long-term current use of insulin (HCC) 05/18/2018   Vitamin D deficiency 02/09/2018   Onychomycosis 09/19/2017   Tinea pedis of both feet 09/19/2017   Obstructive chronic bronchitis with exacerbation (HCC) 05/23/2017   Mixed hyperlipidemia 05/01/2014   Genital herpes 05/23/2012   Stress incontinence 03/04/2012   Osteoarthritis of right knee 10/12/2011   CTS (carpal tunnel syndrome) 07/13/2011   OSA (obstructive sleep apnea) 12/10/2010   Severe obesity  (BMI >= 40) (HCC) 07/31/2008   Essential hypertension 07/03/2008    PCP: Sandford Craze, NP   REFERRING PROVIDER: Burna Forts, MD   REFERRING DIAG:  [phone] (ICD-10-CM) - Osteoarthritis of left hip, unspecified osteoarthritis type  S76.012A (ICD-10-CM) - Muscle strain of left gluteal region, initial encounter    THERAPY DIAG:  Pain in left hip  Difficulty in walking, not elsewhere classified  Pain in right hip  Muscle weakness (generalized)  Rationale for Evaluation and Treatment: Rehabilitation  ONSET DATE: ongoing for a few months  SUBJECTIVE:   SUBJECTIVE STATEMENT: I have had this left hip pain for about 3 months and finally went to the doctor because of the pain. And I was given pills and it helped but I had to stop because of diarrhea.  I am here to learn exercises.  I had a TKA in my left side and now my R knee hurts and I had the shot in my knee 04-18-23. I have a fitness center at my place that I go 5 days. I use treadmill 45 min. I cannot do bike because of knee pain. I do lift  weights sometimes. I have also noticed that my Right hip also hurts where I cannot even lie on my Right hip because of the pain.  I have trouble sleeping because of the pain in my Right hip so I sleep on my left.  PERTINENT HISTORY: OA, Sleep apnea, obesity, HTN, Hyperlipidemia, DMII, chronic bronchitis, TKA Left,   PAIN:  Are you having pain? Yes: NPRS scale: 2/10 sitting.  10/10 when walking or standing too long doing dishes. 20 minutes max Pain location: Left hip strain, Right hip pain  R knee pain Pain description: throbbing hurting  Aggravating factors: standing to wash dishes 15 min, lying on Right hip, going up and down steps.  My left is better with stuff but my right is really hurting Relieving factors: heating  pad and medicine.  PRECAUTIONS: None  RED FLAGS: None   WEIGHT BEARING RESTRICTIONS: No  FALLS:  Has patient fallen in last 6 months? 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 And be able to to go up and down step without pain and . Now I do one step at a time.   NEXT MD VISIT: TBD  OBJECTIVE:  Note: Objective measures were completed at Evaluation unless otherwise noted.  DIAGNOSTIC FINDINGS: FINDINGS: Mild left hip joint space narrowing with acetabular spurring. The femoral head is well seated. No fracture. No erosion, evidence of avascular necrosis or periostitis. No visible focal bone lesion. Mild to moderate degenerative change of the pubic symphysis and both sacroiliac joints. Occasional pelvic phleboliths. Soft tissues are unremarkable.   IMPRESSION: 1. Mild left hip osteoarthritis. 2. Mild to moderate  degenerative change of the pubic symphysis and both sacroiliac joints.     Electronically Signed   By: Narda Rutherford M.D.   On: 04/28/2023 13:53  PATIENT SURVEYS:  FOTO 55% predicted 68 %  COGNITION: Overall cognitive status: Within functional limits for tasks assessed     SENSATION: WFL  EDEMA:  Circumferential: 47 cm, 49.5 cm  R/L  MUSCLE LENGTH: Hamstrings: Right 56 deg; Left 62 deg Thomas test: Bil tightness  + ober test  POSTURE: rounded shoulders, forward head, flexed trunk , and weight shift left  PALPATION: Left  hip TTP under lateral iliac crest.  R hip marked TTP over Right gluteal just under iliac crest and piriformis.  Right knee with slight swelling and OA as per MD report- possible TKA Right in future.  LOWER EXTREMITY ROM:  Active ROM Right eval Left eval  Hip flexion 105 supine 100 supine  Hip extension    Hip abduction    Hip adduction    Hip internal rotation 20 20  Hip external rotation 45 45  Knee flexion 70 hard end feel  A 115/P120  Knee extension -15 -3  Ankle dorsiflexion    Ankle plantarflexion    Ankle inversion    Ankle eversion     (Blank rows = not tested)  LOWER EXTREMITY MMT:  MMT Right eval Left eval  Hip flexion 4 4  Hip extension 4 4-  Hip abduction 4- 4-  Hip adduction    Hip internal rotation    Hip external rotation    Knee flexion 4 4+  Knee extension 4 4+  Ankle dorsiflexion    Ankle plantarflexion    Ankle inversion    Ankle eversion     (Blank rows = not tested)  LOWER EXTREMITY SPECIAL TESTS:   + Ober - FABER -FADIR - Scour test FUNCTIONAL TESTS:  5 times sit to stand: 9.44 sec  6 minute walk test: walk test  268ft  ( Norm for age 32 ft)          SL balance on left  5 sec,  Right 0 sec due to pain  Squats with 60 degree hip flexion and forward trunk GAIT: Distance walked: 245 2 MWT Assistive device utilized:  no AD Level of assistance: Complete Independence Comments: + Trendelenberg                                                                                                                                TREATMENT DATE: 05-16-23 Eval and issue HEP and TPDN RX Trigger Point Dry-Needling  Treatment instructions: Expect mild to moderate muscle soreness. S/S of pneumothorax if dry needled over a lung field, and to seek immediate medical attention should they occur. Patient verbalized understanding of these instructions and education.  Patient Consent Given: Yes Education handout provided: Yes Muscles treated: Right gluteals, piriformis Electrical stimulation performed: No Parameters: N/A Treatment response/outcome: Twitch response and relieved muscle  tension     PATIENT EDUCATION:  Education details: POC ,FOTO report, issue HEP, TPDN education  Person educated: Patient Education method: Explanation, Demonstration, Tactile cues, Verbal cues, and Handouts Education comprehension: verbalized understanding, returned demonstration, verbal cues  required, tactile cues required, and needs further education  HOME EXERCISE PROGRAM: Access Code: [email or web address] URL: [email or web address] Date: 05/16/2023 Prepared by: Garen Lah  Exercises - Seated Isometric Hip Adduction with Newman Pies  - 1 x daily - 7 x weekly - 3 sets - 10 reps - Sit to stand with sink support Movement snack  - 1 x daily - 7 x weekly - 3 sets - 10 reps - Seated Hip Abduction with Resistance  - 1 x daily - 7 x weekly - 3 sets - 10 reps - 3 hold - Seated Hamstring Stretch  - 1-2 x daily - 7 x weekly - 1 sets - 3 reps - 30 sec hold - Standing Hip Abduction with Counter Support  - 1 x daily - 7 x weekly - 3 sets - 10 reps  ASSESSMENT:  CLINICAL IMPRESSION: Patient is a 67 y.o. female who was seen today for physical therapy evaluation and treatment for Left   gluteal muscle strain , Right hip pain/gluteal/piriformis pain and Right knee OA pain.  Pt with + trendelenberg and has difficulty walking. Pt lives alone and needs to be fully functioning.  Pt has lost weight and has had a L TKA and is preparing and trying to exercise in order to prepare for future surgery. Pt has a gym in apartment complex and reports that she tries to go at least 3-5 x a week.  Pt has a fear of falling although does not report any falls and will benefit from skilled PT to address impairment and be educated on floor to standing transfers and to have ability to rise if necessary since she lives alone.  Pt will benefit from skilled PT to address weaknesses in bil hips and LE's and fall preparedness.   OBJECTIVE IMPAIRMENTS: decreased activity tolerance, decreased endurance, decreased mobility, difficulty walking, decreased ROM, decreased strength, increased fascial restrictions, obesity, and pain.   ACTIVITY LIMITATIONS: standing, squatting, sleeping, stairs, and locomotion level  PARTICIPATION LIMITATIONS: meal prep, cleaning, laundry, shopping, and community activity  PERSONAL FACTORS: OA,  Sleep apnea, obesity, HTN, Hyperlipidemia, DMII, chronic bronchitis, TKA Left,  are also affecting patient's functional outcome.   REHAB POTENTIAL: Good  CLINICAL DECISION MAKING: Evolving/moderate complexity  EVALUATION COMPLEXITY: Moderate   GOALS: Goals reviewed with patient? Yes  SHORT TERM GOALS: Target date: 06-06-23  Pt be independent with initial HEP  Baseline:no knowledge Goal status: INITIAL  2.  Report being able to sleep 50% better due to pain, comfort Baseline: unable to sleep on Right side and sleep affected Goal status: INITIAL  3.  Pt will perform 6 MWT  for at least 800 feet Baseline: 5 times sit to stand: 9.44 sec  6 minute walk test: walk test  249ft  ( Norm for age 50 ft) Goal status: INITIAL  4.  Pain in Right and left hip will be reduced at least 25 % from eval Baseline: 10/10 after walking longer than  15  minutes Goal status: INITIAL    LONG TERM GOALS: Target date: 06-27-23  Pt will be independent with advanced HEP Baseline: no knowledge Goal status: INITIAL  2.  Pt will tolerate standing and walking for 1 hours without increased pain in hips in order to perform household chores Baseline: can only  walk for 15 min before 10/10 pain Goal status: INITIAL  3.  FOTO will improve from  55%  to  68%   indicating improved functional mobility.  Baseline: EVAL 55% Goal status: INITIAL  4.  Pt will improve Bil LE strength to >/= 4+/5 with </= 2/10 pain to promote safety with walking/standing activities Baseline:  Goal status: INITIAL  5.  Pt will demonstrates ability to rise from floor to standing to insure safety as pt lives alone Baseline: Pt fearful of falling but has thus far not fallen Goal status: INITIAL  6.  Pt will be educated on proper execution of exercises on gym equipment available in her apartment gym with safety. Baseline: limited knowledge Goal status: INITIAL   PLAN:  PT FREQUENCY: 1-2x/week  PT DURATION: 6  weeks  PLANNED INTERVENTIONS: 97164- PT Re-evaluation, 97110-Therapeutic exercises, 97530- Therapeutic activity, [id number]- Neuromuscular re-education, 97535- Self Care, 96295- Manual therapy, [id number]- Gait training, 97014- Electrical stimulation (unattended), [phone]- Electrical stimulation (manual), Patient/Family education, Balance training, Stair training, Taping, Dry Needling, Joint mobilization, Spinal mobilization, Cryotherapy, and Moist heat  PLAN FOR NEXT SESSION: Check on TPDN done on  Right hip.  Bil hip strengthening. Try IT band stretch and ober stretching  Garen Lah, PT, [facility] Certified Exercise Expert for the Aging Adult  05/16/23 12:49 PM Phone: [phone] Fax: [phone]    Date of referral: 04-26-23 Referring provider: Burna Forts, MD  Referring diagnosis?  M16.12 (ICD-10-CM) - Osteoarthritis of left hip, unspecified osteoarthritis type  S76.012A (ICD-10-CM) - Muscle strain of left gluteal region, initial encounter   Treatment diagnosis? (if different than referring diagnosis) Right hip pain/piriformis, Right knee pain (OA)  What was this (referring dx) caused by? Arthritis and Other: musculoskeletal pain  Nature of Condition: Initial Onset (within last 3 months) muscle strain, for chronic OA   Laterality: Both  Current Functional Measure Score: FOTO 55%  Objective measurements identify impairments when they are compared to normal values, the uninvolved extremity, and prior level of function.  [x]  Yes  []  No  Objective assessment of functional ability: Minimal functional limitations   Briefly describe symptoms: Left hip muscle strain.  Right hip pain, R knee pain  How did symptoms start: no incident over time  Average pain intensity:  Last 24 hours: 3/10  Past week: 9/10  How often does the pt experience symptoms? Constantly  How much have the symptoms interfered with usual daily activities? Moderately  How has condition changed since care began at  this facility? NA - initial visit  In general, how is the patients overall health? Very Good   BACK PAIN (STarT Back Screening Tool) No

## 2023-05-14 ENCOUNTER — Other Ambulatory Visit: Payer: Self-pay | Admitting: Family

## 2023-05-16 ENCOUNTER — Ambulatory Visit: Payer: 59 | Attending: Family Medicine | Admitting: Physical Therapy

## 2023-05-16 ENCOUNTER — Other Ambulatory Visit: Payer: Self-pay

## 2023-05-16 DIAGNOSIS — M1612 Unilateral primary osteoarthritis, left hip: Secondary | ICD-10-CM | POA: Insufficient documentation

## 2023-05-16 DIAGNOSIS — M6281 Muscle weakness (generalized): Secondary | ICD-10-CM | POA: Insufficient documentation

## 2023-05-16 DIAGNOSIS — S76012A Strain of muscle, fascia and tendon of left hip, initial encounter: Secondary | ICD-10-CM | POA: Diagnosis not present

## 2023-05-16 DIAGNOSIS — M25551 Pain in right hip: Secondary | ICD-10-CM | POA: Insufficient documentation

## 2023-05-16 DIAGNOSIS — M25552 Pain in left hip: Secondary | ICD-10-CM | POA: Diagnosis not present

## 2023-05-16 DIAGNOSIS — R262 Difficulty in walking, not elsewhere classified: Secondary | ICD-10-CM | POA: Diagnosis not present

## 2023-05-16 NOTE — Patient Instructions (Signed)

## 2023-05-23 ENCOUNTER — Ambulatory Visit: Payer: 59 | Attending: Family Medicine | Admitting: Physical Therapy

## 2023-05-23 ENCOUNTER — Encounter: Payer: Self-pay | Admitting: Physical Therapy

## 2023-05-23 DIAGNOSIS — M25552 Pain in left hip: Secondary | ICD-10-CM | POA: Insufficient documentation

## 2023-05-23 DIAGNOSIS — M25551 Pain in right hip: Secondary | ICD-10-CM | POA: Diagnosis not present

## 2023-05-23 DIAGNOSIS — M6281 Muscle weakness (generalized): Secondary | ICD-10-CM | POA: Diagnosis not present

## 2023-05-23 DIAGNOSIS — R262 Difficulty in walking, not elsewhere classified: Secondary | ICD-10-CM | POA: Diagnosis not present

## 2023-05-23 NOTE — Therapy (Signed)
 OUTPATIENT PHYSICAL THERAPY TREATMENT   Patient Name: Leah Hampton MRN: [id number] DOB:01/08/1956, 68 y.o., female Today's Date: 05/23/2023  END OF SESSION:  PT End of Session - 05/23/23 1152     Visit Number 2    Number of Visits 12    Date for PT Re-Evaluation 06/27/23    Authorization Type [employer] MCR    PT Start Time 1101    PT Stop Time 1148    PT Time Calculation (min) 47 min    Activity Tolerance Patient tolerated treatment well    Behavior During Therapy WFL for tasks assessed/performed              Past Medical History:  Diagnosis Date   Bronchitis    Chronic bronchitis (HCC)    Cystitis    Diabetes type 2, controlled (HCC)    Hematuria    urologice eval, Dr Alfonzo   History of chest pain    Hyperbilirubinemia    Hyperlipemia    Hypertelorism    Hypertension    Leukocytosis    Obesity    Osteoarthritis    right knee   Sleep apnea 2012   slight    Vaginal Pap smear, abnormal    Vitamin D  deficiency 02/09/2018   Past Surgical History:  Procedure Laterality Date   CERVICAL POLYPECTOMY  07/15/2020   Procedure: POLYPECTOMY with Myosure;  Surgeon: Corene Coy, MD;  Location: [facility];  Service: Gynecology;;   COLONOSCOPY WITH PROPOFOL  N/A 03/27/2015   Procedure: COLONOSCOPY WITH PROPOFOL ;  Surgeon: Toribio SHAUNNA Cedar, MD;  Location: [facility];  Service: Endoscopy;  Laterality: N/A;   COLONOSCOPY WITH PROPOFOL  N/A 10/02/2020   Procedure: COLONOSCOPY WITH PROPOFOL ;  Surgeon: Cedar Toribio SHAUNNA, MD;  Location: [facility];  Service: Endoscopy;  Laterality: N/A;   COLPOSCOPY N/A 07/15/2020   Procedure: COLPOSCOPY;  Surgeon: Corene Coy, MD;  Location: [facility] OR;  Service: Gynecology;  Laterality: N/A;   DILATATION & CURETTAGE/HYSTEROSCOPY WITH MYOSURE N/A 01/05/2017   Procedure: DILATATION & CURETTAGE/HYSTEROSCOPY WITH MYOSURE;  Surgeon: Timmie Norris, MD;  Location: [facility];  Service: Gynecology;  Laterality: N/A;  PostMenopausal Bleeding    HYSTEROSCOPY WITH D & C N/A 05/22/2019   Procedure: DILATATION AND CURETTAGE /HYSTEROSCOPY, POLYPECTOMY WITH MYOSURE;  Surgeon: Corene Coy, MD;  Location: [facility];  Service: Gynecology;  Laterality: N/A;   HYSTEROSCOPY WITH D & C N/A 07/15/2020   Procedure: DILATATION AND CURETTAGE /HYSTEROSCOPY;  Surgeon: Corene Coy, MD;  Location: [facility] OR;  Service: Gynecology;  Laterality: N/A;   KNEE ARTHROSCOPY  04/2007   left knee   lap sleeve     TOTAL KNEE ARTHROPLASTY Left 10/2008   Dr Sherryn   UPPER GI ENDOSCOPY N/A 10/14/2020   Procedure: UPPER GI ENDOSCOPY;  Surgeon: Blush Locus, MD;  Location: [facility];  Service: General;  Laterality: N/A;   Patient Active Problem List   Diagnosis Date Noted   Sleep disturbance 01/21/2023   Lactose intolerance 10/22/2022   Strep pharyngitis 10/20/2022   S/P gastric sleeve procedure 11/04/2020   Constipation 11/04/2020   Uncomplicated asthma 11/04/2020   Gastroesophageal reflux disease 11/04/2020   Primary osteoarthritis of both hips 09/30/2020   History of total left knee replacement 07/29/2020   Endometrial polyp 07/15/2020   Low grade squamous intraepithelial lesion (LGSIL) on cervical Pap smear 07/07/2020   Upper abdominal pain 08/20/2019   Right hip pain 05/28/2019   Post-menopausal bleeding 05/16/2019   Greater trochanteric pain syndrome of left lower extremity 03/20/2019   Type  2 diabetes mellitus without complication, without long-term current use of insulin  (HCC) 05/18/2018   Vitamin D  deficiency 02/09/2018   Onychomycosis 09/19/2017   Tinea pedis of both feet 09/19/2017   Obstructive chronic bronchitis with exacerbation (HCC) 05/23/2017   Mixed hyperlipidemia 05/01/2014   Genital herpes 05/23/2012   Stress incontinence 03/04/2012   Osteoarthritis of right knee 10/12/2011   CTS (carpal tunnel syndrome) 07/13/2011   OSA (obstructive sleep apnea) 12/10/2010   Severe obesity (BMI >= 40) (HCC) 07/31/2008   Essential  hypertension 07/03/2008    PCP: Daryl Setter, NP   REFERRING PROVIDER: Marcene Benedict ORN, MD   REFERRING DIAG:  [phone] (ICD-10-CM) - Osteoarthritis of left hip, unspecified osteoarthritis type  S76.012A (ICD-10-CM) - Muscle strain of left gluteal region, initial encounter    THERAPY DIAG:  Pain in left hip  Difficulty in walking, not elsewhere classified  Pain in right hip  Muscle weakness (generalized)  Rationale for Evaluation and Treatment: Rehabilitation  ONSET DATE: ongoing for a few months  SUBJECTIVE:   SUBJECTIVE STATEMENT: I feel like I am doing okay, I haven't been able to do the exercises that were given last time.  PERTINENT HISTORY: OA, Sleep apnea, obesity, HTN, Hyperlipidemia, DMII, chronic bronchitis, TKA Left,   PAIN:  Are you having pain? Yes: NPRS scale: 2/10 Pain location: Left hip strain, Right hip pain  R knee pain Pain description: throbbing hurting  Aggravating factors: standing to wash dishes 15 min, lying on Right hip, going up and down steps.  My left is better with stuff but my right is really hurting Relieving factors: heating  pad and medicine.  PRECAUTIONS: None  RED FLAGS: None   WEIGHT BEARING RESTRICTIONS: No  FALLS:  Has patient fallen in last 6 months? 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 And be able to to go up and down step without pain and . Now I do one step at a time.   NEXT MD VISIT: TBD  OBJECTIVE:  Note: Objective measures were completed at Evaluation unless otherwise noted.  DIAGNOSTIC FINDINGS: FINDINGS: Mild left hip joint space narrowing with acetabular spurring. The femoral head is well seated. No fracture. No erosion, evidence of avascular  necrosis or periostitis. No visible focal bone lesion. Mild to moderate degenerative change of the pubic symphysis and both sacroiliac joints. Occasional pelvic phleboliths. Soft tissues are unremarkable.   IMPRESSION: 1. Mild left hip osteoarthritis. 2. Mild to moderate degenerative change of the pubic symphysis and both sacroiliac joints.     Electronically Signed   By: Andrea Gasman M.D.   On: 04/28/2023 13:53  PATIENT SURVEYS:  FOTO 55% predicted 68 %  COGNITION: Overall cognitive status: Within functional limits for tasks assessed     SENSATION: WFL  EDEMA:  Circumferential: 47 cm, 49.5 cm  R/L  MUSCLE LENGTH: Hamstrings: Right 56 deg; Left 62 deg Thomas test: Bil tightness  + ober test  POSTURE: rounded shoulders, forward head, flexed trunk , and weight shift left  PALPATION: Left  hip TTP under lateral iliac crest.  R hip marked TTP over Right gluteal just under iliac crest and piriformis.  Right knee with slight swelling and OA as per MD report- possible TKA Right in future.  LOWER EXTREMITY ROM:  Active ROM Right eval Left eval  Hip flexion 105 supine 100 supine  Hip extension    Hip abduction    Hip adduction    Hip internal rotation 20 20  Hip external rotation 45 45  Knee flexion 70 hard end feel A 115/P120  Knee extension -15 -3  Ankle dorsiflexion    Ankle plantarflexion    Ankle inversion    Ankle eversion     (Blank rows = not tested)  LOWER EXTREMITY MMT:  MMT Right eval Left eval  Hip flexion 4 4  Hip extension 4 4-  Hip abduction 4- 4-  Hip adduction    Hip internal rotation    Hip external rotation    Knee flexion 4 4+  Knee extension 4 4+  Ankle dorsiflexion    Ankle plantarflexion    Ankle inversion    Ankle eversion     (Blank rows = not tested)  LOWER EXTREMITY SPECIAL TESTS:   + Ober - FABER -FADIR - Scour test FUNCTIONAL TESTS:  5 times sit to stand: 9.44 sec  6 minute walk test: walk test  245ft  (  Norm for age 50 ft)          SL balance on left  5 sec,  Right 0 sec due to pain  Squats with 60 degree hip flexion and forward trunk GAIT: Distance walked: 245 2 MWT Assistive device utilized:  no AD Level of assistance: Complete Independence Comments: + Trendelenberg                                                                                                                                TREATMENT  [facility] Adult PT Treatment:                                                DATE: 05/23/2023 Therapeutic Exercise: Seated adductor isometric 1 x 10 holding 5 sec.  Seated hip abduction 2 x 10 hodling 1 sec with GTB Sit to stand from high-low 2 x 10 - lowering table between sets - verbal cues to keep feet parallel vs pulling the LLE back and the RLE forward Standing hip abduction  2 x 10 bil with HHA on back of chair for stability  Reviewed HEP and discussed frequency and benefits of consistency Manual Therapy: MTPR along the R glute med, and used tennis ball to help relieve tension for treatment at home.  Neuromuscular re-ed: Gait training using trekking poles 3 x 60 ft demonstration for proper form.   DATE: 05-16-23 Eval and issue HEP and TPDN RX Trigger Point Dry-Needling  Treatment instructions: Expect mild to moderate muscle soreness. S/S of pneumothorax if dry needled over a lung field, and to seek immediate medical attention should they occur. Patient verbalized understanding of these instructions  and education.  Patient Consent Given: Yes Education handout provided: Yes Muscles treated: Right gluteals, piriformis Electrical stimulation performed: No Parameters: N/A Treatment response/outcome: Twitch response and relieved muscle tension     PATIENT EDUCATION:  Education details: POC ,FOTO report, issue HEP, TPDN education  Person educated: Patient Education method: Explanation, Demonstration, Tactile cues, Verbal cues, and Handouts Education comprehension: verbalized  understanding, returned demonstration, verbal cues required, tactile cues required, and needs further education  HOME EXERCISE PROGRAM: Access Code: [email or web address] URL: [email or web address] Date: 05/16/2023 Prepared by: Graydon Dingwall  Exercises - Seated Isometric Hip Adduction with Mercer  - 1 x daily - 7 x weekly - 3 sets - 10 reps - Sit to stand with sink support Movement snack  - 1 x daily - 7 x weekly - 3 sets - 10 reps - Seated Hip Abduction with Resistance  - 1 x daily - 7 x weekly - 3 sets - 10 reps - 3 hold - Seated Hamstring Stretch  - 1-2 x daily - 7 x weekly - 1 sets - 3 reps - 30 sec hold - Standing Hip Abduction with Counter Support  - 1 x daily - 7 x weekly - 3 sets - 10 reps  ASSESSMENT:  CLINICAL IMPRESSION: 1/6/2025Annette presents to physical therapy for her 2nd visit reporting reduction in pain, but hasn't started her HEP. Time was taken to review her HEP and discuss frequency/ benefits of consistency. Worked on gait training using trekking poles to facilitate trunk rotation/ arm swing which greatly reduced her antalgic gait pattern. Continued working on gross hip strengthen which she did well with but does fatigue quickly with standing hip abduction. End of session she noted feeling better.   Evaluation: Patient is a 68 y.o. female who was seen today for physical therapy evaluation and treatment for Left   gluteal muscle strain , Right hip pain/gluteal/piriformis pain and Right knee OA pain.  Pt with + trendelenberg and has difficulty walking. Pt lives alone and needs to be fully functioning.  Pt has lost weight and has had a L TKA and is preparing and trying to exercise in order to prepare for future surgery. Pt has a gym in apartment complex and reports that she tries to go at least 3-5 x a week.  Pt has a fear of falling although does not report any falls and will benefit from skilled PT to address impairment and be educated on floor to standing transfers and to  have ability to rise if necessary since she lives alone.  Pt will benefit from skilled PT to address weaknesses in bil hips and LE's and fall preparedness.   OBJECTIVE IMPAIRMENTS: decreased activity tolerance, decreased endurance, decreased mobility, difficulty walking, decreased ROM, decreased strength, increased fascial restrictions, obesity, and pain.   ACTIVITY LIMITATIONS: standing, squatting, sleeping, stairs, and locomotion level  PARTICIPATION LIMITATIONS: meal prep, cleaning, laundry, shopping, and community activity  PERSONAL FACTORS: OA, Sleep apnea, obesity, HTN, Hyperlipidemia, DMII, chronic bronchitis, TKA Left,  are also affecting patient's functional outcome.   REHAB POTENTIAL: Good  CLINICAL DECISION MAKING: Evolving/moderate complexity  EVALUATION COMPLEXITY: Moderate   GOALS: Goals reviewed with patient? Yes  SHORT TERM GOALS: Target date: 06-06-23  Pt be independent with initial HEP  Baseline:no knowledge Goal status: INITIAL  2.  Report being able to sleep 50% better due to pain, comfort Baseline: unable to sleep on Right side and sleep affected Goal status: INITIAL  3.  Pt will perform 6 MWT  for at least 800  feet Baseline: 5 times sit to stand: 9.44 sec  6 minute walk test: walk test  227ft  ( Norm for age 4 ft) Goal status: INITIAL  4.  Pain in Right and left hip will be reduced at least 25 % from eval Baseline: 10/10 after walking longer than  15  minutes Goal status: INITIAL    LONG TERM GOALS: Target date: 06-27-23  Pt will be independent with advanced HEP Baseline: no knowledge Goal status: INITIAL  2.  Pt will tolerate standing and walking for 1 hours without increased pain in hips in order to perform household chores Baseline: can only walk for 15 min before 10/10 pain Goal status: INITIAL  3.  FOTO will improve from  55%  to  68%   indicating improved functional mobility.  Baseline: EVAL 55% Goal status: INITIAL  4.  Pt will  improve Bil LE strength to >/= 4+/5 with </= 2/10 pain to promote safety with walking/standing activities Baseline:  Goal status: INITIAL  5.  Pt will demonstrates ability to rise from floor to standing to insure safety as pt lives alone Baseline: Pt fearful of falling but has thus far not fallen Goal status: INITIAL  6.  Pt will be educated on proper execution of exercises on gym equipment available in her apartment gym with safety. Baseline: limited knowledge Goal status: INITIAL   PLAN:  PT FREQUENCY: 1-2x/week  PT DURATION: 6 weeks  PLANNED INTERVENTIONS: 97164- PT Re-evaluation, 97110-Therapeutic exercises, 97530- Therapeutic activity, [id number]- Neuromuscular re-education, 97535- Self Care, [id number]- Manual therapy, [id number]- Gait training, 97014- Electrical stimulation (unattended), [phone]- Electrical stimulation (manual), Patient/Family education, Balance training, Stair training, Taping, Dry Needling, Joint mobilization, Spinal mobilization, Cryotherapy, and Moist heat  PLAN FOR NEXT SESSION: Check on TPDN done on  Right hip.  Bil hip strengthening. Try IT band stretch and ober stretching   Sundy Houchins PT, DPT, LAT, ATC  05/23/23  11:57 AM

## 2023-05-24 ENCOUNTER — Ambulatory Visit (INDEPENDENT_AMBULATORY_CARE_PROVIDER_SITE_OTHER): Payer: 59 | Admitting: Family

## 2023-05-24 ENCOUNTER — Other Ambulatory Visit (HOSPITAL_BASED_OUTPATIENT_CLINIC_OR_DEPARTMENT_OTHER): Payer: Self-pay

## 2023-05-24 VITALS — BP 111/55 | HR 100 | Temp 97.6°F | Resp 16 | Ht 63.0 in | Wt 219.0 lb

## 2023-05-24 DIAGNOSIS — I10 Essential (primary) hypertension: Secondary | ICD-10-CM | POA: Diagnosis not present

## 2023-05-24 DIAGNOSIS — A6000 Herpesviral infection of urogenital system, unspecified: Secondary | ICD-10-CM

## 2023-05-24 DIAGNOSIS — E119 Type 2 diabetes mellitus without complications: Secondary | ICD-10-CM | POA: Diagnosis not present

## 2023-05-24 DIAGNOSIS — R87612 Low grade squamous intraepithelial lesion on cytologic smear of cervix (LGSIL): Secondary | ICD-10-CM | POA: Diagnosis not present

## 2023-05-24 DIAGNOSIS — E782 Mixed hyperlipidemia: Secondary | ICD-10-CM

## 2023-05-24 DIAGNOSIS — K219 Gastro-esophageal reflux disease without esophagitis: Secondary | ICD-10-CM

## 2023-05-24 LAB — LIPID PANEL
Cholesterol: 157 mg/dL (ref 0–200)
HDL: 55.7 mg/dL (ref >39.00)
LDL Cholesterol: 90 mg/dL (ref 0–99)
NonHDL: 101.65
Total CHOL/HDL Ratio: 3
Triglycerides: 56 mg/dL (ref 0.0–149.0)
VLDL: 11.2 mg/dL (ref 0.0–40.0)

## 2023-05-24 LAB — BASIC METABOLIC PANEL
BUN: 17 mg/dL (ref 6–23)
CO2: 31 meq/L (ref 19–32)
Calcium: 9.2 mg/dL (ref 8.4–10.5)
Chloride: 106 meq/L (ref 96–112)
Creatinine, Ser: 0.7 mg/dL (ref 0.40–1.20)
GFR: 90.62 mL/min (ref >60.00)
Glucose, Bld: 79 mg/dL (ref 70–99)
Potassium: 3.7 meq/L (ref 3.5–5.1)
Sodium: 142 meq/L (ref 135–145)

## 2023-05-24 LAB — HEMOGLOBIN A1C: Hgb A1c MFr Bld: 5.9 % (ref 4.6–6.5)

## 2023-05-24 MED ORDER — TIRZEPATIDE 7.5 MG/0.5ML ~~LOC~~ SOAJ
7.5000 mg | SUBCUTANEOUS | 0 refills | Status: DC
Start: 2023-05-24 — End: 2023-08-23
  Filled 2023-05-24: qty 6, 84d supply, fill #0

## 2023-05-24 NOTE — Assessment & Plan Note (Signed)
 No recent breakouts on prophylactic valtrex.

## 2023-05-24 NOTE — Progress Notes (Signed)
 Subjective:     Patient ID: Leah Hampton, female    DOB: 11/14/1955, 68 y.o.   MRN: [id number]  Chief Complaint  Patient presents with   Diabetes    Here for follow up   Hypertension    Here for follow up    HPI  Discussed the use of AI scribe software for clinical note transcription with the patient, who gave verbal consent to proceed.  History of Present Illness   The patient, with a history of hypertension, hyperlipidemia, herpes simplex virus, gastroesophageal reflux disease, asthma, and obesity, presents for a routine follow-up. She reports good adherence to her current medication regimen, including amlodipine  5mg , losartan  100mg , pravastatin , Valtrex , and Mounjaro  5mg . She denies any recent breakouts related to herpes simplex virus and has not been experiencing heartburn symptoms. Her asthma is well-controlled, and she has not needed any refills for her inhaler. She reports some itching at the site of Mounjaro  injection and occasional constipation. She also reports difficulty sleeping, often waking up between 2 and 4 am, but she does not feel groggy during the day. She has tried melatonin and Benadryl  without significant improvement. She has lost 25 pounds since starting Mounjaro  a year ago. She also reports some improvement in her mobility, no longer needing a cane for ambulation.     Wt Readings from Last 3 Encounters:  05/24/23 219 lb (99.3 kg)  04/26/23 215 lb (97.5 kg)  04/18/23 221 lb (100.2 kg)         Health Maintenance Due  Topic Date Due   DTaP/Tdap/Td (2 - Tdap) 07/03/2018   COVID-19 Vaccine (7 - 2024-25 season) 01/16/2023   Diabetic kidney evaluation - Urine ACR  04/07/2023    Past Medical History:  Diagnosis Date   Bronchitis    Chronic bronchitis (HCC)    Cystitis    Diabetes type 2, controlled (HCC)    Hematuria    urologice eval, Dr Alfonzo   History of chest pain    Hyperbilirubinemia    Hyperlipemia    Hypertelorism    Hypertension     Leukocytosis    Obesity    Osteoarthritis    right knee   Sleep apnea 2012   slight    Vaginal Pap smear, abnormal    Vitamin D  deficiency 02/09/2018    Past Surgical History:  Procedure Laterality Date   CERVICAL POLYPECTOMY  07/15/2020   Procedure: POLYPECTOMY with Myosure;  Surgeon: Corene Coy, MD;  [facility];  Service: Gynecology;;   COLONOSCOPY WITH PROPOFOL  N/A 03/27/2015   Procedure: COLONOSCOPY WITH PROPOFOL ;  Surgeon: Toribio SHAUNNA Cedar, MD;  Location: [facility]COPY;  Service: Endoscopy;  Laterality: N/A;   COLONOSCOPY WITH PROPOFOL  N/A 10/02/2020   Procedure: COLONOSCOPY WITH PROPOFOL ;  Surgeon: Cedar Toribio SHAUNNA, MD;  Location: [facility];  Service: Endoscopy;  Laterality: N/A;   COLPOSCOPY N/A 07/15/2020   Procedure: COLPOSCOPY;  Surgeon: Corene Coy, MD;  Location: [facility] OR;  Service: Gynecology;  Laterality: N/A;   DILATATION & CURETTAGE/HYSTEROSCOPY WITH MYOSURE N/A 01/05/2017   Procedure: DILATATION & CURETTAGE/HYSTEROSCOPY WITH MYOSURE;  Surgeon: Timmie Norris, MD;  Location: [facility] ORS;  Service: Gynecology;  Laterality: N/A;  PostMenopausal Bleeding   HYSTEROSCOPY WITH D & C N/A 05/22/2019   Procedure: DILATATION AND CURETTAGE /HYSTEROSCOPY, POLYPECTOMY WITH MYOSURE;  Surgeon: Corene Coy, MD;  Location: [facility] OR[facility];  Service: Gynecology;  Laterality: N/A;   HYSTEROSCOPY WITH D & C N/A 07/15/2020   Procedure: DILATATION AND CURETTAGE /HYSTEROSCOPY;  Surgeon: Corene,  Elveria, MD;  Location: [facility] OR;  Service: Gynecology;  Laterality: N/A;   KNEE ARTHROSCOPY  04/2007   left knee   lap sleeve     TOTAL KNEE ARTHROPLASTY Left 10/2008   Dr Sherryn   UPPER GI ENDOSCOPY N/A 10/14/2020   Procedure: UPPER GI ENDOSCOPY;  Surgeon: Tanda Locus, MD;  Location: [facility] ORS;  Service: General;  Laterality: N/A;    Family History  Problem Relation Age of Onset   Heart failure Mother    Arthritis Mother    Hypertension Mother    Stroke Mother     Diabetes Mellitus II Mother    Heart attack Father 98   Diabetes Other    Arthritis Other    Stroke Other    Coronary artery disease Other    Hypertension Sister        x 2   CVA Sister    CAD Sister 65   Diabetes Mellitus II Sister    Cirrhosis Brother    Alcohol abuse Brother    Colon cancer Neg Hx     Social History   Socioeconomic History   Marital status: Single    Spouse name: Not on file   Number of children: 2   Years of education: Not on file   Highest education level: Not on file  Occupational History   Occupation: disabled  Tobacco Use   Smoking status: Never   Smokeless tobacco: Never  Vaping Use   Vaping status: Never Used  Substance and Sexual Activity   Alcohol use: No   Drug use: No   Sexual activity: Not Currently  Other Topics Concern   Not on file  Social History Narrative   Regular exercise:  No   Lives alone   Has 2 grown sons, both local.  1 grandson   On disability due to her knee pain   Completed 12th grade   Enjoys television   No pets.    Social Drivers of Corporate Investment Banker Strain: Low Risk  (05/19/2023)   Overall Financial Resource Strain (CARDIA)    Difficulty of Paying Living Expenses: Not hard at all  Food Insecurity: No Food Insecurity (05/19/2023)   Hunger Vital Sign    Worried About Running Out of Food in the Last Year: Never true    Ran Out of Food in the Last Year: Never true  Transportation Needs: No Transportation Needs (05/19/2023)   PRAPARE - Administrator, Civil Service (Medical): No    Lack of Transportation (Non-Medical): No  Physical Activity: Sufficiently Active (05/19/2023)   Exercise Vital Sign    Days of Exercise per Week: 5 days    Minutes of Exercise per Session: 50 min  Stress: Stress Concern Present (05/19/2023)   [employer] of Occupational Health - Occupational Stress Questionnaire    Feeling of Stress : To some extent  Social Connections: Moderately Integrated (05/19/2023)    Social Connection and Isolation Panel [NHANES]    Frequency of Communication with Friends and Family: More than three times a week    Frequency of Social Gatherings with Friends and Family: More than three times a week    Attends Religious Services: More than 4 times per year    Active Member of [employer] or Organizations: Yes    Attends Banker Meetings: Never    Marital Status: Never married  Intimate Partner Violence: Not At Risk (11/25/2022)   Humiliation, Afraid, Rape, and Kick questionnaire    Fear  of Current or Ex-Partner: No    Emotionally Abused: No    Physically Abused: No    Sexually Abused: No    Outpatient Medications Prior to Visit  Medication Sig Dispense Refill   albuterol  (VENTOLIN  HFA) 108 (90 Base) MCG/ACT inhaler USE 2 INHALATIONS BY MOUTH EVERY 6 HOURS AS NEEDED FOR WHEEZING  OR SHORTNESS OF BREATH 54 g 3   amLODipine  (NORVASC ) 5 MG tablet TAKE 1 TABLET BY MOUTH DAILY 100 tablet 2   beclomethasone (QVAR  REDIHALER) 80 MCG/ACT inhaler USE 1 INHALATION BY MOUTH INTO  THE LUNGS TWICE DAILY 31.8 g 1   losartan  (COZAAR ) 100 MG tablet TAKE 1 TABLET BY MOUTH DAILY 100 tablet 1   methocarbamol  (ROBAXIN ) 500 MG tablet Take 1 tablet (500 mg total) by mouth every 8 (eight) hours as needed for muscle spasms. 30 tablet 0   Multiple Vitamin (MULTIVITAMIN WITH MINERALS) TABS tablet Take 1 tablet by mouth daily. Vitafusion     pravastatin  (PRAVACHOL ) 80 MG tablet TAKE 1 TABLET BY MOUTH DAILY 100 tablet 2   psyllium (METAMUCIL) 58.6 % powder Take 1 packet by mouth daily as needed (constipation).     valACYclovir  (VALTREX ) 1000 MG tablet TAKE 1 TABLET BY MOUTH DAILY 100 tablet 2   diclofenac  (VOLTAREN ) 75 MG EC tablet Take 1 tablet (75 mg total) by mouth 2 (two) times daily. 60 tablet 0   tirzepatide  (MOUNJARO ) 5 MG/0.5ML Pen Inject 5 mg into the skin once a week. 6 mL 2   omeprazole  (PRILOSEC) 20 MG capsule TAKE 1 CAPSULE BY MOUTH DAILY 100 capsule 2   predniSONE  (DELTASONE )  20 MG tablet Take 1 tablet (20 mg total) by mouth daily with breakfast. 5 tablet 0   No facility-administered medications prior to visit.    Allergies  Allergen Reactions   Sulfamethoxazole -Trimethoprim Itching   Sulfamethoxazole -Trimethoprim [Bactrim] Itching   Diclofenac      diarrhea   Codeine Nausea And Vomiting and Anxiety    ROS    See HPI Objective:    Physical Exam Constitutional:      Appearance: She is well-developed.  Cardiovascular:     Rate and Rhythm: Normal rate and regular rhythm.     Heart sounds: Normal heart sounds. No murmur heard. Pulmonary:     Effort: Pulmonary effort is normal. No respiratory distress.     Breath sounds: Normal breath sounds. No wheezing.  Psychiatric:        Behavior: Behavior normal.        Thought Content: Thought content normal.        Judgment: Judgment normal.      BP (!) 111/55 (BP Location: Right Arm, Patient Position: Sitting, Cuff Size: Large)   Pulse 100   Temp 97.6 F (36.4 C) (Oral)   Resp 16   Ht 5' 3 (1.6 m)   Wt 219 lb (99.3 kg)   LMP 11/10/2011   SpO2 100%   BMI 38.79 kg/m  Wt Readings from Last 3 Encounters:  05/24/23 219 lb (99.3 kg)  04/26/23 215 lb (97.5 kg)  04/18/23 221 lb (100.2 kg)       Assessment & Plan:   Problem List Items Addressed This Visit       Unprioritized   Type 2 diabetes mellitus without complication, without long-term current use of insulin  [facility])   Lab Results  Component Value Date   HGBA1C 5.8 01/21/2023   HGBA1C 5.8 10/20/2022   HGBA1C 6.0 07/06/2022   Lab Results  Component Value Date  MICROALBUR <0.7 04/06/2022   LDLCALC 102 (H) 07/06/2022   CREATININE 0.78 01/21/2023   Stable on mounjaro  from a glycemic standpoint but she is starting to gain weight again- will try increasing to 7.5 to assist with weight management.  Wt Readings from Last 3 Encounters:  05/24/23 219 lb (99.3 kg)  04/26/23 215 lb (97.5 kg)  04/18/23 221 lb (100.2 kg)         Relevant  Medications   tirzepatide  (MOUNJARO ) 7.5 MG/0.5ML Pen   Other Relevant Orders   HgB A1c   Urine Microalbumin w/creat. ratio   Basic Metabolic Panel (BMET)   Mixed hyperlipidemia   Lab Results  Component Value Date   CHOL 175 07/06/2022   HDL 60.30 07/06/2022   LDLCALC 102 (H) 07/06/2022   TRIG 63.0 07/06/2022   CHOLHDL 3 07/06/2022  Tolerating pravastatin , update lipid panel.        Relevant Orders   Lipid panel   Low grade squamous intraepithelial lesion (LGSIL) on cervical Pap smear   Negative colpo 5/24.  Following with GYN.       Genital herpes   No recent breakouts on prophylactic valtrex .       Gastroesophageal reflux disease   Stable without prilosec.       Essential hypertension - Primary   BP at goal, continue losartan  and amlodipine .        I have discontinued Lakisha M. Bacci's tirzepatide , predniSONE , diclofenac , and omeprazole . I am also having her start on tirzepatide . Additionally, I am having her maintain her multivitamin with minerals, psyllium, albuterol , Qvar  RediHaler, losartan , methocarbamol , amLODipine , valACYclovir , and pravastatin .  Meds ordered this encounter  Medications   tirzepatide  (MOUNJARO ) 7.5 MG/0.5ML Pen    Sig: Inject 7.5 mg into the skin once a week.    Dispense:  6 mL    Refill:  0    Supervising Provider:   DOMENICA BLACKBIRD A [[id number]]

## 2023-05-24 NOTE — Assessment & Plan Note (Signed)
 BP at goal, continue losartan and amlodipine.

## 2023-05-24 NOTE — Patient Instructions (Signed)
 VISIT SUMMARY:  You came in today for a routine follow-up. You have been doing well with your current medications and have experienced some positive changes, including weight loss and improved mobility. We discussed several of your ongoing health issues and made some adjustments to your treatment plan.  YOUR PLAN:  -HYPERTENSION: Hypertension means high blood pressure. Your blood pressure is well controlled with your current medications, Amlodipine  5mg  and Losartan  100mg  daily. Please continue with this regimen.  -HYPERLIPIDEMIA: Hyperlipidemia means high cholesterol levels. You are currently taking Pravastatin , but we need to check your cholesterol levels. A lipid panel has been ordered for today.  -ABNORMAL PAP SMEAR: An abnormal Pap smear can indicate changes in the cells of the cervix. Your last Pap smear in April 2024 was abnormal, but a follow-up colposcopy in June 2024 showed no signs of cancer. We will repeat the Pap smear in one year as recommended.  -HERPES SIMPLEX VIRUS: Herpes Simplex Virus is a viral infection that causes sores. You are taking Valtrex  daily and have not had any recent breakouts. Please continue with this medication as prescribed.  -GASTROESOPHAGEAL REFLUX DISEASE: Gastroesophageal Reflux Disease (GERD) is a condition where stomach acid frequently flows back into the esophagus. You are not currently experiencing symptoms and are not on Prilosec. No changes are needed at this time.  -ASTHMA: Asthma is a condition where your airways narrow and swell, making it difficult to breathe. Your asthma is well controlled, and you do not need any medication refills. Please continue with your current regimen.  -TYPE 2 DIABETES: Type 2 Diabetes is a condition that affects the way your body processes blood sugar. You are taking Mounjaro  5mg  daily and have experienced some mild itching at the injection site and occasional constipation. You have also lost 25 pounds since starting this  medication. We will increase your dose to 7.5mg  daily and monitor for any worsening symptoms.  -INSOMNIA: Insomnia is difficulty falling or staying asleep. You report waking up between 2-4am most nights but do not feel groggy during the day. You have tried melatonin and Benadryl  without much success. We encourage you to continue practicing good sleep hygiene.  INSTRUCTIONS:  Please have your cholesterol levels checked today. We will plan for a follow-up visit in 3 months.

## 2023-05-24 NOTE — Assessment & Plan Note (Signed)
 Lab Results  Component Value Date   CHOL 175 07/06/2022   HDL 60.30 07/06/2022   LDLCALC 102 (H) 07/06/2022   TRIG 63.0 07/06/2022   CHOLHDL 3 07/06/2022  Tolerating pravastatin, update lipid panel.

## 2023-05-24 NOTE — Assessment & Plan Note (Signed)
 Negative colpo 5/24.  Following with GYN.

## 2023-05-24 NOTE — Assessment & Plan Note (Addendum)
 Lab Results  Component Value Date   HGBA1C 5.8 01/21/2023   HGBA1C 5.8 10/20/2022   HGBA1C 6.0 07/06/2022   Lab Results  Component Value Date   MICROALBUR <0.7 04/06/2022   LDLCALC 102 (H) 07/06/2022   CREATININE 0.78 01/21/2023   Stable on mounjaro  from a glycemic standpoint but she is starting to gain weight again- will try increasing to 7.5 to assist with weight management.  Wt Readings from Last 3 Encounters:  05/24/23 219 lb (99.3 kg)  04/26/23 215 lb (97.5 kg)  04/18/23 221 lb (100.2 kg)

## 2023-05-24 NOTE — Assessment & Plan Note (Signed)
 Stable without prilosec.

## 2023-05-25 ENCOUNTER — Ambulatory Visit: Payer: 59 | Admitting: Physical Therapy

## 2023-05-25 ENCOUNTER — Encounter: Payer: Self-pay | Admitting: Physical Therapy

## 2023-05-25 DIAGNOSIS — M25551 Pain in right hip: Secondary | ICD-10-CM

## 2023-05-25 DIAGNOSIS — R262 Difficulty in walking, not elsewhere classified: Secondary | ICD-10-CM

## 2023-05-25 DIAGNOSIS — M25552 Pain in left hip: Secondary | ICD-10-CM

## 2023-05-25 DIAGNOSIS — M6281 Muscle weakness (generalized): Secondary | ICD-10-CM

## 2023-05-25 LAB — MICROALBUMIN / CREATININE URINE RATIO
Creatinine,U: 139 mg/dL
Microalb Creat Ratio: 0.5 mg/g (ref 0.0–30.0)
Microalb, Ur: 0.7 mg/dL (ref 0.0–1.9)

## 2023-05-25 NOTE — Therapy (Signed)
 OUTPATIENT PHYSICAL THERAPY TREATMENT   Patient Name: Leah Hampton MRN: [id number] DOB:August 10, 1955, 68 y.o., female Today's Date: 05/25/2023  END OF SESSION:  PT End of Session - 05/25/23 1309     Visit Number 3    Number of Visits 12    Date for PT Re-Evaluation 06/27/23    Authorization Type [employer] MCR    PT Start Time 1315    PT Stop Time 1355    PT Time Calculation (min) 40 min              Past Medical History:  Diagnosis Date   Bronchitis    Chronic bronchitis (HCC)    Cystitis    Diabetes type 2, controlled (HCC)    Hematuria    urologice eval, Dr Alfonzo   History of chest pain    Hyperbilirubinemia    Hyperlipemia    Hypertelorism    Hypertension    Leukocytosis    Obesity    Osteoarthritis    right knee   Sleep apnea 2012   slight    Vaginal Pap smear, abnormal    Vitamin D  deficiency 02/09/2018   Past Surgical History:  Procedure Laterality Date   CERVICAL POLYPECTOMY  07/15/2020   Procedure: POLYPECTOMY with Myosure;  Surgeon: Corene Coy, MD;  Location: [facility];  Service: Gynecology;;   COLONOSCOPY WITH PROPOFOL  N/A 03/27/2015   Procedure: COLONOSCOPY WITH PROPOFOL ;  Surgeon: Toribio SHAUNNA Cedar, MD;  Location: [facility];  Service: Endoscopy;  Laterality: N/A;   COLONOSCOPY WITH PROPOFOL  N/A 10/02/2020   Procedure: COLONOSCOPY WITH PROPOFOL ;  Surgeon: Cedar Toribio SHAUNNA, MD;  Location: [facility];  Service: Endoscopy;  Laterality: N/A;   COLPOSCOPY N/A 07/15/2020   Procedure: COLPOSCOPY;  Surgeon: Corene Coy, MD;  Location: [facility] OR;  Service: Gynecology;  Laterality: N/A;   DILATATION & CURETTAGE/HYSTEROSCOPY WITH MYOSURE N/A 01/05/2017   Procedure: DILATATION & CURETTAGE/HYSTEROSCOPY WITH MYOSURE;  Surgeon: Timmie Norris, MD;  Location: [facility];  Service: Gynecology;  Laterality: N/A;  PostMenopausal Bleeding   HYSTEROSCOPY WITH D & C N/A 05/22/2019   Procedure: DILATATION AND CURETTAGE /HYSTEROSCOPY, POLYPECTOMY WITH MYOSURE;   Surgeon: Corene Coy, MD;  Location: [facility];  Service: Gynecology;  Laterality: N/A;   HYSTEROSCOPY WITH D & C N/A 07/15/2020   Procedure: DILATATION AND CURETTAGE /HYSTEROSCOPY;  Surgeon: Corene Coy, MD;  Location: [facility] OR;  Service: Gynecology;  Laterality: N/A;   KNEE ARTHROSCOPY  04/2007   left knee   lap sleeve     TOTAL KNEE ARTHROPLASTY Left 10/2008   Dr Sherryn   UPPER GI ENDOSCOPY N/A 10/14/2020   Procedure: UPPER GI ENDOSCOPY;  Surgeon: Blush Locus, MD;  Location: [facility];  Service: General;  Laterality: N/A;   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   Constipation 11/04/2020   Uncomplicated asthma 11/04/2020   Gastroesophageal reflux disease 11/04/2020   Primary osteoarthritis of both hips 09/30/2020   History of total left knee replacement 07/29/2020   Endometrial polyp 07/15/2020   Low grade squamous intraepithelial lesion (LGSIL) on cervical Pap smear 07/07/2020   Upper abdominal pain 08/20/2019   Right hip pain 05/28/2019   Post-menopausal bleeding 05/16/2019   Greater trochanteric pain syndrome of left lower extremity 03/20/2019   Type 2 diabetes mellitus without complication, without long-term current use of insulin  (HCC) 05/18/2018   Vitamin D  deficiency 02/09/2018   Onychomycosis 09/19/2017  Tinea pedis of both feet 09/19/2017   Obstructive chronic bronchitis with exacerbation (HCC) 05/23/2017   Mixed hyperlipidemia 05/01/2014   Genital herpes 05/23/2012   Stress incontinence 03/04/2012   Osteoarthritis of right knee 10/12/2011   CTS (carpal tunnel syndrome) 07/13/2011   OSA (obstructive sleep apnea) 12/10/2010   Severe obesity (BMI >= 40) (HCC) 07/31/2008   Essential hypertension 07/03/2008    PCP: Daryl Setter, NP   REFERRING PROVIDER: Marcene Benedict ORN, MD   REFERRING DIAG:  [phone] (ICD-10-CM) -  Osteoarthritis of left hip, unspecified osteoarthritis type  S76.012A (ICD-10-CM) - Muscle strain of left gluteal region, initial encounter    THERAPY DIAG:  Pain in left hip  Difficulty in walking, not elsewhere classified  Pain in right hip  Muscle weakness (generalized)  Rationale for Evaluation and Treatment: Rehabilitation  ONSET DATE: ongoing for a few months  SUBJECTIVE:   SUBJECTIVE STATEMENT:  I did the exercises yesterday. I have a ball to use.    PERTINENT HISTORY: OA, Sleep apnea, obesity, HTN, Hyperlipidemia, DMII, chronic bronchitis, TKA Left,   PAIN:  Are you having pain? Yes: NPRS scale: 2/10 Pain location: Left hip strain, Right hip pain  R knee pain Pain description: throbbing hurting  Aggravating factors: standing to wash dishes 15 min, lying on Right hip, going up and down steps.  My left is better with stuff but my right is really hurting Relieving factors: heating  pad and medicine.  PRECAUTIONS: None  RED FLAGS: None   WEIGHT BEARING RESTRICTIONS: No  FALLS:  Has patient fallen in last 6 months? 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 And be able to to go up and down step without pain and . Now I do one step at a time.   NEXT MD VISIT: TBD  OBJECTIVE:  Note: Objective measures were completed at Evaluation unless otherwise noted.  DIAGNOSTIC FINDINGS: FINDINGS: Mild left hip joint space narrowing with acetabular spurring. The femoral head is well seated. No fracture. No erosion, evidence of avascular necrosis or periostitis. No visible focal bone lesion. Mild to moderate degenerative change of the pubic symphysis and both sacroiliac joints. Occasional pelvic phleboliths. Soft tissues  are unremarkable.   IMPRESSION: 1. Mild left hip osteoarthritis. 2. Mild to moderate degenerative change of the pubic symphysis and both sacroiliac joints.     Electronically Signed   By: Andrea Gasman M.D.   On: 04/28/2023 13:53  PATIENT SURVEYS:  FOTO 55% predicted 68 %  COGNITION: Overall cognitive status: Within functional limits for tasks assessed     SENSATION: WFL  EDEMA:  Circumferential: 47 cm, 49.5 cm  R/L  MUSCLE LENGTH: Hamstrings: Right 56 deg; Left 62 deg Thomas test: Bil tightness  + ober test  POSTURE: rounded shoulders, forward head, flexed trunk , and weight shift left  PALPATION: Left  hip TTP under lateral iliac crest.  R hip marked TTP over Right gluteal just under iliac crest and piriformis.  Right knee with slight swelling and OA as per MD report- possible TKA Right in future.  LOWER EXTREMITY ROM:  Active ROM Right eval Left eval  Hip flexion 105 supine 100 supine  Hip extension    Hip abduction    Hip adduction    Hip  internal rotation 20 20  Hip external rotation 45 45  Knee flexion 70 hard end feel A 115/P120  Knee extension -15 -3  Ankle dorsiflexion    Ankle plantarflexion    Ankle inversion    Ankle eversion     (Blank rows = not tested)  LOWER EXTREMITY MMT:  MMT Right eval Left eval  Hip flexion 4 4  Hip extension 4 4-  Hip abduction 4- 4-  Hip adduction    Hip internal rotation    Hip external rotation    Knee flexion 4 4+  Knee extension 4 4+  Ankle dorsiflexion    Ankle plantarflexion    Ankle inversion    Ankle eversion     (Blank rows = not tested)  LOWER EXTREMITY SPECIAL TESTS:   + Ober - FABER -FADIR - Scour test FUNCTIONAL TESTS:  5 times sit to stand: 9.44 sec  6 minute walk test: walk test  237ft  ( Norm for age 17 ft)          SL balance on left  5 sec,  Right 0 sec due to pain  Squats with 60 degree hip flexion and forward trunk GAIT: Distance walked: 245 2 MWT Assistive device  utilized:  no AD Level of assistance: Complete Independence Comments: + Trendelenberg                                                                                                                                TREATMENT  [facility] Adult PT Treatment:                                                DATE: 05/25/23 Therapeutic Exercise: Nustep L5 UE/LE x 5 minutes  Standing hpi abduction 4 inch lateral step up with hip abduction Side stepping  at counter 4 passes  Forward high knee walking along counter 4 passes  STS x 7 - cues for equal feet placement  Seated ball squeeze  Seated clam GTB Seated LAQ alternating 10 x 2  Seated h/s stretch  Supine wide based LTR  Supine with legs over ball bridge 2 x 10    [facility] Adult PT Treatment:                                                DATE: 05/23/2023 Therapeutic Exercise: Seated adductor isometric 1 x 10 holding 5 sec.  Seated hip abduction 2 x 10 hodling 1 sec with GTB Sit to stand from high-low 2 x 10 - lowering table between sets - verbal cues to keep feet parallel vs pulling the LLE back and the RLE forward Standing hip abduction  2  x 10 bil with HHA on back of chair for stability  Reviewed HEP and discussed frequency and benefits of consistency Manual Therapy: MTPR along the R glute med, and used tennis ball to help relieve tension for treatment at home.  Neuromuscular re-ed: Gait training using trekking poles 3 x 60 ft demonstration for proper form.   DATE: 05-16-23 Eval and issue HEP and TPDN RX Trigger Point Dry-Needling  Treatment instructions: Expect mild to moderate muscle soreness. S/S of pneumothorax if dry needled over a lung field, and to seek immediate medical attention should they occur. Patient verbalized understanding of these instructions and education.  Patient Consent Given: Yes Education handout provided: Yes Muscles treated: Right gluteals, piriformis Electrical stimulation performed: No Parameters: N/A Treatment  response/outcome: Twitch response and relieved muscle tension     PATIENT EDUCATION:  Education details: POC ,FOTO report, issue HEP, TPDN education  Person educated: Patient Education method: Explanation, Demonstration, Tactile cues, Verbal cues, and Handouts Education comprehension: verbalized understanding, returned demonstration, verbal cues required, tactile cues required, and needs further education  HOME EXERCISE PROGRAM: Access Code: [email or web address] URL: [email or web address] Date: 05/16/2023 Prepared by: Graydon Dingwall  Exercises - Seated Isometric Hip Adduction with Mercer  - 1 x daily - 7 x weekly - 3 sets - 10 reps - Sit to stand with sink support Movement snack  - 1 x daily - 7 x weekly - 3 sets - 10 reps - Seated Hip Abduction with Resistance  - 1 x daily - 7 x weekly - 3 sets - 10 reps - 3 hold - Seated Hamstring Stretch  - 1-2 x daily - 7 x weekly - 1 sets - 3 reps - 30 sec hold - Standing Hip Abduction with Counter Support  - 1 x daily - 7 x weekly - 3 sets - 10 reps  ASSESSMENT:  CLINICAL IMPRESSION: 05/25/2023 Pt reports she completed her HEP yesterday. She has a small ball for her HEP and it was inflated for her in clinic. Continued with gross hip strengthening and review of HEP. Used ball for bridge modification as pt is unable to flex right knee beyond 70 degrees. Encouraged increased sets as tolerated for HEP. No significant change in subjective report as pt has just started her HEP this week.    Evaluation: Patient is a 68 y.o. female who was seen today for physical therapy evaluation and treatment for Left   gluteal muscle strain , Right hip pain/gluteal/piriformis pain and Right knee OA pain.  Pt with + trendelenberg and has difficulty walking. Pt lives alone and needs to be fully functioning.  Pt has lost weight and has had a L TKA and is preparing and trying to exercise in order to prepare for future surgery. Pt has a gym in apartment complex and reports  that she tries to go at least 3-5 x a week.  Pt has a fear of falling although does not report any falls and will benefit from skilled PT to address impairment and be educated on floor to standing transfers and to have ability to rise if necessary since she lives alone.  Pt will benefit from skilled PT to address weaknesses in bil hips and LE's and fall preparedness.   OBJECTIVE IMPAIRMENTS: decreased activity tolerance, decreased endurance, decreased mobility, difficulty walking, decreased ROM, decreased strength, increased fascial restrictions, obesity, and pain.   ACTIVITY LIMITATIONS: standing, squatting, sleeping, stairs, and locomotion level  PARTICIPATION LIMITATIONS: meal prep, cleaning, laundry, shopping, and community activity  PERSONAL FACTORS: OA,  Sleep apnea, obesity, HTN, Hyperlipidemia, DMII, chronic bronchitis, TKA Left,  are also affecting patient's functional outcome.   REHAB POTENTIAL: Good  CLINICAL DECISION MAKING: Evolving/moderate complexity  EVALUATION COMPLEXITY: Moderate   GOALS: Goals reviewed with patient? Yes  SHORT TERM GOALS: Target date: 06-06-23  Pt be independent with initial HEP  Baseline:no knowledge Goal status: INITIAL  2.  Report being able to sleep 50% better due to pain, comfort Baseline: unable to sleep on Right side and sleep affected Goal status: INITIAL  3.  Pt will perform 6 MWT  for at least 800 feet Baseline: 5 times sit to stand: 9.44 sec  6 minute walk test: walk test  266ft  ( Norm for age 52 ft) Goal status: INITIAL  4.  Pain in Right and left hip will be reduced at least 25 % from eval Baseline: 10/10 after walking longer than  15  minutes Goal status: INITIAL    LONG TERM GOALS: Target date: 06-27-23  Pt will be independent with advanced HEP Baseline: no knowledge Goal status: INITIAL  2.  Pt will tolerate standing and walking for 1 hours without increased pain in hips in order to perform household  chores Baseline: can only walk for 15 min before 10/10 pain Goal status: INITIAL  3.  FOTO will improve from  55%  to  68%   indicating improved functional mobility.  Baseline: EVAL 55% Goal status: INITIAL  4.  Pt will improve Bil LE strength to >/= 4+/5 with </= 2/10 pain to promote safety with walking/standing activities Baseline:  Goal status: INITIAL  5.  Pt will demonstrates ability to rise from floor to standing to insure safety as pt lives alone Baseline: Pt fearful of falling but has thus far not fallen Goal status: INITIAL  6.  Pt will be educated on proper execution of exercises on gym equipment available in her apartment gym with safety. Baseline: limited knowledge Goal status: INITIAL   PLAN:  PT FREQUENCY: 1-2x/week  PT DURATION: 6 weeks  PLANNED INTERVENTIONS: 97164- PT Re-evaluation, 97110-Therapeutic exercises, 97530- Therapeutic activity, [id number]- Neuromuscular re-education, 97535- Self Care, [id number]- Manual therapy, [id number]- Gait training, 97014- Electrical stimulation (unattended), [phone]- Electrical stimulation (manual), Patient/Family education, Balance training, Stair training, Taping, Dry Needling, Joint mobilization, Spinal mobilization, Cryotherapy, and Moist heat  PLAN FOR NEXT SESSION: Check on TPDN done on  Right hip.  Bil hip strengthening. Try IT band stretch and ober stretching   Harlene Persons, PTA 05/25/23 1:55 PM Phone: [phone] Fax: [phone]

## 2023-06-01 ENCOUNTER — Encounter: Payer: Self-pay | Admitting: Physical Therapy

## 2023-06-01 ENCOUNTER — Ambulatory Visit: Payer: 59 | Admitting: Physical Therapy

## 2023-06-01 DIAGNOSIS — M25552 Pain in left hip: Secondary | ICD-10-CM

## 2023-06-01 DIAGNOSIS — R262 Difficulty in walking, not elsewhere classified: Secondary | ICD-10-CM

## 2023-06-01 DIAGNOSIS — M25551 Pain in right hip: Secondary | ICD-10-CM | POA: Diagnosis not present

## 2023-06-01 DIAGNOSIS — M6281 Muscle weakness (generalized): Secondary | ICD-10-CM

## 2023-06-01 NOTE — Therapy (Addendum)
 OUTPATIENT PHYSICAL THERAPY TREATMENT/DISCHARGE NOTE  PHYSICAL THERAPY DISCHARGE SUMMARY  Visits from Start of Care: 4  Current functional level related to goals / functional outcomes: unknown   Remaining deficits: unknown   Education / Equipment: Initial HEP   Patient agrees to discharge. Patient goals were not met. Patient is being discharged due to not returning since the last visit.    Patient Name: Leah Hampton MRN: [id number] DOB:1956/02/08, 68 y.o., female Today's Date: 06/01/2023  END OF SESSION:  PT End of Session - 06/01/23 1101     Visit Number 4    Number of Visits 12    Date for PT Re-Evaluation 06/27/23    Authorization Type [employer] MCR    PT Start Time 1100    PT Stop Time 1153    PT Time Calculation (min) 53 min    Activity Tolerance Patient tolerated treatment well    Behavior During Therapy WFL for tasks assessed/performed               Past Medical History:  Diagnosis Date   Bronchitis    Chronic bronchitis (HCC)    Cystitis    Diabetes type 2, controlled (HCC)    Hematuria    urologice eval, Dr Lindley Magnus   History of chest pain    Hyperbilirubinemia    Hyperlipemia    Hypertelorism    Hypertension    Leukocytosis    Obesity    Osteoarthritis    right knee   Sleep apnea 2012   slight    Vaginal Pap smear, abnormal    Vitamin D deficiency 02/09/2018   Past Surgical History:  Procedure Laterality Date   CERVICAL POLYPECTOMY  07/15/2020   Procedure: POLYPECTOMY with Myosure;  Surgeon: Willodean Rosenthal, MD;  Location: [facility] OR;  Service: Gynecology;;   COLONOSCOPY WITH PROPOFOL N/A 03/27/2015   Procedure: COLONOSCOPY WITH PROPOFOL;  Surgeon: Rachael Fee, MD;  Location: [facility]COPY;  Service: Endoscopy;  Laterality: N/A;   COLONOSCOPY WITH PROPOFOL N/A 10/02/2020   Procedure: COLONOSCOPY WITH PROPOFOL;  Surgeon: Rachael Fee, MD;  Location: [facility];  Service: Endoscopy;  Laterality: N/A;   COLPOSCOPY N/A 07/15/2020    Procedure: COLPOSCOPY;  Surgeon: Willodean Rosenthal, MD;  Location: [facility] OR;  Service: Gynecology;  Laterality: N/A;   DILATATION & CURETTAGE/HYSTEROSCOPY WITH MYOSURE N/A 01/05/2017   Procedure: DILATATION & CURETTAGE/HYSTEROSCOPY WITH MYOSURE;  Surgeon: Geryl Rankins, MD;  Location: [facility] ORS;  Service: Gynecology;  Laterality: N/A;  PostMenopausal Bleeding   HYSTEROSCOPY WITH D & C N/A 05/22/2019   Procedure: DILATATION AND CURETTAGE /HYSTEROSCOPY, POLYPECTOMY WITH MYOSURE;  Surgeon: Willodean Rosenthal, MD;  Location: [facility] ORS;  Service: Gynecology;  Laterality: N/A;   HYSTEROSCOPY WITH D & C N/A 07/15/2020   Procedure: DILATATION AND CURETTAGE /HYSTEROSCOPY;  Surgeon: Willodean Rosenthal, MD;  Location: [facility] OR;  Service: Gynecology;  Laterality: N/A;   KNEE ARTHROSCOPY  04/2007   left knee   lap sleeve     TOTAL KNEE ARTHROPLASTY Left 10/2008   Dr Irine Seal   UPPER GI ENDOSCOPY N/A 10/14/2020   Procedure: UPPER GI ENDOSCOPY;  Surgeon: Gaynelle Adu, MD;  Location: [facility];  Service: General;  Laterality: N/A;   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   Constipation 11/04/2020   Uncomplicated asthma 11/04/2020   Gastroesophageal reflux disease 11/04/2020   Primary osteoarthritis of both hips 09/30/2020  History of total left knee replacement 07/29/2020   Endometrial polyp 07/15/2020   Low grade squamous intraepithelial lesion (LGSIL) on cervical Pap smear 07/07/2020   Upper abdominal pain 08/20/2019   Right hip pain 05/28/2019   Post-menopausal bleeding 05/16/2019   Greater trochanteric pain syndrome of left lower extremity 03/20/2019   Type 2 diabetes mellitus without complication, without long-term current use of insulin (HCC) 05/18/2018   Vitamin D deficiency 02/09/2018   Onychomycosis 09/19/2017   Tinea pedis of both feet 09/19/2017   Obstructive chronic  bronchitis with exacerbation (HCC) 05/23/2017   Mixed hyperlipidemia 05/01/2014   Genital herpes 05/23/2012   Stress incontinence 03/04/2012   Osteoarthritis of right knee 10/12/2011   CTS (carpal tunnel syndrome) 07/13/2011   OSA (obstructive sleep apnea) 12/10/2010   Severe obesity (BMI >= 40) (HCC) 07/31/2008   Essential hypertension 07/03/2008    PCP: Sandford Craze, NP   REFERRING PROVIDER: Burna Forts, MD   REFERRING DIAG:  [phone] (ICD-10-CM) - Osteoarthritis of left hip, unspecified osteoarthritis type  S76.012A (ICD-10-CM) - Muscle strain of left gluteal region, initial encounter    THERAPY DIAG:  Pain in left hip  Difficulty in walking, not elsewhere classified  Pain in right hip  Muscle weakness (generalized)  Rationale for Evaluation and Treatment: Rehabilitation  ONSET DATE: ongoing for a few months  SUBJECTIVE:   SUBJECTIVE STATEMENT: "I am doing good, I feel like I am getting better a little bit."  PERTINENT HISTORY: OA, Sleep apnea, obesity, HTN, Hyperlipidemia, DMII, chronic bronchitis, TKA Left,   PAIN:  Are you having pain? Yes: NPRS scale: 0/10 Pain location: Left hip strain, Right hip pain  R knee pain Pain description: throbbing hurting  Aggravating factors: standing to wash dishes 15 min, lying on Right hip, going up and down steps.  My left is better with stuff but my right is really hurting Relieving factors: heating  pad and medicine.  PRECAUTIONS: None  RED FLAGS: None   WEIGHT BEARING RESTRICTIONS: No  FALLS:  Has patient fallen in last 6 months? 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 And be able to to go up and down step without pain and . Now I do one step at a time.    NEXT MD VISIT: TBD  OBJECTIVE:  Note: Objective measures were completed at Evaluation unless otherwise noted.  DIAGNOSTIC FINDINGS: FINDINGS: Mild left hip joint space narrowing with acetabular spurring. The femoral head is well seated. No fracture. No erosion, evidence of avascular necrosis or periostitis. No visible focal bone lesion. Mild to moderate degenerative change of the pubic symphysis and both sacroiliac joints. Occasional pelvic phleboliths. Soft tissues are unremarkable.   IMPRESSION: 1. Mild left hip osteoarthritis. 2. Mild to moderate degenerative change of the pubic symphysis and both sacroiliac joints.     Electronically Signed   By: Narda Rutherford M.D.   On: 04/28/2023 13:53  PATIENT SURVEYS:  FOTO 55% predicted 68 %  COGNITION: Overall cognitive status: Within functional limits for tasks assessed     SENSATION: WFL  EDEMA:  Circumferential: 47 cm, 49.5 cm  R/L  MUSCLE LENGTH: Hamstrings: Right 56 deg; Left 62 deg Thomas test: Bil tightness  + ober test  POSTURE: rounded shoulders, forward head, flexed trunk ,  and weight shift left  PALPATION: Left  hip TTP under lateral iliac crest.  R hip marked TTP over Right gluteal just under iliac crest and piriformis.  Right knee with slight swelling and OA as per MD report- possible TKA Right in future.  LOWER EXTREMITY ROM:  Active ROM Right eval Left eval  Hip flexion 105 supine 100 supine  Hip extension    Hip abduction    Hip adduction    Hip internal rotation 20 20  Hip external rotation 45 45  Knee flexion 70 hard end feel A 115/P120  Knee extension -15 -3  Ankle dorsiflexion    Ankle plantarflexion    Ankle inversion    Ankle eversion     (Blank rows = not tested)  LOWER EXTREMITY MMT:  MMT Right eval Left eval  Hip flexion 4 4  Hip extension 4 4-  Hip abduction 4- 4-  Hip adduction    Hip internal rotation    Hip external rotation    Knee flexion 4 4+  Knee extension 4  4+  Ankle dorsiflexion    Ankle plantarflexion    Ankle inversion    Ankle eversion     (Blank rows = not tested)  LOWER EXTREMITY SPECIAL TESTS:   + Ober - FABER -FADIR - Scour test FUNCTIONAL TESTS:  5 times sit to stand: 9.44 sec  6 minute walk test: walk test  268ft  ( Norm for age 59 ft)          SL balance on left  5 sec,  Right 0 sec due to pain  Squats with 60 degree hip flexion and forward trunk GAIT: Distance walked: 245 2 MWT Assistive device utilized:  no AD Level of assistance: Complete Independence Comments: + Trendelenberg                                                                                                                                TREATMENT  [facility] Adult PT Treatment:                                                DATE: 06/01/2023 Therapeutic Exercise: Nu-step L5 x 5 min UE/LE  Standing hip abduction 2 x 10 Standing hip hikes on 4 in  step Manual Therapy: MTPR with tennis ball  Neuromuscular re-ed: Gait training fWD/ BWD along counter with 1 Hand on counter for support x 8 ea, focus on heel strike/ toe off bil with mod cues for proper form. 80 ft heel strike toe off bil mod verbal cues/ demonstration for proper form    [facility] Adult PT Treatment:  DATE: 05/25/23 Therapeutic Exercise: Nustep L5 UE/LE x 5 minutes  Standing hpi abduction 4 inch lateral step up with hip abduction Side stepping  at counter 4 passes  Forward high knee walking along counter 4 passes  STS x 7 - cues for equal feet placement  Seated ball squeeze  Seated clam GTB Seated LAQ alternating 10 x 2  Seated h/s stretch  Supine wide based LTR  Supine with legs over ball bridge 2 x 10  [facility] Adult PT Treatment:                                                DATE: 05/23/2023 Therapeutic Exercise: Seated adductor isometric 1 x 10 holding 5 sec.  Seated hip abduction 2 x 10 hodling 1 sec with GTB Sit to stand from high-low 2  x 10 - lowering table between sets - verbal cues to keep feet parallel vs pulling the LLE back and the RLE forward Standing hip abduction  2 x 10 bil with HHA on back of chair for stability  Reviewed HEP and discussed frequency and benefits of consistency Manual Therapy: MTPR along the R glute med, and used tennis ball to help relieve tension for treatment at home.  Neuromuscular re-ed: Gait training using trekking poles 3 x 60 ft demonstration for proper form.     PATIENT EDUCATION:  Education details: POC ,FOTO report, issue HEP, TPDN education  Person educated: Patient Education method: Explanation, Demonstration, Tactile cues, Verbal cues, and Handouts Education comprehension: verbalized understanding, returned demonstration, verbal cues required, tactile cues required, and needs further education  HOME EXERCISE PROGRAM: Access Code: [email or web address] URL: [email or web address] Date: 05/16/2023 Prepared by: Garen Lah  Exercises - Seated Isometric Hip Adduction with Newman Pies  - 1 x daily - 7 x weekly - 3 sets - 10 reps - Sit to stand with sink support Movement snack  - 1 x daily - 7 x weekly - 3 sets - 10 reps - Seated Hip Abduction with Resistance  - 1 x daily - 7 x weekly - 3 sets - 10 reps - 3 hold - Seated Hamstring Stretch  - 1-2 x daily - 7 x weekly - 1 sets - 3 reps - 30 sec hold - Standing Hip Abduction with Counter Support  - 1 x daily - 7 x weekly - 3 sets - 10 reps  ASSESSMENT:  CLINICAL IMPRESSION: 06/01/2023 Leah Hampton presents to PT today noting overall she is doing very well with no pain in the hip. But does report some discomfort in her knee which she didn't quantify a numeric pain score for. Continued to review MTPR techniques per pt request in standing, and continued working on hip strengthening which she did well with. Increased time was used to focus on gait training utilize heel strike / toe off mod verbal cues utilzed as well as demonstration which she  was able to demonstrate and perform. End of session she noted that she did feel pretty good still. She has one more visit scheduled and opted to see about potentially discharging vs scheduling more due to challenges with scheduling transportation.    Evaluation: Patient is a 68 y.o. female who was seen today for physical therapy evaluation and treatment for Left   gluteal muscle strain , Right hip pain/gluteal/piriformis pain and Right knee OA pain.  Pt with + trendelenberg and  has difficulty walking. Pt lives alone and needs to be fully functioning.  Pt has lost weight and has had a L TKA and is preparing and trying to exercise in order to prepare for future surgery. Pt has a gym in apartment complex and reports that she tries to go at least 3-5 x a week.  Pt has a fear of falling although does not report any falls and will benefit from skilled PT to address impairment and be educated on floor to standing transfers and to have ability to rise if necessary since she lives alone.  Pt will benefit from skilled PT to address weaknesses in bil hips and LE's and fall preparedness.   OBJECTIVE IMPAIRMENTS: decreased activity tolerance, decreased endurance, decreased mobility, difficulty walking, decreased ROM, decreased strength, increased fascial restrictions, obesity, and pain.   ACTIVITY LIMITATIONS: standing, squatting, sleeping, stairs, and locomotion level  PARTICIPATION LIMITATIONS: meal prep, cleaning, laundry, shopping, and community activity  PERSONAL FACTORS: OA, Sleep apnea, obesity, HTN, Hyperlipidemia, DMII, chronic bronchitis, TKA Left,  are also affecting patient's functional outcome.   REHAB POTENTIAL: Good  CLINICAL DECISION MAKING: Evolving/moderate complexity  EVALUATION COMPLEXITY: Moderate   GOALS: Goals reviewed with patient? Yes  SHORT TERM GOALS: Target date: 06-06-23  Pt be independent with initial HEP  Baseline:no knowledge Goal status: INITIAL  2.  Report being able  to sleep 50% better due to pain, comfort Baseline: unable to sleep on Right side and sleep affected Goal status: INITIAL  3.  Pt will perform 6 MWT  for at least 800 feet Baseline: 5 times sit to stand: 9.44 sec  6 minute walk test: walk test  252ft  ( Norm for age 69 ft) Goal status: INITIAL  4.  Pain in Right and left hip will be reduced at least 25 % from eval Baseline: 10/10 after walking longer than  15  minutes Goal status: INITIAL    LONG TERM GOALS: Target date: 06-27-23  Pt will be independent with advanced HEP Baseline: no knowledge Goal status: INITIAL  2.  Pt will tolerate standing and walking for 1 hours without increased pain in hips in order to perform household chores Baseline: can only walk for 15 min before 10/10 pain Goal status: INITIAL  3.  FOTO will improve from  55%  to  68%   indicating improved functional mobility.  Baseline: EVAL 55% Goal status: INITIAL  4.  Pt will improve Bil LE strength to >/= 4+/5 with </= 2/10 pain to promote safety with walking/standing activities Baseline:  Goal status: INITIAL  5.  Pt will demonstrates ability to rise from floor to standing to insure safety as pt lives alone Baseline: Pt fearful of falling but has thus far not fallen Goal status: INITIAL  6.  Pt will be educated on proper execution of exercises on gym equipment available in her apartment gym with safety. Baseline: limited knowledge Goal status: INITIAL   PLAN:  PT FREQUENCY: 1-2x/week  PT DURATION: 6 weeks  PLANNED INTERVENTIONS: 97164- PT Re-evaluation, 97110-Therapeutic exercises, 97530- Therapeutic activity, [id number]- Neuromuscular re-education, 97535- Self Care, 52841- Manual therapy, [id number]- Gait training, 97014- Electrical stimulation (unattended), [phone]- Electrical stimulation (manual), Patient/Family education, Balance training, Stair training, Taping, Dry Needling, Joint mobilization, Spinal mobilization, Cryotherapy, and Moist heat  PLAN  FOR NEXT SESSION: Check on TPDN done on  Right hip.  Bil hip strengthening. Try IT band stretch and ober stretching   Aleia Larocca PT, DPT, LAT, ATC  06/01/23  12:00 PM  Garen Lah, PT, ATRIC Certified Exercise Expert for the Aging Adult  07/14/23 12:13 PM Phone: [phone] Fax: [phone]

## 2023-06-08 ENCOUNTER — Ambulatory Visit: Payer: 59 | Admitting: Physical Therapy

## 2023-06-17 ENCOUNTER — Other Ambulatory Visit: Payer: Self-pay | Admitting: Family

## 2023-07-14 DIAGNOSIS — E118 Type 2 diabetes mellitus with unspecified complications: Secondary | ICD-10-CM | POA: Diagnosis not present

## 2023-07-14 DIAGNOSIS — G4733 Obstructive sleep apnea (adult) (pediatric): Secondary | ICD-10-CM | POA: Diagnosis not present

## 2023-07-14 DIAGNOSIS — K439 Ventral hernia without obstruction or gangrene: Secondary | ICD-10-CM | POA: Diagnosis not present

## 2023-07-14 DIAGNOSIS — M1711 Unilateral primary osteoarthritis, right knee: Secondary | ICD-10-CM | POA: Diagnosis not present

## 2023-07-14 DIAGNOSIS — Z9884 Bariatric surgery status: Secondary | ICD-10-CM | POA: Diagnosis not present

## 2023-07-14 DIAGNOSIS — I10 Essential (primary) hypertension: Secondary | ICD-10-CM | POA: Diagnosis not present

## 2023-07-18 ENCOUNTER — Ambulatory Visit (INDEPENDENT_AMBULATORY_CARE_PROVIDER_SITE_OTHER): Payer: 59 | Admitting: Orthopaedic Surgery

## 2023-07-18 VITALS — Ht 63.0 in | Wt 217.0 lb

## 2023-07-18 DIAGNOSIS — M1711 Unilateral primary osteoarthritis, right knee: Secondary | ICD-10-CM | POA: Diagnosis not present

## 2023-07-18 NOTE — Progress Notes (Signed)
 The patient is a 68 year old that I am seeing for the first time.  She is sent to me to evaluate and treat severe end-stage arthritis of her right knee.  She has a remote history of a left knee replacement done by a surgeon down who is since retired.  That knee was replaced I believe in 2010.  She is having a little bit of difficulty with that but her right knee has been significantly painful for her.  She does ambulate using a cane.  She is morbidly obese with a BMI of 38.44 but she has lost weight.  She is a diabetic but has excellent control of her blood glucose with her last A1c a month ago being 5.9.  At this point her right knee pain is 10 out of 10 and it is daily.  It is definitely affecting her mobility, her quality of life and her actives daily living to the point she does wish proceed with a knee replacement on her right knee.  There are x-rays on the canopy system for me to review.  Also reviewed all of her medications and past medical history within epic.  On exam she has a significant valgus deformity of her right knee and when she walks it is definitely putting a lot of pressure on the right knee.  Her left knee seems to move smoothly and fluidly but she is concerned about that knee wearing out.  I do not have any x-rays of that knee.  The right knee has significant valgus malalignment that is not correctable.  There is significant patellofemoral crepitation and pain throughout the arc of motion of the right knee.   X-rays of the right knee show significant varus malalignment with bone-on-bone wear of all 3 compartments with basically all compartments having bone-on-bone wear with large osteophytes in all 3 compartments and significant and severe arthritic changes.  This is significantly worsened over the last several years when comparing to previous x-rays.  We had a long and thorough discussion about knee replacement surgery.  I agree that she is in need of this type of surgery given the  severe deformity of her knee and the pain that this is causing her with her right knee.  At some point we will need to evaluate her left knee replacement.  I did go over knee replacement model with her again and went over her x-rays.  We discussed the risks and benefits of surgery and what to expect from an intraoperative and postoperative standpoint.  We will work on scheduling her in the near future for a right total knee arthroplasty.

## 2023-08-10 ENCOUNTER — Telehealth: Payer: Self-pay | Admitting: Orthopaedic Surgery

## 2023-08-10 NOTE — Telephone Encounter (Signed)
 Patient requests rolling walker, I sent Rx to [facility] for this

## 2023-08-10 NOTE — Telephone Encounter (Signed)
 Patient called and ask for a prescription for a walker. [phone]

## 2023-08-18 ENCOUNTER — Telehealth: Payer: Self-pay | Admitting: Orthopaedic Surgery

## 2023-08-18 NOTE — Telephone Encounter (Signed)
 Patient aware we will give her this in the hospital after surgery

## 2023-08-18 NOTE — Telephone Encounter (Signed)
 Patient called. Would like to know if she could get a RX for a walker?

## 2023-08-21 NOTE — Progress Notes (Signed)
 COVID Vaccine received:  []  No [x]  Yes Date of any COVID positive Test in last 90 days:  PCP - Sandford Craze, NP   Danise Edge, MD Cardiologist - Epifanio Lesches, MD   Chest x-ray - 02-12-2010  2v  Epic EKG -  10-12-2022  Epic Stress Test - Eugenie Birks 08-28-2020  Epic ECHO - 03-18-2020  Epic Cardiac Cath -  CT cardiac calcium score of 0 on 08-27-2020  Epic  PCR screen: [x]  Ordered & Completed []   No Order but Needs PROFEND     []   N/A for this surgery  Surgery Plan:  []  Ambulatory   [x]  Outpatient in bed  []  Admit Anesthesia:    []  General  [x]  Spinal  []   Choice []   MAC  Pacemaker / ICD device [x]  No []  Yes   Spinal Cord Stimulator:[x]  No []  Yes       History of Sleep Apnea? []  No [x]  Yes   CPAP used?- [x]  No []  Yes    Does the patient monitor blood sugar?   []  N/A   [x]  No []  Yes  Patient has: []  NO Hx DM   [x]  Pre-DM   []  DM1  []   DM2 Last A1c was: 5.9 on  05-24-23  Does patient have a [employer] or Dexcom? [x]  No []  Yes   Fasting Blood Sugar Ranges-  Checks Blood Sugar __0 times a day Tirzepatide Greggory Keen) Wednesdays  Last dose: 08-17-23  patient is aware not to take 08-24-23  Blood Thinner / Instructions: none Aspirin Instructions:  none  ERAS Protocol Ordered: []  No  [x]  Yes PRE-SURGERY []  ENSURE  [x]  G2 Patient is to be NPO after: 0945  Dental hx: []  Dentures:  [x]  N/A      []  Bridge or Partial:                   []  Loose or Damaged teeth:   Comments: Patient was given the 5 CHG shower / bath instructions for TKA surgery along with 2 bottles of the CHG soap. Patient will start this on:  08-22-2023  All questions were asked and answered, Patient voiced understanding of this process.   Activity level: Patient is able to climb a flight of stairs without difficulty; [x]  No CP  [x]  No SOB, but would have leg pain.  Patient can perform ADLs without assistance.   Anesthesia review: HTN, asthma, GERD, OSA- no CPAP, Pre-DM, s/p gastric sleeve 10-14-2020  Patient  denies shortness of breath, fever, cough and chest pain at PAT appointment.  Patient verbalized understanding and agreement to the Pre-Surgical Instructions that were given to them at this PAT appointment. Patient was also educated of the need to review these PAT instructions again prior to her surgery.I reviewed the appropriate phone numbers to call if they have any and questions or concerns.

## 2023-08-21 NOTE — Patient Instructions (Signed)
 SURGICAL WAITING ROOM VISITATION Patients having surgery or a procedure may have no more than 2 support people in the waiting area - these visitors may rotate in the visitor waiting room.   If the patient needs to stay at the hospital during part of their recovery, the visitor guidelines for inpatient rooms apply.  PRE-OP VISITATION  Pre-op nurse will coordinate an appropriate time for 1 support person to accompany the patient in pre-op.  This support person may not rotate.  This visitor will be contacted when the time is appropriate for the visitor to come back in the pre-op area.  Please refer to the [facility] website for the visitor guidelines for Inpatients (after your surgery is over and you are in a regular room).  You are not required to quarantine at this time prior to your surgery. However, you must do this: Hand Hygiene often Do NOT share personal items Notify your provider if you are in close contact with someone who has COVID or you develop fever 100.4 or greater, new onset of sneezing, cough, sore throat, shortness of breath or body aches.  If you test positive for Covid or have been in contact with anyone that has tested positive in the last 10 days please notify you surgeon.    Your procedure is scheduled on:  FRIDAY  August 26, 2023  Report to [facility] Main Entrance: [facility] entrance where the [employer] is available.   Report to admitting at:  10:15   AM  Call this number if you have any questions or problems the morning of surgery [phone]  Do not eat food after Midnight the night prior to your surgery/procedure.  After Midnight you may have the following liquids until  09:45 AM DAY OF SURGERY  Clear Liquid Diet Water Black Coffee (sugar ok, NO MILK/CREAM OR CREAMERS)  Tea (sugar ok, NO MILK/CREAM OR CREAMERS) regular and decaf                             Plain Jell-O  with no fruit (NO RED)                                           Fruit ices  (not with fruit pulp, NO RED)                                     Popsicles (NO RED)                                                                  Juice: NO CITRUS JUICES: only apple, WHITE grape, WHITE cranberry Sports drinks like Gatorade or Powerade (NO RED)                   The day of surgery:  Drink ONE (1) Pre-Surgery G2 at  09:45  AM the morning of surgery. Drink in one sitting. Do not sip.  This drink was given to you during your hospital pre-op appointment visit. Nothing else to drink after completing  the Pre-Surgery G2 : No candy, chewing gum or throat lozenges.    FOLLOW ANY ADDITIONAL PRE OP INSTRUCTIONS YOU RECEIVED FROM YOUR SURGEON'S OFFICE!!!   Oral Hygiene is also important to reduce your risk of infection.        Remember - BRUSH YOUR TEETH THE MORNING OF SURGERY WITH YOUR REGULAR TOOTHPASTE  Do NOT smoke after Midnight the night before surgery.  STOP TAKING all Vitamins, Herbs and supplements 1 week before your surgery.   Tirzepatide Greggory Keen) injections:  Last injection : Wednesday 08-17-23  DO NOT TAKE LOSARTAN on the morning of your surgery, 08-26-23.   Take ONLY these medicines the morning of surgery with A SIP OF WATER: amlodipine, Valtrex, and you may use your inhalers. Please bring your Albuterol inhaler with you on the day of your surgery.                    You may not have any metal on your body including hair pins, jewelry, and body piercing  Do not wear make-up, lotions, powders, perfumes or deodorant  Do not wear nail polish including gel and S&S, artificial / acrylic nails, or any other type of covering on natural nails including finger and toenails. If you have artificial nails, gel coating, etc., that needs to be removed by a nail salon, Please have this removed prior to surgery. Not doing so may mean that your surgery could be cancelled or delayed if the Surgeon or anesthesia staff feels like they are unable to monitor you safely.   Do not  shave 48 hours prior to surgery to avoid nicks in your skin which may contribute to postoperative infections.   Contacts, Hearing Aids, dentures or bridgework may not be worn into surgery. DENTURES WILL BE REMOVED PRIOR TO SURGERY PLEASE DO NOT APPLY "Poly grip" OR ADHESIVES!!!  You may bring a small overnight bag with you on the day of surgery, only pack items that are not valuable. [facility] IS NOT RESPONSIBLE   FOR VALUABLES THAT ARE LOST OR STOLEN.   Do not bring your home medications to the hospital. The Pharmacy will dispense medications listed on your medication list to you during your admission in the Hospital.  Please read over the following fact sheets you were given: IF YOU HAVE QUESTIONS ABOUT YOUR PRE-OP INSTRUCTIONS, PLEASE CALL [phone].     Pre-operative 5 CHG Bath Instructions   You can play a key role in reducing the risk of infection after surgery. Your skin needs to be as free of germs as possible. You can reduce the number of germs on your skin by washing with CHG (chlorhexidine gluconate) soap before surgery. CHG is an antiseptic soap that kills germs and continues to kill germs even after washing.   DO NOT use if you have an allergy to chlorhexidine/CHG or antibacterial soaps. If your skin becomes reddened or irritated, stop using the CHG and notify one of our RNs at [phone]  Please shower with the CHG soap starting 4 days before surgery using the following schedule: START SHOWERS ON MONDAY  August 22, 2023  Please keep in mind the following:  DO NOT shave, including legs and underarms, starting the day of your first shower.   You may shave your face at any point before/day of surgery.   Place clean sheets on your bed the day you start using CHG soap. Use a clean washcloth (not used since  being washed) for each shower. DO NOT sleep with pets once you start using the CHG.   CHG Shower Instructions:  If you choose to wash your hair and private area, wash first with your normal shampoo/soap.  After you use shampoo/soap, rinse your hair and body thoroughly to remove shampoo/soap residue.  Turn the water OFF and apply about 3 tablespoons (45 ml) of CHG soap to a CLEAN washcloth.  Apply CHG soap ONLY FROM YOUR NECK DOWN TO YOUR TOES (washing for 3-5 minutes)  DO NOT use CHG soap on face, private areas, open wounds, or sores.  Pay special attention to the area where your surgery is being performed.  If you are having back surgery, having someone wash your back for you may be helpful.  Wait 2 minutes after CHG soap is applied, then you may rinse off the CHG soap.  Pat dry with a clean towel  Put on clean clothes/pajamas   If you choose to wear lotion, please use ONLY the CHG-compatible lotions on the back of this paper.     Additional instructions for the day of surgery: DO NOT APPLY any lotions, deodorants, cologne, or perfumes.   Put on clean/comfortable clothes.  Brush your teeth.  Ask your nurse before applying any prescription medications to the skin.      CHG Compatible Lotions   Aveeno Moisturizing lotion  Cetaphil Moisturizing Cream  Cetaphil Moisturizing Lotion  Clairol Herbal Essence Moisturizing Lotion, Dry Skin  Clairol Herbal Essence Moisturizing Lotion, Extra Dry Skin  Clairol Herbal Essence Moisturizing Lotion, Normal Skin  Curel Age Defying Therapeutic Moisturizing Lotion with Alpha Hydroxy  Curel Extreme Care Body Lotion  Curel Soothing Hands Moisturizing Hand Lotion  Curel Therapeutic Moisturizing Cream, Fragrance-Free  Curel Therapeutic Moisturizing Lotion, Fragrance-Free  Curel Therapeutic Moisturizing Lotion, Original Formula  Eucerin Daily Replenishing Lotion  Eucerin Dry Skin Therapy Plus Alpha Hydroxy Crme  Eucerin Dry Skin Therapy Plus  Alpha Hydroxy Lotion  Eucerin Original Crme  Eucerin Original Lotion  Eucerin Plus Crme Eucerin Plus Lotion  Eucerin TriLipid Replenishing Lotion  Keri Anti-Bacterial Hand Lotion  Keri Deep Conditioning Original Lotion Dry Skin Formula Softly Scented  Keri Deep Conditioning Original Lotion, Fragrance Free Sensitive Skin Formula  Keri Lotion Fast Absorbing Fragrance Free Sensitive Skin Formula  Keri Lotion Fast Absorbing Softly Scented Dry Skin Formula  Keri Original Lotion  Keri Skin Renewal Lotion Keri Silky Smooth Lotion  Keri Silky Smooth Sensitive Skin Lotion  Nivea Body Creamy Conditioning Oil  Nivea Body Extra Enriched Lotion  Nivea Body Original Lotion  Nivea Body Sheer Moisturizing Lotion Nivea Crme  Nivea Skin Firming Lotion  NutraDerm 30 Skin Lotion  NutraDerm Skin Lotion  NutraDerm Therapeutic Skin Cream  NutraDerm Therapeutic Skin Lotion  ProShield Protective Hand Cream  Provon moisturizing lotion   FAILURE TO FOLLOW THESE INSTRUCTIONS MAY RESULT IN THE CANCELLATION OF YOUR SURGERY  PATIENT SIGNATURE_________________________________  NURSE SIGNATURE__________________________________  ________________________________________________________________________         Rogelia Mire    An incentive spirometer is a tool that can help keep your lungs clear and active. This tool measures how well you are filling your lungs with each  breath. Taking long deep breaths may help reverse or decrease the chance of developing breathing (pulmonary) problems (especially infection) following: A long period of time when you are unable to move or be active. BEFORE THE PROCEDURE  If the spirometer includes an indicator to show your best effort, your nurse or respiratory therapist will set it to a desired goal. If possible, sit up straight or lean slightly forward. Try not to slouch. Hold the incentive spirometer in an upright position. INSTRUCTIONS FOR USE  Sit on  the edge of your bed if possible, or sit up as far as you can in bed or on a chair. Hold the incentive spirometer in an upright position. Breathe out normally. Place the mouthpiece in your mouth and seal your lips tightly around it. Breathe in slowly and as deeply as possible, raising the piston or the ball toward the top of the column. Hold your breath for 3-5 seconds or for as long as possible. Allow the piston or ball to fall to the bottom of the column. Remove the mouthpiece from your mouth and breathe out normally. Rest for a few seconds and repeat Steps 1 through 7 at least 10 times every 1-2 hours when you are awake. Take your time and take a few normal breaths between deep breaths. The spirometer may include an indicator to show your best effort. Use the indicator as a goal to work toward during each repetition. After each set of 10 deep breaths, practice coughing to be sure your lungs are clear. If you have an incision (the cut made at the time of surgery), support your incision when coughing by placing a pillow or rolled up towels firmly against it. Once you are able to get out of bed, walk around indoors and cough well. You may stop using the incentive spirometer when instructed by your caregiver.  RISKS AND COMPLICATIONS Take your time so you do not get dizzy or light-headed. If you are in pain, you may need to take or ask for pain medication before doing incentive spirometry. It is harder to take a deep breath if you are having pain. AFTER USE Rest and breathe slowly and easily. It can be helpful to keep track of a log of your progress. Your caregiver can provide you with a simple table to help with this. If you are using the spirometer at home, follow these instructions: SEEK MEDICAL CARE IF:  You are having difficultly using the spirometer. You have trouble using the spirometer as often as instructed. Your pain medication is not giving enough relief while using the  spirometer. You develop fever of 100.5 F (38.1 C) or higher.                                                                                                    SEEK IMMEDIATE MEDICAL CARE IF:  You cough up bloody sputum that had not been present before. You develop fever of 102 F (38.9 C) or greater. You develop worsening pain at or near the incision site. MAKE SURE YOU:  Understand these instructions. Will watch  your condition. Will get help right away if you are not doing well or get worse. Document Released: 09/13/2006 Document Revised: 07/26/2011 Document Reviewed: 11/14/2006 [facility] Patient Information 2014 [place], [place].       If you would like to see a video about joint replacement:   [email or web address]

## 2023-08-22 ENCOUNTER — Encounter (HOSPITAL_COMMUNITY): Payer: Self-pay

## 2023-08-22 ENCOUNTER — Other Ambulatory Visit: Payer: Self-pay

## 2023-08-22 ENCOUNTER — Encounter (HOSPITAL_COMMUNITY)
Admission: RE | Admit: 2023-08-22 | Discharge: 2023-08-22 | Disposition: A | Discharge status: Home / Self Care | Source: Ambulatory Visit | Attending: Orthopaedic Surgery | Admitting: Orthopaedic Surgery

## 2023-08-22 VITALS — BP 116/62 | HR 70 | Temp 98.2°F | Resp 16 | Ht 63.0 in | Wt 215.0 lb

## 2023-08-22 DIAGNOSIS — Z79899 Other long term (current) drug therapy: Secondary | ICD-10-CM | POA: Insufficient documentation

## 2023-08-22 DIAGNOSIS — M1711 Unilateral primary osteoarthritis, right knee: Secondary | ICD-10-CM | POA: Diagnosis not present

## 2023-08-22 DIAGNOSIS — Z01812 Encounter for preprocedural laboratory examination: Secondary | ICD-10-CM | POA: Diagnosis not present

## 2023-08-22 DIAGNOSIS — Z01818 Encounter for other preprocedural examination: Secondary | ICD-10-CM

## 2023-08-22 HISTORY — DX: Prediabetes: R73.03

## 2023-08-22 LAB — CBC
HCT: 42.6 % (ref 36.0–46.0)
Hemoglobin: 13.6 g/dL (ref 12.0–15.0)
MCH: 30.8 pg (ref 26.0–34.0)
MCHC: 31.9 g/dL (ref 30.0–36.0)
MCV: 96.6 fL (ref 80.0–100.0)
Platelets: 246 10*3/uL (ref 150–400)
RBC: 4.41 MIL/uL (ref 3.87–5.11)
RDW: 14.6 % (ref 11.5–15.5)
WBC: 7.7 10*3/uL (ref 4.0–10.5)
nRBC: 0 % (ref 0.0–0.2)

## 2023-08-22 LAB — BASIC METABOLIC PANEL WITH GFR
Anion gap: 7 (ref 5–15)
BUN: 19 mg/dL (ref 8–23)
CO2: 28 mmol/L (ref 22–32)
Calcium: 9.7 mg/dL (ref 8.9–10.3)
Chloride: 110 mmol/L (ref 98–111)
Creatinine, Ser: 0.98 mg/dL (ref 0.44–1.00)
GFR, Estimated: >60 mL/min (ref >60)
Glucose, Bld: 84 mg/dL (ref 70–99)
Potassium: 3.7 mmol/L (ref 3.5–5.1)
Sodium: 145 mmol/L (ref 135–145)

## 2023-08-22 LAB — SURGICAL PCR SCREEN
MRSA, PCR: NEGATIVE
Staphylococcus aureus: NEGATIVE

## 2023-08-23 ENCOUNTER — Other Ambulatory Visit (HOSPITAL_BASED_OUTPATIENT_CLINIC_OR_DEPARTMENT_OTHER): Payer: Self-pay

## 2023-08-23 ENCOUNTER — Ambulatory Visit (INDEPENDENT_AMBULATORY_CARE_PROVIDER_SITE_OTHER): Payer: 59 | Admitting: Family

## 2023-08-23 VITALS — BP 111/55 | HR 60 | Temp 97.6°F | Ht 63.0 in | Wt 216.4 lb

## 2023-08-23 DIAGNOSIS — Z7985 Long-term (current) use of injectable non-insulin antidiabetic drugs: Secondary | ICD-10-CM

## 2023-08-23 DIAGNOSIS — J45909 Unspecified asthma, uncomplicated: Secondary | ICD-10-CM

## 2023-08-23 DIAGNOSIS — G4733 Obstructive sleep apnea (adult) (pediatric): Secondary | ICD-10-CM

## 2023-08-23 DIAGNOSIS — I10 Essential (primary) hypertension: Secondary | ICD-10-CM

## 2023-08-23 DIAGNOSIS — R87612 Low grade squamous intraepithelial lesion on cytologic smear of cervix (LGSIL): Secondary | ICD-10-CM

## 2023-08-23 DIAGNOSIS — A6000 Herpesviral infection of urogenital system, unspecified: Secondary | ICD-10-CM

## 2023-08-23 DIAGNOSIS — E782 Mixed hyperlipidemia: Secondary | ICD-10-CM | POA: Diagnosis not present

## 2023-08-23 DIAGNOSIS — E119 Type 2 diabetes mellitus without complications: Secondary | ICD-10-CM | POA: Diagnosis not present

## 2023-08-23 DIAGNOSIS — K219 Gastro-esophageal reflux disease without esophagitis: Secondary | ICD-10-CM | POA: Diagnosis not present

## 2023-08-23 LAB — LIPID PANEL
Cholesterol: 154 mg/dL (ref 0–200)
HDL: 57.3 mg/dL (ref >39.00)
LDL Cholesterol: 84 mg/dL (ref 0–99)
NonHDL: 96.56
Total CHOL/HDL Ratio: 3
Triglycerides: 63 mg/dL (ref 0.0–149.0)
VLDL: 12.6 mg/dL (ref 0.0–40.0)

## 2023-08-23 LAB — HEMOGLOBIN A1C: Hgb A1c MFr Bld: 5.7 % (ref 4.6–6.5)

## 2023-08-23 MED ORDER — TIRZEPATIDE 7.5 MG/0.5ML ~~LOC~~ SOAJ
7.5000 mg | SUBCUTANEOUS | 1 refills | Status: DC
  Filled 2023-08-23: qty 6, 84d supply, fill #0

## 2023-08-23 NOTE — Assessment & Plan Note (Signed)
 Reports that she uses her qvar bid and albuterol prn- overall stable.

## 2023-08-23 NOTE — Assessment & Plan Note (Signed)
 Lab Results  Component Value Date   HGBA1C 5.9 05/24/2023   HGBA1C 5.8 01/21/2023   HGBA1C 5.8 10/20/2022   Lab Results  Component Value Date   MICROALBUR 0.7 05/24/2023   LDLCALC 90 05/24/2023   CREATININE 0.98 08/22/2023   Reports tolerating mounjaro 7.5mg  weekly- just has some mild site irritation.  Will update A1C, encouraged pt to schedule her follow up eye exam.

## 2023-08-23 NOTE — Assessment & Plan Note (Signed)
 Wt Readings from Last 3 Encounters:  08/23/23 216 lb 6.4 oz (98.2 kg)  08/22/23 215 lb (97.5 kg)  07/18/23 217 lb (98.4 kg)   Focusing on weight loss, mild, not on CPAP.

## 2023-08-23 NOTE — Assessment & Plan Note (Signed)
 On daily suppressive valtrex, last breakout a few months ago.

## 2023-08-23 NOTE — Assessment & Plan Note (Signed)
 Lab Results  Component Value Date   CHOL 157 05/24/2023   HDL 55.70 05/24/2023   LDLCALC 90 05/24/2023   TRIG 56.0 05/24/2023   CHOLHDL 3 05/24/2023   LDL at goal. Continue pravachol.

## 2023-08-23 NOTE — Assessment & Plan Note (Signed)
 BP Readings from Last 3 Encounters:  08/23/23 (!) 111/55  08/22/23 116/62  05/24/23 (!) 111/55   At goal, continue amlodipine 5mg  and losartan 100mg .

## 2023-08-23 NOTE — Progress Notes (Signed)
 Subjective:     Patient ID: Leah KEEVEN, female    DOB: 10/29/1955, 68 y.o.   MRN: [id number]  Chief Complaint  Patient presents with   Diabetes    Follow up    Hypertension    Follow up    Diabetes  Hypertension    Discussed the use of AI scribe software for clinical note transcription with the patient, who gave verbal consent to proceed.  History of Present Illness  Patient is a 68 yr old female who presents today for follow up.  DM2- tolerating mounjaro 7.5mg .  eye exam is overdue with Dr. Dione Booze.  Abnormal pap- had coloposcopy last summer. Due for 1 year follow up pap in the end of June.  HTN- maintained on losartan and amlodipine.   HSV2- has a break out every few months. Continues daily suppressive therapy.   Asthma- reports asthma is stable with qvar and albuterol prn.   Notes that she is scheduled for a total knee replacement in a few days. Was seen for pre-op evaluation at the hospital.     Health Maintenance Due  Topic Date Due   DTaP/Tdap/Td (2 - Tdap) 07/03/2018   COVID-19 Vaccine (7 - 2024-25 season) 01/16/2023   OPHTHALMOLOGY EXAM  06/10/2023   Cervical Cancer Screening (Pap smear)  09/08/2023    Past Medical History:  Diagnosis Date   Bronchitis    Chronic bronchitis (HCC)    Cystitis    Hematuria    urologice eval, Dr Lindley Magnus   History of chest pain    Hyperbilirubinemia    Hyperlipemia    Hypertelorism    Hypertension    Leukocytosis    Obesity    Osteoarthritis    right knee   Pre-diabetes    Sleep apnea 2012   slight   no CPAP   Vaginal Pap smear, abnormal    Vitamin D deficiency 02/09/2018    Past Surgical History:  Procedure Laterality Date   CERVICAL POLYPECTOMY  07/15/2020   Procedure: POLYPECTOMY with Myosure;  Surgeon: Willodean Rosenthal, MD;  Location: [facility] OR;  Service: Gynecology;;   COLONOSCOPY WITH PROPOFOL N/A 03/27/2015   Procedure: COLONOSCOPY WITH PROPOFOL;  Surgeon: Rachael Fee, MD;  Location: [facility]  ENDOSCOPY;  Service: Endoscopy;  Laterality: N/A;   COLONOSCOPY WITH PROPOFOL N/A 10/02/2020   Procedure: COLONOSCOPY WITH PROPOFOL;  Surgeon: Rachael Fee, MD;  Location: [facility] ENDOSCOPY;  Service: Endoscopy;  Laterality: N/A;   COLPOSCOPY N/A 07/15/2020   Procedure: COLPOSCOPY;  Surgeon: Willodean Rosenthal, MD;  Location: [facility] OR;  Service: Gynecology;  Laterality: N/A;   DILATATION & CURETTAGE/HYSTEROSCOPY WITH MYOSURE N/A 01/05/2017   Procedure: DILATATION & CURETTAGE/HYSTEROSCOPY WITH MYOSURE;  Surgeon: Geryl Rankins, MD;  Location: [facility] ORS;  Service: Gynecology;  Laterality: N/A;  PostMenopausal Bleeding   HYSTEROSCOPY WITH D & C N/A 05/22/2019   Procedure: DILATATION AND CURETTAGE /HYSTEROSCOPY, POLYPECTOMY WITH MYOSURE;  Surgeon: Willodean Rosenthal, MD;  Location: [facility] ORS;  Service: Gynecology;  Laterality: N/A;   HYSTEROSCOPY WITH D & C N/A 07/15/2020   Procedure: DILATATION AND CURETTAGE /HYSTEROSCOPY;  Surgeon: Willodean Rosenthal, MD;  Location: [facility] OR;  Service: Gynecology;  Laterality: N/A;   KNEE ARTHROSCOPY  04/2007   left knee   LAPAROSCOPIC GASTRIC SLEEVE RESECTION     TOTAL KNEE ARTHROPLASTY Left 10/2008   Dr Irine Seal   UPPER GI ENDOSCOPY N/A 10/14/2020   Procedure: UPPER GI ENDOSCOPY;  Surgeon: Gaynelle Adu, MD;  Location: [facility] ORS;  Service: General;  Laterality:  N/A;    Family History  Problem Relation Age of Onset   Heart failure Mother    Arthritis Mother    Hypertension Mother    Stroke Mother    Diabetes Mellitus II Mother    Heart attack Father 51   Diabetes Other    Arthritis Other    Stroke Other    Coronary artery disease Other    Hypertension Sister        x 2   CVA Sister    CAD Sister 61   Diabetes Mellitus II Sister    Cirrhosis Brother    Alcohol abuse Brother    Colon cancer Neg Hx     Social History   Socioeconomic History   Marital status: Single    Spouse name: Not on file   Number of children: 2   Years of education: Not  on file   Highest education level: Not on file  Occupational History   Occupation: disabled  Tobacco Use   Smoking status: Never   Smokeless tobacco: Never  Vaping Use   Vaping status: Never Used  Substance and Sexual Activity   Alcohol use: No   Drug use: No   Sexual activity: Not Currently  Other Topics Concern   Not on file  Social History Narrative   Regular exercise:  No   Lives alone   Has 2 grown sons, both local.  1 grandson   On disability due to her knee pain   Completed 12th grade   Enjoys television   No pets.    Social Drivers of Corporate investment banker Strain: Low Risk  (05/19/2023)   Overall Financial Resource Strain (CARDIA)    Difficulty of Paying Living Expenses: Not hard at all  Food Insecurity: No Food Insecurity (05/19/2023)   Hunger Vital Sign    Worried About Running Out of Food in the Last Year: Never true    Ran Out of Food in the Last Year: Never true  Transportation Needs: No Transportation Needs (05/19/2023)   PRAPARE - Administrator, Civil Service (Medical): No    Lack of Transportation (Non-Medical): No  Physical Activity: Sufficiently Active (05/19/2023)   Exercise Vital Sign    Days of Exercise per Week: 5 days    Minutes of Exercise per Session: 50 min  Stress: Stress Concern Present (05/19/2023)   [employer] of Occupational Health - Occupational Stress Questionnaire    Feeling of Stress : To some extent  Social Connections: Moderately Integrated (05/19/2023)   Social Connection and Isolation Panel [NHANES]    Frequency of Communication with Friends and Family: More than three times a week    Frequency of Social Gatherings with Friends and Family: More than three times a week    Attends Religious Services: More than 4 times per year    Active Member of [employer] or Organizations: Yes    Attends Banker Meetings: Never    Marital Status: Never married  Intimate Partner Violence: Not At Risk (11/25/2022)    Humiliation, Afraid, Rape, and Kick questionnaire    Fear of Current or Ex-Partner: No    Emotionally Abused: No    Physically Abused: No    Sexually Abused: No    Outpatient Medications Prior to Visit  Medication Sig Dispense Refill   albuterol (VENTOLIN HFA) 108 (90 Base) MCG/ACT inhaler USE 2 INHALATIONS BY MOUTH EVERY 6 HOURS AS NEEDED FOR WHEEZING  OR SHORTNESS OF BREATH 34 g 1  amLODipine (NORVASC) 5 MG tablet TAKE 1 TABLET BY MOUTH DAILY 100 tablet 2   beclomethasone (QVAR REDIHALER) 80 MCG/ACT inhaler USE 1 INHALATION BY MOUTH INTO  THE LUNGS TWICE DAILY 21.2 g 1   Calcium-Vitamin D-Vitamin K (VIACTIV CALCIUM PLUS D) 650-12.5-40 MG-MCG-MCG CHEW Chew 1 tablet by mouth in the morning, at noon, and at bedtime.     FIBER GUMMIES PO Take 1-3 tablets by mouth daily as needed (regularity.). Metamucil Fiber Gummies for Adults     losartan (COZAAR) 100 MG tablet TAKE 1 TABLET BY MOUTH DAILY 100 tablet 1   Multiple Vitamins-Minerals (BARIATRIC MULTIVITAMINS PO) Take 1 capsule by mouth daily in the afternoon.     pravastatin (PRAVACHOL) 80 MG tablet TAKE 1 TABLET BY MOUTH DAILY (Patient taking differently: Take 80 mg by mouth every evening.) 100 tablet 2   valACYclovir (VALTREX) 1000 MG tablet TAKE 1 TABLET BY MOUTH DAILY 100 tablet 2   tirzepatide (MOUNJARO) 7.5 MG/0.5ML Pen Inject 7.5 mg into the skin once a week. (Patient taking differently: Inject 7.5 mg into the skin every Wednesday.) 6 mL 0   No facility-administered medications prior to visit.    Allergies  Allergen Reactions   Sulfamethoxazole-Trimethoprim [Bactrim] Itching   Voltaren [Diclofenac] Diarrhea   Codeine Nausea And Vomiting and Anxiety    ROS See HPI    Objective:    Physical Exam Constitutional:      General: She is not in acute distress.    Appearance: Normal appearance. She is well-developed.  HENT:     Head: Normocephalic and atraumatic.     Right Ear: External ear normal.     Left Ear: External ear  normal.  Eyes:     General: No scleral icterus. Neck:     Thyroid: No thyromegaly.  Cardiovascular:     Rate and Rhythm: Normal rate and regular rhythm.     Heart sounds: Normal heart sounds. No murmur heard. Pulmonary:     Effort: Pulmonary effort is normal. No respiratory distress.     Breath sounds: Normal breath sounds. No wheezing.  Musculoskeletal:     Cervical back: Neck supple.  Skin:    General: Skin is warm and dry.  Neurological:     Mental Status: She is alert and oriented to person, place, and time.  Psychiatric:        Mood and Affect: Mood normal.        Behavior: Behavior normal.        Thought Content: Thought content normal.        Judgment: Judgment normal.      BP (!) 111/55 (BP Location: Right Arm, Patient Position: Sitting, Cuff Size: Large)   Pulse 60   Temp 97.6 F (36.4 C) (Oral)   Ht 5\' 3"  (1.6 m)   Wt 216 lb 6.4 oz (98.2 kg)   LMP 11/10/2011   SpO2 100%   BMI 38.33 kg/m  Wt Readings from Last 3 Encounters:  08/23/23 216 lb 6.4 oz (98.2 kg)  08/22/23 215 lb (97.5 kg)  07/18/23 217 lb (98.4 kg)       Assessment & Plan:   Problem List Items Addressed This Visit       Unprioritized   Uncomplicated asthma   Reports that she uses her qvar bid and albuterol prn- overall stable.       Type 2 diabetes mellitus without complication, without long-term current use of insulin (HCC) - Primary   Lab Results  Component Value Date  HGBA1C 5.9 05/24/2023   HGBA1C 5.8 01/21/2023   HGBA1C 5.8 10/20/2022   Lab Results  Component Value Date   MICROALBUR 0.7 05/24/2023   LDLCALC 90 05/24/2023   CREATININE 0.98 08/22/2023   Reports tolerating mounjaro 7.5mg  weekly- just has some mild site irritation.  Will update A1C, encouraged pt to schedule her follow up eye exam.        Relevant Medications   tirzepatide (MOUNJARO) 7.5 MG/0.5ML Pen   Other Relevant Orders   HgB A1c   OSA (obstructive sleep apnea)   Wt Readings from Last 3 Encounters:   08/23/23 216 lb 6.4 oz (98.2 kg)  08/22/23 215 lb (97.5 kg)  07/18/23 217 lb (98.4 kg)   Focusing on weight loss, mild, not on CPAP.      Mixed hyperlipidemia   Lab Results  Component Value Date   CHOL 157 05/24/2023   HDL 55.70 05/24/2023   LDLCALC 90 05/24/2023   TRIG 56.0 05/24/2023   CHOLHDL 3 05/24/2023   LDL at goal. Continue pravachol.       Relevant Orders   Lipid panel   Low grade squamous intraepithelial lesion (LGSIL) on cervical Pap smear   S/p coloposcopy last year- advised pt to schedule GYN follow up in the end of June.      Genital herpes   On daily suppressive valtrex, last breakout a few months ago.       Gastroesophageal reflux disease   Stable without medication.       Essential hypertension   BP Readings from Last 3 Encounters:  08/23/23 (!) 111/55  08/22/23 116/62  05/24/23 (!) 111/55   At goal, continue amlodipine 5mg  and losartan 100mg .        I am having Michel Harrow. Stripling maintain her amLODipine, valACYclovir, pravastatin, losartan, albuterol, Qvar RediHaler, Multiple Vitamins-Minerals (BARIATRIC MULTIVITAMINS PO), Viactiv Calcium Plus D, FIBER GUMMIES PO, and tirzepatide.  Meds ordered this encounter  Medications   tirzepatide (MOUNJARO) 7.5 MG/0.5ML Pen    Sig: Inject 7.5 mg into the skin once a week.    Dispense:  6 mL    Refill:  1    Supervising Provider:   Danise Edge A [[id number]]

## 2023-08-23 NOTE — Patient Instructions (Addendum)
 Please call to schedule a follow up pap with Dr. Erin Fulling in June.  Schedule a follow up visit with Dr. Dione Booze for your annual eye exam.  Please complete lab work prior to leaving

## 2023-08-23 NOTE — Assessment & Plan Note (Signed)
 S/p coloposcopy last year- advised pt to schedule GYN follow up in the end of June.

## 2023-08-23 NOTE — Assessment & Plan Note (Signed)
 Stable without medication

## 2023-08-24 ENCOUNTER — Encounter: Payer: Self-pay | Admitting: Family

## 2023-08-25 ENCOUNTER — Telehealth: Payer: Self-pay | Admitting: *Deleted

## 2023-08-25 NOTE — Telephone Encounter (Signed)
 Ortho bundle pre-op call completed.

## 2023-08-25 NOTE — H&P (Signed)
 TOTAL KNEE ADMISSION H&P  Patient is being admitted for right total knee arthroplasty.  Subjective:  Chief Complaint:right knee pain.  HPI: Leah Hampton, 68 y.o. female, has a history of pain and functional disability in the right knee due to arthritis and has failed non-surgical conservative treatments for greater than 12 weeks to includeNSAID's and/or analgesics, corticosteriod injections, viscosupplementation injections, flexibility and strengthening excercises, use of assistive devices, weight reduction as appropriate, and activity modification.  Onset of symptoms was gradual, starting many years ago with gradually worsening course since that time. The patient noted prior procedures on the knee to include  arthroscopy on the right knee(s).  Patient currently rates pain in the right knee(s) at 10 out of 10 with activity. Patient has night pain, worsening of pain with activity and weight bearing, pain that interferes with activities of daily living, pain with passive range of motion, crepitus, and joint swelling.  Patient has evidence of subchondral cysts, subchondral sclerosis, periarticular osteophytes, and joint space narrowing by imaging studies. There is no active infection.  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   Uncomplicated asthma 11/04/2020   Gastroesophageal reflux disease 11/04/2020   Primary osteoarthritis of both hips 09/30/2020   History of total left knee replacement 07/29/2020   Endometrial polyp 07/15/2020   Low grade squamous intraepithelial lesion (LGSIL) on cervical Pap smear 07/07/2020   Upper abdominal pain 08/20/2019   Right hip pain 05/28/2019   Post-menopausal bleeding 05/16/2019   Greater trochanteric pain syndrome of left lower extremity 03/20/2019   Type 2 diabetes mellitus without complication, without long-term current use of insulin  (HCC) 05/18/2018   Vitamin D deficiency 02/09/2018   Onychomycosis 09/19/2017   Tinea pedis of both feet 09/19/2017   Obstructive chronic bronchitis with exacerbation (HCC) 05/23/2017   Mixed hyperlipidemia 05/01/2014   Genital herpes 05/23/2012   Stress incontinence 03/04/2012   Unilateral primary osteoarthritis, right knee 10/12/2011   CTS (carpal tunnel syndrome) 07/13/2011   OSA (obstructive sleep apnea) 12/10/2010   Severe obesity (BMI >= 40) (HCC) 07/31/2008   Essential hypertension 07/03/2008   Past Medical History:  Diagnosis Date   Bronchitis    Chronic bronchitis (HCC)    Cystitis    Hematuria    urologice eval, Dr Lindley Magnus   History of chest pain    Hyperbilirubinemia    Hyperlipemia    Hypertelorism    Hypertension    Leukocytosis    Obesity    Osteoarthritis    right knee   Pre-diabetes    Sleep apnea 2012   slight   no CPAP   Vaginal Pap smear, abnormal    Vitamin D deficiency 02/09/2018    Past Surgical History:  Procedure Laterality Date   CERVICAL POLYPECTOMY  07/15/2020   Procedure: POLYPECTOMY with Myosure;  Surgeon: Willodean Rosenthal, MD;  Location: [facility] OR;  Service: Gynecology;;   COLONOSCOPY WITH PROPOFOL N/A 03/27/2015   Procedure: COLONOSCOPY WITH PROPOFOL;  Surgeon: Rachael Fee, MD;  Location: [facility];  Service: Endoscopy;  Laterality: N/A;   COLONOSCOPY WITH PROPOFOL N/A 10/02/2020   Procedure: COLONOSCOPY WITH PROPOFOL;  Surgeon: Rachael Fee, MD;  Location: [facility] ENDOSCOPY;  Service: Endoscopy;  Laterality: N/A;   COLPOSCOPY N/A 07/15/2020   Procedure: COLPOSCOPY;  Surgeon: Willodean Rosenthal, MD;  Location: [facility] OR;  Service: Gynecology;  Laterality: N/A;   DILATATION & CURETTAGE/HYSTEROSCOPY WITH MYOSURE N/A  01/05/2017   Procedure: DILATATION & CURETTAGE/HYSTEROSCOPY WITH MYOSURE;  Surgeon: Geryl Rankins, MD;  Location: [facility] ORS;  Service: Gynecology;  Laterality: N/A;  PostMenopausal Bleeding   HYSTEROSCOPY WITH D & C N/A  05/22/2019   Procedure: DILATATION AND CURETTAGE /HYSTEROSCOPY, POLYPECTOMY WITH MYOSURE;  Surgeon: Willodean Rosenthal, MD;  Location: [facility] OR[facility];  Service: Gynecology;  Laterality: N/A;   HYSTEROSCOPY WITH D & C N/A 07/15/2020   Procedure: DILATATION AND CURETTAGE /HYSTEROSCOPY;  Surgeon: Willodean Rosenthal, MD;  Location: [facility] OR;  Service: Gynecology;  Laterality: N/A;   KNEE ARTHROSCOPY  04/2007   left knee   LAPAROSCOPIC GASTRIC SLEEVE RESECTION     TOTAL KNEE ARTHROPLASTY Left 10/2008   Dr Irine Seal   UPPER GI ENDOSCOPY N/A 10/14/2020   Procedure: UPPER GI ENDOSCOPY;  Surgeon: Gaynelle Adu, MD;  Location: [facility] OR[facility];  Service: General;  Laterality: N/A;    No current facility-administered medications for this encounter.   Current Outpatient Medications  Medication Sig Dispense Refill Last Dose/Taking   albuterol (VENTOLIN HFA) 108 (90 Base) MCG/ACT inhaler USE 2 INHALATIONS BY MOUTH EVERY 6 HOURS AS NEEDED FOR WHEEZING  OR SHORTNESS OF BREATH 34 g 1 Taking   amLODipine (NORVASC) 5 MG tablet TAKE 1 TABLET BY MOUTH DAILY 100 tablet 2 Taking   beclomethasone (QVAR REDIHALER) 80 MCG/ACT inhaler USE 1 INHALATION BY MOUTH INTO  THE LUNGS TWICE DAILY 21.2 g 1 Taking   Calcium-Vitamin D-Vitamin K (VIACTIV CALCIUM PLUS D) 650-12.5-40 MG-MCG-MCG CHEW Chew 1 tablet by mouth in the morning, at noon, and at bedtime.   Taking   FIBER GUMMIES PO Take 1-3 tablets by mouth daily as needed (regularity.). Metamucil Fiber Gummies for Adults   Taking As Needed   losartan (COZAAR) 100 MG tablet TAKE 1 TABLET BY MOUTH DAILY 100 tablet 1 Taking   Multiple Vitamins-Minerals (BARIATRIC MULTIVITAMINS PO) Take 1 capsule by mouth daily in the afternoon.   Taking   pravastatin (PRAVACHOL) 80 MG tablet TAKE 1 TABLET BY MOUTH DAILY (Patient taking differently: Take 80 mg by mouth every evening.) 100 tablet 2 Taking Differently   valACYclovir (VALTREX) 1000 MG tablet TAKE 1 TABLET BY MOUTH DAILY 100 tablet 2 Taking    tirzepatide (MOUNJARO) 7.5 MG/0.5ML Pen Inject 7.5 mg into the skin once a week. 6 mL 1    Allergies  Allergen Reactions   Sulfamethoxazole-Trimethoprim [Bactrim] Itching   Voltaren [Diclofenac] Diarrhea   Codeine Nausea And Vomiting and Anxiety    Social History   Tobacco Use   Smoking status: Never   Smokeless tobacco: Never  Substance Use Topics   Alcohol use: No    Family History  Problem Relation Age of Onset   Heart failure Mother    Arthritis Mother    Hypertension Mother    Stroke Mother    Diabetes Mellitus II Mother    Heart attack Father 52   Diabetes Other    Arthritis Other    Stroke Other    Coronary artery disease Other    Hypertension Sister        x 2   CVA Sister    CAD Sister 20   Diabetes Mellitus II Sister    Cirrhosis Brother    Alcohol abuse Brother    Colon cancer Neg Hx      Review of Systems  Objective:  Physical Exam Vitals reviewed.  Constitutional:      Appearance: Normal appearance. She is obese.  HENT:     Head: Normocephalic and  atraumatic.  Eyes:     Extraocular Movements: Extraocular movements intact.     Pupils: Pupils are equal, round, and reactive to light.  Cardiovascular:     Rate and Rhythm: Normal rate.  Pulmonary:     Effort: Pulmonary effort is normal.     Breath sounds: Normal breath sounds.  Abdominal:     Palpations: Abdomen is soft.  Musculoskeletal:     Cervical back: Normal range of motion and neck supple.     Right knee: Effusion, bony tenderness and crepitus present. Decreased range of motion. Tenderness present over the medial joint line and lateral joint line. Abnormal alignment and abnormal meniscus.  Neurological:     Mental Status: She is alert and oriented to person, place, and time.  Psychiatric:        Behavior: Behavior normal.     Vital signs in last 24 hours:    Labs:   Estimated body mass index is 38.33 kg/m as calculated from the following:   Height as of 08/23/23: 5\' 3"  (1.6  m).   Weight as of 08/23/23: 98.2 kg.   Imaging Review Plain radiographs demonstrate severe degenerative joint disease of the right knee(s). The overall alignment ismild valgus. The bone quality appears to be good for age and reported activity level.      Assessment/Plan:  End stage arthritis, right knee   The patient history, physical examination, clinical judgment of the provider and imaging studies are consistent with end stage degenerative joint disease of the right knee(s) and total knee arthroplasty is deemed medically necessary. The treatment options including medical management, injection therapy arthroscopy and arthroplasty were discussed at length. The risks and benefits of total knee arthroplasty were presented and reviewed. The risks due to aseptic loosening, infection, stiffness, patella tracking problems, thromboembolic complications and other imponderables were discussed. The patient acknowledged the explanation, agreed to proceed with the plan and consent was signed. Patient is being admitted for inpatient treatment for surgery, pain control, PT, OT, prophylactic antibiotics, VTE prophylaxis, progressive ambulation and ADL's and discharge planning. The patient is planning to be discharged home with home health services

## 2023-08-26 ENCOUNTER — Ambulatory Visit (HOSPITAL_BASED_OUTPATIENT_CLINIC_OR_DEPARTMENT_OTHER): Admitting: Anesthesiology

## 2023-08-26 ENCOUNTER — Other Ambulatory Visit: Payer: Self-pay | Admitting: *Deleted

## 2023-08-26 ENCOUNTER — Encounter (HOSPITAL_COMMUNITY)
Admission: RE | Disposition: A | Discharge status: Home Health | Payer: Self-pay | Source: Home / Self Care | Attending: Orthopaedic Surgery

## 2023-08-26 ENCOUNTER — Ambulatory Visit (HOSPITAL_COMMUNITY): Admitting: Anesthesiology

## 2023-08-26 ENCOUNTER — Encounter (HOSPITAL_COMMUNITY): Payer: Self-pay | Admitting: Orthopaedic Surgery

## 2023-08-26 ENCOUNTER — Observation Stay (HOSPITAL_COMMUNITY)

## 2023-08-26 ENCOUNTER — Other Ambulatory Visit: Payer: Self-pay

## 2023-08-26 ENCOUNTER — Observation Stay (HOSPITAL_COMMUNITY)
Admission: RE | Admit: 2023-08-26 | Discharge: 2023-08-29 | Disposition: A | Discharge status: Home Health | Attending: Orthopaedic Surgery | Admitting: Orthopaedic Surgery

## 2023-08-26 DIAGNOSIS — Z96652 Presence of left artificial knee joint: Secondary | ICD-10-CM | POA: Insufficient documentation

## 2023-08-26 DIAGNOSIS — S82401A Unspecified fracture of shaft of right fibula, initial encounter for closed fracture: Secondary | ICD-10-CM | POA: Diagnosis not present

## 2023-08-26 DIAGNOSIS — Z96651 Presence of right artificial knee joint: Secondary | ICD-10-CM

## 2023-08-26 DIAGNOSIS — I10 Essential (primary) hypertension: Secondary | ICD-10-CM | POA: Insufficient documentation

## 2023-08-26 DIAGNOSIS — M1711 Unilateral primary osteoarthritis, right knee: Principal | ICD-10-CM | POA: Diagnosis present

## 2023-08-26 DIAGNOSIS — E119 Type 2 diabetes mellitus without complications: Secondary | ICD-10-CM | POA: Diagnosis not present

## 2023-08-26 DIAGNOSIS — G4733 Obstructive sleep apnea (adult) (pediatric): Secondary | ICD-10-CM | POA: Diagnosis not present

## 2023-08-26 DIAGNOSIS — J449 Chronic obstructive pulmonary disease, unspecified: Secondary | ICD-10-CM

## 2023-08-26 DIAGNOSIS — Z79899 Other long term (current) drug therapy: Secondary | ICD-10-CM | POA: Insufficient documentation

## 2023-08-26 DIAGNOSIS — Z471 Aftercare following joint replacement surgery: Secondary | ICD-10-CM | POA: Diagnosis not present

## 2023-08-26 DIAGNOSIS — R609 Edema, unspecified: Secondary | ICD-10-CM | POA: Diagnosis not present

## 2023-08-26 HISTORY — PX: TOTAL KNEE ARTHROPLASTY: SHX125

## 2023-08-26 LAB — GLUCOSE, CAPILLARY
Glucose-Capillary: 84 mg/dL (ref 70–99)
Glucose-Capillary: 99 mg/dL (ref 70–99)

## 2023-08-26 SURGERY — ARTHROPLASTY, KNEE, TOTAL
Anesthesia: Spinal | Site: Knee | Laterality: Right

## 2023-08-26 MED ORDER — METHOCARBAMOL 500 MG PO TABS
500.0000 mg | ORAL_TABLET | Freq: Four times a day (QID) | ORAL | Status: DC | PRN
  Administered 2023-08-27 – 2023-08-29 (×4): 500 mg via ORAL
  Filled 2023-08-26 (×6): qty 1

## 2023-08-26 MED ORDER — PANTOPRAZOLE SODIUM 40 MG PO TBEC
40.0000 mg | DELAYED_RELEASE_TABLET | Freq: Every day | ORAL | Status: DC
  Administered 2023-08-26 – 2023-08-29 (×4): 40 mg via ORAL
  Filled 2023-08-26 (×4): qty 1

## 2023-08-26 MED ORDER — ONDANSETRON HCL 4 MG/2ML IJ SOLN
INTRAMUSCULAR | Status: DC | PRN
Start: 2023-08-26 — End: 2023-08-26
  Administered 2023-08-26: 4 mg via INTRAVENOUS

## 2023-08-26 MED ORDER — PROPOFOL 500 MG/50ML IV EMUL
INTRAVENOUS | Status: DC | PRN
  Administered 2023-08-26: 50 ug/kg/min via INTRAVENOUS

## 2023-08-26 MED ORDER — PROPOFOL 10 MG/ML IV BOLUS
INTRAVENOUS | Status: AC
  Filled 2023-08-26: qty 20

## 2023-08-26 MED ORDER — POVIDONE-IODINE 10 % EX SWAB: 2.0000 | Freq: Once | CUTANEOUS | Status: DC

## 2023-08-26 MED ORDER — METHOCARBAMOL 1000 MG/10ML IJ SOLN: 500.0000 mg | Freq: Four times a day (QID) | INTRAMUSCULAR | Status: DC | PRN

## 2023-08-26 MED ORDER — ONDANSETRON HCL 4 MG PO TABS
4.0000 mg | ORAL_TABLET | Freq: Four times a day (QID) | ORAL | Status: DC | PRN
  Administered 2023-08-26: 4 mg via ORAL
  Filled 2023-08-26: qty 1

## 2023-08-26 MED ORDER — TRANEXAMIC ACID-NACL 1000-0.7 MG/100ML-% IV SOLN
1000.0000 mg | INTRAVENOUS | Status: AC
  Administered 2023-08-26: 1000 mg via INTRAVENOUS
  Filled 2023-08-26: qty 100

## 2023-08-26 MED ORDER — LACTATED RINGERS IV SOLN: INTRAVENOUS | Status: DC

## 2023-08-26 MED ORDER — DROPERIDOL 2.5 MG/ML IJ SOLN: 0.6250 mg | Freq: Once | INTRAMUSCULAR | Status: DC | PRN

## 2023-08-26 MED ORDER — DEXAMETHASONE SODIUM PHOSPHATE 10 MG/ML IJ SOLN
INTRAMUSCULAR | Status: DC | PRN
  Administered 2023-08-26: 10 mg via INTRAVENOUS

## 2023-08-26 MED ORDER — DIPHENHYDRAMINE HCL 12.5 MG/5ML PO ELIX: 12.5000 mg | ORAL_SOLUTION | ORAL | Status: DC | PRN

## 2023-08-26 MED ORDER — BUDESONIDE 0.25 MG/2ML IN SUSP
0.2500 mg | Freq: Two times a day (BID) | RESPIRATORY_TRACT | Status: DC
  Administered 2023-08-26 – 2023-08-29 (×6): 0.25 mg via RESPIRATORY_TRACT
  Filled 2023-08-26 (×6): qty 2

## 2023-08-26 MED ORDER — OXYCODONE HCL 5 MG PO TABS
5.0000 mg | ORAL_TABLET | ORAL | Status: DC | PRN
  Administered 2023-08-26 – 2023-08-29 (×5): 10 mg via ORAL
  Filled 2023-08-26 (×6): qty 2

## 2023-08-26 MED ORDER — FENTANYL CITRATE PF 50 MCG/ML IJ SOSY
100.0000 ug | PREFILLED_SYRINGE | INTRAMUSCULAR | Status: AC
  Administered 2023-08-26: 50 ug via INTRAVENOUS
  Filled 2023-08-26: qty 2

## 2023-08-26 MED ORDER — METOCLOPRAMIDE HCL 5 MG/ML IJ SOLN
5.0000 mg | Freq: Three times a day (TID) | INTRAMUSCULAR | Status: DC | PRN
  Administered 2023-08-26: 10 mg via INTRAVENOUS
  Filled 2023-08-26: qty 2

## 2023-08-26 MED ORDER — CHLORHEXIDINE GLUCONATE 0.12 % MT SOLN
15.0000 mL | Freq: Once | OROMUCOSAL | Status: AC
  Administered 2023-08-26: 15 mL via OROMUCOSAL

## 2023-08-26 MED ORDER — PROPOFOL 10 MG/ML IV BOLUS
INTRAVENOUS | Status: DC | PRN
  Administered 2023-08-26 (×3): 20 mg via INTRAVENOUS
  Administered 2023-08-26: 30 mg via INTRAVENOUS
  Administered 2023-08-26: 20 mg via INTRAVENOUS

## 2023-08-26 MED ORDER — CEFAZOLIN SODIUM-DEXTROSE 2-4 GM/100ML-% IV SOLN
2.0000 g | INTRAVENOUS | Status: AC
Start: 2023-08-26 — End: 2023-08-26
  Administered 2023-08-26: 2 g via INTRAVENOUS
  Filled 2023-08-26: qty 100

## 2023-08-26 MED ORDER — DEXAMETHASONE SODIUM PHOSPHATE 10 MG/ML IJ SOLN
INTRAMUSCULAR | Status: AC
  Filled 2023-08-26: qty 1

## 2023-08-26 MED ORDER — ONDANSETRON HCL 4 MG/2ML IJ SOLN
4.0000 mg | Freq: Four times a day (QID) | INTRAMUSCULAR | Status: DC | PRN
  Administered 2023-08-27: 4 mg via INTRAVENOUS
  Filled 2023-08-26: qty 2

## 2023-08-26 MED ORDER — HYDROMORPHONE HCL 1 MG/ML IJ SOLN: 0.5000 mg | INTRAMUSCULAR | Status: DC | PRN

## 2023-08-26 MED ORDER — PROPOFOL 500 MG/50ML IV EMUL
INTRAVENOUS | Status: AC
  Filled 2023-08-26: qty 50

## 2023-08-26 MED ORDER — PHENYLEPHRINE HCL-NACL 20-0.9 MG/250ML-% IV SOLN
INTRAVENOUS | Status: DC | PRN
  Administered 2023-08-26: 25 ug/min via INTRAVENOUS

## 2023-08-26 MED ORDER — PROPOFOL 1000 MG/100ML IV EMUL
INTRAVENOUS | Status: AC
  Filled 2023-08-26: qty 100

## 2023-08-26 MED ORDER — BUPIVACAINE-EPINEPHRINE 0.25% -1:200000 IJ SOLN
INTRAMUSCULAR | Status: DC | PRN
  Administered 2023-08-26: 30 mL

## 2023-08-26 MED ORDER — ALUM & MAG HYDROXIDE-SIMETH 200-200-20 MG/5ML PO SUSP: 30.0000 mL | ORAL | Status: DC | PRN

## 2023-08-26 MED ORDER — HYDROMORPHONE HCL 1 MG/ML IJ SOLN
0.2500 mg | INTRAMUSCULAR | Status: DC | PRN
  Administered 2023-08-26 (×4): 0.5 mg via INTRAVENOUS

## 2023-08-26 MED ORDER — ONDANSETRON HCL 4 MG/2ML IJ SOLN
INTRAMUSCULAR | Status: AC
  Filled 2023-08-26: qty 2

## 2023-08-26 MED ORDER — MIDAZOLAM HCL 2 MG/2ML IJ SOLN
2.0000 mg | INTRAMUSCULAR | Status: AC
  Administered 2023-08-26: 2 mg via INTRAVENOUS
  Filled 2023-08-26: qty 2

## 2023-08-26 MED ORDER — PHENOL 1.4 % MT LIQD: 1.0000 | OROMUCOSAL | Status: DC | PRN

## 2023-08-26 MED ORDER — ACETAMINOPHEN 500 MG PO TABS
1000.0000 mg | ORAL_TABLET | Freq: Once | ORAL | Status: AC
  Administered 2023-08-26: 1000 mg via ORAL
  Filled 2023-08-26: qty 2

## 2023-08-26 MED ORDER — BUPIVACAINE-EPINEPHRINE (PF) 0.25% -1:200000 IJ SOLN
INTRAMUSCULAR | Status: AC
  Filled 2023-08-26: qty 30

## 2023-08-26 MED ORDER — HYDROMORPHONE HCL 1 MG/ML IJ SOLN
INTRAMUSCULAR | Status: AC
  Filled 2023-08-26: qty 1

## 2023-08-26 MED ORDER — OXYCODONE HCL 5 MG PO TABS
10.0000 mg | ORAL_TABLET | ORAL | Status: DC | PRN
  Administered 2023-08-27 (×3): 10 mg via ORAL
  Administered 2023-08-28 – 2023-08-29 (×5): 15 mg via ORAL
  Filled 2023-08-26: qty 3
  Filled 2023-08-26: qty 2
  Filled 2023-08-26 (×3): qty 3
  Filled 2023-08-26: qty 2
  Filled 2023-08-26: qty 3

## 2023-08-26 MED ORDER — RIVAROXABAN 10 MG PO TABS
10.0000 mg | ORAL_TABLET | Freq: Every day | ORAL | Status: DC
  Administered 2023-08-27 – 2023-08-29 (×3): 10 mg via ORAL
  Filled 2023-08-26 (×3): qty 1

## 2023-08-26 MED ORDER — ALBUTEROL SULFATE (2.5 MG/3ML) 0.083% IN NEBU: 2.5000 mg | INHALATION_SOLUTION | Freq: Four times a day (QID) | RESPIRATORY_TRACT | Status: DC | PRN

## 2023-08-26 MED ORDER — SODIUM CHLORIDE 0.9 % IV SOLN: INTRAVENOUS | Status: AC

## 2023-08-26 MED ORDER — 0.9 % SODIUM CHLORIDE (POUR BTL) OPTIME
TOPICAL | Status: DC | PRN
  Administered 2023-08-26: 1000 mL

## 2023-08-26 MED ORDER — DOCUSATE SODIUM 100 MG PO CAPS
100.0000 mg | ORAL_CAPSULE | Freq: Two times a day (BID) | ORAL | Status: DC
  Administered 2023-08-26 – 2023-08-29 (×6): 100 mg via ORAL
  Filled 2023-08-26 (×7): qty 1

## 2023-08-26 MED ORDER — BUPIVACAINE IN DEXTROSE 0.75-8.25 % IT SOLN
INTRATHECAL | Status: DC | PRN
  Administered 2023-08-26: 1.6 mL via INTRATHECAL

## 2023-08-26 MED ORDER — METOCLOPRAMIDE HCL 5 MG PO TABS: 5.0000 mg | ORAL_TABLET | Freq: Three times a day (TID) | ORAL | Status: DC | PRN

## 2023-08-26 MED ORDER — LOSARTAN POTASSIUM 50 MG PO TABS
100.0000 mg | ORAL_TABLET | Freq: Every day | ORAL | Status: DC
  Administered 2023-08-26 – 2023-08-29 (×4): 100 mg via ORAL
  Filled 2023-08-26 (×5): qty 2

## 2023-08-26 MED ORDER — ACETAMINOPHEN 325 MG PO TABS
325.0000 mg | ORAL_TABLET | Freq: Four times a day (QID) | ORAL | Status: DC | PRN
  Administered 2023-08-27 – 2023-08-28 (×2): 650 mg via ORAL
  Filled 2023-08-26 (×2): qty 2

## 2023-08-26 MED ORDER — AMLODIPINE BESYLATE 5 MG PO TABS
5.0000 mg | ORAL_TABLET | Freq: Every day | ORAL | Status: DC
  Administered 2023-08-27 – 2023-08-29 (×3): 5 mg via ORAL
  Filled 2023-08-26 (×4): qty 1

## 2023-08-26 MED ORDER — SODIUM CHLORIDE 0.9 % IR SOLN
Status: DC | PRN
  Administered 2023-08-26: 1000 mL

## 2023-08-26 MED ORDER — MENTHOL 3 MG MT LOZG: 1.0000 | LOZENGE | OROMUCOSAL | Status: DC | PRN

## 2023-08-26 MED ORDER — POLYETHYLENE GLYCOL 3350 17 G PO PACK: 17.0000 g | PACK | Freq: Every day | ORAL | Status: DC | PRN

## 2023-08-26 MED ORDER — ORAL CARE MOUTH RINSE: 15.0000 mL | Freq: Once | OROMUCOSAL | Status: AC

## 2023-08-26 MED ORDER — CEFAZOLIN SODIUM-DEXTROSE 2-4 GM/100ML-% IV SOLN
2.0000 g | Freq: Four times a day (QID) | INTRAVENOUS | Status: AC
  Administered 2023-08-26 – 2023-08-27 (×2): 2 g via INTRAVENOUS
  Filled 2023-08-26 (×2): qty 100

## 2023-08-26 SURGICAL SUPPLY — 57 items
BAG COUNTER SPONGE SURGICOUNT (BAG) IMPLANT
BAG ZIPLOCK 12X15 (MISCELLANEOUS) ×2 IMPLANT
BENZOIN TINCTURE PRP APPL 2/3 (GAUZE/BANDAGES/DRESSINGS) IMPLANT
BLADE SAG 18X100X1.27 (BLADE) ×2 IMPLANT
BLADE SAW SGTL 13X75X1.27 (BLADE) IMPLANT
BLADE SURG SZ10 CARB STEEL (BLADE) IMPLANT
BNDG ELASTIC 6INX 5YD STR LF (GAUZE/BANDAGES/DRESSINGS) ×4 IMPLANT
BOWL SMART MIX CTS (DISPOSABLE) IMPLANT
CEMENT BONE R 1X40 (Cement) IMPLANT
COOLER ICEMAN CLASSIC (MISCELLANEOUS) ×2 IMPLANT
COVER SURGICAL LIGHT HANDLE (MISCELLANEOUS) ×2 IMPLANT
CUFF TRNQT CYL 34X4.125X (TOURNIQUET CUFF) ×2 IMPLANT
DRAPE INCISE IOBAN 66X45 STRL (DRAPES) ×2 IMPLANT
DRAPE U-SHAPE 47X51 STRL (DRAPES) ×2 IMPLANT
DURAPREP 26ML APPLICATOR (WOUND CARE) ×2 IMPLANT
ELECT BLADE TIP CTD 4 INCH (ELECTRODE) ×2 IMPLANT
ELECT PENCIL ROCKER SW 15FT (MISCELLANEOUS) ×2 IMPLANT
ELECT REM PT RETURN 15FT ADLT (MISCELLANEOUS) ×2 IMPLANT
FEMUR CMT CCR STD SZ7 R KNEE (Knees) ×1 IMPLANT
FEMUR CMTD CCR STD SZ7 R KNEE (Knees) IMPLANT
GAUZE PAD ABD 8X10 STRL (GAUZE/BANDAGES/DRESSINGS) ×4 IMPLANT
GAUZE SPONGE 4X4 12PLY STRL (GAUZE/BANDAGES/DRESSINGS) ×2 IMPLANT
GAUZE XEROFORM 1X8 LF (GAUZE/BANDAGES/DRESSINGS) IMPLANT
GLOVE BIO SURGEON STRL SZ7.5 (GLOVE) ×2 IMPLANT
GLOVE BIOGEL PI IND STRL 8 (GLOVE) ×4 IMPLANT
GLOVE ECLIPSE 8.0 STRL XLNG CF (GLOVE) ×2 IMPLANT
GOWN STRL REUS W/ TWL XL LVL3 (GOWN DISPOSABLE) ×4 IMPLANT
HOLDER FOLEY CATH W/STRAP (MISCELLANEOUS) IMPLANT
IMMOBILIZER KNEE 20 (SOFTGOODS) ×1 IMPLANT
IMMOBILIZER KNEE 20 THIGH 36 (SOFTGOODS) ×2 IMPLANT
INSERT TIB POST EF/6-9 20 RT (Insert) IMPLANT
KIT TURNOVER KIT A (KITS) IMPLANT
MANIFOLD NEPTUNE II (INSTRUMENTS) ×2 IMPLANT
NS IRRIG 1000ML POUR BTL (IV SOLUTION) ×2 IMPLANT
PACK TOTAL KNEE CUSTOM (KITS) ×2 IMPLANT
PAD COLD SHLDR WRAP-ON (PAD) ×2 IMPLANT
PADDING CAST COTTON 6X4 STRL (CAST SUPPLIES) ×4 IMPLANT
PIN DRILL HDLS TROCAR 75 4PK (PIN) IMPLANT
PROTECTOR NERVE ULNAR (MISCELLANEOUS) ×2 IMPLANT
SCREW FEMALE HEX FIX 25X2.5 (ORTHOPEDIC DISPOSABLE SUPPLIES) IMPLANT
SET HNDPC FAN SPRY TIP SCT (DISPOSABLE) ×2 IMPLANT
SET PAD KNEE POSITIONER (MISCELLANEOUS) ×2 IMPLANT
SPIKE FLUID TRANSFER (MISCELLANEOUS) IMPLANT
STAPLER SKIN PROX WIDE 3.9 (STAPLE) IMPLANT
STEM POLY PAT PLY 32M KNEE (Knees) IMPLANT
STEM TIB ST PERS 14+30 (Stem) IMPLANT
STEM TIBIA 5 DEG SZ E R KNEE (Knees) IMPLANT
STRIP CLOSURE SKIN 1/2X4 (GAUZE/BANDAGES/DRESSINGS) IMPLANT
SUT MNCRL AB 4-0 PS2 18 (SUTURE) IMPLANT
SUT VIC AB 0 CT1 36 (SUTURE) ×4 IMPLANT
SUT VIC AB 1 CT1 36 (SUTURE) ×4 IMPLANT
SUT VIC AB 2-0 CT1 TAPERPNT 27 (SUTURE) ×4 IMPLANT
TIBIA STEM 5 DEG SZ E R KNEE (Knees) ×1 IMPLANT
TOWEL GREEN STERILE FF (TOWEL DISPOSABLE) ×2 IMPLANT
TRAY FOLEY MTR SLVR 16FR STAT (SET/KITS/TRAYS/PACK) IMPLANT
WATER STERILE IRR 1000ML POUR (IV SOLUTION) ×4 IMPLANT
YANKAUER SUCT BULB TIP NO VENT (SUCTIONS) ×2 IMPLANT

## 2023-08-26 NOTE — Anesthesia Procedure Notes (Signed)
 Date/Time: 08/26/2023 1:08 PM  Performed by: Florene Route, CRNAOxygen Delivery Method: Simple face mask

## 2023-08-26 NOTE — Anesthesia Preprocedure Evaluation (Addendum)
 Anesthesia Evaluation  Patient identified by MRN, date of birth, ID band Patient awake    Reviewed: Allergy & Precautions, NPO status , Patient's Chart, lab work & pertinent test results  Airway Mallampati: II  TM Distance: >3 FB Neck ROM: Full    Dental no notable dental hx. (+) Dental Advisory Given, Teeth Intact   Pulmonary asthma , sleep apnea , COPD,  COPD inhaler   Pulmonary exam normal breath sounds clear to auscultation       Cardiovascular hypertension, Pt. on medications  Rhythm:Regular Rate:Normal + Systolic murmurs Stress 08/2020  The left ventricular ejection fraction is normal (55-65%).  Nuclear stress EF: 64%.  There was no ST segment deviation noted during stress.  This is a low risk study.   No ischemia or infarction on perfusion images. Normal wall motion.   Echo 2021  1. Left ventricular ejection fraction, by estimation, is 55 to 60%. The left ventricle has normal function. The left ventricle has no regional wall motion abnormalities. Left ventricular diastolic parameters are consistent with Grade I diastolic dysfunction (impaired relaxation).   2. Right ventricular systolic function is normal. The right ventricular size is normal. There is normal pulmonary artery systolic pressure.   3. The mitral valve is normal in structure. No evidence of mitral valve regurgitation. No evidence of mitral stenosis.   4. The aortic valve is tricuspid. Aortic valve regurgitation is not visualized. No aortic stenosis is present.   5. The inferior vena cava is normal in size with greater than 50% respiratory variability, suggesting right atrial pressure of 3 mmHg.      Neuro/Psych  Neuromuscular disease    GI/Hepatic Neg liver ROS,GERD  ,,  Endo/Other  diabetes    Renal/GU negative Renal ROS     Musculoskeletal  (+) Arthritis ,    Abdominal  (+) + obese  Peds  Hematology negative hematology ROS (+)    Anesthesia Other Findings   Reproductive/Obstetrics                             Anesthesia Physical Anesthesia Plan  ASA: 3  Anesthesia Plan: Spinal   Post-op Pain Management: Tylenol PO (pre-op)* and Regional block*   Induction: Intravenous  PONV Risk Score and Plan: Ondansetron, Dexamethasone, Propofol infusion, TIVA, Treatment may vary due to age or medical condition and Midazolam  Airway Management Planned: Natural Airway  Additional Equipment:   Intra-op Plan:   Post-operative Plan:   Informed Consent: I have reviewed the patients History and Physical, chart, labs and discussed the procedure including the risks, benefits and alternatives for the proposed anesthesia with the patient or authorized representative who has indicated his/her understanding and acceptance.     Dental advisory given  Plan Discussed with: CRNA  Anesthesia Plan Comments:        Anesthesia Quick Evaluation

## 2023-08-26 NOTE — Transfer of Care (Signed)
 Immediate Anesthesia Transfer of Care Note  Patient: Leah Hampton  Procedure(s) Performed: ARTHROPLASTY, KNEE, TOTAL (Right: Knee)  Patient Location: PACU  Anesthesia Type:Spinal  Level of Consciousness: sedated  Airway & Oxygen Therapy: Patient Spontanous Breathing and Patient connected to face mask oxygen  Post-op Assessment: Report given to RN and Post -op Vital signs reviewed and stable  Post vital signs: Reviewed and stable  Last Vitals:  Vitals Value Taken Time  BP    Temp    Pulse 93 08/26/23 1531  Resp 23 08/26/23 1531  SpO2 98 % 08/26/23 1531  Vitals shown include unfiled device data.  Last Pain:  Vitals:   08/26/23 1020  TempSrc: Oral         Complications: No notable events documented.

## 2023-08-26 NOTE — Op Note (Signed)
 Operative Note  Date of operation: 08/26/2023 Preoperative diagnosis: Right knee severe osteoarthritis and degenerative joint disease with varus malalignment Postoperative diagnosis: Same  Procedure: Right cemented total knee arthroplasty  Implants: Implant Name Type Inv. Item Serial No. Manufacturer Lot No. LRB No. Used Action  CEMENT BONE R 1X40 - [id number] Cement CEMENT BONE R 1X40  ZIMMER RECON(ORTH,TRAU,BIO,SG) [id number] Right 2 Implanted  FEMUR CMT CCR STD SZ7 R KNEE - [id number] Knees FEMUR CMT CCR STD SZ7 R KNEE  ZIMMER RECON(ORTH,TRAU,BIO,SG) [id number] Right 1 Implanted  STEM TIB ST PERS 14+30 - [id number] Stem STEM TIB ST PERS 14+30  ZIMMER RECON(ORTH,TRAU,BIO,SG) [id number] Right 1 Implanted  TIBIA STEM 5 DEG SZ E R KNEE - [id number] Knees TIBIA STEM 5 DEG SZ E R KNEE  ZIMMER RECON(ORTH,TRAU,BIO,SG) [id number] Right 1 Implanted  STEM POLY PAT PLY 96M KNEE - [id number] Knees STEM POLY PAT PLY 96M KNEE  ZIMMER RECON(ORTH,TRAU,BIO,SG) [id number] Right 1 Implanted  PERSONA RIGHT 20mm HEIGHT CPS ARTICULAR SURFACE    ZIMMER RECON(ORTH,TRAU,BIO,SG) [id number] Right 1 Implanted   Surgeon: Vanita Panda. Magnus Ivan, MD Assistant: Rexene Edison, PA-C  Anesthesia: #1 right lower extremity adductor canal block, #2 spinal, #3 local Tourniquet time: 67 minutes Antibiotics: IV Ancef Complications: None  Indications: The patient is a 68 year old female with severe debilitating arthritis involving her right knee with significant varus malalignment and her flexion contracture as well.  She has x-rays showing complete loss of joint space with bone-on-bone wear and a significant deformity of the knee.  She has tried and failed all forms of conservative treatment.  Her right knee pain is daily and it is detrimentally affecting her mobility, her quality of life and her actives daily living.  She wishes to proceed with a knee replacement.  She understands that this will be quite difficult given the deformity of  her knee combined with her morbid obesity.  Her BMI is 38.33.  She understands there is a heightened risks of nerve and vessel injury, implant failure, DVT, infection and wound healing issues.  She understands that our goals are hopefully decreased pain, improved mobility and improved quality of life.  Procedure description: After informed consent was obtained and the appropriate right knee was marked, anesthesia obtained a right lower extremity adductor canal block in the holding room.  The patient was then brought to the operating room and set up on the operating table where spinal anesthesia obtained.  The patient was then laid in supine position on the operating table and a Foley catheter was placed.  A nonsterile tractors placed around upper right thigh and her right thigh, knee, leg and ankle were prepped and draped with DuraPrep and sterile drapes including a sterile stockinette.  Again it was noted that she has a significant flexion contracture of her knee and malalignment with varus malalignment.  A timeout was called and she was identified as the correct patient the correct right knee.  An Esmarch was then used to wrap out the leg and the tourniquet was plated to 300 mm pressure.  With the knee extended a direct midline incision was made over the patella and carried proximally distally.  Dissection was carried down to the knee joint and a medial parapatellar arthrotomy is made finding a large joint effusion and significant arthritis with significant deformity in the right knee.  We removed remnants of the ACL as well as medial lateral meniscus and osteophytes in all 3 compartments.  We set our extramedullary guide to take 2 mm for the off  the low side for a proximal tibia cut correction for varus and valgus and a 5 degree slope.  We did have to backed this down to more millimeters close the first cut essentially gotten almost no bone.  We then even backed it down to more millimeters.  We then used an  intramedullary cutting guide for distal femur cut for right knee setting this for 5 degrees externally rotated and a 10 mm distal femoral cut.  We brought the knee back down to full extension and for that cut and she definitely hyperextended and we knew that we were going to need more constrained type single plan is for her knee.  We then back to the femur and put a femoral sizing guide based off the epicondylar axis.  Based off of this we chose a size 7 femur.  We put a 4-in-1 cutting block for a size 7 femur and made our anterior and posterior cuts followed our chamfer cuts.  We then backed the tibia and chose a size E right tibial tray segment rotation of the tibial tubercle and the femur.  We prepared the tibial cut and keel punch for PS implants as well as a longer stem for constrained insert.  We then trialed our size E tibia combined with our size 7 PS standard femur.  We trialed up to a 20 mm thickness constrained polythene insert and we are more pleased with range of motion and stability without insert.  We then made a patella cut and drilled 3 holes for a size 32 patella button.  We then trialed the knee with all trial implants in place.  We then removed all trial implants and irrigate the knee with normal saline solution using pulsatile lavage.  We then placed Marcaine with epinephrine around the arthrotomy.  Next we dried the knee roll well and with the knee in a flexed position we mixed our cement on the back table and then cemented our Biomet/Zimmer size E right tibia with a 30 mm extension stem we cemented our size 7 right PS standard femur.  We placed our 20 mm thickness CPS polythene insert and cemented our size 32 patella button.  We then held the knee fully extended and compressed while the cemented hardened.  Once the cemented hardened we let trending down and hemostasis was obtained with electrocautery.  The arthrotomy was then closed with interrupted #1 Vicryl suture followed by 0 Vicryl to  close the deep tissue and 2-0 Vicryl close subcutaneous tissue.  The skin was closed with staples.  Well-padded sterile dressing was applied.  The patient was taken recovery room.  Rexene Edison, PA-C did assist during the entire case and beginning to end and his assistance was crucial and medically necessary for soft tissue management and retraction, helping guide implant placement and a layered closure of the wound.

## 2023-08-26 NOTE — Discharge Instructions (Addendum)
 Information on my medicine - XARELTO (Rivaroxaban)  This medication education was reviewed with me or my healthcare representative as part of my discharge preparation.  Why was Xarelto prescribed for you? Xarelto was prescribed for you to reduce the risk of blood clots forming after orthopedic surgery. The medical term for these abnormal blood clots is venous thromboembolism (VTE).  What do you need to know about xarelto ? Take your Xarelto ONCE DAILY at the same time every day. You may take it either with or without food.  If you have difficulty swallowing the tablet whole, you may crush it and mix in applesauce just prior to taking your dose.  Take Xarelto exactly as prescribed by your doctor and DO NOT stop taking Xarelto without talking to the doctor who prescribed the medication.  Stopping without other VTE prevention medication to take the place of Xarelto may increase your risk of developing a clot.  After discharge, you should have regular check-up appointments with your healthcare provider that is prescribing your Xarelto.    What do you do if you miss a dose? If you miss a dose, take it as soon as you remember on the same day then continue your regularly scheduled once daily regimen the next day. Do not take two doses of Xarelto on the same day.   Important Safety Information A possible side effect of Xarelto is bleeding. You should call your healthcare provider right away if you experience any of the following: Bleeding from an injury or your nose that does not stop. Unusual colored urine (red or dark brown) or unusual colored stools (red or black). Unusual bruising for unknown reasons. A serious fall or if you hit your head (even if there is no bleeding).  Some medicines may interact with Xarelto and might increase your risk of bleeding while on Xarelto. To help avoid this, consult your healthcare provider or pharmacist prior to using any new prescription or  non-prescription medications, including herbals, vitamins, non-steroidal anti-inflammatory drugs (NSAIDs) and supplements.  This website has more information on Xarelto: [email or web address].  Per [facility] clinic policy, our goal is ensure optimal postoperative pain control with a multimodal pain management strategy. For all [facility] patients, our goal is to wean post-operative narcotic medications by 6 weeks post-operatively. If this is not possible due to utilization of pain medication prior to surgery, your [facility] doctor will support your acute post-operative pain control for the first 6 weeks postoperatively, with a plan to transition you back to your primary pain team following that. Max Spain will work to ensure a Therapist, occupational.  INSTRUCTIONS AFTER JOINT REPLACEMENT   Remove items at home which could result in a fall. This includes throw rugs or furniture in walking pathways ICE to the affected joint every three hours while awake for 30 minutes at a time, for at least the first 3-5 days, and then as needed for pain and swelling.  Continue to use ice for pain and swelling. You may notice swelling that will progress down to the foot and ankle.  This is normal after surgery.  Elevate your leg when you are not up walking on it.   Continue to use the breathing machine you got in the hospital (incentive spirometer) which will help keep your temperature down.  It is common for your temperature to cycle up and down following surgery, especially at night when you are not up moving around and exerting yourself.  The breathing machine keeps your lungs expanded and your temperature down.  DIET:  As you were doing prior to hospitalization, we recommend a well-balanced diet.  DRESSING / WOUND CARE / SHOWERING  Keep the surgical dressing until follow up.  The dressing is water proof, so you can shower without any extra covering.  IF THE DRESSING FALLS OFF or the wound gets wet inside, change the dressing  with sterile gauze.  Please use good hand washing techniques before changing the dressing.  Do not use any lotions or creams on the incision until instructed by your surgeon.    ACTIVITY  Increase activity slowly as tolerated, but follow the weight bearing instructions below.   No driving for 6 weeks or until further direction given by your physician.  You cannot drive while taking narcotics.  No lifting or carrying greater than 10 lbs. until further directed by your surgeon. Avoid periods of inactivity such as sitting longer than an hour when not asleep. This helps prevent blood clots.  You may return to work once you are authorized by your doctor.     WEIGHT BEARING   Weight bearing as tolerated with assist device (walker, cane, etc) as directed, use it as long as suggested by your surgeon or therapist, typically at least 4-6 weeks.   EXERCISES  Results after joint replacement surgery are often greatly improved when you follow the exercise, range of motion and muscle strengthening exercises prescribed by your doctor. Safety measures are also important to protect the joint from further injury. Any time any of these exercises cause you to have increased pain or swelling, decrease what you are doing until you are comfortable again and then slowly increase them. If you have problems or questions, call your caregiver or physical therapist for advice.   Rehabilitation is important following a joint replacement. After just a few days of immobilization, the muscles of the leg can become weakened and shrink (atrophy).  These exercises are designed to build up the tone and strength of the thigh and leg muscles and to improve motion. Often times heat used for twenty to thirty minutes before working out will loosen up your tissues and help with improving the range of motion but do not use heat for the first two weeks following surgery (sometimes heat can increase post-operative swelling).   These  exercises can be done on a training (exercise) mat, on the floor, on a table or on a bed. Use whatever works the best and is most comfortable for you.    Use music or television while you are exercising so that the exercises are a pleasant break in your day. This will make your life better with the exercises acting as a break in your routine that you can look forward to.   Perform all exercises about fifteen times, three times per day or as directed.  You should exercise both the operative leg and the other leg as well.  Exercises include:   Quad Sets - Tighten up the muscle on the front of the thigh (Quad) and hold for 5-10 seconds.   Straight Leg Raises - With your knee straight (if you were given a brace, keep it on), lift the leg to 60 degrees, hold for 3 seconds, and slowly lower the leg.  Perform this exercise against resistance later as your leg gets stronger.  Leg Slides: Lying on your back, slowly slide your foot toward your buttocks, bending your knee up off the floor (only go as far as is comfortable). Then slowly slide your foot back down until  your leg is flat on the floor again.  Angel Wings: Lying on your back spread your legs to the side as far apart as you can without causing discomfort.  Hamstring Strength:  Lying on your back, push your heel against the floor with your leg straight by tightening up the muscles of your buttocks.  Repeat, but this time bend your knee to a comfortable angle, and push your heel against the floor.  You may put a pillow under the heel to make it more comfortable if necessary.   A rehabilitation program following joint replacement surgery can speed recovery and prevent re-injury in the future due to weakened muscles. Contact your doctor or a physical therapist for more information on knee rehabilitation.    CONSTIPATION  Constipation is defined medically as fewer than three stools per week and severe constipation as less than one stool per week.  Even if  you have a regular bowel pattern at home, your normal regimen is likely to be disrupted due to multiple reasons following surgery.  Combination of anesthesia, postoperative narcotics, change in appetite and fluid intake all can affect your bowels.   YOU MUST use at least one of the following options; they are listed in order of increasing strength to get the job done.  They are all available over the counter, and you may need to use some, POSSIBLY even all of these options:    Drink plenty of fluids (prune juice may be helpful) and high fiber foods Colace 100 mg by mouth twice a day  Senokot for constipation as directed and as needed Dulcolax (bisacodyl), take with full glass of water  Miralax (polyethylene glycol) once or twice a day as needed.  If you have tried all these things and are unable to have a bowel movement in the first 3-4 days after surgery call either your surgeon or your primary doctor.    If you experience loose stools or diarrhea, hold the medications until you stool forms back up.  If your symptoms do not get better within 1 week or if they get worse, check with your doctor.  If you experience "the worst abdominal pain ever" or develop nausea or vomiting, please contact the office immediately for further recommendations for treatment.   ITCHING:  If you experience itching with your medications, try taking only a single pain pill, or even half a pain pill at a time.  You can also use Benadryl over the counter for itching or also to help with sleep.   TED HOSE STOCKINGS:  Use stockings on both legs until for at least 2 weeks or as directed by physician office. They may be removed at night for sleeping.  MEDICATIONS:  See your medication summary on the "After Visit Summary" that nursing will review with you.  You may have some home medications which will be placed on hold until you complete the course of blood thinner medication.  It is important for you to complete the blood  thinner medication as prescribed.  PRECAUTIONS:  If you experience chest pain or shortness of breath - call 911 immediately for transfer to the hospital emergency department.   If you develop a fever greater that 101 F, purulent drainage from wound, increased redness or drainage from wound, foul odor from the wound/dressing, or calf pain - CONTACT YOUR SURGEON.  FOLLOW-UP APPOINTMENTS:  If you do not already have a post-op appointment, please call the office for an appointment to be seen by your surgeon.  Guidelines for how soon to be seen are listed in your "After Visit Summary", but are typically between 1-4 weeks after surgery.  OTHER INSTRUCTIONS:   Knee Replacement:  Do not place pillow under knee, focus on keeping the knee straight while resting. CPM instructions: 0-90 degrees, 2 hours in the morning, 2 hours in the afternoon, and 2 hours in the evening. Place foam block, curve side up under heel at all times except when in CPM or when walking.  DO NOT modify, tear, cut, or change the foam block in any way.  POST-OPERATIVE OPIOID TAPER INSTRUCTIONS: It is important to wean off of your opioid medication as soon as possible. If you do not need pain medication after your surgery it is ok to stop day one. Opioids include: Codeine, Hydrocodone(Norco, Vicodin), Oxycodone(Percocet, oxycontin) and hydromorphone amongst others.  Long term and even short term use of opiods can cause: Increased pain response Dependence Constipation Depression Respiratory depression And more.  Withdrawal symptoms can include Flu like symptoms Nausea, vomiting And more Techniques to manage these symptoms Hydrate well Eat regular healthy meals Stay active Use relaxation techniques(deep breathing, meditating, yoga) Do Not substitute Alcohol to help with tapering If you have been on opioids for less than two weeks and do not have pain than it is ok to stop all  together.  Plan to wean off of opioids This plan should start within one week post op of your joint replacement. Maintain the same interval or time between taking each dose and first decrease the dose.  Cut the total daily intake of opioids by one tablet each day Next start to increase the time between doses. The last dose that should be eliminated is the evening dose.   MAKE SURE YOU:  Understand these instructions.  Get help right away if you are not doing well or get worse.    Thank you for letting us  be a part of your medical care team.  It is a privilege we respect greatly.  We hope these instructions will help you stay on track for a fast and full recovery!

## 2023-08-26 NOTE — Plan of Care (Signed)
  Problem: Education: Goal: Knowledge of General Education information will improve Description: Including pain rating scale, medication(s)/side effects and non-pharmacologic comfort measures Outcome: Progressing   Problem: Health Behavior/Discharge Planning: Goal: Ability to manage health-related needs will improve Outcome: Progressing   Problem: Clinical Measurements: Goal: Ability to maintain clinical measurements within normal limits will improve Outcome: Progressing Goal: Will remain free from infection Outcome: Progressing Goal: Diagnostic test results will improve Outcome: Progressing Goal: Respiratory complications will improve Outcome: Progressing Goal: Cardiovascular complication will be avoided Outcome: Progressing   Problem: Activity: Goal: Risk for activity intolerance will decrease Outcome: Progressing   Problem: Nutrition: Goal: Adequate nutrition will be maintained Outcome: Progressing   Problem: Coping: Goal: Level of anxiety will decrease Outcome: Progressing   Problem: Elimination: Goal: Will not experience complications related to bowel motility Outcome: Progressing Goal: Will not experience complications related to urinary retention Outcome: Progressing   Problem: Pain Managment: Goal: General experience of comfort will improve and/or be controlled Outcome: Progressing   Problem: Safety: Goal: Ability to remain free from injury will improve Outcome: Progressing   Problem: Skin Integrity: Goal: Risk for impaired skin integrity will decrease Outcome: Progressing   Problem: Education: Goal: Knowledge of General Education information will improve Description: Including pain rating scale, medication(s)/side effects and non-pharmacologic comfort measures Outcome: Progressing   Problem: Health Behavior/Discharge Planning: Goal: Ability to manage health-related needs will improve Outcome: Progressing   Problem: Clinical Measurements: Goal:  Ability to maintain clinical measurements within normal limits will improve Outcome: Progressing Goal: Will remain free from infection Outcome: Progressing Goal: Diagnostic test results will improve Outcome: Progressing   Problem: Nutrition: Goal: Adequate nutrition will be maintained Outcome: Adequate for Discharge   Problem: Elimination: Goal: Will not experience complications related to bowel motility Outcome: Progressing Goal: Will not experience complications related to urinary retention Outcome: Progressing   Problem: Pain Managment: Goal: General experience of comfort will improve and/or be controlled Outcome: Progressing   Problem: Safety: Goal: Ability to remain free from injury will improve Outcome: Progressing   Problem: Skin Integrity: Goal: Risk for impaired skin integrity will decrease Outcome: Progressing   Problem: Education: Goal: Knowledge of General Education information will improve Description: Including pain rating scale, medication(s)/side effects and non-pharmacologic comfort measures Outcome: Progressing   Problem: Health Behavior/Discharge Planning: Goal: Ability to manage health-related needs will improve Outcome: Progressing   Problem: Clinical Measurements: Goal: Ability to maintain clinical measurements within normal limits will improve Outcome: Progressing Goal: Will remain free from infection Outcome: Progressing Goal: Diagnostic test results will improve Outcome: Progressing   Problem: Clinical Measurements: Goal: Will remain free from infection Outcome: Progressing

## 2023-08-26 NOTE — Interval H&P Note (Signed)
 History and Physical Interval Note: The patient understands that she is here today for a right total knee replacement to treat her severe right knee pain and arthritis.  There has been no acute or interval change in her medical status.  The risks and benefits of surgery have been discussed in detail and informed consent has been obtained.  The right operative knee has been marked.  08/26/2023 11:49 AM  Leah Hampton  has presented today for surgery, with the diagnosis of osteoarthritis right knee.  The various methods of treatment have been discussed with the patient and family. After consideration of risks, benefits and other options for treatment, the patient has consented to  Procedure(s): ARTHROPLASTY, KNEE, TOTAL (Right) as a surgical intervention.  The patient's history has been reviewed, patient examined, no change in status, stable for surgery.  I have reviewed the patient's chart and labs.  Questions were answered to the patient's satisfaction.     Kathryne Hitch

## 2023-08-26 NOTE — Anesthesia Procedure Notes (Addendum)
 Spinal  Patient location during procedure: OR Start time: 08/26/2023 1:08 PM End time: 08/26/2023 1:14 PM Reason for block: surgical anesthesia Staffing Performed: anesthesiologist  Anesthesiologist: Lewie Loron, MD Performed by: Lewie Loron, MD Authorized by: Atilano Median, DO   Preanesthetic Checklist Completed: patient identified, IV checked, site marked, risks and benefits discussed, surgical consent, monitors and equipment checked, pre-op evaluation and timeout performed Spinal Block Patient position: sitting Prep: DuraPrep and site prepped and draped Patient monitoring: heart rate, continuous pulse ox and blood pressure Approach: midline Location: L3-4 Injection technique: single-shot Needle Needle type: Spinocan  Needle gauge: 25 G Needle length: 9 cm Additional Notes Expiration date of kit checked and confirmed. Patient tolerated procedure well, without complications.

## 2023-08-26 NOTE — Anesthesia Postprocedure Evaluation (Signed)
 Anesthesia Post Note  Patient: Leah Hampton  Procedure(s) Performed: ARTHROPLASTY, KNEE, TOTAL (Right: Knee)     Patient location during evaluation: PACU Anesthesia Type: Spinal Level of consciousness: sedated and patient cooperative Pain management: pain level controlled Vital Signs Assessment: post-procedure vital signs reviewed and stable Respiratory status: spontaneous breathing Cardiovascular status: stable Anesthetic complications: no   No notable events documented.  Last Vitals:  Vitals:   08/26/23 1645 08/26/23 1708  BP: 128/69 138/73  Pulse: 60 60  Resp: (!) 23 18  Temp:  (!) 36.4 C  SpO2: 100% 100%    Last Pain:  Vitals:   08/26/23 1715  TempSrc:   PainSc: 6                  Lewie Loron

## 2023-08-27 DIAGNOSIS — Z96651 Presence of right artificial knee joint: Secondary | ICD-10-CM | POA: Diagnosis not present

## 2023-08-27 DIAGNOSIS — I10 Essential (primary) hypertension: Secondary | ICD-10-CM | POA: Diagnosis not present

## 2023-08-27 DIAGNOSIS — E119 Type 2 diabetes mellitus without complications: Secondary | ICD-10-CM | POA: Diagnosis not present

## 2023-08-27 DIAGNOSIS — M1711 Unilateral primary osteoarthritis, right knee: Secondary | ICD-10-CM | POA: Diagnosis not present

## 2023-08-27 DIAGNOSIS — Z79899 Other long term (current) drug therapy: Secondary | ICD-10-CM | POA: Diagnosis not present

## 2023-08-27 DIAGNOSIS — Z96652 Presence of left artificial knee joint: Secondary | ICD-10-CM | POA: Diagnosis not present

## 2023-08-27 LAB — CBC
HCT: 39.6 % (ref 36.0–46.0)
Hemoglobin: 12.2 g/dL (ref 12.0–15.0)
MCH: 30.3 pg (ref 26.0–34.0)
MCHC: 30.8 g/dL (ref 30.0–36.0)
MCV: 98.3 fL (ref 80.0–100.0)
Platelets: 210 10*3/uL (ref 150–400)
RBC: 4.03 MIL/uL (ref 3.87–5.11)
RDW: 14.8 % (ref 11.5–15.5)
WBC: 9.9 10*3/uL (ref 4.0–10.5)
nRBC: 0 % (ref 0.0–0.2)

## 2023-08-27 LAB — BASIC METABOLIC PANEL WITH GFR
Anion gap: 6 (ref 5–15)
BUN: 15 mg/dL (ref 8–23)
CO2: 26 mmol/L (ref 22–32)
Calcium: 8.7 mg/dL — ABNORMAL LOW (ref 8.9–10.3)
Chloride: 107 mmol/L (ref 98–111)
Creatinine, Ser: 0.7 mg/dL (ref 0.44–1.00)
GFR, Estimated: >60 mL/min (ref >60)
Glucose, Bld: 173 mg/dL — ABNORMAL HIGH (ref 70–99)
Potassium: 4.2 mmol/L (ref 3.5–5.1)
Sodium: 139 mmol/L (ref 135–145)

## 2023-08-27 MED ORDER — RIVAROXABAN 10 MG PO TABS: 10.0000 mg | ORAL_TABLET | Freq: Every day | ORAL | 0 refills | Status: DC

## 2023-08-27 MED ORDER — OXYCODONE HCL 5 MG PO TABS: 5.0000 mg | ORAL_TABLET | Freq: Four times a day (QID) | ORAL | 0 refills | Status: DC | PRN

## 2023-08-27 MED ORDER — METHOCARBAMOL 500 MG PO TABS: 500.0000 mg | ORAL_TABLET | Freq: Four times a day (QID) | ORAL | 1 refills | Status: DC | PRN

## 2023-08-27 NOTE — TOC Transition Note (Signed)
 Transition of Care [facility]) - Discharge Note   Patient Details  Name: Leah Hampton MRN: [id number] Date of Birth: [birth year]  Transition of Care [facility]) CM/SW Contact:  Delilah Fend, LCSW Phone Number: 08/27/2023, 11:03 AM   Clinical Narrative:     Met with pt and reviewed/ confirmed that rollator walker and HHPT prearranged via ortho MD office prior to surgery with [facility] and [facility].  Have alerted [employer] that walker needs to be delivered today to patient's room with anticipated dc today.  HH info placed on AVS.  No further TOC needs.  Final next level of care: Home w Home Health Services Barriers to Discharge: No Barriers Identified   Patient Goals and CMS Choice Patient states their goals for this hospitalization and ongoing recovery are:: return home          Discharge Placement                       Discharge Plan and Services Additional resources added to the After Visit Summary for                  DME Arranged: Walker rolling with seat DME Agency: [employer]       HH Arranged: PT HH Agency: [facility]        Social Drivers of Health (SDOH) Interventions SDOH Screenings   Food Insecurity: No Food Insecurity (08/26/2023)  Housing: Low Risk  (08/26/2023)  Transportation Needs: No Transportation Needs (08/26/2023)  Utilities: Not At Risk (08/26/2023)  Alcohol Screen: Low Risk  (11/25/2022)  Depression (PHQ2-9): Low Risk  (05/24/2023)  Financial Resource Strain: Low Risk  (05/19/2023)  Physical Activity: Sufficiently Active (05/19/2023)  Social Connections: Moderately Integrated (08/26/2023)  Stress: Stress Concern Present (05/19/2023)  Tobacco Use: Low Risk  (08/26/2023)  Health Literacy: Adequate Health Literacy (11/25/2022)     Readmission Risk Interventions     No data to display

## 2023-08-27 NOTE — Progress Notes (Signed)
 Subjective: 1 Day Post-Op Procedure(s) (LRB): ARTHROPLASTY, KNEE, TOTAL (Right) Patient reports pain as moderate.    Objective: Vital signs in last 24 hours: Temp:  [97.5 F (36.4 C)-97.7 F (36.5 C)] 97.6 F (36.4 C) (04/12 0953) Pulse Rate:  [60-93] 63 (04/12 0953) Resp:  [10-23] 18 (04/12 0953) BP: (110-142)/(54-84) 135/59 (04/12 0953) SpO2:  [94 %-100 %] 98 % (04/12 0953) FiO2 (%):  [21 %] 21 % (04/12 0947)  Intake/Output from previous day: 04/11 0701 - 04/12 0700 In: 2566.3 [P.O.:420; I.V.:1846.3; IV Piggyback:300] Out: 1265 [Urine:1215; Blood:50] Intake/Output this shift: Total I/O In: -  Out: 50 [Urine:50]  Recent Labs    08/27/23 0322  HGB 12.2   Recent Labs    08/27/23 0322  WBC 9.9  RBC 4.03  HCT 39.6  PLT 210   Recent Labs    08/27/23 0322  NA 139  K 4.2  CL 107  CO2 26  BUN 15  CREATININE 0.70  GLUCOSE 173*  CALCIUM 8.7*   No results for input(s): "LABPT", "INR" in the last 72 hours.  Sensation intact distally Intact pulses distally Dorsiflexion/Plantar flexion intact Incision: dressing C/D/I Compartment soft   Assessment/Plan: 1 Day Post-Op Procedure(s) (LRB): ARTHROPLASTY, KNEE, TOTAL (Right) Up with therapy Plan for discharge tomorrow Discharge home with home health      Arnie Lao 08/27/2023, 11:26 AM

## 2023-08-27 NOTE — Care Management Obs Status (Signed)
 MEDICARE OBSERVATION STATUS NOTIFICATION   Patient Details  Name: Leah Hampton MRN: [id number] Date of Birth: 1955/08/30   Medicare Observation Status Notification Given:  Yes    Delilah Fend, LCSW 08/27/2023, 11:02 AM

## 2023-08-27 NOTE — Progress Notes (Signed)
 Physical Therapy Treatment Patient Details Name: Leah Hampton MRN: [id number] DOB: [birth year] Today's Date: 08/27/2023   History of Present Illness Pt is a 68 year old female s/p R TKA on 08/26/23.  PMHx: obesity, HTN, HLD, DM, OA, Lt TKA in 2010, s/p gastric sleeve resection 2022    PT Comments  Pt ambulated in hallway and assisted back to bed.  Pt declined performing exercises at this time due to pain and fatigue (reports no sleep last night).  Pt encouraged to use incentive spirometer and perform ankle pumps x10 every waking hour.      If plan is discharge home, recommend the following: Help with stairs or ramp for entrance;Assistance with cooking/housework;Assist for transportation   Can travel by private vehicle        Equipment Recommendations  None recommended by PT    Recommendations for Other Services       Precautions / Restrictions Precautions Precautions: Fall;Knee Required Braces or Orthoses: Knee Immobilizer - Right Restrictions RLE Weight Bearing Per Provider Order: Weight bearing as tolerated     Mobility  Bed Mobility Overal bed mobility: Needs Assistance Bed Mobility: Sit to Supine       Sit to supine: Contact guard assist   General bed mobility comments: cues for self assist with use of gait belt for Rt LE    Transfers Overall transfer level: Needs assistance Equipment used: Rollator (4 wheels) Transfers: Sit to/from Stand Sit to Stand: Contact guard assist           General transfer comment: verbal cues for UE and LE positioning and weight shifting    Ambulation/Gait Ambulation/Gait assistance: Contact guard assist Gait Distance (Feet): 80 Feet Assistive device: Rollator (4 wheels) Gait Pattern/deviations: Step-to pattern, Decreased stance time - right, Antalgic Gait velocity: decr     General Gait Details: verbal cues for sequence, step length, posture   Stairs             Wheelchair Mobility     Tilt Bed     Modified Rankin (Stroke Patients Only)       Balance                                            Communication Communication Communication: No apparent difficulties  Cognition Arousal: Alert Behavior During Therapy: WFL for tasks assessed/performed   PT - Cognitive impairments: No apparent impairments                         Following commands: Intact      Cueing    Exercises      General Comments        Pertinent Vitals/Pain Pain Assessment Pain Assessment: 0-10 Pain Score: 8  Pain Location: right knee Pain Descriptors / Indicators: Sore, Aching Pain Intervention(s): Monitored during session, Repositioned, Ice applied    Home Living Family/patient expects to be discharged to:: Private residence Living Arrangements: Alone   Type of Home: House Home Access: Stairs to enter Entrance Stairs-Rails: Can reach both Entrance Stairs-Number of Steps: 14   Home Layout: One level Home Equipment: Other (comment) (has 3 friends that have RWs she can borrow)      Prior Function            PT Goals (current goals can now be found in the care plan section) Acute Rehab PT  Goals PT Goal Formulation: With patient Time For Goal Achievement: 09/03/23 Potential to Achieve Goals: Good Progress towards PT goals: Progressing toward goals    Frequency    7X/week      PT Plan      Co-evaluation              AM-PAC PT "6 Clicks" Mobility   Outcome Measure  Help needed turning from your back to your side while in a flat bed without using bedrails?: A Little Help needed moving from lying on your back to sitting on the side of a flat bed without using bedrails?: A Little Help needed moving to and from a bed to a chair (including a wheelchair)?: A Little Help needed standing up from a chair using your arms (e.g., wheelchair or bedside chair)?: A Little Help needed to walk in hospital room?: A Little Help needed climbing 3-5 steps  with a railing? : A Lot 6 Click Score: 17    End of Session Equipment Utilized During Treatment: Gait belt Activity Tolerance: Patient limited by pain Patient left: in bed;with call bell/phone within reach;with bed alarm set Nurse Communication: Mobility status PT Visit Diagnosis: Difficulty in walking, not elsewhere classified (R26.2)     Time: 9604-5409 PT Time Calculation (min) (ACUTE ONLY): 16 min  Charges:    $Gait Training: 8-22 mins PT General Charges $$ ACUTE PT VISIT: 1 Visit                     Blanch Bunde, DPT Physical Therapist Acute Rehabilitation Services Office: [phone]    Leah Hampton 08/27/2023, 3:56 PM

## 2023-08-27 NOTE — Evaluation (Signed)
 Physical Therapy Evaluation Patient Details Name: Leah Hampton MRN: [id number] DOB: [birth year] Today's Date: 08/27/2023  History of Present Illness  Pt is a 68 year old female s/p R TKA on 08/26/23.  PMHx: obesity, HTN, HLD, DM, OA, Lt TKA in 2010, s/p gastric sleeve resection 2022  Clinical Impression  Pt is s/p TKA resulting in the deficits listed below (see PT Problem List).  Pt will benefit from acute skilled PT to increase their independence and safety with mobility to allow discharge.  Pt assisted with ambulating in hallway and utilized rollator (as this was coordinated prior to surgery).  Pt able to ambulate well with rollator and feels rollator is stable (prefers over RW at this time).  Pt does state she has 3 friends that have all offerred their RWs if needed (in case she changes her mind).  Pt does report she has 14 steps she needs to be able to perform upon d/c.          If plan is discharge home, recommend the following: Help with stairs or ramp for entrance;Assistance with cooking/housework;Assist for transportation   Can travel by private vehicle        Equipment Recommendations Other (comment) (already coordinated rollator for d/c, pt does have access to RW if needed)  Recommendations for Other Services       Functional Status Assessment Patient has had a recent decline in their functional status and demonstrates the ability to make significant improvements in function in a reasonable and predictable amount of time.     Precautions / Restrictions Precautions Precautions: Fall;Knee Required Braces or Orthoses: Knee Immobilizer - Right Restrictions RLE Weight Bearing Per Provider Order: Weight bearing as tolerated      Mobility  Bed Mobility               General bed mobility comments: pt sitting EOB on arrival    Transfers Overall transfer level: Needs assistance Equipment used: Rolling walker (2 wheels) Transfers: Sit to/from Stand Sit to Stand:  Min assist           General transfer comment: verbal cues for UE and LE positioning and weight shifting    Ambulation/Gait Ambulation/Gait assistance: Contact guard assist Gait Distance (Feet): 100 Feet Assistive device: Rollator (4 wheels) Gait Pattern/deviations: Step-to pattern, Decreased stance time - right, Antalgic Gait velocity: decr     General Gait Details: verbal cues for sequence, step length, posture; mostly used rollator and pt stable and performing well (did have pt trial approx 20 ft with RW however she prefers rollator)  Careers information officer     Tilt Bed    Modified Rankin (Stroke Patients Only)       Balance                                             Pertinent Vitals/Pain Pain Assessment Pain Assessment: 0-10 Pain Score: 9  Pain Location: right knee Pain Descriptors / Indicators: Sore, Aching Pain Intervention(s): Monitored during session, Repositioned (waiting to take pain meds until after therapy)    Home Living Family/patient expects to be discharged to:: Private residence Living Arrangements: Alone   Type of Home: House Home Access: Stairs to enter Entrance Stairs-Rails: Can reach both Entrance Stairs-Number of Steps: 14   Home Layout: One level Home Equipment:  Other (comment) (has 3 friends that have RWs she can borrow)      Prior Function Prior Level of Function : Independent/Modified Independent                     Extremity/Trunk Assessment        Lower Extremity Assessment Lower Extremity Assessment: RLE deficits/detail RLE Deficits / Details: unable to perform SLR, maintained KI, limited extension prior to surgery       Communication   Communication Communication: No apparent difficulties    Cognition Arousal: Alert Behavior During Therapy: WFL for tasks assessed/performed   PT - Cognitive impairments: No apparent impairments                          Following commands: Intact       Cueing       General Comments      Exercises     Assessment/Plan    PT Assessment Patient needs continued PT services  PT Problem List Decreased strength;Decreased activity tolerance;Decreased mobility;Decreased knowledge of use of DME;Pain;Decreased range of motion;Decreased knowledge of precautions       PT Treatment Interventions Stair training;Gait training;DME instruction;Functional mobility training;Therapeutic activities;Therapeutic exercise;Patient/family education    PT Goals (Current goals can be found in the Care Plan section)  Acute Rehab PT Goals PT Goal Formulation: With patient Time For Goal Achievement: 09/03/23 Potential to Achieve Goals: Good    Frequency 7X/week     Co-evaluation               AM-PAC PT "6 Clicks" Mobility  Outcome Measure Help needed turning from your back to your side while in a flat bed without using bedrails?: A Little Help needed moving from lying on your back to sitting on the side of a flat bed without using bedrails?: A Little Help needed moving to and from a bed to a chair (including a wheelchair)?: A Little Help needed standing up from a chair using your arms (e.g., wheelchair or bedside chair)?: A Little Help needed to walk in hospital room?: A Little Help needed climbing 3-5 steps with a railing? : A Lot 6 Click Score: 17    End of Session Equipment Utilized During Treatment: Gait belt;Right knee immobilizer Activity Tolerance: Patient tolerated treatment well Patient left: in chair;with call bell/phone within reach;with nursing/sitter in room;with chair alarm set Nurse Communication: Mobility status PT Visit Diagnosis: Difficulty in walking, not elsewhere classified (R26.2)    Time: 9562-1308 PT Time Calculation (min) (ACUTE ONLY): 20 min   Charges:   PT Evaluation $PT Eval Low Complexity: 1 Low   PT General Charges $$ ACUTE PT VISIT: 1 Visit        Leah Hampton PT,  DPT Physical Therapist Acute Rehabilitation Services Office: [phone]   Leah Hampton 08/27/2023, 1:08 PM

## 2023-08-28 DIAGNOSIS — M1711 Unilateral primary osteoarthritis, right knee: Secondary | ICD-10-CM | POA: Diagnosis not present

## 2023-08-28 DIAGNOSIS — Z96652 Presence of left artificial knee joint: Secondary | ICD-10-CM | POA: Diagnosis not present

## 2023-08-28 DIAGNOSIS — E119 Type 2 diabetes mellitus without complications: Secondary | ICD-10-CM | POA: Diagnosis not present

## 2023-08-28 DIAGNOSIS — Z79899 Other long term (current) drug therapy: Secondary | ICD-10-CM | POA: Diagnosis not present

## 2023-08-28 DIAGNOSIS — I10 Essential (primary) hypertension: Secondary | ICD-10-CM | POA: Diagnosis not present

## 2023-08-28 NOTE — Plan of Care (Signed)
  Problem: Activity: Goal: Risk for activity intolerance will decrease Outcome: Progressing   Problem: Elimination: Goal: Will not experience complications related to urinary retention Outcome: Progressing   Problem: Pain Managment: Goal: General experience of comfort will improve and/or be controlled Outcome: Progressing   Problem: Safety: Goal: Ability to remain free from injury will improve Outcome: Progressing

## 2023-08-28 NOTE — Progress Notes (Signed)
 Physical Therapy Treatment Patient Details Name: Leah Hampton MRN: [id number] DOB: 1955/09/17 Today's Date: 08/28/2023   History of Present Illness Pt is a 68 year old female s/p R TKA on 08/26/23.  PMHx: obesity, HTN, HLD, DM, OA, Lt TKA in 2010, s/p gastric sleeve resection 2022    PT Comments  Pt progressing steadily; incr gait distance and activity tolerance although continues to require assist with bed mobility as well as incr time to complete all functional tasks. Anticipate pt will be ready for d/c tomorrow after stair training end education (pt has 14+ steps to enter home)      If plan is discharge home, recommend the following: Help with stairs or ramp for entrance;Assistance with cooking/housework;Assist for transportation   Can travel by private vehicle        Equipment Recommendations  None recommended by PT    Recommendations for Other Services       Precautions / Restrictions Precautions Precautions: Fall;Knee Restrictions Weight Bearing Restrictions Per Provider Order: No RLE Weight Bearing Per Provider Order: Weight bearing as tolerated     Mobility  Bed Mobility Overal bed mobility: Needs Assistance Bed Mobility: Supine to Sit, Sit to Supine     Supine to sit: Min assist, Contact guard Sit to supine: Min assist   General bed mobility comments: cues for self assist with use of gait belt for Rt LE, required assist from therapist to complete transitions. incr time needed (in recliner)    Transfers Overall transfer level: Needs assistance Equipment used: Rolling walker (2 wheels), Rollator (4 wheels) Transfers: Sit to/from Stand Sit to Stand: Contact guard assist           General transfer comment: verbal cues for UE and LE positioning and weight shifting; incr time and effort    Ambulation/Gait Ambulation/Gait assistance: Contact guard assist, Supervision Gait Distance (Feet): 115 Feet Assistive device: Rolling walker (2 wheels) Gait  Pattern/deviations: Step-to pattern, Decreased stance time - right, Antalgic Gait velocity: significantly decr     General Gait Details: ongoing education  for sequence, step length, posture; wt shift to RLE improving. slow gait velocity   Stairs             Wheelchair Mobility     Tilt Bed    Modified Rankin (Stroke Patients Only)       Balance                                            Communication Communication Communication: No apparent difficulties  Cognition Arousal: Alert Behavior During Therapy: WFL for tasks assessed/performed   PT - Cognitive impairments: No apparent impairments                         Following commands: Intact      Cueing Cueing Techniques: Verbal cues  Exercises Total Joint Exercises Ankle Circles/Pumps: AROM, Both, 10 reps Quad Sets: AROM, Strengthening, Both, 10 reps Heel Slides: AAROM, Right, 10 reps Hip ABduction/ADduction: AAROM, Right, 10 reps Straight Leg Raises: AAROM, Right, Strengthening, 10 reps    General Comments        Pertinent Vitals/Pain Pain Assessment Pain Assessment: 0-10 Pain Score: 4  Pain Location: right knee Pain Descriptors / Indicators: Sore, Aching Pain Intervention(s): Limited activity within patient's tolerance, Monitored during session, Premedicated before session, Repositioned    Home Living  Prior Function            PT Goals (current goals can now be found in the care plan section) Acute Rehab PT Goals PT Goal Formulation: With patient Time For Goal Achievement: 09/03/23 Potential to Achieve Goals: Good Progress towards PT goals: Progressing toward goals    Frequency    7X/week      PT Plan      Co-evaluation              AM-PAC PT "6 Clicks" Mobility   Outcome Measure  Help needed turning from your back to your side while in a flat bed without using bedrails?: A Little Help needed moving from  lying on your back to sitting on the side of a flat bed without using bedrails?: A Little Help needed moving to and from a bed to a chair (including a wheelchair)?: A Little Help needed standing up from a chair using your arms (e.g., wheelchair or bedside chair)?: A Little Help needed to walk in hospital room?: A Little Help needed climbing 3-5 steps with a railing? : A Lot 6 Click Score: 17    End of Session Equipment Utilized During Treatment: Gait belt Activity Tolerance: Patient tolerated treatment well Patient left: with call bell/phone within reach;in bed;with bed alarm set Nurse Communication: Mobility status PT Visit Diagnosis: Difficulty in walking, not elsewhere classified (R26.2)     Time: 4098-1191 PT Time Calculation (min) (ACUTE ONLY): 38 min  Charges:    $Gait Training: 23-37 mins $Therapeutic Exercise: 8-22 mins PT General Charges $$ ACUTE PT VISIT: 1 Visit                     Alexandria Ida, PT  Acute Rehab Dept [facility]) [phone]  08/28/2023    [facility] 08/28/2023, 3:59 PM

## 2023-08-28 NOTE — Progress Notes (Signed)
 Patient is POD 2 s/p right total knee arthroplasty by Dr. Lucienne Ryder.  Doing well.  Worked well with physical therapy today.  Pain is improved compared with yesterday.  No significant complaints aside from knee pain and denies any chest pain, shortness of breath, calf pain.  On exam, dressing clean dry and intact with no gross blood or drainage.  No calf tenderness.  Negative Homans' sign.  Intact ankle dorsiflexion and plantarflexion.  Not able to perform straight leg raise.  Plan is continue working with physical therapy.  She has about 14-16 stairs to enter her house so do not anticipate discharge home today.

## 2023-08-28 NOTE — Plan of Care (Signed)
  Problem: Pain Managment: Goal: General experience of comfort will improve and/or be controlled Outcome: Progressing   Problem: Safety: Goal: Ability to remain free from injury will improve Outcome: Progressing   Problem: Skin Integrity: Goal: Risk for impaired skin integrity will decrease Outcome: Progressing   Problem: Education: Goal: Knowledge of General Education information will improve Description: Including pain rating scale, medication(s)/side effects and non-pharmacologic comfort measures Outcome: Progressing   Problem: Activity: Goal: Risk for activity intolerance will decrease Outcome: Progressing

## 2023-08-28 NOTE — Plan of Care (Signed)

## 2023-08-28 NOTE — Progress Notes (Signed)
 Physical Therapy Treatment Patient Details Name: Leah Hampton MRN: [id number] DOB: [birth year] Today's Date: 08/28/2023   History of Present Illness Pt is a 68 year old female s/p R TKA on 08/26/23.  PMHx: obesity, HTN, HLD, DM, OA, Lt TKA in 2010, s/p gastric sleeve resection 2022    PT Comments  Pt progressing this session, amb 60' however needed to transition to RW from rollator d/t pt heavily reliant on UEs to offload RLE.  Pt has 14 steps to enter her home, will see how she does this afternoon, if pain control improves will attempt stair training.    If plan is discharge home, recommend the following: Help with stairs or ramp for entrance;Assistance with cooking/housework;Assist for transportation   Can travel by private vehicle        Equipment Recommendations  None recommended by PT    Recommendations for Other Services       Precautions / Restrictions Precautions Precautions: Fall;Knee Restrictions Weight Bearing Restrictions Per Provider Order: No RLE Weight Bearing Per Provider Order: Weight bearing as tolerated     Mobility  Bed Mobility               General bed mobility comments:  (in recliner)    Transfers Overall transfer level: Needs assistance Equipment used: Rolling walker (2 wheels), Rollator (4 wheels) Transfers: Sit to/from Stand Sit to Stand: Min assist, Contact guard assist           General transfer comment: verbal cues for UE and LE positioning and weight shifting; incr time and effort    Ambulation/Gait Ambulation/Gait assistance: Contact guard assist Gait Distance (Feet): 60 Feet Assistive device: Rolling walker (2 wheels), Rollator (4 wheels) Gait Pattern/deviations: Step-to pattern, Decreased stance time - right, Antalgic       General Gait Details: verbal cues for sequence, step length, posture; attempted wtih rollator however transitioned to RW as pt heavily reliant on UEs and rollator unable to support wt  safely   Stairs             Wheelchair Mobility     Tilt Bed    Modified Rankin (Stroke Patients Only)       Balance                                            Communication Communication Communication: No apparent difficulties  Cognition Arousal: Alert Behavior During Therapy: WFL for tasks assessed/performed   PT - Cognitive impairments: No apparent impairments                         Following commands: Intact      Cueing Cueing Techniques: Verbal cues  Exercises Total Joint Exercises Ankle Circles/Pumps: AROM, Both, 10 reps    General Comments        Pertinent Vitals/Pain Pain Assessment Pain Assessment: 0-10 Pain Score: 6  Pain Location: right knee Pain Descriptors / Indicators: Sore, Aching Pain Intervention(s): Limited activity within patient's tolerance, Monitored during session, Premedicated before session, Repositioned    Home Living                          Prior Function            PT Goals (current goals can now be found in the care plan section) Acute Rehab PT  Goals PT Goal Formulation: With patient Time For Goal Achievement: 09/03/23 Potential to Achieve Goals: Good Progress towards PT goals: Progressing toward goals    Frequency    7X/week      PT Plan      Co-evaluation              AM-PAC PT "6 Clicks" Mobility   Outcome Measure  Help needed turning from your back to your side while in a flat bed without using bedrails?: A Little Help needed moving from lying on your back to sitting on the side of a flat bed without using bedrails?: A Little Help needed moving to and from a bed to a chair (including a wheelchair)?: A Little Help needed standing up from a chair using your arms (e.g., wheelchair or bedside chair)?: A Little Help needed to walk in hospital room?: A Little Help needed climbing 3-5 steps with a railing? : A Lot 6 Click Score: 17    End of Session  Equipment Utilized During Treatment: Gait belt Activity Tolerance: Patient tolerated treatment well Patient left: in chair;with call bell/phone within reach;with chair alarm set   PT Visit Diagnosis: Difficulty in walking, not elsewhere classified (R26.2)     Time: 0950-1009 PT Time Calculation (min) (ACUTE ONLY): 19 min  Charges:    $Gait Training: 8-22 mins PT General Charges $$ ACUTE PT VISIT: 1 Visit                     Syesha Thaw, PT  Acute Rehab Dept [facility]) [phone]  08/28/2023    [facility] 08/28/2023, 10:16 AM

## 2023-08-29 ENCOUNTER — Encounter (HOSPITAL_COMMUNITY): Payer: Self-pay | Admitting: Orthopaedic Surgery

## 2023-08-29 DIAGNOSIS — I10 Essential (primary) hypertension: Secondary | ICD-10-CM | POA: Diagnosis not present

## 2023-08-29 DIAGNOSIS — Z79899 Other long term (current) drug therapy: Secondary | ICD-10-CM | POA: Diagnosis not present

## 2023-08-29 DIAGNOSIS — M1711 Unilateral primary osteoarthritis, right knee: Secondary | ICD-10-CM | POA: Diagnosis not present

## 2023-08-29 DIAGNOSIS — Z96652 Presence of left artificial knee joint: Secondary | ICD-10-CM | POA: Diagnosis not present

## 2023-08-29 DIAGNOSIS — E119 Type 2 diabetes mellitus without complications: Secondary | ICD-10-CM | POA: Diagnosis not present

## 2023-08-29 NOTE — Progress Notes (Signed)
 Patient ID: Leah Hampton, female   DOB: 11/13/1955, 68 y.o.   MRN: [id number] The patient is making good progress overall.  Her right operative knee is stable.  She has already been up this morning.  The plan is to have her work with therapy on stair training and to discharge to home later today.

## 2023-08-29 NOTE — Progress Notes (Signed)
 Physical Therapy Treatment Patient Details Name: Leah Hampton MRN: [id number] DOB: 11/28/1955 Today's Date: 08/29/2023   History of Present Illness Pt is a 68 year old female s/p R TKA on 08/26/23.  PMHx: obesity, HTN, HLD, DM, OA, Lt TKA in 2010, s/p gastric sleeve resection 2022    PT Comments  Pt progressing well this session. Amb ~ 70' with RW and supervision for safety. Up/down 5 steps x2 with CGA - supervision; no LOB  knee buckling or  instability. Pt is ready to d/c home from PT standpoint with family assisting prn. Plan is for f/u HHPT   If plan is discharge home, recommend the following: Help with stairs or ramp for entrance;Assistance with cooking/housework;Assist for transportation   Can travel by private vehicle        Equipment Recommendations  None recommended by PT    Recommendations for Other Services       Precautions / Restrictions Precautions Precautions: Fall;Knee Precaution Booklet Issued: No Restrictions Weight Bearing Restrictions Per Provider Order: No RLE Weight Bearing Per Provider Order: Weight bearing as tolerated     Mobility  Bed Mobility               General bed mobility comments: in recliner    Transfers Overall transfer level: Needs assistance Equipment used: Rolling walker (2 wheels), Rollator (4 wheels) Transfers: Sit to/from Stand Sit to Stand: Supervision           General transfer comment: on going education for UE and LE positioning and weight shifting; incr time and effort    Ambulation/Gait Ambulation/Gait assistance: Contact guard assist, Supervision Gait Distance (Feet): 80 Feet Assistive device: Rolling walker (2 wheels) Gait Pattern/deviations: Step-to pattern, Decreased stance time - right, Antalgic Gait velocity: significantly decr -improved from prior sessions     General Gait Details: ongoing education  for sequence, step length, posture; wt shift to RLE improving. beginnign step through this session.  slow gait velocity   Stairs Stairs: Yes Stairs assistance: Contact guard assist Stair Management: Two rails, Step to pattern, Forwards Number of Stairs: 5 (x2) General stair comments: cues for sequence and technique. good stability, no LOB   Wheelchair Mobility     Tilt Bed    Modified Rankin (Stroke Patients Only)       Balance                                            Communication Communication Communication: No apparent difficulties  Cognition Arousal: Alert Behavior During Therapy: WFL for tasks assessed/performed   PT - Cognitive impairments: No apparent impairments                         Following commands: Intact      Cueing Cueing Techniques: Verbal cues  Exercises      General Comments        Pertinent Vitals/Pain Pain Assessment Pain Assessment: 0-10 Pain Score: 6  Pain Location: right knee Pain Descriptors / Indicators: Sore, Aching Pain Intervention(s): Limited activity within patient's tolerance, Monitored during session, Premedicated before session, Repositioned    Home Living                          Prior Function            PT Goals (current  goals can now be found in the care plan section) Acute Rehab PT Goals PT Goal Formulation: With patient Time For Goal Achievement: 09/03/23 Potential to Achieve Goals: Good Progress towards PT goals: Progressing toward goals    Frequency    7X/week      PT Plan      Co-evaluation              AM-PAC PT "6 Clicks" Mobility   Outcome Measure  Help needed turning from your back to your side while in a flat bed without using bedrails?: A Little Help needed moving from lying on your back to sitting on the side of a flat bed without using bedrails?: A Little Help needed moving to and from a bed to a chair (including a wheelchair)?: A Little Help needed standing up from a chair using your arms (e.g., wheelchair or bedside chair)?: A  Little Help needed to walk in hospital room?: A Little Help needed climbing 3-5 steps with a railing? : A Little 6 Click Score: 18    End of Session Equipment Utilized During Treatment: Gait belt Activity Tolerance: Patient tolerated treatment well Patient left: in chair;with call bell/phone within reach   PT Visit Diagnosis: Difficulty in walking, not elsewhere classified (R26.2)     Time: 6045-4098 PT Time Calculation (min) (ACUTE ONLY): 17 min  Charges:    $Gait Training: 8-22 mins PT General Charges $$ ACUTE PT VISIT: 1 Visit                     Sukhman Kocher, PT  Acute Rehab Dept [facility]) [phone]  08/29/2023    [facility] 08/29/2023, 10:56 AM

## 2023-08-29 NOTE — Discharge Summary (Signed)
 Patient ID: Leah Hampton MRN: [id number] DOB/AGE: 66/03/1956 66 y.o.  Admit date: 08/26/2023 Discharge date: 08/29/2023  Admission Diagnoses:  Principal Problem:   Unilateral primary osteoarthritis, right knee Active Problems:   Status post total right knee replacement   Discharge Diagnoses:  Same  Past Medical History:  Diagnosis Date   Bronchitis    Chronic bronchitis (HCC)    Cystitis    Hematuria    urologice eval, Dr Clifton Dames   History of chest pain    Hyperbilirubinemia    Hyperlipemia    Hypertelorism    Hypertension    Leukocytosis    Obesity    Osteoarthritis    right knee   Pre-diabetes    Sleep apnea 2012   slight   no CPAP   Vaginal Pap smear, abnormal    Vitamin D deficiency 02/09/2018    Surgeries: Procedure(s): ARTHROPLASTY, KNEE, TOTAL on 08/26/2023   Consultants:   Discharged Condition: Improved  Hospital Course: Leah Hampton is an 68 y.o. female who was admitted 08/26/2023 for operative treatment ofUnilateral primary osteoarthritis, right knee. Patient has severe unremitting pain that affects sleep, daily activities, and work/hobbies. After pre-op clearance the patient was taken to the operating room on 08/26/2023 and underwent  Procedure(s): ARTHROPLASTY, KNEE, TOTAL.    Patient was given perioperative antibiotics:  Anti-infectives (From admission, onward)    Start     Dose/Rate Route Frequency Ordered Stop   08/26/23 1900  ceFAZolin (ANCEF) IVPB 2g/100 mL premix        2 g 200 mL/hr over 30 Minutes Intravenous Every 6 hours 08/26/23 1659 08/27/23 0300   08/26/23 1015  ceFAZolin (ANCEF) IVPB 2g/100 mL premix        2 g 200 mL/hr over 30 Minutes Intravenous On call to O.R. 08/26/23 1010 08/26/23 1319        Patient was given sequential compression devices, early ambulation, and chemoprophylaxis to prevent DVT.  Patient benefited maximally from hospital stay and there were no complications.    Recent vital signs: Patient Vitals for  the past 24 hrs:  BP Temp Temp src Pulse Resp SpO2  08/29/23 0903 -- -- -- -- -- 97 %  08/29/23 0608 127/67 98.3 F (36.8 C) Oral 85 16 100 %  08/29/23 0450 (!) 123/59 98.1 F (36.7 C) Oral 100 18 100 %  08/28/23 2211 132/67 98.9 F (37.2 C) Oral 100 18 97 %  08/28/23 1938 -- -- -- -- -- 96 %     Recent laboratory studies:  Recent Labs    08/27/23 0322  WBC 9.9  HGB 12.2  HCT 39.6  PLT 210  NA 139  K 4.2  CL 107  CO2 26  BUN 15  CREATININE 0.70  GLUCOSE 173*  CALCIUM 8.7*     Discharge Medications:   Allergies as of 08/29/2023       Reactions   Sulfamethoxazole-trimethoprim [bactrim] Itching   Voltaren [diclofenac] Diarrhea   Codeine Nausea And Vomiting, Anxiety        Medication List     TAKE these medications    albuterol 108 (90 Base) MCG/ACT inhaler Commonly known as: VENTOLIN HFA USE 2 INHALATIONS BY MOUTH EVERY 6 HOURS AS NEEDED FOR WHEEZING  OR SHORTNESS OF BREATH   amLODipine 5 MG tablet Commonly known as: NORVASC TAKE 1 TABLET BY MOUTH DAILY   BARIATRIC MULTIVITAMINS PO Take 1 capsule by mouth daily in the afternoon.   FIBER GUMMIES PO Take 1-3 tablets by mouth  daily as needed (regularity.). Metamucil Fiber Gummies for Adults   losartan 100 MG tablet Commonly known as: COZAAR TAKE 1 TABLET BY MOUTH DAILY   methocarbamol 500 MG tablet Commonly known as: ROBAXIN Take 1 tablet (500 mg total) by mouth every 6 (six) hours as needed for muscle spasms.   Mounjaro 7.5 MG/0.5ML Pen Generic drug: tirzepatide Inject 7.5 mg into the skin once a week.   oxyCODONE 5 MG immediate release tablet Commonly known as: Oxy IR/ROXICODONE Take 1-2 tablets (5-10 mg total) by mouth every 6 (six) hours as needed for moderate pain (pain score 4-6) (pain score 4-6).   pravastatin 80 MG tablet Commonly known as: PRAVACHOL TAKE 1 TABLET BY MOUTH DAILY What changed: when to take this   Qvar RediHaler 80 MCG/ACT inhaler Generic drug: beclomethasone USE 1  INHALATION BY MOUTH INTO  THE LUNGS TWICE DAILY   rivaroxaban 10 MG Tabs tablet Commonly known as: XARELTO Take 1 tablet (10 mg total) by mouth daily with breakfast.   valACYclovir 1000 MG tablet Commonly known as: VALTREX TAKE 1 TABLET BY MOUTH DAILY   Viactiv Calcium Plus D 650-12.5-40 MG-MCG Chew Generic drug: Calcium-Vitamin D-Vitamin K Chew 1 tablet by mouth in the morning, at noon, and at bedtime.               Durable Medical Equipment  (From admission, onward)           Start     Ordered   08/26/23 1700  DME 3 n 1  Once        08/26/23 1659   08/26/23 1700  DME Walker rolling  Once       Question Answer Comment  Walker: With 5 Inch Wheels   Patient needs a walker to treat with the following condition Status post total right knee replacement      08/26/23 1659            Diagnostic Studies: DG Knee Right Port Result Date: 08/27/2023 CLINICAL DATA:  Status post right knee replacement. EXAM: PORTABLE RIGHT KNEE - 1-2 VIEW COMPARISON:  Preoperative radiograph 04/18/2023 FINDINGS: Right knee arthroplasty in expected alignment. No periprosthetic lucency or fracture. There has been patellar resurfacing. There is a minimally displaced fracture of the fibular head, not seen on preoperative exam. Recent postsurgical change includes air and edema in the soft tissues and joint space. Anterior skin staples in place. IMPRESSION: 1. Right knee arthroplasty in expected alignment. 2. Minimally displaced fracture of the proximal fibular head. Electronically Signed   By: Chadwick Colonel M.D.   On: 08/27/2023 08:59    Disposition: Discharge disposition: 01-Home or Self Care          Follow-up Information     Arnie Lao, MD Follow up in 2 week(s).   Specialty: Orthopedic Surgery Contact information: [address] [place]  [place] [address] [phone]         [facility] Follow up.   Specialty: Home Health Services Why: to provide home  physical therapy visits Contact information: [address] [place] [address] [phone]                  Signed: Arnie Lao 08/29/2023, 1:16 PM

## 2023-08-30 DIAGNOSIS — G4733 Obstructive sleep apnea (adult) (pediatric): Secondary | ICD-10-CM | POA: Diagnosis not present

## 2023-08-30 DIAGNOSIS — G5600 Carpal tunnel syndrome, unspecified upper limb: Secondary | ICD-10-CM | POA: Diagnosis not present

## 2023-08-30 DIAGNOSIS — Z96652 Presence of left artificial knee joint: Secondary | ICD-10-CM | POA: Diagnosis not present

## 2023-08-30 DIAGNOSIS — B351 Tinea unguium: Secondary | ICD-10-CM | POA: Diagnosis not present

## 2023-08-30 DIAGNOSIS — Z9884 Bariatric surgery status: Secondary | ICD-10-CM | POA: Diagnosis not present

## 2023-08-30 DIAGNOSIS — Z7901 Long term (current) use of anticoagulants: Secondary | ICD-10-CM | POA: Diagnosis not present

## 2023-08-30 DIAGNOSIS — I10 Essential (primary) hypertension: Secondary | ICD-10-CM | POA: Diagnosis not present

## 2023-08-30 DIAGNOSIS — Z602 Problems related to living alone: Secondary | ICD-10-CM | POA: Diagnosis not present

## 2023-08-30 DIAGNOSIS — K219 Gastro-esophageal reflux disease without esophagitis: Secondary | ICD-10-CM | POA: Diagnosis not present

## 2023-08-30 DIAGNOSIS — E119 Type 2 diabetes mellitus without complications: Secondary | ICD-10-CM | POA: Diagnosis not present

## 2023-08-30 DIAGNOSIS — E739 Lactose intolerance, unspecified: Secondary | ICD-10-CM | POA: Diagnosis not present

## 2023-08-30 DIAGNOSIS — E782 Mixed hyperlipidemia: Secondary | ICD-10-CM | POA: Diagnosis not present

## 2023-08-30 DIAGNOSIS — B353 Tinea pedis: Secondary | ICD-10-CM | POA: Diagnosis not present

## 2023-08-30 DIAGNOSIS — M160 Bilateral primary osteoarthritis of hip: Secondary | ICD-10-CM | POA: Diagnosis not present

## 2023-08-30 DIAGNOSIS — Z7951 Long term (current) use of inhaled steroids: Secondary | ICD-10-CM | POA: Diagnosis not present

## 2023-08-30 DIAGNOSIS — Z5982 Transportation insecurity: Secondary | ICD-10-CM | POA: Diagnosis not present

## 2023-08-30 DIAGNOSIS — J4489 Other specified chronic obstructive pulmonary disease: Secondary | ICD-10-CM | POA: Diagnosis not present

## 2023-08-30 DIAGNOSIS — Z471 Aftercare following joint replacement surgery: Secondary | ICD-10-CM | POA: Diagnosis not present

## 2023-08-30 DIAGNOSIS — Z7985 Long-term (current) use of injectable non-insulin antidiabetic drugs: Secondary | ICD-10-CM | POA: Diagnosis not present

## 2023-08-30 DIAGNOSIS — Z96651 Presence of right artificial knee joint: Secondary | ICD-10-CM | POA: Diagnosis not present

## 2023-08-30 DIAGNOSIS — E559 Vitamin D deficiency, unspecified: Secondary | ICD-10-CM | POA: Diagnosis not present

## 2023-09-01 DIAGNOSIS — B351 Tinea unguium: Secondary | ICD-10-CM | POA: Diagnosis not present

## 2023-09-01 DIAGNOSIS — E782 Mixed hyperlipidemia: Secondary | ICD-10-CM | POA: Diagnosis not present

## 2023-09-01 DIAGNOSIS — M160 Bilateral primary osteoarthritis of hip: Secondary | ICD-10-CM | POA: Diagnosis not present

## 2023-09-01 DIAGNOSIS — Z96652 Presence of left artificial knee joint: Secondary | ICD-10-CM | POA: Diagnosis not present

## 2023-09-01 DIAGNOSIS — E739 Lactose intolerance, unspecified: Secondary | ICD-10-CM | POA: Diagnosis not present

## 2023-09-01 DIAGNOSIS — Z7985 Long-term (current) use of injectable non-insulin antidiabetic drugs: Secondary | ICD-10-CM | POA: Diagnosis not present

## 2023-09-01 DIAGNOSIS — Z471 Aftercare following joint replacement surgery: Secondary | ICD-10-CM | POA: Diagnosis not present

## 2023-09-01 DIAGNOSIS — Z7901 Long term (current) use of anticoagulants: Secondary | ICD-10-CM | POA: Diagnosis not present

## 2023-09-01 DIAGNOSIS — K219 Gastro-esophageal reflux disease without esophagitis: Secondary | ICD-10-CM | POA: Diagnosis not present

## 2023-09-01 DIAGNOSIS — I10 Essential (primary) hypertension: Secondary | ICD-10-CM | POA: Diagnosis not present

## 2023-09-01 DIAGNOSIS — Z5982 Transportation insecurity: Secondary | ICD-10-CM | POA: Diagnosis not present

## 2023-09-01 DIAGNOSIS — E559 Vitamin D deficiency, unspecified: Secondary | ICD-10-CM | POA: Diagnosis not present

## 2023-09-01 DIAGNOSIS — Z96651 Presence of right artificial knee joint: Secondary | ICD-10-CM | POA: Diagnosis not present

## 2023-09-01 DIAGNOSIS — Z9884 Bariatric surgery status: Secondary | ICD-10-CM | POA: Diagnosis not present

## 2023-09-01 DIAGNOSIS — Z602 Problems related to living alone: Secondary | ICD-10-CM | POA: Diagnosis not present

## 2023-09-01 DIAGNOSIS — Z7951 Long term (current) use of inhaled steroids: Secondary | ICD-10-CM | POA: Diagnosis not present

## 2023-09-01 DIAGNOSIS — G4733 Obstructive sleep apnea (adult) (pediatric): Secondary | ICD-10-CM | POA: Diagnosis not present

## 2023-09-01 DIAGNOSIS — J4489 Other specified chronic obstructive pulmonary disease: Secondary | ICD-10-CM | POA: Diagnosis not present

## 2023-09-01 DIAGNOSIS — E119 Type 2 diabetes mellitus without complications: Secondary | ICD-10-CM | POA: Diagnosis not present

## 2023-09-01 DIAGNOSIS — G5600 Carpal tunnel syndrome, unspecified upper limb: Secondary | ICD-10-CM | POA: Diagnosis not present

## 2023-09-01 DIAGNOSIS — B353 Tinea pedis: Secondary | ICD-10-CM | POA: Diagnosis not present

## 2023-09-02 DIAGNOSIS — M160 Bilateral primary osteoarthritis of hip: Secondary | ICD-10-CM | POA: Diagnosis not present

## 2023-09-02 DIAGNOSIS — E782 Mixed hyperlipidemia: Secondary | ICD-10-CM | POA: Diagnosis not present

## 2023-09-02 DIAGNOSIS — Z96652 Presence of left artificial knee joint: Secondary | ICD-10-CM | POA: Diagnosis not present

## 2023-09-02 DIAGNOSIS — Z7985 Long-term (current) use of injectable non-insulin antidiabetic drugs: Secondary | ICD-10-CM | POA: Diagnosis not present

## 2023-09-02 DIAGNOSIS — Z7901 Long term (current) use of anticoagulants: Secondary | ICD-10-CM | POA: Diagnosis not present

## 2023-09-02 DIAGNOSIS — E119 Type 2 diabetes mellitus without complications: Secondary | ICD-10-CM | POA: Diagnosis not present

## 2023-09-02 DIAGNOSIS — G4733 Obstructive sleep apnea (adult) (pediatric): Secondary | ICD-10-CM | POA: Diagnosis not present

## 2023-09-02 DIAGNOSIS — B351 Tinea unguium: Secondary | ICD-10-CM | POA: Diagnosis not present

## 2023-09-02 DIAGNOSIS — E559 Vitamin D deficiency, unspecified: Secondary | ICD-10-CM | POA: Diagnosis not present

## 2023-09-02 DIAGNOSIS — Z5982 Transportation insecurity: Secondary | ICD-10-CM | POA: Diagnosis not present

## 2023-09-02 DIAGNOSIS — Z96651 Presence of right artificial knee joint: Secondary | ICD-10-CM | POA: Diagnosis not present

## 2023-09-02 DIAGNOSIS — Z7951 Long term (current) use of inhaled steroids: Secondary | ICD-10-CM | POA: Diagnosis not present

## 2023-09-02 DIAGNOSIS — B353 Tinea pedis: Secondary | ICD-10-CM | POA: Diagnosis not present

## 2023-09-02 DIAGNOSIS — K219 Gastro-esophageal reflux disease without esophagitis: Secondary | ICD-10-CM | POA: Diagnosis not present

## 2023-09-02 DIAGNOSIS — I10 Essential (primary) hypertension: Secondary | ICD-10-CM | POA: Diagnosis not present

## 2023-09-02 DIAGNOSIS — Z471 Aftercare following joint replacement surgery: Secondary | ICD-10-CM | POA: Diagnosis not present

## 2023-09-02 DIAGNOSIS — E739 Lactose intolerance, unspecified: Secondary | ICD-10-CM | POA: Diagnosis not present

## 2023-09-02 DIAGNOSIS — Z9884 Bariatric surgery status: Secondary | ICD-10-CM | POA: Diagnosis not present

## 2023-09-02 DIAGNOSIS — J4489 Other specified chronic obstructive pulmonary disease: Secondary | ICD-10-CM | POA: Diagnosis not present

## 2023-09-02 DIAGNOSIS — Z602 Problems related to living alone: Secondary | ICD-10-CM | POA: Diagnosis not present

## 2023-09-02 DIAGNOSIS — G5600 Carpal tunnel syndrome, unspecified upper limb: Secondary | ICD-10-CM | POA: Diagnosis not present

## 2023-09-05 ENCOUNTER — Telehealth: Payer: Self-pay | Admitting: Orthopaedic Surgery

## 2023-09-05 ENCOUNTER — Other Ambulatory Visit: Payer: Self-pay | Admitting: Orthopaedic Surgery

## 2023-09-05 DIAGNOSIS — Z7985 Long-term (current) use of injectable non-insulin antidiabetic drugs: Secondary | ICD-10-CM | POA: Diagnosis not present

## 2023-09-05 DIAGNOSIS — Z602 Problems related to living alone: Secondary | ICD-10-CM | POA: Diagnosis not present

## 2023-09-05 DIAGNOSIS — J4489 Other specified chronic obstructive pulmonary disease: Secondary | ICD-10-CM | POA: Diagnosis not present

## 2023-09-05 DIAGNOSIS — Z96652 Presence of left artificial knee joint: Secondary | ICD-10-CM | POA: Diagnosis not present

## 2023-09-05 DIAGNOSIS — G4733 Obstructive sleep apnea (adult) (pediatric): Secondary | ICD-10-CM | POA: Diagnosis not present

## 2023-09-05 DIAGNOSIS — E119 Type 2 diabetes mellitus without complications: Secondary | ICD-10-CM | POA: Diagnosis not present

## 2023-09-05 DIAGNOSIS — I10 Essential (primary) hypertension: Secondary | ICD-10-CM | POA: Diagnosis not present

## 2023-09-05 DIAGNOSIS — G5600 Carpal tunnel syndrome, unspecified upper limb: Secondary | ICD-10-CM | POA: Diagnosis not present

## 2023-09-05 DIAGNOSIS — Z9884 Bariatric surgery status: Secondary | ICD-10-CM | POA: Diagnosis not present

## 2023-09-05 DIAGNOSIS — B353 Tinea pedis: Secondary | ICD-10-CM | POA: Diagnosis not present

## 2023-09-05 DIAGNOSIS — K219 Gastro-esophageal reflux disease without esophagitis: Secondary | ICD-10-CM | POA: Diagnosis not present

## 2023-09-05 DIAGNOSIS — M160 Bilateral primary osteoarthritis of hip: Secondary | ICD-10-CM | POA: Diagnosis not present

## 2023-09-05 DIAGNOSIS — Z7901 Long term (current) use of anticoagulants: Secondary | ICD-10-CM | POA: Diagnosis not present

## 2023-09-05 DIAGNOSIS — Z471 Aftercare following joint replacement surgery: Secondary | ICD-10-CM | POA: Diagnosis not present

## 2023-09-05 DIAGNOSIS — B351 Tinea unguium: Secondary | ICD-10-CM | POA: Diagnosis not present

## 2023-09-05 DIAGNOSIS — Z7951 Long term (current) use of inhaled steroids: Secondary | ICD-10-CM | POA: Diagnosis not present

## 2023-09-05 DIAGNOSIS — E739 Lactose intolerance, unspecified: Secondary | ICD-10-CM | POA: Diagnosis not present

## 2023-09-05 DIAGNOSIS — Z5982 Transportation insecurity: Secondary | ICD-10-CM | POA: Diagnosis not present

## 2023-09-05 DIAGNOSIS — E782 Mixed hyperlipidemia: Secondary | ICD-10-CM | POA: Diagnosis not present

## 2023-09-05 DIAGNOSIS — Z96651 Presence of right artificial knee joint: Secondary | ICD-10-CM | POA: Diagnosis not present

## 2023-09-05 DIAGNOSIS — E559 Vitamin D deficiency, unspecified: Secondary | ICD-10-CM | POA: Diagnosis not present

## 2023-09-05 MED ORDER — OXYCODONE HCL 5 MG PO TABS
5.0000 mg | ORAL_TABLET | Freq: Four times a day (QID) | ORAL | 0 refills | Status: DC | PRN
Start: 2023-09-05 — End: 2023-09-13

## 2023-09-05 NOTE — Telephone Encounter (Signed)
 Patient called back in and said she just needs a refill on the Oxycodone . CB#[phone]

## 2023-09-05 NOTE — Telephone Encounter (Signed)
 Pt called requesting to send pre auth to pharmacy for her oxycodone . Please send pre [facility]. Pt phone number is [phone].

## 2023-09-05 NOTE — Telephone Encounter (Signed)
 LMOM for patient asking her if this message should've said she was "asking for a refill" not a "pre auth" We haven't sent this in for her since the 12th

## 2023-09-07 DIAGNOSIS — Z96652 Presence of left artificial knee joint: Secondary | ICD-10-CM | POA: Diagnosis not present

## 2023-09-07 DIAGNOSIS — E782 Mixed hyperlipidemia: Secondary | ICD-10-CM | POA: Diagnosis not present

## 2023-09-07 DIAGNOSIS — Z471 Aftercare following joint replacement surgery: Secondary | ICD-10-CM | POA: Diagnosis not present

## 2023-09-07 DIAGNOSIS — B353 Tinea pedis: Secondary | ICD-10-CM | POA: Diagnosis not present

## 2023-09-07 DIAGNOSIS — K219 Gastro-esophageal reflux disease without esophagitis: Secondary | ICD-10-CM | POA: Diagnosis not present

## 2023-09-07 DIAGNOSIS — M160 Bilateral primary osteoarthritis of hip: Secondary | ICD-10-CM | POA: Diagnosis not present

## 2023-09-07 DIAGNOSIS — Z7951 Long term (current) use of inhaled steroids: Secondary | ICD-10-CM | POA: Diagnosis not present

## 2023-09-07 DIAGNOSIS — G4733 Obstructive sleep apnea (adult) (pediatric): Secondary | ICD-10-CM | POA: Diagnosis not present

## 2023-09-07 DIAGNOSIS — Z9884 Bariatric surgery status: Secondary | ICD-10-CM | POA: Diagnosis not present

## 2023-09-07 DIAGNOSIS — Z96651 Presence of right artificial knee joint: Secondary | ICD-10-CM | POA: Diagnosis not present

## 2023-09-07 DIAGNOSIS — E119 Type 2 diabetes mellitus without complications: Secondary | ICD-10-CM | POA: Diagnosis not present

## 2023-09-07 DIAGNOSIS — E559 Vitamin D deficiency, unspecified: Secondary | ICD-10-CM | POA: Diagnosis not present

## 2023-09-07 DIAGNOSIS — I10 Essential (primary) hypertension: Secondary | ICD-10-CM | POA: Diagnosis not present

## 2023-09-07 DIAGNOSIS — Z5982 Transportation insecurity: Secondary | ICD-10-CM | POA: Diagnosis not present

## 2023-09-07 DIAGNOSIS — E739 Lactose intolerance, unspecified: Secondary | ICD-10-CM | POA: Diagnosis not present

## 2023-09-07 DIAGNOSIS — J4489 Other specified chronic obstructive pulmonary disease: Secondary | ICD-10-CM | POA: Diagnosis not present

## 2023-09-07 DIAGNOSIS — Z7901 Long term (current) use of anticoagulants: Secondary | ICD-10-CM | POA: Diagnosis not present

## 2023-09-07 DIAGNOSIS — G5600 Carpal tunnel syndrome, unspecified upper limb: Secondary | ICD-10-CM | POA: Diagnosis not present

## 2023-09-07 DIAGNOSIS — B351 Tinea unguium: Secondary | ICD-10-CM | POA: Diagnosis not present

## 2023-09-07 DIAGNOSIS — Z602 Problems related to living alone: Secondary | ICD-10-CM | POA: Diagnosis not present

## 2023-09-07 DIAGNOSIS — Z7985 Long-term (current) use of injectable non-insulin antidiabetic drugs: Secondary | ICD-10-CM | POA: Diagnosis not present

## 2023-09-08 ENCOUNTER — Encounter: Payer: Self-pay | Admitting: Physical Therapy

## 2023-09-08 ENCOUNTER — Ambulatory Visit (INDEPENDENT_AMBULATORY_CARE_PROVIDER_SITE_OTHER): Admitting: Physical Therapy

## 2023-09-08 ENCOUNTER — Ambulatory Visit (INDEPENDENT_AMBULATORY_CARE_PROVIDER_SITE_OTHER): Admitting: Orthopaedic Surgery

## 2023-09-08 ENCOUNTER — Encounter: Payer: Self-pay | Admitting: Orthopaedic Surgery

## 2023-09-08 DIAGNOSIS — M25561 Pain in right knee: Secondary | ICD-10-CM

## 2023-09-08 DIAGNOSIS — R600 Localized edema: Secondary | ICD-10-CM | POA: Diagnosis not present

## 2023-09-08 DIAGNOSIS — R2681 Unsteadiness on feet: Secondary | ICD-10-CM

## 2023-09-08 DIAGNOSIS — R2689 Other abnormalities of gait and mobility: Secondary | ICD-10-CM | POA: Diagnosis not present

## 2023-09-08 DIAGNOSIS — Z96651 Presence of right artificial knee joint: Secondary | ICD-10-CM

## 2023-09-08 DIAGNOSIS — M25661 Stiffness of right knee, not elsewhere classified: Secondary | ICD-10-CM | POA: Diagnosis not present

## 2023-09-08 NOTE — Therapy (Signed)
 OUTPATIENT PHYSICAL THERAPY EVALUATION   Patient Name: Leah Hampton MRN: [id number] DOB:[birth year], 68 y.o., female Today's Date: 09/08/2023  END OF SESSION:  PT End of Session - 09/08/23 1531     Visit Number 1    Number of Visits 24    Date for PT Re-Evaluation 11/03/23    Authorization Type U[employer] Medicare Dual Complete    Progress Note Due on Visit 10    PT Start Time 1500    PT Stop Time 1530    PT Time Calculation (min) 30 min    Activity Tolerance Patient tolerated treatment well    Behavior During Therapy [facility] for tasks assessed/performed             Past Medical History:  Diagnosis Date   Bronchitis    Chronic bronchitis (HCC)    Cystitis    Hematuria    urologice eval, Dr Clifton Dames   History of chest pain    Hyperbilirubinemia    Hyperlipemia    Hypertelorism    Hypertension    Leukocytosis    Obesity    Osteoarthritis    right knee   Pre-diabetes    Sleep apnea 2012   slight   no CPAP   Vaginal Pap smear, abnormal    Vitamin D  deficiency 02/09/2018   Past Surgical History:  Procedure Laterality Date   CERVICAL POLYPECTOMY  07/15/2020   Procedure: POLYPECTOMY with Myosure;  Surgeon: Lenord Radon, MD;  Location: [facility] OR;  Service: Gynecology;;   COLONOSCOPY WITH PROPOFOL  N/A 03/27/2015   Procedure: COLONOSCOPY WITH PROPOFOL ;  Surgeon: Janel Medford, MD;  Location: [facility]COPY;  Service: Endoscopy;  Laterality: N/A;   COLONOSCOPY WITH PROPOFOL  N/A 10/02/2020   Procedure: COLONOSCOPY WITH PROPOFOL ;  Surgeon: Janel Medford, MD;  Location: [facility];  Service: Endoscopy;  Laterality: N/A;   COLPOSCOPY N/A 07/15/2020   Procedure: COLPOSCOPY;  Surgeon: Lenord Radon, MD;  Location: [facility] OR;  Service: Gynecology;  Laterality: N/A;   DILATATION & CURETTAGE/HYSTEROSCOPY WITH MYOSURE N/A 01/05/2017   Procedure: DILATATION & CURETTAGE/HYSTEROSCOPY WITH MYOSURE;  Surgeon: Johnn Najjar, MD;  Location: [facility] ORS;  Service: Gynecology;   Laterality: N/A;  PostMenopausal Bleeding   HYSTEROSCOPY WITH D & C N/A 05/22/2019   Procedure: DILATATION AND CURETTAGE /HYSTEROSCOPY, POLYPECTOMY WITH MYOSURE;  Surgeon: Lenord Radon, MD;  Location: [facility] ORS;  Service: Gynecology;  Laterality: N/A;   HYSTEROSCOPY WITH D & C N/A 07/15/2020   Procedure: DILATATION AND CURETTAGE /HYSTEROSCOPY;  Surgeon: Lenord Radon, MD;  Location: [facility] OR;  Service: Gynecology;  Laterality: N/A;   KNEE ARTHROSCOPY  04/2007   left knee   LAPAROSCOPIC GASTRIC SLEEVE RESECTION     TOTAL KNEE ARTHROPLASTY Left 10/2008   Dr Franz Jacks   TOTAL KNEE ARTHROPLASTY Right 08/26/2023   Procedure: ARTHROPLASTY, KNEE, TOTAL;  Surgeon: Arnie Lao, MD;  Location: [facility];  Service: Orthopedics;  Laterality: Right;   UPPER GI ENDOSCOPY N/A 10/14/2020   Procedure: UPPER GI ENDOSCOPY;  Surgeon: Aldean Hummingbird, MD;  Location: [facility] ORS;  Service: General;  Laterality: N/A;   Patient Active Problem List   Diagnosis Date Noted   Status post total right knee replacement 08/26/2023   Sleep disturbance 01/21/2023   Lactose intolerance 10/22/2022   S/P gastric sleeve procedure 11/04/2020   Uncomplicated asthma 11/04/2020   Gastroesophageal reflux disease 11/04/2020   Primary osteoarthritis of both hips 09/30/2020   History of total left knee replacement 07/29/2020   Endometrial polyp 07/15/2020   Low grade  squamous intraepithelial lesion (LGSIL) on cervical Pap smear 07/07/2020   Upper abdominal pain 08/20/2019   Right hip pain 05/28/2019   Post-menopausal bleeding 05/16/2019   Greater trochanteric pain syndrome of left lower extremity 03/20/2019   Type 2 diabetes mellitus without complication, without long-term current use of insulin  (HCC) 05/18/2018   Vitamin D  deficiency 02/09/2018   Onychomycosis 09/19/2017   Tinea pedis of both feet 09/19/2017   Obstructive chronic bronchitis with exacerbation (HCC) 05/23/2017   Mixed hyperlipidemia 05/01/2014    Genital herpes 05/23/2012   Stress incontinence 03/04/2012   Unilateral primary osteoarthritis, right knee 10/12/2011   CTS (carpal tunnel syndrome) 07/13/2011   OSA (obstructive sleep apnea) 12/10/2010   Severe obesity (BMI >= 40) (HCC) 07/31/2008   Essential hypertension 07/03/2008    PCP: Dorrene Gaucher, NP  REFERRING PROVIDER: Arnie Lao, MD  REFERRING DIAG: M17.11 (ICD-10-CM) - Unilateral primary osteoarthritis, right knee  Rationale for Evaluation and Treatment: Rehabilitation  THERAPY DIAG:  Acute pain of right knee - Plan: PT plan of care cert/re-cert  Stiffness of right knee, not elsewhere classified - Plan: PT plan of care cert/re-cert  Other abnormalities of gait and mobility - Plan: PT plan of care cert/re-cert  Localized edema - Plan: PT plan of care cert/re-cert  Unsteadiness on feet - Plan: PT plan of care cert/re-cert  ONSET DATE: 08/26/23 (DOS)   SUBJECTIVE:                                                                                                                                                                                           SUBJECTIVE STATEMENT: Pt is s/p Rt TKA on 08/26/23. She finished HHPT on 09/07/23. She was taking Xarelto  TID instead of every day.  PERTINENT HISTORY:  besity, HTN, HLD, DM, OA, Lt TKA in 2010, s/p gastric sleeve resection 2022  PAIN:  Are you having pain? Yes: NPRS scale: 7 currently, up to 8, at best 6/10 Pain location: Rt knee Pain description: sharp, shooting, aching, tightness Aggravating factors: bending her knee, standing. walking  Relieving factors: medication, ice  PRECAUTIONS:  None  RED FLAGS: None   WEIGHT BEARING RESTRICTIONS:  No  FALLS:  Has patient fallen in last 6 months? No  LIVING ENVIRONMENT: Lives with: lives alone Lives in: House/apartment Stairs: Yes: External: 16 steps; can reach both Has following equipment at home: Walker - 4 wheeled  OCCUPATION:  On  disability  PLOF:  Independent and Leisure: watching TV, shopping, has a fitness center at her apartment complex  PATIENT GOALS:  Improve function in knee, get back to gym   OBJECTIVE:  Note: Objective measures  were completed at Evaluation unless otherwise noted.  PATIENT SURVEYS:  Patient-Specific Activity Scoring Scheme  "0" represents "unable to perform." "10" represents "able to perform at prior level. 0 1 2 3 4 5 6 7 8 9  10 (Date and Score)   Activity Eval     1. Walking 2    2. Bending the knee 2    3. Extending the knee 2   Score 2    Total score = sum of the activity scores/number of activities Minimum detectable change (90%CI) for average score = 2 points Minimum detectable change (90%CI) for single activity score = 3 points    COGNITIVE STATUS: Within functional limits for tasks assessed   SENSATION: WFL    GAIT: 09/08/23 Distance walked: 150' within clinic Assistive device utilized: Environmental consultant - 4 wheeled Level of assistance: Modified independence Comments: decreased stance on Rt, decr hip/knee flexion on Rt   EDEMA: 09/08/23  Knee joint line: Rt: 71 cm/Lt: 48.5 cm   LOWER EXTREMITY ROM:     ROM Right eval Left eval  Knee flexion A: 21 P: 41 A: 108  Knee extension A: 8 (seated LAQ) P: 0 A: 0   (Blank rows = not tested)   LOWER EXTREMITY MMT:    Not formally tested at eval; Rt knee grossly 3-/5  MMT Right eval Left eval  Hip flexion    Hip extension    Hip abduction    Hip adduction    Hip internal rotation    Hip external rotation    Knee flexion    Knee extension    Ankle dorsiflexion    Ankle plantarflexion    Ankle inversion    Ankle eversion     (Blank rows = not tested)     TREATMENT:                                                                                                                              DATE:  09/08/23 TherEx See HEP - demonstrated with trial reps performed PRN, mod cues for comprehension     Self Care Educated on POC and clinical findings; assisted with donning compression sock and educated on edema management strategies     PATIENT EDUCATION:  Education details: HEP Person educated: Patient Education method: Programmer, multimedia, Facilities manager, and Handouts Education comprehension: verbalized understanding, returned demonstration, and needs further education  HOME EXERCISE PROGRAM: Access Code: [email or web address] URL: [email or web address] Date: 09/08/2023 Prepared by: Casimer Clear  Exercises - Ankle Pumps in Elevation  - 10 x daily - 7 x weekly - 1 sets - 10 reps - Ankle Alphabet in Elevation  - 10 x daily - 7 x weekly - 1 sets - 10 reps - Quad Set  - 5-10 x daily - 7 x weekly - 1 sets - 5-10 reps - 5 sec hold - Seated Knee Flexion Extension AROM   - 5-10 x daily - 7 x weekly - 1 sets - 5-10  reps - 5 sec  hold   ASSESSMENT:  CLINICAL IMPRESSION: Patient is a 68 y.o. female who was seen today for physical therapy evaluation and treatment for Rt TKA. She demonstrates decreased strength, ROM and balance as well as gait abnormalities and post op pain and significant swelling affecting functional mobility.  She will benefit from PT to address deficits listed.     OBJECTIVE IMPAIRMENTS: Abnormal gait, decreased balance, decreased endurance, decreased knowledge of use of DME, decreased mobility, difficulty walking, decreased ROM, decreased strength, hypomobility, increased edema, increased fascial restrictions, increased muscle spasms, and pain.   ACTIVITY LIMITATIONS: carrying, lifting, sitting, standing, squatting, sleeping, stairs, transfers, bed mobility, and locomotion level  PARTICIPATION LIMITATIONS: meal prep, cleaning, laundry, driving, shopping, and community activity  PERSONAL FACTORS: 3+ comorbidities: obesity, HTN, HLD, DM, OA, Lt TKA in 2010, s/p gastric sleeve resection 2022  are also affecting patient's functional outcome.   REHAB POTENTIAL:  Good  CLINICAL DECISION MAKING: Evolving/moderate complexity  EVALUATION COMPLEXITY: Moderate   GOALS: Goals reviewed with patient? Yes  SHORT TERM GOALS: Target date: 10/06/2023  Independent with initial HEP Goal status: INITIAL  2.  Rt knee ROM improved to 0-90 for improved mobility Goal status: INITIAL   LONG TERM GOALS: Target date: 11/03/2023  Independent with final HEP Goal status: INITIAL  2.  PSFS score improved by 3 points Goal status: INITIAL  3.  Rt knee AROM improved to 0-100 for improved function and mobility Goal status: INIITAL  4.  Report pain < 4/10 with standing and walking for improved function Goal status: INITIAL  5.  Amb without AD without significant deviations for improved community access Goal status: INITIAL    PLAN:  PT FREQUENCY:  2-3x/wk  PT DURATION: 8 weeks  PLANNED INTERVENTIONS: 97164- PT Re-evaluation, 97750- Physical Performance Testing, 97110-Therapeutic exercises, 97530- Therapeutic activity, [id number]- Neuromuscular re-education, 97535- Self Care, [id number]- Manual therapy, [phone]- Gait training, [phone]- Aquatic Therapy, [phone]- Electrical stimulation (unattended), 97016- Vasopneumatic device, Patient/Family education, Balance training, Stair training, Taping, Dry Needling, Joint mobilization, DME instructions, Cryotherapy, and Moist heat.  PLAN FOR NEXT SESSION: Review HEP, edema management strategies, work on ROM as able   NEXT MD VISIT: 09/14/23   Marley Simmers, PT, DPT 09/08/23 3:37 PM

## 2023-09-08 NOTE — Progress Notes (Signed)
 The patient is here for first postoperative visit status post a right total knee replacement.  She is a morbidly obese individual that we did replace her right knee secondary to severe arthritis.  We were concerned about potential for blood clots after surgery as well and we have her on Xarelto .  With that being said she has had bloody drainage from her incision.  The incision today does show some bloody drainage but there is no evidence of infection her calf is soft.  We were not comfortable with removing staples today and we placed a new Aquacel dressing.  I am fine with her having a visit with physical therapy this afternoon but to really get slow.  She will stop the Xarelto .  We will see her back Wednesday next week for staple removal.  All question concerns were answered addressed.  She can get wet in the shower as well.

## 2023-09-09 ENCOUNTER — Ambulatory Visit: Admitting: Physical Therapy

## 2023-09-12 ENCOUNTER — Encounter: Payer: Self-pay | Admitting: Physical Therapy

## 2023-09-12 ENCOUNTER — Telehealth: Payer: Self-pay | Admitting: Family

## 2023-09-12 ENCOUNTER — Ambulatory Visit (INDEPENDENT_AMBULATORY_CARE_PROVIDER_SITE_OTHER): Admitting: Physical Therapy

## 2023-09-12 DIAGNOSIS — R2681 Unsteadiness on feet: Secondary | ICD-10-CM

## 2023-09-12 DIAGNOSIS — M25661 Stiffness of right knee, not elsewhere classified: Secondary | ICD-10-CM

## 2023-09-12 DIAGNOSIS — M25561 Pain in right knee: Secondary | ICD-10-CM | POA: Diagnosis not present

## 2023-09-12 DIAGNOSIS — R2689 Other abnormalities of gait and mobility: Secondary | ICD-10-CM

## 2023-09-12 DIAGNOSIS — R600 Localized edema: Secondary | ICD-10-CM

## 2023-09-12 NOTE — Therapy (Signed)
 OUTPATIENT PHYSICAL THERAPY TREATMENT   Patient Name: Leah Hampton MRN: [id number] DOB:September 03, 1955, 68 y.o., female Today's Date: 09/12/2023  END OF SESSION:  PT End of Session - 09/12/23 1300     Visit Number 2    Number of Visits 24    Date for PT Re-Evaluation 11/03/23    Authorization Type UHC Medicare Dual Complete    Progress Note Due on Visit 10    PT Start Time 1300    PT Stop Time 1350    PT Time Calculation (min) 50 min    Activity Tolerance Patient tolerated treatment well    Behavior During Therapy [facility] for tasks assessed/performed              Past Medical History:  Diagnosis Date   Bronchitis    Chronic bronchitis (HCC)    Cystitis    Hematuria    urologice eval, Dr Clifton Dames   History of chest pain    Hyperbilirubinemia    Hyperlipemia    Hypertelorism    Hypertension    Leukocytosis    Obesity    Osteoarthritis    right knee   Pre-diabetes    Sleep apnea 2012   slight   no CPAP   Vaginal Pap smear, abnormal    Vitamin D  deficiency 02/09/2018   Past Surgical History:  Procedure Laterality Date   CERVICAL POLYPECTOMY  07/15/2020   Procedure: POLYPECTOMY with Myosure;  Surgeon: Lenord Radon, MD;  Location: [facility] OR;  Service: Gynecology;;   COLONOSCOPY WITH PROPOFOL  N/A 03/27/2015   Procedure: COLONOSCOPY WITH PROPOFOL ;  Surgeon: Janel Medford, MD;  Location: [facility];  Service: Endoscopy;  Laterality: N/A;   COLONOSCOPY WITH PROPOFOL  N/A 10/02/2020   Procedure: COLONOSCOPY WITH PROPOFOL ;  Surgeon: Janel Medford, MD;  Location: [facility];  Service: Endoscopy;  Laterality: N/A;   COLPOSCOPY N/A 07/15/2020   Procedure: COLPOSCOPY;  Surgeon: Lenord Radon, MD;  Location: [facility] OR;  Service: Gynecology;  Laterality: N/A;   DILATATION & CURETTAGE/HYSTEROSCOPY WITH MYOSURE N/A 01/05/2017   Procedure: DILATATION & CURETTAGE/HYSTEROSCOPY WITH MYOSURE;  Surgeon: Johnn Najjar, MD;  Location: [facility] ORS;  Service: Gynecology;   Laterality: N/A;  PostMenopausal Bleeding   HYSTEROSCOPY WITH D & C N/A 05/22/2019   Procedure: DILATATION AND CURETTAGE /HYSTEROSCOPY, POLYPECTOMY WITH MYOSURE;  Surgeon: Lenord Radon, MD;  Location: [facility];  Service: Gynecology;  Laterality: N/A;   HYSTEROSCOPY WITH D & C N/A 07/15/2020   Procedure: DILATATION AND CURETTAGE /HYSTEROSCOPY;  Surgeon: Lenord Radon, MD;  Location: [facility] OR;  Service: Gynecology;  Laterality: N/A;   KNEE ARTHROSCOPY  04/2007   left knee   LAPAROSCOPIC GASTRIC SLEEVE RESECTION     TOTAL KNEE ARTHROPLASTY Left 10/2008   Dr Franz Jacks   TOTAL KNEE ARTHROPLASTY Right 08/26/2023   Procedure: ARTHROPLASTY, KNEE, TOTAL;  Surgeon: Arnie Lao, MD;  Location: [facility];  Service: Orthopedics;  Laterality: Right;   UPPER GI ENDOSCOPY N/A 10/14/2020   Procedure: UPPER GI ENDOSCOPY;  Surgeon: Aldean Hummingbird, MD;  Location: [facility];  Service: General;  Laterality: N/A;   Patient Active Problem List   Diagnosis Date Noted   Status post total right knee replacement 08/26/2023   Sleep disturbance 01/21/2023   Lactose intolerance 10/22/2022   S/P gastric sleeve procedure 11/04/2020   Uncomplicated asthma 11/04/2020   Gastroesophageal reflux disease 11/04/2020   Primary osteoarthritis of both hips 09/30/2020   History of total left knee replacement 07/29/2020   Endometrial polyp 07/15/2020   Low  grade squamous intraepithelial lesion (LGSIL) on cervical Pap smear 07/07/2020   Upper abdominal pain 08/20/2019   Right hip pain 05/28/2019   Post-menopausal bleeding 05/16/2019   Greater trochanteric pain syndrome of left lower extremity 03/20/2019   Type 2 diabetes mellitus without complication, without long-term current use of insulin  (HCC) 05/18/2018   Vitamin D  deficiency 02/09/2018   Onychomycosis 09/19/2017   Tinea pedis of both feet 09/19/2017   Obstructive chronic bronchitis with exacerbation (HCC) 05/23/2017   Mixed hyperlipidemia 05/01/2014    Genital herpes 05/23/2012   Stress incontinence 03/04/2012   Unilateral primary osteoarthritis, right knee 10/12/2011   CTS (carpal tunnel syndrome) 07/13/2011   OSA (obstructive sleep apnea) 12/10/2010   Severe obesity (BMI >= 40) (HCC) 07/31/2008   Essential hypertension 07/03/2008    PCP: Dorrene Gaucher, NP  REFERRING PROVIDER: Arnie Lao, MD  REFERRING DIAG: M17.11 (ICD-10-CM) - Unilateral primary osteoarthritis, right knee  Rationale for Evaluation and Treatment: Rehabilitation  THERAPY DIAG:  Acute pain of right knee  Stiffness of right knee, not elsewhere classified  Other abnormalities of gait and mobility  Localized edema  Unsteadiness on feet  ONSET DATE: 08/26/23 (DOS)   SUBJECTIVE:                                                                                                                                                                                           SUBJECTIVE STATEMENT: Pain is okay today; has been having some abdominal pain so awaiting to hear from the doctor  PERTINENT HISTORY:  besity, HTN, HLD, DM, OA, Lt TKA in 2010, s/p gastric sleeve resection 2022  PAIN:  Are you having pain? Yes: NPRS scale: 7 currently, up to 8, at best 6/10 Pain location: Rt knee Pain description: sharp, shooting, aching, tightness Aggravating factors: bending her knee, standing. walking  Relieving factors: medication, ice  PRECAUTIONS:  None  RED FLAGS: None   WEIGHT BEARING RESTRICTIONS:  No  FALLS:  Has patient fallen in last 6 months? No  LIVING ENVIRONMENT: Lives with: lives alone Lives in: House/apartment Stairs: Yes: External: 16 steps; can reach both Has following equipment at home: Walker - 4 wheeled  OCCUPATION:  On disability  PLOF:  Independent and Leisure: watching TV, shopping, has a fitness center at her apartment complex  PATIENT GOALS:  Improve function in knee, get back to gym   OBJECTIVE:  Note:  Objective measures were completed at Evaluation unless otherwise noted.  PATIENT SURVEYS:  Patient-Specific Activity Scoring Scheme  "0" represents "unable to perform." "10" represents "able to perform at prior level. 0 1 2 3 4 5 6 7  8  9 10 (Date and Score)   Activity Eval     1. Walking 2    2. Bending the knee 2    3. Extending the knee 2   Score 2    Total score = sum of the activity scores/number of activities Minimum detectable change (90%CI) for average score = 2 points Minimum detectable change (90%CI) for single activity score = 3 points    COGNITIVE STATUS: Within functional limits for tasks assessed   SENSATION: WFL    GAIT: 09/08/23 Distance walked: 150' within clinic Assistive device utilized: Environmental consultant - 4 wheeled Level of assistance: Modified independence Comments: decreased stance on Rt, decr hip/knee flexion on Rt   EDEMA: 09/08/23  Knee joint line: Rt: 71 cm/Lt: 48.5 cm   LOWER EXTREMITY ROM:     ROM Right eval Left eval  Knee flexion A: 21 P: 41 A: 108  Knee extension A: 8 (seated LAQ) P: 0 A: 0   (Blank rows = not tested)   LOWER EXTREMITY MMT:    Not formally tested at eval; Rt knee grossly 3-/5  MMT Right eval Left eval  Hip flexion    Hip extension    Hip abduction    Hip adduction    Hip internal rotation    Hip external rotation    Knee flexion    Knee extension    Ankle dorsiflexion    Ankle plantarflexion    Ankle inversion    Ankle eversion     (Blank rows = not tested)     TREATMENT:                                                                                                                              DATE:  09/12/23 TherEx Seated tailgate flexion x 2 min RLE LAQ 2x10 with 3 sec hold NuStep x 10 min working on flexion with holds at end range; seat 8; L6  TherAct Seated AA knee flexion 10 x 10 sec hold; LLE providing RLE overpressure  Manual Seated Rt knee flexion with cues needed to decreased guarding  and compensation  Modalities Vaso x 10 min, mod pressure to Rt knee and ankle; 34 deg in elevation   09/08/23 TherEx See HEP - demonstrated with trial reps performed PRN, mod cues for comprehension    Self Care Educated on POC and clinical findings; assisted with donning compression sock and educated on edema management strategies     PATIENT EDUCATION:  Education details: HEP Person educated: Patient Education method: Programmer, multimedia, Facilities manager, and Handouts Education comprehension: verbalized understanding, returned demonstration, and needs further education  HOME EXERCISE PROGRAM: Access Code: [email or web address] URL: [email or web address] Date: 09/08/2023 Prepared by: Casimer Clear  Exercises - Ankle Pumps in Elevation  - 10 x daily - 7 x weekly - 1 sets - 10 reps - Ankle Alphabet in Elevation  - 10 x daily - 7 x weekly - 1 sets - 10 reps - Quad Set  -  5-10 x daily - 7 x weekly - 1 sets - 5-10 reps - 5 sec hold - Seated Knee Flexion Extension AROM   - 5-10 x daily - 7 x weekly - 1 sets - 5-10 reps - 5 sec  hold   ASSESSMENT:  CLINICAL IMPRESSION: Visual assessment of RLE indicating improved swelling but still with significant swelling in RLE.  She overall tolerated session well today and will continue to benefit from PT to maximize function.     OBJECTIVE IMPAIRMENTS: Abnormal gait, decreased balance, decreased endurance, decreased knowledge of use of DME, decreased mobility, difficulty walking, decreased ROM, decreased strength, hypomobility, increased edema, increased fascial restrictions, increased muscle spasms, and pain.   ACTIVITY LIMITATIONS: carrying, lifting, sitting, standing, squatting, sleeping, stairs, transfers, bed mobility, and locomotion level  PARTICIPATION LIMITATIONS: meal prep, cleaning, laundry, driving, shopping, and community activity  PERSONAL FACTORS: 3+ comorbidities: obesity, HTN, HLD, DM, OA, Lt TKA in 2010, s/p gastric sleeve  resection 2022  are also affecting patient's functional outcome.   REHAB POTENTIAL: Good  CLINICAL DECISION MAKING: Evolving/moderate complexity  EVALUATION COMPLEXITY: Moderate   GOALS: Goals reviewed with patient? Yes  SHORT TERM GOALS: Target date: 10/06/2023  Independent with initial HEP Goal status: INITIAL  2.  Rt knee ROM improved to 0-90 for improved mobility Goal status: INITIAL   LONG TERM GOALS: Target date: 11/03/2023  Independent with final HEP Goal status: INITIAL  2.  PSFS score improved by 3 points Goal status: INITIAL  3.  Rt knee AROM improved to 0-100 for improved function and mobility Goal status: INIITAL  4.  Report pain < 4/10 with standing and walking for improved function Goal status: INITIAL  5.  Amb without AD without significant deviations for improved community access Goal status: INITIAL    PLAN:  PT FREQUENCY:  2-3x/wk  PT DURATION: 8 weeks  PLANNED INTERVENTIONS: 97164- PT Re-evaluation, 97750- Physical Performance Testing, 97110-Therapeutic exercises, 97530- Therapeutic activity, [id number]- Neuromuscular re-education, 97535- Self Care, [id number]- Manual therapy, [phone]- Gait training, [phone]- Aquatic Therapy, [phone]- Electrical stimulation (unattended), 97016- Vasopneumatic device, Patient/Family education, Balance training, Stair training, Taping, Dry Needling, Joint mobilization, DME instructions, Cryotherapy, and Moist heat.  PLAN FOR NEXT SESSION:  Review HEP PRN, edema management strategies, work on ROM as able   NEXT MD VISIT: 09/14/23   Marley Simmers, PT, DPT 09/12/23 1:44 PM

## 2023-09-12 NOTE — Telephone Encounter (Signed)
 I spoke to the patient and she told me that she has not taken her xarelto  since last Thursday. Dr. Lucienne Ryder told her ok to stop xarelto . She denies any symptoms of bleeding.  Dr Elvan Hamel- Arlie Lain. Thanks.

## 2023-09-12 NOTE — Telephone Encounter (Signed)
-----   Message from Aldean Hummingbird sent at 09/12/2023 11:30 AM EDT ----- Regarding: RE: increase hernia pain I would say her PCP team needs to potentially evaluate her if she truly has been taking her xarelto  three times a day since 4/11.    If she is tolerating eating without n/v and having BMs unlikely her ventral hernia is obstructed.    I have cc'd her PCP on this message. But sounds like she may need labs and a visit with a provider just to make sure everything is ok since she has been taking xarelto  tid.   A ct a/p can always be ordered if there is a concern.  Let's see if Ms Leah Hampton is able to see this message over the next few hours.  Thanks w ----- Message ----- From: [name] Sent: 09/12/2023   9:18 AM EDT To: Aldean Hummingbird, MD; Ola Berger Subject: increase hernia pain                           Pt calling c/o increased pain around her hernia. Pt had total knee replacement on 4/11. She states they sent her home with robaxin  and oxycodone  to take several times daily. Pt concern is that she mixed up her medication and took her Xarelto  3 times daily since her surgery on 4/11. She thinks that it has caused the increased pain to her hernia.   Pt pain 8/10. Pt denies n/v, fever. No problems with BM or urinating.   Please advise.

## 2023-09-13 ENCOUNTER — Other Ambulatory Visit: Payer: Self-pay | Admitting: Orthopaedic Surgery

## 2023-09-13 ENCOUNTER — Encounter: Payer: Self-pay | Admitting: Physical Therapy

## 2023-09-13 ENCOUNTER — Ambulatory Visit (INDEPENDENT_AMBULATORY_CARE_PROVIDER_SITE_OTHER): Admitting: Physical Therapy

## 2023-09-13 DIAGNOSIS — R2689 Other abnormalities of gait and mobility: Secondary | ICD-10-CM

## 2023-09-13 DIAGNOSIS — R2681 Unsteadiness on feet: Secondary | ICD-10-CM

## 2023-09-13 DIAGNOSIS — R600 Localized edema: Secondary | ICD-10-CM

## 2023-09-13 DIAGNOSIS — M25661 Stiffness of right knee, not elsewhere classified: Secondary | ICD-10-CM | POA: Diagnosis not present

## 2023-09-13 DIAGNOSIS — M25561 Pain in right knee: Secondary | ICD-10-CM | POA: Diagnosis not present

## 2023-09-13 MED ORDER — OXYCODONE HCL 5 MG PO TABS
5.0000 mg | ORAL_TABLET | Freq: Four times a day (QID) | ORAL | 0 refills | Status: DC | PRN
Start: 2023-09-13 — End: 2023-09-21

## 2023-09-13 MED ORDER — METHOCARBAMOL 500 MG PO TABS: 500.0000 mg | ORAL_TABLET | Freq: Four times a day (QID) | ORAL | 1 refills | Status: DC | PRN

## 2023-09-13 NOTE — Telephone Encounter (Signed)
 Pt is here wanting to see if she can get a refill on her methocarbamol  (ROBAXIN )     500 MGand oxyCODONE   (OXY   IR/ROXICODONE ) 5MG .  Pt state that she is out of medications.                                  Pharm: [facility] on [address].

## 2023-09-13 NOTE — Therapy (Signed)
 OUTPATIENT PHYSICAL THERAPY TREATMENT   Patient Name: Leah Hampton MRN: [id number] DOB:[birth year], 68 y.o., female Today's Date: 09/13/2023  END OF SESSION:  PT End of Session - 09/13/23 0927     Visit Number 3    Number of Visits 24    Date for PT Re-Evaluation 11/03/23    Authorization Type [employer] Medicare Dual Complete    Progress Note Due on Visit 10    PT Start Time 0845    PT Stop Time 0935    PT Time Calculation (min) 50 min    Activity Tolerance Patient tolerated treatment well;Patient limited by pain    Behavior During Therapy [facility] for tasks assessed/performed               Past Medical History:  Diagnosis Date   Bronchitis    Chronic bronchitis (HCC)    Cystitis    Hematuria    urologice eval, Dr Clifton Dames   History of chest pain    Hyperbilirubinemia    Hyperlipemia    Hypertelorism    Hypertension    Leukocytosis    Obesity    Osteoarthritis    right knee   Pre-diabetes    Sleep apnea 2012   slight   no CPAP   Vaginal Pap smear, abnormal    Vitamin D  deficiency 02/09/2018   Past Surgical History:  Procedure Laterality Date   CERVICAL POLYPECTOMY  07/15/2020   Procedure: POLYPECTOMY with Myosure;  Surgeon: Lenord Radon, MD;  Location: [facility] OR;  Service: Gynecology;;   COLONOSCOPY WITH PROPOFOL  N/A 03/27/2015   Procedure: COLONOSCOPY WITH PROPOFOL ;  Surgeon: Janel Medford, MD;  Location: [facility];  Service: Endoscopy;  Laterality: N/A;   COLONOSCOPY WITH PROPOFOL  N/A 10/02/2020   Procedure: COLONOSCOPY WITH PROPOFOL ;  Surgeon: Janel Medford, MD;  Location: [facility];  Service: Endoscopy;  Laterality: N/A;   COLPOSCOPY N/A 07/15/2020   Procedure: COLPOSCOPY;  Surgeon: Lenord Radon, MD;  Location: [facility] OR;  Service: Gynecology;  Laterality: N/A;   DILATATION & CURETTAGE/HYSTEROSCOPY WITH MYOSURE N/A 01/05/2017   Procedure: DILATATION & CURETTAGE/HYSTEROSCOPY WITH MYOSURE;  Surgeon: Johnn Najjar, MD;  Location: [facility] ORS;   Service: Gynecology;  Laterality: N/A;  PostMenopausal Bleeding   HYSTEROSCOPY WITH D & C N/A 05/22/2019   Procedure: DILATATION AND CURETTAGE /HYSTEROSCOPY, POLYPECTOMY WITH MYOSURE;  Surgeon: Lenord Radon, MD;  Location: [facility] ORS;  Service: Gynecology;  Laterality: N/A;   HYSTEROSCOPY WITH D & C N/A 07/15/2020   Procedure: DILATATION AND CURETTAGE /HYSTEROSCOPY;  Surgeon: Lenord Radon, MD;  Location: [facility] OR;  Service: Gynecology;  Laterality: N/A;   KNEE ARTHROSCOPY  04/2007   left knee   LAPAROSCOPIC GASTRIC SLEEVE RESECTION     TOTAL KNEE ARTHROPLASTY Left 10/2008   Dr Franz Jacks   TOTAL KNEE ARTHROPLASTY Right 08/26/2023   Procedure: ARTHROPLASTY, KNEE, TOTAL;  Surgeon: Arnie Lao, MD;  Location: [facility];  Service: Orthopedics;  Laterality: Right;   UPPER GI ENDOSCOPY N/A 10/14/2020   Procedure: UPPER GI ENDOSCOPY;  Surgeon: Aldean Hummingbird, MD;  Location: [facility] ORS;  Service: General;  Laterality: N/A;   Patient Active Problem List   Diagnosis Date Noted   Status post total right knee replacement 08/26/2023   Sleep disturbance 01/21/2023   Lactose intolerance 10/22/2022   S/P gastric sleeve procedure 11/04/2020   Uncomplicated asthma 11/04/2020   Gastroesophageal reflux disease 11/04/2020   Primary osteoarthritis of both hips 09/30/2020   History of total left knee replacement 07/29/2020   Endometrial polyp  07/15/2020   Low grade squamous intraepithelial lesion (LGSIL) on cervical Pap smear 07/07/2020   Upper abdominal pain 08/20/2019   Right hip pain 05/28/2019   Post-menopausal bleeding 05/16/2019   Greater trochanteric pain syndrome of left lower extremity 03/20/2019   Type 2 diabetes mellitus without complication, without long-term current use of insulin  (HCC) 05/18/2018   Vitamin D  deficiency 02/09/2018   Onychomycosis 09/19/2017   Tinea pedis of both feet 09/19/2017   Obstructive chronic bronchitis with exacerbation (HCC) 05/23/2017   Mixed  hyperlipidemia 05/01/2014   Genital herpes 05/23/2012   Stress incontinence 03/04/2012   Unilateral primary osteoarthritis, right knee 10/12/2011   CTS (carpal tunnel syndrome) 07/13/2011   OSA (obstructive sleep apnea) 12/10/2010   Severe obesity (BMI >= 40) (HCC) 07/31/2008   Essential hypertension 07/03/2008    PCP: Dorrene Gaucher, NP  REFERRING PROVIDER: Arnie Lao, MD  REFERRING DIAG: M17.11 (ICD-10-CM) - Unilateral primary osteoarthritis, right knee  Rationale for Evaluation and Treatment: Rehabilitation  THERAPY DIAG:  Acute pain of right knee  Stiffness of right knee, not elsewhere classified  Other abnormalities of gait and mobility  Localized edema  Unsteadiness on feet  ONSET DATE: 08/26/23 (DOS)   SUBJECTIVE:                                                                                                                                                                                           SUBJECTIVE STATEMENT: Pt arriving today reporting increased tightness and pain of 7-8/10 in her Rt knee.   PERTINENT HISTORY:  besity, HTN, HLD, DM, OA, Lt TKA in 2010, s/p gastric sleeve resection 2022  PAIN:  Are you having pain? Yes: NPRS scale: 7 currently, up to 8, at best 6/10 Pain location: Rt knee Pain description: sharp, shooting, aching, tightness Aggravating factors: bending her knee, standing. walking  Relieving factors: medication, ice  PRECAUTIONS:  None  RED FLAGS: None   WEIGHT BEARING RESTRICTIONS:  No  FALLS:  Has patient fallen in last 6 months? No  LIVING ENVIRONMENT: Lives with: lives alone Lives in: House/apartment Stairs: Yes: External: 16 steps; can reach both Has following equipment at home: Walker - 4 wheeled  OCCUPATION:  On disability  PLOF:  Independent and Leisure: watching TV, shopping, has a fitness center at her apartment complex  PATIENT GOALS:  Improve function in knee, get back to  gym   OBJECTIVE:  Note: Objective measures were completed at Evaluation unless otherwise noted.  PATIENT SURVEYS:  Patient-Specific Activity Scoring Scheme  "0" represents "unable to perform." "10" represents "able to perform at prior level. 0 1 2 3 4 5  6  7 8 9 10  (Date and Score)   Activity Eval     1. Walking 2    2. Bending the knee 2    3. Extending the knee 2   Score 2    Total score = sum of the activity scores/number of activities Minimum detectable change (90%CI) for average score = 2 points Minimum detectable change (90%CI) for single activity score = 3 points    COGNITIVE STATUS: Within functional limits for tasks assessed   SENSATION: WFL    GAIT: 09/08/23 Distance walked: 150' within clinic Assistive device utilized: Environmental consultant - 4 wheeled Level of assistance: Modified independence Comments: decreased stance on Rt, decr hip/knee flexion on Rt   EDEMA: 09/08/23  Knee joint line: Rt: 71 cm/Lt: 48.5 cm   LOWER EXTREMITY ROM:     ROM Right eval Left eval Rt 09/13/23  Knee flexion A: 21 P: 41 A: 108 A: 48 P: 60  Knee extension A: 8 (seated LAQ) P: 0 A: 0 A: 10 P: 8   (Blank rows = not tested)   LOWER EXTREMITY MMT:    Not formally tested at eval; Rt knee grossly 3-/5  MMT Right eval Left eval  Hip flexion    Hip extension    Hip abduction    Hip adduction    Hip internal rotation    Hip external rotation    Knee flexion    Knee extension    Ankle dorsiflexion    Ankle plantarflexion    Ankle inversion    Ankle eversion     (Blank rows = not tested)     TREATMENT:                                                                                                                              DATE:  09/13/23 TherEx Seated tailgate flexion x 2 min RLE LAQ 2x10 with 3 sec hold NuStep x 10 min working on flexion with holds at end range; seat 8; L6 Quad sets: 2 x 10 holding 5 sec Manual Supine knee flexion PROM with over pressure for  extension K-tape: spider taping for increased knee swelling in pt's Rt knee around incision bandage  Modalities Vaso x 10 min, mod pressure to Rt knee and ankle; 34 deg in elevation     09/12/23 TherEx Seated tailgate flexion x 2 min RLE LAQ 2x10 with 3 sec hold NuStep x 10 min working on flexion with holds at end range; seat 8; L6  TherAct Seated AA knee flexion 10 x 10 sec hold; LLE providing RLE overpressure  Manual Seated Rt knee flexion with cues needed to decreased guarding and compensation  Modalities Vaso x 10 min, mod pressure to Rt knee and ankle; 34 deg in elevation   09/08/23 TherEx See HEP - demonstrated with trial reps performed PRN, mod cues for comprehension    Self Care Educated on POC and clinical findings; assisted with donning compression sock  and educated on edema management strategies     PATIENT EDUCATION:  Education details: HEP Person educated: Patient Education method: Programmer, multimedia, Facilities manager, and Handouts Education comprehension: verbalized understanding, returned demonstration, and needs further education  HOME EXERCISE PROGRAM: Access Code: [email or web address] URL: [email or web address] Date: 09/08/2023 Prepared by: Casimer Clear  Exercises - Ankle Pumps in Elevation  - 10 x daily - 7 x weekly - 1 sets - 10 reps - Ankle Alphabet in Elevation  - 10 x daily - 7 x weekly - 1 sets - 10 reps - Quad Set  - 5-10 x daily - 7 x weekly - 1 sets - 5-10 reps - 5 sec hold - Seated Knee Flexion Extension AROM   - 5-10 x daily - 7 x weekly - 1 sets - 5-10 reps - 5 sec  hold   ASSESSMENT:  CLINICAL IMPRESSION: Pt arriving to therapy today reporting 7-8/10 pain in her Rt knee. Pt with significant swelling around her Rt knee. Spider K-tape applied to help and pt instructed in elevation and icing at home. Pt instructed in heel to toe gait pattern as she walked using her rollator. Pt's passive Rt knee flexion today was 60 degrees.  Recommending continuation of skilled PT at least 3 times each week.      OBJECTIVE IMPAIRMENTS: Abnormal gait, decreased balance, decreased endurance, decreased knowledge of use of DME, decreased mobility, difficulty walking, decreased ROM, decreased strength, hypomobility, increased edema, increased fascial restrictions, increased muscle spasms, and pain.   ACTIVITY LIMITATIONS: carrying, lifting, sitting, standing, squatting, sleeping, stairs, transfers, bed mobility, and locomotion level  PARTICIPATION LIMITATIONS: meal prep, cleaning, laundry, driving, shopping, and community activity  PERSONAL FACTORS: 3+ comorbidities: obesity, HTN, HLD, DM, OA, Lt TKA in 2010, s/p gastric sleeve resection 2022  are also affecting patient's functional outcome.   REHAB POTENTIAL: Good  CLINICAL DECISION MAKING: Evolving/moderate complexity  EVALUATION COMPLEXITY: Moderate   GOALS: Goals reviewed with patient? Yes  SHORT TERM GOALS: Target date: 10/06/2023  Independent with initial HEP Goal status: On-going 09/13/23  2.  Rt knee ROM improved to 0-90 for improved mobility Goal status: INITIAL   LONG TERM GOALS: Target date: 11/03/2023  Independent with final HEP Goal status: INITIAL  2.  PSFS score improved by 3 points Goal status: INITIAL  3.  Rt knee AROM improved to 0-100 for improved function and mobility Goal status: INIITAL  4.  Report pain < 4/10 with standing and walking for improved function Goal status: INITIAL  5.  Amb without AD without significant deviations for improved community access Goal status: INITIAL    PLAN:  PT FREQUENCY:  2-3x/wk  PT DURATION: 8 weeks  PLANNED INTERVENTIONS: 97164- PT Re-evaluation, 97750- Physical Performance Testing, 97110-Therapeutic exercises, 97530- Therapeutic activity, [id number]- Neuromuscular re-education, 97535- Self Care, [id number]- Manual therapy, [phone]- Gait training, [phone]- Aquatic Therapy, [phone]- Electrical stimulation  (unattended), 97016- Vasopneumatic device, Patient/Family education, Balance training, Stair training, Taping, Dry Needling, Joint mobilization, DME instructions, Cryotherapy, and Moist heat.  PLAN FOR NEXT SESSION:  Review HEP PRN, edema management strategies, work on ROM as able   NEXT MD VISIT: 09/14/23   Jerrel Mor, PT, MPT 09/13/23 9:37 AM   09/13/23 9:37 AM

## 2023-09-14 ENCOUNTER — Ambulatory Visit (INDEPENDENT_AMBULATORY_CARE_PROVIDER_SITE_OTHER): Admitting: Orthopaedic Surgery

## 2023-09-14 ENCOUNTER — Encounter: Payer: Self-pay | Admitting: Orthopaedic Surgery

## 2023-09-14 DIAGNOSIS — Z96651 Presence of right artificial knee joint: Secondary | ICD-10-CM

## 2023-09-14 NOTE — Progress Notes (Signed)
 The patient is getting close to 3 weeks status post a right total knee replacement.  She is 68 years old and morbidly obese.  We had her on Xarelto  after surgery due to her allergies to aspirin .  She is coming in today for staple removal since has been over 2 weeks now.  We were concerned about removing her staples last week.  Her knee does look better overall from an incision standpoint.  The staples are removed and Steri-Strips applied.  Extension is almost full but I could only flex her to maybe 60 or 70 degrees.  I do think some of this is limited by her weight.  I do feel that she is going to increase this to further she gets out from the surgery and with having the staples removed.  Her calf is soft as well.  We will see her back in 4 weeks to see how she is doing overall.  I would like a standing AP and lateral of her right operative knee at that visit.

## 2023-09-16 ENCOUNTER — Ambulatory Visit (INDEPENDENT_AMBULATORY_CARE_PROVIDER_SITE_OTHER): Admitting: Physical Therapy

## 2023-09-16 ENCOUNTER — Encounter: Payer: Self-pay | Admitting: Physical Therapy

## 2023-09-16 DIAGNOSIS — M25561 Pain in right knee: Secondary | ICD-10-CM | POA: Diagnosis not present

## 2023-09-16 DIAGNOSIS — M25661 Stiffness of right knee, not elsewhere classified: Secondary | ICD-10-CM

## 2023-09-16 DIAGNOSIS — R2681 Unsteadiness on feet: Secondary | ICD-10-CM

## 2023-09-16 DIAGNOSIS — R2689 Other abnormalities of gait and mobility: Secondary | ICD-10-CM | POA: Diagnosis not present

## 2023-09-16 DIAGNOSIS — R600 Localized edema: Secondary | ICD-10-CM | POA: Diagnosis not present

## 2023-09-16 NOTE — Therapy (Signed)
 OUTPATIENT PHYSICAL THERAPY TREATMENT   Patient Name: Leah Hampton MRN: [id number] DOB:Jul 09, 1955, 68 y.o., female Today's Date: 09/16/2023  END OF SESSION:  PT End of Session - 09/16/23 1055     Visit Number 4    Number of Visits 24    Date for PT Re-Evaluation 11/03/23    Authorization Type [employer] Medicare Dual Complete    Progress Note Due on Visit 10    PT Start Time 1050    PT Stop Time 1140    PT Time Calculation (min) 50 min    Activity Tolerance Patient tolerated treatment well;Patient limited by pain    Behavior During Therapy [facility] for tasks assessed/performed                Past Medical History:  Diagnosis Date   Bronchitis    Chronic bronchitis (HCC)    Cystitis    Hematuria    urologice eval, Dr Clifton Dames   History of chest pain    Hyperbilirubinemia    Hyperlipemia    Hypertelorism    Hypertension    Leukocytosis    Obesity    Osteoarthritis    right knee   Pre-diabetes    Sleep apnea 2012   slight   no CPAP   Vaginal Pap smear, abnormal    Vitamin D  deficiency 02/09/2018   Past Surgical History:  Procedure Laterality Date   CERVICAL POLYPECTOMY  07/15/2020   Procedure: POLYPECTOMY with Myosure;  Surgeon: Lenord Radon, MD;  [facility];  Service: Gynecology;;   COLONOSCOPY WITH PROPOFOL  N/A 03/27/2015   Procedure: COLONOSCOPY WITH PROPOFOL ;  Surgeon: Janel Medford, MD;  Location: [facility]COPY;  Service: Endoscopy;  Laterality: N/A;   COLONOSCOPY WITH PROPOFOL  N/A 10/02/2020   Procedure: COLONOSCOPY WITH PROPOFOL ;  Surgeon: Janel Medford, MD;  Location: [facility];  Service: Endoscopy;  Laterality: N/A;   COLPOSCOPY N/A 07/15/2020   Procedure: COLPOSCOPY;  Surgeon: Lenord Radon, MD;  Location: [facility] OR;  Service: Gynecology;  Laterality: N/A;   DILATATION & CURETTAGE/HYSTEROSCOPY WITH MYOSURE N/A 01/05/2017   Procedure: DILATATION & CURETTAGE/HYSTEROSCOPY WITH MYOSURE;  Surgeon: Johnn Najjar, MD;  Location: [facility] ORS;   Service: Gynecology;  Laterality: N/A;  PostMenopausal Bleeding   HYSTEROSCOPY WITH D & C N/A 05/22/2019   Procedure: DILATATION AND CURETTAGE /HYSTEROSCOPY, POLYPECTOMY WITH MYOSURE;  Surgeon: Lenord Radon, MD;  Location: [facility] OR[facility];  Service: Gynecology;  Laterality: N/A;   HYSTEROSCOPY WITH D & C N/A 07/15/2020   Procedure: DILATATION AND CURETTAGE /HYSTEROSCOPY;  Surgeon: Lenord Radon, MD;  Location: [facility] OR;  Service: Gynecology;  Laterality: N/A;   KNEE ARTHROSCOPY  04/2007   left knee   LAPAROSCOPIC GASTRIC SLEEVE RESECTION     TOTAL KNEE ARTHROPLASTY Left 10/2008   Dr Franz Jacks   TOTAL KNEE ARTHROPLASTY Right 08/26/2023   Procedure: ARTHROPLASTY, KNEE, TOTAL;  Surgeon: Arnie Lao, MD;  Location: [facility];  Service: Orthopedics;  Laterality: Right;   UPPER GI ENDOSCOPY N/A 10/14/2020   Procedure: UPPER GI ENDOSCOPY;  Surgeon: Aldean Hummingbird, MD;  Location: [facility];  Service: General;  Laterality: N/A;   Patient Active Problem List   Diagnosis Date Noted   Status post total right knee replacement 08/26/2023   Sleep disturbance 01/21/2023   Lactose intolerance 10/22/2022   S/P gastric sleeve procedure 11/04/2020   Uncomplicated asthma 11/04/2020   Gastroesophageal reflux disease 11/04/2020   Primary osteoarthritis of both hips 09/30/2020   History of total left knee replacement 07/29/2020   Endometrial  polyp 07/15/2020   Low grade squamous intraepithelial lesion (LGSIL) on cervical Pap smear 07/07/2020   Upper abdominal pain 08/20/2019   Right hip pain 05/28/2019   Post-menopausal bleeding 05/16/2019   Greater trochanteric pain syndrome of left lower extremity 03/20/2019   Type 2 diabetes mellitus without complication, without long-term current use of insulin  (HCC) 05/18/2018   Vitamin D  deficiency 02/09/2018   Onychomycosis 09/19/2017   Tinea pedis of both feet 09/19/2017   Obstructive chronic bronchitis with exacerbation (HCC) 05/23/2017   Mixed  hyperlipidemia 05/01/2014   Genital herpes 05/23/2012   Stress incontinence 03/04/2012   Unilateral primary osteoarthritis, right knee 10/12/2011   CTS (carpal tunnel syndrome) 07/13/2011   OSA (obstructive sleep apnea) 12/10/2010   Severe obesity (BMI >= 40) (HCC) 07/31/2008   Essential hypertension 07/03/2008    PCP: Dorrene Gaucher, NP  REFERRING PROVIDER: Arnie Lao, MD  REFERRING DIAG: M17.11 (ICD-10-CM) - Unilateral primary osteoarthritis, right knee  Rationale for Evaluation and Treatment: Rehabilitation  THERAPY DIAG:  Acute pain of right knee  Stiffness of right knee, not elsewhere classified  Other abnormalities of gait and mobility  Localized edema  Unsteadiness on feet  ONSET DATE: 08/26/23 (DOS)   SUBJECTIVE:                                                                                                                                                                                           SUBJECTIVE STATEMENT: Knee is doing pretty well; had the staples removed.   PERTINENT HISTORY:  besity, HTN, HLD, DM, OA, Lt TKA in 2010, s/p gastric sleeve resection 2022  PAIN:  Are you having pain? Yes: NPRS scale: 5 currently, up to 8, at best 6/10 Pain location: Rt knee Pain description: sharp, shooting, aching, tightness Aggravating factors: bending her knee, standing. walking  Relieving factors: medication, ice  PRECAUTIONS:  None  RED FLAGS: None   WEIGHT BEARING RESTRICTIONS:  No  FALLS:  Has patient fallen in last 6 months? No  LIVING ENVIRONMENT: Lives with: lives alone Lives in: House/apartment Stairs: Yes: External: 16 steps; can reach both Has following equipment at home: Walker - 4 wheeled  OCCUPATION:  On disability  PLOF:  Independent and Leisure: watching TV, shopping, has a fitness center at her apartment complex  PATIENT GOALS:  Improve function in knee, get back to gym   OBJECTIVE:  Note: Objective  measures were completed at Evaluation unless otherwise noted.  PATIENT SURVEYS:  Patient-Specific Activity Scoring Scheme  "0" represents "unable to perform." "10" represents "able to perform at prior level. 0 1 2 3 4 5 6 7 8 9  10 (  Date and Score)   Activity Eval     1. Walking 2    2. Bending the knee 2    3. Extending the knee 2   Score 2    Total score = sum of the activity scores/number of activities Minimum detectable change (90%CI) for average score = 2 points Minimum detectable change (90%CI) for single activity score = 3 points    COGNITIVE STATUS: Within functional limits for tasks assessed   SENSATION: WFL    GAIT: 09/08/23 Distance walked: 150' within clinic Assistive device utilized: Environmental consultant - 4 wheeled Level of assistance: Modified independence Comments: decreased stance on Rt, decr hip/knee flexion on Rt   EDEMA: 09/08/23  Knee joint line: Rt: 71 cm/Lt: 48.5 cm   LOWER EXTREMITY ROM:     ROM Right eval Left eval Rt 09/13/23  Knee flexion A: 21 P: 41 A: 108 A: 48 P: 60  Knee extension A: 8 (seated LAQ) P: 0 A: 0 A: 10 P: 8   (Blank rows = not tested)   LOWER EXTREMITY MMT:    Not formally tested at eval; Rt knee grossly 3-/5  MMT Right eval Left eval  Hip flexion    Hip extension    Hip abduction    Hip adduction    Hip internal rotation    Hip external rotation    Knee flexion    Knee extension    Ankle dorsiflexion    Ankle plantarflexion    Ankle inversion    Ankle eversion     (Blank rows = not tested)     TREATMENT:                                                                                                                              DATE:  09/16/23 TherEx NuStep L6 x 10 min; holds at end range; seat 8 Seated LAQ 3x10 with 3-5 sec hold alternating LEs  TherAct Forward step ups onto 4" 2x10 bil; bil UE support needed  Neuro Re-Ed Side stepping on foam x 5 laps in // bars; light UE support needed Tandem  walking forward on foam x 2 laps; bil UE support needed Seated Rt knee flexion working on awareness and relaxation to allow knee to flex more  Modalities Vaso x 10 min, mod pressure to Rt knee; 34 deg in elevation  09/13/23 TherEx Seated tailgate flexion x 2 min RLE LAQ 2x10 with 3 sec hold NuStep x 10 min working on flexion with holds at end range; seat 8; L6 Quad sets: 2 x 10 holding 5 sec Manual Supine knee flexion PROM with over pressure for extension K-tape: spider taping for increased knee swelling in pt's Rt knee around incision bandage  Modalities Vaso x 10 min, mod pressure to Rt knee and ankle; 34 deg in elevation     09/12/23 TherEx Seated tailgate flexion x 2 min RLE LAQ 2x10 with 3 sec hold NuStep x 10  min working on flexion with holds at end range; seat 8; L6  TherAct Seated AA knee flexion 10 x 10 sec hold; LLE providing RLE overpressure  Manual Seated Rt knee flexion with cues needed to decreased guarding and compensation  Modalities Vaso x 10 min, mod pressure to Rt knee and ankle; 34 deg in elevation    PATIENT EDUCATION:  Education details: HEP Person educated: Patient Education method: Programmer, multimedia, Facilities manager, and Handouts Education comprehension: verbalized understanding, returned demonstration, and needs further education  HOME EXERCISE PROGRAM: Access Code: [email or web address] URL: [email or web address] Date: 09/08/2023 Prepared by: Casimer Clear  Exercises - Ankle Pumps in Elevation  - 10 x daily - 7 x weekly - 1 sets - 10 reps - Ankle Alphabet in Elevation  - 10 x daily - 7 x weekly - 1 sets - 10 reps - Quad Set  - 5-10 x daily - 7 x weekly - 1 sets - 5-10 reps - 5 sec hold - Seated Knee Flexion Extension AROM   - 5-10 x daily - 7 x weekly - 1 sets - 5-10 reps - 5 sec  hold   ASSESSMENT:  CLINICAL IMPRESSION: Pt tolerated session well today working on some balance and functional strengthening as well.  Swelling is  improving at this time but still limiting her flexion.  Will continue to benefit from PT to maximize function.     OBJECTIVE IMPAIRMENTS: Abnormal gait, decreased balance, decreased endurance, decreased knowledge of use of DME, decreased mobility, difficulty walking, decreased ROM, decreased strength, hypomobility, increased edema, increased fascial restrictions, increased muscle spasms, and pain.   ACTIVITY LIMITATIONS: carrying, lifting, sitting, standing, squatting, sleeping, stairs, transfers, bed mobility, and locomotion level  PARTICIPATION LIMITATIONS: meal prep, cleaning, laundry, driving, shopping, and community activity  PERSONAL FACTORS: 3+ comorbidities: obesity, HTN, HLD, DM, OA, Lt TKA in 2010, s/p gastric sleeve resection 2022  are also affecting patient's functional outcome.   REHAB POTENTIAL: Good  CLINICAL DECISION MAKING: Evolving/moderate complexity  EVALUATION COMPLEXITY: Moderate   GOALS: Goals reviewed with patient? Yes  SHORT TERM GOALS: Target date: 10/06/2023  Independent with initial HEP Goal status: On-going 09/13/23  2.  Rt knee ROM improved to 0-90 for improved mobility Goal status: INITIAL   LONG TERM GOALS: Target date: 11/03/2023  Independent with final HEP Goal status: INITIAL  2.  PSFS score improved by 3 points Goal status: INITIAL  3.  Rt knee AROM improved to 0-100 for improved function and mobility Goal status: INIITAL  4.  Report pain < 4/10 with standing and walking for improved function Goal status: INITIAL  5.  Amb without AD without significant deviations for improved community access Goal status: INITIAL    PLAN:  PT FREQUENCY:  2-3x/wk  PT DURATION: 8 weeks  PLANNED INTERVENTIONS: 97164- PT Re-evaluation, 97750- Physical Performance Testing, 97110-Therapeutic exercises, 97530- Therapeutic activity, [id number]- Neuromuscular re-education, 97535- Self Care, [id number]- Manual therapy, [phone]- Gait training, [phone]- Aquatic Therapy,  [phone]- Electrical stimulation (unattended), 97016- Vasopneumatic device, Patient/Family education, Balance training, Stair training, Taping, Dry Needling, Joint mobilization, DME instructions, Cryotherapy, and Moist heat.  PLAN FOR NEXT SESSION:  functional strengthening and mobility as able, edema management strategies, work on ROM as able   NEXT MD VISIT: 10/12/23   Marley Simmers, PT, DPT 09/16/23 11:36 AM

## 2023-09-19 ENCOUNTER — Ambulatory Visit (INDEPENDENT_AMBULATORY_CARE_PROVIDER_SITE_OTHER): Admitting: Physical Therapy

## 2023-09-19 ENCOUNTER — Encounter: Admitting: Physician Assistant

## 2023-09-19 ENCOUNTER — Encounter: Payer: Self-pay | Admitting: Physical Therapy

## 2023-09-19 DIAGNOSIS — M25551 Pain in right hip: Secondary | ICD-10-CM | POA: Diagnosis not present

## 2023-09-19 DIAGNOSIS — R600 Localized edema: Secondary | ICD-10-CM | POA: Diagnosis not present

## 2023-09-19 DIAGNOSIS — M25661 Stiffness of right knee, not elsewhere classified: Secondary | ICD-10-CM

## 2023-09-19 DIAGNOSIS — R262 Difficulty in walking, not elsewhere classified: Secondary | ICD-10-CM | POA: Diagnosis not present

## 2023-09-19 DIAGNOSIS — M25552 Pain in left hip: Secondary | ICD-10-CM

## 2023-09-19 DIAGNOSIS — M25561 Pain in right knee: Secondary | ICD-10-CM

## 2023-09-19 DIAGNOSIS — R2689 Other abnormalities of gait and mobility: Secondary | ICD-10-CM

## 2023-09-19 DIAGNOSIS — M6281 Muscle weakness (generalized): Secondary | ICD-10-CM | POA: Diagnosis not present

## 2023-09-19 DIAGNOSIS — R2681 Unsteadiness on feet: Secondary | ICD-10-CM

## 2023-09-19 NOTE — Therapy (Signed)
 OUTPATIENT PHYSICAL THERAPY TREATMENT   Patient Name: Leah Hampton MRN: [id number] DOB:31-Aug-1955, 68 y.o., female Today's Date: 09/19/2023  END OF SESSION:  PT End of Session - 09/19/23 1404     Visit Number 5    Number of Visits 24    Date for PT Re-Evaluation 11/03/23    Authorization Type [employer] Medicare Dual Complete    PT Start Time 1350    PT Stop Time 1435    PT Time Calculation (min) 45 min    Activity Tolerance Patient tolerated treatment well;Patient limited by pain    Behavior During Therapy [facility] for tasks assessed/performed                 Past Medical History:  Diagnosis Date   Bronchitis    Chronic bronchitis (HCC)    Cystitis    Hematuria    urologice eval, Dr Clifton Dames   History of chest pain    Hyperbilirubinemia    Hyperlipemia    Hypertelorism    Hypertension    Leukocytosis    Obesity    Osteoarthritis    right knee   Pre-diabetes    Sleep apnea 2012   slight   no CPAP   Vaginal Pap smear, abnormal    Vitamin D  deficiency 02/09/2018   Past Surgical History:  Procedure Laterality Date   CERVICAL POLYPECTOMY  07/15/2020   Procedure: POLYPECTOMY with Myosure;  Surgeon: Lenord Radon, MD;  Location: [facility] OR;  Service: Gynecology;;   COLONOSCOPY WITH PROPOFOL  N/A 03/27/2015   Procedure: COLONOSCOPY WITH PROPOFOL ;  Surgeon: Janel Medford, MD;  Location: [facility]COPY;  Service: Endoscopy;  Laterality: N/A;   COLONOSCOPY WITH PROPOFOL  N/A 10/02/2020   Procedure: COLONOSCOPY WITH PROPOFOL ;  Surgeon: Janel Medford, MD;  Location: [facility];  Service: Endoscopy;  Laterality: N/A;   COLPOSCOPY N/A 07/15/2020   Procedure: COLPOSCOPY;  Surgeon: Lenord Radon, MD;  Location: [facility] OR;  Service: Gynecology;  Laterality: N/A;   DILATATION & CURETTAGE/HYSTEROSCOPY WITH MYOSURE N/A 01/05/2017   Procedure: DILATATION & CURETTAGE/HYSTEROSCOPY WITH MYOSURE;  Surgeon: Johnn Najjar, MD;  Location: [facility] ORS;  Service: Gynecology;   Laterality: N/A;  PostMenopausal Bleeding   HYSTEROSCOPY WITH D & C N/A 05/22/2019   Procedure: DILATATION AND CURETTAGE /HYSTEROSCOPY, POLYPECTOMY WITH MYOSURE;  Surgeon: Lenord Radon, MD;  Location: [facility] ORS;  Service: Gynecology;  Laterality: N/A;   HYSTEROSCOPY WITH D & C N/A 07/15/2020   Procedure: DILATATION AND CURETTAGE /HYSTEROSCOPY;  Surgeon: Lenord Radon, MD;  Location: [facility] OR;  Service: Gynecology;  Laterality: N/A;   KNEE ARTHROSCOPY  04/2007   left knee   LAPAROSCOPIC GASTRIC SLEEVE RESECTION     TOTAL KNEE ARTHROPLASTY Left 10/2008   Dr Franz Jacks   TOTAL KNEE ARTHROPLASTY Right 08/26/2023   Procedure: ARTHROPLASTY, KNEE, TOTAL;  Surgeon: Arnie Lao, MD;  Location: [facility] OR[facility];  Service: Orthopedics;  Laterality: Right;   UPPER GI ENDOSCOPY N/A 10/14/2020   Procedure: UPPER GI ENDOSCOPY;  Surgeon: Aldean Hummingbird, MD;  Location: [facility] ORS;  Service: General;  Laterality: N/A;   Patient Active Problem List   Diagnosis Date Noted   Status post total right knee replacement 08/26/2023   Sleep disturbance 01/21/2023   Lactose intolerance 10/22/2022   S/P gastric sleeve procedure 11/04/2020   Uncomplicated asthma 11/04/2020   Gastroesophageal reflux disease 11/04/2020   Primary osteoarthritis of both hips 09/30/2020   History of total left knee replacement 07/29/2020   Endometrial polyp 07/15/2020   Low grade squamous intraepithelial  lesion (LGSIL) on cervical Pap smear 07/07/2020   Upper abdominal pain 08/20/2019   Right hip pain 05/28/2019   Post-menopausal bleeding 05/16/2019   Greater trochanteric pain syndrome of left lower extremity 03/20/2019   Type 2 diabetes mellitus without complication, without long-term current use of insulin  (HCC) 05/18/2018   Vitamin D  deficiency 02/09/2018   Onychomycosis 09/19/2017   Tinea pedis of both feet 09/19/2017   Obstructive chronic bronchitis with exacerbation (HCC) 05/23/2017   Mixed hyperlipidemia 05/01/2014    Genital herpes 05/23/2012   Stress incontinence 03/04/2012   Unilateral primary osteoarthritis, right knee 10/12/2011   CTS (carpal tunnel syndrome) 07/13/2011   OSA (obstructive sleep apnea) 12/10/2010   Severe obesity (BMI >= 40) (HCC) 07/31/2008   Essential hypertension 07/03/2008    PCP: Dorrene Gaucher, NP  REFERRING PROVIDER: Arnie Lao, MD  REFERRING DIAG: M17.11 (ICD-10-CM) - Unilateral primary osteoarthritis, right knee  Rationale for Evaluation and Treatment: Rehabilitation  THERAPY DIAG:  Acute pain of right knee  Stiffness of right knee, not elsewhere classified  Other abnormalities of gait and mobility  Localized edema  Unsteadiness on feet  Pain in left hip  Difficulty in walking, not elsewhere classified  Pain in right hip  Muscle weakness (generalized)  ONSET DATE: 08/26/23 (DOS)   SUBJECTIVE:                                                                                                                                                                                           SUBJECTIVE STATEMENT: Pt arriving today reporting 5/10 pain in her Rt knee. Pt still wearing compression stocking below the knee. Pt reporting the K-tape helped some with her swelling.    PERTINENT HISTORY:  besity, HTN, HLD, DM, OA, Lt TKA in 2010, s/p gastric sleeve resection 2022  PAIN:  Are you having pain? Yes: NPRS scale: 5 currently Pain location: Rt knee Pain description: sharp, shooting, aching, tightness Aggravating factors: bending her knee, standing. walking  Relieving factors: medication, ice  PRECAUTIONS:  None  RED FLAGS: None   WEIGHT BEARING RESTRICTIONS:  No  FALLS:  Has patient fallen in last 6 months? No  LIVING ENVIRONMENT: Lives with: lives alone Lives in: House/apartment Stairs: Yes: External: 16 steps; can reach both Has following equipment at home: Walker - 4 wheeled  OCCUPATION:  On disability  PLOF:   Independent and Leisure: watching TV, shopping, has a fitness center at her apartment complex  PATIENT GOALS:  Improve function in knee, get back to gym   OBJECTIVE:  Note: Objective measures were completed at Evaluation unless otherwise noted.  PATIENT SURVEYS:  Patient-Specific Activity Scoring  Scheme  "0" represents "unable to perform." "10" represents "able to perform at prior level. 0 1 2 3 4 5 6 7 8 9  10 (Date and Score)   Activity Eval     1. Walking 2    2. Bending the knee 2    3. Extending the knee 2   Score 2    Total score = sum of the activity scores/number of activities Minimum detectable change (90%CI) for average score = 2 points Minimum detectable change (90%CI) for single activity score = 3 points    COGNITIVE STATUS: Within functional limits for tasks assessed   SENSATION: WFL  GAIT: 09/08/23 Distance walked: 150' within clinic Assistive device utilized: Environmental consultant - 4 wheeled Level of assistance: Modified independence Comments: decreased stance on Rt, decr hip/knee flexion on Rt   EDEMA: 09/08/23  Knee joint line: Rt: 71 cm/Lt: 48.5 cm 09/19/23: knee joint line: Rt: 59.5 centimeters   LOWER EXTREMITY ROM:     ROM Right eval Left eval Rt 09/13/23 Rt 09/19/23  Knee flexion A: 21 P: 41 A: 108 A: 48 P: 60 A: 50 P: 60  Knee extension A: 8 (seated LAQ) P: 0 A: 0 A: 10 P: 8 A: 10 P: 6   (Blank rows = not tested)   LOWER EXTREMITY MMT:    Not formally tested at eval; Rt knee grossly 3-/5  MMT Right eval Left eval  Hip flexion    Hip extension    Hip abduction    Hip adduction    Hip internal rotation    Hip external rotation    Knee flexion    Knee extension    Ankle dorsiflexion    Ankle plantarflexion    Ankle inversion    Ankle eversion     (Blank rows = not tested)     TREATMENT:                                                                                                                              DATE:   09/19/23 TherEx NuStep L6 x 10 min; holds at end range; seat 8 Seated LAQ 3x10 with 3-5 sec hold Rt LE only Calf raises x 15 c UE support ROM measurements and edema measurements (see charts above)  TherAct Forward step ups onto 4" 2x10 bil; bil UE support needed Sit to stand x 10   Neuro Re-Ed Contract/Relax x 10  opposite LE performing contralateral motion Lunges on 6 inch step with left foot on top of step rocking back and forth  Modalities Vaso x 10 min, mod pressure to Rt knee; 34 deg in elevation    09/16/23 TherEx NuStep L6 x 10 min; holds at end range; seat 8 Seated LAQ 3x10 with 3-5 sec hold alternating LEs  TherAct Forward step ups onto 4" 2x10 bil; bil UE support needed  Neuro Re-Ed Side stepping on foam x 5 laps in // bars; light UE support needed  Tandem walking forward on foam x 2 laps; bil UE support needed Seated Rt knee flexion working on awareness and relaxation to allow knee to flex more  Modalities Vaso x 10 min, mod pressure to Rt knee; 34 deg in elevation    09/13/23 TherEx Seated tailgate flexion x 2 min RLE LAQ 2x10 with 3 sec hold NuStep x 10 min working on flexion with holds at end range; seat 8; L6 Quad sets: 2 x 10 holding 5 sec Manual Supine knee flexion PROM with over pressure for extension K-tape: spider taping for increased knee swelling in pt's Rt knee around incision bandage  Modalities Vaso x 10 min, mod pressure to Rt knee and ankle; 34 deg in elevation   PATIENT EDUCATION:  Education details: HEP Person educated: Patient Education method: Programmer, multimedia, Facilities manager, and Handouts Education comprehension: verbalized understanding, returned demonstration, and needs further education  HOME EXERCISE PROGRAM: Access Code: [email or web address] URL: [email or web address] Date: 09/08/2023 Prepared by: Casimer Clear  Exercises - Ankle Pumps in Elevation  - 10 x daily - 7 x weekly - 1 sets - 10 reps - Ankle Alphabet in  Elevation  - 10 x daily - 7 x weekly - 1 sets - 10 reps - Quad Set  - 5-10 x daily - 7 x weekly - 1 sets - 5-10 reps - 5 sec hold - Seated Knee Flexion Extension AROM   - 5-10 x daily - 7 x weekly - 1 sets - 5-10 reps - 5 sec  hold   ASSESSMENT:  CLINICAL IMPRESSION: Pt tolerating exercises with limitations at end range due to pain. Rt knee flexion and extension. Pt instructed in continued elevation and knee flexion mobility and ROM exercises for home. Continue skilled PT interventions.      OBJECTIVE IMPAIRMENTS: Abnormal gait, decreased balance, decreased endurance, decreased knowledge of use of DME, decreased mobility, difficulty walking, decreased ROM, decreased strength, hypomobility, increased edema, increased fascial restrictions, increased muscle spasms, and pain.   ACTIVITY LIMITATIONS: carrying, lifting, sitting, standing, squatting, sleeping, stairs, transfers, bed mobility, and locomotion level  PARTICIPATION LIMITATIONS: meal prep, cleaning, laundry, driving, shopping, and community activity  PERSONAL FACTORS: 3+ comorbidities: obesity, HTN, HLD, DM, OA, Lt TKA in 2010, s/p gastric sleeve resection 2022  are also affecting patient's functional outcome.   REHAB POTENTIAL: Good  CLINICAL DECISION MAKING: Evolving/moderate complexity  EVALUATION COMPLEXITY: Moderate   GOALS: Goals reviewed with patient? Yes  SHORT TERM GOALS: Target date: 10/06/2023  Independent with initial HEP Goal status: On-going 09/13/23  2.  Rt knee ROM improved to 0-90 for improved mobility Goal status: INITIAL   LONG TERM GOALS: Target date: 11/03/2023  Independent with final HEP Goal status: INITIAL  2.  PSFS score improved by 3 points Goal status: INITIAL  3.  Rt knee AROM improved to 0-100 for improved function and mobility Goal status: INIITAL  4.  Report pain < 4/10 with standing and walking for improved function Goal status: INITIAL  5.  Amb without AD without significant  deviations for improved community access Goal status: INITIAL    PLAN:  PT FREQUENCY:  2-3x/wk  PT DURATION: 8 weeks  PLANNED INTERVENTIONS: 97164- PT Re-evaluation, 97750- Physical Performance Testing, 97110-Therapeutic exercises, 97530- Therapeutic activity, [id number]- Neuromuscular re-education, 97535- Self Care, 16109- Manual therapy, [phone]- Gait training, [phone]- Aquatic Therapy, [phone]- Electrical stimulation (unattended), 97016- Vasopneumatic device, Patient/Family education, Balance training, Stair training, Taping, Dry Needling, Joint mobilization, DME instructions, Cryotherapy, and Moist heat.  PLAN FOR NEXT SESSION:  functional strengthening  and mobility as able, edema management strategies, work on ROM as able   NEXT MD VISIT: 10/12/23   Jerrel Mor, PT, MPT 09/19/23 2:26 PM   09/19/23 2:26 PM

## 2023-09-21 ENCOUNTER — Ambulatory Visit (INDEPENDENT_AMBULATORY_CARE_PROVIDER_SITE_OTHER): Admitting: Physical Therapy

## 2023-09-21 ENCOUNTER — Telehealth: Payer: Self-pay | Admitting: Orthopaedic Surgery

## 2023-09-21 ENCOUNTER — Other Ambulatory Visit: Payer: Self-pay | Admitting: Orthopaedic Surgery

## 2023-09-21 ENCOUNTER — Encounter: Payer: Self-pay | Admitting: Physical Therapy

## 2023-09-21 DIAGNOSIS — M25661 Stiffness of right knee, not elsewhere classified: Secondary | ICD-10-CM

## 2023-09-21 DIAGNOSIS — R600 Localized edema: Secondary | ICD-10-CM | POA: Diagnosis not present

## 2023-09-21 DIAGNOSIS — M25561 Pain in right knee: Secondary | ICD-10-CM | POA: Diagnosis not present

## 2023-09-21 DIAGNOSIS — R2689 Other abnormalities of gait and mobility: Secondary | ICD-10-CM | POA: Diagnosis not present

## 2023-09-21 DIAGNOSIS — R2681 Unsteadiness on feet: Secondary | ICD-10-CM | POA: Diagnosis not present

## 2023-09-21 MED ORDER — OXYCODONE HCL 5 MG PO TABS: 5.0000 mg | ORAL_TABLET | Freq: Four times a day (QID) | ORAL | 0 refills | Status: DC | PRN

## 2023-09-21 NOTE — Telephone Encounter (Signed)
 Patient called and said she needs a refill on the Oxycodone  but need authorization from the pharmacy. CB#[phone]

## 2023-09-21 NOTE — Therapy (Signed)
 OUTPATIENT PHYSICAL THERAPY TREATMENT   Patient Name: Leah Hampton MRN: [id number] DOB:04/24/1956, 68 y.o., female Today's Date: 09/21/2023  END OF SESSION:  PT End of Session - 09/21/23 1351     Visit Number 6    Number of Visits 24    Date for PT Re-Evaluation 11/03/23    Authorization Type [employer] Medicare Dual Complete    Authorization Time Period No authorization needed    Progress Note Due on Visit 10    PT Start Time 1300    PT Stop Time 1353    PT Time Calculation (min) 53 min    Activity Tolerance Patient tolerated treatment well;Patient limited by pain    Behavior During Therapy [facility] for tasks assessed/performed                  Past Medical History:  Diagnosis Date   Bronchitis    Chronic bronchitis (HCC)    Cystitis    Hematuria    urologice eval, Dr Clifton Dames   History of chest pain    Hyperbilirubinemia    Hyperlipemia    Hypertelorism    Hypertension    Leukocytosis    Obesity    Osteoarthritis    right knee   Pre-diabetes    Sleep apnea 2012   slight   no CPAP   Vaginal Pap smear, abnormal    Vitamin D  deficiency 02/09/2018   Past Surgical History:  Procedure Laterality Date   CERVICAL POLYPECTOMY  07/15/2020   Procedure: POLYPECTOMY with Myosure;  Surgeon: Lenord Radon, MD;  Location: [facility] OR;  Service: Gynecology;;   COLONOSCOPY WITH PROPOFOL  N/A 03/27/2015   Procedure: COLONOSCOPY WITH PROPOFOL ;  Surgeon: Janel Medford, MD;  Location: [facility]COPY;  Service: Endoscopy;  Laterality: N/A;   COLONOSCOPY WITH PROPOFOL  N/A 10/02/2020   Procedure: COLONOSCOPY WITH PROPOFOL ;  Surgeon: Janel Medford, MD;  Location: [facility];  Service: Endoscopy;  Laterality: N/A;   COLPOSCOPY N/A 07/15/2020   Procedure: COLPOSCOPY;  Surgeon: Lenord Radon, MD;  Location: [facility] OR;  Service: Gynecology;  Laterality: N/A;   DILATATION & CURETTAGE/HYSTEROSCOPY WITH MYOSURE N/A 01/05/2017   Procedure: DILATATION & CURETTAGE/HYSTEROSCOPY WITH  MYOSURE;  Surgeon: Johnn Najjar, MD;  Location: [facility] ORS;  Service: Gynecology;  Laterality: N/A;  PostMenopausal Bleeding   HYSTEROSCOPY WITH D & C N/A 05/22/2019   Procedure: DILATATION AND CURETTAGE /HYSTEROSCOPY, POLYPECTOMY WITH MYOSURE;  Surgeon: Lenord Radon, MD;  Location: [facility] OR[facility];  Service: Gynecology;  Laterality: N/A;   HYSTEROSCOPY WITH D & C N/A 07/15/2020   Procedure: DILATATION AND CURETTAGE /HYSTEROSCOPY;  Surgeon: Lenord Radon, MD;  Location: [facility] OR;  Service: Gynecology;  Laterality: N/A;   KNEE ARTHROSCOPY  04/2007   left knee   LAPAROSCOPIC GASTRIC SLEEVE RESECTION     TOTAL KNEE ARTHROPLASTY Left 10/2008   Dr Franz Jacks   TOTAL KNEE ARTHROPLASTY Right 08/26/2023   Procedure: ARTHROPLASTY, KNEE, TOTAL;  Surgeon: Arnie Lao, MD;  Location: [facility];  Service: Orthopedics;  Laterality: Right;   UPPER GI ENDOSCOPY N/A 10/14/2020   Procedure: UPPER GI ENDOSCOPY;  Surgeon: Aldean Hummingbird, MD;  Location: [facility] OR[facility];  Service: General;  Laterality: N/A;   Patient Active Problem List   Diagnosis Date Noted   Status post total right knee replacement 08/26/2023   Sleep disturbance 01/21/2023   Lactose intolerance 10/22/2022   S/P gastric sleeve procedure 11/04/2020   Uncomplicated asthma 11/04/2020   Gastroesophageal reflux disease 11/04/2020   Primary osteoarthritis of both hips 09/30/2020  History of total left knee replacement 07/29/2020   Endometrial polyp 07/15/2020   Low grade squamous intraepithelial lesion (LGSIL) on cervical Pap smear 07/07/2020   Upper abdominal pain 08/20/2019   Right hip pain 05/28/2019   Post-menopausal bleeding 05/16/2019   Greater trochanteric pain syndrome of left lower extremity 03/20/2019   Type 2 diabetes mellitus without complication, without long-term current use of insulin  (HCC) 05/18/2018   Vitamin D  deficiency 02/09/2018   Onychomycosis 09/19/2017   Tinea pedis of both feet 09/19/2017   Obstructive chronic  bronchitis with exacerbation (HCC) 05/23/2017   Mixed hyperlipidemia 05/01/2014   Genital herpes 05/23/2012   Stress incontinence 03/04/2012   Unilateral primary osteoarthritis, right knee 10/12/2011   CTS (carpal tunnel syndrome) 07/13/2011   OSA (obstructive sleep apnea) 12/10/2010   Severe obesity (BMI >= 40) (HCC) 07/31/2008   Essential hypertension 07/03/2008    PCP: Dorrene Gaucher, NP  REFERRING PROVIDER: Arnie Lao, MD  REFERRING DIAG: M17.11 (ICD-10-CM) - Unilateral primary osteoarthritis, right knee  Rationale for Evaluation and Treatment: Rehabilitation  THERAPY DIAG:  Acute pain of right knee  Stiffness of right knee, not elsewhere classified  Other abnormalities of gait and mobility  Localized edema  Unsteadiness on feet  ONSET DATE: 08/26/23 (DOS)   SUBJECTIVE:                                                                                                                                                                                           SUBJECTIVE STATEMENT: She has been trying to do her exercises "as much as I can."   PERTINENT HISTORY:  besity, HTN, HLD, DM, OA, Lt TKA in 2010, s/p gastric sleeve resection 2022  PAIN:  Are you having pain? Yes: NPRS scale:  at rest 3-4/10 and moving 5/10 Pain location: Rt knee Pain description: sharp, shooting, aching, tightness Aggravating factors: bending her knee, standing. walking  Relieving factors: medication, ice  PRECAUTIONS:  None  RED FLAGS: None   WEIGHT BEARING RESTRICTIONS:  No  FALLS:  Has patient fallen in last 6 months? No  LIVING ENVIRONMENT: Lives with: lives alone Lives in: House/apartment Stairs: Yes: External: 16 steps; can reach both Has following equipment at home: Walker - 4 wheeled  OCCUPATION:  On disability  PLOF:  Independent and Leisure: watching TV, shopping, has a fitness center at her apartment complex  PATIENT GOALS:  Improve function in  knee, get back to gym   OBJECTIVE:  Note: Objective measures were completed at Evaluation unless otherwise noted.  PATIENT SURVEYS:  Patient-Specific Activity Scoring Scheme  "0" represents "unable to perform." "10" represents "able to perform at  prior level. 0 1 2 3 4 5 6 7 8 9  10 (Date and Score)   Activity Eval     1. Walking 2    2. Bending the knee 2    3. Extending the knee 2   Score 2    Total score = sum of the activity scores/number of activities Minimum detectable change (90%CI) for average score = 2 points Minimum detectable change (90%CI) for single activity score = 3 points    COGNITIVE STATUS: Within functional limits for tasks assessed   SENSATION: WFL  GAIT: 09/08/23 Distance walked: 150' within clinic Assistive device utilized: Environmental consultant - 4 wheeled Level of assistance: Modified independence Comments: decreased stance on Rt, decr hip/knee flexion on Rt   EDEMA: 09/08/23  Knee joint line: Rt: 71 cm/Lt: 48.5 cm 09/19/23: knee joint line: Rt: 59.5 centimeters   LOWER EXTREMITY ROM:     ROM Right eval Left eval Rt 09/13/23 Rt 09/19/23 Right 09/21/23  Knee flexion A: 21 P: 41 A: 108 A: 48 P: 60 A: 50 P: 60 P: 71*  Knee extension A: 8 (seated LAQ) P: 0 A: 0 A: 10 P: 8 A: 10 P: 6    (Blank rows = not tested)   LOWER EXTREMITY MMT:    Not formally tested at eval; Rt knee grossly 3-/5  MMT Right eval Left eval  Hip flexion    Hip extension    Hip abduction    Hip adduction    Hip internal rotation    Hip external rotation    Knee flexion    Knee extension    Ankle dorsiflexion    Ankle plantarflexion    Ankle inversion    Ankle eversion     (Blank rows = not tested)     TREATMENT:                                                                                                                              DATE:  09/21/2023 Therapeutic Exercise: Nustep BUEs & BLEs level 7 seat 10 for 3 min, then seat 9 (increase flexion) 3 min, seat  8 2 min Gastroc stretch step heel depression 30 sec 3 reps LAQ RLE with PT end range assist for increased ext and isometric hold higher range 10 reps  Therapeutic Activities Alternate stepping to 4" step for AROM RLE and stance stability in ext 10 reps with BUE //bars Knee flexion stretch with RLE on 8" step anterior weight shift with PT manual assist 5 sec hold 10 reps Supine RLE 12" ball & strap knee flexion (PT manual assist) and leg press / knee ext 10 reps Bridge with PT AA moving right foot closer every 3rd rep during bridge for stretch when lowers pelvis 10 reps total  Manual Therapy PROM with overpressure RLE knee flexion Contract-relax with active knee flexion seated PT manual assist for knee flexion stretch with heel slide on ball (above)  Modalities Vaso  x 10 min, mod pressure to Rt knee; 34 deg in elevation    09/19/23 TherEx NuStep L6 x 10 min; holds at end range; seat 8 Seated LAQ 3x10 with 3-5 sec hold Rt LE only Calf raises x 15 c UE support ROM measurements and edema measurements (see charts above)  TherAct Forward step ups onto 4" 2x10 bil; bil UE support needed Sit to stand x 10   Neuro Re-Ed Contract/Relax x 10  opposite LE performing contralateral motion Lunges on 6 inch step with left foot on top of step rocking back and forth  Modalities Vaso x 10 min, mod pressure to Rt knee; 34 deg in elevation    09/16/23 TherEx NuStep L6 x 10 min; holds at end range; seat 8 Seated LAQ 3x10 with 3-5 sec hold alternating LEs  TherAct Forward step ups onto 4" 2x10 bil; bil UE support needed  Neuro Re-Ed Side stepping on foam x 5 laps in // bars; light UE support needed Tandem walking forward on foam x 2 laps; bil UE support needed Seated Rt knee flexion working on awareness and relaxation to allow knee to flex more  Modalities Vaso x 10 min, mod pressure to Rt knee; 34 deg in elevation    09/13/23 TherEx Seated tailgate flexion x 2 min RLE LAQ 2x10 with  3 sec hold NuStep x 10 min working on flexion with holds at end range; seat 8; L6 Quad sets: 2 x 10 holding 5 sec Manual Supine knee flexion PROM with over pressure for extension K-tape: spider taping for increased knee swelling in pt's Rt knee around incision bandage  Modalities Vaso x 10 min, mod pressure to Rt knee and ankle; 34 deg in elevation   PATIENT EDUCATION:  Education details: HEP Person educated: Patient Education method: Programmer, multimedia, Facilities manager, and Handouts Education comprehension: verbalized understanding, returned demonstration, and needs further education  HOME EXERCISE PROGRAM: Access Code: [email or web address] URL: [email or web address] Date: 09/08/2023 Prepared by: Casimer Clear  Exercises - Ankle Pumps in Elevation  - 10 x daily - 7 x weekly - 1 sets - 10 reps - Ankle Alphabet in Elevation  - 10 x daily - 7 x weekly - 1 sets - 10 reps - Quad Set  - 5-10 x daily - 7 x weekly - 1 sets - 5-10 reps - 5 sec hold - Seated Knee Flexion Extension AROM   - 5-10 x daily - 7 x weekly - 1 sets - 5-10 reps - 5 sec  hold   ASSESSMENT:  CLINICAL IMPRESSION: Patient is still limited by pain but tolerates progressive activities for range.  Continue skilled PT interventions.   OBJECTIVE IMPAIRMENTS: Abnormal gait, decreased balance, decreased endurance, decreased knowledge of use of DME, decreased mobility, difficulty walking, decreased ROM, decreased strength, hypomobility, increased edema, increased fascial restrictions, increased muscle spasms, and pain.   ACTIVITY LIMITATIONS: carrying, lifting, sitting, standing, squatting, sleeping, stairs, transfers, bed mobility, and locomotion level  PARTICIPATION LIMITATIONS: meal prep, cleaning, laundry, driving, shopping, and community activity  PERSONAL FACTORS: 3+ comorbidities: obesity, HTN, HLD, DM, OA, Lt TKA in 2010, s/p gastric sleeve resection 2022  are also affecting patient's functional outcome.   REHAB  POTENTIAL: Good  CLINICAL DECISION MAKING: Evolving/moderate complexity  EVALUATION COMPLEXITY: Moderate   GOALS: Goals reviewed with patient? Yes  SHORT TERM GOALS: Target date: 10/06/2023  Independent with initial HEP Goal status: Ongoing  09/21/2023  2.  Rt knee ROM improved to 0-90 for improved mobility Goal status: Ongoing  09/21/2023  LONG TERM GOALS: Target date: 11/03/2023  Independent with final HEP Goal status: Ongoing  09/21/2023  2.  PSFS score improved by 3 points Goal status: Ongoing  09/21/2023  3.  Rt knee AROM improved to 0-100 for improved function and mobility Goal status:  Ongoing  09/21/2023  4.  Report pain < 4/10 with standing and walking for improved function Goal status: Ongoing  09/21/2023  5.  Amb without AD without significant deviations for improved community access Goal status: Ongoing  09/21/2023    PLAN:  PT FREQUENCY:  2-3x/wk  PT DURATION: 8 weeks  PLANNED INTERVENTIONS: 97164- PT Re-evaluation, 97750- Physical Performance Testing, 97110-Therapeutic exercises, 97530- Therapeutic activity, 97112- Neuromuscular re-education, 97535- Self Care, 73710- Manual therapy, [phone]- Gait training, [phone]- Aquatic Therapy, [phone]- Electrical stimulation (unattended), 97016- Vasopneumatic device, Patient/Family education, Balance training, Stair training, Taping, Dry Needling, Joint mobilization, DME instructions, Cryotherapy, and Moist heat.  PLAN FOR NEXT SESSION: exercise & manual therapy for range,  continue with functional strengthening and mobility as able, edema management strategies   NEXT MD VISIT: 10/12/23    Lorie Rook, PT, DPT 09/21/2023, 2:04 PM

## 2023-09-22 NOTE — Therapy (Signed)
 OUTPATIENT PHYSICAL THERAPY TREATMENT   Patient Name: Leah Hampton MRN: [id number] DOB:1955/09/26, 68 y.o., female Today's Date: 09/23/2023  END OF SESSION:  PT End of Session - 09/23/23 1019     Visit Number 7    Number of Visits 24    Date for PT Re-Evaluation 11/03/23    Authorization Type [employer] Medicare Dual Complete    Authorization Time Period No authorization needed    Progress Note Due on Visit 10    PT Start Time 1016    PT Stop Time 1055    PT Time Calculation (min) 39 min    Activity Tolerance Patient tolerated treatment well;Patient limited by pain    Behavior During Therapy [facility] for tasks assessed/performed             Past Medical History:  Diagnosis Date   Bronchitis    Chronic bronchitis (HCC)    Cystitis    Hematuria    urologice eval, Dr Clifton Dames   History of chest pain    Hyperbilirubinemia    Hyperlipemia    Hypertelorism    Hypertension    Leukocytosis    Obesity    Osteoarthritis    right knee   Pre-diabetes    Sleep apnea 2012   slight   no CPAP   Vaginal Pap smear, abnormal    Vitamin D  deficiency 02/09/2018   Past Surgical History:  Procedure Laterality Date   CERVICAL POLYPECTOMY  07/15/2020   Procedure: POLYPECTOMY with Myosure;  Surgeon: Lenord Radon, MD;  Location: [facility] OR;  Service: Gynecology;;   COLONOSCOPY WITH PROPOFOL  N/A 03/27/2015   Procedure: COLONOSCOPY WITH PROPOFOL ;  Surgeon: Janel Medford, MD;  Location: [facility];  Service: Endoscopy;  Laterality: N/A;   COLONOSCOPY WITH PROPOFOL  N/A 10/02/2020   Procedure: COLONOSCOPY WITH PROPOFOL ;  Surgeon: Janel Medford, MD;  Location: [facility];  Service: Endoscopy;  Laterality: N/A;   COLPOSCOPY N/A 07/15/2020   Procedure: COLPOSCOPY;  Surgeon: Lenord Radon, MD;  Location: [facility] OR;  Service: Gynecology;  Laterality: N/A;   DILATATION & CURETTAGE/HYSTEROSCOPY WITH MYOSURE N/A 01/05/2017   Procedure: DILATATION & CURETTAGE/HYSTEROSCOPY WITH MYOSURE;   Surgeon: Johnn Najjar, MD;  Location: [facility] OR[facility];  Service: Gynecology;  Laterality: N/A;  PostMenopausal Bleeding   HYSTEROSCOPY WITH D & C N/A 05/22/2019   Procedure: DILATATION AND CURETTAGE /HYSTEROSCOPY, POLYPECTOMY WITH MYOSURE;  Surgeon: Lenord Radon, MD;  Location: [facility];  Service: Gynecology;  Laterality: N/A;   HYSTEROSCOPY WITH D & C N/A 07/15/2020   Procedure: DILATATION AND CURETTAGE /HYSTEROSCOPY;  Surgeon: Lenord Radon, MD;  Location: [facility] OR;  Service: Gynecology;  Laterality: N/A;   KNEE ARTHROSCOPY  04/2007   left knee   LAPAROSCOPIC GASTRIC SLEEVE RESECTION     TOTAL KNEE ARTHROPLASTY Left 10/2008   Dr Franz Jacks   TOTAL KNEE ARTHROPLASTY Right 08/26/2023   Procedure: ARTHROPLASTY, KNEE, TOTAL;  Surgeon: Arnie Lao, MD;  Location: [facility];  Service: Orthopedics;  Laterality: Right;   UPPER GI ENDOSCOPY N/A 10/14/2020   Procedure: UPPER GI ENDOSCOPY;  Surgeon: Aldean Hummingbird, MD;  Location: [facility];  Service: General;  Laterality: N/A;   Patient Active Problem List   Diagnosis Date Noted   Status post total right knee replacement 08/26/2023   Sleep disturbance 01/21/2023   Lactose intolerance 10/22/2022   S/P gastric sleeve procedure 11/04/2020   Uncomplicated asthma 11/04/2020   Gastroesophageal reflux disease 11/04/2020   Primary osteoarthritis of both hips 09/30/2020   History of total left  knee replacement 07/29/2020   Endometrial polyp 07/15/2020   Low grade squamous intraepithelial lesion (LGSIL) on cervical Pap smear 07/07/2020   Upper abdominal pain 08/20/2019   Right hip pain 05/28/2019   Post-menopausal bleeding 05/16/2019   Greater trochanteric pain syndrome of left lower extremity 03/20/2019   Type 2 diabetes mellitus without complication, without long-term current use of insulin  (HCC) 05/18/2018   Vitamin D  deficiency 02/09/2018   Onychomycosis 09/19/2017   Tinea pedis of both feet 09/19/2017   Obstructive chronic bronchitis  with exacerbation (HCC) 05/23/2017   Mixed hyperlipidemia 05/01/2014   Genital herpes 05/23/2012   Stress incontinence 03/04/2012   Unilateral primary osteoarthritis, right knee 10/12/2011   CTS (carpal tunnel syndrome) 07/13/2011   OSA (obstructive sleep apnea) 12/10/2010   Severe obesity (BMI >= 40) (HCC) 07/31/2008   Essential hypertension 07/03/2008    PCP: Dorrene Gaucher, NP  REFERRING PROVIDER: Arnie Lao, MD  REFERRING DIAG: M17.11 (ICD-10-CM) - Unilateral primary osteoarthritis, right knee  Rationale for Evaluation and Treatment: Rehabilitation  THERAPY DIAG:  Acute pain of right knee  Stiffness of right knee, not elsewhere classified  Other abnormalities of gait and mobility  Localized edema  Unsteadiness on feet  ONSET DATE: 08/26/23 (DOS)   SUBJECTIVE:                                                                                                                                                                                           SUBJECTIVE STATEMENT: "I think it's getting a little better."   PERTINENT HISTORY:  besity, HTN, HLD, DM, OA, Lt TKA in 2010, s/p gastric sleeve resection 2022  PAIN:  Are you having pain? Yes: NPRS scale:  4-5/10 this morning Pain location: Rt knee Pain description: sharp, shooting, aching, tightness Aggravating factors: bending her knee, standing. walking  Relieving factors: medication, ice  PRECAUTIONS:  None  RED FLAGS: None   WEIGHT BEARING RESTRICTIONS:  No  FALLS:  Has patient fallen in last 6 months? No  LIVING ENVIRONMENT: Lives with: lives alone Lives in: House/apartment Stairs: Yes: External: 16 steps; can reach both Has following equipment at home: Walker - 4 wheeled  OCCUPATION:  On disability  PLOF:  Independent and Leisure: watching TV, shopping, has a fitness center at her apartment complex  PATIENT GOALS:  Improve function in knee, get back to gym   OBJECTIVE:   Note: Objective measures were completed at Evaluation unless otherwise noted.  PATIENT SURVEYS:  Patient-Specific Activity Scoring Scheme  "0" represents "unable to perform." "10" represents "able to perform at prior level. 0 1 2 3 4 5 6 7 8 9  10 (  Date and Score)   Activity Eval     1. Walking 2    2. Bending the knee 2    3. Extending the knee 2   Score 2    Total score = sum of the activity scores/number of activities Minimum detectable change (90%CI) for average score = 2 points Minimum detectable change (90%CI) for single activity score = 3 points    COGNITIVE STATUS: Within functional limits for tasks assessed   SENSATION: WFL  GAIT: 09/08/23 Distance walked: 150' within clinic Assistive device utilized: Environmental consultant - 4 wheeled Level of assistance: Modified independence Comments: decreased stance on Rt, decr hip/knee flexion on Rt   EDEMA: 09/08/23  Knee joint line: Rt: 71 cm/Lt: 48.5 cm 09/19/23: knee joint line: Rt: 59.5 centimeters   LOWER EXTREMITY ROM:     ROM Right eval Left eval Rt 09/13/23 Rt 09/19/23 Right 09/21/23  Knee flexion A: 21 P: 41 A: 108 A: 48 P: 60 A: 50 P: 60 P: 71*  Knee extension A: 8 (seated LAQ) P: 0 A: 0 A: 10 P: 8 A: 10 P: 6    (Blank rows = not tested)   LOWER EXTREMITY MMT:    Not formally tested at eval; Rt knee grossly 3-/5  MMT Right eval Left eval  Hip flexion    Hip extension    Hip abduction    Hip adduction    Hip internal rotation    Hip external rotation    Knee flexion    Knee extension    Ankle dorsiflexion    Ankle plantarflexion    Ankle inversion    Ankle eversion     (Blank rows = not tested)     TREATMENT:                                                                                                                              DATE:  09/22/2023 Nustep UEs/LEs x 6 min total, pushing seat forward every 2 min Seated heel slide x10 Seated self knee flexion stretch with overpressure  5x10" Seated self knee flexion scooting forward 5x10" Seated hamstring curl 1.5# 2x10 Seated LAQ 1.5# 2x10 PNF contract/relax for knee flexion 5x5" Forward step tap into forward lunge on 4" step 10x5" Heel toe raise x20 Side step tap into side lunge 4" step 10x5" Vaso x 15 min, mod pressure to Rt knee; 34 deg in elevation   09/21/2023 Therapeutic Exercise: Nustep BUEs & BLEs level 7 seat 10 for 3 min, then seat 9 (increase flexion) 3 min, seat 8 2 min Gastroc stretch step heel depression 30 sec 3 reps LAQ RLE with PT end range assist for increased ext and isometric hold higher range 10 reps  Therapeutic Activities Alternate stepping to 4" step for AROM RLE and stance stability in ext 10 reps with BUE //bars Knee flexion stretch with RLE on 8" step anterior weight shift with PT manual assist 5 sec hold 10  reps Supine RLE 12" ball & strap knee flexion (PT manual assist) and leg press / knee ext 10 reps Bridge with PT AA moving right foot closer every 3rd rep during bridge for stretch when lowers pelvis 10 reps total  Manual Therapy PROM with overpressure RLE knee flexion Contract-relax with active knee flexion seated PT manual assist for knee flexion stretch with heel slide on ball (above)  Modalities Vaso x 10 min, mod pressure to Rt knee; 34 deg in elevation    09/19/23 TherEx NuStep L6 x 10 min; holds at end range; seat 8 Seated LAQ 3x10 with 3-5 sec hold Rt LE only Calf raises x 15 c UE support ROM measurements and edema measurements (see charts above)  TherAct Forward step ups onto 4" 2x10 bil; bil UE support needed Sit to stand x 10   Neuro Re-Ed Contract/Relax x 10  opposite LE performing contralateral motion Lunges on 6 inch step with left foot on top of step rocking back and forth  Modalities Vaso x 10 min, mod pressure to Rt knee; 34 deg in elevation    09/16/23 TherEx NuStep L6 x 10 min; holds at end range; seat 8 Seated LAQ 3x10 with 3-5 sec hold  alternating LEs  TherAct Forward step ups onto 4" 2x10 bil; bil UE support needed  Neuro Re-Ed Side stepping on foam x 5 laps in // bars; light UE support needed Tandem walking forward on foam x 2 laps; bil UE support needed Seated Rt knee flexion working on awareness and relaxation to allow knee to flex more  Modalities Vaso x 10 min, mod pressure to Rt knee; 34 deg in elevation    09/13/23 TherEx Seated tailgate flexion x 2 min RLE LAQ 2x10 with 3 sec hold NuStep x 10 min working on flexion with holds at end range; seat 8; L6 Quad sets: 2 x 10 holding 5 sec Manual Supine knee flexion PROM with over pressure for extension K-tape: spider taping for increased knee swelling in pt's Rt knee around incision bandage  Modalities Vaso x 10 min, mod pressure to Rt knee and ankle; 34 deg in elevation   PATIENT EDUCATION:  Education details: HEP Person educated: Patient Education method: Programmer, multimedia, Facilities manager, and Handouts Education comprehension: verbalized understanding, returned demonstration, and needs further education  HOME EXERCISE PROGRAM: Access Code: [email or web address] URL: [email or web address] Date: 09/08/2023 Prepared by: Casimer Clear  Exercises - Ankle Pumps in Elevation  - 10 x daily - 7 x weekly - 1 sets - 10 reps - Ankle Alphabet in Elevation  - 10 x daily - 7 x weekly - 1 sets - 10 reps - Quad Set  - 5-10 x daily - 7 x weekly - 1 sets - 5-10 reps - 5 sec hold - Seated Knee Flexion Extension AROM   - 5-10 x daily - 7 x weekly - 1 sets - 5-10 reps - 5 sec  hold   ASSESSMENT:  CLINICAL IMPRESSION: Remains mostly limited with knee flexion. Session primarily focused on improving knee flexion ROM and hamstring firing. Continued standing and weightbearing exercises.   OBJECTIVE IMPAIRMENTS: Abnormal gait, decreased balance, decreased endurance, decreased knowledge of use of DME, decreased mobility, difficulty walking, decreased ROM, decreased  strength, hypomobility, increased edema, increased fascial restrictions, increased muscle spasms, and pain.     GOALS: Goals reviewed with patient? Yes  SHORT TERM GOALS: Target date: 10/06/2023  Independent with initial HEP Goal status: Ongoing  09/21/2023  2.  Rt knee ROM improved to 0-90  for improved mobility Goal status: Ongoing  09/21/2023   LONG TERM GOALS: Target date: 11/03/2023  Independent with final HEP Goal status: Ongoing  09/21/2023  2.  PSFS score improved by 3 points Goal status: Ongoing  09/21/2023  3.  Rt knee AROM improved to 0-100 for improved function and mobility Goal status:  Ongoing  09/21/2023  4.  Report pain < 4/10 with standing and walking for improved function Goal status: Ongoing  09/21/2023  5.  Amb without AD without significant deviations for improved community access Goal status: Ongoing  09/21/2023    PLAN:  PT FREQUENCY: 2-3x/wk  PT DURATION: 8 weeks  PLANNED INTERVENTIONS: 97164- PT Re-evaluation, 97750- Physical Performance Testing, 97110-Therapeutic exercises, 97530- Therapeutic activity, 97112- Neuromuscular re-education, 97535- Self Care, 16109- Manual therapy, [phone]- Gait training, [phone]- Aquatic Therapy, [phone]- Electrical stimulation (unattended), 97016- Vasopneumatic device, Patient/Family education, Balance training, Stair training, Taping, Dry Needling, Joint mobilization, DME instructions, Cryotherapy, and Moist heat.  PLAN FOR NEXT SESSION: exercise & manual therapy for range,  continue with functional strengthening and mobility as able, edema management strategies   NEXT MD VISIT: 10/12/23    Charley Lafrance April Ma L Melessia Kaus, PT, DPT 09/23/2023, 10:19 AM

## 2023-09-23 ENCOUNTER — Ambulatory Visit (INDEPENDENT_AMBULATORY_CARE_PROVIDER_SITE_OTHER): Admitting: Physical Therapy

## 2023-09-23 DIAGNOSIS — R2689 Other abnormalities of gait and mobility: Secondary | ICD-10-CM | POA: Diagnosis not present

## 2023-09-23 DIAGNOSIS — M25561 Pain in right knee: Secondary | ICD-10-CM | POA: Diagnosis not present

## 2023-09-23 DIAGNOSIS — R2681 Unsteadiness on feet: Secondary | ICD-10-CM | POA: Diagnosis not present

## 2023-09-23 DIAGNOSIS — M25661 Stiffness of right knee, not elsewhere classified: Secondary | ICD-10-CM | POA: Diagnosis not present

## 2023-09-23 DIAGNOSIS — R600 Localized edema: Secondary | ICD-10-CM | POA: Diagnosis not present

## 2023-09-26 ENCOUNTER — Telehealth: Payer: Self-pay | Admitting: *Deleted

## 2023-09-26 ENCOUNTER — Encounter: Admitting: Physical Therapy

## 2023-09-26 NOTE — Telephone Encounter (Signed)
 Spoke to patient and she mentioned having to cancel her therapy today due to her ride with [employer]?? She said she was going to call them and find out what to do, b/c she only has 1 ride left with them. I told her we could help her with a taxi voucher since she is post op and still attending therapy here. Will you help her get scheduled? Thank you.

## 2023-09-28 ENCOUNTER — Encounter: Payer: Self-pay | Admitting: Rehabilitative and Restorative Service Providers"

## 2023-09-28 ENCOUNTER — Ambulatory Visit: Admitting: Rehabilitative and Restorative Service Providers"

## 2023-09-28 DIAGNOSIS — R600 Localized edema: Secondary | ICD-10-CM | POA: Diagnosis not present

## 2023-09-28 DIAGNOSIS — R2681 Unsteadiness on feet: Secondary | ICD-10-CM

## 2023-09-28 DIAGNOSIS — M25661 Stiffness of right knee, not elsewhere classified: Secondary | ICD-10-CM | POA: Diagnosis not present

## 2023-09-28 DIAGNOSIS — M25561 Pain in right knee: Secondary | ICD-10-CM | POA: Diagnosis not present

## 2023-09-28 DIAGNOSIS — R2689 Other abnormalities of gait and mobility: Secondary | ICD-10-CM

## 2023-09-28 NOTE — Therapy (Signed)
 OUTPATIENT PHYSICAL THERAPY TREATMENT   Patient Name: JAKEYA STERKEL MRN: [id number] DOB:June 22, 1955, 68 y.o., female Today's Date: 09/28/2023  END OF SESSION:  PT End of Session - 09/28/23 1050     Visit Number 8    Number of Visits 24    Date for PT Re-Evaluation 11/03/23    Authorization Type [employer] Medicare Dual Complete    Authorization Time Period No authorization needed    Progress Note Due on Visit 10    PT Start Time 1050    PT Stop Time 1140    PT Time Calculation (min) 50 min    Activity Tolerance Patient limited by pain    Behavior During Therapy [facility] for tasks assessed/performed              Past Medical History:  Diagnosis Date   Bronchitis    Chronic bronchitis (HCC)    Cystitis    Hematuria    urologice eval, Dr Clifton Dames   History of chest pain    Hyperbilirubinemia    Hyperlipemia    Hypertelorism    Hypertension    Leukocytosis    Obesity    Osteoarthritis    right knee   Pre-diabetes    Sleep apnea 2012   slight   no CPAP   Vaginal Pap smear, abnormal    Vitamin D  deficiency 02/09/2018   Past Surgical History:  Procedure Laterality Date   CERVICAL POLYPECTOMY  07/15/2020   Procedure: POLYPECTOMY with Myosure;  Surgeon: Lenord Radon, MD;  Location: [facility] OR;  Service: Gynecology;;   COLONOSCOPY WITH PROPOFOL  N/A 03/27/2015   Procedure: COLONOSCOPY WITH PROPOFOL ;  Surgeon: Janel Medford, MD;  Location: [facility];  Service: Endoscopy;  Laterality: N/A;   COLONOSCOPY WITH PROPOFOL  N/A 10/02/2020   Procedure: COLONOSCOPY WITH PROPOFOL ;  Surgeon: Janel Medford, MD;  Location: [facility];  Service: Endoscopy;  Laterality: N/A;   COLPOSCOPY N/A 07/15/2020   Procedure: COLPOSCOPY;  Surgeon: Lenord Radon, MD;  Location: [facility] OR;  Service: Gynecology;  Laterality: N/A;   DILATATION & CURETTAGE/HYSTEROSCOPY WITH MYOSURE N/A 01/05/2017   Procedure: DILATATION & CURETTAGE/HYSTEROSCOPY WITH MYOSURE;  Surgeon: Johnn Najjar, MD;   Location: [facility];  Service: Gynecology;  Laterality: N/A;  PostMenopausal Bleeding   HYSTEROSCOPY WITH D & C N/A 05/22/2019   Procedure: DILATATION AND CURETTAGE /HYSTEROSCOPY, POLYPECTOMY WITH MYOSURE;  Surgeon: Lenord Radon, MD;  Location: [facility];  Service: Gynecology;  Laterality: N/A;   HYSTEROSCOPY WITH D & C N/A 07/15/2020   Procedure: DILATATION AND CURETTAGE /HYSTEROSCOPY;  Surgeon: Lenord Radon, MD;  Location: [facility] OR;  Service: Gynecology;  Laterality: N/A;   KNEE ARTHROSCOPY  04/2007   left knee   LAPAROSCOPIC GASTRIC SLEEVE RESECTION     TOTAL KNEE ARTHROPLASTY Left 10/2008   Dr Franz Jacks   TOTAL KNEE ARTHROPLASTY Right 08/26/2023   Procedure: ARTHROPLASTY, KNEE, TOTAL;  Surgeon: Arnie Lao, MD;  Location: [facility];  Service: Orthopedics;  Laterality: Right;   UPPER GI ENDOSCOPY N/A 10/14/2020   Procedure: UPPER GI ENDOSCOPY;  Surgeon: Aldean Hummingbird, MD;  Location: [facility];  Service: General;  Laterality: N/A;   Patient Active Problem List   Diagnosis Date Noted   Status post total right knee replacement 08/26/2023   Sleep disturbance 01/21/2023   Lactose intolerance 10/22/2022   S/P gastric sleeve procedure 11/04/2020   Uncomplicated asthma 11/04/2020   Gastroesophageal reflux disease 11/04/2020   Primary osteoarthritis of both hips 09/30/2020   History of total left knee replacement  07/29/2020   Endometrial polyp 07/15/2020   Low grade squamous intraepithelial lesion (LGSIL) on cervical Pap smear 07/07/2020   Upper abdominal pain 08/20/2019   Right hip pain 05/28/2019   Post-menopausal bleeding 05/16/2019   Greater trochanteric pain syndrome of left lower extremity 03/20/2019   Type 2 diabetes mellitus without complication, without long-term current use of insulin  (HCC) 05/18/2018   Vitamin D  deficiency 02/09/2018   Onychomycosis 09/19/2017   Tinea pedis of both feet 09/19/2017   Obstructive chronic bronchitis with exacerbation (HCC)  05/23/2017   Mixed hyperlipidemia 05/01/2014   Genital herpes 05/23/2012   Stress incontinence 03/04/2012   Unilateral primary osteoarthritis, right knee 10/12/2011   CTS (carpal tunnel syndrome) 07/13/2011   OSA (obstructive sleep apnea) 12/10/2010   Severe obesity (BMI >= 40) (HCC) 07/31/2008   Essential hypertension 07/03/2008    PCP: Dorrene Gaucher, NP  REFERRING PROVIDER: Arnie Lao, MD  REFERRING DIAG: M17.11 (ICD-10-CM) - Unilateral primary osteoarthritis, right knee  Rationale for Evaluation and Treatment: Rehabilitation  THERAPY DIAG:  Acute pain of right knee  Stiffness of right knee, not elsewhere classified  Other abnormalities of gait and mobility  Localized edema  Unsteadiness on feet  ONSET DATE: 08/26/23 (DOS)   SUBJECTIVE:                                                                                                                                                                                           SUBJECTIVE STATEMENT: Pt indicated mild to moderate symptoms at most in last day or two.  Pt indicated trying to get knee bending more and using HEP and cubi exercise equipment.  PERTINENT HISTORY:  besity, HTN, HLD, DM, OA, Lt TKA in 2010, s/p gastric sleeve resection 2022  PAIN:   NPRS scale:  up to 4/10  Pain location: Rt knee Pain description: sharp, shooting, aching, tightness Aggravating factors: bending her knee, standing. walking  Relieving factors: medication, ice  PRECAUTIONS:  None  RED FLAGS: None   WEIGHT BEARING RESTRICTIONS:  No  FALLS:  Has patient fallen in last 6 months? No  LIVING ENVIRONMENT: Lives with: lives alone Lives in: House/apartment Stairs: Yes: External: 16 steps; can reach both Has following equipment at home: Walker - 4 wheeled  OCCUPATION:  On disability  PLOF:  Independent and Leisure: watching TV, shopping, has a fitness center at her apartment complex  PATIENT GOALS:   Improve function in knee, get back to gym   OBJECTIVE:  Note: Objective measures were completed at Evaluation unless otherwise noted.  PATIENT SURVEYS:  Patient-Specific Activity Scoring Scheme  "0" represents "unable to perform." "10" represents "able  to perform at prior level. 0 1 2 3 4 5 6 7 8 9  10 (Date and Score)   Activity Eval     1. Walking 2    2. Bending the knee 2    3. Extending the knee 2   Score 2    Total score = sum of the activity scores/number of activities Minimum detectable change (90%CI) for average score = 2 points Minimum detectable change (90%CI) for single activity score = 3 points    COGNITIVE STATUS: Within functional limits for tasks assessed   SENSATION: WFL  GAIT: 09/08/23 Distance walked: 150' within clinic Assistive device utilized: Environmental consultant - 4 wheeled Level of assistance: Modified independence Comments: decreased stance on Rt, decr hip/knee flexion on Rt   EDEMA: 09/08/23  Knee joint line: Rt: 71 cm/Lt: 48.5 cm 09/19/23: knee joint line: Rt: 59.5 centimeters   LOWER EXTREMITY ROM:     ROM Right eval Left eval Rt 09/13/23 Rt 09/19/23 Right 09/21/23  Knee flexion A: 21 P: 41 A: 108 A: 48 P: 60 A: 50 P: 60 P: 71*  Knee extension A: 8 (seated LAQ) P: 0 A: 0 A: 10 P: 8 A: 10 P: 6    (Blank rows = not tested)   LOWER EXTREMITY MMT:   09/08/2023 Not formally tested at eval; Rt knee grossly 3-/5  MMT Right 09/28/2023   Hip flexion    Hip extension    Hip abduction    Hip adduction    Hip internal rotation    Hip external rotation    Knee flexion    Knee extension 4/5   Ankle dorsiflexion    Ankle plantarflexion    Ankle inversion    Ankle eversion     (Blank rows = not tested)                   TREATMENT         DATE: 09/28/2023 Therex: Nustep Lvl 5 UE/LE for knee ROM 8 mins  Incline gastroc stretch 30 sec  x 3 bilateral  Seated Rt knee LAQ with end range pause extension and flexion c contralateral leg movement  opposite 2 x 10  Supine heel slide AAROM with clinician assist x 3  Manual Seated Rt knee flexion c distraction with IR mobilization c movement.  Contract /relax stretching into Rt    Neuro Re-ed (balance improvements, coordination) Tandem stance 1 min x 1 bilateral on ground in // bars with occasional HHA Standing Rt knee TKE green band 5 sec hold x 12  TherActivity (to improve transfers, stairs) Leg press double leg 50 lbs x 15 in max tolerated knee flexion.   Vasopneumatic 10 mins medium pressure Rt knee 34 deg in elevation  TREATMENT         DATE: 09/22/2023 Nustep UEs/LEs x 6 min total, pushing seat forward every 2 min Seated heel slide x10 Seated self knee flexion stretch with overpressure 5x10" Seated self knee flexion scooting forward 5x10" Seated hamstring curl 1.5# 2x10 Seated LAQ 1.5# 2x10 PNF contract/relax for knee flexion 5x5" Forward step tap into forward lunge on 4" step 10x5" Heel toe raise x20 Side step tap into side lunge 4" step 10x5" Vaso x 15 min, mod pressure to Rt knee; 34 deg in elevation   TREATMENT         DATE: 09/21/2023 Therapeutic Exercise: Nustep BUEs & BLEs level 7 seat 10 for 3 min, then seat 9 (increase flexion) 3 min, seat 8  2 min Gastroc stretch step heel depression 30 sec 3 reps LAQ RLE with PT end range assist for increased ext and isometric hold higher range 10 reps  Therapeutic Activities Alternate stepping to 4" step for AROM RLE and stance stability in ext 10 reps with BUE //bars Knee flexion stretch with RLE on 8" step anterior weight shift with PT manual assist 5 sec hold 10 reps Supine RLE 12" ball & strap knee flexion (PT manual assist) and leg press / knee ext 10 reps Bridge with PT AA moving right foot closer every 3rd rep during bridge for stretch when lowers pelvis 10 reps total  Manual Therapy PROM with overpressure RLE knee flexion Contract-relax with active knee flexion seated PT manual assist for knee flexion stretch  with heel slide on ball (above)  Modalities Vaso x 10 min, mod pressure to Rt knee; 34 deg in elevation   PATIENT EDUCATION:  Education details: HEP Person educated: Patient Education method: Programmer, multimedia, Facilities manager, and Handouts Education comprehension: verbalized understanding, returned demonstration, and needs further education  HOME EXERCISE PROGRAM: Access Code: [email or web address] URL: [email or web address] Date: 09/08/2023 Prepared by: Casimer Clear  Exercises - Ankle Pumps in Elevation  - 10 x daily - 7 x weekly - 1 sets - 10 reps - Ankle Alphabet in Elevation  - 10 x daily - 7 x weekly - 1 sets - 10 reps - Quad Set  - 5-10 x daily - 7 x weekly - 1 sets - 5-10 reps - 5 sec hold - Seated Knee Flexion Extension AROM   - 5-10 x daily - 7 x weekly - 1 sets - 5-10 reps - 5 sec  hold   ASSESSMENT:  CLINICAL IMPRESSION: Benefits noted by contralateral leg movement opposite to facilitate improved knee flexion tolerance in ROM and manual.  Continued noted impairment in flexion tolerance but slowly improving overall.  Continued skilled PT services indicated at this time.   OBJECTIVE IMPAIRMENTS: Abnormal gait, decreased balance, decreased endurance, decreased knowledge of use of DME, decreased mobility, difficulty walking, decreased ROM, decreased strength, hypomobility, increased edema, increased fascial restrictions, increased muscle spasms, and pain.    GOALS: Goals reviewed with patient? Yes  SHORT TERM GOALS: Target date: 10/06/2023  Independent with initial HEP Goal status: Ongoing  09/21/2023  2.  Rt knee ROM improved to 0-90 for improved mobility Goal status: Ongoing  09/21/2023   LONG TERM GOALS: Target date: 11/03/2023  Independent with final HEP Goal status: Ongoing  09/21/2023  2.  PSFS score improved by 3 points Goal status: Ongoing  09/21/2023  3.  Rt knee AROM improved to 0-100 for improved function and mobility Goal status:  Ongoing  09/21/2023  4.   Report pain < 4/10 with standing and walking for improved function Goal status: Ongoing  09/21/2023  5.  Amb without AD without significant deviations for improved community access Goal status: Ongoing  09/21/2023    PLAN:  PT FREQUENCY: 2-3x/wk  PT DURATION: 8 weeks  PLANNED INTERVENTIONS: [id number]- PT Re-evaluation, 97750- Physical Performance Testing, 97110-Therapeutic exercises, 97530- Therapeutic activity, 97112- Neuromuscular re-education, 97535- Self Care, [id number]- Manual therapy, [phone]- Gait training, [phone]- Aquatic Therapy, [phone]- Electrical stimulation (unattended), 97016- Vasopneumatic device, Patient/Family education, Balance training, Stair training, Taping, Dry Needling, Joint mobilization, DME instructions, Cryotherapy, and Moist heat.  PLAN FOR NEXT SESSION: Mobility gains in manual and in intervention actively.  Strengthening as able.    NEXT MD VISIT: 10/12/23   Bonna Bustard, PT, DPT, OCS, ATC 09/28/23  11:32 AM

## 2023-09-30 ENCOUNTER — Telehealth: Payer: Self-pay | Admitting: Physician Assistant

## 2023-09-30 ENCOUNTER — Ambulatory Visit: Admitting: Physical Therapy

## 2023-09-30 ENCOUNTER — Telehealth: Payer: Self-pay

## 2023-09-30 ENCOUNTER — Encounter: Payer: Self-pay | Admitting: Physical Therapy

## 2023-09-30 ENCOUNTER — Other Ambulatory Visit: Payer: Self-pay | Admitting: Orthopaedic Surgery

## 2023-09-30 DIAGNOSIS — M25561 Pain in right knee: Secondary | ICD-10-CM | POA: Diagnosis not present

## 2023-09-30 DIAGNOSIS — R600 Localized edema: Secondary | ICD-10-CM

## 2023-09-30 DIAGNOSIS — R2689 Other abnormalities of gait and mobility: Secondary | ICD-10-CM | POA: Diagnosis not present

## 2023-09-30 DIAGNOSIS — R2681 Unsteadiness on feet: Secondary | ICD-10-CM | POA: Diagnosis not present

## 2023-09-30 DIAGNOSIS — M25661 Stiffness of right knee, not elsewhere classified: Secondary | ICD-10-CM

## 2023-09-30 MED ORDER — METHOCARBAMOL 500 MG PO TABS: 500.0000 mg | ORAL_TABLET | Freq: Four times a day (QID) | ORAL | 1 refills | Status: DC | PRN

## 2023-09-30 MED ORDER — OXYCODONE HCL 5 MG PO TABS: 5.0000 mg | ORAL_TABLET | Freq: Four times a day (QID) | ORAL | 0 refills | Status: DC | PRN

## 2023-09-30 NOTE — Telephone Encounter (Signed)
 Copied from CRM [id number]. Topic: General - Other >> Sep 30, 2023 10:34 AM Danae Duncans wrote: Reason for CRM: Ernestina Headland from [employer] request pt medications sent to [phone].

## 2023-09-30 NOTE — Telephone Encounter (Signed)
 Pt came in office requesting refill of oxycodone  and methocarbamol . Please send to pharmacy on file. Pt phone number is [phone].

## 2023-09-30 NOTE — Therapy (Signed)
 OUTPATIENT PHYSICAL THERAPY TREATMENT   Patient Name: Leah Hampton MRN: [id number] DOB:1955/07/04, 68 y.o., female Today's Date: 09/30/2023  END OF SESSION:  PT End of Session - 09/30/23 1122     Visit Number 9    Number of Visits 24    Authorization Type [employer] Medicare Dual Complete    Authorization Time Period No authorization needed    Progress Note Due on Visit 10    PT Start Time 1101    PT Stop Time 1144    PT Time Calculation (min) 43 min    Activity Tolerance Patient tolerated treatment well;No increased pain    Behavior During Therapy [facility] for tasks assessed/performed               Past Medical History:  Diagnosis Date   Bronchitis    Chronic bronchitis (HCC)    Cystitis    Hematuria    urologice eval, Dr Clifton Dames   History of chest pain    Hyperbilirubinemia    Hyperlipemia    Hypertelorism    Hypertension    Leukocytosis    Obesity    Osteoarthritis    right knee   Pre-diabetes    Sleep apnea 2012   slight   no CPAP   Vaginal Pap smear, abnormal    Vitamin D  deficiency 02/09/2018   Past Surgical History:  Procedure Laterality Date   CERVICAL POLYPECTOMY  07/15/2020   Procedure: POLYPECTOMY with Myosure;  Surgeon: Lenord Radon, MD;  Location: [facility] OR;  Service: Gynecology;;   COLONOSCOPY WITH PROPOFOL  N/A 03/27/2015   Procedure: COLONOSCOPY WITH PROPOFOL ;  Surgeon: Janel Medford, MD;  Location: [facility] ENDOSCOPY;  Service: Endoscopy;  Laterality: N/A;   COLONOSCOPY WITH PROPOFOL  N/A 10/02/2020   Procedure: COLONOSCOPY WITH PROPOFOL ;  Surgeon: Janel Medford, MD;  Location: [facility] ENDOSCOPY;  Service: Endoscopy;  Laterality: N/A;   COLPOSCOPY N/A 07/15/2020   Procedure: COLPOSCOPY;  Surgeon: Lenord Radon, MD;  Location: [facility] OR;  Service: Gynecology;  Laterality: N/A;   DILATATION & CURETTAGE/HYSTEROSCOPY WITH MYOSURE N/A 01/05/2017   Procedure: DILATATION & CURETTAGE/HYSTEROSCOPY WITH MYOSURE;  Surgeon: Johnn Najjar, MD;  Location:  [facility] ORS;  Service: Gynecology;  Laterality: N/A;  PostMenopausal Bleeding   HYSTEROSCOPY WITH D & C N/A 05/22/2019   Procedure: DILATATION AND CURETTAGE /HYSTEROSCOPY, POLYPECTOMY WITH MYOSURE;  Surgeon: Lenord Radon, MD;  Location: [facility] ORS;  Service: Gynecology;  Laterality: N/A;   HYSTEROSCOPY WITH D & C N/A 07/15/2020   Procedure: DILATATION AND CURETTAGE /HYSTEROSCOPY;  Surgeon: Lenord Radon, MD;  Location: [facility] OR;  Service: Gynecology;  Laterality: N/A;   KNEE ARTHROSCOPY  04/2007   left knee   LAPAROSCOPIC GASTRIC SLEEVE RESECTION     TOTAL KNEE ARTHROPLASTY Left 10/2008   Dr Franz Jacks   TOTAL KNEE ARTHROPLASTY Right 08/26/2023   Procedure: ARTHROPLASTY, KNEE, TOTAL;  Surgeon: Arnie Lao, MD;  Location: [facility] ORS;  Service: Orthopedics;  Laterality: Right;   UPPER GI ENDOSCOPY N/A 10/14/2020   Procedure: UPPER GI ENDOSCOPY;  Surgeon: Aldean Hummingbird, MD;  Location: [facility] ORS;  Service: General;  Laterality: N/A;   Patient Active Problem List   Diagnosis Date Noted   Status post total right knee replacement 08/26/2023   Sleep disturbance 01/21/2023   Lactose intolerance 10/22/2022   S/P gastric sleeve procedure 11/04/2020   Uncomplicated asthma 11/04/2020   Gastroesophageal reflux disease 11/04/2020   Primary osteoarthritis of both hips 09/30/2020   History of total left knee replacement 07/29/2020   Endometrial polyp  07/15/2020   Low grade squamous intraepithelial lesion (LGSIL) on cervical Pap smear 07/07/2020   Upper abdominal pain 08/20/2019   Right hip pain 05/28/2019   Post-menopausal bleeding 05/16/2019   Greater trochanteric pain syndrome of left lower extremity 03/20/2019   Type 2 diabetes mellitus without complication, without long-term current use of insulin  (HCC) 05/18/2018   Vitamin D  deficiency 02/09/2018   Onychomycosis 09/19/2017   Tinea pedis of both feet 09/19/2017   Obstructive chronic bronchitis with exacerbation (HCC) 05/23/2017   Mixed  hyperlipidemia 05/01/2014   Genital herpes 05/23/2012   Stress incontinence 03/04/2012   Unilateral primary osteoarthritis, right knee 10/12/2011   CTS (carpal tunnel syndrome) 07/13/2011   OSA (obstructive sleep apnea) 12/10/2010   Severe obesity (BMI >= 40) (HCC) 07/31/2008   Essential hypertension 07/03/2008    PCP: Dorrene Gaucher, NP  REFERRING PROVIDER: Arnie Lao, MD  REFERRING DIAG: M17.11 (ICD-10-CM) - Unilateral primary osteoarthritis, right knee  Rationale for Evaluation and Treatment: Rehabilitation  THERAPY DIAG:  Acute pain of right knee  Stiffness of right knee, not elsewhere classified  Localized edema  Other abnormalities of gait and mobility  Unsteadiness on feet  ONSET DATE: 08/26/23 (DOS)   SUBJECTIVE:                                                                                                                                                                                           SUBJECTIVE STATEMENT:  Nothing new, knee is feeling a little better. Took the bandages off, it feels good   PERTINENT HISTORY:  besity, HTN, HLD, DM, OA, Lt TKA in 2010, s/p gastric sleeve resection 2022  PAIN:   NPRS scale:  4/10 now, up to 6/10 in the past week  Pain location: Rt knee Pain description: sharp, shooting, aching, tightness- sometimes a coldness Aggravating factors: bending her knee, standing. walking  Relieving factors: medication, ice  PRECAUTIONS:  None  RED FLAGS: None   WEIGHT BEARING RESTRICTIONS:  No  FALLS:  Has patient fallen in last 6 months? No  LIVING ENVIRONMENT: Lives with: lives alone Lives in: House/apartment Stairs: Yes: External: 16 steps; can reach both Has following equipment at home: Walker - 4 wheeled  OCCUPATION:  On disability  PLOF:  Independent and Leisure: watching TV, shopping, has a fitness center at her apartment complex  PATIENT GOALS:  Improve function in knee, get back to  gym   OBJECTIVE:  Note: Objective measures were completed at Evaluation unless otherwise noted.  PATIENT SURVEYS:  Patient-Specific Activity Scoring Scheme  "0" represents "unable to perform." "10" represents "able to perform at prior level. 0 1 2  3 4 5 6 7 8 9 10  (Date and Score)   Activity Eval     1. Walking 2    2. Bending the knee 2    3. Extending the knee 2   Score 2    Total score = sum of the activity scores/number of activities Minimum detectable change (90%CI) for average score = 2 points Minimum detectable change (90%CI) for single activity score = 3 points    COGNITIVE STATUS: Within functional limits for tasks assessed   SENSATION: WFL  GAIT: 09/08/23 Distance walked: 150' within clinic Assistive device utilized: Environmental consultant - 4 wheeled Level of assistance: Modified independence Comments: decreased stance on Rt, decr hip/knee flexion on Rt   EDEMA: 09/08/23  Knee joint line: Rt: 71 cm/Lt: 48.5 cm 09/19/23: knee joint line: Rt: 59.5 centimeters   LOWER EXTREMITY ROM:     ROM Right eval Left eval Rt 09/13/23 Rt 09/19/23 Right 09/21/23 Right 09/30/23  Knee flexion A: 21 P: 41 A: 108 A: 48 P: 60 A: 50 P: 60 P: 71* P 56*  Knee extension A: 8 (seated LAQ) P: 0 A: 0 A: 10 P: 8 A: 10 P: 6     (Blank rows = not tested)   LOWER EXTREMITY MMT:   09/08/2023 Not formally tested at eval; Rt knee grossly 3-/5  MMT Right 09/28/2023   Hip flexion    Hip extension    Hip abduction    Hip adduction    Hip internal rotation    Hip external rotation    Knee flexion    Knee extension 4/5   Ankle dorsiflexion    Ankle plantarflexion    Ankle inversion    Ankle eversion     (Blank rows = not tested)                   TREATMENT         DATE:   09/30/23  Nustep L4, all four extremities x8 minutes for ROM and warmup x8 minutes    SLRs 0# x12 R LE  SAQs over bolster x10 0#   Patella mobs all directions to tolerance Knee extension OP supine Knee  flexion OP supine  Retrograde massage with LEs elevated Tennis ball massage to quad with ice pack on knee and pt performing ankle pumps       09/28/2023 Therex: Nustep Lvl 5 UE/LE for knee ROM 8 mins  Incline gastroc stretch 30 sec  x 3 bilateral  Seated Rt knee LAQ with end range pause extension and flexion c contralateral leg movement opposite 2 x 10  Supine heel slide AAROM with clinician assist x 3  Manual Seated Rt knee flexion c distraction with IR mobilization c movement.  Contract /relax stretching into Rt    Neuro Re-ed (balance improvements, coordination) Tandem stance 1 min x 1 bilateral on ground in // bars with occasional HHA Standing Rt knee TKE green band 5 sec hold x 12  TherActivity (to improve transfers, stairs) Leg press double leg 50 lbs x 15 in max tolerated knee flexion.   Vasopneumatic 10 mins medium pressure Rt knee 34 deg in elevation  TREATMENT         DATE: 09/22/2023 Nustep UEs/LEs x 6 min total, pushing seat forward every 2 min Seated heel slide x10 Seated self knee flexion stretch with overpressure 5x10" Seated self knee flexion scooting forward 5x10" Seated hamstring curl 1.5# 2x10 Seated LAQ 1.5# 2x10 PNF contract/relax for knee flexion 5x5"  Forward step tap into forward lunge on 4" step 10x5" Heel toe raise x20 Side step tap into side lunge 4" step 10x5" Vaso x 15 min, mod pressure to Rt knee; 34 deg in elevation   TREATMENT         DATE: 09/21/2023 Therapeutic Exercise: Nustep BUEs & BLEs level 7 seat 10 for 3 min, then seat 9 (increase flexion) 3 min, seat 8 2 min Gastroc stretch step heel depression 30 sec 3 reps LAQ RLE with PT end range assist for increased ext and isometric hold higher range 10 reps  Therapeutic Activities Alternate stepping to 4" step for AROM RLE and stance stability in ext 10 reps with BUE //bars Knee flexion stretch with RLE on 8" step anterior weight shift with PT manual assist 5 sec hold 10 reps Supine RLE  12" ball & strap knee flexion (PT manual assist) and leg press / knee ext 10 reps Bridge with PT AA moving right foot closer every 3rd rep during bridge for stretch when lowers pelvis 10 reps total  Manual Therapy PROM with overpressure RLE knee flexion Contract-relax with active knee flexion seated PT manual assist for knee flexion stretch with heel slide on ball (above)  Modalities Vaso x 10 min, mod pressure to Rt knee; 34 deg in elevation   PATIENT EDUCATION:  Education details: HEP Person educated: Patient Education method: Programmer, multimedia, Facilities manager, and Handouts Education comprehension: verbalized understanding, returned demonstration, and needs further education  HOME EXERCISE PROGRAM: Access Code: [email or web address] URL: [email or web address] Date: 09/08/2023 Prepared by: Casimer Clear  Exercises - Ankle Pumps in Elevation  - 10 x daily - 7 x weekly - 1 sets - 10 reps - Ankle Alphabet in Elevation  - 10 x daily - 7 x weekly - 1 sets - 10 reps - Quad Set  - 5-10 x daily - 7 x weekly - 1 sets - 5-10 reps - 5 sec hold - Seated Knee Flexion Extension AROM   - 5-10 x daily - 7 x weekly - 1 sets - 5-10 reps - 5 sec  hold   ASSESSMENT:  CLINICAL IMPRESSION:   Pt arrives doing well today, knee is coming along well and her incision is looking good. Continued working on ROM as tolerated, also worked in some strengthening today as well. Tolerated session well, range is coming along slowly but surely.   OBJECTIVE IMPAIRMENTS: Abnormal gait, decreased balance, decreased endurance, decreased knowledge of use of DME, decreased mobility, difficulty walking, decreased ROM, decreased strength, hypomobility, increased edema, increased fascial restrictions, increased muscle spasms, and pain.    GOALS: Goals reviewed with patient? Yes  SHORT TERM GOALS: Target date: 10/06/2023  Independent with initial HEP Goal status: Ongoing  09/21/2023  2.  Rt knee ROM improved to 0-90  for improved mobility Goal status: Ongoing  09/21/2023   LONG TERM GOALS: Target date: 11/03/2023  Independent with final HEP Goal status: Ongoing  09/21/2023  2.  PSFS score improved by 3 points Goal status: Ongoing  09/21/2023  3.  Rt knee AROM improved to 0-100 for improved function and mobility Goal status:  Ongoing  09/21/2023  4.  Report pain < 4/10 with standing and walking for improved function Goal status: Ongoing  09/21/2023  5.  Amb without AD without significant deviations for improved community access Goal status: Ongoing  09/21/2023    PLAN:  PT FREQUENCY: 2-3x/wk  PT DURATION: 8 weeks  PLANNED INTERVENTIONS: [id number]- PT Re-evaluation, 97750- Physical Performance Testing, 97110-Therapeutic exercises, 97530- Therapeutic  activity, [id number]- Neuromuscular re-education, [phone]- Self Care, [id number]- Manual therapy, [phone]- Gait training, [phone]- Aquatic Therapy, [phone]- Electrical stimulation (unattended), [phone]- Vasopneumatic device, Patient/Family education, Balance training, Stair training, Taping, Dry Needling, Joint mobilization, DME instructions, Cryotherapy, and Moist heat.  PLAN FOR NEXT SESSION: Mobility gains in manual and in intervention actively.  Strengthening as able/tolerated, 10th visit progress note next time    NEXT MD VISIT: 10/12/23   Terrel Ferries, PT, DPT 09/30/23 11:56 AM

## 2023-10-03 ENCOUNTER — Other Ambulatory Visit: Payer: Self-pay | Admitting: Family

## 2023-10-03 ENCOUNTER — Encounter: Payer: Self-pay | Admitting: Physical Therapy

## 2023-10-03 ENCOUNTER — Ambulatory Visit: Admitting: Physical Therapy

## 2023-10-03 DIAGNOSIS — M25561 Pain in right knee: Secondary | ICD-10-CM | POA: Diagnosis not present

## 2023-10-03 DIAGNOSIS — M25661 Stiffness of right knee, not elsewhere classified: Secondary | ICD-10-CM | POA: Diagnosis not present

## 2023-10-03 DIAGNOSIS — R600 Localized edema: Secondary | ICD-10-CM | POA: Diagnosis not present

## 2023-10-03 DIAGNOSIS — R2689 Other abnormalities of gait and mobility: Secondary | ICD-10-CM | POA: Diagnosis not present

## 2023-10-03 DIAGNOSIS — E119 Type 2 diabetes mellitus without complications: Secondary | ICD-10-CM

## 2023-10-03 NOTE — Therapy (Signed)
 OUTPATIENT PHYSICAL THERAPY TREATMENT PROGRESS NOTE    Patient Name: Leah Hampton MRN: [id number] DOB:[birth year], 68 y.o., female Today's Date: 10/03/2023  Progress Note Reporting Period 09/08/23 to 10/03/2023   See note below for Objective Data and Assessment of Progress/Goals.      END OF SESSION:  PT End of Session - 10/03/23 1220     Visit Number 10    Number of Visits 24    Date for PT Re-Evaluation 11/03/23    Authorization Type [employer] Medicare Dual Complete    Authorization Time Period No authorization needed    Progress Note Due on Visit 20    PT Start Time 1145    PT Stop Time 1230    PT Time Calculation (min) 45 min    Activity Tolerance Patient tolerated treatment well;No increased pain    Behavior During Therapy [facility] for tasks assessed/performed                Past Medical History:  Diagnosis Date   Bronchitis    Chronic bronchitis (HCC)    Cystitis    Hematuria    urologice eval, Dr Clifton Dames   History of chest pain    Hyperbilirubinemia    Hyperlipemia    Hypertelorism    Hypertension    Leukocytosis    Obesity    Osteoarthritis    right knee   Pre-diabetes    Sleep apnea 2012   slight   no CPAP   Vaginal Pap smear, abnormal    Vitamin D  deficiency 02/09/2018   Past Surgical History:  Procedure Laterality Date   CERVICAL POLYPECTOMY  07/15/2020   Procedure: POLYPECTOMY with Myosure;  Surgeon: Lenord Radon, MD;  Location: [facility] OR;  Service: Gynecology;;   COLONOSCOPY WITH PROPOFOL  N/A 03/27/2015   Procedure: COLONOSCOPY WITH PROPOFOL ;  Surgeon: Janel Medford, MD;  Location: [facility] ENDOSCOPY;  Service: Endoscopy;  Laterality: N/A;   COLONOSCOPY WITH PROPOFOL  N/A 10/02/2020   Procedure: COLONOSCOPY WITH PROPOFOL ;  Surgeon: Janel Medford, MD;  Location: [facility];  Service: Endoscopy;  Laterality: N/A;   COLPOSCOPY N/A 07/15/2020   Procedure: COLPOSCOPY;  Surgeon: Lenord Radon, MD;  Location: [facility] OR;  Service:  Gynecology;  Laterality: N/A;   DILATATION & CURETTAGE/HYSTEROSCOPY WITH MYOSURE N/A 01/05/2017   Procedure: DILATATION & CURETTAGE/HYSTEROSCOPY WITH MYOSURE;  Surgeon: Johnn Najjar, MD;  Location: [facility] ORS;  Service: Gynecology;  Laterality: N/A;  PostMenopausal Bleeding   HYSTEROSCOPY WITH D & C N/A 05/22/2019   Procedure: DILATATION AND CURETTAGE /HYSTEROSCOPY, POLYPECTOMY WITH MYOSURE;  Surgeon: Lenord Radon, MD;  Location: [facility] ORS;  Service: Gynecology;  Laterality: N/A;   HYSTEROSCOPY WITH D & C N/A 07/15/2020   Procedure: DILATATION AND CURETTAGE /HYSTEROSCOPY;  Surgeon: Lenord Radon, MD;  Location: [facility] OR;  Service: Gynecology;  Laterality: N/A;   KNEE ARTHROSCOPY  04/2007   left knee   LAPAROSCOPIC GASTRIC SLEEVE RESECTION     TOTAL KNEE ARTHROPLASTY Left 10/2008   Dr Franz Jacks   TOTAL KNEE ARTHROPLASTY Right 08/26/2023   Procedure: ARTHROPLASTY, KNEE, TOTAL;  Surgeon: Arnie Lao, MD;  Location: [facility] OR[facility];  Service: Orthopedics;  Laterality: Right;   UPPER GI ENDOSCOPY N/A 10/14/2020   Procedure: UPPER GI ENDOSCOPY;  Surgeon: Aldean Hummingbird, MD;  Location: [facility] ORS;  Service: General;  Laterality: N/A;   Patient Active Problem List   Diagnosis Date Noted   Status post total right knee replacement 08/26/2023   Sleep disturbance 01/21/2023   Lactose intolerance 10/22/2022   S/P  gastric sleeve procedure 11/04/2020   Uncomplicated asthma 11/04/2020   Gastroesophageal reflux disease 11/04/2020   Primary osteoarthritis of both hips 09/30/2020   History of total left knee replacement 07/29/2020   Endometrial polyp 07/15/2020   Low grade squamous intraepithelial lesion (LGSIL) on cervical Pap smear 07/07/2020   Upper abdominal pain 08/20/2019   Right hip pain 05/28/2019   Post-menopausal bleeding 05/16/2019   Greater trochanteric pain syndrome of left lower extremity 03/20/2019   Type 2 diabetes mellitus without complication, without long-term current use of  insulin  (HCC) 05/18/2018   Vitamin D  deficiency 02/09/2018   Onychomycosis 09/19/2017   Tinea pedis of both feet 09/19/2017   Obstructive chronic bronchitis with exacerbation (HCC) 05/23/2017   Mixed hyperlipidemia 05/01/2014   Genital herpes 05/23/2012   Stress incontinence 03/04/2012   Unilateral primary osteoarthritis, right knee 10/12/2011   CTS (carpal tunnel syndrome) 07/13/2011   OSA (obstructive sleep apnea) 12/10/2010   Severe obesity (BMI >= 40) (HCC) 07/31/2008   Essential hypertension 07/03/2008    PCP: Dorrene Gaucher, NP  REFERRING PROVIDER: Arnie Lao, MD  REFERRING DIAG: M17.11 (ICD-10-CM) - Unilateral primary osteoarthritis, right knee  Rationale for Evaluation and Treatment: Rehabilitation  THERAPY DIAG:  Acute pain of right knee  Stiffness of right knee, not elsewhere classified  Localized edema  Other abnormalities of gait and mobility  ONSET DATE: 08/26/23 (DOS)   SUBJECTIVE:                                                                                                                                                                                           SUBJECTIVE STATEMENT: Pt reporting 4/10 pain upon arrival. Pt stating she needed pain meds this morning around 4:00 and also complaining of itching.   PERTINENT HISTORY:  besity, HTN, HLD, DM, OA, Lt TKA in 2010, s/p gastric sleeve resection 2022  PAIN:   NPRS scale:  4/10 now, worse pain 5/10 Pain location: Rt knee Pain description: sharp, shooting, aching, tightness- sometimes a coldness Aggravating factors: bending her knee, standing. walking  Relieving factors: medication, ice  PRECAUTIONS:  None  RED FLAGS: None   WEIGHT BEARING RESTRICTIONS:  No  FALLS:  Has patient fallen in last 6 months? No  LIVING ENVIRONMENT: Lives with: lives alone Lives in: House/apartment Stairs: Yes: External: 16 steps; can reach both Has following equipment at home: Walker - 4  wheeled  OCCUPATION:  On disability  PLOF:  Independent and Leisure: watching TV, shopping, has a fitness center at her apartment complex  PATIENT GOALS:  Improve function in knee, get back to gym   OBJECTIVE:  Note: Objective measures  were completed at Evaluation unless otherwise noted.  PATIENT SURVEYS:  Patient-Specific Activity Scoring Scheme  "0" represents "unable to perform." "10" represents "able to perform at prior level. 0 1 2 3 4 5 6 7 8 9  10 (Date and Score)   Activity Eval     1. Walking 2    2. Bending the knee 2    3. Extending the knee 2   Score 2    Total score = sum of the activity scores/number of activities Minimum detectable change (90%CI) for average score = 2 points Minimum detectable change (90%CI) for single activity score = 3 points    COGNITIVE STATUS: Within functional limits for tasks assessed   SENSATION: WFL  GAIT: 09/08/23 Distance walked: 150' within clinic Assistive device utilized: Environmental consultant - 4 wheeled Level of assistance: Modified independence Comments: decreased stance on Rt, decr hip/knee flexion on Rt   EDEMA: 09/08/23  Knee joint line: Rt: 71 cm/Lt: 48.5 cm 09/19/23: knee joint line: Rt: 59.5 centimeters   LOWER EXTREMITY ROM:     ROM Right eval Left eval Rt 09/13/23 Rt 09/19/23 Right 09/21/23 Right 09/30/23 Rt 10/03/23  Knee flexion A: 21 P: 41 A: 108 A: 48 P: 60 A: 50 P: 60 P: 71* P 56* AA: 50 P: 58  Knee extension A: 8 (seated LAQ) P: 0 A: 0 A: 10 P: 8 A: 10 P: 6   A: -7 P: -5   (Blank rows = not tested)   LOWER EXTREMITY MMT:   09/08/2023 Not formally tested at eval; Rt knee grossly 3-/5  MMT Right 09/28/2023 Rt 10/03/23 Seated HHD  Hip flexion    Hip extension    Hip abduction    Hip adduction    Hip internal rotation    Hip external rotation    Knee flexion  18.7 22.4  Knee extension 4/5 20.5 21.3  Ankle dorsiflexion    Ankle plantarflexion    Ankle inversion    Ankle eversion     (Blank  rows = not tested)                  Today's Treatment TherEx Recumbent bike: partial revolutions x 5 minutes, pt made full revolution and reported too much pain and wished to get off the bike (seat at 6) Standing in parallel bars: hamstring curls 2 x 10  Calf raises x 10 in parallel bars Seated SLR 2 x 10  LAQ: 3# 2 x 10 Rt LE TherAct Step ups on 4 inch step:  x 10 bil LEs Step taps x 20 alternating in parallel bars Manual:  Contract/Relax opposite LE reciprocal motion x 10 holding 5 sec each PROM flexion and extension with overpressure Modalities Vasopneumatic medium compression, Rt LE elevated on bolster, x 10 minutes     TREATMENT         DATE:   09/30/23  Nustep L4, all four extremities x8 minutes for ROM and warmup x8 minutes    SLRs 0# x12 R LE  SAQs over bolster x10 0#   Patella mobs all directions to tolerance Knee extension OP supine Knee flexion OP supine  Retrograde massage with LEs elevated Tennis ball massage to quad with ice pack on knee and pt performing ankle pumps       09/28/2023 Therex: Nustep Lvl 5 UE/LE for knee ROM 8 mins  Incline gastroc stretch 30 sec  x 3 bilateral  Seated Rt knee LAQ with end range pause extension and flexion c  contralateral leg movement opposite 2 x 10  Supine heel slide AAROM with clinician assist x 3  Manual Seated Rt knee flexion c distraction with IR mobilization c movement.  Contract /relax stretching into Rt    Neuro Re-ed (balance improvements, coordination) Tandem stance 1 min x 1 bilateral on ground in // bars with occasional HHA Standing Rt knee TKE green band 5 sec hold x 12  TherActivity (to improve transfers, stairs) Leg press double leg 50 lbs x 15 in max tolerated knee flexion.   Vasopneumatic 10 mins medium pressure Rt knee 34 deg in elevation  TREATMENT         DATE: 09/22/2023 Nustep UEs/LEs x 6 min total, pushing seat forward every 2 min Seated heel slide x10 Seated self knee flexion stretch  with overpressure 5x10" Seated self knee flexion scooting forward 5x10" Seated hamstring curl 1.5# 2x10 Seated LAQ 1.5# 2x10 PNF contract/relax for knee flexion 5x5" Forward step tap into forward lunge on 4" step 10x5" Heel toe raise x20 Side step tap into side lunge 4" step 10x5" Vaso x 15 min, mod pressure to Rt knee; 34 deg in elevation   TREATMENT         DATE: 09/21/2023 Therapeutic Exercise: Nustep BUEs & BLEs level 7 seat 10 for 3 min, then seat 9 (increase flexion) 3 min, seat 8 2 min Gastroc stretch step heel depression 30 sec 3 reps LAQ RLE with PT end range assist for increased ext and isometric hold higher range 10 reps  Therapeutic Activities Alternate stepping to 4" step for AROM RLE and stance stability in ext 10 reps with BUE //bars Knee flexion stretch with RLE on 8" step anterior weight shift with PT manual assist 5 sec hold 10 reps Supine RLE 12" ball & strap knee flexion (PT manual assist) and leg press / knee ext 10 reps Bridge with PT AA moving right foot closer every 3rd rep during bridge for stretch when lowers pelvis 10 reps total  Manual Therapy PROM with overpressure RLE knee flexion Contract-relax with active knee flexion seated PT manual assist for knee flexion stretch with heel slide on ball (above)  Modalities Vaso x 10 min, mod pressure to Rt knee; 34 deg in elevation   PATIENT EDUCATION:  Education details: HEP Person educated: Patient Education method: Programmer, multimedia, Facilities manager, and Handouts Education comprehension: verbalized understanding, returned demonstration, and needs further education  HOME EXERCISE PROGRAM: Access Code: [email or web address] URL: [email or web address] Date: 09/08/2023 Prepared by: Casimer Clear  Exercises - Ankle Pumps in Elevation  - 10 x daily - 7 x weekly - 1 sets - 10 reps - Ankle Alphabet in Elevation  - 10 x daily - 7 x weekly - 1 sets - 10 reps - Quad Set  - 5-10 x daily - 7 x weekly - 1 sets - 5-10  reps - 5 sec hold - Seated Knee Flexion Extension AROM   - 5-10 x daily - 7 x weekly - 1 sets - 5-10 reps - 5 sec  hold   ASSESSMENT:  CLINICAL IMPRESSION: Pt arriving today reporting 4/10 pain in her Rt knee. Pt's treatment session still limited by pain. Pt's MMT updated using HHD (see chart above) as well as pt's ROM (see chart above). Pt's current passive ROM arc is from 5 to 58 degrees. Pt was strongly encouraged to push herself through her pain at home to progress her Rt knee ROM. Recommending continued skilled PT interventions.    OBJECTIVE IMPAIRMENTS: Abnormal gait, decreased balance,  decreased endurance, decreased knowledge of use of DME, decreased mobility, difficulty walking, decreased ROM, decreased strength, hypomobility, increased edema, increased fascial restrictions, increased muscle spasms, and pain.    GOALS: Goals reviewed with patient? Yes  SHORT TERM GOALS: Target date: 10/06/2023  Independent with initial HEP Goal status: MET 10/03/23  2.  Rt knee ROM improved to 0-90 for improved mobility Goal status: Ongoing 10/03/23   LONG TERM GOALS: Target date: 11/03/2023  Independent with final HEP Goal status: Ongoing  10/03/2023  2.  PSFS score improved by 3 points Goal status: Ongoing  10/03/2023  3.  Rt knee AROM improved to 0-100 for improved function and mobility Goal status:  Ongoing  10/03/2023  4.  Report pain < 4/10 with standing and walking for improved function Goal status: Ongoing  10/03/2023  5.  Amb without AD without significant deviations for improved community access Goal status: Ongoing  10/03/2023    PLAN:  PT FREQUENCY: 2-3x/wk  PT DURATION: 8 weeks  PLANNED INTERVENTIONS: 97164- PT Re-evaluation, 97750- Physical Performance Testing, 97110-Therapeutic exercises, 97530- Therapeutic activity, 97112- Neuromuscular re-education, 97535- Self Care, 91478- Manual therapy, [phone]- Gait training, [phone]- Aquatic Therapy, [phone]- Electrical stimulation  (unattended), 97016- Vasopneumatic device, Patient/Family education, Balance training, Stair training, Taping, Dry Needling, Joint mobilization, DME instructions, Cryotherapy, and Moist heat.  PLAN FOR NEXT SESSION: Mobility gains in manual and in intervention actively.  Strengthening as able/tolerated,    NEXT MD VISIT: 10/12/23   Jerrel Mor, PT, MPT 10/03/23 12:30 PM   10/03/23 12:30 PM

## 2023-10-03 NOTE — Telephone Encounter (Signed)
 Last Fill: Albuterol : 06/19/23     Qvar : 06/19/23     Mounjaro : 08/23/23     Losartan : 06/19/23  Last OV: 08/23/23 Next OV: 12/23/23  Routing to provider for review/authorization.

## 2023-10-03 NOTE — Telephone Encounter (Signed)
 Copied from CRM [id number]. Topic: Clinical - Medication Refill >> Oct 03, 2023 10:23 AM Fonda T wrote: Medication:   albuterol  (VENTOLIN  HFA) 108 (90 Base) MCG/ACT inhaler  beclomethasone (QVAR  REDIHALER) 80 MCG/ACT inhaler   tirzepatide  (MOUNJARO ) 7.5 MG/0.5ML Pen  losartan  (COZAAR ) 100 MG tablet  Ernestina Headland from [employer] calling requesting medication refills for the above medications on behalf of the patient.   Ph# [phone]  Fax# [phone]   Agent: Please be advised that Rx refills may take up to 3 business days. We ask that you follow-up with your pharmacy.

## 2023-10-04 ENCOUNTER — Other Ambulatory Visit: Payer: Self-pay

## 2023-10-04 MED ORDER — ALBUTEROL SULFATE HFA 108 (90 BASE) MCG/ACT IN AERS: 2.0000 | INHALATION_SPRAY | Freq: Four times a day (QID) | RESPIRATORY_TRACT | 1 refills | Status: AC | PRN

## 2023-10-04 MED ORDER — PRAVASTATIN SODIUM 80 MG PO TABS
80.0000 mg | ORAL_TABLET | Freq: Every day | ORAL | 2 refills | Status: AC
Start: 2023-10-04 — End: ?

## 2023-10-04 MED ORDER — LOSARTAN POTASSIUM 100 MG PO TABS: 100.0000 mg | ORAL_TABLET | Freq: Every day | ORAL | 1 refills | Status: DC

## 2023-10-04 MED ORDER — AMLODIPINE BESYLATE 5 MG PO TABS
5.0000 mg | ORAL_TABLET | Freq: Every day | ORAL | 2 refills | Status: AC
Start: 2023-10-04 — End: ?

## 2023-10-04 MED ORDER — TIRZEPATIDE 7.5 MG/0.5ML ~~LOC~~ SOAJ: 7.5000 mg | SUBCUTANEOUS | 1 refills | Status: DC

## 2023-10-04 MED ORDER — QVAR REDIHALER 80 MCG/ACT IN AERB: INHALATION_SPRAY | RESPIRATORY_TRACT | 1 refills | Status: DC

## 2023-10-04 MED ORDER — VALACYCLOVIR HCL 1 G PO TABS: 1000.0000 mg | ORAL_TABLET | Freq: Every day | ORAL | 2 refills | Status: DC

## 2023-10-04 NOTE — Telephone Encounter (Signed)
 Confirmed with patient that she does want to switch to [employer]. Prescriptions requested sent to this location

## 2023-10-05 ENCOUNTER — Other Ambulatory Visit: Payer: Self-pay | Admitting: Orthopaedic Surgery

## 2023-10-05 ENCOUNTER — Telehealth: Payer: Self-pay | Admitting: Orthopaedic Surgery

## 2023-10-05 ENCOUNTER — Ambulatory Visit (INDEPENDENT_AMBULATORY_CARE_PROVIDER_SITE_OTHER): Admitting: Physical Therapy

## 2023-10-05 ENCOUNTER — Encounter: Payer: Self-pay | Admitting: Physical Therapy

## 2023-10-05 DIAGNOSIS — R600 Localized edema: Secondary | ICD-10-CM | POA: Diagnosis not present

## 2023-10-05 DIAGNOSIS — R2689 Other abnormalities of gait and mobility: Secondary | ICD-10-CM | POA: Diagnosis not present

## 2023-10-05 DIAGNOSIS — M25561 Pain in right knee: Secondary | ICD-10-CM

## 2023-10-05 DIAGNOSIS — R2681 Unsteadiness on feet: Secondary | ICD-10-CM

## 2023-10-05 DIAGNOSIS — M25661 Stiffness of right knee, not elsewhere classified: Secondary | ICD-10-CM

## 2023-10-05 MED ORDER — OXYCODONE HCL 5 MG PO TABS: 5.0000 mg | ORAL_TABLET | Freq: Four times a day (QID) | ORAL | 0 refills | Status: DC | PRN

## 2023-10-05 MED ORDER — METHOCARBAMOL 500 MG PO TABS: 500.0000 mg | ORAL_TABLET | Freq: Four times a day (QID) | ORAL | 1 refills | Status: DC | PRN

## 2023-10-05 NOTE — Therapy (Signed)
 OUTPATIENT PHYSICAL THERAPY TREATMENT    Patient Name: ASLEE SUCH MRN: [id number] DOB:May 02, 1956, 68 y.o., female Today's Date: 10/05/2023      END OF SESSION:  PT End of Session - 10/05/23 1315     Visit Number 11    Number of Visits 24    Date for PT Re-Evaluation 11/03/23    Authorization Type [employer] Medicare Dual Complete    Authorization Time Period No authorization needed    Progress Note Due on Visit 20    PT Start Time 1314   pt arrived late   PT Stop Time 1350    PT Time Calculation (min) 36 min    Activity Tolerance Patient tolerated treatment well;No increased pain    Behavior During Therapy [facility] for tasks assessed/performed                 Past Medical History:  Diagnosis Date   Bronchitis    Chronic bronchitis (HCC)    Cystitis    Hematuria    urologice eval, Dr Clifton Dames   History of chest pain    Hyperbilirubinemia    Hyperlipemia    Hypertelorism    Hypertension    Leukocytosis    Obesity    Osteoarthritis    right knee   Pre-diabetes    Sleep apnea 2012   slight   no CPAP   Vaginal Pap smear, abnormal    Vitamin D  deficiency 02/09/2018   Past Surgical History:  Procedure Laterality Date   CERVICAL POLYPECTOMY  07/15/2020   Procedure: POLYPECTOMY with Myosure;  Surgeon: Lenord Radon, MD;  Location: [facility] OR;  Service: Gynecology;;   COLONOSCOPY WITH PROPOFOL  N/A 03/27/2015   Procedure: COLONOSCOPY WITH PROPOFOL ;  Surgeon: Janel Medford, MD;  Location: [facility];  Service: Endoscopy;  Laterality: N/A;   COLONOSCOPY WITH PROPOFOL  N/A 10/02/2020   Procedure: COLONOSCOPY WITH PROPOFOL ;  Surgeon: Janel Medford, MD;  Location: [facility];  Service: Endoscopy;  Laterality: N/A;   COLPOSCOPY N/A 07/15/2020   Procedure: COLPOSCOPY;  Surgeon: Lenord Radon, MD;  Location: [facility] OR;  Service: Gynecology;  Laterality: N/A;   DILATATION & CURETTAGE/HYSTEROSCOPY WITH MYOSURE N/A 01/05/2017   Procedure: DILATATION &  CURETTAGE/HYSTEROSCOPY WITH MYOSURE;  Surgeon: Johnn Najjar, MD;  Location: [facility] ORS;  Service: Gynecology;  Laterality: N/A;  PostMenopausal Bleeding   HYSTEROSCOPY WITH D & C N/A 05/22/2019   Procedure: DILATATION AND CURETTAGE /HYSTEROSCOPY, POLYPECTOMY WITH MYOSURE;  Surgeon: Lenord Radon, MD;  Location: [facility] OR[facility];  Service: Gynecology;  Laterality: N/A;   HYSTEROSCOPY WITH D & C N/A 07/15/2020   Procedure: DILATATION AND CURETTAGE /HYSTEROSCOPY;  Surgeon: Lenord Radon, MD;  Location: [facility] OR;  Service: Gynecology;  Laterality: N/A;   KNEE ARTHROSCOPY  04/2007   left knee   LAPAROSCOPIC GASTRIC SLEEVE RESECTION     TOTAL KNEE ARTHROPLASTY Left 10/2008   Dr Franz Jacks   TOTAL KNEE ARTHROPLASTY Right 08/26/2023   Procedure: ARTHROPLASTY, KNEE, TOTAL;  Surgeon: Arnie Lao, MD;  Location: [facility];  Service: Orthopedics;  Laterality: Right;   UPPER GI ENDOSCOPY N/A 10/14/2020   Procedure: UPPER GI ENDOSCOPY;  Surgeon: Aldean Hummingbird, MD;  Location: [facility] OR[facility];  Service: General;  Laterality: N/A;   Patient Active Problem List   Diagnosis Date Noted   Status post total right knee replacement 08/26/2023   Sleep disturbance 01/21/2023   Lactose intolerance 10/22/2022   S/P gastric sleeve procedure 11/04/2020   Uncomplicated asthma 11/04/2020   Gastroesophageal reflux disease 11/04/2020  Primary osteoarthritis of both hips 09/30/2020   History of total left knee replacement 07/29/2020   Endometrial polyp 07/15/2020   Low grade squamous intraepithelial lesion (LGSIL) on cervical Pap smear 07/07/2020   Upper abdominal pain 08/20/2019   Right hip pain 05/28/2019   Post-menopausal bleeding 05/16/2019   Greater trochanteric pain syndrome of left lower extremity 03/20/2019   Type 2 diabetes mellitus without complication, without long-term current use of insulin  (HCC) 05/18/2018   Vitamin D  deficiency 02/09/2018   Onychomycosis 09/19/2017   Tinea pedis of both feet  09/19/2017   Obstructive chronic bronchitis with exacerbation (HCC) 05/23/2017   Mixed hyperlipidemia 05/01/2014   Genital herpes 05/23/2012   Stress incontinence 03/04/2012   Unilateral primary osteoarthritis, right knee 10/12/2011   CTS (carpal tunnel syndrome) 07/13/2011   OSA (obstructive sleep apnea) 12/10/2010   Severe obesity (BMI >= 40) (HCC) 07/31/2008   Essential hypertension 07/03/2008    PCP: Dorrene Gaucher, NP  REFERRING PROVIDER: Arnie Lao, MD  REFERRING DIAG: M17.11 (ICD-10-CM) - Unilateral primary osteoarthritis, right knee  Rationale for Evaluation and Treatment: Rehabilitation  THERAPY DIAG:  Acute pain of right knee  Stiffness of right knee, not elsewhere classified  Localized edema  Other abnormalities of gait and mobility  Unsteadiness on feet  ONSET DATE: 08/26/23 (DOS)   SUBJECTIVE:                                                                                                                                                                                           SUBJECTIVE STATEMENT: Doing pretty well; her driver was late today.  Did not like the recumbent bike last visit.  PERTINENT HISTORY:  besity, HTN, HLD, DM, OA, Lt TKA in 2010, s/p gastric sleeve resection 2022  PAIN:   NPRS scale:  4/10 now, worse pain 5/10 Pain location: Rt knee Pain description: sharp, shooting, aching, tightness- sometimes a coldness Aggravating factors: bending her knee, standing. walking  Relieving factors: medication, ice  PRECAUTIONS:  None  RED FLAGS: None   WEIGHT BEARING RESTRICTIONS:  No  FALLS:  Has patient fallen in last 6 months? No  LIVING ENVIRONMENT: Lives with: lives alone Lives in: House/apartment Stairs: Yes: External: 16 steps; can reach both Has following equipment at home: Walker - 4 wheeled  OCCUPATION:  On disability  PLOF:  Independent and Leisure: watching TV, shopping, has a fitness center at her  apartment complex  PATIENT GOALS:  Improve function in knee, get back to gym   OBJECTIVE:  Note: Objective measures were completed at Evaluation unless otherwise noted.  PATIENT SURVEYS:  Patient-Specific Activity Scoring Scheme  "0" represents "  unable to perform." "10" represents "able to perform at prior level. 0 1 2 3 4 5 6 7 8 9  10 (Date and Score)   Activity Eval     1. Walking 2    2. Bending the knee 2    3. Extending the knee 2   Score 2    Total score = sum of the activity scores/number of activities Minimum detectable change (90%CI) for average score = 2 points Minimum detectable change (90%CI) for single activity score = 3 points    COGNITIVE STATUS: Within functional limits for tasks assessed   SENSATION: WFL  GAIT: 09/08/23 Distance walked: 150' within clinic Assistive device utilized: Environmental consultant - 4 wheeled Level of assistance: Modified independence Comments: decreased stance on Rt, decr hip/knee flexion on Rt   EDEMA: 09/08/23  Knee joint line: Rt: 71 cm/Lt: 48.5 cm 09/19/23: knee joint line: Rt: 59.5 centimeters   LOWER EXTREMITY ROM:     ROM Right eval Left eval Rt 09/13/23 Rt 09/19/23 Right 09/21/23 Right 09/30/23 Rt 10/03/23  Knee flexion A: 21 P: 41 A: 108 A: 48 P: 60 A: 50 P: 60 P: 71* P 56* AA: 50 P: 58  Knee extension A: 8 (seated LAQ) P: 0 A: 0 A: 10 P: 8 A: 10 P: 6   A: -7 P: -5   (Blank rows = not tested)   LOWER EXTREMITY MMT:   09/08/2023 Not formally tested at eval; Rt knee grossly 3-/5  MMT Right 09/28/2023 Rt 10/03/23 Seated HHD  Hip flexion    Hip extension    Hip abduction    Hip adduction    Hip internal rotation    Hip external rotation    Knee flexion  18.7 22.4  Knee extension 4/5 20.5 21.3  Ankle dorsiflexion    Ankle plantarflexion    Ankle inversion    Ankle eversion     (Blank rows = not tested)                  Today's Treatment 10/05/23 TherEx SciFit bike UE/LEs partial revolutions; seat 9 x 8  min; L1 Alternating LAQ with holds at end range x 10 reps bil Seated LAQ on Rt with 4# 2x10; 5 sec hold Seated SLR 2x10 on Rt with 3 sec hold  Manual Contract/Relax opposite LE reciprocal motion x 5 holding 5 sec each PROM flexion and extension with overpressure  Modalities Vasopneumatic medium compression, Rt LE elevated on bolster, x 10 minutes; 34 deg   10/03/23 TherEx Recumbent bike: partial revolutions x 5 minutes, pt made full revolution and reported too much pain and wished to get off the bike (seat at 6) Standing in parallel bars: hamstring curls 2 x 10  Calf raises x 10 in parallel bars Seated SLR 2 x 10  LAQ: 3# 2 x 10 Rt LE TherAct Step ups on 4 inch step:  x 10 bil LEs Step taps x 20 alternating in parallel bars Manual:  Contract/Relax opposite LE reciprocal motion x 10 holding 5 sec each PROM flexion and extension with overpressure Modalities Vasopneumatic medium compression, Rt LE elevated on bolster, x 10 minutes   09/30/23  Nustep L4, all four extremities x8 minutes for ROM and warmup x8 minutes    SLRs 0# x12 R LE  SAQs over bolster x10 0#   Patella mobs all directions to tolerance Knee extension OP supine Knee flexion OP supine  Retrograde massage with LEs elevated Tennis ball massage to quad with ice  pack on knee and pt performing ankle pumps       09/28/2023 Therex: Nustep Lvl 5 UE/LE for knee ROM 8 mins  Incline gastroc stretch 30 sec  x 3 bilateral  Seated Rt knee LAQ with end range pause extension and flexion c contralateral leg movement opposite 2 x 10  Supine heel slide AAROM with clinician assist x 3  Manual Seated Rt knee flexion c distraction with IR mobilization c movement.  Contract /relax stretching into Rt    Neuro Re-ed (balance improvements, coordination) Tandem stance 1 min x 1 bilateral on ground in // bars with occasional HHA Standing Rt knee TKE green band 5 sec hold x 12  TherActivity (to improve transfers,  stairs) Leg press double leg 50 lbs x 15 in max tolerated knee flexion.   Vasopneumatic 10 mins medium pressure Rt knee 34 deg in elevation  09/22/2023 Nustep UEs/LEs x 6 min total, pushing seat forward every 2 min Seated heel slide x10 Seated self knee flexion stretch with overpressure 5x10" Seated self knee flexion scooting forward 5x10" Seated hamstring curl 1.5# 2x10 Seated LAQ 1.5# 2x10 PNF contract/relax for knee flexion 5x5" Forward step tap into forward lunge on 4" step 10x5" Heel toe raise x20 Side step tap into side lunge 4" step 10x5" Vaso x 15 min, mod pressure to Rt knee; 34 deg in elevation   09/21/2023 Therapeutic Exercise: Nustep BUEs & BLEs level 7 seat 10 for 3 min, then seat 9 (increase flexion) 3 min, seat 8 2 min Gastroc stretch step heel depression 30 sec 3 reps LAQ RLE with PT end range assist for increased ext and isometric hold higher range 10 reps  Therapeutic Activities Alternate stepping to 4" step for AROM RLE and stance stability in ext 10 reps with BUE //bars Knee flexion stretch with RLE on 8" step anterior weight shift with PT manual assist 5 sec hold 10 reps Supine RLE 12" ball & strap knee flexion (PT manual assist) and leg press / knee ext 10 reps Bridge with PT AA moving right foot closer every 3rd rep during bridge for stretch when lowers pelvis 10 reps total  Manual Therapy PROM with overpressure RLE knee flexion Contract-relax with active knee flexion seated PT manual assist for knee flexion stretch with heel slide on ball (above)  Modalities Vaso x 10 min, mod pressure to Rt knee; 34 deg in elevation   PATIENT EDUCATION:  Education details: HEP Person educated: Patient Education method: Programmer, multimedia, Facilities manager, and Handouts Education comprehension: verbalized understanding, returned demonstration, and needs further education  HOME EXERCISE PROGRAM: Access Code: [email or web address] URL: [email or web address] Date:  09/08/2023 Prepared by: Casimer Clear  Exercises - Ankle Pumps in Elevation  - 10 x daily - 7 x weekly - 1 sets - 10 reps - Ankle Alphabet in Elevation  - 10 x daily - 7 x weekly - 1 sets - 10 reps - Quad Set  - 5-10 x daily - 7 x weekly - 1 sets - 5-10 reps - 5 sec hold - Seated Knee Flexion Extension AROM   - 5-10 x daily - 7 x weekly - 1 sets - 5-10 reps - 5 sec  hold   ASSESSMENT:  CLINICAL IMPRESSION: Pt tolerated session well today with continued focus on maximizing ROM as well as edema management.  Will continue to benefit from PT to maximize function.   OBJECTIVE IMPAIRMENTS: Abnormal gait, decreased balance, decreased endurance, decreased knowledge of use of DME, decreased mobility, difficulty walking, decreased ROM,  decreased strength, hypomobility, increased edema, increased fascial restrictions, increased muscle spasms, and pain.    GOALS: Goals reviewed with patient? Yes  SHORT TERM GOALS: Target date: 10/06/2023  Independent with initial HEP Goal status: MET 10/03/23  2.  Rt knee ROM improved to 0-90 for improved mobility Goal status: Ongoing 10/03/23   LONG TERM GOALS: Target date: 11/03/2023  Independent with final HEP Goal status: Ongoing  10/03/2023  2.  PSFS score improved by 3 points Goal status: Ongoing  10/03/2023  3.  Rt knee AROM improved to 0-100 for improved function and mobility Goal status:  Ongoing  10/03/2023  4.  Report pain < 4/10 with standing and walking for improved function Goal status: Ongoing  10/03/2023  5.  Amb without AD without significant deviations for improved community access Goal status: Ongoing  10/03/2023    PLAN:  PT FREQUENCY: 2-3x/wk  PT DURATION: 8 weeks  PLANNED INTERVENTIONS: 97164- PT Re-evaluation, 97750- Physical Performance Testing, 97110-Therapeutic exercises, 97530- Therapeutic activity, 97112- Neuromuscular re-education, 97535- Self Care, 16109- Manual therapy, [phone]- Gait training, [phone]- Aquatic  Therapy, [phone]- Electrical stimulation (unattended), 97016- Vasopneumatic device, Patient/Family education, Balance training, Stair training, Taping, Dry Needling, Joint mobilization, DME instructions, Cryotherapy, and Moist heat.  PLAN FOR NEXT SESSION: will need MD note,  Mobility gains in manual and in intervention actively.  Strengthening as able/tolerated,    NEXT MD VISIT: 10/12/23   Marley Simmers, PT, DPT 10/05/23 2:20 PM

## 2023-10-05 NOTE — Telephone Encounter (Signed)
 Pt requesting a phone call regarding a taxi voucher previously discussed.

## 2023-10-05 NOTE — Telephone Encounter (Signed)
 Pt requesting medication refill. Oxycodone  and Methocarbamol 

## 2023-10-07 ENCOUNTER — Ambulatory Visit (INDEPENDENT_AMBULATORY_CARE_PROVIDER_SITE_OTHER): Admitting: Physical Therapy

## 2023-10-07 DIAGNOSIS — R600 Localized edema: Secondary | ICD-10-CM

## 2023-10-07 DIAGNOSIS — M25561 Pain in right knee: Secondary | ICD-10-CM | POA: Diagnosis not present

## 2023-10-07 DIAGNOSIS — R2681 Unsteadiness on feet: Secondary | ICD-10-CM

## 2023-10-07 DIAGNOSIS — R2689 Other abnormalities of gait and mobility: Secondary | ICD-10-CM

## 2023-10-07 DIAGNOSIS — M25661 Stiffness of right knee, not elsewhere classified: Secondary | ICD-10-CM

## 2023-10-07 NOTE — Therapy (Signed)
 OUTPATIENT PHYSICAL THERAPY TREATMENT    Patient Name: Leah Hampton MRN: [id number] DOB:03-23-1956, 68 y.o., female Today's Date: 10/07/2023   END OF SESSION:  PT End of Session - 10/07/23 1308     Visit Number 12    Number of Visits 24    Date for PT Re-Evaluation 11/03/23    Authorization Type [employer] Medicare Dual Complete    Authorization Time Period No authorization needed    Progress Note Due on Visit 20    PT Start Time 1308   late arrival   PT Stop Time 1340    PT Time Calculation (min) 32 min    Activity Tolerance Patient tolerated treatment well;No increased pain    Behavior During Therapy [facility] for tasks assessed/performed                  Past Medical History:  Diagnosis Date   Bronchitis    Chronic bronchitis (HCC)    Cystitis    Hematuria    urologice eval, Dr Clifton Dames   History of chest pain    Hyperbilirubinemia    Hyperlipemia    Hypertelorism    Hypertension    Leukocytosis    Obesity    Osteoarthritis    right knee   Pre-diabetes    Sleep apnea 2012   slight   no CPAP   Vaginal Pap smear, abnormal    Vitamin D  deficiency 02/09/2018   Past Surgical History:  Procedure Laterality Date   CERVICAL POLYPECTOMY  07/15/2020   Procedure: POLYPECTOMY with Myosure;  Surgeon: Lenord Radon, MD;  Location: [facility] OR;  Service: Gynecology;;   COLONOSCOPY WITH PROPOFOL  N/A 03/27/2015   Procedure: COLONOSCOPY WITH PROPOFOL ;  Surgeon: Janel Medford, MD;  Location: [facility] ENDOSCOPY;  Service: Endoscopy;  Laterality: N/A;   COLONOSCOPY WITH PROPOFOL  N/A 10/02/2020   Procedure: COLONOSCOPY WITH PROPOFOL ;  Surgeon: Janel Medford, MD;  Location: [facility] ENDOSCOPY;  Service: Endoscopy;  Laterality: N/A;   COLPOSCOPY N/A 07/15/2020   Procedure: COLPOSCOPY;  Surgeon: Lenord Radon, MD;  Location: [facility] OR;  Service: Gynecology;  Laterality: N/A;   DILATATION & CURETTAGE/HYSTEROSCOPY WITH MYOSURE N/A 01/05/2017   Procedure: DILATATION &  CURETTAGE/HYSTEROSCOPY WITH MYOSURE;  Surgeon: Johnn Najjar, MD;  Location: [facility] ORS;  Service: Gynecology;  Laterality: N/A;  PostMenopausal Bleeding   HYSTEROSCOPY WITH D & C N/A 05/22/2019   Procedure: DILATATION AND CURETTAGE /HYSTEROSCOPY, POLYPECTOMY WITH MYOSURE;  Surgeon: Lenord Radon, MD;  Location: [facility] ORS;  Service: Gynecology;  Laterality: N/A;   HYSTEROSCOPY WITH D & C N/A 07/15/2020   Procedure: DILATATION AND CURETTAGE /HYSTEROSCOPY;  Surgeon: Lenord Radon, MD;  Location: [facility] OR;  Service: Gynecology;  Laterality: N/A;   KNEE ARTHROSCOPY  04/2007   left knee   LAPAROSCOPIC GASTRIC SLEEVE RESECTION     TOTAL KNEE ARTHROPLASTY Left 10/2008   Dr Franz Jacks   TOTAL KNEE ARTHROPLASTY Right 08/26/2023   Procedure: ARTHROPLASTY, KNEE, TOTAL;  Surgeon: Arnie Lao, MD;  Location: [facility] OR[facility];  Service: Orthopedics;  Laterality: Right;   UPPER GI ENDOSCOPY N/A 10/14/2020   Procedure: UPPER GI ENDOSCOPY;  Surgeon: Aldean Hummingbird, MD;  Location: [facility] ORS;  Service: General;  Laterality: N/A;   Patient Active Problem List   Diagnosis Date Noted   Status post total right knee replacement 08/26/2023   Sleep disturbance 01/21/2023   Lactose intolerance 10/22/2022   S/P gastric sleeve procedure 11/04/2020   Uncomplicated asthma 11/04/2020   Gastroesophageal reflux disease 11/04/2020   Primary osteoarthritis of  both hips 09/30/2020   History of total left knee replacement 07/29/2020   Endometrial polyp 07/15/2020   Low grade squamous intraepithelial lesion (LGSIL) on cervical Pap smear 07/07/2020   Upper abdominal pain 08/20/2019   Right hip pain 05/28/2019   Post-menopausal bleeding 05/16/2019   Greater trochanteric pain syndrome of left lower extremity 03/20/2019   Type 2 diabetes mellitus without complication, without long-term current use of insulin  (HCC) 05/18/2018   Vitamin D  deficiency 02/09/2018   Onychomycosis 09/19/2017   Tinea pedis of both feet  09/19/2017   Obstructive chronic bronchitis with exacerbation (HCC) 05/23/2017   Mixed hyperlipidemia 05/01/2014   Genital herpes 05/23/2012   Stress incontinence 03/04/2012   Unilateral primary osteoarthritis, right knee 10/12/2011   CTS (carpal tunnel syndrome) 07/13/2011   OSA (obstructive sleep apnea) 12/10/2010   Severe obesity (BMI >= 40) (HCC) 07/31/2008   Essential hypertension 07/03/2008    PCP: Dorrene Gaucher, NP  REFERRING PROVIDER: Arnie Lao, MD  REFERRING DIAG: M17.11 (ICD-10-CM) - Unilateral primary osteoarthritis, right knee  Rationale for Evaluation and Treatment: Rehabilitation  THERAPY DIAG:  Acute pain of right knee  Stiffness of right knee, not elsewhere classified  Localized edema  Other abnormalities of gait and mobility  Unsteadiness on feet  ONSET DATE: 08/26/23 (DOS)   SUBJECTIVE:                                                                                                                                                                                           SUBJECTIVE STATEMENT: Pt reports nothing new or different.   PERTINENT HISTORY:  besity, HTN, HLD, DM, OA, Lt TKA in 2010, s/p gastric sleeve resection 2022  PAIN:   NPRS scale:  4/10 now, worse pain 5/10 Pain location: Rt knee Pain description: sharp, shooting, aching, tightness- sometimes a coldness Aggravating factors: bending her knee, standing. walking  Relieving factors: medication, ice  PRECAUTIONS:  None  RED FLAGS: None   WEIGHT BEARING RESTRICTIONS:  No  FALLS:  Has patient fallen in last 6 months? No  LIVING ENVIRONMENT: Lives with: lives alone Lives in: House/apartment Stairs: Yes: External: 16 steps; can reach both Has following equipment at home: Walker - 4 wheeled  OCCUPATION:  On disability  PLOF:  Independent and Leisure: watching TV, shopping, has a fitness center at her apartment complex  PATIENT GOALS:  Improve function in  knee, get back to gym   OBJECTIVE:  Note: Objective measures were completed at Evaluation unless otherwise noted.  PATIENT SURVEYS:  Patient-Specific Activity Scoring Scheme  "0" represents "unable to perform." "10" represents "able to perform at prior level. 0 1  2 3 4 5 6 7 8 9 10  (Date and Score)   Activity Eval     1. Walking 2    2. Bending the knee 2    3. Extending the knee 2   Score 2    Total score = sum of the activity scores/number of activities Minimum detectable change (90%CI) for average score = 2 points Minimum detectable change (90%CI) for single activity score = 3 points    COGNITIVE STATUS: Within functional limits for tasks assessed   SENSATION: WFL  GAIT: 09/08/23 Distance walked: 150' within clinic Assistive device utilized: Environmental consultant - 4 wheeled Level of assistance: Modified independence Comments: decreased stance on Rt, decr hip/knee flexion on Rt   EDEMA: 09/08/23  Knee joint line: Rt: 71 cm/Lt: 48.5 cm 09/19/23: knee joint line: Rt: 59.5 centimeters   LOWER EXTREMITY ROM:     ROM Right eval Left eval Rt 09/13/23 Rt 09/19/23 Right 09/21/23 Right 09/30/23 Rt 10/03/23  Knee flexion A: 21 P: 41 A: 108 A: 48 P: 60 A: 50 P: 60 P: 71* P 56* AA: 50 P: 58  Knee extension A: 8 (seated LAQ) P: 0 A: 0 A: 10 P: 8 A: 10 P: 6   A: -7 P: -5   (Blank rows = not tested)   LOWER EXTREMITY MMT:   09/08/2023 Not formally tested at eval; Rt knee grossly 3-/5  MMT Right 09/28/2023 Rt 10/03/23 Seated HHD  Hip flexion    Hip extension    Hip abduction    Hip adduction    Hip internal rotation    Hip external rotation    Knee flexion  18.7 22.4  Knee extension 4/5 20.5 21.3  Ankle dorsiflexion    Ankle plantarflexion    Ankle inversion    Ankle eversion     (Blank rows = not tested)                  Today's Treatment 10/07/23 Therex Scifit bike L1 UEs/LEs partial revolutions seat 8 x 8 min Seated Knee flexion self stretch 2x10 Supine hip  flexor stretch x30" Supine quad stretch with strap x30" SAQ with strap for terminal knee ext assist 2x10 Supine hamstring stretch x30" Supine SLR 2x10  Theract Stand to sit to elevated bed working on knee flexion with weightbearing 4x5 slowly lowering bed after each set  Manual Contract/Relax opposite LE reciprocal motion x 5 holding 5 sec each PROM flexion and extension with overpressure  Modalities Vasopneumatic medium compression, Rt LE elevated on bolster, x 10 minutes; 34 deg  10/05/23 TherEx SciFit bike UE/LEs partial revolutions; seat 9 x 8 min; L1 Alternating LAQ with holds at end range x 10 reps bil Seated LAQ on Rt with 4# 2x10; 5 sec hold Seated SLR 2x10 on Rt with 3 sec hold  Manual Contract/Relax opposite LE reciprocal motion x 5 holding 5 sec each PROM flexion and extension with overpressure  Modalities Vasopneumatic medium compression, Rt LE elevated on bolster, x 10 minutes; 34 deg   10/03/23 TherEx Recumbent bike: partial revolutions x 5 minutes, pt made full revolution and reported too much pain and wished to get off the bike (seat at 6) Standing in parallel bars: hamstring curls 2 x 10  Calf raises x 10 in parallel bars Seated SLR 2 x 10  LAQ: 3# 2 x 10 Rt LE TherAct Step ups on 4 inch step:  x 10 bil LEs Step taps x 20 alternating in parallel bars Manual:  Contract/Relax opposite LE reciprocal motion x 10 holding 5 sec each PROM flexion and extension with overpressure Modalities Vasopneumatic medium compression, Rt LE elevated on bolster, x 10 minutes   09/30/23  Nustep L4, all four extremities x8 minutes for ROM and warmup x8 minutes    SLRs 0# x12 R LE  SAQs over bolster x10 0#   Patella mobs all directions to tolerance Knee extension OP supine Knee flexion OP supine  Retrograde massage with LEs elevated Tennis ball massage to quad with ice pack on knee and pt performing ankle pumps       09/28/2023 Therex: Nustep Lvl 5 UE/LE for  knee ROM 8 mins  Incline gastroc stretch 30 sec  x 3 bilateral  Seated Rt knee LAQ with end range pause extension and flexion c contralateral leg movement opposite 2 x 10  Supine heel slide AAROM with clinician assist x 3  Manual Seated Rt knee flexion c distraction with IR mobilization c movement.  Contract /relax stretching into Rt   Neuro Re-ed (balance improvements, coordination) Tandem stance 1 min x 1 bilateral on ground in // bars with occasional HHA Standing Rt knee TKE green band 5 sec hold x 12  TherActivity (to improve transfers, stairs) Leg press double leg 50 lbs x 15 in max tolerated knee flexion.   Vasopneumatic 10 mins medium pressure Rt knee 34 deg in elevation  09/22/2023 Nustep UEs/LEs x 6 min total, pushing seat forward every 2 min Seated heel slide x10 Seated self knee flexion stretch with overpressure 5x10" Seated self knee flexion scooting forward 5x10" Seated hamstring curl 1.5# 2x10 Seated LAQ 1.5# 2x10 PNF contract/relax for knee flexion 5x5" Forward step tap into forward lunge on 4" step 10x5" Heel toe raise x20 Side step tap into side lunge 4" step 10x5" Vaso x 15 min, mod pressure to Rt knee; 34 deg in elevation    PATIENT EDUCATION:  Education details: HEP Person educated: Patient Education method: Programmer, multimedia, Facilities manager, and Handouts Education comprehension: verbalized understanding, returned demonstration, and needs further education  HOME EXERCISE PROGRAM: Access Code: [email or web address] URL: [email or web address] Date: 09/08/2023 Prepared by: Casimer Clear  Exercises - Ankle Pumps in Elevation  - 10 x daily - 7 x weekly - 1 sets - 10 reps - Ankle Alphabet in Elevation  - 10 x daily - 7 x weekly - 1 sets - 10 reps - Quad Set  - 5-10 x daily - 7 x weekly - 1 sets - 5-10 reps - 5 sec hold - Seated Knee Flexion Extension AROM   - 5-10 x daily - 7 x weekly - 1 sets - 5-10 reps - 5 sec  hold   ASSESSMENT:  CLINICAL  IMPRESSION: Still working on increasing knee ROM. Knee extension is progressing very well. Able to get full knee extension passively. Flexion remains most limited. Will f/u with Dr. Lucienne Ryder on 10/12/23   OBJECTIVE IMPAIRMENTS: Abnormal gait, decreased balance, decreased endurance, decreased knowledge of use of DME, decreased mobility, difficulty walking, decreased ROM, decreased strength, hypomobility, increased edema, increased fascial restrictions, increased muscle spasms, and pain.    GOALS: Goals reviewed with patient? Yes  SHORT TERM GOALS: Target date: 10/06/2023  Independent with initial HEP Goal status: MET 10/03/23  2.  Rt knee ROM improved to 0-90 for improved mobility Goal status: Ongoing 10/03/23   LONG TERM GOALS: Target date: 11/03/2023  Independent with final HEP Goal status: Ongoing  10/03/2023  2.  PSFS score improved by 3 points Goal status: Ongoing  10/03/2023  3.  Rt knee AROM improved to 0-100 for improved function and mobility Goal status:  Ongoing  10/03/2023  4.  Report pain < 4/10 with standing and walking for improved function Goal status: Ongoing  10/03/2023  5.  Amb without AD without significant deviations for improved community access Goal status: Ongoing  10/03/2023    PLAN:  PT FREQUENCY: 2-3x/wk  PT DURATION: 8 weeks  PLANNED INTERVENTIONS: 97164- PT Re-evaluation, 97750- Physical Performance Testing, 97110-Therapeutic exercises, 97530- Therapeutic activity, 97112- Neuromuscular re-education, 97535- Self Care, 16109- Manual therapy, [phone]- Gait training, [phone]- Aquatic Therapy, [phone]- Electrical stimulation (unattended), 97016- Vasopneumatic device, Patient/Family education, Balance training, Stair training, Taping, Dry Needling, Joint mobilization, DME instructions, Cryotherapy, and Moist heat.  PLAN FOR NEXT SESSION: will need MD note,  Mobility gains in manual and in intervention actively.  Strengthening as able/tolerated,    NEXT MD VISIT:  10/12/23   Raylin Winer April Ma L Adya Wirz, PT, DPT 10/07/23 1:09 PM

## 2023-10-11 ENCOUNTER — Ambulatory Visit (INDEPENDENT_AMBULATORY_CARE_PROVIDER_SITE_OTHER): Admitting: Physical Therapy

## 2023-10-11 ENCOUNTER — Encounter: Payer: Self-pay | Admitting: Physical Therapy

## 2023-10-11 ENCOUNTER — Telehealth: Payer: Self-pay | Admitting: Family

## 2023-10-11 DIAGNOSIS — M25561 Pain in right knee: Secondary | ICD-10-CM | POA: Diagnosis not present

## 2023-10-11 DIAGNOSIS — R2681 Unsteadiness on feet: Secondary | ICD-10-CM | POA: Diagnosis not present

## 2023-10-11 DIAGNOSIS — R2689 Other abnormalities of gait and mobility: Secondary | ICD-10-CM

## 2023-10-11 DIAGNOSIS — M25661 Stiffness of right knee, not elsewhere classified: Secondary | ICD-10-CM

## 2023-10-11 DIAGNOSIS — E119 Type 2 diabetes mellitus without complications: Secondary | ICD-10-CM

## 2023-10-11 DIAGNOSIS — R600 Localized edema: Secondary | ICD-10-CM

## 2023-10-11 NOTE — Telephone Encounter (Unsigned)
 Copied from CRM [id number]. Topic: Clinical - Medication Refill >> Oct 11, 2023 11:52 AM Baldomero Bone wrote: Medication: tirzepatide  (MOUNJARO ) 7.5 MG/0.5ML Pen  Has the patient contacted their pharmacy? Yes (Agent: If no, request that the patient contact the pharmacy for the refill. If patient does not wish to contact the pharmacy document the reason why and proceed with request.) (Agent: If yes, when and what did the pharmacy advise?)Pharmacy called in refill  This is the patient's preferred pharmacy:   [facility] - [place] - [address] [place] [address] Phone: [phone] Fax: [phone]  Is this the correct pharmacy for this prescription? Yes If no, delete pharmacy and type the correct one.   Has the prescription been filled recently? No  Is the patient out of the medication? No  Has the patient been seen for an appointment in the last year OR does the patient have an upcoming appointment? Yes  Can we respond through MyChart? Yes  Agent: Please be advised that Rx refills may take up to 3 business days. We ask that you follow-up with your pharmacy.

## 2023-10-11 NOTE — Therapy (Signed)
 OUTPATIENT PHYSICAL THERAPY TREATMENT    Patient Name: Leah Hampton MRN: [id number] DOB:15-Dec-1955, 68 y.o., female Today's Date: 10/11/2023   END OF SESSION:  PT End of Session - 10/11/23 1350     Visit Number 13    Number of Visits 24    Date for PT Re-Evaluation 11/03/23    Authorization Type [employer] Medicare Dual Complete    Progress Note Due on Visit 20    PT Start Time 1302    PT Stop Time 1355    PT Time Calculation (min) 53 min    Activity Tolerance Patient tolerated treatment well;No increased pain    Behavior During Therapy [facility] for tasks assessed/performed                   Past Medical History:  Diagnosis Date   Bronchitis    Chronic bronchitis (HCC)    Cystitis    Hematuria    urologice eval, Dr Clifton Dames   History of chest pain    Hyperbilirubinemia    Hyperlipemia    Hypertelorism    Hypertension    Leukocytosis    Obesity    Osteoarthritis    right knee   Pre-diabetes    Sleep apnea 2012   slight   no CPAP   Vaginal Pap smear, abnormal    Vitamin D  deficiency 02/09/2018   Past Surgical History:  Procedure Laterality Date   CERVICAL POLYPECTOMY  07/15/2020   Procedure: POLYPECTOMY with Myosure;  Surgeon: Lenord Radon, MD;  Location: [facility] OR;  Service: Gynecology;;   COLONOSCOPY WITH PROPOFOL  N/A 03/27/2015   Procedure: COLONOSCOPY WITH PROPOFOL ;  Surgeon: Janel Medford, MD;  Location: [facility];  Service: Endoscopy;  Laterality: N/A;   COLONOSCOPY WITH PROPOFOL  N/A 10/02/2020   Procedure: COLONOSCOPY WITH PROPOFOL ;  Surgeon: Janel Medford, MD;  Location: [facility];  Service: Endoscopy;  Laterality: N/A;   COLPOSCOPY N/A 07/15/2020   Procedure: COLPOSCOPY;  Surgeon: Lenord Radon, MD;  Location: [facility] OR;  Service: Gynecology;  Laterality: N/A;   DILATATION & CURETTAGE/HYSTEROSCOPY WITH MYOSURE N/A 01/05/2017   Procedure: DILATATION & CURETTAGE/HYSTEROSCOPY WITH MYOSURE;  Surgeon: Johnn Najjar, MD;  Location:  [facility] ORS;  Service: Gynecology;  Laterality: N/A;  PostMenopausal Bleeding   HYSTEROSCOPY WITH D & C N/A 05/22/2019   Procedure: DILATATION AND CURETTAGE /HYSTEROSCOPY, POLYPECTOMY WITH MYOSURE;  Surgeon: Lenord Radon, MD;  Location: [facility];  Service: Gynecology;  Laterality: N/A;   HYSTEROSCOPY WITH D & C N/A 07/15/2020   Procedure: DILATATION AND CURETTAGE /HYSTEROSCOPY;  Surgeon: Lenord Radon, MD;  Location: [facility] OR;  Service: Gynecology;  Laterality: N/A;   KNEE ARTHROSCOPY  04/2007   left knee   LAPAROSCOPIC GASTRIC SLEEVE RESECTION     TOTAL KNEE ARTHROPLASTY Left 10/2008   Dr Franz Jacks   TOTAL KNEE ARTHROPLASTY Right 08/26/2023   Procedure: ARTHROPLASTY, KNEE, TOTAL;  Surgeon: Arnie Lao, MD;  Location: [facility];  Service: Orthopedics;  Laterality: Right;   UPPER GI ENDOSCOPY N/A 10/14/2020   Procedure: UPPER GI ENDOSCOPY;  Surgeon: Aldean Hummingbird, MD;  Location: [facility] OR[facility];  Service: General;  Laterality: N/A;   Patient Active Problem List   Diagnosis Date Noted   Status post total right knee replacement 08/26/2023   Sleep disturbance 01/21/2023   Lactose intolerance 10/22/2022   S/P gastric sleeve procedure 11/04/2020   Uncomplicated asthma 11/04/2020   Gastroesophageal reflux disease 11/04/2020   Primary osteoarthritis of both hips 09/30/2020   History of total left knee replacement  07/29/2020   Endometrial polyp 07/15/2020   Low grade squamous intraepithelial lesion (LGSIL) on cervical Pap smear 07/07/2020   Upper abdominal pain 08/20/2019   Right hip pain 05/28/2019   Post-menopausal bleeding 05/16/2019   Greater trochanteric pain syndrome of left lower extremity 03/20/2019   Type 2 diabetes mellitus without complication, without long-term current use of insulin  (HCC) 05/18/2018   Vitamin D  deficiency 02/09/2018   Onychomycosis 09/19/2017   Tinea pedis of both feet 09/19/2017   Obstructive chronic bronchitis with exacerbation (HCC) 05/23/2017   Mixed  hyperlipidemia 05/01/2014   Genital herpes 05/23/2012   Stress incontinence 03/04/2012   Unilateral primary osteoarthritis, right knee 10/12/2011   CTS (carpal tunnel syndrome) 07/13/2011   OSA (obstructive sleep apnea) 12/10/2010   Severe obesity (BMI >= 40) (HCC) 07/31/2008   Essential hypertension 07/03/2008    PCP: Dorrene Gaucher, NP  REFERRING PROVIDER: Arnie Lao, MD  REFERRING DIAG: M17.11 (ICD-10-CM) - Unilateral primary osteoarthritis, right knee  Rationale for Evaluation and Treatment: Rehabilitation  THERAPY DIAG:  Acute pain of right knee  Stiffness of right knee, not elsewhere classified  Localized edema  Other abnormalities of gait and mobility  Unsteadiness on feet  ONSET DATE: 08/26/23 (DOS)   SUBJECTIVE:                                                                                                                                                                                           SUBJECTIVE STATEMENT: Pt reporting pain of 3/10 upon arrival.   PERTINENT HISTORY:  besity, HTN, HLD, DM, OA, Lt TKA in 2010, s/p gastric sleeve resection 2022  PAIN:   NPRS scale:  3/10 upon arrival, 5/10 at worse Pain location: Rt knee Pain description: sharp, shooting, aching, tightness- sometimes a coldness Aggravating factors: bending her knee, standing. walking  Relieving factors: medication, ice  PRECAUTIONS:  None  RED FLAGS: None   WEIGHT BEARING RESTRICTIONS:  No  FALLS:  Has patient fallen in last 6 months? No  LIVING ENVIRONMENT: Lives with: lives alone Lives in: House/apartment Stairs: Yes: External: 16 steps; can reach both Has following equipment at home: Walker - 4 wheeled  OCCUPATION:  On disability  PLOF:  Independent and Leisure: watching TV, shopping, has a fitness center at her apartment complex  PATIENT GOALS:  Improve function in knee, get back to gym   OBJECTIVE:  Note: Objective measures were  completed at Evaluation unless otherwise noted.  PATIENT SURVEYS:  Patient-Specific Activity Scoring Scheme  "0" represents "unable to perform." "10" represents "able to perform at prior level. 0 1 2 3 4 5 6 7 8 9  10 (  Date and Score)   Activity Eval  10/11/23   1. Walking 2 5   2. Bending the knee 2  5  3. Extending the knee 2 8  Score 2 6   Total score = sum of the activity scores/number of activities Minimum detectable change (90%CI) for average score = 2 points Minimum detectable change (90%CI) for single activity score = 3 points    COGNITIVE STATUS: Within functional limits for tasks assessed   SENSATION: WFL  GAIT: 09/08/23 Distance walked: 150' within clinic Assistive device utilized: Environmental consultant - 4 wheeled Level of assistance: Modified independence Comments: decreased stance on Rt, decr hip/knee flexion on Rt   EDEMA: 09/08/23  Knee joint line: Rt: 71 cm/Lt: 48.5 cm 09/19/23: knee joint line: Rt: 59.5 centimeters   LOWER EXTREMITY ROM:     ROM Right eval Left eval Rt 09/13/23 Rt 09/19/23 Right 09/21/23 Right 09/30/23 Rt 10/03/23 Rt 10/11/23  Knee flexion A: 21 P: 41 A: 108 A: 48 P: 60 A: 50 P: 60 P: 71* P 56* AA: 50 P: 58 AA:58 P: 60  Knee extension A: 8 (seated LAQ) P: 0 A: 0 A: 10 P: 8 A: 10 P: 6   A: -7 P: -5 A: -5 P: -2   (Blank rows = not tested)   LOWER EXTREMITY MMT:   09/08/2023 Not formally tested at eval; Rt knee grossly 3-/5  MMT Right 09/28/2023 Rt 10/03/23 Seated HHD  Hip flexion    Hip extension    Hip abduction    Hip adduction    Hip internal rotation    Hip external rotation    Knee flexion  18.7 22.4  Knee extension 4/5 20.5 21.3  Ankle dorsiflexion    Ankle plantarflexion    Ankle inversion    Ankle eversion     (Blank rows = not tested)                  Today's Treatment  10/11/23 Therex Recumbent bike: partial revolutions seat at 6, bending to pt's tolerance and holding 3 sec x 7 minutes Calf raises x  10 Seated SLR: 2 x 10  Supine quad stretch using heel on the ball and strap to hold end range flexion  Supine hamstring stretch x30" Theract Stand to sit x 10 from standard height chair with UE support intermittently Double leg press: 50# 2 x 10 slow eccentrics, Rt LE only: 25# 2 x 10  Manual Contract/Relax opposite LE reciprocal motion x 5 holding 5 sec each PROM flexion and extension with overpressure Modalities Vasopneumatic medium compression, Rt LE elevated on bolster, x 10 minutes; 34 deg    10/07/23 Therex Scifit bike L1 UEs/LEs partial revolutions seat 8 x 8 min Seated Knee flexion self stretch 2x10 Supine hip flexor stretch x30" Supine quad stretch with strap x30" SAQ with strap for terminal knee ext assist 2x10 Supine hamstring stretch x30" Supine SLR 2x10  Theract Stand to sit to elevated bed working on knee flexion with weightbearing 4x5 slowly lowering bed after each set  Manual Contract/Relax opposite LE reciprocal motion x 5 holding 5 sec each PROM flexion and extension with overpressure  Modalities Vasopneumatic medium compression, Rt LE elevated on bolster, x 10 minutes; 34 deg    10/05/23 TherEx SciFit bike UE/LEs partial revolutions; seat 9 x 8 min; L1 Alternating LAQ with holds at end range x 10 reps bil Seated LAQ on Rt with 4# 2x10; 5 sec hold Seated SLR 2x10 on Rt with  3 sec hold  Manual Contract/Relax opposite LE reciprocal motion x 5 holding 5 sec each PROM flexion and extension with overpressure  Modalities Vasopneumatic medium compression, Rt LE elevated on bolster, x 10 minutes; 34 deg       PATIENT EDUCATION:  Education details: HEP Person educated: Patient Education method: Programmer, multimedia, Facilities manager, and Handouts Education comprehension: verbalized understanding, returned demonstration, and needs further education  HOME EXERCISE PROGRAM: Access Code: [email or web address] URL: [email or web address] Date:  09/08/2023 Prepared by: Casimer Clear  Exercises - Ankle Pumps in Elevation  - 10 x daily - 7 x weekly - 1 sets - 10 reps - Ankle Alphabet in Elevation  - 10 x daily - 7 x weekly - 1 sets - 10 reps - Quad Set  - 5-10 x daily - 7 x weekly - 1 sets - 5-10 reps - 5 sec hold - Seated Knee Flexion Extension AROM   - 5-10 x daily - 7 x weekly - 1 sets - 5-10 reps - 5 sec  hold   ASSESSMENT:  CLINICAL IMPRESSION: Pt has an appointment with Dr. Lucienne Ryder tomorrow. Pt stating she is trying to get ready to go to a family reunion the first weekend in August. Pt was encouraged to push through mild pain at home to progress her knee flexion and extension. Pt's passive ROM arc is from -2 to 60 degrees. Pt still limited by pain with end range flexion. Pt's Patient Specific Functional Score has improved from 2 to 6. Recommending continued skilled PT to maximize pt's function.    OBJECTIVE IMPAIRMENTS: Abnormal gait, decreased balance, decreased endurance, decreased knowledge of use of DME, decreased mobility, difficulty walking, decreased ROM, decreased strength, hypomobility, increased edema, increased fascial restrictions, increased muscle spasms, and pain.    GOALS: Goals reviewed with patient? Yes  SHORT TERM GOALS: Target date: 10/06/2023  Independent with initial HEP Goal status: MET 10/03/23  2.  Rt knee ROM improved to 0-90 for improved mobility Goal status: Ongoing 10/11/23   LONG TERM GOALS: Target date: 11/03/2023  Independent with final HEP Goal status: Ongoing  10/03/2023  2.  PSFS score improved by 3 points Goal status: MET 10/11/23  3.  Rt knee AROM improved to 0-100 for improved function and mobility Goal status:  Ongoing  10/03/2023  4.  Report pain < 4/10 with standing and walking for improved function Goal status: Ongoing  10/03/2023  5.  Amb without AD without significant deviations for improved community access Goal status: Ongoing  10/03/2023    PLAN:  PT  FREQUENCY: 2-3x/wk  PT DURATION: 8 weeks  PLANNED INTERVENTIONS: [id number]- PT Re-evaluation, 97750- Physical Performance Testing, 97110-Therapeutic exercises, 97530- Therapeutic activity, 97112- Neuromuscular re-education, 97535- Self Care, 86578- Manual therapy, [phone]- Gait training, [phone]- Aquatic Therapy, [phone]- Electrical stimulation (unattended), 97016- Vasopneumatic device, Patient/Family education, Balance training, Stair training, Taping, Dry Needling, Joint mobilization, DME instructions, Cryotherapy, and Moist heat.  PLAN FOR NEXT SESSION: will need MD note,  Mobility gains in manual and in intervention actively.  Strengthening as able/tolerated,    NEXT MD VISIT: 10/12/23   Marysue Sola, PT, MPT 10/11/23 1:54 PM

## 2023-10-12 ENCOUNTER — Ambulatory Visit (INDEPENDENT_AMBULATORY_CARE_PROVIDER_SITE_OTHER): Admitting: Orthopaedic Surgery

## 2023-10-12 ENCOUNTER — Encounter: Payer: Self-pay | Admitting: Orthopaedic Surgery

## 2023-10-12 ENCOUNTER — Ambulatory Visit (INDEPENDENT_AMBULATORY_CARE_PROVIDER_SITE_OTHER)

## 2023-10-12 DIAGNOSIS — Z96651 Presence of right artificial knee joint: Secondary | ICD-10-CM | POA: Diagnosis not present

## 2023-10-12 DIAGNOSIS — M24661 Ankylosis, right knee: Secondary | ICD-10-CM | POA: Insufficient documentation

## 2023-10-12 MED ORDER — TIRZEPATIDE 7.5 MG/0.5ML ~~LOC~~ SOAJ
7.5000 mg | SUBCUTANEOUS | 1 refills | Status: DC
Start: 2023-10-12 — End: 2023-12-23

## 2023-10-12 NOTE — Telephone Encounter (Signed)
 Last Fill: 10/04/23  Last OV: 08/23/23 Next OV: 12/23/23   Routing to provider for review/authorization.

## 2023-10-12 NOTE — Progress Notes (Signed)
 The patient is a 69 year old female who is now almost 7 weeks status post a right total knee arthroplasty.  She has been diligently going to physical therapy but they have only been able to flex her to about 60 degrees.  Her left knee flexes better past 90 degrees with a total knee arthroplasty done on that knee remotely.  Her right knee had severe end-stage arthritis with significant deformity.  The surgery was difficult but we were able to get her knee straight.  An AP and lateral of her right knee today shows a well-seated total knee arthroplasty with no complicating features.  Examination of her right knee today shows that is stable on exam but her flexion is significantly limited to only about 60 degrees when I push it hard.  At this point we have recommended a manipulation under anesthesia.  She understands this as well I explained what the surgery involves as well as the risk and benefits of this outpatient procedure.  She will continue therapy before this is done since we are out of the office next week and cannot do this for 2 weeks.  We will then have her in physical therapy continuously after that as well.

## 2023-10-13 ENCOUNTER — Encounter: Payer: Self-pay | Admitting: Physical Therapy

## 2023-10-13 ENCOUNTER — Ambulatory Visit (INDEPENDENT_AMBULATORY_CARE_PROVIDER_SITE_OTHER): Admitting: Physical Therapy

## 2023-10-13 DIAGNOSIS — R2689 Other abnormalities of gait and mobility: Secondary | ICD-10-CM | POA: Diagnosis not present

## 2023-10-13 DIAGNOSIS — M25561 Pain in right knee: Secondary | ICD-10-CM

## 2023-10-13 DIAGNOSIS — M25661 Stiffness of right knee, not elsewhere classified: Secondary | ICD-10-CM

## 2023-10-13 DIAGNOSIS — R600 Localized edema: Secondary | ICD-10-CM

## 2023-10-13 NOTE — Therapy (Signed)
 OUTPATIENT PHYSICAL THERAPY TREATMENT  Patient Name: Leah Hampton MRN: [id number] DOB:August 09, 1955, 68 y.o., female Today's Date: 10/13/2023   END OF SESSION:  PT End of Session - 10/13/23 1103     Visit Number 14    Number of Visits 24    Date for PT Re-Evaluation 11/03/23    Authorization Type [employer] Medicare Dual Complete    Progress Note Due on Visit 20    PT Start Time 1103    PT Stop Time 1142    PT Time Calculation (min) 39 min    Activity Tolerance Patient tolerated treatment well;No increased pain    Behavior During Therapy [facility] for tasks assessed/performed                    Past Medical History:  Diagnosis Date   Bronchitis    Chronic bronchitis (HCC)    Cystitis    Hematuria    urologice eval, Dr Clifton Dames   History of chest pain    Hyperbilirubinemia    Hyperlipemia    Hypertelorism    Hypertension    Leukocytosis    Obesity    Osteoarthritis    right knee   Pre-diabetes    Sleep apnea 2012   slight   no CPAP   Vaginal Pap smear, abnormal    Vitamin D  deficiency 02/09/2018   Past Surgical History:  Procedure Laterality Date   CERVICAL POLYPECTOMY  07/15/2020   Procedure: POLYPECTOMY with Myosure;  Surgeon: Lenord Radon, MD;  Location: [facility] OR;  Service: Gynecology;;   COLONOSCOPY WITH PROPOFOL  N/A 03/27/2015   Procedure: COLONOSCOPY WITH PROPOFOL ;  Surgeon: Janel Medford, MD;  Location: [facility] ENDOSCOPY;  Service: Endoscopy;  Laterality: N/A;   COLONOSCOPY WITH PROPOFOL  N/A 10/02/2020   Procedure: COLONOSCOPY WITH PROPOFOL ;  Surgeon: Janel Medford, MD;  Location: [facility] ENDOSCOPY;  Service: Endoscopy;  Laterality: N/A;   COLPOSCOPY N/A 07/15/2020   Procedure: COLPOSCOPY;  Surgeon: Lenord Radon, MD;  Location: [facility] OR;  Service: Gynecology;  Laterality: N/A;   DILATATION & CURETTAGE/HYSTEROSCOPY WITH MYOSURE N/A 01/05/2017   Procedure: DILATATION & CURETTAGE/HYSTEROSCOPY WITH MYOSURE;  Surgeon: Johnn Najjar, MD;  Location: [facility]  ORS;  Service: Gynecology;  Laterality: N/A;  PostMenopausal Bleeding   HYSTEROSCOPY WITH D & C N/A 05/22/2019   Procedure: DILATATION AND CURETTAGE /HYSTEROSCOPY, POLYPECTOMY WITH MYOSURE;  Surgeon: Lenord Radon, MD;  Location: [facility] ORS;  Service: Gynecology;  Laterality: N/A;   HYSTEROSCOPY WITH D & C N/A 07/15/2020   Procedure: DILATATION AND CURETTAGE /HYSTEROSCOPY;  Surgeon: Lenord Radon, MD;  Location: [facility] OR;  Service: Gynecology;  Laterality: N/A;   KNEE ARTHROSCOPY  04/2007   left knee   LAPAROSCOPIC GASTRIC SLEEVE RESECTION     TOTAL KNEE ARTHROPLASTY Left 10/2008   Dr Franz Jacks   TOTAL KNEE ARTHROPLASTY Right 08/26/2023   Procedure: ARTHROPLASTY, KNEE, TOTAL;  Surgeon: Arnie Lao, MD;  Location: [facility] ORS;  Service: Orthopedics;  Laterality: Right;   UPPER GI ENDOSCOPY N/A 10/14/2020   Procedure: UPPER GI ENDOSCOPY;  Surgeon: Aldean Hummingbird, MD;  Location: [facility] ORS;  Service: General;  Laterality: N/A;   Patient Active Problem List   Diagnosis Date Noted   Arthrofibrosis of knee joint, right 10/12/2023   Status post total right knee replacement 08/26/2023   Sleep disturbance 01/21/2023   Lactose intolerance 10/22/2022   S/P gastric sleeve procedure 11/04/2020   Uncomplicated asthma 11/04/2020   Gastroesophageal reflux disease 11/04/2020   Primary osteoarthritis of both hips 09/30/2020  History of total left knee replacement 07/29/2020   Endometrial polyp 07/15/2020   Low grade squamous intraepithelial lesion (LGSIL) on cervical Pap smear 07/07/2020   Upper abdominal pain 08/20/2019   Right hip pain 05/28/2019   Post-menopausal bleeding 05/16/2019   Greater trochanteric pain syndrome of left lower extremity 03/20/2019   Type 2 diabetes mellitus without complication, without long-term current use of insulin  (HCC) 05/18/2018   Vitamin D  deficiency 02/09/2018   Onychomycosis 09/19/2017   Tinea pedis of both feet 09/19/2017   Obstructive chronic  bronchitis with exacerbation (HCC) 05/23/2017   Mixed hyperlipidemia 05/01/2014   Genital herpes 05/23/2012   Stress incontinence 03/04/2012   Unilateral primary osteoarthritis, right knee 10/12/2011   CTS (carpal tunnel syndrome) 07/13/2011   OSA (obstructive sleep apnea) 12/10/2010   Severe obesity (BMI >= 40) (HCC) 07/31/2008   Essential hypertension 07/03/2008    PCP: Dorrene Gaucher, NP  REFERRING PROVIDER: Arnie Lao, MD  REFERRING DIAG: M17.11 (ICD-10-CM) - Unilateral primary osteoarthritis, right knee  Rationale for Evaluation and Treatment: Rehabilitation  THERAPY DIAG:  Acute pain of right knee  Stiffness of right knee, not elsewhere classified  Localized edema  Other abnormalities of gait and mobility  ONSET DATE: 08/26/23 (DOS)   SUBJECTIVE:                                                                                                                                                                                           SUBJECTIVE STATEMENT: Pt states she saw MD who did recommend an MUA. Plans to get one after next week but not yet scheduled.   PERTINENT HISTORY:  besity, HTN, HLD, DM, OA, Lt TKA in 2010, s/p gastric sleeve resection 2022  PAIN:   NPRS scale:  3/10 upon arrival, 5/10 at worse Pain location: Rt knee Pain description: sharp, shooting, aching, tightness- sometimes a coldness Aggravating factors: bending her knee, standing. walking  Relieving factors: medication, ice  PRECAUTIONS:  None  RED FLAGS: None   WEIGHT BEARING RESTRICTIONS:  No  FALLS:  Has patient fallen in last 6 months? No  LIVING ENVIRONMENT: Lives with: lives alone Lives in: House/apartment Stairs: Yes: External: 16 steps; can reach both Has following equipment at home: Walker - 4 wheeled  OCCUPATION:  On disability  PLOF:  Independent and Leisure: watching TV, shopping, has a fitness center at her apartment complex  PATIENT GOALS:   Improve function in knee, get back to gym   OBJECTIVE:  Note: Objective measures were completed at Evaluation unless otherwise noted.  PATIENT SURVEYS:  Patient-Specific Activity Scoring Scheme  "0" represents "unable to perform." "10" represents "able  to perform at prior level. 0 1 2 3 4 5 6 7 8 9  10 (Date and Score)   Activity Eval  10/11/23   1. Walking 2 5   2. Bending the knee 2  5  3. Extending the knee 2 8  Score 2 6   Total score = sum of the activity scores/number of activities Minimum detectable change (90%CI) for average score = 2 points Minimum detectable change (90%CI) for single activity score = 3 points    COGNITIVE STATUS: Within functional limits for tasks assessed   SENSATION: WFL  GAIT: 09/08/23 Distance walked: 150' within clinic Assistive device utilized: Environmental consultant - 4 wheeled Level of assistance: Modified independence Comments: decreased stance on Rt, decr hip/knee flexion on Rt   EDEMA: 09/08/23  Knee joint line: Rt: 71 cm/Lt: 48.5 cm 09/19/23: knee joint line: Rt: 59.5 centimeters   LOWER EXTREMITY ROM:     ROM Right eval Left eval Rt 09/13/23 Rt 09/19/23 Right 09/21/23 Right 09/30/23 Rt 10/03/23 Rt 10/11/23  Knee flexion A: 21 P: 41 A: 108 A: 48 P: 60 A: 50 P: 60 P: 71* P 56* AA: 50 P: 58 AA:58 P: 60  Knee extension A: 8 (seated LAQ) P: 0 A: 0 A: 10 P: 8 A: 10 P: 6   A: -7 P: -5 A: -5 P: -2   (Blank rows = not tested)   LOWER EXTREMITY MMT:   09/08/2023 Not formally tested at eval; Rt knee grossly 3-/5  MMT Right 09/28/2023 Rt 10/03/23 Seated HHD  Hip flexion    Hip extension    Hip abduction    Hip adduction    Hip internal rotation    Hip external rotation    Knee flexion  18.7 22.4  Knee extension 4/5 20.5 21.3  Ankle dorsiflexion    Ankle plantarflexion    Ankle inversion    Ankle eversion     (Blank rows = not tested)                  Today's Treatment 10/13/23 Therex Scifit bike: partial revolutions to  full revolutions seat at 12 x 5 minutes; seat 10 x 3 min Supine feet on pball hamstring curl 2x10 Supine feet on pball quad set 2x10 Supine feet on pball knee extended bridge 2x10 Theract Supine feet on pball knee flexion/ext 2x10 strap behind knee for increased flexion and hamstring activation Stand to sit x10 from elevated bed Double leg press 75# 2x10 Single leg press within shortened range for terminal knee ext 62# 2x10 Modalities Vasopneumatic medium compression, Rt LE elevated on bolster, x 10 minutes; 34 deg  10/11/23 Therex Recumbent bike: partial revolutions seat at 6, bending to pt's tolerance and holding 3 sec x 7 minutes Calf raises x 10 Seated SLR: 2 x 10  Supine quad stretch using heel on the ball and strap to hold end range flexion  Supine hamstring stretch x30" Theract Stand to sit x 10 from standard height chair with UE support intermittently Double leg press: 50# 2 x 10 slow eccentrics, Rt LE only: 25# 2 x 10  Manual Contract/Relax opposite LE reciprocal motion x 5 holding 5 sec each PROM flexion and extension with overpressure Modalities Vasopneumatic medium compression, Rt LE elevated on bolster, x 10 minutes; 34 deg    10/07/23 Therex Scifit bike L1 UEs/LEs partial revolutions seat 8 x 8 min Seated Knee flexion self stretch 2x10 Supine hip flexor stretch x30" Supine quad stretch with strap x30" SAQ  with strap for terminal knee ext assist 2x10 Supine hamstring stretch x30" Supine SLR 2x10  Theract Stand to sit to elevated bed working on knee flexion with weightbearing 4x5 slowly lowering bed after each set  Manual Contract/Relax opposite LE reciprocal motion x 5 holding 5 sec each PROM flexion and extension with overpressure  Modalities Vasopneumatic medium compression, Rt LE elevated on bolster, x 10 minutes; 34 deg    10/05/23 TherEx SciFit bike UE/LEs partial revolutions; seat 9 x 8 min; L1 Alternating LAQ with holds at end range x 10 reps  bil Seated LAQ on Rt with 4# 2x10; 5 sec hold Seated SLR 2x10 on Rt with 3 sec hold  Manual Contract/Relax opposite LE reciprocal motion x 5 holding 5 sec each PROM flexion and extension with overpressure  Modalities Vasopneumatic medium compression, Rt LE elevated on bolster, x 10 minutes; 34 deg       PATIENT EDUCATION:  Education details: HEP Person educated: Patient Education method: Programmer, multimedia, Facilities manager, and Handouts Education comprehension: verbalized understanding, returned demonstration, and needs further education  HOME EXERCISE PROGRAM: Access Code: [email or web address] URL: [email or web address] Date: 09/08/2023 Prepared by: Casimer Clear  Exercises - Ankle Pumps in Elevation  - 10 x daily - 7 x weekly - 1 sets - 10 reps - Ankle Alphabet in Elevation  - 10 x daily - 7 x weekly - 1 sets - 10 reps - Quad Set  - 5-10 x daily - 7 x weekly - 1 sets - 5-10 reps - 5 sec hold - Seated Knee Flexion Extension AROM   - 5-10 x daily - 7 x weekly - 1 sets - 5-10 reps - 5 sec  hold   ASSESSMENT:  CLINICAL IMPRESSION: Plans for MUA. Continued knee ROM as much as pt tolerated. Continued quad and gross LE strengthening.    OBJECTIVE IMPAIRMENTS: Abnormal gait, decreased balance, decreased endurance, decreased knowledge of use of DME, decreased mobility, difficulty walking, decreased ROM, decreased strength, hypomobility, increased edema, increased fascial restrictions, increased muscle spasms, and pain.    GOALS: Goals reviewed with patient? Yes  SHORT TERM GOALS: Target date: 10/06/2023  Independent with initial HEP Goal status: MET 10/03/23  2.  Rt knee ROM improved to 0-90 for improved mobility Goal status: Ongoing 10/11/23   LONG TERM GOALS: Target date: 11/03/2023  Independent with final HEP Goal status: Ongoing  10/03/2023  2.  PSFS score improved by 3 points Goal status: MET 10/11/23  3.  Rt knee AROM improved to 0-100 for improved function and  mobility Goal status:  Ongoing  10/03/2023  4.  Report pain < 4/10 with standing and walking for improved function Goal status: Ongoing  10/03/2023  5.  Amb without AD without significant deviations for improved community access Goal status: Ongoing  10/03/2023    PLAN:  PT FREQUENCY: 2-3x/wk  PT DURATION: 8 weeks  PLANNED INTERVENTIONS: 97164- PT Re-evaluation, 97750- Physical Performance Testing, 97110-Therapeutic exercises, 97530- Therapeutic activity, 97112- Neuromuscular re-education, 97535- Self Care, 16109- Manual therapy, [phone]- Gait training, [phone]- Aquatic Therapy, [phone]- Electrical stimulation (unattended), 97016- Vasopneumatic device, Patient/Family education, Balance training, Stair training, Taping, Dry Needling, Joint mobilization, DME instructions, Cryotherapy, and Moist heat.  PLAN FOR NEXT SESSION: Mobility gains in manual and in intervention actively.  Strengthening as able/tolerated,    NEXT MD VISIT: MUA planned but not yet scheduled   Neev Mcmains April Ma L Nelli Swalley, PT, DPT 10/13/23 11:03 AM

## 2023-10-17 ENCOUNTER — Ambulatory Visit (INDEPENDENT_AMBULATORY_CARE_PROVIDER_SITE_OTHER): Admitting: Physical Therapy

## 2023-10-17 ENCOUNTER — Telehealth: Payer: Self-pay | Admitting: Orthopaedic Surgery

## 2023-10-17 ENCOUNTER — Other Ambulatory Visit: Payer: Self-pay | Admitting: Physician Assistant

## 2023-10-17 ENCOUNTER — Encounter: Payer: Self-pay | Admitting: Physical Therapy

## 2023-10-17 DIAGNOSIS — R600 Localized edema: Secondary | ICD-10-CM | POA: Diagnosis not present

## 2023-10-17 DIAGNOSIS — M25561 Pain in right knee: Secondary | ICD-10-CM | POA: Diagnosis not present

## 2023-10-17 DIAGNOSIS — M25661 Stiffness of right knee, not elsewhere classified: Secondary | ICD-10-CM | POA: Diagnosis not present

## 2023-10-17 MED ORDER — OXYCODONE HCL 5 MG PO TABS: 5.0000 mg | ORAL_TABLET | Freq: Four times a day (QID) | ORAL | 0 refills | Status: DC | PRN

## 2023-10-17 MED ORDER — METHOCARBAMOL 500 MG PO TABS: 500.0000 mg | ORAL_TABLET | Freq: Four times a day (QID) | ORAL | 1 refills | Status: DC | PRN

## 2023-10-17 NOTE — Therapy (Signed)
 OUTPATIENT PHYSICAL THERAPY TREATMENT  Patient Name: Leah Hampton MRN: [id number] DOB:10/16/1955, 68 y.o., female Today's Date: 10/17/2023   END OF SESSION:  PT End of Session - 10/17/23 1321     Visit Number 15    Number of Visits 24    Date for PT Re-Evaluation 11/03/23    Authorization Type [employer] Medicare Dual Complete    Authorization Time Period No authorization needed    Progress Note Due on Visit 20    PT Start Time 1312    PT Stop Time 1345    PT Time Calculation (min) 33 min    Activity Tolerance Patient tolerated treatment well;No increased pain    Behavior During Therapy [facility] for tasks assessed/performed                     Past Medical History:  Diagnosis Date   Bronchitis    Chronic bronchitis (HCC)    Cystitis    Hematuria    urologice eval, Dr Clifton Dames   History of chest pain    Hyperbilirubinemia    Hyperlipemia    Hypertelorism    Hypertension    Leukocytosis    Obesity    Osteoarthritis    right knee   Pre-diabetes    Sleep apnea 2012   slight   no CPAP   Vaginal Pap smear, abnormal    Vitamin D  deficiency 02/09/2018   Past Surgical History:  Procedure Laterality Date   CERVICAL POLYPECTOMY  07/15/2020   Procedure: POLYPECTOMY with Myosure;  Surgeon: Lenord Radon, MD;  Location: [facility] OR;  Service: Gynecology;;   COLONOSCOPY WITH PROPOFOL  N/A 03/27/2015   Procedure: COLONOSCOPY WITH PROPOFOL ;  Surgeon: Janel Medford, MD;  Location: [facility];  Service: Endoscopy;  Laterality: N/A;   COLONOSCOPY WITH PROPOFOL  N/A 10/02/2020   Procedure: COLONOSCOPY WITH PROPOFOL ;  Surgeon: Janel Medford, MD;  Location: [facility];  Service: Endoscopy;  Laterality: N/A;   COLPOSCOPY N/A 07/15/2020   Procedure: COLPOSCOPY;  Surgeon: Lenord Radon, MD;  Location: [facility] OR;  Service: Gynecology;  Laterality: N/A;   DILATATION & CURETTAGE/HYSTEROSCOPY WITH MYOSURE N/A 01/05/2017   Procedure: DILATATION & CURETTAGE/HYSTEROSCOPY WITH  MYOSURE;  Surgeon: Johnn Najjar, MD;  Location: [facility] ORS;  Service: Gynecology;  Laterality: N/A;  PostMenopausal Bleeding   HYSTEROSCOPY WITH D & C N/A 05/22/2019   Procedure: DILATATION AND CURETTAGE /HYSTEROSCOPY, POLYPECTOMY WITH MYOSURE;  Surgeon: Lenord Radon, MD;  Location: [facility];  Service: Gynecology;  Laterality: N/A;   HYSTEROSCOPY WITH D & C N/A 07/15/2020   Procedure: DILATATION AND CURETTAGE /HYSTEROSCOPY;  Surgeon: Lenord Radon, MD;  Location: [facility] OR;  Service: Gynecology;  Laterality: N/A;   KNEE ARTHROSCOPY  04/2007   left knee   LAPAROSCOPIC GASTRIC SLEEVE RESECTION     TOTAL KNEE ARTHROPLASTY Left 10/2008   Dr Franz Jacks   TOTAL KNEE ARTHROPLASTY Right 08/26/2023   Procedure: ARTHROPLASTY, KNEE, TOTAL;  Surgeon: Arnie Lao, MD;  Location: [facility];  Service: Orthopedics;  Laterality: Right;   UPPER GI ENDOSCOPY N/A 10/14/2020   Procedure: UPPER GI ENDOSCOPY;  Surgeon: Aldean Hummingbird, MD;  Location: [facility] OR[facility];  Service: General;  Laterality: N/A;   Patient Active Problem List   Diagnosis Date Noted   Arthrofibrosis of knee joint, right 10/12/2023   Status post total right knee replacement 08/26/2023   Sleep disturbance 01/21/2023   Lactose intolerance 10/22/2022   S/P gastric sleeve procedure 11/04/2020   Uncomplicated asthma 11/04/2020   Gastroesophageal reflux disease  11/04/2020   Primary osteoarthritis of both hips 09/30/2020   History of total left knee replacement 07/29/2020   Endometrial polyp 07/15/2020   Low grade squamous intraepithelial lesion (LGSIL) on cervical Pap smear 07/07/2020   Upper abdominal pain 08/20/2019   Right hip pain 05/28/2019   Post-menopausal bleeding 05/16/2019   Greater trochanteric pain syndrome of left lower extremity 03/20/2019   Type 2 diabetes mellitus without complication, without long-term current use of insulin  (HCC) 05/18/2018   Vitamin D  deficiency 02/09/2018   Onychomycosis 09/19/2017   Tinea pedis  of both feet 09/19/2017   Obstructive chronic bronchitis with exacerbation (HCC) 05/23/2017   Mixed hyperlipidemia 05/01/2014   Genital herpes 05/23/2012   Stress incontinence 03/04/2012   Unilateral primary osteoarthritis, right knee 10/12/2011   CTS (carpal tunnel syndrome) 07/13/2011   OSA (obstructive sleep apnea) 12/10/2010   Severe obesity (BMI >= 40) (HCC) 07/31/2008   Essential hypertension 07/03/2008    PCP: Dorrene Gaucher, NP  REFERRING PROVIDER: Arnie Lao, MD  REFERRING DIAG: M17.11 (ICD-10-CM) - Unilateral primary osteoarthritis, right knee  Rationale for Evaluation and Treatment: Rehabilitation  THERAPY DIAG:  Acute pain of right knee  Stiffness of right knee, not elsewhere classified  Localized edema  ONSET DATE: 08/26/23 (DOS)   SUBJECTIVE:                                                                                                                                                                                           SUBJECTIVE STATEMENT: Pt arriving 11 minutes late to therapy session.  Pt reporting 4/10 pain in her Rt knee. Pt waiting to get on schedule for Manipulation.    PERTINENT HISTORY:  besity, HTN, HLD, DM, OA, Lt TKA in 2010, s/p gastric sleeve resection 2022  PAIN:   NPRS scale:  4/10 upon arrival, 5/10 at worse Pain location: Rt knee Pain description: sharp, shooting, aching, tightness- sometimes a coldness Aggravating factors: bending her knee, standing. walking  Relieving factors: medication, ice  PRECAUTIONS:  None  RED FLAGS: None   WEIGHT BEARING RESTRICTIONS:  No  FALLS:  Has patient fallen in last 6 months? No  LIVING ENVIRONMENT: Lives with: lives alone Lives in: House/apartment Stairs: Yes: External: 16 steps; can reach both Has following equipment at home: Walker - 4 wheeled  OCCUPATION:  On disability  PLOF:  Independent and Leisure: watching TV, shopping, has a fitness center at her  apartment complex  PATIENT GOALS:  Improve function in knee, get back to gym   OBJECTIVE:  Note: Objective measures were completed at Evaluation unless otherwise noted.  PATIENT SURVEYS:  Patient-Specific Activity Scoring Scheme  "  0" represents "unable to perform." "10" represents "able to perform at prior level. 0 1 2 3 4 5 6 7 8 9  10 (Date and Score)   Activity Eval  10/11/23   1. Walking 2 5   2. Bending the knee 2  5  3. Extending the knee 2 8  Score 2 6   Total score = sum of the activity scores/number of activities Minimum detectable change (90%CI) for average score = 2 points Minimum detectable change (90%CI) for single activity score = 3 points    COGNITIVE STATUS: Within functional limits for tasks assessed   SENSATION: WFL  GAIT: 09/08/23 Distance walked: 150' within clinic Assistive device utilized: Environmental consultant - 4 wheeled Level of assistance: Modified independence Comments: decreased stance on Rt, decr hip/knee flexion on Rt   EDEMA: 09/08/23  Knee joint line: Rt: 71 cm/Lt: 48.5 cm 09/19/23: knee joint line: Rt: 59.5 centimeters   LOWER EXTREMITY ROM:     ROM Right eval Left eval Rt 09/13/23 Rt 09/19/23 Right 09/21/23 Right 09/30/23 Rt 10/03/23 Rt 10/11/23 Rt 10/17/23  Knee flexion A: 21 P: 41 A: 108 A: 48 P: 60 A: 50 P: 60 P: 71* P 56* AA: 50 P: 58 AA:58 P: 60 AA: 60  Knee extension A: 8 (seated LAQ) P: 0 A: 0 A: 10 P: 8 A: 10 P: 6   A: -7 P: -5 A: -5 P: -2 A: -5   (Blank rows = not tested)   LOWER EXTREMITY MMT:   09/08/2023 Not formally tested at eval; Rt knee grossly 3-/5  MMT Right 09/28/2023 Rt 10/03/23 Seated HHD 10/17/23 Rt Seated  HHD  Hip flexion     Hip extension     Hip abduction     Hip adduction     Hip internal rotation     Hip external rotation     Knee flexion  18.7 22.4 19.2 25.4  Knee extension 4/5 20.5 21.3 30.6 33.1  Ankle dorsiflexion     Ankle plantarflexion     Ankle inversion     Ankle eversion       (Blank rows = not tested)                  Today's Treatment 10/17/23 Therex Nustep: level 5 x 8 minutes UE/LE Standing toe/heel raises x 15 c UE support Supine feet on pball knee extended bridge 2x10 Theract Step ups on 4 inch step x 15  Sit to stand: 2 x 10 from 18 inch heigh chair using no UE 's  Double leg press 81# 3 x 10 Single leg press within shortened range for terminal knee ext 62# 2x10    10/13/23 Therex Scifit bike: partial revolutions to full revolutions seat at 12 x 5 minutes; seat 10 x 3 min Supine feet on pball hamstring curl 2x10 Supine feet on pball quad set 2x10 Supine feet on pball knee extended bridge 2x10 Theract Supine feet on pball knee flexion/ext 2x10 strap behind knee for increased flexion and hamstring activation Stand to sit x10 from elevated bed Double leg press 75# 2x10 Single leg press within shortened range for terminal knee ext 62# 2x10 Modalities Vasopneumatic medium compression, Rt LE elevated on bolster, x 10 minutes; 34 deg  10/11/23 Therex Recumbent bike: partial revolutions seat at 6, bending to pt's tolerance and holding 3 sec x 7 minutes Calf raises x 10 Seated SLR: 2 x 10  Supine quad stretch using heel on the ball and strap to  hold end range flexion  Supine hamstring stretch x30" Theract Stand to sit x 10 from standard height chair with UE support intermittently Double leg press: 50# 2 x 10 slow eccentrics, Rt LE only: 25# 2 x 10  Manual Contract/Relax opposite LE reciprocal motion x 5 holding 5 sec each PROM flexion and extension with overpressure Modalities Vasopneumatic medium compression, Rt LE elevated on bolster, x 10 minutes; 34 deg    10/07/23 Therex Scifit bike L1 UEs/LEs partial revolutions seat 8 x 8 min Seated Knee flexion self stretch 2x10 Supine hip flexor stretch x30" Supine quad stretch with strap x30" SAQ with strap for terminal knee ext assist 2x10 Supine hamstring stretch x30" Supine SLR  2x10  Theract Stand to sit to elevated bed working on knee flexion with weightbearing 4x5 slowly lowering bed after each set  Manual Contract/Relax opposite LE reciprocal motion x 5 holding 5 sec each PROM flexion and extension with overpressure  Modalities Vasopneumatic medium compression, Rt LE elevated on bolster, x 10 minutes; 34 deg    10/05/23 TherEx SciFit bike UE/LEs partial revolutions; seat 9 x 8 min; L1 Alternating LAQ with holds at end range x 10 reps bil Seated LAQ on Rt with 4# 2x10; 5 sec hold Seated SLR 2x10 on Rt with 3 sec hold  Manual Contract/Relax opposite LE reciprocal motion x 5 holding 5 sec each PROM flexion and extension with overpressure  Modalities Vasopneumatic medium compression, Rt LE elevated on bolster, x 10 minutes; 34 deg       PATIENT EDUCATION:  Education details: HEP Person educated: Patient Education method: Programmer, multimedia, Facilities manager, and Handouts Education comprehension: verbalized understanding, returned demonstration, and needs further education  HOME EXERCISE PROGRAM: Access Code: [email or web address] URL: [email or web address] Date: 10/17/2023 Prepared by: Jerrel Mor  Exercises - Ankle Alphabet in Elevation  - 10 x daily - 7 x weekly - 1 sets - 10 reps - Quad Set  - 5-10 x daily - 7 x weekly - 1 sets - 5-10 reps - 5 sec hold - Seated Knee Flexion Extension AROM   - 5-10 x daily - 7 x weekly - 1 sets - 5-10 reps - 5 sec  hold - Supine Bridge  - 2 x daily - 7 x weekly - 2 sets - 10 reps - 3 seconds hold - Sit to Stand with Counter Support  - 2 x daily - 7 x weekly - 2 sets - 10 reps - Seated Heel Slide  - 2 x daily - 7 x weekly - 2 sets - 10 reps   ASSESSMENT:  CLINICAL IMPRESSION: Pt has made some progress with Rt LE strengthening since her initial evaluation still with limitations in Rt knee arc of motion from 5-60 degrees active assisted. Pt is waiting for manipulation to be scheduled and we discussed plan  for attending therapy up to 5 times a week following the procedure. Pt's HEP was updated. Continue skilled PT POC progressing with ROM and functional strengthening.    OBJECTIVE IMPAIRMENTS: Abnormal gait, decreased balance, decreased endurance, decreased knowledge of use of DME, decreased mobility, difficulty walking, decreased ROM, decreased strength, hypomobility, increased edema, increased fascial restrictions, increased muscle spasms, and pain.    GOALS: Goals reviewed with patient? Yes  SHORT TERM GOALS: Target date: 10/06/2023  Independent with initial HEP Goal status: MET 10/03/23  2.  Rt knee ROM improved to 0-90 for improved mobility Goal status: Ongoing 10/11/23   LONG TERM GOALS: Target date: 11/03/2023  Independent with final HEP Goal  status: Ongoing  10/03/2023  2.  PSFS score improved by 3 points Goal status: MET 10/11/23  3.  Rt knee AROM improved to 0-100 for improved function and mobility Goal status:  Ongoing  10/03/2023  4.  Report pain < 4/10 with standing and walking for improved function Goal status: Ongoing  10/03/2023  5.  Amb without AD without significant deviations for improved community access Goal status: Ongoing  10/03/2023    PLAN:  PT FREQUENCY: 2-3x/wk  PT DURATION: 8 weeks  PLANNED INTERVENTIONS: 97164- PT Re-evaluation, 97750- Physical Performance Testing, 97110-Therapeutic exercises, 97530- Therapeutic activity, 97112- Neuromuscular re-education, 97535- Self Care, [address]- Manual therapy, [phone]- Gait training, [phone]- Aquatic Therapy, [phone]- Electrical stimulation (unattended), 97016- Vasopneumatic device, Patient/Family education, Balance training, Stair training, Taping, Dry Needling, Joint mobilization, DME instructions, Cryotherapy, and Moist heat.  PLAN FOR NEXT SESSION: Mobility gains in manual and in intervention actively.  Strengthening as able/tolerated,    NEXT MD VISIT: MUA planned but not yet scheduled   Marysue Sola, PT,  MPT 10/17/23 1:50 PM

## 2023-10-17 NOTE — Telephone Encounter (Signed)
 Pt came in office to request medication refill of oxycodone  and methocarbamol . To [facility] at [address], [place], [place], [address]

## 2023-10-19 ENCOUNTER — Encounter: Admitting: Physical Therapy

## 2023-10-25 NOTE — Patient Instructions (Signed)
 SURGICAL WAITING ROOM VISITATION  Patients having surgery or a procedure may have no more than 2 support people in the waiting area - these visitors may rotate.    Children under the age of 28 must have an adult with them who is not the patient.  Visitors with respiratory illnesses are discouraged from visiting and should remain at home.  If the patient needs to stay at the hospital during part of their recovery, the visitor guidelines for inpatient rooms apply. Pre-op nurse will coordinate an appropriate time for 1 support person to accompany patient in pre-op.  This support person may not rotate.    Please refer to the [facility] website for the visitor guidelines for Inpatients (after your surgery is over and you are in a regular room).       Your procedure is scheduled on: 10/27/23   Report to [facility] Main Entrance    Report to admitting at : 10:15 AM   Call this number if you have problems the morning of surgery [phone]   Do not eat food :After Midnight.   After Midnight you may have the following liquids until : 9:30 AM DAY OF SURGERY  Water  Non-Citrus Juices (without pulp, NO RED-Apple, White grape, White cranberry) Black Coffee (NO MILK/CREAM OR CREAMERS, sugar ok)  Clear Tea (NO MILK/CREAM OR CREAMERS, sugar ok) regular and decaf                             Plain Jell-O (NO RED)                                           Fruit ices (not with fruit pulp, NO RED)                                     Popsicles (NO RED)                                                               Sports drinks like Gatorade (NO RED)   The day of surgery:  Drink ONE (1) Pre-Surgery Clear G2 at : : 9:30 AM the morning of surgery. Drink in one sitting. Do not sip.  This drink was given to you during your hospital  pre-op appointment visit. Nothing else to drink after completing the  Pre-Surgery Clear Ensure or G2.          If you have questions, please contact your  surgeon's office.  FOLLOW ANY ADDITIONAL PRE OP INSTRUCTIONS YOU RECEIVED FROM YOUR SURGEON'S OFFICE!!!   Oral Hygiene is also important to reduce your risk of infection.                                    Remember - BRUSH YOUR TEETH THE MORNING OF SURGERY WITH YOUR REGULAR TOOTHPASTE  DENTURES WILL BE REMOVED PRIOR TO SURGERY PLEASE DO NOT APPLY "Poly grip" OR ADHESIVES!!!   Do NOT smoke after Midnight   Stop  all vitamins and herbal supplements 7 days before surgery.   Take these medicines the morning of surgery with A SIP OF WATER :amlodipine .Use inhalers as usual and bring them.   HOLD Mounjaro  after: 10/19/23                 You may not have any metal on your body including hair pins, jewelry, and body piercing             Do not wear make-up, lotions, powders, perfumes/cologne, or deodorant  Do not wear nail polish including gel and S&S, artificial/acrylic nails, or any other type of covering on natural nails including finger and toenails. If you have artificial nails, gel coating, etc. that needs to be removed by a nail salon please have this removed prior to surgery or surgery may need to be canceled/ delayed if the surgeon/ anesthesia feels like they are unable to be safely monitored.   Do not shave  48 hours prior to surgery.    Do not bring valuables to the hospital. [facility] IS NOT             RESPONSIBLE   FOR VALUABLES.   Contacts, glasses, dentures or bridgework may not be worn into surgery.   Bring small overnight bag day of surgery.   DO NOT BRING YOUR HOME MEDICATIONS TO THE HOSPITAL. PHARMACY WILL DISPENSE MEDICATIONS LISTED ON YOUR MEDICATION LIST TO YOU DURING YOUR ADMISSION IN THE HOSPITAL!    Patients discharged on the day of surgery will not be allowed to drive home.  Someone NEEDS to stay with you for the first 24 hours after anesthesia.   Special Instructions: Bring a copy of your healthcare power of attorney and living will documents the day of surgery  if you haven't scanned them before.              Please read over the following fact sheets you were given: IF YOU HAVE QUESTIONS ABOUT YOUR PRE-OP INSTRUCTIONS PLEASE CALL [phone]   If you received a COVID test during your pre-op visit  it is requested that you wear a mask when out in public, stay away from anyone that may not be feeling well and notify your surgeon if you develop symptoms. If you test positive for Covid or have been in contact with anyone that has tested positive in the last 10 days please notify you surgeon.    [facility] - Preparing for Surgery Before surgery, you can play an important role.  Because skin is not sterile, your skin needs to be as free of germs as possible.  You can reduce the number of germs on your skin by washing with CHG (chlorahexidine gluconate) soap before surgery.  CHG is an antiseptic cleaner which kills germs and bonds with the skin to continue killing germs even after washing. Please DO NOT use if you have an allergy to CHG or antibacterial soaps.  If your skin becomes reddened/irritated stop using the CHG and inform your nurse when you arrive at Short Stay. Do not shave (including legs and underarms) for at least 48 hours prior to the first CHG shower.  You may shave your face/neck. Please follow these instructions carefully:  1.  Shower with CHG Soap the night before surgery and the  morning of Surgery.  2.  If you choose to wash your hair, wash your hair first as usual with your  normal  shampoo.  3.  After you shampoo, rinse your hair and body  thoroughly to remove the  shampoo.                           4.  Use CHG as you would any other liquid soap.  You can apply chg directly  to the skin and wash                       Gently with a scrungie or clean washcloth.  5.  Apply the CHG Soap to your body ONLY FROM THE NECK DOWN.   Do not use on face/ open                           Wound or open sores. Avoid contact with eyes, ears mouth and  genitals (private parts).                       Wash face,  Genitals (private parts) with your normal soap.             6.  Wash thoroughly, paying special attention to the area where your surgery  will be performed.  7.  Thoroughly rinse your body with warm water  from the neck down.  8.  DO NOT shower/wash with your normal soap after using and rinsing off  the CHG Soap.                9.  Pat yourself dry with a clean towel.            10.  Wear clean pajamas.            11.  Place clean sheets on your bed the night of your first shower and do not  sleep with pets. Day of Surgery : Do not apply any lotions/deodorants the morning of surgery.  Please wear clean clothes to the hospital/surgery center.  FAILURE TO FOLLOW THESE INSTRUCTIONS MAY RESULT IN THE CANCELLATION OF YOUR SURGERY PATIENT SIGNATURE_________________________________  NURSE SIGNATURE__________________________________  ________________________________________________________________________  Leah Hampton  An incentive spirometer is a tool that can help keep your lungs clear and active. This tool measures how well you are filling your lungs with each breath. Taking long deep breaths may help reverse or decrease the chance of developing breathing (pulmonary) problems (especially infection) following: A long period of time when you are unable to move or be active. BEFORE THE PROCEDURE  If the spirometer includes an indicator to show your best effort, your nurse or respiratory therapist will set it to a desired goal. If possible, sit up straight or lean slightly forward. Try not to slouch. Hold the incentive spirometer in an upright position. INSTRUCTIONS FOR USE  Sit on the edge of your bed if possible, or sit up as far as you can in bed or on a chair. Hold the incentive spirometer in an upright position. Breathe out normally. Place the mouthpiece in your mouth and seal your lips tightly around it. Breathe in slowly  and as deeply as possible, raising the piston or the ball toward the top of the column. Hold your breath for 3-5 seconds or for as long as possible. Allow the piston or ball to fall to the bottom of the column. Remove the mouthpiece from your mouth and breathe out normally. Rest for a few seconds and repeat Steps 1 through 7 at least 10 times every 1-2 hours when you are awake. Take  your time and take a few normal breaths between deep breaths. The spirometer may include an indicator to show your best effort. Use the indicator as a goal to work toward during each repetition. After each set of 10 deep breaths, practice coughing to be sure your lungs are clear. If you have an incision (the cut made at the time of surgery), support your incision when coughing by placing a pillow or rolled up towels firmly against it. Once you are able to get out of bed, walk around indoors and cough well. You may stop using the incentive spirometer when instructed by your caregiver.  RISKS AND COMPLICATIONS Take your time so you do not get dizzy or light-headed. If you are in pain, you may need to take or ask for pain medication before doing incentive spirometry. It is harder to take a deep breath if you are having pain. AFTER USE Rest and breathe slowly and easily. It can be helpful to keep track of a log of your progress. Your caregiver can provide you with a simple table to help with this. If you are using the spirometer at home, follow these instructions: SEEK MEDICAL CARE IF:  You are having difficultly using the spirometer. You have trouble using the spirometer as often as instructed. Your pain medication is not giving enough relief while using the spirometer. You develop fever of 100.5 F (38.1 C) or higher. SEEK IMMEDIATE MEDICAL CARE IF:  You cough up bloody sputum that had not been present before. You develop fever of 102 F (38.9 C) or greater. You develop worsening pain at or near the incision  site. MAKE SURE YOU:  Understand these instructions. Will watch your condition. Will get help right away if you are not doing well or get worse. Document Released: 09/13/2006 Document Revised: 07/26/2011 Document Reviewed: 11/14/2006 [facility] Patient Information 2014 [place], [place].   ________________________________________________________________________

## 2023-10-26 ENCOUNTER — Encounter (HOSPITAL_COMMUNITY): Payer: Self-pay

## 2023-10-26 ENCOUNTER — Other Ambulatory Visit: Payer: Self-pay

## 2023-10-26 ENCOUNTER — Encounter (HOSPITAL_COMMUNITY)
Admission: RE | Admit: 2023-10-26 | Discharge: 2023-10-26 | Disposition: A | Discharge status: Home / Self Care | Source: Ambulatory Visit | Attending: Orthopaedic Surgery | Admitting: Orthopaedic Surgery

## 2023-10-26 VITALS — Ht 63.0 in | Wt 205.0 lb

## 2023-10-26 DIAGNOSIS — I10 Essential (primary) hypertension: Secondary | ICD-10-CM

## 2023-10-26 NOTE — Progress Notes (Signed)
 For Anesthesia: PCP - Dorrene Gaucher, NP  Cardiologist - Carson Clara, MD   Bowel Prep reminder:  Chest x-ray -  EKG -  Stress Test - 08-28-2020  ECHO - 03-18-2020  Cardiac Cath -  Pacemaker/ICD device last checked: Pacemaker orders received: Device Rep notified:  Spinal Cord Stimulator:N/A  Sleep Study - Yes CPAP - NO  Fasting Blood Sugar - N/A Checks Blood Sugar ___0__ times a day Date and result of last Hgb A1c-5.7: 08/23/23  Last dose of GLP1 agonist- Mounjaro . Last dose: 10/19/23 GLP1 instructions:   Last dose of SGLT-2 inhibitors- N/A SGLT-2 instructions:   Blood Thinner Instructions:Xarelto : ON hold for couple weeks now. Aspirin  Instructions: Last Dose:  Activity level: Can go up a flight of stairs and activities of daily living without stopping and without chest pain and/or shortness of breath   Able to exercise without chest pain and/or shortness of breath   Unable to go up a flight of stairs without  shortness of breath     Anesthesia review: Hx: HTN,OSA(NO CPAP),Pre-DIA.  Patient denies shortness of breath, fever, cough and chest pain at PAT appointment   Patient verbalized understanding of instructions that were given to them at the PAT appointment. Patient was also instructed that they will need to review over the PAT instructions again at home before surgery.

## 2023-10-26 NOTE — H&P (Signed)
 Leah Hampton is an 68 y.o. female.   Chief Complaint: Right knee stiffness with decreased motion HPI: The patient is a 68 year old female who is at 2 months status post a right total knee arthroplasty.  Her postoperative course has been complicated by knee pain and decreased motion with the right knee.  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  Her flexion is significant limited.  At this point she has tried and failed conservative treatment with multiple visits for physical therapy as an outpatient.  We have recommended a manipulation under anesthesia as an outpatient for her right knee to try to get it bending better.  Past Medical History:  Diagnosis Date   Bronchitis    Chronic bronchitis (HCC)    Cystitis    Hematuria    urologice eval, Dr Clifton Dames   History of chest pain    Hyperbilirubinemia    Hyperlipemia    Hypertelorism    Hypertension    Leukocytosis    Obesity    Osteoarthritis    right knee   Pre-diabetes    Sleep apnea 2012   slight   no CPAP   Vaginal Pap smear, abnormal    Vitamin D  deficiency 02/09/2018    Past Surgical History:  Procedure Laterality Date   CERVICAL POLYPECTOMY  07/15/2020   Procedure: POLYPECTOMY with Myosure;  Surgeon: Lenord Radon, MD;  Location: [facility] OR;  Service: Gynecology;;   COLONOSCOPY WITH PROPOFOL  N/A 03/27/2015   Procedure: COLONOSCOPY WITH PROPOFOL ;  Surgeon: Janel Medford, MD;  Location: [facility];  Service: Endoscopy;  Laterality: N/A;   COLONOSCOPY WITH PROPOFOL  N/A 10/02/2020   Procedure: COLONOSCOPY WITH PROPOFOL ;  Surgeon: Janel Medford, MD;  Location: [facility];  Service: Endoscopy;  Laterality: N/A;   COLPOSCOPY N/A 07/15/2020   Procedure: COLPOSCOPY;  Surgeon: Lenord Radon, MD;  Location: [facility] OR;  Service: Gynecology;  Laterality: N/A;   DILATATION & CURETTAGE/HYSTEROSCOPY WITH MYOSURE N/A 01/05/2017   Procedure: DILATATION & CURETTAGE/HYSTEROSCOPY WITH MYOSURE;   Surgeon: Johnn Najjar, MD;  Location: [facility] ORS;  Service: Gynecology;  Laterality: N/A;  PostMenopausal Bleeding   HYSTEROSCOPY WITH D & C N/A 05/22/2019   Procedure: DILATATION AND CURETTAGE /HYSTEROSCOPY, POLYPECTOMY WITH MYOSURE;  Surgeon: Lenord Radon, MD;  Location: [facility];  Service: Gynecology;  Laterality: N/A;   HYSTEROSCOPY WITH D & C N/A 07/15/2020   Procedure: DILATATION AND CURETTAGE /HYSTEROSCOPY;  Surgeon: Lenord Radon, MD;  Location: [facility] OR;  Service: Gynecology;  Laterality: N/A;   KNEE ARTHROSCOPY  04/2007   left knee   LAPAROSCOPIC GASTRIC SLEEVE RESECTION     TOTAL KNEE ARTHROPLASTY Left 10/2008   Dr Franz Jacks   TOTAL KNEE ARTHROPLASTY Right 08/26/2023   Procedure: ARTHROPLASTY, KNEE, TOTAL;  Surgeon: Arnie Lao, MD;  Location: [facility];  Service: Orthopedics;  Laterality: Right;   UPPER GI ENDOSCOPY N/A 10/14/2020   Procedure: UPPER GI ENDOSCOPY;  Surgeon: Aldean Hummingbird, MD;  Location: [facility];  Service: General;  Laterality: N/A;    Family History  Problem Relation Age of Onset   Heart failure Mother    Arthritis Mother    Hypertension Mother    Stroke Mother    Diabetes Mellitus II Mother    Heart attack Father 52   Diabetes Other    Arthritis Other    Stroke Other    Coronary artery disease Other    Hypertension Sister        x 2   CVA Sister  CAD Sister 45   Diabetes Mellitus II Sister    Cirrhosis Brother    Alcohol abuse Brother    Colon cancer Neg Hx    Social History:  reports that she has never smoked. She has never used smokeless tobacco. She reports that she does not drink alcohol and does not use drugs.  Allergies:  Allergies  Allergen Reactions   Sulfamethoxazole -Trimethoprim [Bactrim] Itching   Voltaren  [Diclofenac ] Diarrhea   Codeine Nausea And Vomiting and Anxiety    No medications prior to admission.    No results found for this or any previous visit (from the past 48 hours). No results  found.  Review of Systems  Last menstrual period 11/10/2011. Physical Exam Vitals reviewed.  Constitutional:      Appearance: Normal appearance. She is obese.  HENT:     Head: Normocephalic and atraumatic.  Eyes:     Extraocular Movements: Extraocular movements intact.     Pupils: Pupils are equal, round, and reactive to light.  Cardiovascular:     Rate and Rhythm: Normal rate.  Pulmonary:     Effort: Pulmonary effort is normal.     Breath sounds: Normal breath sounds.  Abdominal:     Palpations: Abdomen is soft.  Musculoskeletal:     Cervical back: Normal range of motion and neck supple.     Right knee: Decreased range of motion.  Neurological:     Mental Status: She is alert and oriented to person, place, and time.  Psychiatric:        Behavior: Behavior normal.      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  She will then resume aggressive physical therapy on that need to get it flexing better hopefully.  Arnie Lao, MD 10/26/2023, 6:40 PM

## 2023-10-26 NOTE — Anesthesia Preprocedure Evaluation (Addendum)
 Anesthesia Evaluation  Patient identified by MRN, date of birth, ID band Patient awake    Reviewed: Allergy & Precautions, NPO status , Patient's Chart, lab work & pertinent test results  Airway Mallampati: III  TM Distance: >3 FB Neck ROM: Full    Dental  (+) Teeth Intact, Dental Advisory Given   Pulmonary asthma , sleep apnea (no cpap) , COPD   Pulmonary exam normal breath sounds clear to auscultation       Cardiovascular hypertension (126/63 preop), Pt. on medications Normal cardiovascular exam Rhythm:Regular Rate:Normal     Neuro/Psych negative neurological ROS  negative psych ROS   GI/Hepatic Neg liver ROS,GERD  Controlled,,  Endo/Other  diabetes (prediabetic)  BMI 36  Renal/GU negative Renal ROS  negative genitourinary   Musculoskeletal  (+) Arthritis , Osteoarthritis,    Abdominal  (+) + obese  Peds  Hematology negative hematology ROS (+)   Anesthesia Other Findings Mounjaro  LD:   Reproductive/Obstetrics negative OB ROS                             Anesthesia Physical Anesthesia Plan  ASA: 2  Anesthesia Plan: General and Regional   Post-op Pain Management: Regional block* and Tylenol  PO (pre-op)*   Induction: Intravenous  PONV Risk Score and Plan: 2 and Propofol  infusion, TIVA, Treatment may vary due to age or medical condition and Midazolam   Airway Management Planned: Natural Airway and Mask  Additional Equipment: None  Intra-op Plan:   Post-operative Plan:   Informed Consent: I have reviewed the patients History and Physical, chart, labs and discussed the procedure including the risks, benefits and alternatives for the proposed anesthesia with the patient or authorized representative who has indicated his/her understanding and acceptance.     Dental advisory given  Plan Discussed with: CRNA  Anesthesia Plan Comments:        Anesthesia Quick Evaluation

## 2023-10-27 ENCOUNTER — Encounter (HOSPITAL_COMMUNITY): Payer: Self-pay | Admitting: Orthopaedic Surgery

## 2023-10-27 ENCOUNTER — Ambulatory Visit (HOSPITAL_COMMUNITY): Admitting: Certified Registered"

## 2023-10-27 ENCOUNTER — Encounter (HOSPITAL_COMMUNITY)
Admission: RE | Disposition: A | Discharge status: Home / Self Care | Payer: Self-pay | Source: Home / Self Care | Attending: Orthopaedic Surgery

## 2023-10-27 ENCOUNTER — Ambulatory Visit (HOSPITAL_COMMUNITY)
Admission: RE | Admit: 2023-10-27 | Discharge: 2023-10-27 | Disposition: A | Discharge status: Home / Self Care | Attending: Orthopaedic Surgery | Admitting: Orthopaedic Surgery

## 2023-10-27 DIAGNOSIS — G8918 Other acute postprocedural pain: Secondary | ICD-10-CM | POA: Diagnosis not present

## 2023-10-27 DIAGNOSIS — R7303 Prediabetes: Secondary | ICD-10-CM | POA: Insufficient documentation

## 2023-10-27 DIAGNOSIS — E669 Obesity, unspecified: Secondary | ICD-10-CM | POA: Diagnosis not present

## 2023-10-27 DIAGNOSIS — I10 Essential (primary) hypertension: Secondary | ICD-10-CM | POA: Insufficient documentation

## 2023-10-27 DIAGNOSIS — Z6836 Body mass index (BMI) 36.0-36.9, adult: Secondary | ICD-10-CM | POA: Diagnosis not present

## 2023-10-27 DIAGNOSIS — G4730 Sleep apnea, unspecified: Secondary | ICD-10-CM | POA: Diagnosis not present

## 2023-10-27 DIAGNOSIS — M24661 Ankylosis, right knee: Secondary | ICD-10-CM | POA: Diagnosis present

## 2023-10-27 DIAGNOSIS — Z96653 Presence of artificial knee joint, bilateral: Secondary | ICD-10-CM | POA: Insufficient documentation

## 2023-10-27 DIAGNOSIS — K219 Gastro-esophageal reflux disease without esophagitis: Secondary | ICD-10-CM | POA: Insufficient documentation

## 2023-10-27 DIAGNOSIS — M1711 Unilateral primary osteoarthritis, right knee: Secondary | ICD-10-CM | POA: Diagnosis not present

## 2023-10-27 DIAGNOSIS — Z96651 Presence of right artificial knee joint: Secondary | ICD-10-CM | POA: Diagnosis present

## 2023-10-27 HISTORY — PX: KNEE CLOSED REDUCTION: SHX995

## 2023-10-27 LAB — CBC
HCT: 39.3 % (ref 36.0–46.0)
Hemoglobin: 12.3 g/dL (ref 12.0–15.0)
MCH: 29.6 pg (ref 26.0–34.0)
MCHC: 31.3 g/dL (ref 30.0–36.0)
MCV: 94.5 fL (ref 80.0–100.0)
Platelets: 230 10*3/uL (ref 150–400)
RBC: 4.16 MIL/uL (ref 3.87–5.11)
RDW: 15.6 % — ABNORMAL HIGH (ref 11.5–15.5)
WBC: 5.5 10*3/uL (ref 4.0–10.5)
nRBC: 0 % (ref 0.0–0.2)

## 2023-10-27 LAB — BASIC METABOLIC PANEL WITH GFR
Anion gap: 9 (ref 5–15)
BUN: 16 mg/dL (ref 8–23)
CO2: 24 mmol/L (ref 22–32)
Calcium: 9.2 mg/dL (ref 8.9–10.3)
Chloride: 109 mmol/L (ref 98–111)
Creatinine, Ser: 0.72 mg/dL (ref 0.44–1.00)
GFR, Estimated: >60 mL/min (ref >60)
Glucose, Bld: 90 mg/dL (ref 70–99)
Potassium: 3.5 mmol/L (ref 3.5–5.1)
Sodium: 142 mmol/L (ref 135–145)

## 2023-10-27 SURGERY — MANIPULATION, KNEE, CLOSED
Anesthesia: Monitor Anesthesia Care | Site: Knee | Laterality: Right

## 2023-10-27 MED ORDER — HYDROMORPHONE HCL 1 MG/ML IJ SOLN
0.2500 mg | INTRAMUSCULAR | Status: DC | PRN
  Administered 2023-10-27: 0.5 mg via INTRAVENOUS

## 2023-10-27 MED ORDER — OXYCODONE HCL 5 MG PO TABS
5.0000 mg | ORAL_TABLET | Freq: Once | ORAL | Status: AC | PRN
  Administered 2023-10-27: 5 mg via ORAL

## 2023-10-27 MED ORDER — MIDAZOLAM HCL 2 MG/2ML IJ SOLN
1.0000 mg | Freq: Once | INTRAMUSCULAR | Status: AC
  Administered 2023-10-27: 2 mg via INTRAVENOUS

## 2023-10-27 MED ORDER — DEXAMETHASONE SODIUM PHOSPHATE 10 MG/ML IJ SOLN
INTRAMUSCULAR | Status: AC
  Filled 2023-10-27: qty 1

## 2023-10-27 MED ORDER — ORAL CARE MOUTH RINSE: 15.0000 mL | Freq: Once | OROMUCOSAL | Status: AC

## 2023-10-27 MED ORDER — ONDANSETRON HCL 4 MG/2ML IJ SOLN
4.0000 mg | Freq: Once | INTRAMUSCULAR | Status: AC | PRN
  Administered 2023-10-27: 4 mg via INTRAVENOUS

## 2023-10-27 MED ORDER — OXYCODONE HCL 5 MG PO TABS
ORAL_TABLET | ORAL | Status: AC
  Filled 2023-10-27: qty 1

## 2023-10-27 MED ORDER — BUPIVACAINE HCL (PF) 0.25 % IJ SOLN
INTRAMUSCULAR | Status: AC
  Filled 2023-10-27: qty 30

## 2023-10-27 MED ORDER — OXYCODONE HCL 5 MG PO TABS: 5.0000 mg | ORAL_TABLET | Freq: Four times a day (QID) | ORAL | 0 refills | Status: DC | PRN

## 2023-10-27 MED ORDER — DEXAMETHASONE SODIUM PHOSPHATE 10 MG/ML IJ SOLN
INTRAMUSCULAR | Status: DC | PRN
  Administered 2023-10-27: 10 mg

## 2023-10-27 MED ORDER — PROPOFOL 10 MG/ML IV BOLUS
INTRAVENOUS | Status: DC | PRN
  Administered 2023-10-27: 50 mg via INTRAVENOUS
  Administered 2023-10-27: 100 mg via INTRAVENOUS

## 2023-10-27 MED ORDER — AMISULPRIDE (ANTIEMETIC) 5 MG/2ML IV SOLN: 10.0000 mg | Freq: Once | INTRAVENOUS | Status: DC | PRN

## 2023-10-27 MED ORDER — METHYLPREDNISOLONE ACETATE 40 MG/ML IJ SUSP
INTRAMUSCULAR | Status: AC
  Filled 2023-10-27: qty 1

## 2023-10-27 MED ORDER — PROPOFOL 10 MG/ML IV BOLUS
INTRAVENOUS | Status: AC
  Filled 2023-10-27: qty 20

## 2023-10-27 MED ORDER — ROPIVACAINE HCL 5 MG/ML IJ SOLN
INTRAMUSCULAR | Status: DC | PRN
  Administered 2023-10-27: 30 mL via PERINEURAL

## 2023-10-27 MED ORDER — FENTANYL CITRATE PF 50 MCG/ML IJ SOSY: 50.0000 ug | PREFILLED_SYRINGE | Freq: Once | INTRAMUSCULAR | Status: AC

## 2023-10-27 MED ORDER — BUPIVACAINE HCL (PF) 0.25 % IJ SOLN
INTRAMUSCULAR | Status: DC | PRN
  Administered 2023-10-27: 5 mL via INTRAMUSCULAR

## 2023-10-27 MED ORDER — LIDOCAINE HCL (PF) 2 % IJ SOLN
INTRAMUSCULAR | Status: AC
  Filled 2023-10-27: qty 5

## 2023-10-27 MED ORDER — PROPOFOL 500 MG/50ML IV EMUL
INTRAVENOUS | Status: AC
  Filled 2023-10-27: qty 50

## 2023-10-27 MED ORDER — LACTATED RINGERS IV SOLN: INTRAVENOUS | Status: DC

## 2023-10-27 MED ORDER — CHLORHEXIDINE GLUCONATE 0.12 % MT SOLN
15.0000 mL | Freq: Once | OROMUCOSAL | Status: AC
  Administered 2023-10-27: 15 mL via OROMUCOSAL

## 2023-10-27 MED ORDER — FENTANYL CITRATE (PF) 100 MCG/2ML IJ SOLN
INTRAMUSCULAR | Status: AC
Start: 2023-10-27 — End: 2023-10-27
  Filled 2023-10-27: qty 2

## 2023-10-27 MED ORDER — HYDROMORPHONE HCL 1 MG/ML IJ SOLN
INTRAMUSCULAR | Status: AC
  Filled 2023-10-27: qty 1

## 2023-10-27 MED ORDER — FENTANYL CITRATE (PF) 100 MCG/2ML IJ SOLN
INTRAMUSCULAR | Status: DC | PRN
  Administered 2023-10-27 (×2): 50 ug via INTRAVENOUS

## 2023-10-27 MED ORDER — FENTANYL CITRATE PF 50 MCG/ML IJ SOSY
PREFILLED_SYRINGE | INTRAMUSCULAR | Status: AC
  Administered 2023-10-27: 50 ug via INTRAVENOUS
  Filled 2023-10-27: qty 2

## 2023-10-27 MED ORDER — ONDANSETRON HCL 4 MG/2ML IJ SOLN
INTRAMUSCULAR | Status: AC
  Filled 2023-10-27: qty 2

## 2023-10-27 MED ORDER — OXYCODONE HCL 5 MG/5ML PO SOLN: 5.0000 mg | Freq: Once | ORAL | Status: AC | PRN

## 2023-10-27 MED ORDER — MIDAZOLAM HCL 2 MG/2ML IJ SOLN
INTRAMUSCULAR | Status: AC
  Filled 2023-10-27: qty 2

## 2023-10-27 MED ORDER — ACETAMINOPHEN 500 MG PO TABS
1000.0000 mg | ORAL_TABLET | Freq: Once | ORAL | Status: AC
  Administered 2023-10-27: 1000 mg via ORAL
  Filled 2023-10-27: qty 2

## 2023-10-27 MED ORDER — LIDOCAINE 2% (20 MG/ML) 5 ML SYRINGE
INTRAMUSCULAR | Status: DC | PRN
  Administered 2023-10-27: 40 mg via INTRAVENOUS

## 2023-10-27 SURGICAL SUPPLY — 11 items
BAG COUNTER SPONGE SURGICOUNT (BAG) IMPLANT
BNDG ADH 1X3 SHEER STRL LF (GAUZE/BANDAGES/DRESSINGS) IMPLANT
COVER SURGICAL LIGHT HANDLE (MISCELLANEOUS) ×2 IMPLANT
GAUZE SPONGE 4X4 12PLY STRL (GAUZE/BANDAGES/DRESSINGS) IMPLANT
GLOVE BIOGEL PI IND STRL 8 (GLOVE) ×2 IMPLANT
GLOVE ECLIPSE 8.0 STRL XLNG CF (GLOVE) ×2 IMPLANT
GOWN STRL REUS W/ TWL XL LVL3 (GOWN DISPOSABLE) ×2 IMPLANT
NDL SAFETY ECLIPSE 18X1.5 (NEEDLE) IMPLANT
PROTECTOR NERVE ULNAR (MISCELLANEOUS) ×2 IMPLANT
SYR CONTROL 10ML LL (SYRINGE) IMPLANT
TOWEL OR 17X26 10 PK STRL BLUE (TOWEL DISPOSABLE) ×4 IMPLANT

## 2023-10-27 NOTE — Anesthesia Postprocedure Evaluation (Signed)
 Anesthesia Post Note  Patient: [name]  Procedure(s) Performed: MANIPULATION, KNEE, CLOSED (Right: Knee)     Patient location during evaluation: PACU Anesthesia Type: Regional and General Level of consciousness: awake and alert, oriented and patient cooperative Pain management: pain level controlled Vital Signs Assessment: post-procedure vital signs reviewed and stable Respiratory status: spontaneous breathing, nonlabored ventilation and respiratory function stable Cardiovascular status: blood pressure returned to baseline and stable Postop Assessment: no apparent nausea or vomiting Anesthetic complications: no   No notable events documented.  Last Vitals:  Vitals:   10/27/23 1148 10/27/23 1200  BP:  132/69  Pulse: 88 85  Resp: 18 15  Temp:    SpO2: 100% 96%    Last Pain:  Vitals:   10/27/23 1200  TempSrc:   PainSc: 7                  Jacquelyne Matte

## 2023-10-27 NOTE — Anesthesia Procedure Notes (Addendum)
 Procedure Name: General with mask airway Date/Time: 10/27/2023 11:20 AM  Performed by: Alwyn Juba, CRNAPre-anesthesia Checklist: Patient identified, Emergency Drugs available, Suction available, Patient being monitored and Timeout performed Patient Re-evaluated:Patient Re-evaluated prior to induction Oxygen Delivery Method: Circle system utilized Preoxygenation: Pre-oxygenation with 100% oxygen Induction Type: IV induction Ventilation: Mask ventilation without difficulty Placement Confirmation: positive ETCO2

## 2023-10-27 NOTE — Discharge Instructions (Signed)
 Increase your activities is much as possible and get your knee bending is much as possible multiple times a day. Resume physical therapy tomorrow. Ice and elevation as needed for swelling

## 2023-10-27 NOTE — Transfer of Care (Signed)
 Immediate Anesthesia Transfer of Care Note  Patient: Leah Hampton  Procedure(s) Performed: MANIPULATION, KNEE, CLOSED (Right: Knee)  Patient Location: PACU  Anesthesia Type:General  Level of Consciousness: drowsy and responds to stimulation  Airway & Oxygen Therapy: Patient Spontanous Breathing and Patient connected to face mask oxygen  Post-op Assessment: Report given to RN and Post -op Vital signs reviewed and stable  Post vital signs: Reviewed and stable  Last Vitals:  Vitals Value Taken Time  BP 111/57 10/27/23 11:32  Temp    Pulse 72 10/27/23 11:36  Resp 13 10/27/23 11:36  SpO2 100 % 10/27/23 11:36  Vitals shown include unfiled device data.  Last Pain:  Vitals:   10/27/23 1003  TempSrc: Oral  PainSc: 0-No pain         Complications: No notable events documented.

## 2023-10-27 NOTE — Op Note (Signed)
 Operative Note  Date of operation: 10/27/2023 Preoperative diagnosis: Right knee arthrofibrosis status post total knee arthroplasty Postoperative diagnosis: Same  Procedure: Right knee manipulation under anesthesia  Surgeon: Jeanella Milan. Lucienne Ryder, MD  Anesthesia: right lower extremity regional block, mask ventilation IV sedation, local  Complications none  Indications: The patient is a 68 year old female who is 8 weeks out from a right total knee arthroplasty to treat severe right knee arthritis.  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  At this point we have recommended a manipulation under anesthesia of the knee joint.  We explained in detail the risks and benefits of the surgery including the risk of fracture and continued stiffness.  She understands pushing her knee hard through physical therapy after this is appropriate and important for her to do multiple times a day.  She resumes outpatient physical therapy tomorrow.  Procedure description: After informed consent was obtained and the appropriate right knee was marked, anesthesia obtained a right lower extremity block in the holding room.  The patient was then brought to the operating room on a stretcher.  Mask ventilation and IV sedation was obtained.  A timeout was called and she was then defy the correct patient correct right knee.  Premanipulation her flexion was to maybe 60 degrees.  I was then able to slowly and gently manipulate her knee and flex her knee to past 100 degrees.  I then put her through several cycles of flexion extension of that knee and it really loosened up well and it felt good throughout the arc of motion.  I then cleaned the knee with alcohol swabs  and placed a mixture of 4 cc of plain Marcaine  combined with 1 cc of 40 of Depo-Medrol .  A Band-Aid was placed over this.  She was then taken to the recovery room in  stable condition.  Postop Merlyn Starring should be discharged to home with continuing aggressive physical therapy on her right knee.

## 2023-10-27 NOTE — Interval H&P Note (Signed)
 History and Physical Interval Note: The patient understands that she is here today for a right knee manipulation under anesthesia secondary to arthrofibrosis and stiffness from her right total knee arthroplasty.  There has been no acute or interval change in her medical status.  The risks and benefits of this procedure have been discussed in detail and informed consent has been obtained.  The right operative knee has been marked.  10/27/2023 10:36 AM  Leah Hampton  has presented today for surgery, with the diagnosis of right knee arthrofibrosis s/p total knee arthroplasty.  The various methods of treatment have been discussed with the patient and family. After consideration of risks, benefits and other options for treatment, the patient has consented to  Procedure(s): MANIPULATION, KNEE, CLOSED (Right) as a surgical intervention.  The patient's history has been reviewed, patient examined, no change in status, stable for surgery.  I have reviewed the patient's chart and labs.  Questions were answered to the patient's satisfaction.     Arnie Lao

## 2023-10-27 NOTE — Anesthesia Procedure Notes (Signed)
 Anesthesia Regional Block: Adductor canal block   Pre-Anesthetic Checklist: , timeout performed,  Correct Patient, Correct Site, Correct Laterality,  Correct Procedure, Correct Position, site marked,  Risks and benefits discussed,  Surgical consent,  Pre-op evaluation,  At surgeon's request and post-op pain management  Laterality: Right  Prep: Maximum Sterile Barrier Precautions used, chloraprep       Needles:  Injection technique: Single-shot  Needle Type: Echogenic Stimulator Needle     Needle Length: 9cm  Needle Gauge: 22     Additional Needles:   Procedures:,,,, ultrasound used (permanent image in chart),,    Narrative:  Start time: 10/27/2023 10:30 AM End time: 10/27/2023 10:35 AM Injection made incrementally with aspirations every 5 mL.  Performed by: Personally  Anesthesiologist: Jacquelyne Matte, DO  Additional Notes: Monitors applied. No increased pain on injection. No increased resistance to injection. Injection made in 5cc increments. Good needle visualization. Patient tolerated procedure well.

## 2023-10-28 ENCOUNTER — Encounter (HOSPITAL_COMMUNITY): Payer: Self-pay | Admitting: Orthopaedic Surgery

## 2023-10-28 ENCOUNTER — Ambulatory Visit (INDEPENDENT_AMBULATORY_CARE_PROVIDER_SITE_OTHER): Admitting: Rehabilitative and Restorative Service Providers"

## 2023-10-28 DIAGNOSIS — M25661 Stiffness of right knee, not elsewhere classified: Secondary | ICD-10-CM

## 2023-10-28 DIAGNOSIS — M25561 Pain in right knee: Secondary | ICD-10-CM

## 2023-10-28 DIAGNOSIS — R600 Localized edema: Secondary | ICD-10-CM

## 2023-10-28 NOTE — Therapy (Signed)
 OUTPATIENT PHYSICAL THERAPY TREATMENT/RE-CERTIFICATION NOTE   Patient Name: Leah Hampton MRN: [id number] DOB:Oct 19, 1955, 68 y.o., female Today's Date: 10/28/2023   END OF SESSION:  PT End of Session - 10/28/23 1600     Visit Number 16    Number of Visits 24    Date for PT Re-Evaluation 11/25/23    Authorization Type [employer] Medicare Dual Complete    Authorization Time Period No authorization needed    Progress Note Due on Visit 20    PT Start Time 1515    PT Stop Time 1609    PT Time Calculation (min) 54 min    Activity Tolerance Patient tolerated treatment well;No increased pain;Patient limited by pain    Behavior During Therapy [facility] for tasks assessed/performed                   Past Medical History:  Diagnosis Date   Bronchitis    Chronic bronchitis (HCC)    Cystitis    Hematuria    urologice eval, Dr Clifton Dames   History of chest pain    Hyperbilirubinemia    Hyperlipemia    Hypertelorism    Hypertension    Leukocytosis    Obesity    Osteoarthritis    right knee   Pre-diabetes    Sleep apnea 2012   slight   no CPAP   Vaginal Pap smear, abnormal    Vitamin D  deficiency 02/09/2018   Past Surgical History:  Procedure Laterality Date   CERVICAL POLYPECTOMY  07/15/2020   Procedure: POLYPECTOMY with Myosure;  Surgeon: Lenord Radon, MD;  Location: [facility] OR;  Service: Gynecology;;   COLONOSCOPY WITH PROPOFOL  N/A 03/27/2015   Procedure: COLONOSCOPY WITH PROPOFOL ;  Surgeon: Janel Medford, MD;  Location: [facility];  Service: Endoscopy;  Laterality: N/A;   COLONOSCOPY WITH PROPOFOL  N/A 10/02/2020   Procedure: COLONOSCOPY WITH PROPOFOL ;  Surgeon: Janel Medford, MD;  Location: [facility];  Service: Endoscopy;  Laterality: N/A;   COLPOSCOPY N/A 07/15/2020   Procedure: COLPOSCOPY;  Surgeon: Lenord Radon, MD;  Location: [facility] OR;  Service: Gynecology;  Laterality: N/A;   DILATATION & CURETTAGE/HYSTEROSCOPY WITH MYOSURE N/A 01/05/2017    Procedure: DILATATION & CURETTAGE/HYSTEROSCOPY WITH MYOSURE;  Surgeon: Johnn Najjar, MD;  Location: [facility] ORS;  Service: Gynecology;  Laterality: N/A;  PostMenopausal Bleeding   HYSTEROSCOPY WITH D & C N/A 05/22/2019   Procedure: DILATATION AND CURETTAGE /HYSTEROSCOPY, POLYPECTOMY WITH MYOSURE;  Surgeon: Lenord Radon, MD;  Location: [facility] OR[facility];  Service: Gynecology;  Laterality: N/A;   HYSTEROSCOPY WITH D & C N/A 07/15/2020   Procedure: DILATATION AND CURETTAGE /HYSTEROSCOPY;  Surgeon: Lenord Radon, MD;  Location: [facility] OR;  Service: Gynecology;  Laterality: N/A;   KNEE ARTHROSCOPY  04/2007   left knee   KNEE CLOSED REDUCTION Right 10/27/2023   Procedure: MANIPULATION, KNEE, CLOSED;  Surgeon: Arnie Lao, MD;  Location: [facility];  Service: Orthopedics;  Laterality: Right;   LAPAROSCOPIC GASTRIC SLEEVE RESECTION     TOTAL KNEE ARTHROPLASTY Left 10/2008   Dr Franz Jacks   TOTAL KNEE ARTHROPLASTY Right 08/26/2023   Procedure: ARTHROPLASTY, KNEE, TOTAL;  Surgeon: Arnie Lao, MD;  Location: [facility];  Service: Orthopedics;  Laterality: Right;   UPPER GI ENDOSCOPY N/A 10/14/2020   Procedure: UPPER GI ENDOSCOPY;  Surgeon: Aldean Hummingbird, MD;  Location: [facility];  Service: General;  Laterality: N/A;   Patient Active Problem List   Diagnosis Date Noted   Arthrofibrosis of knee joint, right 10/12/2023   Status post  total right knee replacement 08/26/2023   Sleep disturbance 01/21/2023   Lactose intolerance 10/22/2022   S/P gastric sleeve procedure 11/04/2020   Uncomplicated asthma 11/04/2020   Gastroesophageal reflux disease 11/04/2020   Primary osteoarthritis of both hips 09/30/2020   History of total left knee replacement 07/29/2020   Endometrial polyp 07/15/2020   Low grade squamous intraepithelial lesion (LGSIL) on cervical Pap smear 07/07/2020   Upper abdominal pain 08/20/2019   Right hip pain 05/28/2019   Post-menopausal bleeding 05/16/2019   Greater trochanteric  pain syndrome of left lower extremity 03/20/2019   Type 2 diabetes mellitus without complication, without long-term current use of insulin  (HCC) 05/18/2018   Vitamin D  deficiency 02/09/2018   Onychomycosis 09/19/2017   Tinea pedis of both feet 09/19/2017   Obstructive chronic bronchitis with exacerbation (HCC) 05/23/2017   Mixed hyperlipidemia 05/01/2014   Genital herpes 05/23/2012   Stress incontinence 03/04/2012   Unilateral primary osteoarthritis, right knee 10/12/2011   CTS (carpal tunnel syndrome) 07/13/2011   OSA (obstructive sleep apnea) 12/10/2010   Severe obesity (BMI >= 40) (HCC) 07/31/2008   Essential hypertension 07/03/2008    PCP: Dorrene Gaucher, NP  REFERRING PROVIDER: Arnie Lao, MD  REFERRING DIAG: M17.11 (ICD-10-CM) - Unilateral primary osteoarthritis, right knee  Rationale for Evaluation and Treatment: Rehabilitation  THERAPY DIAG:  Acute pain of right knee - Plan: PT plan of care cert/re-cert  Stiffness of right knee, not elsewhere classified - Plan: PT plan of care cert/re-cert  Localized edema - Plan: PT plan of care cert/re-cert  ONSET DATE: 08/26/23 (DOS)   SUBJECTIVE:                                                                                                                                                                                           SUBJECTIVE STATEMENT: Chantella had her manipulation yesterday.    PERTINENT HISTORY:  besity, HTN, HLD, DM, OA, Lt TKA in 2010, s/p gastric sleeve resection 2022  PAIN:  NPRS scale: 3-5/10 since manipulation yesterday Pain location: Rt knee Pain description: Tight Aggravating factors: Knee flexion Relieving factors: Prescription pain medication, ice  PRECAUTIONS:  None  RED FLAGS: None   WEIGHT BEARING RESTRICTIONS:  No  FALLS:  Has patient fallen in last 6 months? No  LIVING ENVIRONMENT: Lives with: lives alone Lives in: House/apartment Stairs: Yes: External: 16  steps; can reach both Has following equipment at home: Walker - 4 wheeled  OCCUPATION:  On disability  PLOF:  Independent and Leisure: watching TV, shopping, has a fitness center at her apartment complex  PATIENT GOALS:  Improve function in knee, get back to gym  OBJECTIVE:  Note: Objective measures were completed at Evaluation unless otherwise noted.  PATIENT SURVEYS:  Patient-Specific Activity Scoring Scheme  0 represents "unable to perform." 10 represents "able to perform at prior level. 0 1 2 3 4 5 6 7 8 9  10 (Date and Score)   Activity Eval  10/11/23   1. Walking 2 5   2. Bending the knee 2  5  3. Extending the knee 2 8  Score 2 6   Total score = sum of the activity scores/number of activities Minimum detectable change (90%CI) for average score = 2 points Minimum detectable change (90%CI) for single activity score = 3 points    COGNITIVE STATUS: Within functional limits for tasks assessed   SENSATION: WFL  GAIT: 09/08/23 Distance walked: 150' within clinic Assistive device utilized: Environmental consultant - 4 wheeled Level of assistance: Modified independence Comments: decreased stance on Rt, decr hip/knee flexion on Rt   EDEMA: 09/08/23  Knee joint line: Rt: 71 cm/Lt: 48.5 cm 09/19/23: knee joint line: Rt: 59.5 centimeters   LOWER EXTREMITY ROM:     ROM Right eval Left eval Rt 09/13/23 Rt 09/19/23 Right 09/21/23 Right 09/30/23 Rt 10/03/23 Rt 10/11/23 Rt 10/17/23 Right 10/28/2023  Knee flexion A: 21 P: 41 A: 108 A: 48 P: 60 A: 50 P: 60 P: 71* P 56* AA: 50 P: 58 AA:58 P: 60 AA: 60 AAROM 92  Knee extension A: 8 (seated LAQ) P: 0 A: 0 A: 10 P: 8 A: 10 P: 6   A: -7 P: -5 A: -5 P: -2 A: -5 -5   (Blank rows = not tested)   LOWER EXTREMITY MMT:   09/08/2023 Not formally tested at eval; Rt knee grossly 3-/5  MMT Right 09/28/2023 Rt 10/03/23 Seated HHD 10/17/23 Rt Seated  HHD  Hip flexion     Hip extension     Hip abduction     Hip adduction     Hip  internal rotation     Hip external rotation     Knee flexion  18.7 22.4 19.2 25.4  Knee extension 4/5 20.5 21.3 30.6 33.1  Ankle dorsiflexion     Ankle plantarflexion     Ankle inversion     Ankle eversion      (Blank rows = not tested)                  Today's Treatment 10/28/2023 Recumbent bike Seat 5 AAROM to full range 10 x backwards and 20 x forwards (10 minutes total) Seated knee flexion AAROM with PT overpressure 2 rounds of 10 x 10 seconds Supine knee flexion with strap and quad sets between reps 10 x 5 seconds each  Functional Activities: Double Leg Press 75# 10 x full range and stretch into flexion Single Leg Press 37# 10 x full range and stretch into flexion  Vaso Right Knee High 34* 10 minutes   10/17/23 Therex Nustep: level 5 x 8 minutes UE/LE Standing toe/heel raises x 15 c UE support Supine feet on pball knee extended bridge 2x10 Theract Step ups on 4 inch step x 15  Sit to stand: 2 x 10 from 18 inch heigh chair using no UE 's  Double leg press 81# 3 x 10 Single leg press within shortened range for terminal knee ext 62# 2x10   10/13/23 Therex Scifit bike: partial revolutions to full revolutions seat at 12 x 5 minutes; seat 10 x 3 min Supine feet on pball hamstring curl 2x10 Supine feet  on pball quad set 2x10 Supine feet on pball knee extended bridge 2x10 Theract Supine feet on pball knee flexion/ext 2x10 strap behind knee for increased flexion and hamstring activation Stand to sit x10 from elevated bed Double leg press 75# 2x10 Single leg press within shortened range for terminal knee ext 62# 2x10 Modalities Vasopneumatic medium compression, Rt LE elevated on bolster, x 10 minutes; 34 deg   PATIENT EDUCATION:  Education details: HEP Person educated: Patient Education method: Programmer, multimedia, Facilities manager, and Handouts Education comprehension: verbalized understanding, returned demonstration, and needs further education  HOME EXERCISE  PROGRAM: Access Code: [email or web address] URL: [email or web address] Date: 10/28/2023 Prepared by: Terral Ferrari  Exercises - Ankle Alphabet in Elevation  - 10 x daily - 7 x weekly - 1 sets - 10 reps - Quad Set  - 5-10 x daily - 7 x weekly - 1 sets - 5-10 reps - 5 sec hold - Seated Knee Flexion Extension AROM   - 5-10 x daily - 7 x weekly - 1 sets - 5-10 reps - 5 sec  hold - Supine Bridge  - 2 x daily - 7 x weekly - 2 sets - 10 reps - 3 seconds hold - Sit to Stand with Counter Support  - 2 x daily - 7 x weekly - 2 sets - 10 reps - Seated Heel Slide  - 2 x daily - 7 x weekly - 2 sets - 10 reps - Seated Knee Flexion AAROM  - 10 x daily - 7 x weekly - 1 sets - 10 reps - 10 second hold   ASSESSMENT:  CLINICAL IMPRESSION: Objectively, active range of motion was assessed at 0 - 5 -92 degrees today.  This appears significantly better than the 60 degrees she had at her last supervised physical therapy appointment less than 2 weeks ago.  Dr. Arvella Bird note says he was able to get over 100 degrees in the manipulation yesterday.  Ashely did have some edema today that may have prevented her a bit and her extension active range of motion was lacking 5 degrees as well.  As edema subsides and if the neck continues to give good effort with her home and clinic program as she did today, her prognosis to improve extension active range of motion to within 3 degrees of full extension and flexion active range of motion to 95 degrees or better is good.  OBJECTIVE IMPAIRMENTS: Abnormal gait, decreased balance, decreased endurance, decreased knowledge of use of DME, decreased mobility, difficulty walking, decreased ROM, decreased strength, hypomobility, increased edema, increased fascial restrictions, increased muscle spasms, and pain.    GOALS: Goals reviewed with patient? Yes  SHORT TERM GOALS: Target date: 10/06/2023  Independent with initial HEP Goal status: MET 10/03/23  2.  Rt knee ROM improved to  0-90 for improved mobility Goal status: Partially met 10/28/2023   LONG TERM GOALS: Target date: 11/25/2023  Independent with final HEP Goal status: Ongoing  10/28/2023  2.  PSFS score improved by 3 points Goal status: MET 10/11/23  3.  Rt knee AROM improved to 0-100 for improved function and mobility Goal status:  Ongoing  10/28/2023  4.  Report pain < 4/10 with standing and walking for improved function Goal status: Ongoing  10/28/2023  5.  Amb without AD without significant deviations for improved community access Goal status: Ongoing  10/28/2023    PLAN:  PT FREQUENCY: 3-5x/wk  PT DURATION: 4 additional weeks  PLANNED INTERVENTIONS: 97164- PT Re-evaluation, 97750- Physical Performance Testing, 97110-Therapeutic exercises, 97530-  Therapeutic activity, [id number]- Neuromuscular re-education, [phone]- Self Care, 62130- Manual therapy, [phone]- Gait training, [phone]- Aquatic Therapy, [phone]- Electrical stimulation (unattended), [phone]- Vasopneumatic device, Patient/Family education, Balance training, Stair training, Taping, Dry Needling, Joint mobilization, DME instructions, Cryotherapy, and Moist heat.  PLAN FOR NEXT SESSION: Heavy emphasis on flexion active range of motion.  Quadricep strengthening, gait and balance activities as appropriate.     Joli Neas, PT, MPT 10/28/23 4:22 PM

## 2023-10-31 ENCOUNTER — Encounter: Payer: Self-pay | Admitting: Rehabilitative and Restorative Service Providers"

## 2023-10-31 ENCOUNTER — Ambulatory Visit (INDEPENDENT_AMBULATORY_CARE_PROVIDER_SITE_OTHER): Admitting: Rehabilitative and Restorative Service Providers"

## 2023-10-31 DIAGNOSIS — R600 Localized edema: Secondary | ICD-10-CM | POA: Diagnosis not present

## 2023-10-31 DIAGNOSIS — M25661 Stiffness of right knee, not elsewhere classified: Secondary | ICD-10-CM | POA: Diagnosis not present

## 2023-10-31 DIAGNOSIS — M25561 Pain in right knee: Secondary | ICD-10-CM | POA: Diagnosis not present

## 2023-10-31 NOTE — Therapy (Signed)
 OUTPATIENT PHYSICAL THERAPY TREATMENT   Patient Name: Leah Hampton MRN: [id number] DOB:08/05/1955, 68 y.o., female Today's Date: 10/31/2023   END OF SESSION:  PT End of Session - 10/31/23 1200     Visit Number 17    Number of Visits 24    Date for PT Re-Evaluation 11/25/23    Authorization Type [employer] Medicare Dual Complete    Authorization Time Period No authorization needed    Progress Note Due on Visit 24    PT Start Time 1143    PT Stop Time 1232    PT Time Calculation (min) 49 min    Activity Tolerance Patient tolerated treatment well;No increased pain    Behavior During Therapy [facility] for tasks assessed/performed                    Past Medical History:  Diagnosis Date   Bronchitis    Chronic bronchitis (HCC)    Cystitis    Hematuria    urologice eval, Dr Clifton Dames   History of chest pain    Hyperbilirubinemia    Hyperlipemia    Hypertelorism    Hypertension    Leukocytosis    Obesity    Osteoarthritis    right knee   Pre-diabetes    Sleep apnea 2012   slight   no CPAP   Vaginal Pap smear, abnormal    Vitamin D  deficiency 02/09/2018   Past Surgical History:  Procedure Laterality Date   CERVICAL POLYPECTOMY  07/15/2020   Procedure: POLYPECTOMY with Myosure;  Surgeon: Lenord Radon, MD;  Location: [facility] OR;  Service: Gynecology;;   COLONOSCOPY WITH PROPOFOL  N/A 03/27/2015   Procedure: COLONOSCOPY WITH PROPOFOL ;  Surgeon: Janel Medford, MD;  Location: [facility];  Service: Endoscopy;  Laterality: N/A;   COLONOSCOPY WITH PROPOFOL  N/A 10/02/2020   Procedure: COLONOSCOPY WITH PROPOFOL ;  Surgeon: Janel Medford, MD;  Location: [facility];  Service: Endoscopy;  Laterality: N/A;   COLPOSCOPY N/A 07/15/2020   Procedure: COLPOSCOPY;  Surgeon: Lenord Radon, MD;  Location: [facility] OR;  Service: Gynecology;  Laterality: N/A;   DILATATION & CURETTAGE/HYSTEROSCOPY WITH MYOSURE N/A 01/05/2017   Procedure: DILATATION & CURETTAGE/HYSTEROSCOPY  WITH MYOSURE;  Surgeon: Johnn Najjar, MD;  Location: [facility];  Service: Gynecology;  Laterality: N/A;  PostMenopausal Bleeding   HYSTEROSCOPY WITH D & C N/A 05/22/2019   Procedure: DILATATION AND CURETTAGE /HYSTEROSCOPY, POLYPECTOMY WITH MYOSURE;  Surgeon: Lenord Radon, MD;  Location: [facility];  Service: Gynecology;  Laterality: N/A;   HYSTEROSCOPY WITH D & C N/A 07/15/2020   Procedure: DILATATION AND CURETTAGE /HYSTEROSCOPY;  Surgeon: Lenord Radon, MD;  Location: [facility] OR;  Service: Gynecology;  Laterality: N/A;   KNEE ARTHROSCOPY  04/2007   left knee   KNEE CLOSED REDUCTION Right 10/27/2023   Procedure: MANIPULATION, KNEE, CLOSED;  Surgeon: Arnie Lao, MD;  Location: [facility];  Service: Orthopedics;  Laterality: Right;   LAPAROSCOPIC GASTRIC SLEEVE RESECTION     TOTAL KNEE ARTHROPLASTY Left 10/2008   Dr Franz Jacks   TOTAL KNEE ARTHROPLASTY Right 08/26/2023   Procedure: ARTHROPLASTY, KNEE, TOTAL;  Surgeon: Arnie Lao, MD;  Location: [facility];  Service: Orthopedics;  Laterality: Right;   UPPER GI ENDOSCOPY N/A 10/14/2020   Procedure: UPPER GI ENDOSCOPY;  Surgeon: Aldean Hummingbird, MD;  Location: [facility];  Service: General;  Laterality: N/A;   Patient Active Problem List   Diagnosis Date Noted   Arthrofibrosis of knee joint, right 10/12/2023   Status post total right knee  replacement 08/26/2023   Sleep disturbance 01/21/2023   Lactose intolerance 10/22/2022   S/P gastric sleeve procedure 11/04/2020   Uncomplicated asthma 11/04/2020   Gastroesophageal reflux disease 11/04/2020   Primary osteoarthritis of both hips 09/30/2020   History of total left knee replacement 07/29/2020   Endometrial polyp 07/15/2020   Low grade squamous intraepithelial lesion (LGSIL) on cervical Pap smear 07/07/2020   Upper abdominal pain 08/20/2019   Right hip pain 05/28/2019   Post-menopausal bleeding 05/16/2019   Greater trochanteric pain syndrome of left lower extremity  03/20/2019   Type 2 diabetes mellitus without complication, without long-term current use of insulin  (HCC) 05/18/2018   Vitamin D  deficiency 02/09/2018   Onychomycosis 09/19/2017   Tinea pedis of both feet 09/19/2017   Obstructive chronic bronchitis with exacerbation (HCC) 05/23/2017   Mixed hyperlipidemia 05/01/2014   Genital herpes 05/23/2012   Stress incontinence 03/04/2012   Unilateral primary osteoarthritis, right knee 10/12/2011   CTS (carpal tunnel syndrome) 07/13/2011   OSA (obstructive sleep apnea) 12/10/2010   Severe obesity (BMI >= 40) (HCC) 07/31/2008   Essential hypertension 07/03/2008    PCP: Dorrene Gaucher, NP  REFERRING PROVIDER: Arnie Lao, MD  REFERRING DIAG: M17.11 (ICD-10-CM) - Unilateral primary osteoarthritis, right knee  Rationale for Evaluation and Treatment: Rehabilitation  THERAPY DIAG:  Acute pain of right knee  Stiffness of right knee, not elsewhere classified  Localized edema  ONSET DATE: 08/26/23 (DOS)   SUBJECTIVE:                                                                                                                                                                                           SUBJECTIVE STATEMENT: Pt indicated feeling sore increase and reported feeling some improvement today.  Reported 2-3/10 upon arrival.  Pt indicated not taking as many pain pills.   PERTINENT HISTORY:  besity, HTN, HLD, DM, OA, Lt TKA in 2010, s/p gastric sleeve resection 2022  PAIN:  NPRS scale: 2-3/10 upon arrival  Pain location: Rt knee Pain description: sore  Aggravating factors: Knee flexion Relieving factors: Prescription pain medication, ice  PRECAUTIONS:  None  RED FLAGS: None   WEIGHT BEARING RESTRICTIONS:  No  FALLS:  Has patient fallen in last 6 months? No  LIVING ENVIRONMENT: Lives with: lives alone Lives in: House/apartment Stairs: Yes: External: 16 steps; can reach both Has following equipment at  home: Walker - 4 wheeled  OCCUPATION:  On disability  PLOF:  Independent and Leisure: watching TV, shopping, has a fitness center at her apartment complex  PATIENT GOALS:  Improve function in knee, get back to gym  OBJECTIVE:  Note: Objective  measures were completed at Evaluation unless otherwise noted.  PATIENT SURVEYS:  Patient-Specific Activity Scoring Scheme  0 represents "unable to perform." 10 represents "able to perform at prior level. 0 1 2 3 4 5 6 7 8 9  10 (Date and Score)   Activity Eval  10/11/23   1. Walking 2 5   2. Bending the knee 2  5  3. Extending the knee 2 8  Score 2 6   Total score = sum of the activity scores/number of activities Minimum detectable change (90%CI) for average score = 2 points Minimum detectable change (90%CI) for single activity score = 3 points    COGNITIVE STATUS: Within functional limits for tasks assessed   SENSATION: WFL  GAIT: 09/08/23 Distance walked: 150' within clinic Assistive device utilized: Environmental consultant - 4 wheeled Level of assistance: Modified independence Comments: decreased stance on Rt, decr hip/knee flexion on Rt   EDEMA: 09/08/23  Knee joint line: Rt: 71 cm/Lt: 48.5 cm 09/19/23: knee joint line: Rt: 59.5 centimeters   LOWER EXTREMITY ROM:     ROM Right eval Left eval Rt 09/13/23 Rt 09/19/23 Right 09/21/23 Right 09/30/23 Rt 10/03/23 Rt 10/11/23 Rt 10/17/23 Right 10/28/2023 Right 10/31/2023  Knee flexion A: 21 P: 41 A: 108 A: 48 P: 60 A: 50 P: 60 P: 71* P 56* AA: 50 P: 58 AA:58 P: 60 AA: 60 AAROM 92 Supine AROM heel slide:  82  Supine AAROM heel slide: 92     Knee extension A: 8 (seated LAQ) P: 0 A: 0 A: 10 P: 8 A: 10 P: 6   A: -7 P: -5 A: -5 P: -2 A: -5 -5    (Blank rows = not tested)   LOWER EXTREMITY MMT:   09/08/2023 Not formally tested at eval; Rt knee grossly 3-/5  MMT Right 09/28/2023 Rt 10/03/23 Seated HHD 10/17/23 Rt Seated  HHD  Hip flexion     Hip extension     Hip abduction      Hip adduction     Hip internal rotation     Hip external rotation     Knee flexion  18.7 22.4 19.2 25.4  Knee extension 4/5 20.5 21.3 30.6 33.1  Ankle dorsiflexion     Ankle plantarflexion     Ankle inversion     Ankle eversion      (Blank rows = not tested)                   Today's Treatment       DATE: 10/31/2023 Therex: Nustep with focus on knee flexion mobility to tolerance 10 mins UE/LE  Seated Rt leg LAQ with end range pauses 2 x 15 Seated Rt knee flexion c Lt leg overpressure 15 sec hold x 10  Supine heel prop education for extension, performed 1 min Incline gastroc stretch 30 sec x 3 bilateral   TherActivity Sit to stand to sit 23 inch table height no UE assist x15   Manual: Seated Rt knee flexion c distraction c movement /IR for mobility gains.  Cross friction massage performed for tissue mobility.  Cues for home use given.   Vaso 10 mins Rt knee in elevation medium compression 34 deg.    Today's Treatment       DATE: 10/28/2023 Recumbent bike Seat 5 AAROM to full range 10 x backwards and 20 x forwards (10 minutes total) Seated knee flexion AAROM with PT overpressure 2 rounds of 10 x 10 seconds Supine knee flexion with  strap and quad sets between reps 10 x 5 seconds each  Functional Activities: Double Leg Press 75# 10 x full range and stretch into flexion Single Leg Press 37# 10 x full range and stretch into flexion  Vaso Right Knee High 34* 10 minutes   Today's Treatment       DATE:10/17/23 Therex Nustep: level 5 x 8 minutes UE/LE Standing toe/heel raises x 15 c UE support Supine feet on pball knee extended bridge 2x10 Theract Step ups on 4 inch step x 15  Sit to stand: 2 x 10 from 18 inch heigh chair using no UE 's  Double leg press 81# 3 x 10 Single leg press within shortened range for terminal knee ext 62# 2x10   Today's Treatment       DATE:10/13/23 Therex Scifit bike: partial revolutions to full revolutions seat at 12 x 5 minutes; seat 10 x  3 min Supine feet on pball hamstring curl 2x10 Supine feet on pball quad set 2x10 Supine feet on pball knee extended bridge 2x10 Theract Supine feet on pball knee flexion/ext 2x10 strap behind knee for increased flexion and hamstring activation Stand to sit x10 from elevated bed Double leg press 75# 2x10 Single leg press within shortened range for terminal knee ext 62# 2x10 Modalities Vasopneumatic medium compression, Rt LE elevated on bolster, x 10 minutes; 34 deg   PATIENT EDUCATION:  Education details: HEP Person educated: Patient Education method: Programmer, multimedia, Facilities manager, and Handouts Education comprehension: verbalized understanding, returned demonstration, and needs further education  HOME EXERCISE PROGRAM: Access Code: [email or web address] URL: [email or web address] Date: 10/28/2023 Prepared by: Terral Ferrari  Exercises - Ankle Alphabet in Elevation  - 10 x daily - 7 x weekly - 1 sets - 10 reps - Quad Set  - 5-10 x daily - 7 x weekly - 1 sets - 5-10 reps - 5 sec hold - Seated Knee Flexion Extension AROM   - 5-10 x daily - 7 x weekly - 1 sets - 5-10 reps - 5 sec  hold - Supine Bridge  - 2 x daily - 7 x weekly - 2 sets - 10 reps - 3 seconds hold - Sit to Stand with Counter Support  - 2 x daily - 7 x weekly - 2 sets - 10 reps - Seated Heel Slide  - 2 x daily - 7 x weekly - 2 sets - 10 reps - Seated Knee Flexion AAROM  - 10 x daily - 7 x weekly - 1 sets - 10 reps - 10 second hold   ASSESSMENT:  CLINICAL IMPRESSION: Quality of knee flexion in available range seemed to be improved today vs previous visit after manipulation.  Pt to benefit from continued skilled PT and HEP to promote mobility gains and functional strengthening.     OBJECTIVE IMPAIRMENTS: Abnormal gait, decreased balance, decreased endurance, decreased knowledge of use of DME, decreased mobility, difficulty walking, decreased ROM, decreased strength, hypomobility, increased edema, increased fascial  restrictions, increased muscle spasms, and pain.    GOALS: Goals reviewed with patient? Yes  SHORT TERM GOALS: Target date: 10/06/2023  Independent with initial HEP Goal status: MET 10/03/23  2.  Rt knee ROM improved to 0-90 for improved mobility Goal status: Partially met 10/28/2023   LONG TERM GOALS: Target date: 11/25/2023  Independent with final HEP Goal status: Ongoing  10/28/2023  2.  PSFS score improved by 3 points Goal status: MET 10/11/23  3.  Rt knee AROM improved to 0-100 for improved function and mobility Goal  status:  Ongoing  10/28/2023  4.  Report pain < 4/10 with standing and walking for improved function Goal status: Ongoing  10/28/2023  5.  Amb without AD without significant deviations for improved community access Goal status: Ongoing  10/28/2023    PLAN:  PT FREQUENCY: 3-5x/wk  PT DURATION: 4 additional weeks  PLANNED INTERVENTIONS: 97164- PT Re-evaluation, 97750- Physical Performance Testing, 97110-Therapeutic exercises, 97530- Therapeutic activity, 97112- Neuromuscular re-education, 97535- Self Care, 78295- Manual therapy, [phone]- Gait training, [phone]- Aquatic Therapy, [phone]- Electrical stimulation (unattended), 97016- Vasopneumatic device, Patient/Family education, Balance training, Stair training, Taping, Dry Needling, Joint mobilization, DME instructions, Cryotherapy, and Moist heat.  PLAN FOR NEXT SESSION: Flexion mobility gains.    Bonna Bustard, PT, DPT, OCS, ATC 10/31/23  12:23 PM

## 2023-11-01 ENCOUNTER — Ambulatory Visit (INDEPENDENT_AMBULATORY_CARE_PROVIDER_SITE_OTHER): Admitting: Rehabilitative and Restorative Service Providers"

## 2023-11-01 ENCOUNTER — Encounter: Payer: Self-pay | Admitting: Rehabilitative and Restorative Service Providers"

## 2023-11-01 DIAGNOSIS — M25561 Pain in right knee: Secondary | ICD-10-CM | POA: Diagnosis not present

## 2023-11-01 DIAGNOSIS — R600 Localized edema: Secondary | ICD-10-CM

## 2023-11-01 DIAGNOSIS — M25661 Stiffness of right knee, not elsewhere classified: Secondary | ICD-10-CM

## 2023-11-01 DIAGNOSIS — R2689 Other abnormalities of gait and mobility: Secondary | ICD-10-CM | POA: Diagnosis not present

## 2023-11-01 DIAGNOSIS — R2681 Unsteadiness on feet: Secondary | ICD-10-CM

## 2023-11-01 NOTE — Therapy (Signed)
 OUTPATIENT PHYSICAL THERAPY TREATMENT   Patient Name: Leah Hampton MRN: [id number] DOB:1955-11-19, 68 y.o., female Today's Date: 11/01/2023   END OF SESSION:  PT End of Session - 11/01/23 1115     Visit Number 18    Number of Visits 24    Date for PT Re-Evaluation 11/25/23    Authorization Type [employer] Medicare Dual Complete    Authorization Time Period No authorization needed    Progress Note Due on Visit 24    PT Start Time 1056    PT Stop Time 1145    PT Time Calculation (min) 49 min    Activity Tolerance Patient tolerated treatment well    Behavior During Therapy [facility] for tasks assessed/performed                     Past Medical History:  Diagnosis Date   Bronchitis    Chronic bronchitis (HCC)    Cystitis    Hematuria    urologice eval, Dr Clifton Dames   History of chest pain    Hyperbilirubinemia    Hyperlipemia    Hypertelorism    Hypertension    Leukocytosis    Obesity    Osteoarthritis    right knee   Pre-diabetes    Sleep apnea 2012   slight   no CPAP   Vaginal Pap smear, abnormal    Vitamin D  deficiency 02/09/2018   Past Surgical History:  Procedure Laterality Date   CERVICAL POLYPECTOMY  07/15/2020   Procedure: POLYPECTOMY with Myosure;  Surgeon: Lenord Radon, MD;  Location: [facility] OR;  Service: Gynecology;;   COLONOSCOPY WITH PROPOFOL  N/A 03/27/2015   Procedure: COLONOSCOPY WITH PROPOFOL ;  Surgeon: Janel Medford, MD;  Location: [facility];  Service: Endoscopy;  Laterality: N/A;   COLONOSCOPY WITH PROPOFOL  N/A 10/02/2020   Procedure: COLONOSCOPY WITH PROPOFOL ;  Surgeon: Janel Medford, MD;  Location: [facility];  Service: Endoscopy;  Laterality: N/A;   COLPOSCOPY N/A 07/15/2020   Procedure: COLPOSCOPY;  Surgeon: Lenord Radon, MD;  Location: [facility] OR;  Service: Gynecology;  Laterality: N/A;   DILATATION & CURETTAGE/HYSTEROSCOPY WITH MYOSURE N/A 01/05/2017   Procedure: DILATATION & CURETTAGE/HYSTEROSCOPY WITH MYOSURE;   Surgeon: Johnn Najjar, MD;  Location: [facility]S;  Service: Gynecology;  Laterality: N/A;  PostMenopausal Bleeding   HYSTEROSCOPY WITH D & C N/A 05/22/2019   Procedure: DILATATION AND CURETTAGE /HYSTEROSCOPY, POLYPECTOMY WITH MYOSURE;  Surgeon: Lenord Radon, MD;  Location: [facility];  Service: Gynecology;  Laterality: N/A;   HYSTEROSCOPY WITH D & C N/A 07/15/2020   Procedure: DILATATION AND CURETTAGE /HYSTEROSCOPY;  Surgeon: Lenord Radon, MD;  Location: [facility] OR;  Service: Gynecology;  Laterality: N/A;   KNEE ARTHROSCOPY  04/2007   left knee   KNEE CLOSED REDUCTION Right 10/27/2023   Procedure: MANIPULATION, KNEE, CLOSED;  Surgeon: Arnie Lao, MD;  Location: [facility];  Service: Orthopedics;  Laterality: Right;   LAPAROSCOPIC GASTRIC SLEEVE RESECTION     TOTAL KNEE ARTHROPLASTY Left 10/2008   Dr Franz Jacks   TOTAL KNEE ARTHROPLASTY Right 08/26/2023   Procedure: ARTHROPLASTY, KNEE, TOTAL;  Surgeon: Arnie Lao, MD;  Location: [facility];  Service: Orthopedics;  Laterality: Right;   UPPER GI ENDOSCOPY N/A 10/14/2020   Procedure: UPPER GI ENDOSCOPY;  Surgeon: Aldean Hummingbird, MD;  Location: [facility];  Service: General;  Laterality: N/A;   Patient Active Problem List   Diagnosis Date Noted   Arthrofibrosis of knee joint, right 10/12/2023   Status post total right knee replacement  08/26/2023   Sleep disturbance 01/21/2023   Lactose intolerance 10/22/2022   S/P gastric sleeve procedure 11/04/2020   Uncomplicated asthma 11/04/2020   Gastroesophageal reflux disease 11/04/2020   Primary osteoarthritis of both hips 09/30/2020   History of total left knee replacement 07/29/2020   Endometrial polyp 07/15/2020   Low grade squamous intraepithelial lesion (LGSIL) on cervical Pap smear 07/07/2020   Upper abdominal pain 08/20/2019   Right hip pain 05/28/2019   Post-menopausal bleeding 05/16/2019   Greater trochanteric pain syndrome of left lower extremity 03/20/2019   Type 2  diabetes mellitus without complication, without long-term current use of insulin  (HCC) 05/18/2018   Vitamin D  deficiency 02/09/2018   Onychomycosis 09/19/2017   Tinea pedis of both feet 09/19/2017   Obstructive chronic bronchitis with exacerbation (HCC) 05/23/2017   Mixed hyperlipidemia 05/01/2014   Genital herpes 05/23/2012   Stress incontinence 03/04/2012   Unilateral primary osteoarthritis, right knee 10/12/2011   CTS (carpal tunnel syndrome) 07/13/2011   OSA (obstructive sleep apnea) 12/10/2010   Severe obesity (BMI >= 40) (HCC) 07/31/2008   Essential hypertension 07/03/2008    PCP: Dorrene Gaucher, NP  REFERRING PROVIDER: Arnie Lao, MD  REFERRING DIAG: M17.11 (ICD-10-CM) - Unilateral primary osteoarthritis, right knee  Rationale for Evaluation and Treatment: Rehabilitation  THERAPY DIAG:  Acute pain of right knee  Stiffness of right knee, not elsewhere classified  Localized edema  Other abnormalities of gait and mobility  Unsteadiness on feet  ONSET DATE: 08/26/23 (DOS)   SUBJECTIVE:                                                                                                                                                                                           SUBJECTIVE STATEMENT: Pt indicated a little pain upon arrival today.  Pt indicated lying in bed can still be troublesome when trying to lie down.   PERTINENT HISTORY:  besity, HTN, HLD, DM, OA, Lt TKA in 2010, s/p gastric sleeve resection 2022  PAIN:  NPRS scale: a little Pain location: Rt knee Pain description: sore  Aggravating factors: Knee flexion Relieving factors: Prescription pain medication, ice  PRECAUTIONS:  None  RED FLAGS: None   WEIGHT BEARING RESTRICTIONS:  No  FALLS:  Has patient fallen in last 6 months? No  LIVING ENVIRONMENT: Lives with: lives alone Lives in: House/apartment Stairs: Yes: External: 16 steps; can reach both Has following equipment  at home: Walker - 4 wheeled  OCCUPATION:  On disability  PLOF:  Independent and Leisure: watching TV, shopping, has a fitness center at her apartment complex  PATIENT GOALS:  Improve function in knee, get back to  gym  OBJECTIVE:  Note: Objective measures were completed at Evaluation unless otherwise noted.  PATIENT SURVEYS:  Patient-Specific Activity Scoring Scheme  0 represents "unable to perform." 10 represents "able to perform at prior level. 0 1 2 3 4 5 6 7 8 9  10 (Date and Score)   Activity Eval  10/11/23   1. Walking 2 5   2. Bending the knee 2  5  3. Extending the knee 2 8  Score 2 6   Total score = sum of the activity scores/number of activities Minimum detectable change (90%CI) for average score = 2 points Minimum detectable change (90%CI) for single activity score = 3 points    COGNITIVE STATUS: Within functional limits for tasks assessed   SENSATION: WFL  GAIT: 09/08/23 Distance walked: 150' within clinic Assistive device utilized: Environmental consultant - 4 wheeled Level of assistance: Modified independence Comments: decreased stance on Rt, decr hip/knee flexion on Rt   EDEMA: 09/08/23  Knee joint line: Rt: 71 cm/Lt: 48.5 cm 09/19/23: knee joint line: Rt: 59.5 centimeters   LOWER EXTREMITY ROM:     ROM Right eval Left eval Rt 09/13/23 Rt 09/19/23 Right 09/21/23 Right 09/30/23 Rt 10/03/23 Rt 10/11/23 Rt 10/17/23 Right 10/28/2023 Right 10/31/2023  Knee flexion A: 21 P: 41 A: 108 A: 48 P: 60 A: 50 P: 60 P: 71* P 56* AA: 50 P: 58 AA:58 P: 60 AA: 60 AAROM 92 Supine AROM heel slide:  82  Supine AAROM heel slide: 92     Knee extension A: 8 (seated LAQ) P: 0 A: 0 A: 10 P: 8 A: 10 P: 6   A: -7 P: -5 A: -5 P: -2 A: -5 -5    (Blank rows = not tested)   LOWER EXTREMITY MMT:   09/08/2023 Not formally tested at eval; Rt knee grossly 3-/5  MMT Right 09/28/2023 Rt 10/03/23 Seated HHD 10/17/23 Rt Seated  HHD  Hip flexion     Hip extension     Hip  abduction     Hip adduction     Hip internal rotation     Hip external rotation     Knee flexion  18.7 22.4 19.2 25.4  Knee extension 4/5 20.5 21.3 30.6 33.1  Ankle dorsiflexion     Ankle plantarflexion     Ankle inversion     Ankle eversion      (Blank rows = not tested)                   Today's Treatment       DATE: 11/01/2023 Therex: UBE UE/LE for knee rom.  Started in seat 10 for 3 mins then seat 9 7 mins for 10 mins total.  Full revolutions forward noted after 1-2 mins to begin.  Rt knee on step with lunge overpressure stretch 15 sec x 5 with foot on 8 inch step, bilateral hands on bars for support.   TherActivity Leg press in available knee flexion range double leg 75 lbs x 20, single leg Rt 37 lbs 2 x 15  Step up 6 inch WB on Rt leg with focus on knee flexion to place foot , single hand on rail Rt arm, x 10 with SBA   Manual: Seated Rt knee flexion c distraction c movement /IR for mobility gains.  Cross friction massage performed for tissue mobility.  Cues for home use given.   Vaso 10 mins Rt knee in elevation medium compression 34 deg.   Today's Treatment  DATE: 10/31/2023 Therex: Nustep with focus on knee flexion mobility to tolerance 10 mins UE/LE  Seated Rt leg LAQ with end range pauses 2 x 15 Seated Rt knee flexion c Lt leg overpressure 15 sec hold x 10  Supine heel prop education for extension, performed 1 min Incline gastroc stretch 30 sec x 3 bilateral   TherActivity Sit to stand to sit 23 inch table height no UE assist x15   Manual: Seated Rt knee flexion c distraction c movement /IR for mobility gains.  Cross friction massage performed for tissue mobility.  Cues for home use given.   Vaso 10 mins Rt knee in elevation medium compression 34 deg.    Today's Treatment       DATE: 10/28/2023 Recumbent bike Seat 5 AAROM to full range 10 x backwards and 20 x forwards (10 minutes total) Seated knee flexion AAROM with PT overpressure 2 rounds of 10 x  10 seconds Supine knee flexion with strap and quad sets between reps 10 x 5 seconds each  Functional Activities: Double Leg Press 75# 10 x full range and stretch into flexion Single Leg Press 37# 10 x full range and stretch into flexion  Vaso Right Knee High 34* 10 minutes   Today's Treatment       DATE:10/17/23 Therex Nustep: level 5 x 8 minutes UE/LE Standing toe/heel raises x 15 c UE support Supine feet on pball knee extended bridge 2x10 Theract Step ups on 4 inch step x 15  Sit to stand: 2 x 10 from 18 inch heigh chair using no UE 's  Double leg press 81# 3 x 10 Single leg press within shortened range for terminal knee ext 62# 2x10    PATIENT EDUCATION:  Education details: HEP Person educated: Patient Education method: Programmer, multimedia, Facilities manager, and Handouts Education comprehension: verbalized understanding, returned demonstration, and needs further education  HOME EXERCISE PROGRAM: Access Code: [email or web address] URL: [email or web address] Date: 10/28/2023 Prepared by: Terral Ferrari  Exercises - Ankle Alphabet in Elevation  - 10 x daily - 7 x weekly - 1 sets - 10 reps - Quad Set  - 5-10 x daily - 7 x weekly - 1 sets - 5-10 reps - 5 sec hold - Seated Knee Flexion Extension AROM   - 5-10 x daily - 7 x weekly - 1 sets - 5-10 reps - 5 sec  hold - Supine Bridge  - 2 x daily - 7 x weekly - 2 sets - 10 reps - 3 seconds hold - Sit to Stand with Counter Support  - 2 x daily - 7 x weekly - 2 sets - 10 reps - Seated Heel Slide  - 2 x daily - 7 x weekly - 2 sets - 10 reps - Seated Knee Flexion AAROM  - 10 x daily - 7 x weekly - 1 sets - 10 reps - 10 second hold   ASSESSMENT:  CLINICAL IMPRESSION: Pt to benefit from continued efforts in manual and intervention to promote knee flexion mobility while continuing strengthening WB activity.  Continued review of use of HEP at home to promote mobility throughout the day.    OBJECTIVE IMPAIRMENTS: Abnormal gait, decreased  balance, decreased endurance, decreased knowledge of use of DME, decreased mobility, difficulty walking, decreased ROM, decreased strength, hypomobility, increased edema, increased fascial restrictions, increased muscle spasms, and pain.    GOALS: Goals reviewed with patient? Yes  SHORT TERM GOALS: Target date: 10/06/2023  Independent with initial HEP Goal status: MET 10/03/23  2.  Rt  knee ROM improved to 0-90 for improved mobility Goal status: Partially met 10/28/2023   LONG TERM GOALS: Target date: 11/25/2023  Independent with final HEP Goal status: Ongoing  10/28/2023  2.  PSFS score improved by 3 points Goal status: MET 10/11/23  3.  Rt knee AROM improved to 0-100 for improved function and mobility Goal status:  Ongoing  10/28/2023  4.  Report pain < 4/10 with standing and walking for improved function Goal status: Ongoing  10/28/2023  5.  Amb without AD without significant deviations for improved community access Goal status: Ongoing  10/28/2023    PLAN:  PT FREQUENCY: 3-5x/wk  PT DURATION: 4 additional weeks  PLANNED INTERVENTIONS: 97164- PT Re-evaluation, 97750- Physical Performance Testing, 97110-Therapeutic exercises, 97530- Therapeutic activity, 97112- Neuromuscular re-education, 97535- Self Care, 16109- Manual therapy, [phone]- Gait training, [phone]- Aquatic Therapy, [phone]- Electrical stimulation (unattended), 97016- Vasopneumatic device, Patient/Family education, Balance training, Stair training, Taping, Dry Needling, Joint mobilization, DME instructions, Cryotherapy, and Moist heat.  PLAN FOR NEXT SESSION: WB strengthening, flexion gains.    Bonna Bustard, PT, DPT, OCS, ATC 11/01/23  11:37 AM

## 2023-11-02 ENCOUNTER — Ambulatory Visit (INDEPENDENT_AMBULATORY_CARE_PROVIDER_SITE_OTHER): Admitting: Rehabilitative and Restorative Service Providers"

## 2023-11-02 ENCOUNTER — Encounter: Payer: Self-pay | Admitting: Rehabilitative and Restorative Service Providers"

## 2023-11-02 DIAGNOSIS — R600 Localized edema: Secondary | ICD-10-CM | POA: Diagnosis not present

## 2023-11-02 DIAGNOSIS — R2689 Other abnormalities of gait and mobility: Secondary | ICD-10-CM | POA: Diagnosis not present

## 2023-11-02 DIAGNOSIS — M25661 Stiffness of right knee, not elsewhere classified: Secondary | ICD-10-CM

## 2023-11-02 DIAGNOSIS — M25561 Pain in right knee: Secondary | ICD-10-CM | POA: Diagnosis not present

## 2023-11-02 DIAGNOSIS — R2681 Unsteadiness on feet: Secondary | ICD-10-CM

## 2023-11-02 NOTE — Therapy (Signed)
 OUTPATIENT PHYSICAL THERAPY TREATMENT   Patient Name: RUFINA KIMERY MRN: [id number] DOB:06/27/1955, 68 y.o., female Today's Date: 11/02/2023   END OF SESSION:  PT End of Session - 11/02/23 1444     Visit Number 19    Number of Visits 24    Date for PT Re-Evaluation 11/25/23    Authorization Type [employer] Medicare Dual Complete    Authorization Time Period No authorization needed    Progress Note Due on Visit 24    PT Start Time 1441    PT Stop Time 1518    PT Time Calculation (min) 37 min    Activity Tolerance Patient tolerated treatment well    Behavior During Therapy [facility] for tasks assessed/performed                      Past Medical History:  Diagnosis Date   Bronchitis    Chronic bronchitis (HCC)    Cystitis    Hematuria    urologice eval, Dr Clifton Dames   History of chest pain    Hyperbilirubinemia    Hyperlipemia    Hypertelorism    Hypertension    Leukocytosis    Obesity    Osteoarthritis    right knee   Pre-diabetes    Sleep apnea 2012   slight   no CPAP   Vaginal Pap smear, abnormal    Vitamin D  deficiency 02/09/2018   Past Surgical History:  Procedure Laterality Date   CERVICAL POLYPECTOMY  07/15/2020   Procedure: POLYPECTOMY with Myosure;  Surgeon: Lenord Radon, MD;  Location: [facility] OR;  Service: Gynecology;;   COLONOSCOPY WITH PROPOFOL  N/A 03/27/2015   Procedure: COLONOSCOPY WITH PROPOFOL ;  Surgeon: Janel Medford, MD;  Location: [facility];  Service: Endoscopy;  Laterality: N/A;   COLONOSCOPY WITH PROPOFOL  N/A 10/02/2020   Procedure: COLONOSCOPY WITH PROPOFOL ;  Surgeon: Janel Medford, MD;  Location: [facility];  Service: Endoscopy;  Laterality: N/A;   COLPOSCOPY N/A 07/15/2020   Procedure: COLPOSCOPY;  Surgeon: Lenord Radon, MD;  Location: [facility] OR;  Service: Gynecology;  Laterality: N/A;   DILATATION & CURETTAGE/HYSTEROSCOPY WITH MYOSURE N/A 01/05/2017   Procedure: DILATATION & CURETTAGE/HYSTEROSCOPY WITH MYOSURE;   Surgeon: Johnn Najjar, MD;  Location: [facility];  Service: Gynecology;  Laterality: N/A;  PostMenopausal Bleeding   HYSTEROSCOPY WITH D & C N/A 05/22/2019   Procedure: DILATATION AND CURETTAGE /HYSTEROSCOPY, POLYPECTOMY WITH MYOSURE;  Surgeon: Lenord Radon, MD;  Location: [facility];  Service: Gynecology;  Laterality: N/A;   HYSTEROSCOPY WITH D & C N/A 07/15/2020   Procedure: DILATATION AND CURETTAGE /HYSTEROSCOPY;  Surgeon: Lenord Radon, MD;  Location: [facility] OR;  Service: Gynecology;  Laterality: N/A;   KNEE ARTHROSCOPY  04/2007   left knee   KNEE CLOSED REDUCTION Right 10/27/2023   Procedure: MANIPULATION, KNEE, CLOSED;  Surgeon: Arnie Lao, MD;  Location: [facility];  Service: Orthopedics;  Laterality: Right;   LAPAROSCOPIC GASTRIC SLEEVE RESECTION     TOTAL KNEE ARTHROPLASTY Left 10/2008   Dr Franz Jacks   TOTAL KNEE ARTHROPLASTY Right 08/26/2023   Procedure: ARTHROPLASTY, KNEE, TOTAL;  Surgeon: Arnie Lao, MD;  Location: [facility];  Service: Orthopedics;  Laterality: Right;   UPPER GI ENDOSCOPY N/A 10/14/2020   Procedure: UPPER GI ENDOSCOPY;  Surgeon: Aldean Hummingbird, MD;  Location: [facility];  Service: General;  Laterality: N/A;   Patient Active Problem List   Diagnosis Date Noted   Arthrofibrosis of knee joint, right 10/12/2023   Status post total right knee  replacement 08/26/2023   Sleep disturbance 01/21/2023   Lactose intolerance 10/22/2022   S/P gastric sleeve procedure 11/04/2020   Uncomplicated asthma 11/04/2020   Gastroesophageal reflux disease 11/04/2020   Primary osteoarthritis of both hips 09/30/2020   History of total left knee replacement 07/29/2020   Endometrial polyp 07/15/2020   Low grade squamous intraepithelial lesion (LGSIL) on cervical Pap smear 07/07/2020   Upper abdominal pain 08/20/2019   Right hip pain 05/28/2019   Post-menopausal bleeding 05/16/2019   Greater trochanteric pain syndrome of left lower extremity 03/20/2019   Type 2  diabetes mellitus without complication, without long-term current use of insulin  (HCC) 05/18/2018   Vitamin D  deficiency 02/09/2018   Onychomycosis 09/19/2017   Tinea pedis of both feet 09/19/2017   Obstructive chronic bronchitis with exacerbation (HCC) 05/23/2017   Mixed hyperlipidemia 05/01/2014   Genital herpes 05/23/2012   Stress incontinence 03/04/2012   Unilateral primary osteoarthritis, right knee 10/12/2011   CTS (carpal tunnel syndrome) 07/13/2011   OSA (obstructive sleep apnea) 12/10/2010   Severe obesity (BMI >= 40) (HCC) 07/31/2008   Essential hypertension 07/03/2008    PCP: Dorrene Gaucher, NP  REFERRING PROVIDER: Arnie Lao, MD  REFERRING DIAG: M17.11 (ICD-10-CM) - Unilateral primary osteoarthritis, right knee  Rationale for Evaluation and Treatment: Rehabilitation  THERAPY DIAG:  Acute pain of right knee  Stiffness of right knee, not elsewhere classified  Localized edema  Other abnormalities of gait and mobility  Unsteadiness on feet  ONSET DATE: 08/26/23 (DOS)   SUBJECTIVE:                                                                                                                                                                                           SUBJECTIVE STATEMENT: Pt indicated doing ok since yesterday.  Reported late pickup today by transportation.   PERTINENT HISTORY:  besity, HTN, HLD, DM, OA, Lt TKA in 2010, s/p gastric sleeve resection 2022  PAIN:  NPRS scale: no specific pain upon arrival, tight Pain location: Rt knee Pain description: sore  Aggravating factors: Knee flexion Relieving factors: Prescription pain medication, ice  PRECAUTIONS:  None  RED FLAGS: None   WEIGHT BEARING RESTRICTIONS:  No  FALLS:  Has patient fallen in last 6 months? No  LIVING ENVIRONMENT: Lives with: lives alone Lives in: House/apartment Stairs: Yes: External: 16 steps; can reach both Has following equipment at home:  Walker - 4 wheeled  OCCUPATION:  On disability  PLOF:  Independent and Leisure: watching TV, shopping, has a fitness center at her apartment complex  PATIENT GOALS:  Improve function in knee, get back to gym  OBJECTIVE:  Note:  Objective measures were completed at Evaluation unless otherwise noted.  PATIENT SURVEYS:  Patient-Specific Activity Scoring Scheme  0 represents "unable to perform." 10 represents "able to perform at prior level. 0 1 2 3 4 5 6 7 8 9  10 (Date and Score)   Activity Eval  10/11/23   1. Walking 2 5   2. Bending the knee 2  5  3. Extending the knee 2 8  Score 2 6   Total score = sum of the activity scores/number of activities Minimum detectable change (90%CI) for average score = 2 points Minimum detectable change (90%CI) for single activity score = 3 points    COGNITIVE STATUS: Within functional limits for tasks assessed   SENSATION: WFL  GAIT: 09/08/23 Distance walked: 150' within clinic Assistive device utilized: Environmental consultant - 4 wheeled Level of assistance: Modified independence Comments: decreased stance on Rt, decr hip/knee flexion on Rt   EDEMA: 09/08/23  Knee joint line: Rt: 71 cm/Lt: 48.5 cm 09/19/23: knee joint line: Rt: 59.5 centimeters   LOWER EXTREMITY ROM:     ROM Right eval Left eval Rt 09/13/23 Rt 09/19/23 Right 09/21/23 Right 09/30/23 Rt 10/03/23 Rt 10/11/23 Rt 10/17/23 Right 10/28/2023 Right 10/31/2023  Knee flexion A: 21 P: 41 A: 108 A: 48 P: 60 A: 50 P: 60 P: 71* P 56* AA: 50 P: 58 AA:58 P: 60 AA: 60 AAROM 92 Supine AROM heel slide:  82  Supine AAROM heel slide: 92     Knee extension A: 8 (seated LAQ) P: 0 A: 0 A: 10 P: 8 A: 10 P: 6   A: -7 P: -5 A: -5 P: -2 A: -5 -5    (Blank rows = not tested)   LOWER EXTREMITY MMT:   09/08/2023 Not formally tested at eval; Rt knee grossly 3-/5  MMT Right 09/28/2023 Rt 10/03/23 Seated HHD 10/17/23 Rt Seated  HHD  Hip flexion     Hip extension     Hip abduction      Hip adduction     Hip internal rotation     Hip external rotation     Knee flexion  18.7 22.4 19.2 25.4  Knee extension 4/5 20.5 21.3 30.6 33.1  Ankle dorsiflexion     Ankle plantarflexion     Ankle inversion     Ankle eversion      (Blank rows = not tested)                   Today's Treatment       DATE: 11/02/2023 Therex: Nustep Lvl 6 10 mins UE/LE Seated LAQ Rt leg with contralateral leg movement opposite with pause in end ranges flexion/extension 4 lbs 2 x 15 Seated Rt knee flexion with Lt leg overpressure 15 sec x 5   Neuro Re-ed (balance control, muscle activation) SLS with 6 inch hurdle step over and back 2 x 10 bilateral with light hand assist on bar - focus on knee flexion over bar   Vaso 10 mins Rt knee in elevation medium compression 34 deg.   Today's Treatment       DATE: 11/01/2023 Therex: UBE UE/LE for knee rom.  Started in seat 10 for 3 mins then seat 9 7 mins for 10 mins total.  Full revolutions forward noted after 1-2 mins to begin.  Rt knee on step with lunge overpressure stretch 15 sec x 5 with foot on 8 inch step, bilateral hands on bars for support.   TherActivity Leg press in  available knee flexion range double leg 75 lbs x 20, single leg Rt 37 lbs 2 x 15  Step up 6 inch WB on Rt leg with focus on knee flexion to place foot , single hand on rail Rt arm, x 10 with SBA   Manual: Seated Rt knee flexion c distraction c movement /IR for mobility gains.  Cross friction massage performed for tissue mobility.  Cues for home use given.   Vaso 10 mins Rt knee in elevation medium compression 34 deg.   Today's Treatment       DATE: 10/31/2023 Therex: Nustep with focus on knee flexion mobility to tolerance 10 mins UE/LE  Seated Rt leg LAQ with end range pauses 2 x 15 Seated Rt knee flexion c Lt leg overpressure 15 sec hold x 10  Supine heel prop education for extension, performed 1 min Incline gastroc stretch 30 sec x 3 bilateral   TherActivity Sit to  stand to sit 23 inch table height no UE assist x15   Manual: Seated Rt knee flexion c distraction c movement /IR for mobility gains.  Cross friction massage performed for tissue mobility.  Cues for home use given.   Vaso 10 mins Rt knee in elevation medium compression 34 deg.    Today's Treatment       DATE: 10/28/2023 Recumbent bike Seat 5 AAROM to full range 10 x backwards and 20 x forwards (10 minutes total) Seated knee flexion AAROM with PT overpressure 2 rounds of 10 x 10 seconds Supine knee flexion with strap and quad sets between reps 10 x 5 seconds each  Functional Activities: Double Leg Press 75# 10 x full range and stretch into flexion Single Leg Press 37# 10 x full range and stretch into flexion  Vaso Right Knee High 34* 10 minutes   PATIENT EDUCATION:  Education details: HEP Person educated: Patient Education method: Programmer, multimedia, Demonstration, and Handouts Education comprehension: verbalized understanding, returned demonstration, and needs further education  HOME EXERCISE PROGRAM: Access Code: [email or web address] URL: [email or web address] Date: 10/28/2023 Prepared by: Terral Ferrari  Exercises - Ankle Alphabet in Elevation  - 10 x daily - 7 x weekly - 1 sets - 10 reps - Quad Set  - 5-10 x daily - 7 x weekly - 1 sets - 5-10 reps - 5 sec hold - Seated Knee Flexion Extension AROM   - 5-10 x daily - 7 x weekly - 1 sets - 5-10 reps - 5 sec  hold - Supine Bridge  - 2 x daily - 7 x weekly - 2 sets - 10 reps - 3 seconds hold - Sit to Stand with Counter Support  - 2 x daily - 7 x weekly - 2 sets - 10 reps - Seated Heel Slide  - 2 x daily - 7 x weekly - 2 sets - 10 reps - Seated Knee Flexion AAROM  - 10 x daily - 7 x weekly - 1 sets - 10 reps - 10 second hold   ASSESSMENT:  CLINICAL IMPRESSION: Provided variable flexion based activity in clinic today in available time.  Pt to benefit from continued gains in flexion to help provide improved clearance in ambulation.    Decreased treatment time available today due to late arrival due to transportation.   OBJECTIVE IMPAIRMENTS: Abnormal gait, decreased balance, decreased endurance, decreased knowledge of use of DME, decreased mobility, difficulty walking, decreased ROM, decreased strength, hypomobility, increased edema, increased fascial restrictions, increased muscle spasms, and pain.    GOALS: Goals reviewed with  patient? Yes  SHORT TERM GOALS: Target date: 10/06/2023  Independent with initial HEP Goal status: MET 10/03/23  2.  Rt knee ROM improved to 0-90 for improved mobility Goal status: Partially met 10/28/2023   LONG TERM GOALS: Target date: 11/25/2023  Independent with final HEP Goal status: Ongoing  10/28/2023  2.  PSFS score improved by 3 points Goal status: MET 10/11/23  3.  Rt knee AROM improved to 0-100 for improved function and mobility Goal status:  Ongoing  10/28/2023  4.  Report pain < 4/10 with standing and walking for improved function Goal status: Ongoing  10/28/2023  5.  Amb without AD without significant deviations for improved community access Goal status: Ongoing  10/28/2023    PLAN:  PT FREQUENCY: 3-5x/wk  PT DURATION: 4 additional weeks  PLANNED INTERVENTIONS: 97164- PT Re-evaluation, 97750- Physical Performance Testing, 97110-Therapeutic exercises, 97530- Therapeutic activity, 97112- Neuromuscular re-education, 97535- Self Care, 19147- Manual therapy, [phone]- Gait training, [phone]- Aquatic Therapy, [phone]- Electrical stimulation (unattended), 97016- Vasopneumatic device, Patient/Family education, Balance training, Stair training, Taping, Dry Needling, Joint mobilization, DME instructions, Cryotherapy, and Moist heat.  PLAN FOR NEXT SESSION: Continued focus on functional strengthening and knee flexion mobility gains.    Bonna Bustard, PT, DPT, OCS, ATC 11/02/23  3:11 PM

## 2023-11-03 ENCOUNTER — Ambulatory Visit (INDEPENDENT_AMBULATORY_CARE_PROVIDER_SITE_OTHER): Admitting: Physical Therapy

## 2023-11-03 ENCOUNTER — Encounter: Payer: Self-pay | Admitting: Physical Therapy

## 2023-11-03 DIAGNOSIS — M25661 Stiffness of right knee, not elsewhere classified: Secondary | ICD-10-CM

## 2023-11-03 DIAGNOSIS — M25561 Pain in right knee: Secondary | ICD-10-CM | POA: Diagnosis not present

## 2023-11-03 DIAGNOSIS — R600 Localized edema: Secondary | ICD-10-CM

## 2023-11-03 DIAGNOSIS — R2681 Unsteadiness on feet: Secondary | ICD-10-CM | POA: Diagnosis not present

## 2023-11-03 DIAGNOSIS — R2689 Other abnormalities of gait and mobility: Secondary | ICD-10-CM | POA: Diagnosis not present

## 2023-11-03 NOTE — Therapy (Signed)
 OUTPATIENT PHYSICAL THERAPY TREATMENT   Patient Name: Leah Hampton MRN: [id number] DOB:Jan 01, 1956, 68 y.o., female Today's Date: 11/03/2023   END OF SESSION:  PT End of Session - 11/03/23 1139     Visit Number 20    Number of Visits 24    Date for PT Re-Evaluation 11/25/23    Authorization Type [employer] Medicare Dual Complete    Authorization Time Period No authorization needed    Progress Note Due on Visit 24    PT Start Time 1145    PT Stop Time 1225    PT Time Calculation (min) 40 min    Activity Tolerance Patient tolerated treatment well    Behavior During Therapy [facility] for tasks assessed/performed          Past Medical History:  Diagnosis Date   Bronchitis    Chronic bronchitis (HCC)    Cystitis    Hematuria    urologice eval, Dr Clifton Dames   History of chest pain    Hyperbilirubinemia    Hyperlipemia    Hypertelorism    Hypertension    Leukocytosis    Obesity    Osteoarthritis    right knee   Pre-diabetes    Sleep apnea 2012   slight   no CPAP   Vaginal Pap smear, abnormal    Vitamin D  deficiency 02/09/2018   Past Surgical History:  Procedure Laterality Date   CERVICAL POLYPECTOMY  07/15/2020   Procedure: POLYPECTOMY with Myosure;  Surgeon: Lenord Radon, MD;  Location: [facility] OR;  Service: Gynecology;;   COLONOSCOPY WITH PROPOFOL  N/A 03/27/2015   Procedure: COLONOSCOPY WITH PROPOFOL ;  Surgeon: Janel Medford, MD;  Location: [facility];  Service: Endoscopy;  Laterality: N/A;   COLONOSCOPY WITH PROPOFOL  N/A 10/02/2020   Procedure: COLONOSCOPY WITH PROPOFOL ;  Surgeon: Janel Medford, MD;  Location: [facility];  Service: Endoscopy;  Laterality: N/A;   COLPOSCOPY N/A 07/15/2020   Procedure: COLPOSCOPY;  Surgeon: Lenord Radon, MD;  Location: [facility] OR;  Service: Gynecology;  Laterality: N/A;   DILATATION & CURETTAGE/HYSTEROSCOPY WITH MYOSURE N/A 01/05/2017   Procedure: DILATATION & CURETTAGE/HYSTEROSCOPY WITH MYOSURE;  Surgeon: Johnn Najjar,  MD;  Location: [facility] ORS;  Service: Gynecology;  Laterality: N/A;  PostMenopausal Bleeding   HYSTEROSCOPY WITH D & C N/A 05/22/2019   Procedure: DILATATION AND CURETTAGE /HYSTEROSCOPY, POLYPECTOMY WITH MYOSURE;  Surgeon: Lenord Radon, MD;  Location: [facility];  Service: Gynecology;  Laterality: N/A;   HYSTEROSCOPY WITH D & C N/A 07/15/2020   Procedure: DILATATION AND CURETTAGE /HYSTEROSCOPY;  Surgeon: Lenord Radon, MD;  Location: [facility] OR;  Service: Gynecology;  Laterality: N/A;   KNEE ARTHROSCOPY  04/2007   left knee   KNEE CLOSED REDUCTION Right 10/27/2023   Procedure: MANIPULATION, KNEE, CLOSED;  Surgeon: Arnie Lao, MD;  Location: [facility];  Service: Orthopedics;  Laterality: Right;   LAPAROSCOPIC GASTRIC SLEEVE RESECTION     TOTAL KNEE ARTHROPLASTY Left 10/2008   Dr Franz Jacks   TOTAL KNEE ARTHROPLASTY Right 08/26/2023   Procedure: ARTHROPLASTY, KNEE, TOTAL;  Surgeon: Arnie Lao, MD;  Location: [facility];  Service: Orthopedics;  Laterality: Right;   UPPER GI ENDOSCOPY N/A 10/14/2020   Procedure: UPPER GI ENDOSCOPY;  Surgeon: Aldean Hummingbird, MD;  Location: [facility];  Service: General;  Laterality: N/A;   Patient Active Problem List   Diagnosis Date Noted   Arthrofibrosis of knee joint, right 10/12/2023   Status post total right knee replacement 08/26/2023   Sleep disturbance 01/21/2023   Lactose intolerance 10/22/2022  S/P gastric sleeve procedure 11/04/2020   Uncomplicated asthma 11/04/2020   Gastroesophageal reflux disease 11/04/2020   Primary osteoarthritis of both hips 09/30/2020   History of total left knee replacement 07/29/2020   Endometrial polyp 07/15/2020   Low grade squamous intraepithelial lesion (LGSIL) on cervical Pap smear 07/07/2020   Upper abdominal pain 08/20/2019   Right hip pain 05/28/2019   Post-menopausal bleeding 05/16/2019   Greater trochanteric pain syndrome of left lower extremity 03/20/2019   Type 2 diabetes mellitus without  complication, without long-term current use of insulin  (HCC) 05/18/2018   Vitamin D  deficiency 02/09/2018   Onychomycosis 09/19/2017   Tinea pedis of both feet 09/19/2017   Obstructive chronic bronchitis with exacerbation (HCC) 05/23/2017   Mixed hyperlipidemia 05/01/2014   Genital herpes 05/23/2012   Stress incontinence 03/04/2012   Unilateral primary osteoarthritis, right knee 10/12/2011   CTS (carpal tunnel syndrome) 07/13/2011   OSA (obstructive sleep apnea) 12/10/2010   Severe obesity (BMI >= 40) (HCC) 07/31/2008   Essential hypertension 07/03/2008    PCP: Dorrene Gaucher, NP  REFERRING PROVIDER: Arnie Lao, MD  REFERRING DIAG: M17.11 (ICD-10-CM) - Unilateral primary osteoarthritis, right knee  Rationale for Evaluation and Treatment: Rehabilitation  THERAPY DIAG:  Acute pain of right knee  Stiffness of right knee, not elsewhere classified  Localized edema  Other abnormalities of gait and mobility  Unsteadiness on feet  ONSET DATE: 08/26/23 (DOS)   SUBJECTIVE:                                                                                                                                                                                           SUBJECTIVE STATEMENT: Pt states pain has been easing up some. Pt reports she got the manipulation and has been feeling less tightness.   PERTINENT HISTORY:  besity, HTN, HLD, DM, OA, Lt TKA in 2010, s/p gastric sleeve resection 2022  PAIN:  NPRS scale: no specific pain upon arrival, tight Pain location: Rt knee Pain description: sore  Aggravating factors: Knee flexion Relieving factors: Prescription pain medication, ice  PRECAUTIONS:  None  RED FLAGS: None   WEIGHT BEARING RESTRICTIONS:  No  FALLS:  Has patient fallen in last 6 months? No  LIVING ENVIRONMENT: Lives with: lives alone Lives in: House/apartment Stairs: Yes: External: 16 steps; can reach both Has following equipment at home:  Walker - 4 wheeled  OCCUPATION:  On disability  PLOF:  Independent and Leisure: watching TV, shopping, has a fitness center at her apartment complex  PATIENT GOALS:  Improve function in knee, get back to gym  OBJECTIVE:  Note: Objective measures were completed at Evaluation unless  otherwise noted.  PATIENT SURVEYS:  Patient-Specific Activity Scoring Scheme  0 represents "unable to perform." 10 represents "able to perform at prior level. 0 1 2 3 4 5 6 7 8 9  10 (Date and Score)   Activity Eval  10/11/23   1. Walking 2 5   2. Bending the knee 2  5  3. Extending the knee 2 8  Score 2 6   Total score = sum of the activity scores/number of activities Minimum detectable change (90%CI) for average score = 2 points Minimum detectable change (90%CI) for single activity score = 3 points    COGNITIVE STATUS: Within functional limits for tasks assessed   SENSATION: WFL  GAIT: 09/08/23 Distance walked: 150' within clinic Assistive device utilized: Environmental consultant - 4 wheeled Level of assistance: Modified independence Comments: decreased stance on Rt, decr hip/knee flexion on Rt   EDEMA: 09/08/23  Knee joint line: Rt: 71 cm/Lt: 48.5 cm 09/19/23: knee joint line: Rt: 59.5 centimeters   LOWER EXTREMITY ROM:     ROM Right eval Left eval Rt 09/13/23 Rt 09/19/23 Right 09/21/23 Right 09/30/23 Rt 10/03/23 Rt 10/11/23 Rt 10/17/23 Right 10/28/2023 Right 10/31/2023  Knee flexion A: 21 P: 41 A: 108 A: 48 P: 60 A: 50 P: 60 P: 71* P 56* AA: 50 P: 58 AA:58 P: 60 AA: 60 AAROM 92 Supine AROM heel slide:  82  Supine AAROM heel slide: 92     Knee extension A: 8 (seated LAQ) P: 0 A: 0 A: 10 P: 8 A: 10 P: 6   A: -7 P: -5 A: -5 P: -2 A: -5 -5    (Blank rows = not tested)   LOWER EXTREMITY MMT:   09/08/2023 Not formally tested at eval; Rt knee grossly 3-/5  MMT Right 09/28/2023 Rt 10/03/23 Seated HHD 10/17/23 Rt Seated  HHD  Hip flexion     Hip extension     Hip abduction      Hip adduction     Hip internal rotation     Hip external rotation     Knee flexion  18.7 22.4 19.2 25.4  Knee extension 4/5 20.5 21.3 30.6 33.1  Ankle dorsiflexion     Ankle plantarflexion     Ankle inversion     Ankle eversion      (Blank rows = not tested)                  Today's Treatment       DATE: 11/03/2023 Therex: Nustep Lvl 6 10 mins UE/LE incrementally pushing seat forward Standing hamstring stretch with foot on 6 step x30  Therapeutic Activity: R foot on 6 step knee flexion stretch x20 R only Step tap up on 6 step 2x10 R&L Standing foot on 6 step quad set with knee extended 2x10 R only  Gait Training: Heel/toe pattern, increased knee flexion during R swing phase, cane in L hand  Vaso 10 mins Rt knee in elevation medium compression 34 deg.    Today's Treatment       DATE: 11/02/2023 Therex: Nustep Lvl 6 10 mins UE/LE Seated LAQ Rt leg with contralateral leg movement opposite with pause in end ranges flexion/extension 4 lbs 2 x 15 Seated Rt knee flexion with Lt leg overpressure 15 sec x 5   Neuro Re-ed (balance control, muscle activation) SLS with 6 inch hurdle step over and back 2 x 10 bilateral with light hand assist on bar - focus on knee flexion over bar  Vaso 10 mins Rt knee in elevation medium compression 34 deg.   Today's Treatment       DATE: 11/01/2023 Therex: UBE UE/LE for knee rom.  Started in seat 10 for 3 mins then seat 9 7 mins for 10 mins total.  Full revolutions forward noted after 1-2 mins to begin.  Rt knee on step with lunge overpressure stretch 15 sec x 5 with foot on 8 inch step, bilateral hands on bars for support.   TherActivity Leg press in available knee flexion range double leg 75 lbs x 20, single leg Rt 37 lbs 2 x 15  Step up 6 inch WB on Rt leg with focus on knee flexion to place foot , single hand on rail Rt arm, x 10 with SBA   Manual: Seated Rt knee flexion c distraction c movement /IR for mobility gains.  Cross  friction massage performed for tissue mobility.  Cues for home use given.   Vaso 10 mins Rt knee in elevation medium compression 34 deg.   Today's Treatment       DATE: 10/31/2023 Therex: Nustep with focus on knee flexion mobility to tolerance 10 mins UE/LE  Seated Rt leg LAQ with end range pauses 2 x 15 Seated Rt knee flexion c Lt leg overpressure 15 sec hold x 10  Supine heel prop education for extension, performed 1 min Incline gastroc stretch 30 sec x 3 bilateral   TherActivity Sit to stand to sit 23 inch table height no UE assist x15   Manual: Seated Rt knee flexion c distraction c movement /IR for mobility gains.  Cross friction massage performed for tissue mobility.  Cues for home use given.   Vaso 10 mins Rt knee in elevation medium compression 34 deg.    Today's Treatment       DATE: 10/28/2023 Recumbent bike Seat 5 AAROM to full range 10 x backwards and 20 x forwards (10 minutes total) Seated knee flexion AAROM with PT overpressure 2 rounds of 10 x 10 seconds Supine knee flexion with strap and quad sets between reps 10 x 5 seconds each  Functional Activities: Double Leg Press 75# 10 x full range and stretch into flexion Single Leg Press 37# 10 x full range and stretch into flexion  Vaso Right Knee High 34* 10 minutes   PATIENT EDUCATION:  Education details: HEP Person educated: Patient Education method: Programmer, multimedia, Demonstration, and Handouts Education comprehension: verbalized understanding, returned demonstration, and needs further education  HOME EXERCISE PROGRAM: Access Code: [email or web address] URL: [email or web address] Date: 10/28/2023 Prepared by: Terral Ferrari  Exercises - Ankle Alphabet in Elevation  - 10 x daily - 7 x weekly - 1 sets - 10 reps - Quad Set  - 5-10 x daily - 7 x weekly - 1 sets - 5-10 reps - 5 sec hold - Seated Knee Flexion Extension AROM   - 5-10 x daily - 7 x weekly - 1 sets - 5-10 reps - 5 sec  hold - Supine Bridge  - 2 x  daily - 7 x weekly - 2 sets - 10 reps - 3 seconds hold - Sit to Stand with Counter Support  - 2 x daily - 7 x weekly - 2 sets - 10 reps - Seated Heel Slide  - 2 x daily - 7 x weekly - 2 sets - 10 reps - Seated Knee Flexion AAROM  - 10 x daily - 7 x weekly - 1 sets - 10 reps - 10 second hold  ASSESSMENT:  CLINICAL IMPRESSION: Continued to work on improving knee ROM. Increasing tolerance to weight bearing and weightshifting on R LE. Focused on improving gait quality this session, encouraging pt to flex her knee during swing phase and switching cane to L hand to better support R LE.   OBJECTIVE IMPAIRMENTS: Abnormal gait, decreased balance, decreased endurance, decreased knowledge of use of DME, decreased mobility, difficulty walking, decreased ROM, decreased strength, hypomobility, increased edema, increased fascial restrictions, increased muscle spasms, and pain.    GOALS: Goals reviewed with patient? Yes  SHORT TERM GOALS: Target date: 10/06/2023  Independent with initial HEP Goal status: MET 10/03/23  2.  Rt knee ROM improved to 0-90 for improved mobility Goal status: Partially met 10/28/2023   LONG TERM GOALS: Target date: 11/25/2023  Independent with final HEP Goal status: Ongoing  10/28/2023  2.  PSFS score improved by 3 points Goal status: MET 10/11/23  3.  Rt knee AROM improved to 0-100 for improved function and mobility Goal status:  Ongoing  10/28/2023  4.  Report pain < 4/10 with standing and walking for improved function Goal status: Ongoing  10/28/2023  5.  Amb without AD without significant deviations for improved community access Goal status: Ongoing  10/28/2023    PLAN:  PT FREQUENCY: 3-5x/wk  PT DURATION: 4 additional weeks  PLANNED INTERVENTIONS: 97164- PT Re-evaluation, 97750- Physical Performance Testing, 97110-Therapeutic exercises, 97530- Therapeutic activity, 97112- Neuromuscular re-education, 97535- Self Care, 16109- Manual therapy, [phone]- Gait  training, [phone]- Aquatic Therapy, [phone]- Electrical stimulation (unattended), 97016- Vasopneumatic device, Patient/Family education, Balance training, Stair training, Taping, Dry Needling, Joint mobilization, DME instructions, Cryotherapy, and Moist heat.  PLAN FOR NEXT SESSION: Continued focus on functional strengthening and knee flexion mobility gains.    Nayzeth Altman April Ma L Raye Slyter, PT, DPT 11/03/23  11:39 AM

## 2023-11-04 ENCOUNTER — Encounter: Payer: Self-pay | Admitting: Physical Therapy

## 2023-11-04 ENCOUNTER — Ambulatory Visit: Admitting: Physical Therapy

## 2023-11-04 DIAGNOSIS — M25661 Stiffness of right knee, not elsewhere classified: Secondary | ICD-10-CM | POA: Diagnosis not present

## 2023-11-04 DIAGNOSIS — R600 Localized edema: Secondary | ICD-10-CM | POA: Diagnosis not present

## 2023-11-04 DIAGNOSIS — R2689 Other abnormalities of gait and mobility: Secondary | ICD-10-CM

## 2023-11-04 DIAGNOSIS — M25561 Pain in right knee: Secondary | ICD-10-CM

## 2023-11-04 NOTE — Therapy (Signed)
 OUTPATIENT PHYSICAL THERAPY TREATMENT   Patient Name: Leah Hampton MRN: [id number] DOB:08-10-1955, 68 y.o., female Today's Date: 11/04/2023   END OF SESSION:  PT End of Session - 11/04/23 1056     Visit Number 21    Number of Visits 24    Date for PT Re-Evaluation 11/25/23    Authorization Type [employer] Medicare Dual Complete    Authorization Time Period No authorization needed    Progress Note Due on Visit 24    PT Start Time 1035   late due to cab   PT Stop Time 1111   ride arrived and pt needed to leave   PT Time Calculation (min) 36 min    Activity Tolerance Patient tolerated treatment well    Behavior During Therapy [facility] for tasks assessed/performed           Past Medical History:  Diagnosis Date   Bronchitis    Chronic bronchitis (HCC)    Cystitis    Hematuria    urologice eval, Dr Clifton Dames   History of chest pain    Hyperbilirubinemia    Hyperlipemia    Hypertelorism    Hypertension    Leukocytosis    Obesity    Osteoarthritis    right knee   Pre-diabetes    Sleep apnea 2012   slight   no CPAP   Vaginal Pap smear, abnormal    Vitamin D  deficiency 02/09/2018   Past Surgical History:  Procedure Laterality Date   CERVICAL POLYPECTOMY  07/15/2020   Procedure: POLYPECTOMY with Myosure;  Surgeon: Lenord Radon, MD;  Location: [facility] OR;  Service: Gynecology;;   COLONOSCOPY WITH PROPOFOL  N/A 03/27/2015   Procedure: COLONOSCOPY WITH PROPOFOL ;  Surgeon: Janel Medford, MD;  Location: [facility];  Service: Endoscopy;  Laterality: N/A;   COLONOSCOPY WITH PROPOFOL  N/A 10/02/2020   Procedure: COLONOSCOPY WITH PROPOFOL ;  Surgeon: Janel Medford, MD;  Location: [facility];  Service: Endoscopy;  Laterality: N/A;   COLPOSCOPY N/A 07/15/2020   Procedure: COLPOSCOPY;  Surgeon: Lenord Radon, MD;  Location: [facility] OR;  Service: Gynecology;  Laterality: N/A;   DILATATION & CURETTAGE/HYSTEROSCOPY WITH MYOSURE N/A 01/05/2017   Procedure: DILATATION &  CURETTAGE/HYSTEROSCOPY WITH MYOSURE;  Surgeon: Johnn Najjar, MD;  Location: [facility] ORS;  Service: Gynecology;  Laterality: N/A;  PostMenopausal Bleeding   HYSTEROSCOPY WITH D & C N/A 05/22/2019   Procedure: DILATATION AND CURETTAGE /HYSTEROSCOPY, POLYPECTOMY WITH MYOSURE;  Surgeon: Lenord Radon, MD;  Location: [facility];  Service: Gynecology;  Laterality: N/A;   HYSTEROSCOPY WITH D & C N/A 07/15/2020   Procedure: DILATATION AND CURETTAGE /HYSTEROSCOPY;  Surgeon: Lenord Radon, MD;  Location: [facility] OR;  Service: Gynecology;  Laterality: N/A;   KNEE ARTHROSCOPY  04/2007   left knee   KNEE CLOSED REDUCTION Right 10/27/2023   Procedure: MANIPULATION, KNEE, CLOSED;  Surgeon: Arnie Lao, MD;  Location: [facility];  Service: Orthopedics;  Laterality: Right;   LAPAROSCOPIC GASTRIC SLEEVE RESECTION     TOTAL KNEE ARTHROPLASTY Left 10/2008   Dr Franz Jacks   TOTAL KNEE ARTHROPLASTY Right 08/26/2023   Procedure: ARTHROPLASTY, KNEE, TOTAL;  Surgeon: Arnie Lao, MD;  Location: [facility];  Service: Orthopedics;  Laterality: Right;   UPPER GI ENDOSCOPY N/A 10/14/2020   Procedure: UPPER GI ENDOSCOPY;  Surgeon: Aldean Hummingbird, MD;  Location: [facility];  Service: General;  Laterality: N/A;   Patient Active Problem List   Diagnosis Date Noted   Arthrofibrosis of knee joint, right 10/12/2023   Status post total  right knee replacement 08/26/2023   Sleep disturbance 01/21/2023   Lactose intolerance 10/22/2022   S/P gastric sleeve procedure 11/04/2020   Uncomplicated asthma 11/04/2020   Gastroesophageal reflux disease 11/04/2020   Primary osteoarthritis of both hips 09/30/2020   History of total left knee replacement 07/29/2020   Endometrial polyp 07/15/2020   Low grade squamous intraepithelial lesion (LGSIL) on cervical Pap smear 07/07/2020   Upper abdominal pain 08/20/2019   Right hip pain 05/28/2019   Post-menopausal bleeding 05/16/2019   Greater trochanteric pain syndrome of left  lower extremity 03/20/2019   Type 2 diabetes mellitus without complication, without long-term current use of insulin  (HCC) 05/18/2018   Vitamin D  deficiency 02/09/2018   Onychomycosis 09/19/2017   Tinea pedis of both feet 09/19/2017   Obstructive chronic bronchitis with exacerbation (HCC) 05/23/2017   Mixed hyperlipidemia 05/01/2014   Genital herpes 05/23/2012   Stress incontinence 03/04/2012   Unilateral primary osteoarthritis, right knee 10/12/2011   CTS (carpal tunnel syndrome) 07/13/2011   OSA (obstructive sleep apnea) 12/10/2010   Severe obesity (BMI >= 40) (HCC) 07/31/2008   Essential hypertension 07/03/2008    PCP: Dorrene Gaucher, NP  REFERRING PROVIDER: Arnie Lao, MD  REFERRING DIAG: M17.11 (ICD-10-CM) - Unilateral primary osteoarthritis, right knee  Rationale for Evaluation and Treatment: Rehabilitation  THERAPY DIAG:  No diagnosis found.  ONSET DATE: 08/26/23 (DOS)   SUBJECTIVE:                                                                                                                                                                                           SUBJECTIVE STATEMENT:  Cab was late coming to get me, that's why I was late    PERTINENT HISTORY:  besity, HTN, HLD, DM, OA, Lt TKA in 2010, s/p gastric sleeve resection 2022  PAIN:  NPRS scale: 2/10 Pain location: Rt knee Pain description: sore and tight  Aggravating factors: Knee flexion Relieving factors: Prescription pain medication, ice  PRECAUTIONS:  None  RED FLAGS: None   WEIGHT BEARING RESTRICTIONS:  No  FALLS:  Has patient fallen in last 6 months? No  LIVING ENVIRONMENT: Lives with: lives alone Lives in: House/apartment Stairs: Yes: External: 16 steps; can reach both Has following equipment at home: Walker - 4 wheeled  OCCUPATION:  On disability  PLOF:  Independent and Leisure: watching TV, shopping, has a fitness center at her apartment  complex  PATIENT GOALS:  Improve function in knee, get back to gym  OBJECTIVE:  Note: Objective measures were completed at Evaluation unless otherwise noted.  PATIENT SURVEYS:  Patient-Specific Activity Scoring Scheme  0 represents "unable to perform." 10  represents "able to perform at prior level. 0 1 2 3 4 5 6 7 8 9  10 (Date and Score)   Activity Eval  10/11/23   1. Walking 2 5   2. Bending the knee 2  5  3. Extending the knee 2 8  Score 2 6   Total score = sum of the activity scores/number of activities Minimum detectable change (90%CI) for average score = 2 points Minimum detectable change (90%CI) for single activity score = 3 points    COGNITIVE STATUS: Within functional limits for tasks assessed   SENSATION: WFL  GAIT: 09/08/23 Distance walked: 150' within clinic Assistive device utilized: Environmental consultant - 4 wheeled Level of assistance: Modified independence Comments: decreased stance on Rt, decr hip/knee flexion on Rt   EDEMA: 09/08/23  Knee joint line: Rt: 71 cm/Lt: 48.5 cm 09/19/23: knee joint line: Rt: 59.5 centimeters   LOWER EXTREMITY ROM:     ROM Right eval Left eval Rt 09/13/23 Rt 09/19/23 Right 09/21/23 Right 09/30/23 Rt 10/03/23 Rt 10/11/23 Rt 10/17/23 Right 10/28/2023 Right 10/31/2023 Right 11/04/23  Knee flexion A: 21 P: 41 A: 108 A: 48 P: 60 A: 50 P: 60 P: 71* P 56* AA: 50 P: 58 AA:58 P: 60 AA: 60 AAROM 92 Supine AROM heel slide:  82  Supine AAROM heel slide: 92    Supine AROM heel slide 72*, AAROM 84*  Knee extension A: 8 (seated LAQ) P: 0 A: 0 A: 10 P: 8 A: 10 P: 6   A: -7 P: -5 A: -5 P: -2 A: -5 -5     (Blank rows = not tested)   LOWER EXTREMITY MMT:   09/08/2023 Not formally tested at eval; Rt knee grossly 3-/5  MMT Right 09/28/2023 Rt 10/03/23 Seated HHD 10/17/23 Rt Seated  HHD  Hip flexion     Hip extension     Hip abduction     Hip adduction     Hip internal rotation     Hip external rotation     Knee flexion   18.7 22.4 19.2 25.4  Knee extension 4/5 20.5 21.3 30.6 33.1  Ankle dorsiflexion     Ankle plantarflexion     Ankle inversion     Ankle eversion      (Blank rows = not tested)                  Today's Treatment       DATE:    11/04/23  Nustep seat 9-->8 x10 minutes all four extremities for w/u and ROM  ROM update as above   Bridges with progressive knee flexion 3x5 Knee flexion stretch (self-AAROM) 10x5 seconds, only got bend to 80* HS stretches 6 inch box 3x30 seconds R LE   Performed by Casimer Clear, PT, DPT:  Knee flexion stretch on 6 box 3x30 sec on Rt Forward step ups onto 4 step no UE support x 10 reps bil   11/03/2023 Therex: Nustep Lvl 6 10 mins UE/LE incrementally pushing seat forward Standing hamstring stretch with foot on 6 step x30  Therapeutic Activity: R foot on 6 step knee flexion stretch x20 R only Step tap up on 6 step 2x10 R&L Standing foot on 6 step quad set with knee extended 2x10 R only  Gait Training: Heel/toe pattern, increased knee flexion during R swing phase, cane in L hand  Vaso 10 mins Rt knee in elevation medium compression 34 deg.    Today's Treatment  DATE: 11/02/2023 Therex: Nustep Lvl 6 10 mins UE/LE Seated LAQ Rt leg with contralateral leg movement opposite with pause in end ranges flexion/extension 4 lbs 2 x 15 Seated Rt knee flexion with Lt leg overpressure 15 sec x 5   Neuro Re-ed (balance control, muscle activation) SLS with 6 inch hurdle step over and back 2 x 10 bilateral with light hand assist on bar - focus on knee flexion over bar   Vaso 10 mins Rt knee in elevation medium compression 34 deg.   Today's Treatment       DATE: 11/01/2023 Therex: UBE UE/LE for knee rom.  Started in seat 10 for 3 mins then seat 9 7 mins for 10 mins total.  Full revolutions forward noted after 1-2 mins to begin.  Rt knee on step with lunge overpressure stretch 15 sec x 5 with foot on 8 inch step, bilateral hands on bars  for support.   TherActivity Leg press in available knee flexion range double leg 75 lbs x 20, single leg Rt 37 lbs 2 x 15  Step up 6 inch WB on Rt leg with focus on knee flexion to place foot , single hand on rail Rt arm, x 10 with SBA   Manual: Seated Rt knee flexion c distraction c movement /IR for mobility gains.  Cross friction massage performed for tissue mobility.  Cues for home use given.   Vaso 10 mins Rt knee in elevation medium compression 34 deg.   Today's Treatment       DATE: 10/31/2023 Therex: Nustep with focus on knee flexion mobility to tolerance 10 mins UE/LE  Seated Rt leg LAQ with end range pauses 2 x 15 Seated Rt knee flexion c Lt leg overpressure 15 sec hold x 10  Supine heel prop education for extension, performed 1 min Incline gastroc stretch 30 sec x 3 bilateral   TherActivity Sit to stand to sit 23 inch table height no UE assist x15   Manual: Seated Rt knee flexion c distraction c movement /IR for mobility gains.  Cross friction massage performed for tissue mobility.  Cues for home use given.   Vaso 10 mins Rt knee in elevation medium compression 34 deg.    Today's Treatment       DATE: 10/28/2023 Recumbent bike Seat 5 AAROM to full range 10 x backwards and 20 x forwards (10 minutes total) Seated knee flexion AAROM with PT overpressure 2 rounds of 10 x 10 seconds Supine knee flexion with strap and quad sets between reps 10 x 5 seconds each  Functional Activities: Double Leg Press 75# 10 x full range and stretch into flexion Single Leg Press 37# 10 x full range and stretch into flexion  Vaso Right Knee High 34* 10 minutes   PATIENT EDUCATION:  Education details: HEP Person educated: Patient Education method: Programmer, multimedia, Demonstration, and Handouts Education comprehension: verbalized understanding, returned demonstration, and needs further education  HOME EXERCISE PROGRAM: Access Code: [email or web address] URL: [email or web address] Date:  10/28/2023 Prepared by: Terral Ferrari  Exercises - Ankle Alphabet in Elevation  - 10 x daily - 7 x weekly - 1 sets - 10 reps - Quad Set  - 5-10 x daily - 7 x weekly - 1 sets - 5-10 reps - 5 sec hold - Seated Knee Flexion Extension AROM   - 5-10 x daily - 7 x weekly - 1 sets - 5-10 reps - 5 sec  hold - Supine Bridge  - 2 x daily - 7 x weekly -  2 sets - 10 reps - 3 seconds hold - Sit to Stand with Counter Support  - 2 x daily - 7 x weekly - 2 sets - 10 reps - Seated Heel Slide  - 2 x daily - 7 x weekly - 2 sets - 10 reps - Seated Knee Flexion AAROM  - 10 x daily - 7 x weekly - 1 sets - 10 reps - 10 second hold   ASSESSMENT:  CLINICAL IMPRESSION:   Arrived a little late today due to her cab arriving late. We continued working on ROM and strength as appropriate after her manipulation, this PT started the treatment and then we transitioned her care to another therapist that had an available slot when my next pt arrived. Making slow but steady progress after her manipulation. Had to cut session short in the end due to her transportation arriving early.   OBJECTIVE IMPAIRMENTS: Abnormal gait, decreased balance, decreased endurance, decreased knowledge of use of DME, decreased mobility, difficulty walking, decreased ROM, decreased strength, hypomobility, increased edema, increased fascial restrictions, increased muscle spasms, and pain.    GOALS: Goals reviewed with patient? Yes  SHORT TERM GOALS: Target date: 10/06/2023  Independent with initial HEP Goal status: MET 10/03/23  2.  Rt knee ROM improved to 0-90 for improved mobility Goal status: Partially met 10/28/2023   LONG TERM GOALS: Target date: 11/25/2023  Independent with final HEP Goal status: Ongoing  10/28/2023  2.  PSFS score improved by 3 points Goal status: MET 10/11/23  3.  Rt knee AROM improved to 0-100 for improved function and mobility Goal status:  Ongoing  10/28/2023  4.  Report pain < 4/10 with standing and walking  for improved function Goal status: Ongoing  10/28/2023  5.  Amb without AD without significant deviations for improved community access Goal status: Ongoing  10/28/2023    PLAN:  PT FREQUENCY: 3-5x/wk  PT DURATION: 4 additional weeks  PLANNED INTERVENTIONS: 97164- PT Re-evaluation, 97750- Physical Performance Testing, 97110-Therapeutic exercises, 97530- Therapeutic activity, 97112- Neuromuscular re-education, 97535- Self Care, 16109- Manual therapy, [phone]- Gait training, [phone]- Aquatic Therapy, [phone]- Electrical stimulation (unattended), 97016- Vasopneumatic device, Patient/Family education, Balance training, Stair training, Taping, Dry Needling, Joint mobilization, DME instructions, Cryotherapy, and Moist heat.  PLAN FOR NEXT SESSION: Continued focus on functional strengthening and knee flexion mobility gains, edema control and manual PRN    Terrel Ferries, PT, DPT 11/04/23 12:09 PM

## 2023-11-07 ENCOUNTER — Other Ambulatory Visit: Payer: Self-pay | Admitting: Orthopaedic Surgery

## 2023-11-07 ENCOUNTER — Ambulatory Visit (INDEPENDENT_AMBULATORY_CARE_PROVIDER_SITE_OTHER): Admitting: Rehabilitative and Restorative Service Providers"

## 2023-11-07 ENCOUNTER — Telehealth: Payer: Self-pay | Admitting: *Deleted

## 2023-11-07 ENCOUNTER — Encounter: Payer: Self-pay | Admitting: Rehabilitative and Restorative Service Providers"

## 2023-11-07 DIAGNOSIS — M25561 Pain in right knee: Secondary | ICD-10-CM | POA: Diagnosis not present

## 2023-11-07 DIAGNOSIS — R600 Localized edema: Secondary | ICD-10-CM | POA: Diagnosis not present

## 2023-11-07 DIAGNOSIS — M25661 Stiffness of right knee, not elsewhere classified: Secondary | ICD-10-CM | POA: Diagnosis not present

## 2023-11-07 MED ORDER — OXYCODONE HCL 5 MG PO TABS: 5.0000 mg | ORAL_TABLET | Freq: Four times a day (QID) | ORAL | 0 refills | Status: DC | PRN

## 2023-11-07 NOTE — Therapy (Signed)
 OUTPATIENT PHYSICAL THERAPY TREATMENT   Patient Name: Leah Hampton MRN: [id number] DOB:[birth year], 68 y.o., female Today's Date: 11/07/2023   END OF SESSION:  PT End of Session - 11/07/23 0945     Visit Number 22    Number of Visits 24    Date for PT Re-Evaluation 11/25/23    Authorization Type [employer] Medicare Dual Complete    Authorization Time Period No authorization needed    Progress Note Due on Visit 24    PT Start Time 0930    PT Stop Time 1020    PT Time Calculation (min) 50 min    Activity Tolerance Patient tolerated treatment well    Behavior During Therapy [facility] for tasks assessed/performed            Past Medical History:  Diagnosis Date   Bronchitis    Chronic bronchitis (HCC)    Cystitis    Hematuria    urologice eval, Dr Alfonzo   History of chest pain    Hyperbilirubinemia    Hyperlipemia    Hypertelorism    Hypertension    Leukocytosis    Obesity    Osteoarthritis    right knee   Pre-diabetes    Sleep apnea 2012   slight   no CPAP   Vaginal Pap smear, abnormal    Vitamin D  deficiency 02/09/2018   Past Surgical History:  Procedure Laterality Date   CERVICAL POLYPECTOMY  07/15/2020   Procedure: POLYPECTOMY with Myosure;  Surgeon: Corene Coy, MD;  Location: [facility] OR;  Service: Gynecology;;   COLONOSCOPY WITH PROPOFOL  N/A 03/27/2015   Procedure: COLONOSCOPY WITH PROPOFOL ;  Surgeon: Toribio SHAUNNA Cedar, MD;  Location: [facility] ENDOSCOPY;  Service: Endoscopy;  Laterality: N/A;   COLONOSCOPY WITH PROPOFOL  N/A 10/02/2020   Procedure: COLONOSCOPY WITH PROPOFOL ;  Surgeon: Cedar Toribio SHAUNNA, MD;  Location: [facility] ENDOSCOPY;  Service: Endoscopy;  Laterality: N/A;   COLPOSCOPY N/A 07/15/2020   Procedure: COLPOSCOPY;  Surgeon: Corene Coy, MD;  Location: [facility] OR;  Service: Gynecology;  Laterality: N/A;   DILATATION & CURETTAGE/HYSTEROSCOPY WITH MYOSURE N/A 01/05/2017   Procedure: DILATATION & CURETTAGE/HYSTEROSCOPY WITH MYOSURE;  Surgeon: Timmie Norris, MD;  Location: [facility] ORS;  Service: Gynecology;  Laterality: N/A;  PostMenopausal Bleeding   HYSTEROSCOPY WITH D & C N/A 05/22/2019   Procedure: DILATATION AND CURETTAGE /HYSTEROSCOPY, POLYPECTOMY WITH MYOSURE;  Surgeon: Corene Coy, MD;  Location: [facility] ORS;  Service: Gynecology;  Laterality: N/A;   HYSTEROSCOPY WITH D & C N/A 07/15/2020   Procedure: DILATATION AND CURETTAGE /HYSTEROSCOPY;  Surgeon: Corene Coy, MD;  Location: [facility] OR;  Service: Gynecology;  Laterality: N/A;   KNEE ARTHROSCOPY  04/2007   left knee   KNEE CLOSED REDUCTION Right 10/27/2023   Procedure: MANIPULATION, KNEE, CLOSED;  Surgeon: Vernetta Lonni GRADE, MD;  Location: [facility] ORS;  Service: Orthopedics;  Laterality: Right;   LAPAROSCOPIC GASTRIC SLEEVE RESECTION     TOTAL KNEE ARTHROPLASTY Left 10/2008   Dr Sherryn   TOTAL KNEE ARTHROPLASTY Right 08/26/2023   Procedure: ARTHROPLASTY, KNEE, TOTAL;  Surgeon: Vernetta Lonni GRADE, MD;  Location: [facility] ORS;  Service: Orthopedics;  Laterality: Right;   UPPER GI ENDOSCOPY N/A 10/14/2020   Procedure: UPPER GI ENDOSCOPY;  Surgeon: Blush Locus, MD;  Location: [facility] ORS;  Service: General;  Laterality: N/A;   Patient Active Problem List   Diagnosis Date Noted   Arthrofibrosis of knee joint, right 10/12/2023   Status post total right knee replacement 08/26/2023   Sleep disturbance 01/21/2023   Lactose  intolerance 10/22/2022   S/P gastric sleeve procedure 11/04/2020   Uncomplicated asthma 11/04/2020   Gastroesophageal reflux disease 11/04/2020   Primary osteoarthritis of both hips 09/30/2020   History of total left knee replacement 07/29/2020   Endometrial polyp 07/15/2020   Low grade squamous intraepithelial lesion (LGSIL) on cervical Pap smear 07/07/2020   Upper abdominal pain 08/20/2019   Right hip pain 05/28/2019   Post-menopausal bleeding 05/16/2019   Greater trochanteric pain syndrome of left lower extremity 03/20/2019   Type 2 diabetes mellitus  without complication, without long-term current use of insulin  (HCC) 05/18/2018   Vitamin D  deficiency 02/09/2018   Onychomycosis 09/19/2017   Tinea pedis of both feet 09/19/2017   Obstructive chronic bronchitis with exacerbation (HCC) 05/23/2017   Mixed hyperlipidemia 05/01/2014   Genital herpes 05/23/2012   Stress incontinence 03/04/2012   Unilateral primary osteoarthritis, right knee 10/12/2011   CTS (carpal tunnel syndrome) 07/13/2011   OSA (obstructive sleep apnea) 12/10/2010   Severe obesity (BMI >= 40) (HCC) 07/31/2008   Essential hypertension 07/03/2008    PCP: Daryl Setter, NP  REFERRING PROVIDER: Vernetta Lonni GRADE, MD  REFERRING DIAG: M17.11 (ICD-10-CM) - Unilateral primary osteoarthritis, right knee  Rationale for Evaluation and Treatment: Rehabilitation  THERAPY DIAG:  Acute pain of right knee  Stiffness of right knee, not elsewhere classified  Localized edema  ONSET DATE: 08/26/23 (DOS)   SUBJECTIVE:                                                                                                                                                                                           SUBJECTIVE STATEMENT:  Cab was late coming to get me, that's why I was late    PERTINENT HISTORY:  besity, HTN, HLD, DM, OA, Lt TKA in 2010, s/p gastric sleeve resection 2022  PAIN:  NPRS scale: 2/10 Pain location: Rt knee Pain description: sore and tight  Aggravating factors: Knee flexion Relieving factors: Prescription pain medication, ice  PRECAUTIONS:  None  RED FLAGS: None   WEIGHT BEARING RESTRICTIONS:  No  FALLS:  Has patient fallen in last 6 months? No  LIVING ENVIRONMENT: Lives with: lives alone Lives in: House/apartment Stairs: Yes: External: 16 steps; can reach both Has following equipment at home: Walker - 4 wheeled  OCCUPATION:  On disability  PLOF:  Independent and Leisure: watching TV, shopping, has a fitness center at her  apartment complex  PATIENT GOALS:  Improve function in knee, get back to gym  OBJECTIVE:  Note: Objective measures were completed at Evaluation unless otherwise noted.  PATIENT SURVEYS:  Patient-Specific Activity Scoring Scheme  0 represents "unable to perform."  10 represents "able to perform at prior level. 0 1 2 3 4 5 6 7 8 9  10 (Date and Score)   Activity Eval  10/11/23   1. Walking 2 5   2. Bending the knee 2  5  3. Extending the knee 2 8  Score 2 6   Total score = sum of the activity scores/number of activities Minimum detectable change (90%CI) for average score = 2 points Minimum detectable change (90%CI) for single activity score = 3 points    COGNITIVE STATUS: Within functional limits for tasks assessed   SENSATION: WFL  GAIT: 09/08/23 Distance walked: 150' within clinic Assistive device utilized: Environmental consultant - 4 wheeled Level of assistance: Modified independence Comments: decreased stance on Rt, decr hip/knee flexion on Rt   EDEMA: 09/08/23  Knee joint line: Rt: 71 cm/Lt: 48.5 cm 09/19/23: knee joint line: Rt: 59.5 centimeters   LOWER EXTREMITY ROM:     ROM Right eval Left eval Rt 09/13/23 Rt 09/19/23 Right 09/21/23 Right 09/30/23 Rt 10/03/23 Rt 10/11/23 Rt 10/17/23 Right 10/28/2023 Right 10/31/2023 Right 11/04/23  Knee flexion A: 21 P: 41 A: 108 A: 48 P: 60 A: 50 P: 60 P: 71* P 56* AA: 50 P: 58 AA:58 P: 60 AA: 60 AAROM 92 Supine AROM heel slide:  82  Supine AAROM heel slide: 92    Supine AROM heel slide 72*, AAROM 84*  Knee extension A: 8 (seated LAQ) P: 0 A: 0 A: 10 P: 8 A: 10 P: 6   A: -7 P: -5 A: -5 P: -2 A: -5 -5     (Blank rows = not tested)   LOWER EXTREMITY MMT:   09/08/2023 Not formally tested at eval; Rt knee grossly 3-/5  MMT Right 09/28/2023 Rt 10/03/23 Seated HHD 10/17/23 Rt Seated  HHD  Hip flexion     Hip extension     Hip abduction     Hip adduction     Hip internal rotation     Hip external rotation     Knee flexion   18.7 22.4 19.2 25.4  Knee extension 4/5 20.5 21.3 30.6 33.1  Ankle dorsiflexion     Ankle plantarflexion     Ankle inversion     Ankle eversion      (Blank rows = not tested)                   Today's Treatment       DATE: 11/07/2023 Therex: UBE UE/LE for knee ROM, seat 11 full range lvl 1.0 8 mins.  Standing knee flexion on 6 inch step lean 15 sec x 6 Seated Rt leg LAQ with end range pauses in flexion and extension x 15  Gait Training Continued education and cues for sequencing of ambulation with cane in Lt hand.    TherActivity Leg press double leg 75 lbs x 15 with flexion to available range(increase weight next visit) , single leg 2 x 15  37 lbs  , performed bilaterally  Step up WB on Rt leg 6 inch step with Lt hand on bar x 10   Manual Seated Rt knee flexion mobilization c movement IR/distraction.    Today's Treatment       DATE: 11/04/23 Nustep seat 9-->8 x10 minutes all four extremities for w/u and ROM  ROM update as above   Bridges with progressive knee flexion 3x5 Knee flexion stretch (self-AAROM) 10x5 seconds, only got bend to 80* HS stretches 6 inch box 3x30 seconds R  LE   Performed by Corean Ku, PT, DPT:  Knee flexion stretch on 6 box 3x30 sec on Rt Forward step ups onto 4 step no UE support x 10 reps bil   Today's Treatment       DATE:11/03/2023 Therex: Nustep Lvl 6 10 mins UE/LE incrementally pushing seat forward Standing hamstring stretch with foot on 6 step x30  Therapeutic Activity: R foot on 6 step knee flexion stretch x20 R only Step tap up on 6 step 2x10 R&L Standing foot on 6 step quad set with knee extended 2x10 R only  Gait Training: Heel/toe pattern, increased knee flexion during R swing phase, cane in L hand  Vaso 10 mins Rt knee in elevation medium compression 34 deg.    Today's Treatment       DATE: 11/02/2023 Therex: Nustep Lvl 6 10 mins UE/LE Seated LAQ Rt leg with contralateral leg movement opposite with pause  in end ranges flexion/extension 4 lbs 2 x 15 Seated Rt knee flexion with Lt leg overpressure 15 sec x 5   Neuro Re-ed (balance control, muscle activation) SLS with 6 inch hurdle step over and back 2 x 10 bilateral with light hand assist on bar - focus on knee flexion over bar   Vaso 10 mins Rt knee in elevation medium compression 34 deg.     PATIENT EDUCATION:  Education details: HEP Person educated: Patient Education method: Programmer, multimedia, Facilities manager, and Handouts Education comprehension: verbalized understanding, returned demonstration, and needs further education  HOME EXERCISE PROGRAM: Access Code: [email or web address] URL: [email or web address] Date: 10/28/2023 Prepared by: Lamar Ivory  Exercises - Ankle Alphabet in Elevation  - 10 x daily - 7 x weekly - 1 sets - 10 reps - Quad Set  - 5-10 x daily - 7 x weekly - 1 sets - 5-10 reps - 5 sec hold - Seated Knee Flexion Extension AROM   - 5-10 x daily - 7 x weekly - 1 sets - 5-10 reps - 5 sec  hold - Supine Bridge  - 2 x daily - 7 x weekly - 2 sets - 10 reps - 3 seconds hold - Sit to Stand with Counter Support  - 2 x daily - 7 x weekly - 2 sets - 10 reps - Seated Heel Slide  - 2 x daily - 7 x weekly - 2 sets - 10 reps - Seated Knee Flexion AAROM  - 10 x daily - 7 x weekly - 1 sets - 10 reps - 10 second hold   ASSESSMENT:  CLINICAL IMPRESSION: Continued inclusion of focus in knee flexion gains through manual and intervention.  Improving tolerance to end range stretching noted.  Continued benefit from progressive mobility gains, strengthening to improve functional movement.  Has several more visits this week prior to return to [place] for follow up.   OBJECTIVE IMPAIRMENTS: Abnormal gait, decreased balance, decreased endurance, decreased knowledge of use of DME, decreased mobility, difficulty walking, decreased ROM, decreased strength, hypomobility, increased edema, increased fascial restrictions, increased muscle spasms, and  pain.    GOALS: Goals reviewed with patient? Yes  SHORT TERM GOALS: Target date: 10/06/2023  Independent with initial HEP Goal status: MET 10/03/23  2.  Rt knee ROM improved to 0-90 for improved mobility Goal status: Partially met 10/28/2023   LONG TERM GOALS: Target date: 11/25/2023  Independent with final HEP Goal status: Ongoing  10/28/2023  2.  PSFS score improved by 3 points Goal status: MET 10/11/23  3.  Rt knee AROM improved to 0-100  for improved function and mobility Goal status:  Ongoing  10/28/2023  4.  Report pain < 4/10 with standing and walking for improved function Goal status: Ongoing  10/28/2023  5.  Amb without AD without significant deviations for improved community access Goal status: Ongoing  10/28/2023    PLAN:  PT FREQUENCY: 3-5x/wk  PT DURATION: 4 additional weeks  PLANNED INTERVENTIONS: 97164- PT Re-evaluation, 97750- Physical Performance Testing, 97110-Therapeutic exercises, 97530- Therapeutic activity, 97112- Neuromuscular re-education, 97535- Self Care, [id number]- Manual therapy, [phone]- Gait training, [phone]- Aquatic Therapy, [phone]- Electrical stimulation (unattended), 97016- Vasopneumatic device, Patient/Family education, Balance training, Stair training, Taping, Dry Needling, Joint mobilization, DME instructions, Cryotherapy, and Moist heat.  PLAN FOR NEXT SESSION: Flexion gains, WB strengthening.   Ozell Silvan, PT, DPT, OCS, ATC 11/07/23  10:23 AM

## 2023-11-07 NOTE — Telephone Encounter (Signed)
 Pt. Came in requesting to get her OxyCODONE  refilled at [facility] on [address]. Please and Thanks

## 2023-11-08 ENCOUNTER — Encounter: Payer: Self-pay | Admitting: Physical Therapy

## 2023-11-08 ENCOUNTER — Ambulatory Visit (INDEPENDENT_AMBULATORY_CARE_PROVIDER_SITE_OTHER): Admitting: Physical Therapy

## 2023-11-08 DIAGNOSIS — M25561 Pain in right knee: Secondary | ICD-10-CM | POA: Diagnosis not present

## 2023-11-08 DIAGNOSIS — M25661 Stiffness of right knee, not elsewhere classified: Secondary | ICD-10-CM

## 2023-11-08 DIAGNOSIS — R600 Localized edema: Secondary | ICD-10-CM | POA: Diagnosis not present

## 2023-11-08 DIAGNOSIS — R2681 Unsteadiness on feet: Secondary | ICD-10-CM | POA: Diagnosis not present

## 2023-11-08 DIAGNOSIS — R2689 Other abnormalities of gait and mobility: Secondary | ICD-10-CM | POA: Diagnosis not present

## 2023-11-08 NOTE — Therapy (Signed)
 OUTPATIENT PHYSICAL THERAPY TREATMENT   Patient Name: Leah Hampton MRN: [id number] DOB:Nov 06, 1955, 68 y.o., female Today's Date: 11/08/2023   END OF SESSION:  PT End of Session - 11/08/23 1339     Visit Number 23    Number of Visits 24    Date for PT Re-Evaluation 11/25/23    Authorization Type [employer] Medicare Dual Complete    Authorization Time Period No authorization needed    Progress Note Due on Visit 24    PT Start Time 1339    PT Stop Time 1439    PT Time Calculation (min) 60 min    Activity Tolerance Patient tolerated treatment well    Behavior During Therapy [facility] for tasks assessed/performed             Past Medical History:  Diagnosis Date   Bronchitis    Chronic bronchitis (HCC)    Cystitis    Hematuria    urologice eval, Dr Alfonzo   History of chest pain    Hyperbilirubinemia    Hyperlipemia    Hypertelorism    Hypertension    Leukocytosis    Obesity    Osteoarthritis    right knee   Pre-diabetes    Sleep apnea 2012   slight   no CPAP   Vaginal Pap smear, abnormal    Vitamin D  deficiency 02/09/2018   Past Surgical History:  Procedure Laterality Date   CERVICAL POLYPECTOMY  07/15/2020   Procedure: POLYPECTOMY with Myosure;  Surgeon: Corene Coy, MD;  Location: [facility] OR;  Service: Gynecology;;   COLONOSCOPY WITH PROPOFOL  N/A 03/27/2015   Procedure: COLONOSCOPY WITH PROPOFOL ;  Surgeon: Toribio SHAUNNA Cedar, MD;  Location: [facility] ENDOSCOPY;  Service: Endoscopy;  Laterality: N/A;   COLONOSCOPY WITH PROPOFOL  N/A 10/02/2020   Procedure: COLONOSCOPY WITH PROPOFOL ;  Surgeon: Cedar Toribio SHAUNNA, MD;  Location: [facility];  Service: Endoscopy;  Laterality: N/A;   COLPOSCOPY N/A 07/15/2020   Procedure: COLPOSCOPY;  Surgeon: Corene Coy, MD;  Location: [facility] OR;  Service: Gynecology;  Laterality: N/A;   DILATATION & CURETTAGE/HYSTEROSCOPY WITH MYOSURE N/A 01/05/2017   Procedure: DILATATION & CURETTAGE/HYSTEROSCOPY WITH MYOSURE;  Surgeon: Timmie Norris, MD;  Location: [facility] ORS;  Service: Gynecology;  Laterality: N/A;  PostMenopausal Bleeding   HYSTEROSCOPY WITH D & C N/A 05/22/2019   Procedure: DILATATION AND CURETTAGE /HYSTEROSCOPY, POLYPECTOMY WITH MYOSURE;  Surgeon: Corene Coy, MD;  Location: [facility] ORS;  Service: Gynecology;  Laterality: N/A;   HYSTEROSCOPY WITH D & C N/A 07/15/2020   Procedure: DILATATION AND CURETTAGE /HYSTEROSCOPY;  Surgeon: Corene Coy, MD;  Location: [facility] OR;  Service: Gynecology;  Laterality: N/A;   KNEE ARTHROSCOPY  04/2007   left knee   KNEE CLOSED REDUCTION Right 10/27/2023   Procedure: MANIPULATION, KNEE, CLOSED;  Surgeon: Vernetta Lonni GRADE, MD;  Location: [facility] OR[facility];  Service: Orthopedics;  Laterality: Right;   LAPAROSCOPIC GASTRIC SLEEVE RESECTION     TOTAL KNEE ARTHROPLASTY Left 10/2008   Dr Sherryn   TOTAL KNEE ARTHROPLASTY Right 08/26/2023   Procedure: ARTHROPLASTY, KNEE, TOTAL;  Surgeon: Vernetta Lonni GRADE, MD;  Location: [facility] ORS;  Service: Orthopedics;  Laterality: Right;   UPPER GI ENDOSCOPY N/A 10/14/2020   Procedure: UPPER GI ENDOSCOPY;  Surgeon: Blush Locus, MD;  Location: [facility] OR[facility];  Service: General;  Laterality: N/A;   Patient Active Problem List   Diagnosis Date Noted   Arthrofibrosis of knee joint, right 10/12/2023   Status post total right knee replacement 08/26/2023   Sleep disturbance 01/21/2023  Lactose intolerance 10/22/2022   S/P gastric sleeve procedure 11/04/2020   Uncomplicated asthma 11/04/2020   Gastroesophageal reflux disease 11/04/2020   Primary osteoarthritis of both hips 09/30/2020   History of total left knee replacement 07/29/2020   Endometrial polyp 07/15/2020   Low grade squamous intraepithelial lesion (LGSIL) on cervical Pap smear 07/07/2020   Upper abdominal pain 08/20/2019   Right hip pain 05/28/2019   Post-menopausal bleeding 05/16/2019   Greater trochanteric pain syndrome of left lower extremity 03/20/2019   Type 2 diabetes mellitus  without complication, without long-term current use of insulin  (HCC) 05/18/2018   Vitamin D  deficiency 02/09/2018   Onychomycosis 09/19/2017   Tinea pedis of both feet 09/19/2017   Obstructive chronic bronchitis with exacerbation (HCC) 05/23/2017   Mixed hyperlipidemia 05/01/2014   Genital herpes 05/23/2012   Stress incontinence 03/04/2012   Unilateral primary osteoarthritis, right knee 10/12/2011   CTS (carpal tunnel syndrome) 07/13/2011   OSA (obstructive sleep apnea) 12/10/2010   Severe obesity (BMI >= 40) (HCC) 07/31/2008   Essential hypertension 07/03/2008    PCP: Daryl Setter, NP  REFERRING PROVIDER: Vernetta Lonni GRADE, MD  REFERRING DIAG: M17.11 (ICD-10-CM) - Unilateral primary osteoarthritis, right knee  Rationale for Evaluation and Treatment: Rehabilitation  THERAPY DIAG:  Acute pain of right knee  Stiffness of right knee, not elsewhere classified  Localized edema  Other abnormalities of gait and mobility  Unsteadiness on feet  ONSET DATE: 08/26/23 (DOS)   SUBJECTIVE:                                                                                                                                                                                           SUBJECTIVE STATEMENT: She has been doing her exercises some when not in PT.    PERTINENT HISTORY:  besity, HTN, HLD, DM, OA, Lt TKA in 2010, s/p gastric sleeve resection 2022  PAIN:  NPRS scale: at rest 3/10, standing/walking 1-4/10 and bending with exercising 5/10 Pain location: Rt knee Pain description: sore and tight  Aggravating factors: Knee flexion Relieving factors: Prescription pain medication, ice  PRECAUTIONS:  None  RED FLAGS: None   WEIGHT BEARING RESTRICTIONS:  No  FALLS:  Has patient fallen in last 6 months? No  LIVING ENVIRONMENT: Lives with: lives alone Lives in: House/apartment Stairs: Yes: External: 16 steps; can reach both Has following equipment at home: Walker  - 4 wheeled  OCCUPATION:  On disability  PLOF:  Independent and Leisure: watching TV, shopping, has a fitness center at her apartment complex  PATIENT GOALS:  Improve function in knee, get back to gym  OBJECTIVE:  Note: Objective measures were completed  at Evaluation unless otherwise noted.  PATIENT SURVEYS:  Patient-Specific Activity Scoring Scheme  0 represents "unable to perform." 10 represents "able to perform at prior level. 0 1 2 3 4 5 6 7 8 9  10 (Date and Score)   Activity Eval  10/11/23   1. Walking 2 5   2. Bending the knee 2  5  3. Extending the knee 2 8  Score 2 6   Total score = sum of the activity scores/number of activities Minimum detectable change (90%CI) for average score = 2 points Minimum detectable change (90%CI) for single activity score = 3 points    COGNITIVE STATUS: Within functional limits for tasks assessed   SENSATION: WFL  GAIT: 09/08/23 Distance walked: 150' within clinic Assistive device utilized: Environmental consultant - 4 wheeled Level of assistance: Modified independence Comments: decreased stance on Rt, decr hip/knee flexion on Rt   EDEMA: 09/08/23  Knee joint line: Rt: 71 cm/Lt: 48.5 cm 09/19/23: knee joint line: Rt: 59.5 centimeters   LOWER EXTREMITY ROM:     ROM Right eval Left eval Rt 09/13/23 Rt 09/19/23 Right 09/21/23 Right  09/30/23 Rt 10/03/23 Rt 10/11/23 Rt 10/17/23 Right  10/28/23 Right 10/31/23 Right 11/04/23 Right 11/08/23  Knee flexion A: 21 P: 41 A: 108 A: 48 P: 60 A: 50 P: 60 P: 71* P 56* AA: 50 P: 58 AA:58 P: 60 AA: 60 AAROM 92 Supine AROM heel slide:  82  Supine AAROM heel slide: 92    Supine AROM heel slide 72*, AAROM 84* Seated P: 84*  Supine  AA: 86*  Knee extension A: 8 (seated LAQ) P: 0 A: 0 A: 10 P: 8 A: 10 P: 6   A: -7 P: -5 A: -5 P: -2 A: -5 -5      (Blank rows = not tested)   LOWER EXTREMITY MMT:   09/08/2023 Not formally tested at eval; Rt knee grossly 3-/5  MMT Right 09/28/2023  Rt 10/03/23 Seated HHD 10/17/23 Rt Seated  HHD  Hip flexion     Hip extension     Hip abduction     Hip adduction     Hip internal rotation     Hip external rotation     Knee flexion  18.7 22.4 19.2 25.4  Knee extension 4/5 20.5 21.3 30.6 33.1  Ankle dorsiflexion     Ankle plantarflexion     Ankle inversion     Ankle eversion      (Blank rows = not tested)                   Today's Treatment       DATE: 11/08/2023 Therex:  SciFit UE/LE for 4 min, then LE only for 2 min for knee ROM, seat 11 full range lvl 1.0 Pt questioned bike at fitness center where she lives.  PT explained difference in bike design. Precor seat 5 rocking flexion stretch 5 sec both forward & backward for 3 min.   Standing knee flexion on 18 inch chair against wall BUE support on chair backs with lean 10 sec x 3 reps 3 sets moving LLE stance 1-2 closer each set   Seated Rt leg LAQ and contralateral LE active knee flexion with end range pauses in flexion and extension x 15 reps Supine AA knee flexion (towel holding femur vertical) gravity assisted knee flexion 15 reps.   TherActivity Leg press double leg 87 lbs x 15 with flexion to available range, single leg 2 x 15  43 lbs  , performed bilaterally  Step up & step down WB on Rt leg 6 inch step with BUE // bars x 10   Manual Seated Rt knee flexion mobilization c movement IR/distraction.   Vaso 10 mins Rt knee in elevation medium compression 34 deg with elevation.    Treatment       DATE: 11/07/2023 Therex: UBE UE/LE for knee ROM, seat 11 full range lvl 1.0 8 mins.  Standing knee flexion on 6 inch step lean 15 sec x 6 Seated Rt leg LAQ with end range pauses in flexion and extension x 15  Gait Training Continued education and cues for sequencing of ambulation with cane in Lt hand.    TherActivity Leg press double leg 75 lbs x 15 with flexion to available range(increase weight next visit) , single leg 2 x 15  37 lbs  , performed bilaterally  Step up  WB on Rt leg 6 inch step with Lt hand on bar x 10   Manual Seated Rt knee flexion mobilization c movement IR/distraction.    Treatment       DATE: 11/04/23 Nustep seat 9-->8 x10 minutes all four extremities for w/u and ROM  ROM update as above   Bridges with progressive knee flexion 3x5 Knee flexion stretch (self-AAROM) 10x5 seconds, only got bend to 80* HS stretches 6 inch box 3x30 seconds R LE   Performed by Corean Ku, PT, DPT:  Knee flexion stretch on 6 box 3x30 sec on Rt Forward step ups onto 4 step no UE support x 10 reps bil   Treatment       DATE:11/03/2023 Therex: Nustep Lvl 6 10 mins UE/LE incrementally pushing seat forward Standing hamstring stretch with foot on 6 step x30  Therapeutic Activity: R foot on 6 step knee flexion stretch x20 R only Step tap up on 6 step 2x10 R&L Standing foot on 6 step quad set with knee extended 2x10 R only  Gait Training: Heel/toe pattern, increased knee flexion during R swing phase, cane in L hand  Vaso 10 mins Rt knee in elevation medium compression 34 deg.      PATIENT EDUCATION:  Education details: HEP Person educated: Patient Education method: Programmer, multimedia, Facilities manager, and Handouts Education comprehension: verbalized understanding, returned demonstration, and needs further education  HOME EXERCISE PROGRAM: Access Code: [email or web address] URL: [email or web address] Date: 10/28/2023 Prepared by: Lamar Ivory  Exercises - Ankle Alphabet in Elevation  - 10 x daily - 7 x weekly - 1 sets - 10 reps - Quad Set  - 5-10 x daily - 7 x weekly - 1 sets - 5-10 reps - 5 sec hold - Seated Knee Flexion Extension AROM   - 5-10 x daily - 7 x weekly - 1 sets - 5-10 reps - 5 sec  hold - Supine Bridge  - 2 x daily - 7 x weekly - 2 sets - 10 reps - 3 seconds hold - Sit to Stand with Counter Support  - 2 x daily - 7 x weekly - 2 sets - 10 reps - Seated Heel Slide  - 2 x daily - 7 x weekly - 2 sets - 10 reps - Seated  Knee Flexion AAROM  - 10 x daily - 7 x weekly - 1 sets - 10 reps - 10 second hold   ASSESSMENT:  CLINICAL IMPRESSION: Pt had small increase in ROM.  She needs encouragement for compliance with HEP.  Patient continues to benefit from skilled PT.   OBJECTIVE IMPAIRMENTS:  Abnormal gait, decreased balance, decreased endurance, decreased knowledge of use of DME, decreased mobility, difficulty walking, decreased ROM, decreased strength, hypomobility, increased edema, increased fascial restrictions, increased muscle spasms, and pain.    GOALS: Goals reviewed with patient? Yes  SHORT TERM GOALS: Target date: 10/06/2023  Independent with initial HEP Goal status: MET 10/03/23  2.  Rt knee ROM improved to 0-90 for improved mobility Goal status: Partially met 10/28/2023   LONG TERM GOALS: Target date: 11/25/2023  Independent with final HEP Goal status: Ongoing  11/08/2023  2.  PSFS score improved by 3 points Goal status: MET 10/11/23  3.  Rt knee AROM improved to 0-100 for improved function and mobility Goal status:  Ongoing   11/08/2023  4.  Report pain < 4/10 with standing and walking for improved function Goal status: Ongoing   11/08/2023  5.  Amb without AD without significant deviations for improved community access Goal status: Ongoing  11/08/2023    PLAN:  PT FREQUENCY: 3-5x/wk  PT DURATION: 4 additional weeks  PLANNED INTERVENTIONS: 97164- PT Re-evaluation, 97750- Physical Performance Testing, 97110-Therapeutic exercises, 97530- Therapeutic activity, 97112- Neuromuscular re-education, 97535- Self Care, [id number]- Manual therapy, [phone]- Gait training, [phone]- Aquatic Therapy, [phone]- Electrical stimulation (unattended), 97016- Vasopneumatic device, Patient/Family education, Balance training, Stair training, Taping, Dry Needling, Joint mobilization, DME instructions, Cryotherapy, and Moist heat.  PLAN FOR NEXT SESSION: Progress Note next visit,   Flexion gains, WB strengthening.     Grayce Spatz, PT, DPT 11/08/2023, 3:11 PM

## 2023-11-09 ENCOUNTER — Encounter: Payer: Self-pay | Admitting: Rehabilitative and Restorative Service Providers"

## 2023-11-09 ENCOUNTER — Ambulatory Visit (INDEPENDENT_AMBULATORY_CARE_PROVIDER_SITE_OTHER): Admitting: Rehabilitative and Restorative Service Providers"

## 2023-11-09 DIAGNOSIS — R2681 Unsteadiness on feet: Secondary | ICD-10-CM

## 2023-11-09 DIAGNOSIS — M25661 Stiffness of right knee, not elsewhere classified: Secondary | ICD-10-CM | POA: Diagnosis not present

## 2023-11-09 DIAGNOSIS — M25561 Pain in right knee: Secondary | ICD-10-CM

## 2023-11-09 DIAGNOSIS — R600 Localized edema: Secondary | ICD-10-CM

## 2023-11-09 DIAGNOSIS — R2689 Other abnormalities of gait and mobility: Secondary | ICD-10-CM | POA: Diagnosis not present

## 2023-11-09 NOTE — Therapy (Signed)
 OUTPATIENT PHYSICAL THERAPY TREATMENT / RECERT   Patient Name: Leah Hampton MRN: [id number] DOB:03/23/1956, 68 y.o., female Today's Date: 11/09/2023  Progress Note Reporting Period 10/28/2023 to 11/09/2023  See note below for Objective Data and Assessment of Progress/Goals.   END OF SESSION:  PT End of Session - 11/09/23 0929     Visit Number 24    Number of Visits 30    Date for PT Re-Evaluation 12/08/23    Authorization Type [employer] Medicare Dual Complete    Authorization Time Period No authorization needed    Progress Note Due on Visit 30    PT Start Time 0925    PT Stop Time 1014    PT Time Calculation (min) 49 min    Activity Tolerance Patient tolerated treatment well    Behavior During Therapy [facility] for tasks assessed/performed              Past Medical History:  Diagnosis Date   Bronchitis    Chronic bronchitis (HCC)    Cystitis    Hematuria    urologice eval, Dr Alfonzo   History of chest pain    Hyperbilirubinemia    Hyperlipemia    Hypertelorism    Hypertension    Leukocytosis    Obesity    Osteoarthritis    right knee   Pre-diabetes    Sleep apnea 2012   slight   no CPAP   Vaginal Pap smear, abnormal    Vitamin D  deficiency 02/09/2018   Past Surgical History:  Procedure Laterality Date   CERVICAL POLYPECTOMY  07/15/2020   Procedure: POLYPECTOMY with Myosure;  Surgeon: Corene Coy, MD;  Location: [facility] OR;  Service: Gynecology;;   COLONOSCOPY WITH PROPOFOL  N/A 03/27/2015   Procedure: COLONOSCOPY WITH PROPOFOL ;  Surgeon: Toribio SHAUNNA Cedar, MD;  Location: [facility];  Service: Endoscopy;  Laterality: N/A;   COLONOSCOPY WITH PROPOFOL  N/A 10/02/2020   Procedure: COLONOSCOPY WITH PROPOFOL ;  Surgeon: Cedar Toribio SHAUNNA, MD;  Location: [facility];  Service: Endoscopy;  Laterality: N/A;   COLPOSCOPY N/A 07/15/2020   Procedure: COLPOSCOPY;  Surgeon: Corene Coy, MD;  Location: [facility] OR;  Service: Gynecology;  Laterality: N/A;   DILATATION  & CURETTAGE/HYSTEROSCOPY WITH MYOSURE N/A 01/05/2017   Procedure: DILATATION & CURETTAGE/HYSTEROSCOPY WITH MYOSURE;  Surgeon: Timmie Norris, MD;  Location: [facility] ORS;  Service: Gynecology;  Laterality: N/A;  PostMenopausal Bleeding   HYSTEROSCOPY WITH D & C N/A 05/22/2019   Procedure: DILATATION AND CURETTAGE /HYSTEROSCOPY, POLYPECTOMY WITH MYOSURE;  Surgeon: Corene Coy, MD;  Location: [facility];  Service: Gynecology;  Laterality: N/A;   HYSTEROSCOPY WITH D & C N/A 07/15/2020   Procedure: DILATATION AND CURETTAGE /HYSTEROSCOPY;  Surgeon: Corene Coy, MD;  Location: [facility] OR;  Service: Gynecology;  Laterality: N/A;   KNEE ARTHROSCOPY  04/2007   left knee   KNEE CLOSED REDUCTION Right 10/27/2023   Procedure: MANIPULATION, KNEE, CLOSED;  Surgeon: Vernetta Lonni GRADE, MD;  Location: [facility];  Service: Orthopedics;  Laterality: Right;   LAPAROSCOPIC GASTRIC SLEEVE RESECTION     TOTAL KNEE ARTHROPLASTY Left 10/2008   Dr Sherryn   TOTAL KNEE ARTHROPLASTY Right 08/26/2023   Procedure: ARTHROPLASTY, KNEE, TOTAL;  Surgeon: Vernetta Lonni GRADE, MD;  Location: [facility];  Service: Orthopedics;  Laterality: Right;   UPPER GI ENDOSCOPY N/A 10/14/2020   Procedure: UPPER GI ENDOSCOPY;  Surgeon: Blush Locus, MD;  Location: [facility];  Service: General;  Laterality: N/A;   Patient Active Problem List   Diagnosis Date Noted  Arthrofibrosis of knee joint, right 10/12/2023   Status post total right knee replacement 08/26/2023   Sleep disturbance 01/21/2023   Lactose intolerance 10/22/2022   S/P gastric sleeve procedure 11/04/2020   Uncomplicated asthma 11/04/2020   Gastroesophageal reflux disease 11/04/2020   Primary osteoarthritis of both hips 09/30/2020   History of total left knee replacement 07/29/2020   Endometrial polyp 07/15/2020   Low grade squamous intraepithelial lesion (LGSIL) on cervical Pap smear 07/07/2020   Upper abdominal pain 08/20/2019   Right hip pain 05/28/2019    Post-menopausal bleeding 05/16/2019   Greater trochanteric pain syndrome of left lower extremity 03/20/2019   Type 2 diabetes mellitus without complication, without long-term current use of insulin  (HCC) 05/18/2018   Vitamin D  deficiency 02/09/2018   Onychomycosis 09/19/2017   Tinea pedis of both feet 09/19/2017   Obstructive chronic bronchitis with exacerbation (HCC) 05/23/2017   Mixed hyperlipidemia 05/01/2014   Genital herpes 05/23/2012   Stress incontinence 03/04/2012   Unilateral primary osteoarthritis, right knee 10/12/2011   CTS (carpal tunnel syndrome) 07/13/2011   OSA (obstructive sleep apnea) 12/10/2010   Severe obesity (BMI >= 40) (HCC) 07/31/2008   Essential hypertension 07/03/2008    PCP: Daryl Setter, NP  REFERRING PROVIDER: Vernetta Lonni GRADE, MD  REFERRING DIAG: M17.11 (ICD-10-CM) - Unilateral primary osteoarthritis, right knee  Rationale for Evaluation and Treatment: Rehabilitation  THERAPY DIAG:  Acute pain of right knee - Plan: PT plan of care cert/re-cert  Stiffness of right knee, not elsewhere classified - Plan: PT plan of care cert/re-cert  Localized edema - Plan: PT plan of care cert/re-cert  Other abnormalities of gait and mobility - Plan: PT plan of care cert/re-cert  Unsteadiness on feet - Plan: PT plan of care cert/re-cert  ONSET DATE: 08/26/23 (DOS)   SUBJECTIVE:                                                                                                                                                                                           SUBJECTIVE STATEMENT: Pt indicated feeling sleepy/tired.  Reported taking pain medicine this morning.   Pt rated overall improvement around 60%.   PERTINENT HISTORY:  besity, HTN, HLD, DM, OA, Lt TKA in 2010, s/p gastric sleeve resection 2022  PAIN:  NPRS scale: at worst 4/10 Pain location: Rt knee Pain description: sore and tight  Aggravating factors: Knee flexion Relieving factors:  Prescription pain medication, ice  PRECAUTIONS:  None  RED FLAGS: None   WEIGHT BEARING RESTRICTIONS:  No  FALLS:  Has patient fallen in last 6 months? No  LIVING ENVIRONMENT: Lives with: lives alone Lives in: House/apartment Stairs: Yes:  External: 16 steps; can reach both Has following equipment at home: Walker - 4 wheeled  OCCUPATION:  On disability  PLOF:  Independent and Leisure: watching TV, shopping, has a fitness center at her apartment complex  PATIENT GOALS:  Improve function in knee, get back to gym  OBJECTIVE:  Note: Objective measures were completed at Evaluation unless otherwise noted.  PATIENT SURVEYS:  Patient-Specific Activity Scoring Scheme  0 represents "unable to perform." 10 represents "able to perform at prior level. 0 1 2 3 4 5 6 7 8 9  10 (Date and Score)   Activity Eval  10/11/23  11/09/2023  1. Walking 2 5  7   2. Bending the knee(sitting, transfers) 2  5 5   3. Extending the knee 2 8 6   Score 2 6 6    Total score = sum of the activity scores/number of activities Minimum detectable change (90%CI) for average score = 2 points Minimum detectable change (90%CI) for single activity score = 3 points  COGNITIVE STATUS: Within functional limits for tasks assessed   SENSATION: WFL  GAIT: 09/08/23 Distance walked: 150' within clinic Assistive device utilized: Environmental consultant - 4 wheeled Level of assistance: Modified independence Comments: decreased stance on Rt, decr hip/knee flexion on Rt   EDEMA: 09/19/23: knee joint line: Rt: 59.5 centimeters  09/08/23  Knee joint line: Rt: 71 cm/Lt: 48.5 cm    LOWER EXTREMITY ROM:     ROM Right eval Left eval Rt 09/13/23 Rt 09/19/23 Right 09/21/23 Right  09/30/23 Rt 10/03/23 Rt 10/11/23 Rt 10/17/23 Right  10/28/23 Right 10/31/23 Right 11/04/23 Right 11/08/23  Knee flexion A: 21 P: 41 A: 108 A: 48 P: 60 A: 50 P: 60 P: 71* P 56* AA: 50 P: 58 AA:58 P: 60 AA: 60 AAROM 92 Supine AROM heel slide:   82  Supine AAROM heel slide: 92    Supine AROM heel slide 72*, AAROM 84* Seated P: 84*  Supine  AA: 86*  Knee extension A: 8 (seated LAQ) P: 0 A: 0 A: 10 P: 8 A: 10 P: 6   A: -7 P: -5 A: -5 P: -2 A: -5 -5      (Blank rows = not tested)   LOWER EXTREMITY MMT:   09/08/2023 Not formally tested at eval; Rt knee grossly 3-/5  MMT Right 09/28/2023 Rt 10/03/23 Seated HHD 10/17/23 Rt Seated  HHD Right 11/09/2023  Hip flexion      Hip extension      Hip abduction      Hip adduction      Hip internal rotation      Hip external rotation      Knee flexion  18.7 22.4 19.2 25.4   Knee extension 4/5 20.5 21.3 30.6 33.1 5/5 39.1, 38.3 lbs  Ankle dorsiflexion      Ankle plantarflexion      Ankle inversion      Ankle eversion       (Blank rows = not tested)                   Today's Treatment       DATE: 11/09/2023 Therex:  Nustep Lvl 6 10 mins UE/LE for ROM, endurance Knee extension machine double leg up, single leg lowering 5 lbs 2 x 10, knee slot for flexion at 4 from back.   Knee extension machine long duration stretch with 5 lbs slot 4 from back Rt leg 3 mins  Knee flexion machine Rt leg only 10 lbs 2 x10  TherActivity Leg press double leg 87 lbs x 15 with flexion to available range, single leg 2 x 15  43 lbs  , performed bilaterally   Manual Seated Rt knee flexion mobilization c movement IR/distraction.   Vaso 10 mins Rt knee in elevation medium compression 34 deg with elevation.  Today's Treatment       DATE: 11/08/2023 Therex:  SciFit UE/LE for 4 min, then LE only for 2 min for knee ROM, seat 11 full range lvl 1.0 Pt questioned bike at fitness center where she lives.  PT explained difference in bike design. Precor seat 5 rocking flexion stretch 5 sec both forward & backward for 3 min.   Standing knee flexion on 18 inch chair against wall BUE support on chair backs with lean 10 sec x 3 reps 3 sets moving LLE stance 1-2 closer each set   Seated Rt leg LAQ and  contralateral LE active knee flexion with end range pauses in flexion and extension x 15 reps Supine AA knee flexion (towel holding femur vertical) gravity assisted knee flexion 15 reps.   TherActivity Leg press double leg 87 lbs x 15 with flexion to available range, single leg 2 x 15  43 lbs  , performed bilaterally  Step up & step down WB on Rt leg 6 inch step with BUE // bars x 10   Manual Seated Rt knee flexion mobilization c movement IR/distraction.   Vaso 10 mins Rt knee in elevation medium compression 34 deg with elevation.    Treatment       DATE: 11/07/2023 Therex: UBE UE/LE for knee ROM, seat 11 full range lvl 1.0 8 mins.  Standing knee flexion on 6 inch step lean 15 sec x 6 Seated Rt leg LAQ with end range pauses in flexion and extension x 15  Gait Training Continued education and cues for sequencing of ambulation with cane in Lt hand.    TherActivity Leg press double leg 75 lbs x 15 with flexion to available range(increase weight next visit) , single leg 2 x 15  37 lbs  , performed bilaterally  Step up WB on Rt leg 6 inch step with Lt hand on bar x 10   Manual Seated Rt knee flexion mobilization c movement IR/distraction.    PATIENT EDUCATION:  Education details: HEP Person educated: Patient Education method: Programmer, multimedia, Facilities manager, and Handouts Education comprehension: verbalized understanding, returned demonstration, and needs further education  HOME EXERCISE PROGRAM: Access Code: [email or web address] URL: [email or web address] Date: 10/28/2023 Prepared by: Lamar Ivory  Exercises - Ankle Alphabet in Elevation  - 10 x daily - 7 x weekly - 1 sets - 10 reps - Quad Set  - 5-10 x daily - 7 x weekly - 1 sets - 5-10 reps - 5 sec hold - Seated Knee Flexion Extension AROM   - 5-10 x daily - 7 x weekly - 1 sets - 5-10 reps - 5 sec  hold - Supine Bridge  - 2 x daily - 7 x weekly - 2 sets - 10 reps - 3 seconds hold - Sit to Stand with Counter Support  - 2 x  daily - 7 x weekly - 2 sets - 10 reps - Seated Heel Slide  - 2 x daily - 7 x weekly - 2 sets - 10 reps - Seated Knee Flexion AAROM  - 10 x daily - 7 x weekly - 1 sets - 10 reps - 10 second hold   ASSESSMENT:  CLINICAL IMPRESSION: The patient has  attended 24 visits over the course of treatment cycle.  Patient has reported overall improvement at 60%.  See objective data above for updated information regarding current presentation.  Pt has continued to struggle with progression of Lt knee flexion with small gains noted over time but still limited < 90 degrees.  Strength has continued to show improvement.  Ambulation at this time with [facility] primarily in community.  Pt has attended at higher frequency in time since manipulation.  At this time, plan for reduced frequency with goals of ultimate HEP transitioning when appropriate.    OBJECTIVE IMPAIRMENTS: Abnormal gait, decreased balance, decreased endurance, decreased knowledge of use of DME, decreased mobility, difficulty walking, decreased ROM, decreased strength, hypomobility, increased edema, increased fascial restrictions, increased muscle spasms, and pain.    GOALS: Goals reviewed with patient? Yes  SHORT TERM GOALS: Target date: 10/06/2023  Independent with initial HEP Goal status: MET 10/03/23  2.  Rt knee ROM improved to 0-90 for improved mobility Goal status: Partially met 10/28/2023   LONG TERM GOALS: Target date: 12/08/2023  Independent with final HEP Goal status: revised on date 11/09/2023  2.  PSFS score improved by 3 points Goal status: MET 10/11/23  3.  Rt knee AROM improved to 0-100 for improved function and mobility Goal status:  revised on date 11/09/2023  4.  Report pain < 4/10 with standing and walking for improved function Goal status: revised on date 11/09/2023  5.  Amb without AD without significant deviations for improved community access Goal status: revised on date 11/09/2023    PLAN:  PT FREQUENCY:  1-2x/week  PT DURATION: 4 additional weeks  PLANNED INTERVENTIONS: 97164- PT Re-evaluation, 97750- Physical Performance Testing, 97110-Therapeutic exercises, 97530- Therapeutic activity, 97112- Neuromuscular re-education, 97535- Self Care, [id number]- Manual therapy, [phone]- Gait training, [phone]- Aquatic Therapy, [phone]- Electrical stimulation (unattended), 97016- Vasopneumatic device, Patient/Family education, Balance training, Stair training, Taping, Dry Needling, Joint mobilization, DME instructions, Cryotherapy, and Moist heat.  PLAN FOR NEXT SESSION: Has follow up after next scheduled visit.  Plan for HEP transitioning prep as able with reduced frequency compared to the week after manip.   Ozell Silvan, PT, DPT, OCS, ATC 11/09/23  10:11 AM

## 2023-11-10 ENCOUNTER — Ambulatory Visit (INDEPENDENT_AMBULATORY_CARE_PROVIDER_SITE_OTHER): Admitting: Physical Therapy

## 2023-11-10 ENCOUNTER — Encounter: Payer: Self-pay | Admitting: Physician Assistant

## 2023-11-10 ENCOUNTER — Ambulatory Visit: Admitting: Physician Assistant

## 2023-11-10 ENCOUNTER — Encounter: Payer: Self-pay | Admitting: Physical Therapy

## 2023-11-10 DIAGNOSIS — M25661 Stiffness of right knee, not elsewhere classified: Secondary | ICD-10-CM | POA: Diagnosis not present

## 2023-11-10 DIAGNOSIS — R2681 Unsteadiness on feet: Secondary | ICD-10-CM

## 2023-11-10 DIAGNOSIS — Z96651 Presence of right artificial knee joint: Secondary | ICD-10-CM

## 2023-11-10 DIAGNOSIS — R2689 Other abnormalities of gait and mobility: Secondary | ICD-10-CM

## 2023-11-10 DIAGNOSIS — M25561 Pain in right knee: Secondary | ICD-10-CM

## 2023-11-10 DIAGNOSIS — M24661 Ankylosis, right knee: Secondary | ICD-10-CM

## 2023-11-10 DIAGNOSIS — R600 Localized edema: Secondary | ICD-10-CM | POA: Diagnosis not present

## 2023-11-10 NOTE — Progress Notes (Signed)
 HPI: Mrs. Raigoza returns today status post right knee manipulation 6-12 25.  She states she is overall doing well.  She is going to therapy feels that her range of motion is doing creasing.  Still taking oxycodone  for pain.  Notes her pain can be severe at times.  Physical exam: General Well-developed well-nourished female in no acute distress. Right knee: Near full extension flexion to 90 degrees.  Keloid formation no signs of infection.  No abnormal warmth or erythema.  Calf supple nontender.  No gross instability valgus varus stressing.  Impression: Status post right total knee arthroplasty 08/26/2023 Status post right knee manipulation under anesthesia 10/27/2023  Plan: Recommend she continue to work on range of motion and strengthening.  Also work on scar tissue then with her incision.  Follow-up with Dr. Jacob in 1 month sooner if there is any questions concerns.  Continue therapy.

## 2023-11-10 NOTE — Therapy (Signed)
 OUTPATIENT PHYSICAL THERAPY TREATMENT Patient Name: Leah Hampton MRN: [id number] DOB:Oct 17, 1955, 68 y.o., female Today's Date: 11/10/2023    END OF SESSION:  PT End of Session - 11/10/23 1132     Visit Number 25    Number of Visits 30    Date for PT Re-Evaluation 12/08/23    Authorization Type [employer] Medicare Dual Complete    Authorization Time Period No authorization needed    Progress Note Due on Visit 30    PT Start Time 1145    PT Stop Time 1235    PT Time Calculation (min) 50 min    Activity Tolerance Patient tolerated treatment well    Behavior During Therapy [facility] for tasks assessed/performed               Past Medical History:  Diagnosis Date   Bronchitis    Chronic bronchitis (HCC)    Cystitis    Hematuria    urologice eval, Dr Alfonzo   History of chest pain    Hyperbilirubinemia    Hyperlipemia    Hypertelorism    Hypertension    Leukocytosis    Obesity    Osteoarthritis    right knee   Pre-diabetes    Sleep apnea 2012   slight   no CPAP   Vaginal Pap smear, abnormal    Vitamin D  deficiency 02/09/2018   Past Surgical History:  Procedure Laterality Date   CERVICAL POLYPECTOMY  07/15/2020   Procedure: POLYPECTOMY with Myosure;  Surgeon: Corene Coy, MD;  Location: [facility] OR;  Service: Gynecology;;   COLONOSCOPY WITH PROPOFOL  N/A 03/27/2015   Procedure: COLONOSCOPY WITH PROPOFOL ;  Surgeon: Toribio SHAUNNA Cedar, MD;  Location: [facility] ENDOSCOPY;  Service: Endoscopy;  Laterality: N/A;   COLONOSCOPY WITH PROPOFOL  N/A 10/02/2020   Procedure: COLONOSCOPY WITH PROPOFOL ;  Surgeon: Cedar Toribio SHAUNNA, MD;  Location: [facility];  Service: Endoscopy;  Laterality: N/A;   COLPOSCOPY N/A 07/15/2020   Procedure: COLPOSCOPY;  Surgeon: Corene Coy, MD;  Location: [facility] OR;  Service: Gynecology;  Laterality: N/A;   DILATATION & CURETTAGE/HYSTEROSCOPY WITH MYOSURE N/A 01/05/2017   Procedure: DILATATION & CURETTAGE/HYSTEROSCOPY WITH MYOSURE;  Surgeon: Timmie Norris, MD;  Location: [facility] ORS;  Service: Gynecology;  Laterality: N/A;  PostMenopausal Bleeding   HYSTEROSCOPY WITH D & C N/A 05/22/2019   Procedure: DILATATION AND CURETTAGE /HYSTEROSCOPY, POLYPECTOMY WITH MYOSURE;  Surgeon: Corene Coy, MD;  Location: [facility] ORS;  Service: Gynecology;  Laterality: N/A;   HYSTEROSCOPY WITH D & C N/A 07/15/2020   Procedure: DILATATION AND CURETTAGE /HYSTEROSCOPY;  Surgeon: Corene Coy, MD;  Location: [facility] OR;  Service: Gynecology;  Laterality: N/A;   KNEE ARTHROSCOPY  04/2007   left knee   KNEE CLOSED REDUCTION Right 10/27/2023   Procedure: MANIPULATION, KNEE, CLOSED;  Surgeon: Vernetta Lonni GRADE, MD;  Location: [facility];  Service: Orthopedics;  Laterality: Right;   LAPAROSCOPIC GASTRIC SLEEVE RESECTION     TOTAL KNEE ARTHROPLASTY Left 10/2008   Dr Sherryn   TOTAL KNEE ARTHROPLASTY Right 08/26/2023   Procedure: ARTHROPLASTY, KNEE, TOTAL;  Surgeon: Vernetta Lonni GRADE, MD;  Location: [facility] OR[facility];  Service: Orthopedics;  Laterality: Right;   UPPER GI ENDOSCOPY N/A 10/14/2020   Procedure: UPPER GI ENDOSCOPY;  Surgeon: Blush Locus, MD;  Location: [facility];  Service: General;  Laterality: N/A;   Patient Active Problem List   Diagnosis Date Noted   Arthrofibrosis of knee joint, right 10/12/2023   Status post total right knee replacement 08/26/2023   Sleep disturbance 01/21/2023  Lactose intolerance 10/22/2022   S/P gastric sleeve procedure 11/04/2020   Uncomplicated asthma 11/04/2020   Gastroesophageal reflux disease 11/04/2020   Primary osteoarthritis of both hips 09/30/2020   History of total left knee replacement 07/29/2020   Endometrial polyp 07/15/2020   Low grade squamous intraepithelial lesion (LGSIL) on cervical Pap smear 07/07/2020   Upper abdominal pain 08/20/2019   Right hip pain 05/28/2019   Post-menopausal bleeding 05/16/2019   Greater trochanteric pain syndrome of left lower extremity 03/20/2019   Type 2 diabetes mellitus  without complication, without long-term current use of insulin  (HCC) 05/18/2018   Vitamin D  deficiency 02/09/2018   Onychomycosis 09/19/2017   Tinea pedis of both feet 09/19/2017   Obstructive chronic bronchitis with exacerbation (HCC) 05/23/2017   Mixed hyperlipidemia 05/01/2014   Genital herpes 05/23/2012   Stress incontinence 03/04/2012   Unilateral primary osteoarthritis, right knee 10/12/2011   CTS (carpal tunnel syndrome) 07/13/2011   OSA (obstructive sleep apnea) 12/10/2010   Severe obesity (BMI >= 40) (HCC) 07/31/2008   Essential hypertension 07/03/2008    PCP: Daryl Setter, NP  REFERRING PROVIDER: Vernetta Lonni GRADE, MD  REFERRING DIAG: M17.11 (ICD-10-CM) - Unilateral primary osteoarthritis, right knee  Rationale for Evaluation and Treatment: Rehabilitation  THERAPY DIAG:  Acute pain of right knee  Stiffness of right knee, not elsewhere classified  Localized edema  Other abnormalities of gait and mobility  Unsteadiness on feet  ONSET DATE: 08/26/23 (DOS)   SUBJECTIVE:                                                                                                                                                                                           SUBJECTIVE STATEMENT: Doing well, a little sore today but not too bad   PERTINENT HISTORY:  besity, HTN, HLD, DM, OA, Lt TKA in 2010, s/p gastric sleeve resection 2022  PAIN:  NPRS scale: at worst 4/10 Pain location: Rt knee Pain description: sore and tight  Aggravating factors: Knee flexion Relieving factors: Prescription pain medication, ice  PRECAUTIONS:  None  RED FLAGS: None   WEIGHT BEARING RESTRICTIONS:  No  FALLS:  Has patient fallen in last 6 months? No  LIVING ENVIRONMENT: Lives with: lives alone Lives in: House/apartment Stairs: Yes: External: 16 steps; can reach both Has following equipment at home: Walker - 4 wheeled  OCCUPATION:  On disability  PLOF:  Independent  and Leisure: watching TV, shopping, has a fitness center at her apartment complex  PATIENT GOALS:  Improve function in knee, get back to gym  OBJECTIVE:  Note: Objective measures were completed at Evaluation unless otherwise noted.  PATIENT SURVEYS:  Patient-Specific Activity Scoring Scheme  0 represents "unable to perform." 10 represents "able to perform at prior level. 0 1 2 3 4 5 6 7 8 9  10 (Date and Score)   Activity Eval  10/11/23  11/09/2023  1. Walking 2 5  7   2. Bending the knee(sitting, transfers) 2  5 5   3. Extending the knee 2 8 6   Score 2 6 6    Total score = sum of the activity scores/number of activities Minimum detectable change (90%CI) for average score = 2 points Minimum detectable change (90%CI) for single activity score = 3 points  COGNITIVE STATUS: Within functional limits for tasks assessed   SENSATION: WFL  GAIT: 09/08/23 Distance walked: 150' within clinic Assistive device utilized: Environmental consultant - 4 wheeled Level of assistance: Modified independence Comments: decreased stance on Rt, decr hip/knee flexion on Rt   EDEMA: 09/19/23: knee joint line: Rt: 59.5 centimeters  09/08/23  Knee joint line: Rt: 71 cm/Lt: 48.5 cm    LOWER EXTREMITY ROM:     ROM Right eval Left eval Rt 09/13/23 Rt 09/19/23 Right 09/21/23 Right  09/30/23 Rt 10/03/23 Rt 10/11/23 Rt 10/17/23 Right  10/28/23 Right 10/31/23 Right 11/04/23 Right 11/08/23 Right 11/10/23  Knee flexion A: 21 P: 41 A: 108 A: 48 P: 60 A: 50 P: 60 P: 71* P 56* AA: 50 P: 58 AA:58 P: 60 AA: 60 AAROM 92 Supine AROM heel slide:  82  Supine AAROM heel slide: 92    Supine AROM heel slide 72*, AAROM 84* Seated P: 84*  Supine  AA: 86* A: 81  (Supine)  Knee extension A: 8 (seated LAQ) P: 0 A: 0 A: 10 P: 8 A: 10 P: 6   A: -7 P: -5 A: -5 P: -2 A: -5 -5       (Blank rows = not tested)   LOWER EXTREMITY MMT:   09/08/2023 Not formally tested at eval; Rt knee grossly 3-/5  MMT Right 09/28/2023  Rt 10/03/23 Seated HHD 10/17/23 Rt Seated  HHD Right 11/09/2023  Knee flexion  18.7 22.4 19.2 25.4   Knee extension 4/5 20.5 21.3 30.6 33.1 5/5 39.1, 38.3 lbs   (Blank rows = not tested)                   Today's Treatment 11/10/23 TherEx NuStep L6 x 10 min; UE/LEs for ROM/endurance Knee extension 5# 3x10; bil concentric; RLE only eccentric with flexion holds between sets x 30 sec Hamstring curl 15# 3x10 bil ROM measurements - see above for details  Neuro Re Ed Side stepping on foam x 5 laps with light UE support Tandem walking on foam x 5 laps with UE support needed  TherAct Leg press double leg 87# 2x10 with flexion to available range, RLE only 37# 2x10  Modalities Vaso x 10 min to Rt knee in elevation; mod pressure; 34 deg    11/09/2023 Therex:  Nustep Lvl 6 10 mins UE/LE for ROM, endurance Knee extension machine double leg up, single leg lowering 5 lbs 2 x 10, knee slot for flexion at 4 from back.   Knee extension machine long duration stretch with 5 lbs slot 4 from back Rt leg 3 mins  Knee flexion machine Rt leg only 10 lbs 2 x10   TherActivity Leg press double leg 87 lbs x 15 with flexion to available range, single leg 2 x 15  43 lbs  , performed bilaterally   Manual Seated Rt knee flexion mobilization c movement  IR/distraction.   Vaso 10 mins Rt knee in elevation medium compression 34 deg with elevation.  11/08/2023 Therex:  SciFit UE/LE for 4 min, then LE only for 2 min for knee ROM, seat 11 full range lvl 1.0 Pt questioned bike at fitness center where she lives.  PT explained difference in bike design. Precor seat 5 rocking flexion stretch 5 sec both forward & backward for 3 min.   Standing knee flexion on 18 inch chair against wall BUE support on chair backs with lean 10 sec x 3 reps 3 sets moving LLE stance 1-2 closer each set   Seated Rt leg LAQ and contralateral LE active knee flexion with end range pauses in flexion and extension x 15 reps Supine  AA knee flexion (towel holding femur vertical) gravity assisted knee flexion 15 reps.   TherActivity Leg press double leg 87 lbs x 15 with flexion to available range, single leg 2 x 15  43 lbs  , performed bilaterally  Step up & step down WB on Rt leg 6 inch step with BUE // bars x 10   Manual Seated Rt knee flexion mobilization c movement IR/distraction.   Vaso 10 mins Rt knee in elevation medium compression 34 deg with elevation.    11/07/2023 Therex: UBE UE/LE for knee ROM, seat 11 full range lvl 1.0 8 mins.  Standing knee flexion on 6 inch step lean 15 sec x 6 Seated Rt leg LAQ with end range pauses in flexion and extension x 15  Gait Training Continued education and cues for sequencing of ambulation with cane in Lt hand.    TherActivity Leg press double leg 75 lbs x 15 with flexion to available range(increase weight next visit) , single leg 2 x 15  37 lbs  , performed bilaterally  Step up WB on Rt leg 6 inch step with Lt hand on bar x 10   Manual Seated Rt knee flexion mobilization c movement IR/distraction.    PATIENT EDUCATION:  Education details: HEP Person educated: Patient Education method: Programmer, multimedia, Facilities manager, and Handouts Education comprehension: verbalized understanding, returned demonstration, and needs further education  HOME EXERCISE PROGRAM: Access Code: [email or web address] URL: [email or web address] Date: 10/28/2023 Prepared by: Lamar Ivory  Exercises - Ankle Alphabet in Elevation  - 10 x daily - 7 x weekly - 1 sets - 10 reps - Quad Set  - 5-10 x daily - 7 x weekly - 1 sets - 5-10 reps - 5 sec hold - Seated Knee Flexion Extension AROM   - 5-10 x daily - 7 x weekly - 1 sets - 5-10 reps - 5 sec  hold - Supine Bridge  - 2 x daily - 7 x weekly - 2 sets - 10 reps - 3 seconds hold - Sit to Stand with Counter Support  - 2 x daily - 7 x weekly - 2 sets - 10 reps - Seated Heel Slide  - 2 x daily - 7 x weekly - 2 sets - 10 reps - Seated Knee Flexion  AAROM  - 10 x daily - 7 x weekly - 1 sets - 10 reps - 10 second hold   ASSESSMENT:  CLINICAL IMPRESSION: Pt tolerated session well today and feel we are nearing transition to HEP and d/c from PT over the next few weeks.  Functionally progressing with PT, still with limitations with ROM.  Continue skilled PT.     OBJECTIVE IMPAIRMENTS: Abnormal gait, decreased balance, decreased endurance, decreased knowledge of use of DME, decreased mobility,  difficulty walking, decreased ROM, decreased strength, hypomobility, increased edema, increased fascial restrictions, increased muscle spasms, and pain.    GOALS: Goals reviewed with patient? Yes  SHORT TERM GOALS: Target date: 10/06/2023  Independent with initial HEP Goal status: MET 10/03/23  2.  Rt knee ROM improved to 0-90 for improved mobility Goal status: Partially met 10/28/2023   LONG TERM GOALS: Target date: 12/08/2023  Independent with final HEP Goal status: revised on date 11/09/2023  2.  PSFS score improved by 3 points Goal status: MET 10/11/23  3.  Rt knee AROM improved to 0-100 for improved function and mobility Goal status:  revised on date 11/09/2023  4.  Report pain < 4/10 with standing and walking for improved function Goal status: revised on date 11/09/2023  5.  Amb without AD without significant deviations for improved community access Goal status: revised on date 11/09/2023    PLAN:  PT FREQUENCY: 1-2x/week  PT DURATION: 4 additional weeks  PLANNED INTERVENTIONS: 97164- PT Re-evaluation, 97750- Physical Performance Testing, 97110-Therapeutic exercises, 97530- Therapeutic activity, 97112- Neuromuscular re-education, 97535- Self Care, [id number]- Manual therapy, [phone]- Gait training, [phone]- Aquatic Therapy, [phone]- Electrical stimulation (unattended), 97016- Vasopneumatic device, Patient/Family education, Balance training, Stair training, Taping, Dry Needling, Joint mobilization, DME instructions, Cryotherapy, and Moist  heat.  PLAN FOR NEXT SESSION: what did MD say.  Plan for HEP transitioning prep as able with reduced frequency compared to the week after manip.   Corean JULIANNA Ku, PT, DPT 11/10/23 12:38 PM

## 2023-11-11 ENCOUNTER — Encounter: Payer: Self-pay | Admitting: Physical Therapy

## 2023-11-11 ENCOUNTER — Ambulatory Visit (INDEPENDENT_AMBULATORY_CARE_PROVIDER_SITE_OTHER): Admitting: Physical Therapy

## 2023-11-11 DIAGNOSIS — R600 Localized edema: Secondary | ICD-10-CM | POA: Diagnosis not present

## 2023-11-11 DIAGNOSIS — M25561 Pain in right knee: Secondary | ICD-10-CM | POA: Diagnosis not present

## 2023-11-11 DIAGNOSIS — R2689 Other abnormalities of gait and mobility: Secondary | ICD-10-CM | POA: Diagnosis not present

## 2023-11-11 DIAGNOSIS — R2681 Unsteadiness on feet: Secondary | ICD-10-CM

## 2023-11-11 DIAGNOSIS — M25661 Stiffness of right knee, not elsewhere classified: Secondary | ICD-10-CM | POA: Diagnosis not present

## 2023-11-11 NOTE — Therapy (Signed)
 OUTPATIENT PHYSICAL THERAPY TREATMENT Patient Name: Leah Hampton MRN: [id number] DOB:31-Jan-1956, 68 y.o., female Today's Date: 11/11/2023    END OF SESSION:  PT End of Session - 11/11/23 1046     Visit Number 26    Number of Visits 30    Date for PT Re-Evaluation 12/08/23    Authorization Type [employer] Medicare Dual Complete    Authorization Time Period No authorization needed    Progress Note Due on Visit 30    PT Start Time 1050    PT Stop Time 1130    PT Time Calculation (min) 40 min    Activity Tolerance Patient tolerated treatment well    Behavior During Therapy [facility] for tasks assessed/performed           Past Medical History:  Diagnosis Date   Bronchitis    Chronic bronchitis (HCC)    Cystitis    Hematuria    urologice eval, Dr Alfonzo   History of chest pain    Hyperbilirubinemia    Hyperlipemia    Hypertelorism    Hypertension    Leukocytosis    Obesity    Osteoarthritis    right knee   Pre-diabetes    Sleep apnea 2012   slight   no CPAP   Vaginal Pap smear, abnormal    Vitamin D  deficiency 02/09/2018   Past Surgical History:  Procedure Laterality Date   CERVICAL POLYPECTOMY  07/15/2020   Procedure: POLYPECTOMY with Myosure;  Surgeon: Corene Coy, MD;  Location: [facility] OR;  Service: Gynecology;;   COLONOSCOPY WITH PROPOFOL  N/A 03/27/2015   Procedure: COLONOSCOPY WITH PROPOFOL ;  Surgeon: Toribio SHAUNNA Cedar, MD;  Location: [facility];  Service: Endoscopy;  Laterality: N/A;   COLONOSCOPY WITH PROPOFOL  N/A 10/02/2020   Procedure: COLONOSCOPY WITH PROPOFOL ;  Surgeon: Cedar Toribio SHAUNNA, MD;  Location: [facility];  Service: Endoscopy;  Laterality: N/A;   COLPOSCOPY N/A 07/15/2020   Procedure: COLPOSCOPY;  Surgeon: Corene Coy, MD;  Location: [facility] OR;  Service: Gynecology;  Laterality: N/A;   DILATATION & CURETTAGE/HYSTEROSCOPY WITH MYOSURE N/A 01/05/2017   Procedure: DILATATION & CURETTAGE/HYSTEROSCOPY WITH MYOSURE;  Surgeon: Timmie Norris,  MD;  Location: [facility] ORS;  Service: Gynecology;  Laterality: N/A;  PostMenopausal Bleeding   HYSTEROSCOPY WITH D & C N/A 05/22/2019   Procedure: DILATATION AND CURETTAGE /HYSTEROSCOPY, POLYPECTOMY WITH MYOSURE;  Surgeon: Corene Coy, MD;  Location: [facility];  Service: Gynecology;  Laterality: N/A;   HYSTEROSCOPY WITH D & C N/A 07/15/2020   Procedure: DILATATION AND CURETTAGE /HYSTEROSCOPY;  Surgeon: Corene Coy, MD;  Location: [facility] OR;  Service: Gynecology;  Laterality: N/A;   KNEE ARTHROSCOPY  04/2007   left knee   KNEE CLOSED REDUCTION Right 10/27/2023   Procedure: MANIPULATION, KNEE, CLOSED;  Surgeon: Vernetta Lonni GRADE, MD;  Location: [facility];  Service: Orthopedics;  Laterality: Right;   LAPAROSCOPIC GASTRIC SLEEVE RESECTION     TOTAL KNEE ARTHROPLASTY Left 10/2008   Dr Sherryn   TOTAL KNEE ARTHROPLASTY Right 08/26/2023   Procedure: ARTHROPLASTY, KNEE, TOTAL;  Surgeon: Vernetta Lonni GRADE, MD;  Location: [facility];  Service: Orthopedics;  Laterality: Right;   UPPER GI ENDOSCOPY N/A 10/14/2020   Procedure: UPPER GI ENDOSCOPY;  Surgeon: Blush Locus, MD;  Location: [facility];  Service: General;  Laterality: N/A;   Patient Active Problem List   Diagnosis Date Noted   Arthrofibrosis of knee joint, right 10/12/2023   Status post total right knee replacement 08/26/2023   Sleep disturbance 01/21/2023   Lactose intolerance 10/22/2022  S/P gastric sleeve procedure 11/04/2020   Uncomplicated asthma 11/04/2020   Gastroesophageal reflux disease 11/04/2020   Primary osteoarthritis of both hips 09/30/2020   History of total left knee replacement 07/29/2020   Endometrial polyp 07/15/2020   Low grade squamous intraepithelial lesion (LGSIL) on cervical Pap smear 07/07/2020   Upper abdominal pain 08/20/2019   Right hip pain 05/28/2019   Post-menopausal bleeding 05/16/2019   Greater trochanteric pain syndrome of left lower extremity 03/20/2019   Type 2 diabetes mellitus without  complication, without long-term current use of insulin  (HCC) 05/18/2018   Vitamin D  deficiency 02/09/2018   Onychomycosis 09/19/2017   Tinea pedis of both feet 09/19/2017   Obstructive chronic bronchitis with exacerbation (HCC) 05/23/2017   Mixed hyperlipidemia 05/01/2014   Genital herpes 05/23/2012   Stress incontinence 03/04/2012   Unilateral primary osteoarthritis, right knee 10/12/2011   CTS (carpal tunnel syndrome) 07/13/2011   OSA (obstructive sleep apnea) 12/10/2010   Severe obesity (BMI >= 40) (HCC) 07/31/2008   Essential hypertension 07/03/2008    PCP: Daryl Setter, NP  REFERRING PROVIDER: Vernetta Lonni GRADE, MD  REFERRING DIAG: M17.11 (ICD-10-CM) - Unilateral primary osteoarthritis, right knee  Rationale for Evaluation and Treatment: Rehabilitation  THERAPY DIAG:  Acute pain of right knee  Stiffness of right knee, not elsewhere classified  Localized edema  Other abnormalities of gait and mobility  Unsteadiness on feet  ONSET DATE: 08/26/23 (DOS)   SUBJECTIVE:                                                                                                                                                                                           SUBJECTIVE STATEMENT: Pt reports some medial knee soreness today. Pt states she saw PA yesterday but he didn't say much except for her to sit up high and swing her legs.    PERTINENT HISTORY:  besity, HTN, HLD, DM, OA, Lt TKA in 2010, s/p gastric sleeve resection 2022  PAIN:  NPRS scale: at worst 4/10 Pain location: Rt knee Pain description: sore and tight  Aggravating factors: Knee flexion Relieving factors: Prescription pain medication, ice  PRECAUTIONS:  None  RED FLAGS: None   WEIGHT BEARING RESTRICTIONS:  No  FALLS:  Has patient fallen in last 6 months? No  LIVING ENVIRONMENT: Lives with: lives alone Lives in: House/apartment Stairs: Yes: External: 16 steps; can reach both Has  following equipment at home: Walker - 4 wheeled  OCCUPATION:  On disability  PLOF:  Independent and Leisure: watching TV, shopping, has a fitness center at her apartment complex  PATIENT GOALS:  Improve function in knee, get back to gym  OBJECTIVE:  Note: Objective measures were completed at Evaluation unless otherwise noted.  PATIENT SURVEYS:  Patient-Specific Activity Scoring Scheme  0 represents "unable to perform." 10 represents "able to perform at prior level. 0 1 2 3 4 5 6 7 8 9  10 (Date and Score)   Activity Eval  10/11/23  11/09/2023  1. Walking 2 5  7   2. Bending the knee(sitting, transfers) 2  5 5   3. Extending the knee 2 8 6   Score 2 6 6    Total score = sum of the activity scores/number of activities Minimum detectable change (90%CI) for average score = 2 points Minimum detectable change (90%CI) for single activity score = 3 points  COGNITIVE STATUS: Within functional limits for tasks assessed   SENSATION: WFL  GAIT: 09/08/23 Distance walked: 150' within clinic Assistive device utilized: Environmental consultant - 4 wheeled Level of assistance: Modified independence Comments: decreased stance on Rt, decr hip/knee flexion on Rt   EDEMA: 09/19/23: knee joint line: Rt: 59.5 centimeters  09/08/23  Knee joint line: Rt: 71 cm/Lt: 48.5 cm    LOWER EXTREMITY ROM:     ROM Right eval Left eval Rt 09/13/23 Rt 09/19/23 Right 09/21/23 Right  09/30/23 Rt 10/03/23 Rt 10/11/23 Rt 10/17/23 Right  10/28/23 Right 10/31/23 Right 11/04/23 Right 11/08/23 Right 11/10/23  Knee flexion A: 21 P: 41 A: 108 A: 48 P: 60 A: 50 P: 60 P: 71* P 56* AA: 50 P: 58 AA:58 P: 60 AA: 60 AAROM 92 Supine AROM heel slide:  82  Supine AAROM heel slide: 92    Supine AROM heel slide 72*, AAROM 84* Seated P: 84*  Supine  AA: 86* A: 81  (Supine)  Knee extension A: 8 (seated LAQ) P: 0 A: 0 A: 10 P: 8 A: 10 P: 6   A: -7 P: -5 A: -5 P: -2 A: -5 -5       (Blank rows = not tested)   LOWER  EXTREMITY MMT:   09/08/2023 Not formally tested at eval; Rt knee grossly 3-/5  MMT Right 09/28/2023 Rt 10/03/23 Seated HHD 10/17/23 Rt Seated  HHD Right 11/09/2023  Knee flexion  18.7 22.4 19.2 25.4   Knee extension 4/5 20.5 21.3 30.6 33.1 5/5 39.1, 38.3 lbs   (Blank rows = not tested)                   Today's Treatment 11/11/23 TherEx SciFit UE/LE for 5 min fwd, 2 min bwd, seat 11 full range lvl 1.0 Elevated seat knee self flexion 10x10 Elevated seat hamstring curl at end range 2x10x3 Elevated seated LAQ eccentrics 2x10  TherActivity Sit<>stand with knee flexed at 90 deg x10 Slow march 2x10 with UE support  Standing hamstring curl 2x10 with UE support Side step over 2 hurdles x 5 by counter with bilat UE support Fwd step over 2 hurdles x 5 with UE support  Amb and step over hurdles 2x 25'  Modalities Vaso x 10 min to Rt knee in elevation; mod pressure; 34 deg  11/10/23 TherEx NuStep L6 x 10 min; UE/LEs for ROM/endurance Knee extension 5# 3x10; bil concentric; RLE only eccentric with flexion holds between sets x 30 sec Hamstring curl 15# 3x10 bil ROM measurements - see above for details  Neuro Re Ed Side stepping on foam x 5 laps with light UE support Tandem walking on foam x 5 laps with UE support needed  TherAct Leg press double leg 87# 2x10 with flexion to available range, RLE only 37#  2x10  Modalities Vaso x 10 min to Rt knee in elevation; mod pressure; 34 deg    11/09/2023 Therex:  Nustep Lvl 6 10 mins UE/LE for ROM, endurance Knee extension machine double leg up, single leg lowering 5 lbs 2 x 10, knee slot for flexion at 4 from back.   Knee extension machine long duration stretch with 5 lbs slot 4 from back Rt leg 3 mins  Knee flexion machine Rt leg only 10 lbs 2 x10   TherActivity Leg press double leg 87 lbs x 15 with flexion to available range, single leg 2 x 15  43 lbs  , performed bilaterally   Manual Seated Rt knee flexion mobilization c  movement IR/distraction.   Vaso 10 mins Rt knee in elevation medium compression 34 deg with elevation.  11/08/2023 Therex:  SciFit UE/LE for 4 min, then LE only for 2 min for knee ROM, seat 11 full range lvl 1.0 Pt questioned bike at fitness center where she lives.  PT explained difference in bike design. Precor seat 5 rocking flexion stretch 5 sec both forward & backward for 3 min.   Standing knee flexion on 18 inch chair against wall BUE support on chair backs with lean 10 sec x 3 reps 3 sets moving LLE stance 1-2 closer each set   Seated Rt leg LAQ and contralateral LE active knee flexion with end range pauses in flexion and extension x 15 reps Supine AA knee flexion (towel holding femur vertical) gravity assisted knee flexion 15 reps.   TherActivity Leg press double leg 87 lbs x 15 with flexion to available range, single leg 2 x 15  43 lbs  , performed bilaterally  Step up & step down WB on Rt leg 6 inch step with BUE // bars x 10   Manual Seated Rt knee flexion mobilization c movement IR/distraction.   Vaso 10 mins Rt knee in elevation medium compression 34 deg with elevation.     PATIENT EDUCATION:  Education details: HEP Person educated: Patient Education method: Programmer, multimedia, Facilities manager, and Handouts Education comprehension: verbalized understanding, returned demonstration, and needs further education  HOME EXERCISE PROGRAM: Access Code: [email or web address] URL: [email or web address] Date: 11/11/2023 Prepared by: Amely Voorheis April Earnie Starring  Exercises - Quad Set  - 5-10 x daily - 7 x weekly - 1 sets - 5-10 reps - 5 sec hold - Supine Bridge  - 2 x daily - 7 x weekly - 2 sets - 10 reps - 3 seconds hold - Seated Knee Flexion Extension AROM   - 5-10 x daily - 7 x weekly - 1 sets - 5-10 reps - 5 sec  hold - Seated Heel Slide  - 2 x daily - 7 x weekly - 2 sets - 10 reps - Seated Knee Flexion AAROM  - 10 x daily - 7 x weekly - 1 sets - 10 reps - 10 second hold - Sit to  Stand with Armchair  - 1 x daily - 7 x weekly - 2 sets - 10 reps - Standing Marching  - 1 x daily - 7 x weekly - 2 sets - 10 reps - Standing Knee Flexion AROM with Chair Support  - 1 x daily - 7 x weekly - 2 sets - 10 reps   ASSESSMENT:  CLINICAL IMPRESSION: Continued work on encouraging pt to utilize more of her knee flexion for sit<>stand transfers and stepping up/over obstacles. Working on improving R LE weightbearing and weight shifting. Updated pt's HEP   OBJECTIVE  IMPAIRMENTS: Abnormal gait, decreased balance, decreased endurance, decreased knowledge of use of DME, decreased mobility, difficulty walking, decreased ROM, decreased strength, hypomobility, increased edema, increased fascial restrictions, increased muscle spasms, and pain.    GOALS: Goals reviewed with patient? Yes  SHORT TERM GOALS: Target date: 10/06/2023  Independent with initial HEP Goal status: MET 10/03/23  2.  Rt knee ROM improved to 0-90 for improved mobility Goal status: Partially met 10/28/2023   LONG TERM GOALS: Target date: 12/08/2023  Independent with final HEP Goal status: revised on date 11/09/2023  2.  PSFS score improved by 3 points Goal status: MET 10/11/23  3.  Rt knee AROM improved to 0-100 for improved function and mobility Goal status:  revised on date 11/09/2023  4.  Report pain < 4/10 with standing and walking for improved function Goal status: revised on date 11/09/2023  5.  Amb without AD without significant deviations for improved community access Goal status: revised on date 11/09/2023    PLAN:  PT FREQUENCY: 1-2x/week  PT DURATION: 4 additional weeks  PLANNED INTERVENTIONS: 97164- PT Re-evaluation, 97750- Physical Performance Testing, 97110-Therapeutic exercises, 97530- Therapeutic activity, 97112- Neuromuscular re-education, 97535- Self Care, [id number]- Manual therapy, [phone]- Gait training, [phone]- Aquatic Therapy, [phone]- Electrical stimulation (unattended), 97016- Vasopneumatic  device, Patient/Family education, Balance training, Stair training, Taping, Dry Needling, Joint mobilization, DME instructions, Cryotherapy, and Moist heat.  PLAN FOR NEXT SESSION:  Plan for HEP transitioning prep as able with reduced frequency compared to the week after manip.   Trent Gabler April Ma L Maisen Klingler, PT, DPT 11/11/23 10:47 AM

## 2023-11-14 DIAGNOSIS — E119 Type 2 diabetes mellitus without complications: Secondary | ICD-10-CM | POA: Diagnosis not present

## 2023-11-14 DIAGNOSIS — H43812 Vitreous degeneration, left eye: Secondary | ICD-10-CM | POA: Diagnosis not present

## 2023-11-14 DIAGNOSIS — H2513 Age-related nuclear cataract, bilateral: Secondary | ICD-10-CM | POA: Diagnosis not present

## 2023-11-14 LAB — HM DIABETES EYE EXAM

## 2023-11-16 ENCOUNTER — Ambulatory Visit: Admitting: Orthopaedic Surgery

## 2023-11-17 ENCOUNTER — Ambulatory Visit: Admitting: Physical Therapy

## 2023-11-17 ENCOUNTER — Encounter: Payer: Self-pay | Admitting: Physical Therapy

## 2023-11-17 DIAGNOSIS — M25561 Pain in right knee: Secondary | ICD-10-CM

## 2023-11-17 DIAGNOSIS — R600 Localized edema: Secondary | ICD-10-CM

## 2023-11-17 DIAGNOSIS — R2689 Other abnormalities of gait and mobility: Secondary | ICD-10-CM | POA: Diagnosis not present

## 2023-11-17 DIAGNOSIS — M25661 Stiffness of right knee, not elsewhere classified: Secondary | ICD-10-CM

## 2023-11-17 DIAGNOSIS — R2681 Unsteadiness on feet: Secondary | ICD-10-CM

## 2023-11-17 NOTE — Therapy (Signed)
 OUTPATIENT PHYSICAL THERAPY TREATMENT Patient Name: Leah Hampton MRN: [id number] DOB:1955/12/03, 68 y.o., female Today's Date: 11/17/2023    END OF SESSION:  PT End of Session - 11/17/23 1135     Visit Number 27    Number of Visits 30    Date for PT Re-Evaluation 12/08/23    Authorization Type [employer] Medicare Dual Complete    Authorization Time Period No authorization needed    Progress Note Due on Visit 30    PT Start Time 1135    PT Stop Time 1217    PT Time Calculation (min) 42 min    Activity Tolerance Patient tolerated treatment well    Behavior During Therapy [facility] for tasks assessed/performed            Past Medical History:  Diagnosis Date   Bronchitis    Chronic bronchitis (HCC)    Cystitis    Hematuria    urologice eval, Dr Alfonzo   History of chest pain    Hyperbilirubinemia    Hyperlipemia    Hypertelorism    Hypertension    Leukocytosis    Obesity    Osteoarthritis    right knee   Pre-diabetes    Sleep apnea 2012   slight   no CPAP   Vaginal Pap smear, abnormal    Vitamin D  deficiency 02/09/2018   Past Surgical History:  Procedure Laterality Date   CERVICAL POLYPECTOMY  07/15/2020   Procedure: POLYPECTOMY with Myosure;  Surgeon: Corene Coy, MD;  [facility];  Service: Gynecology;;   COLONOSCOPY WITH PROPOFOL  N/A 03/27/2015   Procedure: COLONOSCOPY WITH PROPOFOL ;  Surgeon: Toribio SHAUNNA Cedar, MD;  Location: [facility] ENDOSCOPY;  Service: Endoscopy;  Laterality: N/A;   COLONOSCOPY WITH PROPOFOL  N/A 10/02/2020   Procedure: COLONOSCOPY WITH PROPOFOL ;  Surgeon: Cedar Toribio SHAUNNA, MD;  Location: [facility];  Service: Endoscopy;  Laterality: N/A;   COLPOSCOPY N/A 07/15/2020   Procedure: COLPOSCOPY;  Surgeon: Corene Coy, MD;  Location: [facility] OR;  Service: Gynecology;  Laterality: N/A;   DILATATION & CURETTAGE/HYSTEROSCOPY WITH MYOSURE N/A 01/05/2017   Procedure: DILATATION & CURETTAGE/HYSTEROSCOPY WITH MYOSURE;  Surgeon: Timmie Norris, MD;  Location: [facility] ORS;  Service: Gynecology;  Laterality: N/A;  PostMenopausal Bleeding   HYSTEROSCOPY WITH D & C N/A 05/22/2019   Procedure: DILATATION AND CURETTAGE /HYSTEROSCOPY, POLYPECTOMY WITH MYOSURE;  Surgeon: Corene Coy, MD;  Location: [facility] ORS;  Service: Gynecology;  Laterality: N/A;   HYSTEROSCOPY WITH D & C N/A 07/15/2020   Procedure: DILATATION AND CURETTAGE /HYSTEROSCOPY;  Surgeon: Corene Coy, MD;  Location: [facility] OR;  Service: Gynecology;  Laterality: N/A;   KNEE ARTHROSCOPY  04/2007   left knee   KNEE CLOSED REDUCTION Right 10/27/2023   Procedure: MANIPULATION, KNEE, CLOSED;  Surgeon: Vernetta Lonni GRADE, MD;  Location: [facility] ORS;  Service: Orthopedics;  Laterality: Right;   LAPAROSCOPIC GASTRIC SLEEVE RESECTION     TOTAL KNEE ARTHROPLASTY Left 10/2008   Dr Sherryn   TOTAL KNEE ARTHROPLASTY Right 08/26/2023   Procedure: ARTHROPLASTY, KNEE, TOTAL;  Surgeon: Vernetta Lonni GRADE, MD;  Location: [facility] ORS;  Service: Orthopedics;  Laterality: Right;   UPPER GI ENDOSCOPY N/A 10/14/2020   Procedure: UPPER GI ENDOSCOPY;  Surgeon: Blush Locus, MD;  Location: [facility] ORS;  Service: General;  Laterality: N/A;   Patient Active Problem List   Diagnosis Date Noted   Arthrofibrosis of knee joint, right 10/12/2023   Status post total right knee replacement 08/26/2023   Sleep disturbance 01/21/2023   Lactose intolerance  10/22/2022   S/P gastric sleeve procedure 11/04/2020   Uncomplicated asthma 11/04/2020   Gastroesophageal reflux disease 11/04/2020   Primary osteoarthritis of both hips 09/30/2020   History of total left knee replacement 07/29/2020   Endometrial polyp 07/15/2020   Low grade squamous intraepithelial lesion (LGSIL) on cervical Pap smear 07/07/2020   Upper abdominal pain 08/20/2019   Right hip pain 05/28/2019   Post-menopausal bleeding 05/16/2019   Greater trochanteric pain syndrome of left lower extremity 03/20/2019   Type 2 diabetes mellitus  without complication, without long-term current use of insulin  (HCC) 05/18/2018   Vitamin D  deficiency 02/09/2018   Onychomycosis 09/19/2017   Tinea pedis of both feet 09/19/2017   Obstructive chronic bronchitis with exacerbation (HCC) 05/23/2017   Mixed hyperlipidemia 05/01/2014   Genital herpes 05/23/2012   Stress incontinence 03/04/2012   Unilateral primary osteoarthritis, right knee 10/12/2011   CTS (carpal tunnel syndrome) 07/13/2011   OSA (obstructive sleep apnea) 12/10/2010   Severe obesity (BMI >= 40) (HCC) 07/31/2008   Essential hypertension 07/03/2008    PCP: Daryl Setter, NP  REFERRING PROVIDER: Vernetta Lonni GRADE, MD  REFERRING DIAG: M17.11 (ICD-10-CM) - Unilateral primary osteoarthritis, right knee  Rationale for Evaluation and Treatment: Rehabilitation  THERAPY DIAG:  Acute pain of right knee  Stiffness of right knee, not elsewhere classified  Localized edema  Other abnormalities of gait and mobility  Unsteadiness on feet  ONSET DATE: 08/26/23 (DOS)   SUBJECTIVE:                                                                                                                                                                                           SUBJECTIVE STATEMENT: Doing well, no complaints  PERTINENT HISTORY:  besity, HTN, HLD, DM, OA, Lt TKA in 2010, s/p gastric sleeve resection 2022  PAIN:  NPRS scale: at worst 4/10 Pain location: Rt knee Pain description: sore and tight  Aggravating factors: Knee flexion Relieving factors: Prescription pain medication, ice  PRECAUTIONS:  None  RED FLAGS: None   WEIGHT BEARING RESTRICTIONS:  No  FALLS:  Has patient fallen in last 6 months? No  LIVING ENVIRONMENT: Lives with: lives alone Lives in: House/apartment Stairs: Yes: External: 16 steps; can reach both Has following equipment at home: Walker - 4 wheeled  OCCUPATION:  On disability  PLOF:  Independent and Leisure: watching  TV, shopping, has a fitness center at her apartment complex  PATIENT GOALS:  Improve function in knee, get back to gym  OBJECTIVE:  Note: Objective measures were completed at Evaluation unless otherwise noted.  PATIENT SURVEYS:  Patient-Specific Activity Scoring Scheme  0 represents "unable to  perform." 10 represents "able to perform at prior level. 0 1 2 3 4 5 6 7 8 9  10 (Date and Score)   Activity Eval  10/11/23  11/09/2023  1. Walking 2 5  7   2. Bending the knee(sitting, transfers) 2  5 5   3. Extending the knee 2 8 6   Score 2 6 6    Total score = sum of the activity scores/number of activities Minimum detectable change (90%CI) for average score = 2 points Minimum detectable change (90%CI) for single activity score = 3 points  COGNITIVE STATUS: Within functional limits for tasks assessed   SENSATION: WFL  GAIT: 09/08/23 Distance walked: 150' within clinic Assistive device utilized: Environmental consultant - 4 wheeled Level of assistance: Modified independence Comments: decreased stance on Rt, decr hip/knee flexion on Rt   EDEMA: 09/19/23: knee joint line: Rt: 59.5 centimeters  09/08/23  Knee joint line: Rt: 71 cm/Lt: 48.5 cm    LOWER EXTREMITY ROM:     ROM Right eval Left eval Rt 09/13/23 Rt 09/19/23 Right 09/21/23 Right  09/30/23 Rt 10/03/23 Rt 10/11/23 Rt 10/17/23 Right  10/28/23 Right 10/31/23 Right 11/04/23 Right 11/08/23 Right 11/10/23  Knee flexion A: 21 P: 41 A: 108 A: 48 P: 60 A: 50 P: 60 P: 71* P 56* AA: 50 P: 58 AA:58 P: 60 AA: 60 AAROM 92 Supine AROM heel slide:  82  Supine AAROM heel slide: 92    Supine AROM heel slide 72*, AAROM 84* Seated P: 84*  Supine  AA: 86* A: 81  (Supine)  Knee extension A: 8 (seated LAQ) P: 0 A: 0 A: 10 P: 8 A: 10 P: 6   A: -7 P: -5 A: -5 P: -2 A: -5 -5       (Blank rows = not tested)   LOWER EXTREMITY MMT:   09/08/2023 Not formally tested at eval; Rt knee grossly 3-/5  MMT Right 09/28/2023 Rt 10/03/23 Seated HHD  10/17/23 Rt Seated  HHD Right 11/09/2023  Knee flexion  18.7 22.4 19.2 25.4   Knee extension 4/5 20.5 21.3 30.6 33.1 5/5 39.1, 38.3 lbs   (Blank rows = not tested)                   Today's Treatment 11/17/23 TherEx NuStep L6 x 10 min for knee flexion and endurance RLE on 6 step with flexion holds 10 x 10 sec hold  TherAct Forward step ups onto 6 block 2x10 bil; cues to decreased UE reliance with stepping Reciprocal LAQ/hamstring curl with L3 band 2x10 with 5 sec hold  Neuro Re-Ed Sidestepping on foam x 5 laps; intermittent UE support needed Tandem walking forward x 5 laps on foam; UE support needed  Manual Seated Rt knee flexion mobilization with movement to tolerance   11/11/23 TherEx SciFit UE/LE for 5 min fwd, 2 min bwd, seat 11 full range lvl 1.0 Elevated seat knee self flexion 10x10 Elevated seat hamstring curl at end range 2x10x3 Elevated seated LAQ eccentrics 2x10  TherActivity Sit<>stand with knee flexed at 90 deg x10 Slow march 2x10 with UE support  Standing hamstring curl 2x10 with UE support Side step over 2 hurdles x 5 by counter with bilat UE support Fwd step over 2 hurdles x 5 with UE support  Amb and step over hurdles 2x 25'  Modalities Vaso x 10 min to Rt knee in elevation; mod pressure; 34 deg  11/10/23 TherEx NuStep L6 x 10 min; UE/LEs for ROM/endurance Knee extension 5# 3x10; bil  concentric; RLE only eccentric with flexion holds between sets x 30 sec Hamstring curl 15# 3x10 bil ROM measurements - see above for details  Neuro Re Ed Side stepping on foam x 5 laps with light UE support Tandem walking on foam x 5 laps with UE support needed  TherAct Leg press double leg 87# 2x10 with flexion to available range, RLE only 37# 2x10  Modalities Vaso x 10 min to Rt knee in elevation; mod pressure; 34 deg    11/09/2023 Therex:  Nustep Lvl 6 10 mins UE/LE for ROM, endurance Knee extension machine double leg up, single leg lowering 5 lbs 2  x 10, knee slot for flexion at 4 from back.   Knee extension machine long duration stretch with 5 lbs slot 4 from back Rt leg 3 mins  Knee flexion machine Rt leg only 10 lbs 2 x10   TherActivity Leg press double leg 87 lbs x 15 with flexion to available range, single leg 2 x 15  43 lbs  , performed bilaterally   Manual Seated Rt knee flexion mobilization c movement IR/distraction.   Vaso 10 mins Rt knee in elevation medium compression 34 deg with elevation.   PATIENT EDUCATION:  Education details: HEP Person educated: Patient Education method: Programmer, multimedia, Facilities manager, and Handouts Education comprehension: verbalized understanding, returned demonstration, and needs further education  HOME EXERCISE PROGRAM: Access Code: [email or web address] URL: [email or web address] Date: 11/11/2023 Prepared by: Gellen April Earnie Starring  Exercises - Quad Set  - 5-10 x daily - 7 x weekly - 1 sets - 5-10 reps - 5 sec hold - Supine Bridge  - 2 x daily - 7 x weekly - 2 sets - 10 reps - 3 seconds hold - Seated Knee Flexion Extension AROM   - 5-10 x daily - 7 x weekly - 1 sets - 5-10 reps - 5 sec  hold - Seated Heel Slide  - 2 x daily - 7 x weekly - 2 sets - 10 reps - Seated Knee Flexion AAROM  - 10 x daily - 7 x weekly - 1 sets - 10 reps - 10 second hold - Sit to Stand with Armchair  - 1 x daily - 7 x weekly - 2 sets - 10 reps - Standing Marching  - 1 x daily - 7 x weekly - 2 sets - 10 reps - Standing Knee Flexion AROM with Chair Support  - 1 x daily - 7 x weekly - 2 sets - 10 reps   ASSESSMENT:  CLINICAL IMPRESSION: Anticipate we are nearing d/c from PT with transition to HEP and community fitness program.  Will continue to benefit from PT to maximize function.   OBJECTIVE IMPAIRMENTS: Abnormal gait, decreased balance, decreased endurance, decreased knowledge of use of DME, decreased mobility, difficulty walking, decreased ROM, decreased strength, hypomobility, increased edema, increased  fascial restrictions, increased muscle spasms, and pain.    GOALS: Goals reviewed with patient? Yes  SHORT TERM GOALS: Target date: 10/06/2023  Independent with initial HEP Goal status: MET 10/03/23  2.  Rt knee ROM improved to 0-90 for improved mobility Goal status: Partially met 10/28/2023   LONG TERM GOALS: Target date: 12/08/2023  Independent with final HEP Goal status: revised on date 11/09/2023  2.  PSFS score improved by 3 points Goal status: MET 10/11/23  3.  Rt knee AROM improved to 0-100 for improved function and mobility Goal status:  revised on date 11/09/2023  4.  Report pain < 4/10 with standing and walking for  improved function Goal status: revised on date 11/09/2023  5.  Amb without AD without significant deviations for improved community access Goal status: revised on date 11/09/2023    PLAN:  PT FREQUENCY: 1-2x/week  PT DURATION: 4 additional weeks  PLANNED INTERVENTIONS: 97164- PT Re-evaluation, 97750- Physical Performance Testing, 97110-Therapeutic exercises, 97530- Therapeutic activity, 97112- Neuromuscular re-education, 97535- Self Care, [id number]- Manual therapy, [phone]- Gait training, [phone]- Aquatic Therapy, [phone]- Electrical stimulation (unattended), 97016- Vasopneumatic device, Patient/Family education, Balance training, Stair training, Taping, Dry Needling, Joint mobilization, DME instructions, Cryotherapy, and Moist heat.  PLAN FOR NEXT SESSION: address functional concerns,   Plan for HEP transitioning prep as able with reduced frequency compared to the week after manip.   Corean JULIANNA Ku, PT, DPT 11/17/23 12:41 PM

## 2023-11-23 ENCOUNTER — Telehealth: Payer: Self-pay

## 2023-11-23 ENCOUNTER — Other Ambulatory Visit: Payer: Self-pay | Admitting: Orthopaedic Surgery

## 2023-11-23 MED ORDER — OXYCODONE HCL 5 MG PO TABS: 5.0000 mg | ORAL_TABLET | Freq: Three times a day (TID) | ORAL | 0 refills | Status: DC | PRN

## 2023-11-23 NOTE — Telephone Encounter (Signed)
 Oxycodone -Acetaminophen  5 MG Immediate Release Capsule   Take 1 tablet (5 mg total) by mouth every 6 (six) hours as needed for moderate pain (pain score 4-6) (pain score 4-6).    PATIENT USES [facility] ON [address]

## 2023-11-25 ENCOUNTER — Ambulatory Visit (INDEPENDENT_AMBULATORY_CARE_PROVIDER_SITE_OTHER): Admitting: Physical Therapy

## 2023-11-25 ENCOUNTER — Encounter: Payer: Self-pay | Admitting: Physical Therapy

## 2023-11-25 DIAGNOSIS — R2689 Other abnormalities of gait and mobility: Secondary | ICD-10-CM

## 2023-11-25 DIAGNOSIS — M25661 Stiffness of right knee, not elsewhere classified: Secondary | ICD-10-CM | POA: Diagnosis not present

## 2023-11-25 DIAGNOSIS — R600 Localized edema: Secondary | ICD-10-CM

## 2023-11-25 DIAGNOSIS — R2681 Unsteadiness on feet: Secondary | ICD-10-CM | POA: Diagnosis not present

## 2023-11-25 DIAGNOSIS — M25561 Pain in right knee: Secondary | ICD-10-CM | POA: Diagnosis not present

## 2023-11-25 NOTE — Therapy (Signed)
 OUTPATIENT PHYSICAL THERAPY TREATMENT Patient Name: Leah Hampton MRN: [id number] DOB:07-13-1955, 68 y.o., female Today's Date: 11/25/2023    END OF SESSION:  PT End of Session - 11/25/23 1151     Visit Number 28    Number of Visits 30    Date for PT Re-Evaluation 12/08/23    Authorization Type [employer] Medicare Dual Complete    Authorization Time Period No authorization needed    Progress Note Due on Visit 30    PT Start Time 1143    PT Stop Time 1227    PT Time Calculation (min) 44 min    Activity Tolerance Patient tolerated treatment well    Behavior During Therapy [facility] for tasks assessed/performed             Past Medical History:  Diagnosis Date   Bronchitis    Chronic bronchitis (HCC)    Cystitis    Hematuria    urologice eval, Dr Alfonzo   History of chest pain    Hyperbilirubinemia    Hyperlipemia    Hypertelorism    Hypertension    Leukocytosis    Obesity    Osteoarthritis    right knee   Pre-diabetes    Sleep apnea 2012   slight   no CPAP   Vaginal Pap smear, abnormal    Vitamin D  deficiency 02/09/2018   Past Surgical History:  Procedure Laterality Date   CERVICAL POLYPECTOMY  07/15/2020   Procedure: POLYPECTOMY with Myosure;  Surgeon: Corene Coy, MD;  Location: [facility] OR;  Service: Gynecology;;   COLONOSCOPY WITH PROPOFOL  N/A 03/27/2015   Procedure: COLONOSCOPY WITH PROPOFOL ;  Surgeon: Toribio SHAUNNA Cedar, MD;  Location: [facility];  Service: Endoscopy;  Laterality: N/A;   COLONOSCOPY WITH PROPOFOL  N/A 10/02/2020   Procedure: COLONOSCOPY WITH PROPOFOL ;  Surgeon: Cedar Toribio SHAUNNA, MD;  Location: [facility];  Service: Endoscopy;  Laterality: N/A;   COLPOSCOPY N/A 07/15/2020   Procedure: COLPOSCOPY;  Surgeon: Corene Coy, MD;  Location: [facility] OR;  Service: Gynecology;  Laterality: N/A;   DILATATION & CURETTAGE/HYSTEROSCOPY WITH MYOSURE N/A 01/05/2017   Procedure: DILATATION & CURETTAGE/HYSTEROSCOPY WITH MYOSURE;  Surgeon: Timmie Norris, MD;  Location: [facility] ORS;  Service: Gynecology;  Laterality: N/A;  PostMenopausal Bleeding   HYSTEROSCOPY WITH D & C N/A 05/22/2019   Procedure: DILATATION AND CURETTAGE /HYSTEROSCOPY, POLYPECTOMY WITH MYOSURE;  Surgeon: Corene Coy, MD;  Location: [facility];  Service: Gynecology;  Laterality: N/A;   HYSTEROSCOPY WITH D & C N/A 07/15/2020   Procedure: DILATATION AND CURETTAGE /HYSTEROSCOPY;  Surgeon: Corene Coy, MD;  Location: [facility] OR;  Service: Gynecology;  Laterality: N/A;   KNEE ARTHROSCOPY  04/2007   left knee   KNEE CLOSED REDUCTION Right 10/27/2023   Procedure: MANIPULATION, KNEE, CLOSED;  Surgeon: Vernetta Lonni GRADE, MD;  Location: [facility];  Service: Orthopedics;  Laterality: Right;   LAPAROSCOPIC GASTRIC SLEEVE RESECTION     TOTAL KNEE ARTHROPLASTY Left 10/2008   Dr Sherryn   TOTAL KNEE ARTHROPLASTY Right 08/26/2023   Procedure: ARTHROPLASTY, KNEE, TOTAL;  Surgeon: Vernetta Lonni GRADE, MD;  Location: [facility];  Service: Orthopedics;  Laterality: Right;   UPPER GI ENDOSCOPY N/A 10/14/2020   Procedure: UPPER GI ENDOSCOPY;  Surgeon: Blush Locus, MD;  Location: [facility];  Service: General;  Laterality: N/A;   Patient Active Problem List   Diagnosis Date Noted   Arthrofibrosis of knee joint, right 10/12/2023   Status post total right knee replacement 08/26/2023   Sleep disturbance 01/21/2023   Lactose  intolerance 10/22/2022   S/P gastric sleeve procedure 11/04/2020   Uncomplicated asthma 11/04/2020   Gastroesophageal reflux disease 11/04/2020   Primary osteoarthritis of both hips 09/30/2020   History of total left knee replacement 07/29/2020   Endometrial polyp 07/15/2020   Low grade squamous intraepithelial lesion (LGSIL) on cervical Pap smear 07/07/2020   Upper abdominal pain 08/20/2019   Right hip pain 05/28/2019   Post-menopausal bleeding 05/16/2019   Greater trochanteric pain syndrome of left lower extremity 03/20/2019   Type 2 diabetes mellitus  without complication, without long-term current use of insulin  (HCC) 05/18/2018   Vitamin D  deficiency 02/09/2018   Onychomycosis 09/19/2017   Tinea pedis of both feet 09/19/2017   Obstructive chronic bronchitis with exacerbation (HCC) 05/23/2017   Mixed hyperlipidemia 05/01/2014   Genital herpes 05/23/2012   Stress incontinence 03/04/2012   Unilateral primary osteoarthritis, right knee 10/12/2011   CTS (carpal tunnel syndrome) 07/13/2011   OSA (obstructive sleep apnea) 12/10/2010   Severe obesity (BMI >= 40) (HCC) 07/31/2008   Essential hypertension 07/03/2008    PCP: Daryl Setter, NP  REFERRING PROVIDER: Vernetta Lonni GRADE, MD  REFERRING DIAG: M17.11 (ICD-10-CM) - Unilateral primary osteoarthritis, right knee  Rationale for Evaluation and Treatment: Rehabilitation  THERAPY DIAG:  Acute pain of right knee  Stiffness of right knee, not elsewhere classified  Localized edema  Other abnormalities of gait and mobility  Unsteadiness on feet  ONSET DATE: 08/26/23 (DOS)   SUBJECTIVE:                                                                                                                                                                                           SUBJECTIVE STATEMENT:  Doing well, would still really like to work on bending my knee   PERTINENT HISTORY:  besity, HTN, HLD, DM, OA, Lt TKA in 2010, s/p gastric sleeve resection 2022  PAIN:  NPRS scale: 4/10 now  Pain location: Rt knee Pain description: sore and tight  Aggravating factors: Knee flexion Relieving factors: Prescription pain medication, ice  PRECAUTIONS:  None  RED FLAGS: None   WEIGHT BEARING RESTRICTIONS:  No  FALLS:  Has patient fallen in last 6 months? No  LIVING ENVIRONMENT: Lives with: lives alone Lives in: House/apartment Stairs: Yes: External: 16 steps; can reach both Has following equipment at home: Walker - 4 wheeled  OCCUPATION:  On disability  PLOF:   Independent and Leisure: watching TV, shopping, has a fitness center at her apartment complex  PATIENT GOALS:  Improve function in knee, get back to gym  OBJECTIVE:  Note: Objective measures were completed at Evaluation unless otherwise noted.  PATIENT  SURVEYS:  Patient-Specific Activity Scoring Scheme  0 represents "unable to perform." 10 represents "able to perform at prior level. 0 1 2 3 4 5 6 7 8 9  10 (Date and Score)   Activity Eval  10/11/23  11/09/2023  1. Walking 2 5  7   2. Bending the knee(sitting, transfers) 2  5 5   3. Extending the knee 2 8 6   Score 2 6 6    Total score = sum of the activity scores/number of activities Minimum detectable change (90%CI) for average score = 2 points Minimum detectable change (90%CI) for single activity score = 3 points  COGNITIVE STATUS: Within functional limits for tasks assessed   SENSATION: WFL  GAIT: 09/08/23 Distance walked: 150' within clinic Assistive device utilized: Environmental consultant - 4 wheeled Level of assistance: Modified independence Comments: decreased stance on Rt, decr hip/knee flexion on Rt   EDEMA: 09/19/23: knee joint line: Rt: 59.5 centimeters  09/08/23  Knee joint line: Rt: 71 cm/Lt: 48.5 cm    LOWER EXTREMITY ROM:     ROM Right eval Left eval Rt 09/13/23 Rt 09/19/23 Right 09/21/23 Right  09/30/23 Rt 10/03/23 Rt 10/11/23 Rt 10/17/23 Right  10/28/23 Right 10/31/23 Right 11/04/23 Right 11/08/23 Right 11/10/23 Right 11/25/23  Knee flexion A: 21 P: 41 A: 108 A: 48 P: 60 A: 50 P: 60 P: 71* P 56* AA: 50 P: 58 AA:58 P: 60 AA: 60 AAROM 92 Supine AROM heel slide:  82  Supine AAROM heel slide: 92    Supine AROM heel slide 72*, AAROM 84* Seated P: 84*  Supine  AA: 86* A: 81  (Supine) A: 89* seated, AAROM seated 90*  Knee extension A: 8 (seated LAQ) P: 0 A: 0 A: 10 P: 8 A: 10 P: 6   A: -7 P: -5 A: -5 P: -2 A: -5 -5        (Blank rows = not tested)   LOWER EXTREMITY MMT:   09/08/2023 Not formally tested  at eval; Rt knee grossly 3-/5  MMT Right 09/28/2023 Rt 10/03/23 Seated HHD 10/17/23 Rt Seated  HHD Right 11/09/2023  Knee flexion  18.7 22.4 19.2 25.4   Knee extension 4/5 20.5 21.3 30.6 33.1 5/5 39.1, 38.3 lbs   (Blank rows = not tested)                   Today's Treatment  11/25/23  Nustep L6 x10 min all four extremities for knee flexion and endurance  Shuttle LE press BLEs 87# pauses into flexion and extension x15 Shuttle LE press RLE only 37# 1x15 pauses into flexion and extension x15  R LE on 6 inch step 10x5 second holds for flexion  Tandem walks in // bars x4 laps solid surface  Sidestepping in // bars x4 laps solid surface  Narrow BOS blue foam pad 3x30 seconds EC   Retrograde massage with LE in elevation     11/17/23 TherEx NuStep L6 x 10 min for knee flexion and endurance RLE on 6 step with flexion holds 10 x 10 sec hold  TherAct Forward step ups onto 6 block 2x10 bil; cues to decreased UE reliance with stepping Reciprocal LAQ/hamstring curl with L3 band 2x10 with 5 sec hold  Neuro Re-Ed Sidestepping on foam x 5 laps; intermittent UE support needed Tandem walking forward x 5 laps on foam; UE support needed  Manual Seated Rt knee flexion mobilization with movement to tolerance   11/11/23 TherEx SciFit UE/LE for 5 min fwd, 2 min  bwd, seat 11 full range lvl 1.0 Elevated seat knee self flexion 10x10 Elevated seat hamstring curl at end range 2x10x3 Elevated seated LAQ eccentrics 2x10  TherActivity Sit<>stand with knee flexed at 90 deg x10 Slow march 2x10 with UE support  Standing hamstring curl 2x10 with UE support Side step over 2 hurdles x 5 by counter with bilat UE support Fwd step over 2 hurdles x 5 with UE support  Amb and step over hurdles 2x 25'  Modalities Vaso x 10 min to Rt knee in elevation; mod pressure; 34 deg  11/10/23 TherEx NuStep L6 x 10 min; UE/LEs for ROM/endurance Knee extension 5# 3x10; bil concentric; RLE only  eccentric with flexion holds between sets x 30 sec Hamstring curl 15# 3x10 bil ROM measurements - see above for details  Neuro Re Ed Side stepping on foam x 5 laps with light UE support Tandem walking on foam x 5 laps with UE support needed  TherAct Leg press double leg 87# 2x10 with flexion to available range, RLE only 37# 2x10  Modalities Vaso x 10 min to Rt knee in elevation; mod pressure; 34 deg    11/09/2023 Therex:  Nustep Lvl 6 10 mins UE/LE for ROM, endurance Knee extension machine double leg up, single leg lowering 5 lbs 2 x 10, knee slot for flexion at 4 from back.   Knee extension machine long duration stretch with 5 lbs slot 4 from back Rt leg 3 mins  Knee flexion machine Rt leg only 10 lbs 2 x10   TherActivity Leg press double leg 87 lbs x 15 with flexion to available range, single leg 2 x 15  43 lbs  , performed bilaterally   Manual Seated Rt knee flexion mobilization c movement IR/distraction.   Vaso 10 mins Rt knee in elevation medium compression 34 deg with elevation.   PATIENT EDUCATION:  Education details: HEP Person educated: Patient Education method: Programmer, multimedia, Facilities manager, and Handouts Education comprehension: verbalized understanding, returned demonstration, and needs further education  HOME EXERCISE PROGRAM: Access Code: [email or web address] URL: [email or web address] Date: 11/11/2023 Prepared by: Gellen April Earnie Starring  Exercises - Quad Set  - 5-10 x daily - 7 x weekly - 1 sets - 5-10 reps - 5 sec hold - Supine Bridge  - 2 x daily - 7 x weekly - 2 sets - 10 reps - 3 seconds hold - Seated Knee Flexion Extension AROM   - 5-10 x daily - 7 x weekly - 1 sets - 5-10 reps - 5 sec  hold - Seated Heel Slide  - 2 x daily - 7 x weekly - 2 sets - 10 reps - Seated Knee Flexion AAROM  - 10 x daily - 7 x weekly - 1 sets - 10 reps - 10 second hold - Sit to Stand with Armchair  - 1 x daily - 7 x weekly - 2 sets - 10 reps - Standing Marching  - 1 x  daily - 7 x weekly - 2 sets - 10 reps - Standing Knee Flexion AROM with Chair Support  - 1 x daily - 7 x weekly - 2 sets - 10 reps   ASSESSMENT:  CLINICAL IMPRESSION:  Arrives today doing well, we continued working on general conditioning as well as on knee ROM today. Will continue to work towards independent exercise program and possible upcoming DC as per POC.      OBJECTIVE IMPAIRMENTS: Abnormal gait, decreased balance, decreased endurance, decreased knowledge of use of DME, decreased mobility, difficulty  walking, decreased ROM, decreased strength, hypomobility, increased edema, increased fascial restrictions, increased muscle spasms, and pain.    GOALS: Goals reviewed with patient? Yes  SHORT TERM GOALS: Target date: 10/06/2023  Independent with initial HEP Goal status: MET 10/03/23  2.  Rt knee ROM improved to 0-90 for improved mobility Goal status: Partially met 10/28/2023   LONG TERM GOALS: Target date: 12/08/2023  Independent with final HEP Goal status: revised on date 11/09/2023  2.  PSFS score improved by 3 points Goal status: MET 10/11/23  3.  Rt knee AROM improved to 0-100 for improved function and mobility Goal status:  revised on date 11/09/2023  4.  Report pain < 4/10 with standing and walking for improved function Goal status: revised on date 11/09/2023  5.  Amb without AD without significant deviations for improved community access Goal status: revised on date 11/09/2023    PLAN:  PT FREQUENCY: 1-2x/week  PT DURATION: 4 additional weeks  PLANNED INTERVENTIONS: 97164- PT Re-evaluation, 97750- Physical Performance Testing, 97110-Therapeutic exercises, 97530- Therapeutic activity, 97112- Neuromuscular re-education, 97535- Self Care, [id number]- Manual therapy, [phone]- Gait training, [phone]- Aquatic Therapy, [phone]- Electrical stimulation (unattended), 97016- Vasopneumatic device, Patient/Family education, Balance training, Stair training, Taping, Dry Needling, Joint  mobilization, DME instructions, Cryotherapy, and Moist heat.  PLAN FOR NEXT SESSION: address functional concerns/continue progressive challlenges,   Plan for HEP transitioning prep as able with reduced frequency compared to the week after manip.   Josette Rough, PT, DPT 11/25/23 12:27 PM

## 2023-11-28 ENCOUNTER — Encounter: Admitting: Physical Therapy

## 2023-11-29 ENCOUNTER — Encounter: Admitting: Physical Therapy

## 2023-11-30 NOTE — Therapy (Signed)
 OUTPATIENT PHYSICAL THERAPY TREATMENT Patient Name: Leah Hampton MRN: [id number] DOB:17-Feb-1956, 68 y.o., female Today's Date: 12/01/2023    END OF SESSION:  PT End of Session - 12/01/23 1645     Visit Number 29    Number of Visits 30    Date for PT Re-Evaluation 12/08/23    Authorization Type [employer] Medicare Dual Complete    PT Start Time 1445    PT Stop Time 1515    PT Time Calculation (min) 30 min    Activity Tolerance Patient tolerated treatment well    Behavior During Therapy [facility] for tasks assessed/performed              Past Medical History:  Diagnosis Date   Bronchitis    Chronic bronchitis (HCC)    Cystitis    Hematuria    urologice eval, Dr Alfonzo   History of chest pain    Hyperbilirubinemia    Hyperlipemia    Hypertelorism    Hypertension    Leukocytosis    Obesity    Osteoarthritis    right knee   Pre-diabetes    Sleep apnea 2012   slight   no CPAP   Vaginal Pap smear, abnormal    Vitamin D  deficiency 02/09/2018   Past Surgical History:  Procedure Laterality Date   CERVICAL POLYPECTOMY  07/15/2020   Procedure: POLYPECTOMY with Myosure;  Surgeon: Corene Coy, MD;  Location: [facility] OR;  Service: Gynecology;;   COLONOSCOPY WITH PROPOFOL  N/A 03/27/2015   Procedure: COLONOSCOPY WITH PROPOFOL ;  Surgeon: Toribio SHAUNNA Cedar, MD;  Location: [facility] ENDOSCOPY;  Service: Endoscopy;  Laterality: N/A;   COLONOSCOPY WITH PROPOFOL  N/A 10/02/2020   Procedure: COLONOSCOPY WITH PROPOFOL ;  Surgeon: Cedar Toribio SHAUNNA, MD;  Location: [facility];  Service: Endoscopy;  Laterality: N/A;   COLPOSCOPY N/A 07/15/2020   Procedure: COLPOSCOPY;  Surgeon: Corene Coy, MD;  Location: [facility] OR;  Service: Gynecology;  Laterality: N/A;   DILATATION & CURETTAGE/HYSTEROSCOPY WITH MYOSURE N/A 01/05/2017   Procedure: DILATATION & CURETTAGE/HYSTEROSCOPY WITH MYOSURE;  Surgeon: Timmie Norris, MD;  Location: [facility] ORS;  Service: Gynecology;  Laterality: N/A;  PostMenopausal  Bleeding   HYSTEROSCOPY WITH D & C N/A 05/22/2019   Procedure: DILATATION AND CURETTAGE /HYSTEROSCOPY, POLYPECTOMY WITH MYOSURE;  Surgeon: Corene Coy, MD;  Location: [facility] OR[facility];  Service: Gynecology;  Laterality: N/A;   HYSTEROSCOPY WITH D & C N/A 07/15/2020   Procedure: DILATATION AND CURETTAGE /HYSTEROSCOPY;  Surgeon: Corene Coy, MD;  Location: [facility] OR;  Service: Gynecology;  Laterality: N/A;   KNEE ARTHROSCOPY  04/2007   left knee   KNEE CLOSED REDUCTION Right 10/27/2023   Procedure: MANIPULATION, KNEE, CLOSED;  Surgeon: Vernetta Lonni GRADE, MD;  Location: [facility];  Service: Orthopedics;  Laterality: Right;   LAPAROSCOPIC GASTRIC SLEEVE RESECTION     TOTAL KNEE ARTHROPLASTY Left 10/2008   Dr Sherryn   TOTAL KNEE ARTHROPLASTY Right 08/26/2023   Procedure: ARTHROPLASTY, KNEE, TOTAL;  Surgeon: Vernetta Lonni GRADE, MD;  Location: [facility] OR[facility];  Service: Orthopedics;  Laterality: Right;   UPPER GI ENDOSCOPY N/A 10/14/2020   Procedure: UPPER GI ENDOSCOPY;  Surgeon: Blush Locus, MD;  Location: [facility];  Service: General;  Laterality: N/A;   Patient Active Problem List   Diagnosis Date Noted   Arthrofibrosis of knee joint, right 10/12/2023   Status post total right knee replacement 08/26/2023   Sleep disturbance 01/21/2023   Lactose intolerance 10/22/2022   S/P gastric sleeve procedure 11/04/2020   Uncomplicated asthma 11/04/2020   Gastroesophageal  reflux disease 11/04/2020   Primary osteoarthritis of both hips 09/30/2020   History of total left knee replacement 07/29/2020   Endometrial polyp 07/15/2020   Low grade squamous intraepithelial lesion (LGSIL) on cervical Pap smear 07/07/2020   Upper abdominal pain 08/20/2019   Right hip pain 05/28/2019   Post-menopausal bleeding 05/16/2019   Greater trochanteric pain syndrome of left lower extremity 03/20/2019   Type 2 diabetes mellitus without complication, without long-term current use of insulin  (HCC) 05/18/2018    Vitamin D  deficiency 02/09/2018   Onychomycosis 09/19/2017   Tinea pedis of both feet 09/19/2017   Obstructive chronic bronchitis with exacerbation (HCC) 05/23/2017   Mixed hyperlipidemia 05/01/2014   Genital herpes 05/23/2012   Stress incontinence 03/04/2012   Unilateral primary osteoarthritis, right knee 10/12/2011   CTS (carpal tunnel syndrome) 07/13/2011   OSA (obstructive sleep apnea) 12/10/2010   Severe obesity (BMI >= 40) (HCC) 07/31/2008   Essential hypertension 07/03/2008    PCP: Daryl Setter, NP  REFERRING PROVIDER: Vernetta Lonni GRADE, MD  REFERRING DIAG: M17.11 (ICD-10-CM) - Unilateral primary osteoarthritis, right knee  Rationale for Evaluation and Treatment: Rehabilitation  THERAPY DIAG:  Acute pain of right knee  Stiffness of right knee, not elsewhere classified  Localized edema  Other abnormalities of gait and mobility  ONSET DATE: 08/26/23 (DOS)   SUBJECTIVE:                                                                                                                                                                                           SUBJECTIVE STATEMENT:  Pt reports knee feeling better and able to ambulate around the house a little without the cane, but feels less confident and balanced with walking outside.  PERTINENT HISTORY:  besity, HTN, HLD, DM, OA, Lt TKA in 2010, s/p gastric sleeve resection 2022  PAIN:  NPRS scale: 3/10 now  Pain location: Rt knee Pain description: sore and tight  Aggravating factors: Knee flexion Relieving factors: Prescription pain medication, ice  PRECAUTIONS:  None  RED FLAGS: None   WEIGHT BEARING RESTRICTIONS:  No  FALLS:  Has patient fallen in last 6 months? No  LIVING ENVIRONMENT: Lives with: lives alone Lives in: House/apartment Stairs: Yes: External: 16 steps; can reach both Has following equipment at home: Walker - 4 wheeled  OCCUPATION:  On disability  PLOF:  Independent  and Leisure: watching TV, shopping, has a fitness center at her apartment complex  PATIENT GOALS:  Improve function in knee, get back to gym  OBJECTIVE:  Note: Objective measures were completed at Evaluation unless otherwise noted.  PATIENT SURVEYS:  Patient-Specific Activity Scoring Scheme  0  represents "unable to perform." 10 represents "able to perform at prior level. 0 1 2 3 4 5 6 7 8 9  10 (Date and Score)   Activity Eval  10/11/23  11/09/2023  1. Walking 2 5  7   2. Bending the knee(sitting, transfers) 2  5 5   3. Extending the knee 2 8 6   Score 2 6 6    Total score = sum of the activity scores/number of activities Minimum detectable change (90%CI) for average score = 2 points Minimum detectable change (90%CI) for single activity score = 3 points  COGNITIVE STATUS: Within functional limits for tasks assessed   SENSATION: WFL  GAIT: 09/08/23 Distance walked: 150' within clinic Assistive device utilized: Environmental consultant - 4 wheeled Level of assistance: Modified independence Comments: decreased stance on Rt, decr hip/knee flexion on Rt   EDEMA: 09/19/23: knee joint line: Rt: 59.5 centimeters  09/08/23  Knee joint line: Rt: 71 cm/Lt: 48.5 cm    LOWER EXTREMITY ROM:     ROM Right eval Left eval Rt 09/13/23 Rt 09/19/23 Right 09/21/23 Right  09/30/23 Rt 10/03/23 Rt 10/11/23 Rt 10/17/23 Right  10/28/23 Right 10/31/23 Right 11/04/23 Right 11/08/23 Right 11/10/23 Right 11/25/23  Knee flexion A: 21 P: 41 A: 108 A: 48 P: 60 A: 50 P: 60 P: 71* P 56* AA: 50 P: 58 AA:58 P: 60 AA: 60 AAROM 92 Supine AROM heel slide:  82  Supine AAROM heel slide: 92    Supine AROM heel slide 72*, AAROM 84* Seated P: 84*  Supine  AA: 86* A: 81  (Supine) A: 89* seated, AAROM seated 90*  Knee extension A: 8 (seated LAQ) P: 0 A: 0 A: 10 P: 8 A: 10 P: 6   A: -7 P: -5 A: -5 P: -2 A: -5 -5        (Blank rows = not tested)   LOWER EXTREMITY MMT:   09/08/2023 Not formally tested at eval; Rt  knee grossly 3-/5  MMT Right 09/28/2023 Rt 10/03/23 Seated HHD 10/17/23 Rt Seated  HHD Right 11/09/2023  Knee flexion  18.7 22.4 19.2 25.4   Knee extension 4/5 20.5 21.3 30.6 33.1 5/5 39.1, 38.3 lbs   (Blank rows = not tested)                   Today's Treatment 12/01/23  Nustep L6 for 6 minutes Shuttle LE press BLEs 87# pauses into flexion and extension inc to 20x Shuttle LE press RLE only 37# 1x15 pauses into flexion and extension inc to 20x  LE on 6 inch step taps 20x each foot  LE on 6 inch step step ups with 1 hand on handrail for assist 20x each  Tandem walks in // bars x4 laps solid surface  Sidestepping in // bars x4 laps solid surface  Steps onto blue foam pad 20x      11/25/23  Nustep L6 x10 min all four extremities for knee flexion and endurance  Shuttle LE press BLEs 87# pauses into flexion and extension x15 Shuttle LE press RLE only 37# 1x15 pauses into flexion and extension x15  R LE on 6 inch step 10x5 second holds for flexion  Tandem walks in // bars x4 laps solid surface  Sidestepping in // bars x4 laps solid surface  Narrow BOS blue foam pad 3x30 seconds EC   Retrograde massage with LE in elevation     11/17/23 TherEx NuStep L6 x 10 min for knee flexion and endurance RLE on 6  step with flexion holds 10 x 10 sec hold  TherAct Forward step ups onto 6 block 2x10 bil; cues to decreased UE reliance with stepping Reciprocal LAQ/hamstring curl with L3 band 2x10 with 5 sec hold  Neuro Re-Ed Sidestepping on foam x 5 laps; intermittent UE support needed Tandem walking forward x 5 laps on foam; UE support needed  Manual Seated Rt knee flexion mobilization with movement to tolerance   11/11/23 TherEx SciFit UE/LE for 5 min fwd, 2 min bwd, seat 11 full range lvl 1.0 Elevated seat knee self flexion 10x10 Elevated seat hamstring curl at end range 2x10x3 Elevated seated LAQ eccentrics 2x10  TherActivity Sit<>stand with knee flexed at 90 deg  x10 Slow march 2x10 with UE support  Standing hamstring curl 2x10 with UE support Side step over 2 hurdles x 5 by counter with bilat UE support Fwd step over 2 hurdles x 5 with UE support  Amb and step over hurdles 2x 25'  Modalities Vaso x 10 min to Rt knee in elevation; mod pressure; 34 deg  11/10/23 TherEx NuStep L6 x 10 min; UE/LEs for ROM/endurance Knee extension 5# 3x10; bil concentric; RLE only eccentric with flexion holds between sets x 30 sec Hamstring curl 15# 3x10 bil ROM measurements - see above for details  Neuro Re Ed Side stepping on foam x 5 laps with light UE support Tandem walking on foam x 5 laps with UE support needed  TherAct Leg press double leg 87# 2x10 with flexion to available range, RLE only 37# 2x10  Modalities Vaso x 10 min to Rt knee in elevation; mod pressure; 34 deg    11/09/2023 Therex:  Nustep Lvl 6 10 mins UE/LE for ROM, endurance Knee extension machine double leg up, single leg lowering 5 lbs 2 x 10, knee slot for flexion at 4 from back.   Knee extension machine long duration stretch with 5 lbs slot 4 from back Rt leg 3 mins  Knee flexion machine Rt leg only 10 lbs 2 x10   TherActivity Leg press double leg 87 lbs x 15 with flexion to available range, single leg 2 x 15  43 lbs  , performed bilaterally   Manual Seated Rt knee flexion mobilization c movement IR/distraction.   Vaso 10 mins Rt knee in elevation medium compression 34 deg with elevation.   PATIENT EDUCATION:  Education details: HEP Person educated: Patient Education method: Programmer, multimedia, Facilities manager, and Handouts Education comprehension: verbalized understanding, returned demonstration, and needs further education  HOME EXERCISE PROGRAM: Access Code: [email or web address] URL: [email or web address] Date: 11/11/2023 Prepared by: Gellen April Marie Nonato  Exercises - Quad Set  - 5-10 x daily - 7 x weekly - 1 sets - 5-10 reps - 5 sec hold - Supine Bridge  - 2 x  daily - 7 x weekly - 2 sets - 10 reps - 3 seconds hold - Seated Knee Flexion Extension AROM   - 5-10 x daily - 7 x weekly - 1 sets - 5-10 reps - 5 sec  hold - Seated Heel Slide  - 2 x daily - 7 x weekly - 2 sets - 10 reps - Seated Knee Flexion AAROM  - 10 x daily - 7 x weekly - 1 sets - 10 reps - 10 second hold - Sit to Stand with Armchair  - 1 x daily - 7 x weekly - 2 sets - 10 reps - Standing Marching  - 1 x daily - 7 x weekly - 2 sets - 10 reps -  Standing Knee Flexion AROM with Chair Support  - 1 x daily - 7 x weekly - 2 sets - 10 reps   ASSESSMENT:  CLINICAL IMPRESSION:  Arrives today 15 minutes late due to transportation issues.  We were able to get 30 minutes of PT into session.  Pt needed VC for foot placement with step ups and demonstrated understanding.  Needed use of handrail while ambulating in the ll bars.  OBJECTIVE IMPAIRMENTS: Abnormal gait, decreased balance, decreased endurance, decreased knowledge of use of DME, decreased mobility, difficulty walking, decreased ROM, decreased strength, hypomobility, increased edema, increased fascial restrictions, increased muscle spasms, and pain.    GOALS: Goals reviewed with patient? Yes  SHORT TERM GOALS: Target date: 10/06/2023  Independent with initial HEP Goal status: MET 10/03/23  2.  Rt knee ROM improved to 0-90 for improved mobility Goal status: Partially met 10/28/2023   LONG TERM GOALS: Target date: 12/08/2023  Independent with final HEP Goal status: revised on date 11/09/2023  2.  PSFS score improved by 3 points Goal status: MET 10/11/23  3.  Rt knee AROM improved to 0-100 for improved function and mobility Goal status:  revised on date 11/09/2023  4.  Report pain < 4/10 with standing and walking for improved function Goal status: revised on date 11/09/2023  5.  Amb without AD without significant deviations for improved community access Goal status: revised on date 11/09/2023    PLAN:  PT FREQUENCY:  1-2x/week  PT DURATION: 4 additional weeks  PLANNED INTERVENTIONS: 97164- PT Re-evaluation, 97750- Physical Performance Testing, 97110-Therapeutic exercises, 97530- Therapeutic activity, 97112- Neuromuscular re-education, 97535- Self Care, [id number]- Manual therapy, [phone]- Gait training, [phone]- Aquatic Therapy, [phone]- Electrical stimulation (unattended), 97016- Vasopneumatic device, Patient/Family education, Balance training, Stair training, Taping, Dry Needling, Joint mobilization, DME instructions, Cryotherapy, and Moist heat.  PLAN FOR NEXT SESSION: Increase ambulation quality and distance without AD via balance exercises and gait training. Burnard Meth, PT 12/01/23  4:52 PM

## 2023-12-01 ENCOUNTER — Ambulatory Visit

## 2023-12-01 DIAGNOSIS — M25561 Pain in right knee: Secondary | ICD-10-CM

## 2023-12-01 DIAGNOSIS — M25661 Stiffness of right knee, not elsewhere classified: Secondary | ICD-10-CM

## 2023-12-01 DIAGNOSIS — R2689 Other abnormalities of gait and mobility: Secondary | ICD-10-CM | POA: Diagnosis not present

## 2023-12-01 DIAGNOSIS — R600 Localized edema: Secondary | ICD-10-CM | POA: Diagnosis not present

## 2023-12-02 ENCOUNTER — Other Ambulatory Visit: Payer: Self-pay | Admitting: Family

## 2023-12-02 DIAGNOSIS — Z1231 Encounter for screening mammogram for malignant neoplasm of breast: Secondary | ICD-10-CM

## 2023-12-05 ENCOUNTER — Encounter: Admitting: Physical Therapy

## 2023-12-06 ENCOUNTER — Ambulatory Visit (INDEPENDENT_AMBULATORY_CARE_PROVIDER_SITE_OTHER): Admitting: *Deleted

## 2023-12-06 VITALS — Ht 63.0 in | Wt 203.0 lb

## 2023-12-06 DIAGNOSIS — Z0000 Encounter for general adult medical examination without abnormal findings: Secondary | ICD-10-CM

## 2023-12-06 NOTE — Patient Instructions (Signed)
 Leah Hampton , Thank you for taking time out of your busy schedule to complete your Annual Wellness Visit with me. I enjoyed our conversation and look forward to speaking with you again next year. I, as well as your care team,  appreciate your ongoing commitment to your health goals. Please review the following plan we discussed and let me know if I can assist you in the future. Your Game plan/ To Do List    Referrals: If you haven't heard from the office you've been referred to, please reach out to them at the phone provided.   Please schedule your pap smear once you have recovered from knee surgery. Call [phone] to schedule.  Follow up Visits: Next Medicare AWV with our clinical staff: 12/06/24 9am    Next Office Visit with your provider: 12/23/23 10am  Clinician Recommendations:  Aim for 30 minutes of exercise or brisk walking, 6-8 glasses of water , and 5 servings of fruits and vegetables each day.   You will need to get the following vaccines at your local pharmacy: Tetanus, COVID  Please be prepared to give a urine sample at your upcoming appointment with [name].     This is a list of the screening recommended for you and due dates:  Health Maintenance  Topic Date Due   Yearly kidney health urinalysis for diabetes  07/12/2012   DTaP/Tdap/Td vaccine (2 - Tdap) 07/03/2018   COVID-19 Vaccine (7 - 2024-25 season) 01/16/2023   Pap Smear  09/08/2023   Medicare Annual Wellness Visit  11/25/2023   Flu Shot  12/16/2023   Hemoglobin A1C  02/22/2024   Complete foot exam   05/23/2024   Yearly kidney function blood test for diabetes  10/26/2024   DEXA scan (bone density measurement)  10/27/2024   Eye exam for diabetics  11/13/2024   Mammogram  12/29/2024   Colon Cancer Screening  10/03/2030   Pneumococcal Vaccine for age over 28  Completed   Hepatitis C Screening  Completed   Zoster (Shingles) Vaccine  Completed   Hepatitis B Vaccine  Aged Out   HPV Vaccine  Aged Out   Meningitis B  Vaccine  Aged Out    Advanced directives: (Copy Requested) Please bring a copy of your health care power of attorney and living will to the office to be added to your chart at your convenience. You can mail to [facility] [address] [place], [place] [address] or email to [email or web address] Advance Care Planning is important because it:  [x]  Makes sure you receive the medical care that is consistent with your values, goals, and preferences  [x]  It provides guidance to your family and loved ones and reduces their decisional burden about whether or not they are making the right decisions based on your wishes.  Follow the link provided in your after visit summary or read over the paperwork we have mailed to you to help you started getting your Advance Directives in place. If you need assistance in completing these, please reach out to us  so that we can help you!  See attachments for Preventive Care and Fall Prevention Tips.

## 2023-12-06 NOTE — Progress Notes (Signed)
 Please attest this visit in the absence of patient primary care provider.    Subjective:   Leah Hampton is a 68 y.o. who presents for a Medicare Wellness preventive visit.  As a reminder, Annual Wellness Visits don't include a physical exam, and some assessments may be limited, especially if this visit is performed virtually. We may recommend an in-person follow-up visit with your provider if needed.  Visit Complete: Virtual I connected with  Leah Hampton on 12/06/23 by a audio enabled telemedicine application and verified that I am speaking with the correct person using two identifiers.  Patient Location: Home  Provider Location: Office/Clinic  I discussed the limitations of evaluation and management by telemedicine. The patient expressed understanding and agreed to proceed.  Vital Signs: Because this visit was a virtual/telehealth visit, some criteria may be missing or patient reported. Any vitals not documented were not able to be obtained and vitals that have been documented are patient reported.  VideoDeclined- This patient declined Librarian, academic. Therefore the visit was completed with audio only.  Persons Participating in Visit: Patient.  AWV Questionnaire: Yes: Patient Medicare AWV questionnaire was completed by the patient on 12/05/23; I have confirmed that all information answered by patient is correct and no changes since this date.  Cardiac Risk Factors include: advanced age (>42men, >55 women);diabetes mellitus;dyslipidemia;hypertension;obesity (BMI >30kg/m2);Other (see comment), Risk factor comments: OSA     Objective:    Today's Vitals   12/06/23 0816  Weight: 203 lb (92.1 kg)  Height: 5' 3 (1.6 m)   Body mass index is 35.96 kg/m.     12/06/2023    8:37 AM 10/26/2023    9:07 AM 09/08/2023    3:02 PM 08/26/2023    5:34 PM 08/26/2023   10:39 AM 08/22/2023   10:10 AM 05/16/2023   11:24 AM  Advanced Directives  Does Patient  Have a Medical Advance Directive? No No No No No No No  Would patient like information on creating a medical advance directive? No - Patient declined  No - Patient declined No - Patient declined No - Patient declined No - Patient declined No - Patient declined    Current Medications (verified) Outpatient Encounter Medications as of 12/06/2023  Medication Sig   albuterol  (VENTOLIN  HFA) 108 (90 Base) MCG/ACT inhaler Inhale 2 puffs into the lungs every 6 (six) hours as needed for wheezing or shortness of breath.   amLODipine  (NORVASC ) 5 MG tablet Take 1 tablet (5 mg total) by mouth daily.   beclomethasone (QVAR  REDIHALER) 80 MCG/ACT inhaler USE 1 INHALATION BY MOUTH INTO  THE LUNGS TWICE DAILY   Calcium-Vitamin D -Vitamin K (VIACTIV CALCIUM PLUS D) 650-12.5-40 MG-MCG-MCG CHEW Chew 1 tablet by mouth in the morning, at noon, and at bedtime.   diclofenac  Sodium (VOLTAREN ) 1 % GEL Apply 2 g topically daily as needed (pain).   FIBER GUMMIES PO Take 1-3 tablets by mouth daily as needed (regularity.). Metamucil Fiber Gummies for Adults   losartan  (COZAAR ) 100 MG tablet Take 1 tablet (100 mg total) by mouth daily.   methocarbamol  (ROBAXIN ) 500 MG tablet Take 1 tablet (500 mg total) by mouth every 6 (six) hours as needed for muscle spasms.   Multiple Vitamins-Minerals (BARIATRIC MULTIVITAMINS PO) Take 1 capsule by mouth daily in the afternoon.   oxyCODONE  (OXY IR/ROXICODONE ) 5 MG immediate release tablet Take 1 tablet (5 mg total) by mouth 3 (three) times daily as needed for moderate pain (pain score 4-6) (pain score  4-6).   pravastatin  (PRAVACHOL ) 80 MG tablet Take 1 tablet (80 mg total) by mouth daily.   tirzepatide  (MOUNJARO ) 7.5 MG/0.5ML Pen Inject 7.5 mg into the skin once a week.   valACYclovir  (VALTREX ) 1000 MG tablet Take 1 tablet (1,000 mg total) by mouth daily.   No facility-administered encounter medications on file as of 12/06/2023.    Allergies (verified) Sulfamethoxazole -trimethoprim  [bactrim], Voltaren  [diclofenac ], and Codeine   History: Past Medical History:  Diagnosis Date   Bronchitis    Chronic bronchitis (HCC)    Cystitis    Hematuria    urologice eval, Dr Alfonzo   History of chest pain    Hyperbilirubinemia    Hyperlipemia    Hypertelorism    Hypertension    Leukocytosis    Obesity    Osteoarthritis    right knee   Pre-diabetes    Sleep apnea 2012   slight   no CPAP   Vaginal Pap smear, abnormal    Vitamin D  deficiency 02/09/2018   Past Surgical History:  Procedure Laterality Date   CERVICAL POLYPECTOMY  07/15/2020   Procedure: POLYPECTOMY with Myosure;  Surgeon: Corene Coy, MD;  Location: [facility] OR;  Service: Gynecology;;   COLONOSCOPY WITH PROPOFOL  N/A 03/27/2015   Procedure: COLONOSCOPY WITH PROPOFOL ;  Surgeon: Toribio SHAUNNA Cedar, MD;  Location: [facility] ENDOSCOPY;  Service: Endoscopy;  Laterality: N/A;   COLONOSCOPY WITH PROPOFOL  N/A 10/02/2020   Procedure: COLONOSCOPY WITH PROPOFOL ;  Surgeon: Cedar Toribio SHAUNNA, MD;  Location: [facility] END[facility]COPY;  Service: Endoscopy;  Laterality: N/A;   COLPOSCOPY N/A 07/15/2020   Procedure: COLPOSCOPY;  Surgeon: Corene Coy, MD;  Location: [facility] OR;  Service: Gynecology;  Laterality: N/A;   DILATATION & CURETTAGE/HYSTEROSCOPY WITH MYOSURE N/A 01/05/2017   Procedure: DILATATION & CURETTAGE/HYSTEROSCOPY WITH MYOSURE;  Surgeon: Timmie Norris, MD;  Location: [facility] ORS;  Service: Gynecology;  Laterality: N/A;  PostMenopausal Bleeding   HYSTEROSCOPY WITH D & C N/A 05/22/2019   Procedure: DILATATION AND CURETTAGE /HYSTEROSCOPY, POLYPECTOMY WITH MYOSURE;  Surgeon: Corene Coy, MD;  Location: [facility] ORS;  Service: Gynecology;  Laterality: N/A;   HYSTEROSCOPY WITH D & C N/A 07/15/2020   Procedure: DILATATION AND CURETTAGE /HYSTEROSCOPY;  Surgeon: Corene Coy, MD;  Location: [facility] OR;  Service: Gynecology;  Laterality: N/A;   KNEE ARTHROSCOPY  04/2007   left knee   KNEE CLOSED REDUCTION Right  10/27/2023   Procedure: MANIPULATION, KNEE, CLOSED;  Surgeon: Vernetta Lonni GRADE, MD;  Location: [facility] ORS;  Service: Orthopedics;  Laterality: Right;   LAPAROSCOPIC GASTRIC SLEEVE RESECTION     TOTAL KNEE ARTHROPLASTY Left 10/2008   Dr Sherryn   TOTAL KNEE ARTHROPLASTY Right 08/26/2023   Procedure: ARTHROPLASTY, KNEE, TOTAL;  Surgeon: Vernetta Lonni GRADE, MD;  Location: [facility] ORS;  Service: Orthopedics;  Laterality: Right;   UPPER GI ENDOSCOPY N/A 10/14/2020   Procedure: UPPER GI ENDOSCOPY;  Surgeon: Tanda Locus, MD;  Location: [facility] ORS;  Service: General;  Laterality: N/A;   Family History  Problem Relation Age of Onset   Heart failure Mother    Arthritis Mother    Hypertension Mother    Stroke Mother    Diabetes Mellitus II Mother    Heart attack Father 41   Diabetes Other    Arthritis Other    Stroke Other    Coronary artery disease Other    Hypertension Sister        x 2   CVA Sister    CAD Sister 34   Diabetes Mellitus II Sister  Cirrhosis Brother    Alcohol abuse Brother    Colon cancer Neg Hx    Social History   Socioeconomic History   Marital status: Single    Spouse name: Not on file   Number of children: 2   Years of education: Not on file   Highest education level: GED or equivalent  Occupational History   Occupation: disabled  Tobacco Use   Smoking status: Never   Smokeless tobacco: Never  Vaping Use   Vaping status: Never Used  Substance and Sexual Activity   Alcohol use: No   Drug use: No   Sexual activity: Not Currently  Other Topics Concern   Not on file  Social History Narrative   Regular exercise:  No   Lives alone   Has 2 grown sons, both local.  1 grandson   On disability due to her knee pain   Completed 12th grade   Enjoys television   No pets.    Social Drivers of Corporate investment banker Strain: Low Risk  (12/05/2023)   Overall Financial Resource Strain (CARDIA)    Difficulty of Paying Living Expenses: Not very hard  Food  Insecurity: No Food Insecurity (12/05/2023)   Hunger Vital Sign    Worried About Running Out of Food in the Last Year: Never true    Ran Out of Food in the Last Year: Never true  Transportation Needs: No Transportation Needs (12/06/2023)   PRAPARE - Administrator, Civil Service (Medical): No    Lack of Transportation (Non-Medical): No  Physical Activity: Insufficiently Active (12/05/2023)   Exercise Vital Sign    Days of Exercise per Week: 3 days    Minutes of Exercise per Session: 20 min  Stress: No Stress Concern Present (12/05/2023)   [employer] of Occupational Health - Occupational Stress Questionnaire    Feeling of Stress: Only a little  Social Connections: Unknown (12/06/2023)   Social Connection and Isolation Panel    Frequency of Communication with Friends and Family: Not on file    Frequency of Social Gatherings with Friends and Family: Not on file    Attends Religious Services: Not on file    Active Member of Clubs or Organizations: Not on file    Attends Banker Meetings: Not on file    Marital Status: Widowed  Recent Concern: Social Connections - Moderately Isolated (12/05/2023)   Social Connection and Isolation Panel    Frequency of Communication with Friends and Family: More than three times a week    Frequency of Social Gatherings with Friends and Family: More than three times a week    Attends Religious Services: Patient declined    Database administrator or Organizations: Yes    Attends Banker Meetings: Patient declined    Marital Status: Never married    Tobacco Counseling Counseling given: Not Answered    Clinical Intake:     Pain : No/denies pain     BMI - recorded: 35.96 Nutritional Status: BMI > 30  Obese Diabetes: Yes CBG done?: No Did pt. bring in CBG monitor from home?: No  Lab Results  Component Value Date   HGBA1C 5.7 08/23/2023   HGBA1C 5.9 05/24/2023   HGBA1C 5.8 01/21/2023     How often do  you need to have someone help you when you read instructions, pamphlets, or other written materials from your doctor or pharmacy?: 1 - Never What is the last grade level you completed  in school?: GED  Interpreter Needed?: No  Information entered by :: Lolita Libra, CMA   Activities of Daily Living     12/05/2023    4:11 PM 10/26/2023    9:11 AM  In your present state of health, do you have any difficulty performing the following activities:  Hearing? 0   Vision? 0   Difficulty concentrating or making decisions? 0   Walking or climbing stairs? 1   Dressing or bathing? 0   Doing errands, shopping? 0 0  Preparing Food and eating ? N   Using the Toilet? N   In the past six months, have you accidently leaked urine? N   Do you have problems with loss of bowel control? N   Managing your Medications? N   Managing your Finances? N   Housekeeping or managing your Housekeeping? N     Patient Care Team: Daryl Setter, NP as PCP - General (Internal Medicine) Kate Lonni CROME, MD as PCP - Cardiology (Cardiology) Daryl Setter, NP as Nurse Practitioner (Internal Medicine) [facility] Corene Coy, MD as Consulting Physician (Obstetrics and Gynecology)  I have updated your Care Teams any recent Medical Services you may have received from other providers in the past year.     Assessment:   This is a routine wellness examination for Vern.  Hearing/Vision screen Hearing Screening - Comments:: Denies hearing difficulties.  Vision Screening - Comments:: Last eye exam 11/2023 with [facility] care.    Goals Addressed               This Visit's Progress     Patient Stated (pt-stated)        She would like to be able to walk without using cane and get weight down 180lb.       Depression Screen     12/06/2023    8:34 AM 05/24/2023   10:56 AM 11/25/2022   11:41 AM 09/08/2022    1:27 PM 07/06/2022   10:51 AM 11/23/2021    1:03 PM  11/18/2020    9:47 AM  PHQ 2/9 Scores  PHQ - 2 Score 0 0 0 1 0 0 0  PHQ- 9 Score  0 0 7 0      Fall Risk     12/05/2023    4:11 PM 05/24/2023   10:56 AM 11/25/2022   11:47 AM 07/06/2022   10:51 AM 11/23/2021    1:03 PM  Fall Risk   Falls in the past year? 0 0 0 0 0  Number falls in past yr: 0 0 0 0 0  Injury with Fall? 0 0 0 0 0  Risk for fall due to :  No Fall Risks No Fall Risks No Fall Risks No Fall Risks  Follow up Education provided Falls evaluation completed Falls prevention discussed;Falls evaluation completed;Education provided Falls evaluation completed Falls evaluation completed      Data saved with a previous flowsheet row definition    MEDICARE RISK AT HOME:  Medicare Risk at Home Any stairs in or around the home?: (Patient-Rptd) Yes If so, are there any without handrails?: (Patient-Rptd) No Home free of loose throw rugs in walkways, pet beds, electrical cords, etc?: (Patient-Rptd) Yes Adequate lighting in your home to reduce risk of falls?: (Patient-Rptd) Yes Life alert?: (Patient-Rptd) No Use of a cane, walker or w/c?: Yes (Using cane now s/p knee sugery.) Grab bars in the bathroom?: (Patient-Rptd) Yes Shower chair or bench in shower?: (Patient-Rptd) Yes Elevated toilet seat  or a handicapped toilet?: (Patient-Rptd) Yes  TIMED UP AND GO:  Was the test performed?  No,audio  Cognitive Function: 6CIT completed        12/06/2023    8:40 AM 11/25/2022   11:49 AM 11/23/2021    1:11 PM  6CIT Screen  What Year? 0 points 0 points 0 points  What month? 0 points 0 points 0 points  What time? 0 points 0 points 0 points  Count back from 20 0 points 0 points 0 points  Months in reverse 0 points 0 points 0 points  Repeat phrase 0 points 0 points 4 points  Total Score 0 points 0 points 4 points    Immunizations Immunization History  Administered Date(s) Administered   Fluad Quad(high Dose 65+) 02/09/2019   Fluad Trivalent(High Dose 65+) 01/21/2023   Influenza Whole  02/24/2010   Influenza,inj,Quad PF,6+ Mos 05/01/2014, 03/22/2016, 02/16/2017, 01/04/2018, 01/23/2019, 03/14/2020, 04/06/2021, 04/06/2022   PFIZER Comirnaty (Gray Top)Covid-19 Tri-Sucrose Vaccine 11/04/2020   PFIZER(Purple Top)SARS-COV-2 Vaccination 08/17/2019, 09/12/2019, 03/14/2020   PNEUMOCOCCAL CONJUGATE-20 11/04/2020   Pfizer Covid-19 Vaccine Bivalent Booster 93yrs & up 04/06/2021   Pfizer(Comirnaty )Fall Seasonal Vaccine 12 years and older 04/06/2022, 02/08/2023   Pneumococcal Polysaccharide-23 06/13/2014, 07/27/2019   Td 07/03/2008   Zoster Recombinant(Shingrix ) 12/04/2021, 06/15/2022    Screening Tests Health Maintenance  Topic Date Due   Diabetic kidney evaluation - Urine ACR  07/12/2012   DTaP/Tdap/Td (2 - Tdap) 07/03/2018   COVID-19 Vaccine (8 - 2024-25 season) 08/08/2023   Medicare Annual Wellness (AWV)  11/25/2023   Cervical Cancer Screening (Pap smear)  06/07/2024 (Originally 09/08/2023)   INFLUENZA VACCINE  12/16/2023   HEMOGLOBIN A1C  02/22/2024   FOOT EXAM  05/23/2024   Diabetic kidney evaluation - eGFR measurement  10/26/2024   DEXA SCAN  10/27/2024   OPHTHALMOLOGY EXAM  11/13/2024   MAMMOGRAM  12/29/2024   Colonoscopy  10/03/2030   Pneumococcal Vaccine: 50+ Years  Completed   Hepatitis C Screening  Completed   Zoster Vaccines- Shingrix   Completed   Hepatitis B Vaccines  Aged Out   HPV VACCINES  Aged Out   Meningococcal B Vaccine  Aged Out    Health Maintenance  Health Maintenance Due  Topic Date Due   Diabetic kidney evaluation - Urine ACR  07/12/2012   DTaP/Tdap/Td (2 - Tdap) 07/03/2018   COVID-19 Vaccine (8 - 2024-25 season) 08/08/2023   Medicare Annual Wellness (AWV)  11/25/2023   Health Maintenance Items Addressed: Will get tetanus and COVID boosters at pharmacy.  Will do urine MALB at next OV. Will schedule pap smear after recovery from knee surgery.  Additional Screening:  Vision Screening: Recommended annual ophthalmology exams for early  detection of glaucoma and other disorders of the eye. Would you like a referral to an eye doctor? No    Dental Screening: Recommended annual dental exams for proper oral hygiene  Community Resource Referral / Chronic Care Management: CRR required this visit?  No   CCM required this visit?  No   Plan:    I have personally reviewed and noted the following in the patient's chart:   Medical and social history Use of alcohol, tobacco or illicit drugs  Current medications and supplements including opioid prescriptions. Patient is currently taking opioid prescriptions. Information provided to patient regarding non-opioid alternatives. Patient advised to discuss non-opioid treatment plan with their provider. Functional ability and status Nutritional status Physical activity Advanced directives List of other physicians Hospitalizations, surgeries, and ER visits in previous 12 months Vitals  Screenings to include cognitive, depression, and falls Referrals and appointments  In addition, I have reviewed and discussed with patient certain preventive protocols, quality metrics, and best practice recommendations. A written personalized care plan for preventive services as well as general preventive health recommendations were provided to patient.   Lolita Libra, CMA   12/06/2023   After Visit Summary: (MyChart) Due to this being a telephonic visit, the after visit summary with patients personalized plan was offered to patient via MyChart   Notes: Nothing significant to report at this time.

## 2023-12-08 ENCOUNTER — Ambulatory Visit (INDEPENDENT_AMBULATORY_CARE_PROVIDER_SITE_OTHER): Admitting: Rehabilitative and Restorative Service Providers"

## 2023-12-08 ENCOUNTER — Encounter: Payer: Self-pay | Admitting: Orthopaedic Surgery

## 2023-12-08 ENCOUNTER — Encounter: Payer: Self-pay | Admitting: Rehabilitative and Restorative Service Providers"

## 2023-12-08 ENCOUNTER — Ambulatory Visit (INDEPENDENT_AMBULATORY_CARE_PROVIDER_SITE_OTHER): Admitting: Orthopaedic Surgery

## 2023-12-08 DIAGNOSIS — M25561 Pain in right knee: Secondary | ICD-10-CM

## 2023-12-08 DIAGNOSIS — R2689 Other abnormalities of gait and mobility: Secondary | ICD-10-CM | POA: Diagnosis not present

## 2023-12-08 DIAGNOSIS — Z96651 Presence of right artificial knee joint: Secondary | ICD-10-CM

## 2023-12-08 DIAGNOSIS — R2681 Unsteadiness on feet: Secondary | ICD-10-CM | POA: Diagnosis not present

## 2023-12-08 DIAGNOSIS — R600 Localized edema: Secondary | ICD-10-CM | POA: Diagnosis not present

## 2023-12-08 DIAGNOSIS — M25661 Stiffness of right knee, not elsewhere classified: Secondary | ICD-10-CM

## 2023-12-08 MED ORDER — OXYCODONE HCL 5 MG PO TABS: 5.0000 mg | ORAL_TABLET | Freq: Two times a day (BID) | ORAL | 0 refills | Status: DC | PRN

## 2023-12-08 NOTE — Progress Notes (Signed)
 The patient is a 68 year old female who is now just past 3 months out from a right total knee arthroplasty.  6 weeks ago she was taken to the operating room for right knee manipulation under anesthesia.  She says she is doing well overall and physical therapy finished with her today.  She has a history of a left knee replacement and then more recently what we had done was a right knee replacement.  On exam her extension is full and her flexion is just past 90 degrees.  However this is her baseline and actually looks good given her body habitus.  She will continue to increase her activities as comfort allows.  She is been having some right hip pain and I did review previous x-rays of her right hip and pelvis and these were normal.  Most of her pain seems to be more IT band and trochanteric related.  She says it does hurt when she lays on that side.  At some point we may consider an injection over this area.  Will see her back in 3 months and will have a standing AP and lateral of her more recent right operative knee at that visit.  I will send in 1 last prescription for narcotics with no more refills after that.

## 2023-12-08 NOTE — Therapy (Addendum)
 OUTPATIENT PHYSICAL THERAPY TREATMENT / PROGRESS NOTE  / DISCHARGE Patient Name: Leah Hampton MRN: [id number] DOB:Apr 05, 1956, 68 y.o., female Today's Date: 12/08/2023  Progress Note Reporting Period 11/09/2023  to 12/08/2023  See note below for Objective Data and Assessment of Progress/Goals.   END OF SESSION:  PT End of Session - 12/08/23 1137     Visit Number 30    Number of Visits 30    Date for PT Re-Evaluation 12/08/23    Authorization Type [employer] Medicare Dual Complete    PT Start Time 1139    PT Stop Time 1225    PT Time Calculation (min) 46 min    Activity Tolerance Patient tolerated treatment well    Behavior During Therapy WFL for tasks assessed/performed               Past Medical History:  Diagnosis Date   Bronchitis    Chronic bronchitis (HCC)    Cystitis    Hematuria    urologice eval, Dr Alfonzo   History of chest pain    Hyperbilirubinemia    Hyperlipemia    Hypertelorism    Hypertension    Leukocytosis    Obesity    Osteoarthritis    right knee   Pre-diabetes    Sleep apnea 2012   slight   no CPAP   Vaginal Pap smear, abnormal    Vitamin D  deficiency 02/09/2018   Past Surgical History:  Procedure Laterality Date   CERVICAL POLYPECTOMY  07/15/2020   Procedure: POLYPECTOMY with Myosure;  Surgeon: Corene Coy, MD;  Location: [facility] OR;  Service: Gynecology;;   COLONOSCOPY WITH PROPOFOL  N/A 03/27/2015   Procedure: COLONOSCOPY WITH PROPOFOL ;  Surgeon: Toribio SHAUNNA Cedar, MD;  Location: [facility]COPY;  Service: Endoscopy;  Laterality: N/A;   COLONOSCOPY WITH PROPOFOL  N/A 10/02/2020   Procedure: COLONOSCOPY WITH PROPOFOL ;  Surgeon: Cedar Toribio SHAUNNA, MD;  Location: [facility];  Service: Endoscopy;  Laterality: N/A;   COLPOSCOPY N/A 07/15/2020   Procedure: COLPOSCOPY;  Surgeon: Corene Coy, MD;  Location: [facility] OR;  Service: Gynecology;  Laterality: N/A;   DILATATION & CURETTAGE/HYSTEROSCOPY WITH MYOSURE N/A 01/05/2017   Procedure:  DILATATION & CURETTAGE/HYSTEROSCOPY WITH MYOSURE;  Surgeon: Timmie Norris, MD;  Location: [facility] ORS;  Service: Gynecology;  Laterality: N/A;  PostMenopausal Bleeding   HYSTEROSCOPY WITH D & C N/A 05/22/2019   Procedure: DILATATION AND CURETTAGE /HYSTEROSCOPY, POLYPECTOMY WITH MYOSURE;  Surgeon: Corene Coy, MD;  Location: [facility] ORS;  Service: Gynecology;  Laterality: N/A;   HYSTEROSCOPY WITH D & C N/A 07/15/2020   Procedure: DILATATION AND CURETTAGE /HYSTEROSCOPY;  Surgeon: Corene Coy, MD;  Location: [facility] OR;  Service: Gynecology;  Laterality: N/A;   KNEE ARTHROSCOPY  04/2007   left knee   KNEE CLOSED REDUCTION Right 10/27/2023   Procedure: MANIPULATION, KNEE, CLOSED;  Surgeon: Vernetta Lonni GRADE, MD;  Location: [facility];  Service: Orthopedics;  Laterality: Right;   LAPAROSCOPIC GASTRIC SLEEVE RESECTION     TOTAL KNEE ARTHROPLASTY Left 10/2008   Dr Sherryn   TOTAL KNEE ARTHROPLASTY Right 08/26/2023   Procedure: ARTHROPLASTY, KNEE, TOTAL;  Surgeon: Vernetta Lonni GRADE, MD;  Location: [facility] ORS;  Service: Orthopedics;  Laterality: Right;   UPPER GI ENDOSCOPY N/A 10/14/2020   Procedure: UPPER GI ENDOSCOPY;  Surgeon: Blush Locus, MD;  Location: [facility];  Service: General;  Laterality: N/A;   Patient Active Problem List   Diagnosis Date Noted   Arthrofibrosis of knee joint, right 10/12/2023   Status post total right knee replacement  08/26/2023   Sleep disturbance 01/21/2023   Lactose intolerance 10/22/2022   S/P gastric sleeve procedure 11/04/2020   Uncomplicated asthma 11/04/2020   Gastroesophageal reflux disease 11/04/2020   Primary osteoarthritis of both hips 09/30/2020   History of total left knee replacement 07/29/2020   Endometrial polyp 07/15/2020   Low grade squamous intraepithelial lesion (LGSIL) on cervical Pap smear 07/07/2020   Upper abdominal pain 08/20/2019   Right hip pain 05/28/2019   Post-menopausal bleeding 05/16/2019   Greater trochanteric pain  syndrome of left lower extremity 03/20/2019   Type 2 diabetes mellitus without complication, without long-term current use of insulin  (HCC) 05/18/2018   Vitamin D  deficiency 02/09/2018   Onychomycosis 09/19/2017   Tinea pedis of both feet 09/19/2017   Obstructive chronic bronchitis with exacerbation (HCC) 05/23/2017   Mixed hyperlipidemia 05/01/2014   Genital herpes 05/23/2012   Stress incontinence 03/04/2012   Unilateral primary osteoarthritis, right knee 10/12/2011   CTS (carpal tunnel syndrome) 07/13/2011   OSA (obstructive sleep apnea) 12/10/2010   Severe obesity (BMI >= 40) (HCC) 07/31/2008   Essential hypertension 07/03/2008    PCP: Daryl Setter, NP  REFERRING PROVIDER: Vernetta Lonni GRADE, MD  REFERRING DIAG: M17.11 (ICD-10-CM) - Unilateral primary osteoarthritis, right knee  Rationale for Evaluation and Treatment: Rehabilitation  THERAPY DIAG:  Acute pain of right knee  Stiffness of right knee, not elsewhere classified  Localized edema  Other abnormalities of gait and mobility  Unsteadiness on feet  ONSET DATE: 08/26/23 (DOS)   SUBJECTIVE:                                                                                                                                                                                           SUBJECTIVE STATEMENT: Pt indicated overall improvement to normal around 60%.   Pt indicated having a little pain at times.   Pt indicated no waking due to knee pain.   PERTINENT HISTORY:  besity, HTN, HLD, DM, OA, Lt TKA in 2010, s/p gastric sleeve resection 2022  PAIN:  NPRS scale: 3/10 now  Pain location: Rt knee Pain description: sore and tight  Aggravating factors: Knee flexion Relieving factors: Prescription pain medication, ice  PRECAUTIONS:  None  RED FLAGS: None   WEIGHT BEARING RESTRICTIONS:  No  FALLS:  Has patient fallen in last 6 months? No  LIVING ENVIRONMENT: Lives with: lives alone Lives in:  House/apartment Stairs: Yes: External: 16 steps; can reach both Has following equipment at home: Walker - 4 wheeled  OCCUPATION:  On disability  PLOF:  Independent and Leisure: watching TV, shopping, has a fitness center at her apartment complex  PATIENT GOALS:  Improve function in knee, get back to gym  OBJECTIVE:  Note: Objective measures were completed at Evaluation unless otherwise noted.  PATIENT SURVEYS:  Patient-Specific Activity Scoring Scheme  0 represents "unable to perform." 10 represents "able to perform at prior level. 0 1 2 3 4 5 6 7 8 9  10 (Date and Score)   Activity Eval  10/11/23  11/09/2023 12/08/2023  1. Walking 2 5  7 7   2. Bending the knee(sitting, transfers) 2  5 5 7   3. Extending the knee 2 8 6 8   Score 2 6 6  7.33 avg   Total score = sum of the activity scores/number of activities Minimum detectable change (90%CI) for average score = 2 points Minimum detectable change (90%CI) for single activity score = 3 points  COGNITIVE STATUS: Within functional limits for tasks assessed   SENSATION: WFL  GAIT: 12/08/2023: Able to perform independent ambulation in clinic.  Arrived with [facility] but carrying at times.   09/08/23 Distance walked: 150' within clinic Assistive device utilized: Environmental consultant - 4 wheeled Level of assistance: Modified independence Comments: decreased stance on Rt, decr hip/knee flexion on Rt   EDEMA: 09/19/23: knee joint line: Rt: 59.5 centimeters  09/08/23  Knee joint line: Rt: 71 cm/Lt: 48.5 cm    LOWER EXTREMITY ROM:     ROM Right eval Left eval Rt 09/13/23 Rt 09/19/23 Right 09/21/23 Right  09/30/23 Rt 10/03/23 Rt 10/11/23 Rt 10/17/23 Right  10/28/23 Right 10/31/23 Right 11/04/23 Right 11/08/23 Right 11/10/23 Right 11/25/23 Right 12/08/2023  Knee flexion A: 21 P: 41 A: 108 A: 48 P: 60 A: 50 P: 60 P: 71* P 56* AA: 50 P: 58 AA:58 P: 60 AA: 60 AAROM 92 Supine AROM heel slide:  82  Supine AAROM heel slide: 92    Supine AROM  heel slide 72*, AAROM 84* Seated P: 84*  Supine  AA: 86* A: 81  (Supine) A: 89* seated, AAROM seated 90* AROM in supine heel slide 93  Knee extension A: 8 (seated LAQ) P: 0 A: 0 A: 10 P: 8 A: 10 P: 6   A: -7 P: -5 A: -5 P: -2 A: -5 -5      Seated LAQ AROM 0   (Blank rows = not tested)   LOWER EXTREMITY MMT:   09/08/2023 Not formally tested at eval; Rt knee grossly 3-/5  MMT Right 09/28/2023 Rt 10/03/23 Seated HHD 10/17/23 Rt Seated  HHD Right 11/09/2023 Right 12/08/2023  Knee flexion  18.7 22.4 19.2 25.4    Knee extension 4/5 20.5 21.3 30.6 33.1 5/5 39.1, 38.3 lbs 5/5 41, 39.2 lbs   (Blank rows = not tested)                   Today's Treatment      DATE:  12/08/2023 Therex: Nustep Lvl 6 10 mins UE/LE for ROM, endurance.  Incilne Gastroc stretch 30 sec x 3 bilateral  Seated Rt leg LAQ with end range pauses with contralateral leg movement opposite x 15  Seated Rt leg flexion with overpressure Lt leg 10 sec x 6  Supine Rt leg AROM heel slide 5 sec hold x 10  Leg press double leg 87 lbs x 15, Rt single leg x 15 37 lbs   HEP review with emphasis on flexion mobility consistent training.    Manual Seated Rt knee flexion c distraction/IR mobilization with movement.   Vaso 10 mins Rt knee in elevation 34 degrees medium compression with HEP review. SABRA  Today's Treatment      DATE: 12/01/23  Nustep L6 for 6 minutes Shuttle LE press BLEs 87# pauses into flexion and extension inc to 20x Shuttle LE press RLE only 37# 1x15 pauses into flexion and extension inc to 20x  LE on 6 inch step taps 20x each foot  LE on 6 inch step step ups with 1 hand on handrail for assist 20x each  Tandem walks in // bars x4 laps solid surface  Sidestepping in // bars x4 laps solid surface  Steps onto blue foam pad 20x      Today's Treatment      DATE: 11/25/23  Nustep L6 x10 min all four extremities for knee flexion and endurance  Shuttle LE press BLEs 87# pauses into flexion and  extension x15 Shuttle LE press RLE only 37# 1x15 pauses into flexion and extension x15  R LE on 6 inch step 10x5 second holds for flexion  Tandem walks in // bars x4 laps solid surface  Sidestepping in // bars x4 laps solid surface  Narrow BOS blue foam pad 3x30 seconds EC   Retrograde massage with LE in elevation     Today's Treatment      DATE: 11/17/23 TherEx NuStep L6 x 10 min for knee flexion and endurance RLE on 6 step with flexion holds 10 x 10 sec hold  TherAct Forward step ups onto 6 block 2x10 bil; cues to decreased UE reliance with stepping Reciprocal LAQ/hamstring curl with L3 band 2x10 with 5 sec hold  Neuro Re-Ed Sidestepping on foam x 5 laps; intermittent UE support needed Tandem walking forward x 5 laps on foam; UE support needed  Manual Seated Rt knee flexion mobilization with movement to tolerance   Today's Treatment      DATE: 11/11/23 TherEx SciFit UE/LE for 5 min fwd, 2 min bwd, seat 11 full range lvl 1.0 Elevated seat knee self flexion 10x10 Elevated seat hamstring curl at end range 2x10x3 Elevated seated LAQ eccentrics 2x10  TherActivity Sit<>stand with knee flexed at 90 deg x10 Slow march 2x10 with UE support  Standing hamstring curl 2x10 with UE support Side step over 2 hurdles x 5 by counter with bilat UE support Fwd step over 2 hurdles x 5 with UE support  Amb and step over hurdles 2x 25'  Modalities Vaso x 10 min to Rt knee in elevation; mod pressure; 34 deg    PATIENT EDUCATION:  Education details: HEP Person educated: Patient Education method: Programmer, multimedia, Facilities manager, and Handouts Education comprehension: verbalized understanding, returned demonstration, and needs further education  HOME EXERCISE PROGRAM: Access Code: [email or web address] URL: [email or web address] Date: 11/11/2023 Prepared by: Gellen April Marie Nonato  Exercises - Quad Set  - 5-10 x daily - 7 x weekly - 1 sets - 5-10 reps - 5 sec hold - Supine Bridge   - 2 x daily - 7 x weekly - 2 sets - 10 reps - 3 seconds hold - Seated Knee Flexion Extension AROM   - 5-10 x daily - 7 x weekly - 1 sets - 5-10 reps - 5 sec  hold - Seated Heel Slide  - 2 x daily - 7 x weekly - 2 sets - 10 reps - Seated Knee Flexion AAROM  - 10 x daily - 7 x weekly - 1 sets - 10 reps - 10 second hold - Sit to Stand with Armchair  - 1 x daily - 7 x weekly - 2 sets - 10 reps - Standing Marching  -  1 x daily - 7 x weekly - 2 sets - 10 reps - Standing Knee Flexion AROM with Chair Support  - 1 x daily - 7 x weekly - 2 sets - 10 reps   ASSESSMENT:  CLINICAL IMPRESSION: The patient has attended 30 visits over the course of treatment cycle.  Patient has reported overall improvement at 60%. See objective data above for updated information regarding current presentation.  Pt has continued to make smaller incremental gains with continued impairment in flexion compared to goals.  At this time, good knowledge of HEP was noted and Pt may continue to gain with use of persistent HEP throughout the next weeks and months.  Recommended trial HEP at this time due to current presentation and slower gain rate.  Long term plan to continue to be important to keep gaining.    OBJECTIVE IMPAIRMENTS: Abnormal gait, decreased balance, decreased endurance, decreased knowledge of use of DME, decreased mobility, difficulty walking, decreased ROM, decreased strength, hypomobility, increased edema, increased fascial restrictions, increased muscle spasms, and pain.    GOALS: Goals reviewed with patient? Yes  SHORT TERM GOALS: Target date: 10/06/2023  Independent with initial HEP Goal status: MET 10/03/23  2.  Rt knee ROM improved to 0-90 for improved mobility Goal status: Partially met 10/28/2023   LONG TERM GOALS: Target date: 12/08/2023  Independent with final HEP Goal status: Met 12/08/2023  2.  PSFS score improved by 3 points Goal status: MET 10/11/23  3.  Rt knee AROM improved to 0-100 for  improved function and mobility Goal status:  Met 12/08/2023  4.  Report pain < 4/10 with standing and walking for improved function Goal status: met 12/08/2023  5.  Amb without AD without significant deviations for improved community access Goal status: partially met 12/08/2023    PLAN:  PT FREQUENCY: 1-2x/week  PT DURATION: 4 additional weeks  PLANNED INTERVENTIONS: 97164- PT Re-evaluation, 97750- Physical Performance Testing, 97110-Therapeutic exercises, 97530- Therapeutic activity, 97112- Neuromuscular re-education, 97535- Self Care, 02859- Manual therapy, [phone]- Gait training, [phone]- Aquatic Therapy, [phone]- Electrical stimulation (unattended), 97016- Vasopneumatic device, Patient/Family education, Balance training, Stair training, Taping, Dry Needling, Joint mobilization, DME instructions, Cryotherapy, and Moist heat.  PLAN FOR NEXT SESSION: Trial HEP.  Return to MD.  Recert required upon return.  Discharge after 30 days inactivity.    Ozell Silvan, PT, DPT, OCS, ATC 12/08/23  12:14 PM    PHYSICAL THERAPY DISCHARGE SUMMARY  Visits from Start of Care: 30  Current functional level related to goals / functional outcomes: See note   Remaining deficits: See note   Education / Equipment: HEP  Patient goals were mostly met. Patient is being discharged due to not returning since the last visit.  Ozell Silvan, PT, DPT, OCS, ATC 02/02/24  11:35 AM

## 2023-12-19 ENCOUNTER — Telehealth: Payer: Self-pay | Admitting: Orthopaedic Surgery

## 2023-12-19 NOTE — Telephone Encounter (Signed)
 Pt wants to know if its okay for her to start exercising by walking on the treadmill.

## 2023-12-19 NOTE — Telephone Encounter (Signed)
Patient aware of the below message from [place]  

## 2023-12-23 ENCOUNTER — Encounter: Payer: Self-pay | Admitting: Family

## 2023-12-23 ENCOUNTER — Ambulatory Visit: Admitting: Family

## 2023-12-23 ENCOUNTER — Telehealth: Payer: Self-pay | Admitting: Family

## 2023-12-23 ENCOUNTER — Ambulatory Visit: Payer: Self-pay | Admitting: Family

## 2023-12-23 VITALS — BP 122/62 | HR 80 | Temp 97.7°F | Resp 16 | Ht 63.0 in | Wt 207.0 lb

## 2023-12-23 DIAGNOSIS — E119 Type 2 diabetes mellitus without complications: Secondary | ICD-10-CM

## 2023-12-23 DIAGNOSIS — J45909 Unspecified asthma, uncomplicated: Secondary | ICD-10-CM

## 2023-12-23 DIAGNOSIS — G4733 Obstructive sleep apnea (adult) (pediatric): Secondary | ICD-10-CM

## 2023-12-23 DIAGNOSIS — R300 Dysuria: Secondary | ICD-10-CM | POA: Insufficient documentation

## 2023-12-23 DIAGNOSIS — A6000 Herpesviral infection of urogenital system, unspecified: Secondary | ICD-10-CM

## 2023-12-23 DIAGNOSIS — M1711 Unilateral primary osteoarthritis, right knee: Secondary | ICD-10-CM

## 2023-12-23 DIAGNOSIS — E782 Mixed hyperlipidemia: Secondary | ICD-10-CM

## 2023-12-23 DIAGNOSIS — E559 Vitamin D deficiency, unspecified: Secondary | ICD-10-CM

## 2023-12-23 DIAGNOSIS — K219 Gastro-esophageal reflux disease without esophagitis: Secondary | ICD-10-CM | POA: Diagnosis not present

## 2023-12-23 DIAGNOSIS — N950 Postmenopausal bleeding: Secondary | ICD-10-CM

## 2023-12-23 DIAGNOSIS — E66812 Obesity, class 2: Secondary | ICD-10-CM

## 2023-12-23 DIAGNOSIS — Z6835 Body mass index (BMI) 35.0-35.9, adult: Secondary | ICD-10-CM | POA: Diagnosis not present

## 2023-12-23 DIAGNOSIS — R87612 Low grade squamous intraepithelial lesion on cytologic smear of cervix (LGSIL): Secondary | ICD-10-CM

## 2023-12-23 DIAGNOSIS — Z7985 Long-term (current) use of injectable non-insulin antidiabetic drugs: Secondary | ICD-10-CM

## 2023-12-23 DIAGNOSIS — I10 Essential (primary) hypertension: Secondary | ICD-10-CM

## 2023-12-23 LAB — BASIC METABOLIC PANEL WITH GFR
BUN: 16 mg/dL (ref 6–23)
CO2: 30 meq/L (ref 19–32)
Calcium: 9.2 mg/dL (ref 8.4–10.5)
Chloride: 104 meq/L (ref 96–112)
Creatinine, Ser: 0.78 mg/dL (ref 0.40–1.20)
GFR: 79.26 mL/min (ref >60.00)
Glucose, Bld: 95 mg/dL (ref 70–99)
Potassium: 3.8 meq/L (ref 3.5–5.1)
Sodium: 142 meq/L (ref 135–145)

## 2023-12-23 LAB — POC URINALSYSI DIPSTICK (AUTOMATED)
Glucose, UA: NEGATIVE
Ketones, UA: NEGATIVE
Nitrite, UA: NEGATIVE
Protein, UA: NEGATIVE
Spec Grav, UA: 1.02 (ref 1.010–1.025)
Urobilinogen, UA: 0.2 U/dL
pH, UA: 5 (ref 5.0–8.0)

## 2023-12-23 LAB — MICROALBUMIN / CREATININE URINE RATIO
Creatinine,U: 178.8 mg/dL
Microalb Creat Ratio: 8.9 mg/g (ref 0.0–30.0)
Microalb, Ur: 1.6 mg/dL (ref 0.0–1.9)

## 2023-12-23 LAB — VITAMIN D 25 HYDROXY (VIT D DEFICIENCY, FRACTURES): VITD: 56.4 ng/mL (ref 30.00–100.00)

## 2023-12-23 LAB — HEMOGLOBIN A1C: Hgb A1c MFr Bld: 6 % (ref 4.6–6.5)

## 2023-12-23 MED ORDER — CEPHALEXIN 500 MG PO CAPS: 500.0000 mg | ORAL_CAPSULE | Freq: Two times a day (BID) | ORAL | 0 refills | Status: AC

## 2023-12-23 MED ORDER — TIRZEPATIDE 10 MG/0.5ML ~~LOC~~ SOAJ: 10.0000 mg | SUBCUTANEOUS | 0 refills | Status: DC

## 2023-12-23 MED ORDER — CEPHALEXIN 500 MG PO CAPS: 500.0000 mg | ORAL_CAPSULE | Freq: Two times a day (BID) | ORAL | 0 refills | Status: DC

## 2023-12-23 NOTE — Telephone Encounter (Signed)
 Please let pt know that her urinalysis shows likely UTI. I sent Keflex  to [facility] for her to start.

## 2023-12-23 NOTE — Patient Instructions (Signed)
 VISIT SUMMARY:  Today, you were seen for urinary frequency and burning, which may indicate a urinary tract infection. We also discussed your right knee pain post-surgery, diabetes management, weight, asthma, blood pressure, genital herpes, and carpal tunnel syndrome.  YOUR PLAN:  SUSPECTED URINARY TRACT INFECTION: You have been experiencing urinary frequency and burning sensation. -We will collect a urine sample for urinalysis to check for infection.  RIGHT KNEE PAIN, STATUS POST TOTAL KNEE REPLACEMENT: Your knee pain has improved, and you are walking without a cane. Your leg is still swollen, and you have completed physical therapy. -You plan to resume treadmill walking next week.  TYPE 2 DIABETES MELLITUS, POST-BARIATRIC SURGERY: Your A1c was 5.7% in April. -We will recheck your A1c today.  OBESITY, POST-BARIATRIC SURGERY: Your weight has increased slightly, likely due to surgery. We agreed to increase your Mounjaro  dose for weight loss. -Increase Mounjaro  to 10 mg. -We will send the prescription to [facility]. -Mild nausea may occur initially with the dose increase.  ASTHMA: You are experiencing morning mucus production, especially after using your inhaler. -Continue using Qvar  and albuterol  as needed.  ESSENTIAL HYPERTENSION: Your blood pressure is well-controlled at 122/62. -Continue taking amlodipine  and losartan .  GENITAL HERPES ON SUPPRESSIVE THERAPY: You have no recent breakouts but are experiencing some itching and burning. -Continue taking Valtrex  daily.  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 No numbness reported. -Monitor your symptoms and let us  know if they worsen.

## 2023-12-23 NOTE — Assessment & Plan Note (Signed)
 Large Leuks on UA, symptomatic. Will begin Keflex .

## 2023-12-23 NOTE — Assessment & Plan Note (Signed)
 BP Readings from Last 3 Encounters:  12/23/23 122/62  10/27/23 135/77  08/29/23 127/67   Stable on amlodipine  and losartan , continue same.

## 2023-12-23 NOTE — Assessment & Plan Note (Signed)
 Stable on Qvar  and prn albuterol . Continue same.

## 2023-12-23 NOTE — Assessment & Plan Note (Signed)
 Not on CPAP, continue working on weight loss.

## 2023-12-23 NOTE — Assessment & Plan Note (Signed)
 Stable without meds. Monitor.

## 2023-12-23 NOTE — Telephone Encounter (Signed)
Patient notified of results and prescription.

## 2023-12-23 NOTE — Assessment & Plan Note (Signed)
 Resolved- s/p right TKR.  Ambulating without difficulty.

## 2023-12-23 NOTE — Assessment & Plan Note (Signed)
 Lab Results  Component Value Date   CHOL 154 08/23/2023   HDL 57.30 08/23/2023   LDLCALC 84 08/23/2023   TRIG 63.0 08/23/2023   CHOLHDL 3 08/23/2023   Maintained on pravachol . Stable. Continue same.

## 2023-12-23 NOTE — Assessment & Plan Note (Signed)
 Lab Results  Component Value Date   HGBA1C 5.7 08/23/2023   HGBA1C 5.9 05/24/2023   HGBA1C 5.8 01/21/2023   Lab Results  Component Value Date   MICROALBUR 0.97 07/13/2011   LDLCALC 84 08/23/2023   CREATININE 0.72 10/27/2023   Maintained on mounjaro  7.5 mg. Update A1C.

## 2023-12-23 NOTE — Progress Notes (Signed)
 Subjective:     Patient ID: Leah Hampton, female    DOB: 01/17/1956, 68 y.o.   MRN: [id number]  Chief Complaint  Patient presents with   Hypertension    Here for follow up   Diabetes    Here for follow up   Dysuria    Patient complains of burning with urination    Hypertension  Diabetes  Dysuria     Discussed the use of AI scribe software for clinical note transcription with the patient, who gave verbal consent to proceed.  History of Present Illness  Leah Hampton is a 68 year old female who presents with symptoms of urinary frequency and burning.  She experiences urinary frequency and burning for the past couple of days, raising concerns about a possible urinary tract infection.  Post right knee replacement surgery, she has improved mobility and walks without a cane. She experiences some pain but has not required pain medication since Monday. She plans to resume treadmill walking next week. She completed eleven consecutive days of physical therapy and has graduated from the program.  Her asthma symptoms include morning mucus production, especially after using her inhaler. She uses Qvar  and albuterol  as needed with no recent exacerbations.  She takes Valtrex  daily with occasional itching and burning but no sores or recent breakouts. No issues with reflux or heartburn. 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   Hyperbilirubinemia    Hyperlipemia    Hypertelorism    Hypertension     Leukocytosis    Obesity    Osteoarthritis    right knee   Pre-diabetes    Sleep apnea 2012   slight   no CPAP   Vaginal Pap smear, abnormal    Vitamin D  deficiency 02/09/2018    Past Surgical History:  Procedure Laterality Date   CERVICAL POLYPECTOMY  07/15/2020   Procedure: POLYPECTOMY with Myosure;  Surgeon: Corene Coy, MD;  Location: [facility] OR;  Service: Gynecology;;   COLONOSCOPY WITH PROPOFOL  N/A 03/27/2015   Procedure: COLONOSCOPY WITH PROPOFOL ;  Surgeon: Toribio SHAUNNA Cedar, MD;  Location: [facility];  Service: Endoscopy;  Laterality: N/A;   COLONOSCOPY WITH PROPOFOL  N/A 10/02/2020   Procedure: COLONOSCOPY WITH PROPOFOL ;  Surgeon: Cedar Toribio SHAUNNA, MD;  Location: [facility];  Service: Endoscopy;  Laterality: N/A;   COLPOSCOPY N/A 07/15/2020   Procedure: COLPOSCOPY;  Surgeon: Corene Coy, MD;  Location: [facility] OR;  Service: Gynecology;  Laterality: N/A;   DILATATION & CURETTAGE/HYSTEROSCOPY WITH MYOSURE N/A 01/05/2017   Procedure: DILATATION & CURETTAGE/HYSTEROSCOPY WITH MYOSURE;  Surgeon: Timmie Norris, MD;  Location: [facility] OR[facility];  Service: Gynecology;  Laterality: N/A;  PostMenopausal Bleeding   HYSTEROSCOPY WITH D & C N/A 05/22/2019   Procedure: DILATATION AND CURETTAGE /HYSTEROSCOPY, POLYPECTOMY WITH MYOSURE;  Surgeon: Corene Coy, MD;  Location: [facility];  Service: Gynecology;  Laterality: N/A;   HYSTEROSCOPY WITH D & C N/A 07/15/2020   Procedure: DILATATION AND  CURETTAGE /HYSTEROSCOPY;  Surgeon: Corene Coy, MD;  Location: [facility] OR;  Service: Gynecology;  Laterality: N/A;   KNEE ARTHROSCOPY  04/2007   left knee   KNEE CLOSED REDUCTION Right 10/27/2023   Procedure: MANIPULATION, KNEE, CLOSED;  Surgeon: Vernetta Lonni GRADE, MD;  Location: [facility];  Service: Orthopedics;  Laterality: Right;   LAPAROSCOPIC GASTRIC SLEEVE RESECTION     TOTAL KNEE ARTHROPLASTY Left 10/2008   Dr Sherryn   TOTAL KNEE ARTHROPLASTY Right 08/26/2023   Procedure:  ARTHROPLASTY, KNEE, TOTAL;  Surgeon: Vernetta Lonni GRADE, MD;  Location: [facility];  Service: Orthopedics;  Laterality: Right;   UPPER GI ENDOSCOPY N/A 10/14/2020   Procedure: UPPER GI ENDOSCOPY;  Surgeon: Tanda Locus, MD;  Location: [facility];  Service: General;  Laterality: N/A;    Family History  Problem Relation Age of Onset   Heart failure Mother    Arthritis Mother    Hypertension Mother    Stroke Mother    Diabetes Mellitus II Mother    Heart attack Father 62   Diabetes Other    Arthritis Other    Stroke Other    Coronary artery disease Other    Hypertension Sister        x 2   CVA Sister    CAD Sister 79   Diabetes Mellitus II Sister    Cirrhosis Brother    Alcohol abuse Brother    Colon cancer Neg Hx     Social History   Socioeconomic History   Marital status: Single    Spouse name: Not on file   Number of children: 2   Years of education: Not on file   Highest education level: GED or equivalent  Occupational History   Occupation: disabled  Tobacco Use   Smoking status: Never   Smokeless tobacco: Never  Vaping Use   Vaping status: Never Used  Substance and Sexual Activity   Alcohol use: No   Drug use: No   Sexual activity: Not Currently  Other Topics Concern   Not on file  Social History Narrative   Regular exercise:  No   Lives alone   Has 2 grown sons, both local.  1 grandson   On disability due to her knee pain   Completed 12th grade   Enjoys television   No pets.    Social Drivers of Corporate investment banker Strain: Low Risk  (12/05/2023)   Overall Financial Resource Strain (CARDIA)    Difficulty of Paying Living Expenses: Not very hard  Food Insecurity: No Food Insecurity (12/05/2023)   Hunger Vital Sign    Worried About Running Out of Food in the Last Year: Never true    Ran Out of Food in the Last Year: Never true  Transportation Needs: No Transportation Needs (12/06/2023)   PRAPARE - Administrator, Civil Service  (Medical): No    Lack of Transportation (Non-Medical): No  Physical Activity: Insufficiently Active (12/05/2023)   Exercise Vital Sign    Days of Exercise per Week: 3 days    Minutes of Exercise per Session: 20 min  Stress: No Stress Concern Present (12/05/2023)   [employer] of Occupational Health - Occupational Stress Questionnaire    Feeling of Stress: Only a little  Social Connections: Unknown (12/06/2023)   Social Connection and Isolation Panel    Frequency of Communication with Friends and Family: Not on file    Frequency of Social Gatherings with Friends and Family: Not on file  Attends Religious Services: Not on file    Active Member of Clubs or Organizations: Not on file    Attends Club or Organization Meetings: Not on file    Marital Status: Widowed  Recent Concern: Social Connections - Moderately Isolated (12/05/2023)   Social Connection and Isolation Panel    Frequency of Communication with Friends and Family: More than three times a week    Frequency of Social Gatherings with Friends and Family: More than three times a week    Attends Religious Services: Patient declined    Database administrator or Organizations: Yes    Attends Banker Meetings: Patient declined    Marital Status: Never married  Intimate Partner Violence: Not At Risk (12/06/2023)   Humiliation, Afraid, Rape, and Kick questionnaire    Fear of Current or Ex-Partner: No    Emotionally Abused: No    Physically Abused: No    Sexually Abused: No    Outpatient Medications Prior to Visit  Medication Sig Dispense Refill   albuterol  (VENTOLIN  HFA) 108 (90 Base) MCG/ACT inhaler Inhale 2 puffs into the lungs every 6 (six) hours as needed for wheezing or shortness of breath. 34 g 1   amLODipine  (NORVASC ) 5 MG tablet Take 1 tablet (5 mg total) by mouth daily. 100 tablet 2   beclomethasone (QVAR  REDIHALER) 80 MCG/ACT inhaler USE 1 INHALATION BY MOUTH INTO  THE LUNGS TWICE DAILY 21.2 g 1    Calcium-Vitamin D -Vitamin K (VIACTIV CALCIUM PLUS D) 650-12.5-40 MG-MCG-MCG CHEW Chew 1 tablet by mouth in the morning, at noon, and at bedtime.     diclofenac  Sodium (VOLTAREN ) 1 % GEL Apply 2 g topically daily as needed (pain).     FIBER GUMMIES PO Take 1-3 tablets by mouth daily as needed (regularity.). Metamucil Fiber Gummies for Adults     losartan  (COZAAR ) 100 MG tablet Take 1 tablet (100 mg total) by mouth daily. 100 tablet 1   Multiple Vitamins-Minerals (BARIATRIC MULTIVITAMINS PO) Take 1 capsule by mouth daily in the afternoon.     oxyCODONE  (OXY IR/ROXICODONE ) 5 MG immediate release tablet Take 1 tablet (5 mg total) by mouth 2 (two) times daily as needed for moderate pain (pain score 4-6) (pain score 4-6). 30 tablet 0   pravastatin  (PRAVACHOL ) 80 MG tablet Take 1 tablet (80 mg total) by mouth daily. 100 tablet 2   valACYclovir  (VALTREX ) 1000 MG tablet Take 1 tablet (1,000 mg total) by mouth daily. 100 tablet 2   tirzepatide  (MOUNJARO ) 7.5 MG/0.5ML Pen Inject 7.5 mg into the skin once a week. 6 mL 1   methocarbamol  (ROBAXIN ) 500 MG tablet Take 1 tablet (500 mg total) by mouth every 6 (six) hours as needed for muscle spasms. 30 tablet 1   No facility-administered medications prior to visit.    Allergies  Allergen Reactions   Sulfamethoxazole -Trimethoprim [Bactrim] Itching   Voltaren  [Diclofenac ] Diarrhea   Codeine Nausea And Vomiting and Anxiety    Review of Systems  Genitourinary:  Positive for dysuria.       Objective:    Physical Exam Constitutional:      Appearance: She is well-developed.  Cardiovascular:     Rate and Rhythm: Normal rate and regular rhythm.     Heart sounds: Normal heart sounds. No murmur heard. Pulmonary:     Effort: Pulmonary effort is normal. No respiratory distress.     Breath sounds: Normal breath sounds. No wheezing.  Psychiatric:        Behavior: Behavior normal.  Thought Content: Thought content normal.        Judgment: Judgment  normal.      BP 122/62 (BP Location: Right Arm, Patient Position: Sitting, Cuff Size: Large)   Pulse 80   Temp 97.7 F (36.5 C) (Oral)   Resp 16   Ht 5' 3 (1.6 m)   Wt 207 lb (93.9 kg)   LMP 11/10/2011   SpO2 99%   BMI 36.67 kg/m  Wt Readings from Last 3 Encounters:  12/23/23 207 lb (93.9 kg)  12/06/23 203 lb (92.1 kg)  10/27/23 205 lb (93 kg)       Assessment & Plan:   Problem List Items Addressed This Visit       Unprioritized   Vitamin D  deficiency - Primary   Continues bariatric vitamins, update level.      Relevant Orders   Vitamin D  (25 hydroxy)   RESOLVED: Unilateral primary osteoarthritis, right knee   Resolved- s/p right TKR.  Ambulating without difficulty.       Uncomplicated asthma   Stable on Qvar  and prn albuterol . Continue same.      Type 2 diabetes mellitus without complication, without long-term current use of insulin  [facility])   Lab Results  Component Value Date   HGBA1C 5.7 08/23/2023   HGBA1C 5.9 05/24/2023   HGBA1C 5.8 01/21/2023   Lab Results  Component Value Date   MICROALBUR 0.97 07/13/2011   LDLCALC 84 08/23/2023   CREATININE 0.72 10/27/2023   Maintained on mounjaro  7.5 mg. Update A1C.       Relevant Medications   tirzepatide  (MOUNJARO ) 10 MG/0.5ML Pen   Severe obesity (BMI >= 40) (HCC)   Relevant Medications   tirzepatide  (MOUNJARO ) 10 MG/0.5ML Pen   Post-menopausal bleeding   Back in 2020- had endometrial biopsy.   A. ENDOMETRIUM, BIOPSY:  -  Benign endometrium with tubal metaplasia  -  No hyperplasia or malignancy identified       OSA (obstructive sleep apnea)   Not on CPAP, continue working on weight loss.       Mixed hyperlipidemia   Lab Results  Component Value Date   CHOL 154 08/23/2023   HDL 57.30 08/23/2023   LDLCALC 84 08/23/2023   TRIG 63.0 08/23/2023   CHOLHDL 3 08/23/2023   Maintained on pravachol . Stable. Continue same.       Low grade squamous intraepithelial lesion (LGSIL) on cervical Pap  smear   Pt advised to schedule follow up with her GYN.      Genital herpes   Has had some itching but no sores, continues valtrex  1000mg .       Relevant Medications   cephALEXin  (KEFLEX ) 500 MG capsule   RESOLVED: Gastroesophageal reflux disease   Stable without meds.  Monitor.       Essential hypertension   BP Readings from Last 3 Encounters:  12/23/23 122/62  10/27/23 135/77  08/29/23 127/67   Stable on amlodipine  and losartan , continue same.      Dysuria   Large Leuks on UA, symptomatic. Will begin Keflex .        Relevant Medications   cephALEXin  (KEFLEX ) 500 MG capsule   Other Relevant Orders   POCT Urinalysis Dipstick (Automated) (Completed)   Urine Culture   Class 2 severe obesity with serious comorbidity and body mass index (BMI) of 35.0 to 35.9 in adult (HCC)   Weight up a bit. Will increase mounjaro  from 7.5 mg to 10mg . She plans to start walking on the treadmill now that her  ortho has cleared her.       Relevant Medications   tirzepatide  (MOUNJARO ) 10 MG/0.5ML Pen   Other Relevant Orders   Urine Microalbumin w/creat. ratio   HgB A1c   Basic Metabolic Panel (BMET)    I have discontinued Alaisha M. Delange's tirzepatide  and methocarbamol . I am also having her start on tirzepatide  and cephALEXin . Additionally, I am having her maintain her Multiple Vitamins-Minerals (BARIATRIC MULTIVITAMINS PO), Viactiv Calcium Plus D, FIBER GUMMIES PO, albuterol , Qvar  RediHaler, losartan , pravastatin , amLODipine , valACYclovir , diclofenac  Sodium, and oxyCODONE .  Meds ordered this encounter  Medications   tirzepatide  (MOUNJARO ) 10 MG/0.5ML Pen    Sig: Inject 10 mg into the skin once a week.    Dispense:  6 mL    Refill:  0    Supervising Provider:   DOMENICA BLACKBIRD A [[id number]]   cephALEXin  (KEFLEX ) 500 MG capsule    Sig: Take 1 capsule (500 mg total) by mouth 2 (two) times daily for 5 days.    Dispense:  10 capsule    Refill:  0    Supervising Provider:   DOMENICA BLACKBIRD A [[id number]]

## 2023-12-23 NOTE — Assessment & Plan Note (Signed)
 Pt advised to schedule follow up with her GYN.

## 2023-12-23 NOTE — Assessment & Plan Note (Signed)
 Weight up a bit. Will increase mounjaro  from 7.5 mg to 10mg . She plans to start walking on the treadmill now that her ortho has cleared her.

## 2023-12-23 NOTE — Assessment & Plan Note (Signed)
 Continues bariatric vitamins, update level.

## 2023-12-23 NOTE — Telephone Encounter (Signed)
 Rx resent   Copied from CRM [id number]. Topic: Clinical - Prescription Issue >> Dec 23, 2023  2:41 PM Winona R wrote: Pt calling as her rx cephALEXin  (KEFLEX ) 500 MG capsule [[id number]] E-Prescribing Status: Transmission to pharmacy failed (12/23/2023 10:46 AM EDT). Pharmacy closes at 6pm and closed on the weekends

## 2023-12-23 NOTE — Assessment & Plan Note (Signed)
 Has had some itching but no sores, continues valtrex  1000mg .

## 2023-12-23 NOTE — Assessment & Plan Note (Signed)
 Back in 2020- had endometrial biopsy.   A. ENDOMETRIUM, BIOPSY:  -  Benign endometrium with tubal metaplasia  -  No hyperplasia or malignancy identified

## 2023-12-24 LAB — URINE CULTURE
MICRO NUMBER:: 16806271
SPECIMEN QUALITY:: ADEQUATE

## 2024-01-03 ENCOUNTER — Ambulatory Visit
Admission: RE | Admit: 2024-01-03 | Discharge: 2024-01-03 | Disposition: A | Discharge status: Home / Self Care | Source: Ambulatory Visit | Attending: Family | Admitting: Family

## 2024-01-03 ENCOUNTER — Telehealth: Payer: Self-pay | Admitting: Neurology

## 2024-01-03 DIAGNOSIS — Z1231 Encounter for screening mammogram for malignant neoplasm of breast: Secondary | ICD-10-CM | POA: Diagnosis not present

## 2024-01-03 NOTE — Telephone Encounter (Signed)
 Copied from CRM [id number]. Topic: Clinical - Medication Question >> Jan 03, 2024  2:09 PM Franky GRADE wrote: Reason for CRM: Patient's tirzepatide  (MOUNJARO ) 10 MG/0.5ML Pen [[id number]] was increased but she still have the 7.5 MG left and wants to know if  she should finish before starting the new dose.

## 2024-01-03 NOTE — Telephone Encounter (Signed)
 Yes, she should finish the 7.5 first.

## 2024-01-04 NOTE — Telephone Encounter (Signed)
 Patient notified to finish the 7.5 dose before starting higher dose. She verbalized understanding.

## 2024-01-11 DIAGNOSIS — K439 Ventral hernia without obstruction or gangrene: Secondary | ICD-10-CM | POA: Diagnosis not present

## 2024-01-11 DIAGNOSIS — I10 Essential (primary) hypertension: Secondary | ICD-10-CM | POA: Diagnosis not present

## 2024-01-11 DIAGNOSIS — E118 Type 2 diabetes mellitus with unspecified complications: Secondary | ICD-10-CM | POA: Diagnosis not present

## 2024-01-11 DIAGNOSIS — Z9884 Bariatric surgery status: Secondary | ICD-10-CM | POA: Diagnosis not present

## 2024-01-11 NOTE — Progress Notes (Signed)
 PROVIDER:  ERIC DARALYN BLUSH, MD  MRN: [id number] DOB: 1956-04-12 DATE OF ENCOUNTER: 01/11/2024 Subjective   Chief Complaint: Return weight loss ( - 6 mo Bari)    Bariatric History: MAKESHA Hampton is  status post laparoscopic sleeve gastrectomy on 10/14/2020 .  Initial visit weight 315.8 pounds.  Preoperative weight 304.6 pounds.  At a visit on 12/22/22  she had a body weight of 229lb.   Comorbidities at the time of surgery included Severe obesity (BMI 55)   Hyperlipidemia   Essential hypertension   BACK PAIN, LUMBAR   OSA (obstructive sleep apnea)   Osteoarthritis of right knee   Diabetes type 2, controlled (HCC) History of Present Illness Leah Hampton is a 68 year old female who presents for long-term follow-up after sleeve gastrectomy.  She underwent a sleeve gastrectomy on Oct 14, 2020, and since her last visit in February, she has had a knee replacement surgery performed by Dr. Vernetta. She has completed physical therapy and is now ambulating without a cane.  Her current medications include a multivitamin and calcium supplements. She is managing her diabetes with Mounjaro , currently at a dose of 7.5 mg, with plans to increase to 10 mg. She experiences some difficulty with bowel movements and has MiraLAX  available if needed. No abdominal pain, constipation, or diarrhea, but she notes irregular bowel movements.  Her nutrition includes approximately three bottles of water  per day, and she acknowledges not consuming as much protein as recommended. She has recently started going to the gym and uses 5 and 10-pound dumbbells for resistance training.  She has a ventral hernia. Her BMI has decreased from 40 in February to 36.6 currently, with a total weight loss of nearly 100 pounds.   MBSAQIP Long Term Follow Up Questionnaire  (6 months post op and onward)  Diabetes mellitus, on medication: (If diet controlled or no medications, mark no): mounjaro  GERD, on daily medication:  no Hyperlipidemia, on medication: no Hypertension, on medication: yes *Number of medications to treat hypertension:1 Sleep apnea requiring CPAP: no   Readmission: no Reoperation: no Intervention Related to Bariatric Procedure: no  Review of Systems: A complete review of systems was obtained from the patient.  I have reviewed this information and discussed as appropriate with the patient.  See HPI as well for other ROS.  ROS    Medical History: Past Medical History:  Diagnosis Date  . Arthritis   . GERD (gastroesophageal reflux disease)   . Hyperlipidemia   . Hypertension     Patient Active Problem List  Diagnosis  . Diabetes type 2, controlled (CMS/HHS-HCC)  . OSA (obstructive sleep apnea)    Past Surgical History:  Procedure Laterality Date  . gastric sleeve N/A      Allergies  Allergen Reactions  . Sulfamethoxazole -Trimethoprim Itching  . Codeine Anxiety and Nausea And Vomiting    Current Outpatient Medications on File Prior to Visit  Medication Sig Dispense Refill  . albuterol  90 mcg/actuation inhaler USE 2 INHALATIONS BY MOUTH  EVERY 6 HOURS AS NEEDED FOR WHEEZING OR SHORTNESS OF  BREATH    . amLODIPine  (NORVASC ) 5 MG tablet Take 5 mg by mouth once daily    . diclofenac  (VOLTAREN ) 1 % topical gel Apply 2 g topically 4 (four) times daily    . FLOVENT  HFA 110 mcg/actuation inhaler     . fluticasone  propionate (FLOVENT  HFA) 110 mcg/actuation inhaler     . losartan  (COZAAR ) 100 MG tablet Take 100 mg by mouth once daily    .  MOUNJARO  7.5 mg/0.5 mL pen injector Inject 7.5 mg subcutaneously once a week    . multivitamin with minerals tablet Take by mouth    . pravastatin  (PRAVACHOL ) 80 MG tablet Take 1 tablet by mouth once daily    . valACYclovir  (VALTREX ) 1000 MG tablet Take 1 tablet by mouth once daily    . omeprazole  (PRILOSEC) 20 MG DR capsule Take 1 capsule by mouth once daily (Patient not taking: Reported on 07/14/2023)    . ondansetron  (ZOFRAN -ODT) 4 MG  disintegrating tablet Take by mouth (Patient not taking: Reported on 07/14/2023)    . polyethylene glycol (MIRALAX ) powder Take by mouth (Patient not taking: Reported on 07/14/2023)     No current facility-administered medications on file prior to visit.    Family History  Problem Relation Age of Onset  . Obesity Sister   . Diabetes Sister      Social History   Tobacco Use  Smoking Status Never  Smokeless Tobacco Never     Social History   Socioeconomic History  . Marital status: Single  Tobacco Use  . Smoking status: Never  . Smokeless tobacco: Never  Vaping Use  . Vaping status: Never Used  Substance and Sexual Activity  . Alcohol use: Never  . Drug use: Never   Social Drivers of Corporate investment banker Strain: Low Risk  (12/05/2023)   Received from [facility]   Overall Financial Resource Strain (CARDIA)   . How hard is it for you to pay for the very basics like food, housing, medical care, and heating?: Not very hard  Food Insecurity: No Food Insecurity (12/05/2023)   Received from [facility]   Hunger Vital Sign   . Within the past 12 months, you worried that your food would run out before you got the money to buy more.: Never true   . Within the past 12 months, the food you bought just didn't last and you didn't have money to get more.: Never true  Transportation Needs: No Transportation Needs (12/06/2023)   Received from [facility] - Transportation   . In the past 12 months, has lack of transportation kept you from medical appointments or from getting medications?: No   . In the past 12 months, has lack of transportation kept you from meetings, work, or from getting things needed for daily living?: No  Physical Activity: Insufficiently Active (12/05/2023)   Received from [facility]   Exercise Vital Sign   . On average, how many days per week do you engage in moderate to strenuous exercise (like a brisk walk)?: 3 days   . On average, how many  minutes do you engage in exercise at this level?: 20 min  Stress: No Stress Concern Present (12/05/2023)   Received from [facility] - Occupational Stress Questionnaire   . Do you feel stress - tense, restless, nervous, or anxious, or unable to sleep at night because your mind is troubled all the time - these days?: Only a little  Social Connections: Unknown (12/06/2023)   Received from [facility]   Social Connection and Isolation Panel   . Are you married, widowed, divorced, separated, never married, or living with a partner?: Widowed  Housing Stability: Low Risk  (08/26/2023)   Received from [facility] Stability Vital Sign   . Unable to Pay for Housing in the Last Year: No   . Number of Times Moved in  the Last Year: 0   . Homeless in the Last Year: No    Objective:    Vitals:   01/11/24 1131 01/11/24 1132  BP: 115/64   Pulse: 84   Resp: 18   Temp: 37.1 C (98.7 F)   SpO2: 98%   Weight: 93.7 kg (206 lb 9.6 oz)   Height: 160 cm (5' 3)   PainSc:  0-No pain    Body mass index is 36.6 kg/m. Wt Readings from Last 10 Encounters:  01/11/24 93.7 kg (206 lb 9.6 oz)  07/14/23 99.7 kg (219 lb 12.8 oz)  12/22/22 (!) 104 kg (229 lb 3.2 oz)  06/23/22 (!) 109.6 kg (241 lb 9.6 oz)  02/26/22 (!) 109.8 kg (242 lb)  12/31/21 (!) 109.3 kg (241 lb)  10/07/21 (!) 109.3 kg (241 lb)  07/08/21 (!) 111.5 kg (245 lb 12.8 oz)  04/02/21 (!) 115.8 kg (255 lb 6.4 oz)  12/02/20 (!) 125.9 kg (277 lb 9.6 oz)    PE Chaperone note: No sensitive exam performed  Gen: alert, NAD, non-toxic appearing Pupils: equal, no scleral icterus Pulm:  symmetric chest rise Abd: soft, nontender, nondistended. old trocar sites. No cellulitis. Ventral supraumbilical hernia Ext: no edema,  Skin: no rash, no jaundice   Labs, Imaging and Diagnostic Testing: PCP note 12/23/23 Labs from December 23, 2023-urine culture, basic metabolic panel, hemoglobin A1c, vitamin  D Labs from October 27, 2023 CBC and be met  Reviewed CT from August 10, 2021-supraumbilical hernia containing nonobstructed transverse colon measuring about 5 x 4-1/2 cm  Assessment and Plan:  Diagnoses and all orders for this visit:  Obesity, Class II, BMI 35-39.9  Status post laparoscopic sleeve gastrectomy  Ventral hernia without obstruction or gangrene  Essential hypertension  Controlled type 2 diabetes mellitus with complication, without long-term current use of insulin  (CMS/HHS-HCC)      Assessment & Plan Status post sleeve gastrectomy for severe obesity Status post sleeve gastrectomy performed on Oct 14, 2020, with significant weight loss and reduction in BMI from 40 in February to 36.6 currently. Continued weight management is essential. - Continue Mounjaro  for diabetes management and weight control - Encourage intake of 60 grams of protein daily through protein shakes, [place] yogurt, or other sources - Recommend resistance training with dumbbells or bands - Provide handouts on exercises with dumbbells  Ventral hernia without obstruction or gangrene Presence of a ventral hernia in the upper abdomen, soft upon examination. Consideration for surgical intervention now that BMI is improved. Potential for minimally invasive robotic surgery. - Refer to Dr. Polly or Dr. Lyndel for evaluation and potential surgical intervention - Consider CT scan of abdominal wall for surgical planning  Diabetes type II obese Type II diabetes managed with Mounjaro , current dose 7.5 mg with plans to increase to 10 mg. Monitoring for gastrointestinal side effects such as constipation or diarrhea. - Continue Mounjaro  with planned increase to 10 mg - Advise use of stool softeners or MiraLAX  for constipation if needed  Hypertension Hypertension management not directly addressed in this encounter. Current medication regimen includes Amlodipine  and Losartan .  Goals of Care No discussion of  life support, code status, or other life-sustaining treatments during this encounter.  This patient encounter took 30 minutes today to perform the following: take history, perform exam, review outside records, interpret imaging, counsel the patient on their diagnosis and document encounter, findings & plan in the EHR  Return in about 9 months (around 10/10/2024) for LTF-Bariatrics.  This note has been created using  automated tools and reviewed for accuracy by ERIC MCADAMS Szymczak.   Camellia HERO. Robbs MD FACS General, Minimally Invasive, & Bariatric Surgery

## 2024-01-16 ENCOUNTER — Other Ambulatory Visit: Payer: Self-pay | Admitting: Family

## 2024-01-31 ENCOUNTER — Ambulatory Visit: Payer: Self-pay | Admitting: General Surgery

## 2024-01-31 DIAGNOSIS — K439 Ventral hernia without obstruction or gangrene: Secondary | ICD-10-CM | POA: Diagnosis not present

## 2024-02-01 ENCOUNTER — Other Ambulatory Visit: Payer: Self-pay | Admitting: General Surgery

## 2024-02-01 DIAGNOSIS — K439 Ventral hernia without obstruction or gangrene: Secondary | ICD-10-CM

## 2024-02-09 ENCOUNTER — Other Ambulatory Visit

## 2024-02-10 ENCOUNTER — Ambulatory Visit
Admission: RE | Admit: 2024-02-10 | Discharge: 2024-02-10 | Disposition: A | Discharge status: Home / Self Care | Source: Ambulatory Visit | Attending: General Surgery | Admitting: General Surgery

## 2024-02-10 DIAGNOSIS — K5730 Diverticulosis of large intestine without perforation or abscess without bleeding: Secondary | ICD-10-CM | POA: Diagnosis not present

## 2024-02-10 DIAGNOSIS — K439 Ventral hernia without obstruction or gangrene: Secondary | ICD-10-CM

## 2024-02-10 DIAGNOSIS — K8020 Calculus of gallbladder without cholecystitis without obstruction: Secondary | ICD-10-CM | POA: Diagnosis not present

## 2024-02-10 MED ORDER — IOPAMIDOL (ISOVUE-300) INJECTION 61%
100.0000 mL | Freq: Once | INTRAVENOUS | Status: AC | PRN
Start: 2024-02-10 — End: 2024-02-10
  Administered 2024-02-10: 100 mL via INTRAVENOUS

## 2024-02-14 ENCOUNTER — Ambulatory Visit: Payer: Self-pay | Admitting: General Surgery

## 2024-02-28 ENCOUNTER — Other Ambulatory Visit: Payer: Self-pay | Admitting: Family

## 2024-02-28 DIAGNOSIS — E119 Type 2 diabetes mellitus without complications: Secondary | ICD-10-CM

## 2024-03-08 ENCOUNTER — Other Ambulatory Visit (INDEPENDENT_AMBULATORY_CARE_PROVIDER_SITE_OTHER): Payer: Self-pay

## 2024-03-08 ENCOUNTER — Ambulatory Visit: Admitting: Orthopaedic Surgery

## 2024-03-08 ENCOUNTER — Encounter: Payer: Self-pay | Admitting: Orthopaedic Surgery

## 2024-03-08 DIAGNOSIS — Z96651 Presence of right artificial knee joint: Secondary | ICD-10-CM

## 2024-03-08 NOTE — Progress Notes (Signed)
 The patient is now just past 6 months status post a right total knee replacement to treat significant right knee pain and arthritis.  Her left knee replacement was in April of this year and then in June we did have to perform a right knee manipulation under anesthesia due to the lack of motion of the knee.  Her left knee was done elsewhere.  She is now walking without an assistive device and reports good range of motion and strength and she says she is pleased overall.  On exam her extension is almost full of both knees.  Her flexion of her more recent right knee is to past 90 degrees at about 95 degrees or so and her left total knee flexes to about 100 degrees or maybe just less than that.  An AP and lateral of the right knee shows a well-seated total knee arthroplasty with no complicating features.  From my standpoint the next time when he is here is in 6 months.  Will have a standing AP and lateral of both her knees at that visit.  She started to have some issues she says with her left knee.

## 2024-03-16 ENCOUNTER — Ambulatory Visit: Admitting: Podiatry

## 2024-03-19 ENCOUNTER — Ambulatory Visit: Admitting: Podiatry

## 2024-03-19 ENCOUNTER — Encounter: Payer: Self-pay | Admitting: Radiology

## 2024-03-27 ENCOUNTER — Ambulatory Visit: Payer: Self-pay | Admitting: Family

## 2024-03-27 ENCOUNTER — Ambulatory Visit (INDEPENDENT_AMBULATORY_CARE_PROVIDER_SITE_OTHER): Admitting: Family

## 2024-03-27 VITALS — BP 108/54 | HR 62 | Temp 97.8°F | Resp 16 | Ht 63.0 in | Wt 208.0 lb

## 2024-03-27 DIAGNOSIS — E782 Mixed hyperlipidemia: Secondary | ICD-10-CM

## 2024-03-27 DIAGNOSIS — Z7985 Long-term (current) use of injectable non-insulin antidiabetic drugs: Secondary | ICD-10-CM

## 2024-03-27 DIAGNOSIS — Z96651 Presence of right artificial knee joint: Secondary | ICD-10-CM

## 2024-03-27 DIAGNOSIS — A6000 Herpesviral infection of urogenital system, unspecified: Secondary | ICD-10-CM

## 2024-03-27 DIAGNOSIS — I10 Essential (primary) hypertension: Secondary | ICD-10-CM | POA: Diagnosis not present

## 2024-03-27 DIAGNOSIS — G4733 Obstructive sleep apnea (adult) (pediatric): Secondary | ICD-10-CM | POA: Diagnosis not present

## 2024-03-27 DIAGNOSIS — E66812 Obesity, class 2: Secondary | ICD-10-CM

## 2024-03-27 DIAGNOSIS — Z6841 Body Mass Index (BMI) 40.0 and over, adult: Secondary | ICD-10-CM

## 2024-03-27 DIAGNOSIS — J45909 Unspecified asthma, uncomplicated: Secondary | ICD-10-CM

## 2024-03-27 DIAGNOSIS — Z23 Encounter for immunization: Secondary | ICD-10-CM

## 2024-03-27 DIAGNOSIS — Z6835 Body mass index (BMI) 35.0-35.9, adult: Secondary | ICD-10-CM

## 2024-03-27 DIAGNOSIS — E119 Type 2 diabetes mellitus without complications: Secondary | ICD-10-CM

## 2024-03-27 DIAGNOSIS — J449 Chronic obstructive pulmonary disease, unspecified: Secondary | ICD-10-CM

## 2024-03-27 LAB — COMPREHENSIVE METABOLIC PANEL WITH GFR
ALT: 28 U/L (ref 0–35)
AST: 28 U/L (ref 0–37)
Albumin: 3.9 g/dL (ref 3.5–5.2)
Alkaline Phosphatase: 68 U/L (ref 39–117)
BUN: 14 mg/dL (ref 6–23)
CO2: 31 meq/L (ref 19–32)
Calcium: 9.4 mg/dL (ref 8.4–10.5)
Chloride: 106 meq/L (ref 96–112)
Creatinine, Ser: 0.75 mg/dL (ref 0.40–1.20)
GFR: 82.92 mL/min (ref >60.00)
Glucose, Bld: 77 mg/dL (ref 70–99)
Potassium: 3.9 meq/L (ref 3.5–5.1)
Sodium: 143 meq/L (ref 135–145)
Total Bilirubin: 0.4 mg/dL (ref 0.2–1.2)
Total Protein: 6.8 g/dL (ref 6.0–8.3)

## 2024-03-27 LAB — HEMOGLOBIN A1C: Hgb A1c MFr Bld: 5.5 % (ref 4.6–6.5)

## 2024-03-27 NOTE — Assessment & Plan Note (Signed)
 Feels much better now and she has started back exercising.

## 2024-03-27 NOTE — Assessment & Plan Note (Signed)
 Lab Results  Component Value Date   HGBA1C 6.0 12/23/2023   HGBA1C 5.7 08/23/2023   HGBA1C 5.9 05/24/2023   Lab Results  Component Value Date   MICROALBUR 1.6 12/23/2023   LDLCALC 84 08/23/2023   CREATININE 0.78 12/23/2023   Wt Readings from Last 3 Encounters:  03/27/24 208 lb (94.3 kg)  12/23/23 207 lb (93.9 kg)  12/06/23 203 lb (92.1 kg)   Stable on mounjaro , update A1C.

## 2024-03-27 NOTE — Assessment & Plan Note (Signed)
Counseled on weight loss. 

## 2024-03-27 NOTE — Progress Notes (Addendum)
 Established Patient Office Visit  Patient ID: Leah Hampton, female    DOB: Oct 06, 1955  Age: 68 y.o. MRN: [id number] PCP: Daryl Setter, NP  Chief Complaint  Patient presents with   Hypertension    Her for follow up    Subjective:     Hypertension    Discussed the use of AI scribe software for clinical note transcription with the patient, who gave verbal consent to proceed.  History of Present Illness Leah Hampton is a 68 year old female who presents for a routine follow-up visit.  She takes losartan  100 mg and amlodipine  5 mg daily for blood pressure. No dizziness is reported.  She has been walking well since her knee surgery and exercises five days a week, including using the elliptical at her apartment's fitness center. She is working on weight loss for her sleep apnea and her A1c was last recorded at 6.0. She is taking tirzepatide  (Mounjaro ) and feels it helps curb her appetite.  She takes valacyclovir  daily, which causes itching near the injection site lasting about three to four days. A friend advised her to use Vaseline to alleviate the itching.  She uses albuterol  occasionally, particularly when experiencing coughing while eating. She takes Qvar  twice a day to help reduce the need for albuterol .  She received her tetanus shot recently, which made her arm sore.     ROS   See HPI Objective:     BP (!) 108/54 (BP Location: Right Arm, Patient Position: Sitting, Cuff Size: Normal)   Pulse 62   Temp 97.8 F (36.6 C) (Oral)   Resp 16   Ht 5' 3 (1.6 m)   Wt 208 lb (94.3 kg)   LMP 11/10/2011   SpO2 100%   BMI 36.85 kg/m    Physical Exam Constitutional:      General: She is not in acute distress.    Appearance: Normal appearance. She is well-developed.  HENT:     Head: Normocephalic and atraumatic.     Right Ear: External ear normal.     Left Ear: External ear normal.  Eyes:     General: No scleral icterus. Neck:     Thyroid : No  thyromegaly.  Cardiovascular:     Rate and Rhythm: Normal rate and regular rhythm.     Heart sounds: Normal heart sounds. No murmur heard. Pulmonary:     Effort: Pulmonary effort is normal. No respiratory distress.     Breath sounds: Normal breath sounds. No wheezing.  Musculoskeletal:     Cervical back: Neck supple.  Skin:    General: Skin is warm and dry.  Neurological:     Mental Status: She is alert and oriented to person, place, and time.  Psychiatric:        Mood and Affect: Mood normal.        Behavior: Behavior normal.        Thought Content: Thought content normal.        Judgment: Judgment normal.      Results for orders placed or performed in visit on 03/27/24  HgB A1c  Result Value Ref Range   Hgb A1c MFr Bld 5.5 4.6 - 6.5 %  Comp Met (CMET)  Result Value Ref Range   Sodium 143 135 - 145 mEq/L   Potassium 3.9 3.5 - 5.1 mEq/L   Chloride 106 96 - 112 mEq/L   CO2 31 19 - 32 mEq/L   Glucose, Bld 77 70 - 99 mg/dL  BUN 14 6 - 23 mg/dL   Creatinine, Ser 9.24 0.40 - 1.20 mg/dL   Total Bilirubin 0.4 0.2 - 1.2 mg/dL   Alkaline Phosphatase 68 39 - 117 U/L   AST 28 0 - 37 U/L   ALT 28 0 - 35 U/L   Total Protein 6.8 6.0 - 8.3 g/dL   Albumin 3.9 3.5 - 5.2 g/dL   GFR 17.07 >39.99 mL/min   Calcium 9.4 8.4 - 10.5 mg/dL      The 89-bzjm ASCVD risk score (Arnett DK, et al., 2019) is: 11.9%    Assessment & Plan:   Problem List Items Addressed This Visit       Unprioritized   Uncomplicated asthma   She continues qvar  with only occasional use of albuterol .  Continue same.       Type 2 diabetes mellitus without complication, without long-term current use of insulin  [facility])   Lab Results  Component Value Date   HGBA1C 6.0 12/23/2023   HGBA1C 5.7 08/23/2023   HGBA1C 5.9 05/24/2023   Lab Results  Component Value Date   MICROALBUR 1.6 12/23/2023   LDLCALC 84 08/23/2023   CREATININE 0.78 12/23/2023   Wt Readings from Last 3 Encounters:  03/27/24 208 lb (94.3  kg)  12/23/23 207 lb (93.9 kg)  12/06/23 203 lb (92.1 kg)   Stable on mounjaro , update A1C.       Relevant Orders   HgB A1c (Completed)   Comp Met (CMET) (Completed)   Status post total right knee replacement   Feels much better now and she has started back exercising.       OSA (obstructive sleep apnea)   Does not use cpap- prefers to work on weight loss.       Mixed hyperlipidemia   Lab Results  Component Value Date   CHOL 154 08/23/2023   HDL 57.30 08/23/2023   LDLCALC 84 08/23/2023   TRIG 63.0 08/23/2023   CHOLHDL 3 08/23/2023   Stable on pravastatin .       Genital herpes   No outbreaks, continue daily valtrex .       Essential hypertension   BP stable. Continue amlodipine  and losartan .       Class 2 severe obesity with serious comorbidity and body mass index (BMI) of 35.0 to 35.9 in adult   Counseled on weight loss.       Other Visit Diagnoses       Needs flu shot    -  Primary   Relevant Orders   Flu vaccine HIGH DOSE PF(Fluzone Trivalent) (Completed)     Chronic obstructive pulmonary disease, unspecified COPD type (HCC)           Assessment and Plan

## 2024-03-27 NOTE — Patient Instructions (Signed)
  VISIT SUMMARY: Today, you had a routine follow-up visit. Your blood pressure, diabetes, and cholesterol are well controlled with your current medications. You are making good progress with your weight loss and exercise routine, especially after your knee surgery. We discussed your asthma, sleep apnea, and genital herpes management, and you received your flu shot today.  YOUR PLAN: -HYPERTENSION: Your blood pressure is well controlled at 108/54 mmHg with your current medications. Continue taking losartan  100 mg and amlodipine  5 mg daily.  -TYPE 2 DIABETES MELLITUS: Your A1c level is well controlled at 6.0%, indicating good blood sugar management. Continue taking tirzepatide  (Mounjaro ) 7.5 mg once a week. We have ordered an A1c test to monitor your levels.  -OBSTRUCTIVE SLEEP APNEA: Obstructive sleep apnea is a condition where your breathing stops and starts during sleep. Continue your weight loss efforts to help manage this condition.  -ASTHMA: Asthma is a condition that affects your airways and can cause difficulty breathing. Continue using Qvar  twice daily and albuterol  as needed. Monitor how often you use albuterol  and let us  know if you need it daily.  -GENITAL HERPES: Genital herpes is a viral infection that causes sores in the genital area. Continue taking valacyclovir  (Valtrex ) 1000 mg daily. You may consider taking Claritin on the day of your injection and for a few days after to help with itching.  -SEVERE OBESITY: Severe obesity is a condition where your body mass index (BMI) is 40 or higher. Continue your regular exercise regimen to aid in weight loss.  -MIXED HYPERLIPIDEMIA: Mixed hyperlipidemia is a condition with high levels of different types of fats in your blood. Your cholesterol levels are good with pravastatin . Continue taking pravastatin  80 mg daily.  -UNILATERAL PRIMARY OSTEOARTHRITIS, RIGHT KNEE (S/P KNEE REPLACEMENT): Osteoarthritis is a condition that causes joint pain and  stiffness. Your mobility and ability to exercise have improved since your knee surgery. Continue your current exercise regimen.  -GENERAL HEALTH MAINTENANCE: You received your tetanus and COVID vaccinations recently and got your flu shot today. Your kidney function was checked and is normal. We will see you again in 3 months for a follow-up visit.  INSTRUCTIONS: We have scheduled a follow-up appointment in 3 months. Please continue with your current medications and exercise regimen. Monitor your albuterol  use and report if you need it daily. We have ordered an A1c test to monitor your diabetes. Consider taking Claritin on the day of your valacyclovir  injection and for a few days after to help with itching.

## 2024-03-27 NOTE — Assessment & Plan Note (Signed)
 Lab Results  Component Value Date   CHOL 154 08/23/2023   HDL 57.30 08/23/2023   LDLCALC 84 08/23/2023   TRIG 63.0 08/23/2023   CHOLHDL 3 08/23/2023   Stable on pravastatin .

## 2024-03-27 NOTE — Assessment & Plan Note (Signed)
 Does not use cpap- prefers to work on weight loss.

## 2024-03-27 NOTE — Assessment & Plan Note (Signed)
 BP stable. Continue amlodipine  and losartan .

## 2024-03-27 NOTE — Assessment & Plan Note (Signed)
 No outbreaks, continue daily valtrex .

## 2024-03-27 NOTE — Assessment & Plan Note (Signed)
 She continues qvar  with only occasional use of albuterol .  Continue same.

## 2024-04-09 ENCOUNTER — Encounter (HOSPITAL_COMMUNITY): Payer: Self-pay

## 2024-04-09 NOTE — Pre-Procedure Instructions (Signed)
 Surgical Instructions   Your procedure is scheduled on April 18, 2024. Report to [facility] Main Entrance A at 9:45 A.M., then check in with the Admitting office. Any questions or running late day of surgery: call [phone]  Questions prior to your surgery date: call [phone], Monday-Friday, 8am-4pm. If you experience any cold or flu symptoms such as cough, fever, chills, shortness of breath, etc. between now and your scheduled surgery, please notify us  at the above number.     Remember:  Do not eat after midnight the night before your surgery   You may drink clear liquids until 8:45 AM the morning of your surgery.   Clear liquids allowed are: Water , Non-Citrus Juices (without pulp), Carbonated Beverages, Clear Tea (no milk, honey, etc.), Black Coffee Only (NO MILK, CREAM OR POWDERED CREAMER of any kind), and Gatorade.    Take these medicines the morning of surgery with A SIP OF WATER : amLODipine  (NORVASC )  QVAR  REDIHALER inhaler  valACYclovir  (VALTREX )    May take these medicines IF NEEDED: albuterol  (VENTOLIN  HFA) inhaler - please bring inhaler with you morning of surgery oxyCODONE  (OXY IR/ROXICODONE )    One week prior to surgery, STOP taking any Aspirin  (unless otherwise instructed by your surgeon) Aleve , Naproxen , Ibuprofen , Motrin , Advil , Goody's, BC's, all herbal medications, fish oil, and non-prescription vitamins. This includes your medication: diclofenac  Sodium (VOLTAREN ) GEL    WHAT DO I DO ABOUT MY DIABETES MEDICATION?   STOP taking your MOUNJARO  one week prior to surgery. DO NOT take any doses after November 25th.   HOW TO MANAGE YOUR DIABETES BEFORE AND AFTER SURGERY  Why is it important to control my blood sugar before and after surgery? Improving blood sugar levels before and after surgery helps healing and can limit problems. A way of improving blood sugar control is eating a healthy diet by:  Eating less sugar and carbohydrates  Increasing  activity/exercise  Talking with your doctor about reaching your blood sugar goals High blood sugars (greater than 180 mg/dL) can raise your risk of infections and slow your recovery, so you will need to focus on controlling your diabetes during the weeks before surgery. Make sure that the doctor who takes care of your diabetes knows about your planned surgery including the date and location.  How do I manage my blood sugar before surgery? Check your blood sugar at least 4 times a day, starting 2 days before surgery, to make sure that the level is not too high or low.  Check your blood sugar the morning of your surgery when you wake up and every 2 hours until you get to the Short Stay unit.  If your blood sugar is less than 70 mg/dL, you will need to treat for low blood sugar: Do not take insulin . Treat a low blood sugar (less than 70 mg/dL) with  cup of clear juice (cranberry or apple), 4 glucose tablets, OR glucose gel. Recheck blood sugar in 15 minutes after treatment (to make sure it is greater than 70 mg/dL). If your blood sugar is not greater than 70 mg/dL on recheck, call [phone] for further instructions. Report your blood sugar to the short stay nurse when you get to Short Stay.  If you are admitted to the hospital after surgery: Your blood sugar will be checked by the staff and you will probably be given insulin  after surgery (instead of oral diabetes medicines) to make sure you have good blood sugar levels. The goal for blood sugar control after surgery is  80-180 mg/dL.                      Do NOT Smoke (Tobacco/Vaping) for 24 hours prior to your procedure.  If you use a CPAP at night, you may bring your mask/headgear for your overnight stay.   You will be asked to remove any contacts, glasses, piercing's, hearing aid's, dentures/partials prior to surgery. Please bring cases for these items if needed.    Patients discharged the day of surgery will not be allowed to drive  home, and someone needs to stay with them for 24 hours.  SURGICAL WAITING ROOM VISITATION Patients may have no more than 2 support people in the waiting area - these visitors may rotate.   Pre-op nurse will coordinate an appropriate time for 1 ADULT support person, who may not rotate, to accompany patient in pre-op.  Children under the age of 31 must have an adult with them who is not the patient and must remain in the main waiting area with an adult.  If the patient needs to stay at the hospital during part of their recovery, the visitor guidelines for inpatient rooms apply.  Please refer to the [employer] website for the visitor guidelines for any additional information.   If you received a COVID test during your pre-op visit  it is requested that you wear a mask when out in public, stay away from anyone that may not be feeling well and notify your surgeon if you develop symptoms. If you have been in contact with anyone that has tested positive in the last 10 days please notify you surgeon.      Pre-operative CHG Bathing Instructions   You can play a key role in reducing the risk of infection after surgery. Your skin needs to be as free of germs as possible. You can reduce the number of germs on your skin by washing with CHG (chlorhexidine  gluconate) soap before surgery. CHG is an antiseptic soap that kills germs and continues to kill germs even after washing.   DO NOT use if you have an allergy to chlorhexidine /CHG or antibacterial soaps. If your skin becomes reddened or irritated, stop using the CHG and notify one of our RNs at [phone].              TAKE A SHOWER THE NIGHT BEFORE SURGERY   Please keep in mind the following:  DO NOT shave, including legs and underarms, 48 hours prior to surgery.   You may shave your face before/day of surgery.  Place clean sheets on your bed the night before surgery Use a clean washcloth (not used since being washed) for shower. DO NOT sleep  with pet's night before surgery.  CHG Shower Instructions:  Wash your face and private area with normal soap. If you choose to wash your hair, wash first with your normal shampoo.  After you use shampoo/soap, rinse your hair and body thoroughly to remove shampoo/soap residue.  Turn the water  OFF and apply half the bottle of CHG soap to a CLEAN washcloth.  Apply CHG soap ONLY FROM YOUR NECK DOWN TO YOUR TOES (washing for 3-5 minutes)  DO NOT use CHG soap on face, private areas, open wounds, or sores.  Pay special attention to the area where your surgery is being performed.  If you are having back surgery, having someone wash your back for you may be helpful. Wait 2 minutes after CHG soap is applied, then you may rinse off  the CHG soap.  Pat dry with a clean towel  Put on clean pajamas    Additional instructions for the day of surgery: If you choose, you may shower the morning of surgery with an antibacterial soap.  DO NOT APPLY any lotions, deodorants, cologne, or perfumes.   Do not wear jewelry or makeup Do not wear nail polish, gel polish, artificial nails, or any other type of covering on natural nails (fingers and toes) Do not bring valuables to the hospital. [facility] is not responsible for valuables/personal belongings. Put on clean/comfortable clothes.  Please brush your teeth.  Ask your nurse before applying any prescription medications to the skin.

## 2024-04-10 ENCOUNTER — Encounter (HOSPITAL_COMMUNITY)
Admission: RE | Admit: 2024-04-10 | Discharge: 2024-04-10 | Disposition: A | Discharge status: Home / Self Care | Source: Ambulatory Visit | Attending: General Surgery | Admitting: General Surgery

## 2024-04-10 ENCOUNTER — Encounter (HOSPITAL_COMMUNITY): Payer: Self-pay

## 2024-04-10 ENCOUNTER — Other Ambulatory Visit: Payer: Self-pay

## 2024-04-10 VITALS — BP 102/50 | HR 73 | Temp 97.9°F | Resp 16 | Ht 63.0 in | Wt 204.0 lb

## 2024-04-10 DIAGNOSIS — E119 Type 2 diabetes mellitus without complications: Secondary | ICD-10-CM | POA: Insufficient documentation

## 2024-04-10 DIAGNOSIS — Z01818 Encounter for other preprocedural examination: Secondary | ICD-10-CM | POA: Diagnosis present

## 2024-04-10 HISTORY — DX: Type 2 diabetes mellitus without complications: E11.9

## 2024-04-10 LAB — CBC
HCT: 41 % (ref 36.0–46.0)
Hemoglobin: 13 g/dL (ref 12.0–15.0)
MCH: 29.9 pg (ref 26.0–34.0)
MCHC: 31.7 g/dL (ref 30.0–36.0)
MCV: 94.3 fL (ref 80.0–100.0)
Platelets: 227 K/uL (ref 150–400)
RBC: 4.35 MIL/uL (ref 3.87–5.11)
RDW: 15.2 % (ref 11.5–15.5)
WBC: 5.4 K/uL (ref 4.0–10.5)
nRBC: 0 % (ref 0.0–0.2)

## 2024-04-10 LAB — GLUCOSE, CAPILLARY: Glucose-Capillary: 91 mg/dL (ref 70–99)

## 2024-04-10 NOTE — Progress Notes (Signed)
 PCP - Daryl Setter, NP Cardiologist - Victor Bruckner, MD  PPM/ICD - denies Device Orders - n/a Rep Notified - n/a  Chest x-ray - n/a EKG - 04/10/2024 Stress Test - 08/28/2020 ECHO - 03/18/2020 Cardiac Cath - denies  Sleep Study - OSA CPAP - denies  Fasting Blood Sugar - 100; 91 at PAT Checks Blood Sugar 0 times a day  Last dose of GLP1 agonist-  04/04/2024 GLP1 instructions: hold for 1 week  Blood Thinner Instructions: n/a Aspirin  Instructions: n/a  ERAS Protcol -yes PRE-SURGERY Ensure or G2- no  COVID TEST- n/a    Anesthesia review: yes, hx of DM2, OSA, HTN.   Patient denies shortness of breath, fever, cough and chest pain at PAT appointment   All instructions explained to the patient, with a verbal understanding of the material. Patient agrees to go over the instructions while at home for a better understanding. Patient also instructed to self quarantine after being tested for COVID-19. The opportunity to ask questions was provided.

## 2024-04-13 ENCOUNTER — Other Ambulatory Visit: Payer: Self-pay | Admitting: Family

## 2024-04-13 DIAGNOSIS — E119 Type 2 diabetes mellitus without complications: Secondary | ICD-10-CM

## 2024-04-16 ENCOUNTER — Ambulatory Visit: Admitting: Podiatry

## 2024-04-16 ENCOUNTER — Ambulatory Visit

## 2024-04-16 DIAGNOSIS — M25374 Other instability, right foot: Secondary | ICD-10-CM

## 2024-04-16 DIAGNOSIS — M778 Other enthesopathies, not elsewhere classified: Secondary | ICD-10-CM

## 2024-04-16 DIAGNOSIS — M7751 Other enthesopathy of right foot: Secondary | ICD-10-CM

## 2024-04-16 DIAGNOSIS — M779 Enthesopathy, unspecified: Secondary | ICD-10-CM

## 2024-04-16 NOTE — Progress Notes (Unsigned)
 Subjective: Chief Complaint  Patient presents with   Foot Pain    Right foot pt stated that at times she feels like her foot is going to give out on her when walking      68 year old female presents the office today with above concerns.  She states that the symptoms on the right side continued to worsen and she feels at times her foot is going to give out.  No recent injuries or falls affecting her lower extremities.   She is scheduled to have hernia surgery on Wednesday.      Objective: AAO x3, NAD DP/PT pulses palpable bilaterally, CRT less than 3 seconds Today tenderness is mostly localized on the dorsal aspect the foot on the Lisfranc joint area.  There is no significant pain to the ankle itself.  There is no erythema or warmth.  There is no area of pinpoint tenderness.   No pain with calf compression, swelling, warmth, erythema  Assessment: Capsulitis, tendinitis  Plan: -All treatment options discussed with the patient including all alternatives, risks, complications.  -Given she is having surgery on Wednesday recommend hold off any steroids or anti-inflammatories at this time. -We discussed shoes with better arch support and long-term she may benefit more from an orthotic.  For now we will dispense a Tri-Lock ankle brace to help support and stabilize the foot, ankle particularly for when she is walking the treadmill. -She can use topical medication such as Biofreeze until after her surgery.  Radiology: X-rays were obtained reviewed of the right foot.  Multiple views obtained.  Decreased calcaneal inclination angle.  Calcaneal spurring is present.  Midfoot arthritic changes noted.  No evidence of acute fracture.  Return in about 4 weeks (around 05/14/2024) for right foot pain follow-up.  Donnice JONELLE Fees DPM

## 2024-04-17 ENCOUNTER — Other Ambulatory Visit: Payer: Self-pay | Admitting: Podiatry

## 2024-04-17 DIAGNOSIS — M25374 Other instability, right foot: Secondary | ICD-10-CM

## 2024-04-17 DIAGNOSIS — M778 Other enthesopathies, not elsewhere classified: Secondary | ICD-10-CM

## 2024-04-18 ENCOUNTER — Encounter (HOSPITAL_COMMUNITY): Admission: RE | Disposition: A | Payer: Self-pay | Source: Home / Self Care | Attending: General Surgery

## 2024-04-18 ENCOUNTER — Observation Stay (HOSPITAL_COMMUNITY)
Admission: RE | Admit: 2024-04-18 | Discharge: 2024-04-20 | Disposition: A | Attending: General Surgery | Admitting: General Surgery

## 2024-04-18 ENCOUNTER — Other Ambulatory Visit: Payer: Self-pay

## 2024-04-18 ENCOUNTER — Encounter (HOSPITAL_COMMUNITY): Payer: Self-pay | Admitting: General Surgery

## 2024-04-18 ENCOUNTER — Ambulatory Visit (HOSPITAL_COMMUNITY): Admitting: Anesthesiology

## 2024-04-18 ENCOUNTER — Ambulatory Visit (HOSPITAL_COMMUNITY): Payer: Self-pay | Admitting: Physician Assistant

## 2024-04-18 DIAGNOSIS — G4733 Obstructive sleep apnea (adult) (pediatric): Secondary | ICD-10-CM | POA: Diagnosis not present

## 2024-04-18 DIAGNOSIS — I10 Essential (primary) hypertension: Secondary | ICD-10-CM | POA: Diagnosis not present

## 2024-04-18 DIAGNOSIS — K439 Ventral hernia without obstruction or gangrene: Secondary | ICD-10-CM

## 2024-04-18 DIAGNOSIS — E119 Type 2 diabetes mellitus without complications: Secondary | ICD-10-CM | POA: Diagnosis not present

## 2024-04-18 DIAGNOSIS — K432 Incisional hernia without obstruction or gangrene: Principal | ICD-10-CM | POA: Diagnosis present

## 2024-04-18 HISTORY — PX: XI ROBOTIC ASSISTED VENTRAL HERNIA: SHX6789

## 2024-04-18 HISTORY — PX: VENTRAL HERNIA REPAIR: SHX424

## 2024-04-18 LAB — GLUCOSE, CAPILLARY
Glucose-Capillary: 74 mg/dL (ref 70–99)
Glucose-Capillary: 125 mg/dL — ABNORMAL HIGH (ref 70–99)
Glucose-Capillary: 134 mg/dL — ABNORMAL HIGH (ref 70–99)

## 2024-04-18 LAB — CREATININE, SERUM
Creatinine, Ser: 0.79 mg/dL (ref 0.44–1.00)
GFR, Estimated: >60 mL/min (ref >60)

## 2024-04-18 LAB — CBC
HCT: 42.2 % (ref 36.0–46.0)
Hemoglobin: 13.5 g/dL (ref 12.0–15.0)
MCH: 30.2 pg (ref 26.0–34.0)
MCHC: 32 g/dL (ref 30.0–36.0)
MCV: 94.4 fL (ref 80.0–100.0)
Platelets: 186 K/uL (ref 150–400)
RBC: 4.47 MIL/uL (ref 3.87–5.11)
RDW: 15.3 % (ref 11.5–15.5)
WBC: 11 K/uL — ABNORMAL HIGH (ref 4.0–10.5)
nRBC: 0 % (ref 0.0–0.2)

## 2024-04-18 SURGERY — REPAIR, HERNIA, VENTRAL, ROBOT-ASSISTED
Anesthesia: General

## 2024-04-18 MED ORDER — METHOCARBAMOL 1000 MG/10ML IJ SOLN: 500.0000 mg | Freq: Three times a day (TID) | INTRAMUSCULAR | Status: DC | PRN

## 2024-04-18 MED ORDER — LIDOCAINE 2% (20 MG/ML) 5 ML SYRINGE
INTRAMUSCULAR | Status: DC | PRN
  Administered 2024-04-18: 60 mg via INTRAVENOUS

## 2024-04-18 MED ORDER — HYDROMORPHONE HCL 1 MG/ML IJ SOLN
INTRAMUSCULAR | Status: AC
  Filled 2024-04-18: qty 0.5

## 2024-04-18 MED ORDER — SUGAMMADEX SODIUM 200 MG/2ML IV SOLN
INTRAVENOUS | Status: DC | PRN
  Administered 2024-04-18: 200 mg via INTRAVENOUS

## 2024-04-18 MED ORDER — BUPIVACAINE-EPINEPHRINE (PF) 0.25% -1:200000 IJ SOLN
INTRAMUSCULAR | Status: DC | PRN
  Administered 2024-04-18: 25 mL

## 2024-04-18 MED ORDER — HYDROMORPHONE HCL 1 MG/ML IJ SOLN
INTRAMUSCULAR | Status: DC | PRN
  Administered 2024-04-18 (×2): .25 mg via INTRAVENOUS

## 2024-04-18 MED ORDER — CHLORHEXIDINE GLUCONATE 0.12 % MT SOLN
15.0000 mL | Freq: Once | OROMUCOSAL | Status: AC
  Administered 2024-04-18: 15 mL via OROMUCOSAL
  Filled 2024-04-18: qty 15

## 2024-04-18 MED ORDER — FENTANYL CITRATE (PF) 250 MCG/5ML IJ SOLN
INTRAMUSCULAR | Status: AC
  Filled 2024-04-18: qty 5

## 2024-04-18 MED ORDER — ONDANSETRON HCL 4 MG/2ML IJ SOLN
INTRAMUSCULAR | Status: AC
  Filled 2024-04-18: qty 2

## 2024-04-18 MED ORDER — ENOXAPARIN SODIUM 40 MG/0.4ML IJ SOSY
40.0000 mg | PREFILLED_SYRINGE | INTRAMUSCULAR | Status: DC
  Administered 2024-04-19 – 2024-04-20 (×2): 40 mg via SUBCUTANEOUS
  Filled 2024-04-18 (×2): qty 0.4

## 2024-04-18 MED ORDER — KETOROLAC TROMETHAMINE 15 MG/ML IJ SOLN: 15.0000 mg | Freq: Four times a day (QID) | INTRAMUSCULAR | Status: DC | PRN

## 2024-04-18 MED ORDER — FENTANYL CITRATE (PF) 250 MCG/5ML IJ SOLN
INTRAMUSCULAR | Status: DC | PRN
  Administered 2024-04-18: 100 ug via INTRAVENOUS

## 2024-04-18 MED ORDER — PRAVASTATIN SODIUM 10 MG PO TABS
10.0000 mg | ORAL_TABLET | Freq: Every day | ORAL | Status: DC
  Administered 2024-04-18 – 2024-04-20 (×3): 10 mg via ORAL
  Filled 2024-04-18 (×3): qty 1

## 2024-04-18 MED ORDER — ROCURONIUM BROMIDE 10 MG/ML (PF) SYRINGE
PREFILLED_SYRINGE | INTRAVENOUS | Status: AC
  Filled 2024-04-18: qty 10

## 2024-04-18 MED ORDER — ORAL CARE MOUTH RINSE: 15.0000 mL | Freq: Once | OROMUCOSAL | Status: AC

## 2024-04-18 MED ORDER — HYDROMORPHONE HCL 1 MG/ML IJ SOLN
1.0000 mg | INTRAMUSCULAR | Status: DC | PRN
  Administered 2024-04-18: 1 mg via INTRAVENOUS
  Filled 2024-04-18: qty 1

## 2024-04-18 MED ORDER — ACETAMINOPHEN 500 MG PO TABS
1000.0000 mg | ORAL_TABLET | Freq: Four times a day (QID) | ORAL | Status: DC
  Administered 2024-04-18 – 2024-04-20 (×6): 1000 mg via ORAL
  Filled 2024-04-18 (×7): qty 2

## 2024-04-18 MED ORDER — ROCURONIUM BROMIDE 10 MG/ML (PF) SYRINGE
PREFILLED_SYRINGE | INTRAVENOUS | Status: DC | PRN
  Administered 2024-04-18: 10 mg via INTRAVENOUS
  Administered 2024-04-18: 50 mg via INTRAVENOUS
  Administered 2024-04-18: 10 mg via INTRAVENOUS
  Administered 2024-04-18: 20 mg via INTRAVENOUS
  Administered 2024-04-18: 10 mg via INTRAVENOUS

## 2024-04-18 MED ORDER — PHENYLEPHRINE 80 MCG/ML (10ML) SYRINGE FOR IV PUSH (FOR BLOOD PRESSURE SUPPORT)
PREFILLED_SYRINGE | INTRAVENOUS | Status: AC
  Filled 2024-04-18: qty 10

## 2024-04-18 MED ORDER — ALBUTEROL SULFATE (2.5 MG/3ML) 0.083% IN NEBU: 3.0000 mL | INHALATION_SOLUTION | Freq: Four times a day (QID) | RESPIRATORY_TRACT | Status: DC | PRN

## 2024-04-18 MED ORDER — VISTASEAL 10 ML SINGLE DOSE KIT
10.0000 mL | PACK | Freq: Once | CUTANEOUS | Status: DC
  Filled 2024-04-18: qty 10

## 2024-04-18 MED ORDER — ALBUMIN HUMAN 5 % IV SOLN: INTRAVENOUS | Status: DC | PRN

## 2024-04-18 MED ORDER — HYDROMORPHONE HCL 1 MG/ML IJ SOLN
0.2500 mg | INTRAMUSCULAR | Status: DC | PRN
  Administered 2024-04-18: 0.5 mg via INTRAVENOUS

## 2024-04-18 MED ORDER — LIDOCAINE 2% (20 MG/ML) 5 ML SYRINGE
INTRAMUSCULAR | Status: AC
  Filled 2024-04-18: qty 5

## 2024-04-18 MED ORDER — PROPOFOL 10 MG/ML IV BOLUS
INTRAVENOUS | Status: AC
  Filled 2024-04-18: qty 20

## 2024-04-18 MED ORDER — LACTATED RINGERS IV SOLN: INTRAVENOUS | Status: DC

## 2024-04-18 MED ORDER — CEFAZOLIN SODIUM-DEXTROSE 2-4 GM/100ML-% IV SOLN
2.0000 g | INTRAVENOUS | Status: AC
  Administered 2024-04-18: 2 g via INTRAVENOUS
  Filled 2024-04-18: qty 100

## 2024-04-18 MED ORDER — LOSARTAN POTASSIUM 50 MG PO TABS
100.0000 mg | ORAL_TABLET | Freq: Every day | ORAL | Status: DC
  Administered 2024-04-18 – 2024-04-20 (×3): 100 mg via ORAL
  Filled 2024-04-18 (×3): qty 2

## 2024-04-18 MED ORDER — GABAPENTIN 300 MG PO CAPS
300.0000 mg | ORAL_CAPSULE | ORAL | Status: AC
  Administered 2024-04-18: 300 mg via ORAL
  Filled 2024-04-18: qty 1

## 2024-04-18 MED ORDER — MIDAZOLAM HCL 2 MG/2ML IJ SOLN
INTRAMUSCULAR | Status: AC
  Filled 2024-04-18: qty 2

## 2024-04-18 MED ORDER — KETOROLAC TROMETHAMINE 15 MG/ML IJ SOLN
15.0000 mg | Freq: Four times a day (QID) | INTRAMUSCULAR | Status: AC
  Administered 2024-04-18: 15 mg via INTRAVENOUS
  Filled 2024-04-18: qty 1

## 2024-04-18 MED ORDER — OXYCODONE HCL 5 MG PO TABS
5.0000 mg | ORAL_TABLET | ORAL | Status: DC | PRN
  Administered 2024-04-19 (×2): 5 mg via ORAL
  Filled 2024-04-18 (×2): qty 1

## 2024-04-18 MED ORDER — CHLORHEXIDINE GLUCONATE CLOTH 2 % EX PADS: 6.0000 | MEDICATED_PAD | Freq: Once | CUTANEOUS | Status: DC

## 2024-04-18 MED ORDER — ONDANSETRON 4 MG PO TBDP: 4.0000 mg | ORAL_TABLET | Freq: Four times a day (QID) | ORAL | Status: DC | PRN

## 2024-04-18 MED ORDER — ONDANSETRON HCL 4 MG/2ML IJ SOLN
INTRAMUSCULAR | Status: DC | PRN
  Administered 2024-04-18: 4 mg via INTRAVENOUS

## 2024-04-18 MED ORDER — ONDANSETRON HCL 4 MG/2ML IJ SOLN: 4.0000 mg | Freq: Four times a day (QID) | INTRAMUSCULAR | Status: DC | PRN

## 2024-04-18 MED ORDER — PROPOFOL 10 MG/ML IV BOLUS
INTRAVENOUS | Status: DC | PRN
  Administered 2024-04-18: 150 mg via INTRAVENOUS

## 2024-04-18 MED ORDER — DEXAMETHASONE SOD PHOSPHATE PF 10 MG/ML IJ SOLN
INTRAMUSCULAR | Status: DC | PRN
  Administered 2024-04-18: 10 mg via INTRAVENOUS

## 2024-04-18 MED ORDER — MIDAZOLAM HCL (PF) 2 MG/2ML IJ SOLN
INTRAMUSCULAR | Status: DC | PRN
  Administered 2024-04-18: 2 mg via INTRAVENOUS

## 2024-04-18 MED ORDER — ACETAMINOPHEN 500 MG PO TABS
1000.0000 mg | ORAL_TABLET | ORAL | Status: AC
  Administered 2024-04-18: 1000 mg via ORAL
  Filled 2024-04-18: qty 2

## 2024-04-18 MED ORDER — METHOCARBAMOL 500 MG PO TABS
500.0000 mg | ORAL_TABLET | Freq: Three times a day (TID) | ORAL | Status: DC | PRN
  Administered 2024-04-19: 500 mg via ORAL
  Filled 2024-04-18: qty 1

## 2024-04-18 MED ORDER — INSULIN ASPART 100 UNIT/ML IJ SOLN: 0.0000 [IU] | INTRAMUSCULAR | Status: DC | PRN

## 2024-04-18 MED ORDER — PHENYLEPHRINE 80 MCG/ML (10ML) SYRINGE FOR IV PUSH (FOR BLOOD PRESSURE SUPPORT)
PREFILLED_SYRINGE | INTRAVENOUS | Status: DC | PRN
  Administered 2024-04-18 (×4): 80 ug via INTRAVENOUS
  Administered 2024-04-18: 160 ug via INTRAVENOUS
  Administered 2024-04-18: 80 ug via INTRAVENOUS
  Administered 2024-04-18 (×2): 160 ug via INTRAVENOUS

## 2024-04-18 MED ORDER — HYDROMORPHONE HCL 1 MG/ML IJ SOLN
INTRAMUSCULAR | Status: AC
  Filled 2024-04-18: qty 1

## 2024-04-18 SURGICAL SUPPLY — 55 items
BAG COUNTER SPONGE SURGICOUNT (BAG) IMPLANT
BINDER ABDOMINAL 10 UNV 27-48 (MISCELLANEOUS) IMPLANT
BINDER ABDOMINAL 12 ML 46-62 (SOFTGOODS) IMPLANT
CHLORAPREP W/TINT 26 (MISCELLANEOUS) ×3 IMPLANT
COVER MAYO STAND STRL (DRAPES) ×3 IMPLANT
COVER SURGICAL LIGHT HANDLE (MISCELLANEOUS) ×3 IMPLANT
COVER TIP SHEARS 8 DVNC (MISCELLANEOUS) ×3 IMPLANT
DEFOGGER SCOPE WARM SEASHARP (MISCELLANEOUS) ×3 IMPLANT
DERMABOND ADVANCED .7 DNX12 (GAUZE/BANDAGES/DRESSINGS) ×3 IMPLANT
DEVICE SECURE STRAP 25 ABSORB (INSTRUMENTS) IMPLANT
DEVICE TROCAR PUNCTURE CLOSURE (ENDOMECHANICALS) IMPLANT
DRAPE ARM DVNC X/XI (DISPOSABLE) ×12 IMPLANT
DRAPE COLUMN DVNC XI (DISPOSABLE) ×3 IMPLANT
DRAPE CV SPLIT W-CLR ANES SCRN (DRAPES) ×3 IMPLANT
DRAPE SURG ORHT 6 SPLT 77X108 (DRAPES) ×3 IMPLANT
DRIVER NDL MEGA 8 DVNC XI (INSTRUMENTS) IMPLANT
DRIVER NDL MEGA SUTCUT DVNCXI (INSTRUMENTS) IMPLANT
FORCEPS BPLR FENES DVNC XI (FORCEP) ×3 IMPLANT
GLOVE BIO SURGEON STRL SZ7 (GLOVE) ×3 IMPLANT
GOWN STRL REUS W/ TWL LRG LVL3 (GOWN DISPOSABLE) ×6 IMPLANT
GOWN STRL REUS W/ TWL XL LVL3 (GOWN DISPOSABLE) ×6 IMPLANT
GOWN STRL REUS W/TWL 2XL LVL3 (GOWN DISPOSABLE) ×3 IMPLANT
HANDLE SUCTION POOLE (INSTRUMENTS) IMPLANT
IRRIGATION SUCT STRKRFLW 2 WTP (MISCELLANEOUS) IMPLANT
KIT BASIN OR (CUSTOM PROCEDURE TRAY) ×3 IMPLANT
KIT TURNOVER KIT B (KITS) ×3 IMPLANT
LHOOK LAP DISP 36CM (ELECTROSURGICAL) IMPLANT
MARKER SKIN DUAL TIP RULER LAB (MISCELLANEOUS) ×3 IMPLANT
MESH SOFT 12X12IN BARD (Mesh General) IMPLANT
NDL HYPO 22X1.5 SAFETY MO (MISCELLANEOUS) ×3 IMPLANT
NDL HYPO 25GX1X1/2 BEV (NEEDLE) IMPLANT
NDL INSUFFLATION 14GA 120MM (NEEDLE) ×3 IMPLANT
OBTURATOR OPTICALSTD 8 DVNC (TROCAR) ×3 IMPLANT
PAD ARMBOARD POSITIONER FOAM (MISCELLANEOUS) ×6 IMPLANT
PENCIL BUTTON HOLSTER BLD 10FT (ELECTRODE) IMPLANT
PENCIL SMOKE EVACUATOR (MISCELLANEOUS) IMPLANT
SCISSORS LAP 5X35 DISP (ENDOMECHANICALS) IMPLANT
SCISSORS MNPLR CVD DVNC XI (INSTRUMENTS) ×3 IMPLANT
SEAL UNIV 5-12 XI (MISCELLANEOUS) ×9 IMPLANT
SET TUBE SMOKE EVAC HIGH FLOW (TUBING) ×3 IMPLANT
SPIKE FLUID TRANSFER (MISCELLANEOUS) IMPLANT
STOPCOCK 4 WAY LG BORE MALE ST (IV SETS) ×3 IMPLANT
SUT MNCRL AB 4-0 PS2 18 (SUTURE) ×3 IMPLANT
SUT PDS AB 2-0 CT1 27 (SUTURE) IMPLANT
SUT STRATA PDS 2-0 23 CT-1 (SUTURE) IMPLANT
SUT STRATAFIX 1PDS 45CM VIOLET (SUTURE) IMPLANT
SUT VIC AB 3-0 SH 18 (SUTURE) IMPLANT
SUT VIC AB 3-0 SH 27X BRD (SUTURE) ×3 IMPLANT
SUT VICRYL 0 UR6 27IN ABS (SUTURE) IMPLANT
SUTURE STRATFX 0 PDS+ CT-2 23 (SUTURE) IMPLANT
TOWEL GREEN STERILE FF (TOWEL DISPOSABLE) ×3 IMPLANT
TRAY LAPAROSCOPIC MC (CUSTOM PROCEDURE TRAY) ×3 IMPLANT
TROCAR XCEL NON-BLD 5MMX100MML (ENDOMECHANICALS) IMPLANT
TROCAR Z-THREAD FIOS 5X100MM (TROCAR) IMPLANT
TROCAR Z-THREAD OPTICAL 5X100M (TROCAR) IMPLANT

## 2024-04-18 NOTE — Anesthesia Procedure Notes (Signed)
 Procedure Name: Intubation Date/Time: 04/18/2024 11:32 AM  Performed by: Jahziel Sinn J, CRNAPre-anesthesia Checklist: Patient identified, Emergency Drugs available, Suction available and Patient being monitored Patient Re-evaluated:Patient Re-evaluated prior to induction Oxygen Delivery Method: Circle System Utilized Preoxygenation: Pre-oxygenation with 100% oxygen Induction Type: IV induction Ventilation: Mask ventilation without difficulty Laryngoscope Size: Miller and 3 Grade View: Grade II Tube type: Oral Tube size: 7.0 mm Number of attempts: 1 Airway Equipment and Method: Stylet and Oral airway Placement Confirmation: ETT inserted through vocal cords under direct vision, positive ETCO2 and breath sounds checked- equal and bilateral Secured at: 21 cm Tube secured with: Tape Dental Injury: Teeth and Oropharynx as per pre-operative assessment

## 2024-04-18 NOTE — H&P (Signed)
 Leah Hampton 1955/07/08  [id number].    HPI:  68 y/o F with an incisional hernia who presents for elective repair. She reports that she had a cough within the past few days but tested COVID positive. She denies other complaints and report no new medications.   ROS: Review of Systems  Constitutional: Negative.   HENT: Negative.    Eyes: Negative.   Respiratory:  Positive for cough.   Cardiovascular: Negative.   Gastrointestinal: Negative.   Genitourinary: Negative.   Musculoskeletal: Negative.   Skin: Negative.   Neurological: Negative.   Endo/Heme/Allergies: Negative.   Psychiatric/Behavioral: Negative.      Family History  Problem Relation Age of Onset   Heart failure Mother    Arthritis Mother    Hypertension Mother    Stroke Mother    Diabetes Mellitus II Mother    Heart attack Father 47   Hypertension Sister        x 2   CVA Sister    CAD Sister 76   Diabetes Mellitus II Sister    Cirrhosis Brother    Alcohol abuse Brother    Diabetes Other    Arthritis Other    Stroke Other    Coronary artery disease Other    Colon cancer Neg Hx    Breast cancer Neg Hx     Past Medical History:  Diagnosis Date   Bronchitis 2013   Chronic bronchitis (HCC)    Cystitis    Diabetes mellitus without complication (HCC)    Hematuria    urologice eval, Dr Alfonzo   History of chest pain 2021   permpt she was told it's a muscle pull   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 OR;  Service: Gynecology;;   COLONOSCOPY WITH PROPOFOL  N/A  03/27/2015   Procedure: COLONOSCOPY WITH PROPOFOL ;  Surgeon: Toribio SHAUNNA Cedar, MD;  Location: [facility] ENDOSCOPY;  Service: Endoscopy;  Laterality: N/A;   COLONOSCOPY WITH PROPOFOL  N/A 10/02/2020   Procedure: COLONOSCOPY WITH PROPOFOL ;  Surgeon: Cedar Toribio SHAUNNA, MD;  Location: [facility] ENDOSCOPY;  Service: Endoscopy;  Laterality: N/A;   COLPOSCOPY N/A 07/15/2020   Procedure: COLPOSCOPY;  Surgeon: Corene Coy, MD;  Location: [facility] OR;  Service: Gynecology;  Laterality: N/A;   DILATATION & CURETTAGE/HYSTEROSCOPY WITH MYOSURE N/A 01/05/2017   Procedure: DILATATION & CURETTAGE/HYSTEROSCOPY WITH MYOSURE;  Surgeon: Timmie Norris, MD;  Location: [facility] ORS;  Service: Gynecology;  Laterality: N/A;  PostMenopausal Bleeding   HYSTEROSCOPY WITH D & C N/A 05/22/2019   Procedure: DILATATION AND CURETTAGE /HYSTEROSCOPY, POLYPECTOMY WITH MYOSURE;  Surgeon: Corene Coy, MD;  Location: [facility] ORS;  Service: Gynecology;  Laterality: N/A;   HYSTEROSCOPY WITH D & C N/A 07/15/2020   Procedure: DILATATION AND CURETTAGE /HYSTEROSCOPY;  Surgeon: Corene Coy, MD;  Location: [facility] OR;  Service: Gynecology;  Laterality: N/A;   KNEE ARTHROSCOPY  04/2007   left knee   KNEE CLOSED REDUCTION Right 10/27/2023   Procedure: MANIPULATION, KNEE, CLOSED;  Surgeon: Vernetta Bruckner  Y, MD;  Location: [facility] ORS;  Service: Orthopedics;  Laterality: Right;   LAPAROSCOPIC GASTRIC SLEEVE RESECTION     TOTAL KNEE ARTHROPLASTY Left 10/2008   Dr Sherryn   TOTAL KNEE ARTHROPLASTY Right 08/26/2023   Procedure: ARTHROPLASTY, KNEE, TOTAL;  Surgeon: Vernetta Lonni GRADE, MD;  Location: [facility] ORS;  Service: Orthopedics;  Laterality: Right;   UPPER GI ENDOSCOPY N/A 10/14/2020   Procedure: UPPER GI ENDOSCOPY;  Surgeon: Tanda Locus, MD;  Location: [facility] ORS;  Service: General;  Laterality: N/A;    Social History:  reports that she has never smoked. She has never used smokeless tobacco. She reports that she does not drink alcohol and does  not use drugs.  Allergies:  Allergies  Allergen Reactions   Sulfamethoxazole -Trimethoprim [Bactrim] Itching   Voltaren  [Diclofenac ] Diarrhea   Codeine Nausea And Vomiting and Anxiety    Medications Prior to Admission  Medication Sig Dispense Refill   albuterol  (VENTOLIN  HFA) 108 (90 Base) MCG/ACT inhaler Inhale 2 puffs into the lungs every 6 (six) hours as needed for wheezing or shortness of breath. 34 g 1   amLODipine  (NORVASC ) 5 MG tablet Take 1 tablet (5 mg total) by mouth daily. 100 tablet 2   amoxicillin  (AMOXIL ) 500 MG capsule Take 2,000 mg by mouth See admin instructions. TAKE 4 CAPSULES BY MOUTH 1 HOUR BEFORE DENTAL PROCEDURE     Calcium-Vitamin D -Vitamin K (VIACTIV CALCIUM PLUS D) 650-12.5-40 MG-MCG-MCG CHEW Chew 1 tablet by mouth in the morning, at noon, and at bedtime.     diclofenac  Sodium (VOLTAREN ) 1 % GEL Apply 2 g topically daily as needed (pain).     losartan  (COZAAR ) 100 MG tablet Take 1 tablet (100 mg total) by mouth daily. 100 tablet 1   Multiple Vitamins-Minerals (BARIATRIC MULTIVITAMINS PO) Take 1 capsule by mouth in the morning.     oxyCODONE  (OXY IR/ROXICODONE ) 5 MG immediate release tablet Take 1 tablet (5 mg total) by mouth 2 (two) times daily as needed for moderate pain (pain score 4-6) (pain score 4-6). 30 tablet 0   pravastatin  (PRAVACHOL ) 80 MG tablet Take 1 tablet (80 mg total) by mouth daily. (Patient taking differently: Take 80 mg by mouth at bedtime. (1900)) 100 tablet 2   QVAR  REDIHALER 80 MCG/ACT inhaler USE 1 INHALATION BY MOUTH INTO THE LUNGS TWICE DAILY 21.2 g 11   valACYclovir  (VALTREX ) 1000 MG tablet Take 1 tablet (1,000 mg total) by mouth daily. (Patient taking differently: Take 500 mg by mouth daily.) 100 tablet 2   MOUNJARO  10 MG/0.5ML Pen INJECT 10MG  UNDER THE SKIN ONCE A WEEK 6 mL 0    Physical Exam: Blood pressure 116/66, pulse 90, temperature 98.5 F (36.9 C), temperature source Oral, resp. rate 18, height 5' 3 (1.6 m), weight 93 kg, last  menstrual period 11/10/2011, SpO2 99%. Gen: female, NAD Abd: soft, non-distended, hernia marked  Results for orders placed or performed during the hospital encounter of 04/18/24 (from the past 48 hours)  Glucose, capillary     Status: None   Collection Time: 04/18/24 10:09 AM  Result Value Ref Range   Glucose-Capillary 74 70 - 99 mg/dL    Comment: Glucose reference range applies only to samples taken after fasting for at least 8 hours.   Comment 1 Notify RN    Comment 2 Document in Chart    DG Foot Complete Right Result Date: 04/17/2024 Please see detailed radiograph report in office note.   Assessment/Plan 68 y/o F with an incisional hernia.   -  Will proceed to the OR. We discussed the alternatives and potential risks of surgery, including but not limited to: bleeding, infection, damage to bowel or surrounding structures, mesh complications, chronic pain, recurrent hernia, and need for additional procedures. All questions were addressed and consent was obtained.    Cordella DELENA Polly Marlis Cheron Surgery 04/18/2024, 10:33 AM Please see Amion for pager number during day hours 7:00am-4:30pm or 7:00am -11:30am on weekends

## 2024-04-18 NOTE — Progress Notes (Signed)
 Pt transfer from PACU in bed. Pt has five lap sites and a midline incision with skin glue. Mrs. Solinger is alert and oriented with call button and phone in reach. Bed down in the lowest position.

## 2024-04-18 NOTE — Anesthesia Preprocedure Evaluation (Addendum)
 Anesthesia Evaluation  Patient identified by MRN, date of birth, ID band Patient awake    Reviewed: Allergy & Precautions, H&P , NPO status , Patient's Chart, lab work & pertinent test results  Airway Mallampati: II  TM Distance: >3 FB Neck ROM: Full    Dental no notable dental hx. (+) Teeth Intact, Dental Advisory Given   Pulmonary asthma , sleep apnea , COPD   Pulmonary exam normal breath sounds clear to auscultation       Cardiovascular hypertension, Pt. on medications  Rhythm:Regular Rate:Normal     Neuro/Psych negative neurological ROS  negative psych ROS   GI/Hepatic negative GI ROS, Neg liver ROS,,,  Endo/Other  diabetes, Type 2  Class 3 obesity  Renal/GU negative Renal ROS  negative genitourinary   Musculoskeletal  (+) Arthritis , Osteoarthritis,    Abdominal   Peds  Hematology negative hematology ROS (+)   Anesthesia Other Findings   Reproductive/Obstetrics negative OB ROS                              Anesthesia Physical Anesthesia Plan  ASA: 3  Anesthesia Plan: General   Post-op Pain Management: Tylenol  PO (pre-op)*   Induction: Intravenous  PONV Risk Score and Plan: 4 or greater and Ondansetron , Dexamethasone  and Treatment may vary due to age or medical condition  Airway Management Planned: Oral ETT  Additional Equipment:   Intra-op Plan:   Post-operative Plan: Extubation in OR  Informed Consent: I have reviewed the patients History and Physical, chart, labs and discussed the procedure including the risks, benefits and alternatives for the proposed anesthesia with the patient or authorized representative who has indicated his/her understanding and acceptance.     Dental advisory given  Plan Discussed with: CRNA  Anesthesia Plan Comments:          Anesthesia Quick Evaluation

## 2024-04-18 NOTE — Transfer of Care (Signed)
 Immediate Anesthesia Transfer of Care Note  Patient: Leah Hampton  Procedure(s) Performed: REPAIR, HERNIA, VENTRAL, ROBOT-ASSISTED. ATTEMPTED REPAIR, HERNIA, VENTRAL, OPEN  Patient Location: PACU  Anesthesia Type:General  Level of Consciousness: drowsy  Airway & Oxygen Therapy: Patient Spontanous Breathing and Patient connected to face mask oxygen  Post-op Assessment: Report given to RN and Post -op Vital signs reviewed and stable  Post vital signs: Reviewed and stable  Last Vitals:  Vitals Value Taken Time  BP 121/68 04/18/24 16:00  Temp 36.7 C 04/18/24 15:57  Pulse 86 04/18/24 16:00  Resp 14 04/18/24 16:00  SpO2 100 % 04/18/24 16:00    Last Pain:  Vitals:   04/18/24 1600  TempSrc:   PainSc: Asleep         Complications: No notable events documented.

## 2024-04-18 NOTE — Op Note (Signed)
 Leah Hampton ([id number])  Operative Report  Date 04/18/2024   PREOPERATIVE DIAGNOSIS: Incisional hernia  POSTOPERATIVE DIAGNOSIS: Complex incisional hernia (8cm x 6 cm based on intra-op measurement  SURGEON: Cordella Idler, MD  SURGICAL STAFF: Powell Seats, RNFA  ANESTHESIA: General   PROCEDURE:  Diagnostic laparoscopy 2. Exploratory Laparotomy. 3. Lysis of Adhesions ~73min in Length 4. Open Muscular release of the left rectus muscle to facilitate retromuscular hernia repair. 5. Open Muscular release of the right rectus muscle to facilitate retromuscular hernia repair. 6. Open Ventral Robotic Rives-Stoppa Hernia Repair with Bard Soft Mesh 15 x 11 cm mesh.  INTRAOPERATIVE FINDINGS: She had very narrow rectus muscles that limited robotic operating space. There was colon and omentum incarcerated within the hernia. I attempted to gently reduce the hernia contents, but to avoid injury the decision was made to convert to an open repair Incisional hernia with a fascial defect of 8 cm (craniocaudal) x 6 cm (transverse); total surface area for adjacent tissue transfer: 144 square centimeters.  HERNIA CHARACTERISTICS: Location: incisional Approach: Open Initial/Recurrent: Initial Reducible/Incarcerated: incarcerated Mesh Implantation: Yes - 15x11cm Bard Soft Mesh Hernia Size: 8 x 6cm  ESTIMATED BLOOD LOSS: 75mL  COMPLICATIONS: None  SPECIMENS: Hernia sac  OPERATIVE INDICATIONS: Pt is a 68 y.o. female who presents with a complex ventral hernia.  The patient desires definitive repair.  The risks, benefits and alternatives of the procedure were explained to the patient and the patient agreed to proceed and signed informed consent in front of a witness.   DESCRIPTION OF PROCEDURE: After preoperative identifying the patient in holding, the patient was brought to the operating room and placed supine on the operating room table. SCDs were placed as indicated. After induction of  general anesthesia,  appropriate perioperative antibiotics were administered.  The operative site was prepped and draped in the typical sterile fashion.  A JACHO approved time out, where the name of the patient, the operation, and intended site was confirmed.  The procedure was begun.    The bilateral rectus muscles were marked as well as the linea alba. I could palpate a relatively large amount of herniated contents that were not reducible after induction. I gained access to the retrorectus space on the left using an optiview 5mm trocar. I began the retromuscular release and was able to stay within the appropriate space while placing 3 8mm robotic trocars on the left. I again attempted to reduce the hernia with manual pressure but was unable to do so. I placed 5mm trocar intra-abdominal in the RUQ using an optiview technique. I introduced a laparoscope to examine the hernia.  I noted a relatively large volume of herniated contents, including colon, omentum, and possibly small bowel. I was unable to reduce the hernia laparoscopically. I decided to dock the robot to attempt a minimally invasive hernia repair.  I was able to further develop the retrorectus space on the left. I identified the linea and was able to cross over to the right retrorectus space superior the hernia. As I moved caudad I encountered the hernia. I attempted gentle traction and selective sharp dissection to reduce the hernia. I made some progress but ultimately was unable to get the hernia reduced. I was afraid that further attempts with the robot could result in damage to the bowel. Therefore, I made the decision to convert to an open operation.   The skin was incised sharply and dissection was taken down through the subcutaneous tissues utilizing sharp dissection and electrocautery until the hernia  sac and fascia were encountered.  The abdomen was entered sharply and care was taken to not damage any of the underlying viscera.  An extensive  lysis of adhesions was undertaken utilizing sharp dissection, electrocautery, and blunt dissection to facilitate hernia repair.  This took approximately 45 minutes in length.  Dissection was brought to the hernia which revealed evidence of 8 cm x 6 cm herniation superior to the umbilicus.  This was reduced with care and dissection was taken inferiorly to the level of the pubis.  Once this was complete, attention was turned to the retromuscular space.    We turned our attention to creation of the right sided adjacent tissue transfer via posterior rectus advancement flap to facilitate primary fascial closure via a Rives-Stoppa approach.  A moist radiopaque towel was used to gently pack away and protect the bowel.  The linea alba was retracted anteriorly and a right 10mm incision was made in the posterior rectus sheath about 10mm lateral to the fascial edge. The belly of the rectus muscle was identified and bluntly separated from the posterior rectus sheath out toward the linea semilunaris. This space was mobilized superiorly toward the costal margin. Inferiorly, the retrorectus space was mobilized toward the pubis taking care to preserve the inferior epigastric vessels and the lateral neurovascular bundles. We confirmed hemostasis. We then used two approximation clamps to medialize the linea alba and found we had about a 7cm release on this side, easily bringing the linea alba close to the midline without tension. The left side would not medialize enough toward the midline. We again confirmed hemostasis in the posterior rectus sheath.  To facilitate tension-free primary fascial closure, we then decided to creation of a left sided adjacent tissue transfer via posterior rectus advancement flap to facilitate primary fascial closure. This was done by mobilizing the retrorectus space. The linea alba was retracted anteriorly and a 10mm incision was made on the posterior rectus sheath about 10mm lateral to the fascial  edge. The belly of the rectus muscle was identified and bluntly separated from the posterior rectus sheath out to the linea semilunaris. The space was mobilized superiorly toward the costal margin.   Inferiorly, the retrorectus space was mobilized toward the pubis and out to the linea semilunaris, taking care to preserve the inferior epigastric vessels and the neurovascular bundles.  We confirmed hemostasis. We then used two approximation clamps to medialize the linea alba and found we had a 7cm release also on this side, bringing the linea alba to midline without tension. At this point, both sides would medialize well for tension-free primary fascial closure.  We confirmed hemostasis in these planes. We confirmed all sponge, needle, and instrument counts were reported correct. The resulting posterior sheath defect, after bilateral posterior rectus myofascial releases, measured 6 cm x 15 cm.  This defect was closed using running 2-0 Stratafix Spiral suture.  Before final closure, the radiopaque towel was removed. This excluded the bowel.   We then turned our attention to mesh placement. We confirmed hemostasis in the dissection planes. We brought a piece of Bard Soft 15 cm x 11 cm mesh into the retrorectus and preperitoneal spaces placed it flat ensuring no buckling.  At this point, the linea alba would medialize without any tension.The linea alba was closed using #1 Stratafix Symmetric suture superiorly and inferiorly. We confirmed hemostasis in the subcutaneous space. We then closed a deep subcutaneous layer using running 3-0 Vicryl suture. Skin closure was performed using running interrupted 3-0 Vicryl  Suture and 4-0 Monocryl subcuticular suture.  An abdominal binder was placed.  The patient tolerated the procedure well and was extubated and taken to recovery room in good condition.  After confirming twice that the sponge, needle and instrument counts were correct, the procedure was terminated.  The patient  was extubated and transferred to the recovery room in stable condition   Electronically Signed By: [name] 04/18/2024 3:39 PM

## 2024-04-19 ENCOUNTER — Encounter (HOSPITAL_COMMUNITY): Payer: Self-pay | Admitting: General Surgery

## 2024-04-19 LAB — COMPREHENSIVE METABOLIC PANEL WITH GFR
ALT: 21 U/L (ref 0–44)
AST: 32 U/L (ref 15–41)
Albumin: 2.8 g/dL — ABNORMAL LOW (ref 3.5–5.0)
Alkaline Phosphatase: 50 U/L (ref 38–126)
Anion gap: 8 (ref 5–15)
BUN: 15 mg/dL (ref 8–23)
CO2: 24 mmol/L (ref 22–32)
Calcium: 8.9 mg/dL (ref 8.9–10.3)
Chloride: 108 mmol/L (ref 98–111)
Creatinine, Ser: 0.98 mg/dL (ref 0.44–1.00)
GFR, Estimated: >60 mL/min (ref >60)
Glucose, Bld: 166 mg/dL — ABNORMAL HIGH (ref 70–99)
Potassium: 4.5 mmol/L (ref 3.5–5.1)
Sodium: 140 mmol/L (ref 135–145)
Total Bilirubin: 0.3 mg/dL (ref 0.0–1.2)
Total Protein: 5.8 g/dL — ABNORMAL LOW (ref 6.5–8.1)

## 2024-04-19 LAB — GLUCOSE, CAPILLARY
Glucose-Capillary: 95 mg/dL (ref 70–99)
Glucose-Capillary: 112 mg/dL — ABNORMAL HIGH (ref 70–99)
Glucose-Capillary: 113 mg/dL — ABNORMAL HIGH (ref 70–99)
Glucose-Capillary: 113 mg/dL — ABNORMAL HIGH (ref 70–99)

## 2024-04-19 MED ORDER — INSULIN ASPART 100 UNIT/ML IJ SOLN: 0.0000 [IU] | Freq: Three times a day (TID) | INTRAMUSCULAR | Status: DC

## 2024-04-19 MED ORDER — INSULIN ASPART 100 UNIT/ML IJ SOLN: 0.0000 [IU] | Freq: Every day | INTRAMUSCULAR | Status: DC

## 2024-04-19 NOTE — Care Management Obs Status (Signed)
 MEDICARE OBSERVATION STATUS NOTIFICATION   Patient Details  Name: SEERAT PEADEN MRN: [id number] Date of Birth: 1955/12/28   Medicare Observation Status Notification Given:  Yes    Stephane Powell Jansky, RN 04/19/2024, 3:04 PM

## 2024-04-19 NOTE — Care Management CC44 (Signed)
 Condition Code 44 Documentation Completed  Patient Details  Name: EXILDA WILHITE MRN: [id number] Date of Birth: 05-26-1955   Condition Code 44 given:  Yes Patient signature on Condition Code 44 notice:  Yes Documentation of 2 MD's agreement:  Yes Code 44 added to claim:  Yes    Stephane Powell Jansky, RN 04/19/2024, 3:04 PM

## 2024-04-19 NOTE — TOC Initial Note (Signed)
 Transition of Care [facility]) - Initial/Assessment Note    Patient Details  Name: Leah Hampton MRN: [id number] Date of Birth: November 24, 1955  Transition of Care [facility] CM/SW Contact:    Jeoffrey LITTIE Maranda ISRAEL Phone Number: 04/19/2024, 10:31 AM  Clinical Narrative:                 Pt admitted from home for hernia repair. No current TOC needs ,please consult as needs arise.        Patient Goals and CMS Choice            Expected Discharge Plan and Services                                              Prior Living Arrangements/Services                       Activities of Daily Living   ADL Screening (condition at time of admission) Independently performs ADLs?: Yes (appropriate for developmental age) Is the patient deaf or have difficulty hearing?: No Does the patient have difficulty seeing, even when wearing glasses/contacts?: No Does the patient have difficulty concentrating, remembering, or making decisions?: No  Permission Sought/Granted                  Emotional Assessment              Admission diagnosis:  Incisional hernia [K43.2] Patient Active Problem List   Diagnosis Date Noted   Incisional hernia 04/18/2024   Class 2 severe obesity with serious comorbidity and body mass index (BMI) of 35.0 to 35.9 in adult 12/23/2023   Status post total right knee replacement 08/26/2023   Lactose intolerance 10/22/2022   S/P gastric sleeve procedure 11/04/2020   Uncomplicated asthma 11/04/2020   Primary osteoarthritis of both hips 09/30/2020   Endometrial polyp 07/15/2020   Low grade squamous intraepithelial lesion (LGSIL) on cervical Pap smear 07/07/2020   Post-menopausal bleeding 05/16/2019   Greater trochanteric pain syndrome of left lower extremity 03/20/2019   Type 2 diabetes mellitus without complication, without long-term current use of insulin  (HCC) 05/18/2018   Vitamin D  deficiency 02/09/2018   Onychomycosis 09/19/2017    Obstructive chronic bronchitis with exacerbation (HCC) 05/23/2017   Mixed hyperlipidemia 05/01/2014   Genital herpes 05/23/2012   OSA (obstructive sleep apnea) 12/10/2010   Severe obesity (BMI >= 40) (HCC) 07/31/2008   Essential hypertension 07/03/2008   PCP:  Daryl Setter, NP Pharmacy:   [facility] [phone] [place], [place] - [address] AT [facility] [address] [place] [address] Phone: [phone] Fax: [phone]  [facility] [place] [address] [place] [address] Phone: [phone] Fax: [phone]  [facility] - [place], [place] - [address] [place] [address] Phone: [phone] Fax: [phone]     Social Drivers of Health (SDOH) Social History: SDOH Screenings   Food Insecurity: No Food Insecurity (04/18/2024)  Housing: Low Risk  (04/18/2024)  Transportation Needs: No Transportation Needs (04/18/2024)  Utilities: Not At Risk (04/18/2024)  Alcohol Screen: Low Risk  (12/06/2023)  Depression (PHQ2-9): Low Risk  (12/23/2023)  Financial Resource Strain: Low Risk  (12/05/2023)  Physical Activity: Insufficiently Active (12/05/2023)  Social Connections: Moderately Integrated (04/18/2024)  Stress: No Stress  Concern Present (12/05/2023)  Tobacco Use: Low Risk  (04/18/2024)  Health Literacy: Adequate Health Literacy (12/06/2023)   SDOH Interventions:     Readmission Risk Interventions     No data to display

## 2024-04-19 NOTE — Progress Notes (Signed)
    1 Day Post-Op  Subjective: Sitting in the bedside chair. Reports minimal nausea but otherwise denies complaints. Pain is well controlled.   ROS: See above, otherwise other systems negative  Objective: Vital signs in last 24 hours: Temp:  [97.5 F (36.4 C)-98.5 F (36.9 C)] 98 F (36.7 C) (12/04 0515) Pulse Rate:  [85-94] 94 (12/04 0515) Resp:  [11-18] 18 (12/04 0515) BP: (115-148)/(63-97) 135/75 (12/04 0515) SpO2:  [93 %-100 %] 100 % (12/04 0515) Weight:  [93 kg] 93 kg (12/03 1001)    Intake/Output from previous day: 12/03 0701 - 12/04 0700 In: 2826.9 [I.V.:2576.9; IV Piggyback:250] Out: 700 [Urine:600; Blood:100] Intake/Output this shift: No intake/output data recorded.  PE: Gen: female, NAD Abd: soft, non-distended, incision and port sites clean and dry without erythema or ecchymosis  Lab Results:  Recent Labs    04/18/24 1906  WBC 11.0*  HGB 13.5  HCT 42.2  PLT 186   BMET Recent Labs    04/18/24 1906 04/19/24 0218  NA  --  140  K  --  4.5  CL  --  108  CO2  --  24  GLUCOSE  --  166*  BUN  --  15  CREATININE 0.79 0.98  CALCIUM  --  8.9   PT/INR No results for input(s): LABPROT, INR in the last 72 hours. CMP     Component Value Date/Time   NA 140 04/19/2024 0218   NA 142 05/23/2017 1142   K 4.5 04/19/2024 0218   CL 108 04/19/2024 0218   CO2 24 04/19/2024 0218   GLUCOSE 166 (H) 04/19/2024 0218   BUN 15 04/19/2024 0218   BUN 14 05/23/2017 1142   CREATININE 0.98 04/19/2024 0218   CREATININE 0.90 03/14/2020 1128   CALCIUM 8.9 04/19/2024 0218   PROT 5.8 (L) 04/19/2024 0218   ALBUMIN  2.8 (L) 04/19/2024 0218   AST 32 04/19/2024 0218   ALT 21 04/19/2024 0218   ALKPHOS 50 04/19/2024 0218   BILITOT 0.3 04/19/2024 0218   GFRNONAA >60 04/19/2024 0218   GFRNONAA 74 03/22/2016 1631   GFRAA >60 08/23/2019 1106   GFRAA 85 03/22/2016 1631   Lipase     Component Value Date/Time   LIPASE 18 08/23/2019 1106    Studies/Results: No results  found.  Anti-infectives: Anti-infectives (From admission, onward)    Start     Dose/Rate Route Frequency Ordered Stop   04/18/24 1000  ceFAZolin  (ANCEF ) IVPB 2g/100 mL premix        2 g 200 mL/hr over 30 Minutes Intravenous On call to O.R. 04/18/24 0949 04/18/24 1137       Assessment/Plan 68 y/o F POD 1 from robotic converted to open retrorectus hernia repair  - Will stop MIVF - SSI - Continue home meds - OOB and ambulating in the hall today - Regular diet - Will plan for possible discharge tomorrow, but if she does great this morning and wants to leave this afternoon it would be reasonable    LOS: 1 day   Cordella DELENA Polly Mar[facility] 04/19/2024, 8:27 AM Please see Amion for pager number during day hours 7:00am-4:30pm or 7:00am -11:30am on weekends

## 2024-04-19 NOTE — Anesthesia Postprocedure Evaluation (Signed)
 Anesthesia Post Note  Patient: Leah Hampton  Procedure(s) Performed: REPAIR, HERNIA, VENTRAL, ROBOT-ASSISTED. ATTEMPTED REPAIR, HERNIA, VENTRAL, OPEN     Patient location during evaluation: PACU Anesthesia Type: General Level of consciousness: awake and alert Pain management: pain level controlled Vital Signs Assessment: post-procedure vital signs reviewed and stable Respiratory status: spontaneous breathing, nonlabored ventilation and respiratory function stable Cardiovascular status: stable and blood pressure returned to baseline Anesthetic complications: no   No notable events documented.  Last Vitals:  Vitals:   04/19/24 0515 04/19/24 0935  BP: 135/75 121/68  Pulse: 94 85  Resp: 18 18  Temp: 36.7 C 36.4 C  SpO2: 100% 94%    Last Pain:  Vitals:   04/19/24 0935  TempSrc: Oral  PainSc:                  [name] Like

## 2024-04-19 NOTE — Plan of Care (Signed)

## 2024-04-20 ENCOUNTER — Other Ambulatory Visit (HOSPITAL_COMMUNITY): Payer: Self-pay

## 2024-04-20 LAB — SURGICAL PATHOLOGY

## 2024-04-20 LAB — GLUCOSE, CAPILLARY: Glucose-Capillary: 79 mg/dL (ref 70–99)

## 2024-04-20 MED ORDER — IBUPROFEN 200 MG PO TABS
600.0000 mg | ORAL_TABLET | Freq: Four times a day (QID) | ORAL | 0 refills | Status: AC
  Filled 2024-04-20: qty 75, 7d supply, fill #0

## 2024-04-20 MED ORDER — OXYCODONE HCL 5 MG PO TABS
5.0000 mg | ORAL_TABLET | Freq: Three times a day (TID) | ORAL | 0 refills | Status: AC | PRN
  Filled 2024-04-20: qty 12, 4d supply, fill #0

## 2024-04-20 MED ORDER — ACETAMINOPHEN 325 MG PO TABS
650.0000 mg | ORAL_TABLET | Freq: Four times a day (QID) | ORAL | 0 refills | Status: AC
  Filled 2024-04-20: qty 50, 7d supply, fill #0

## 2024-04-20 NOTE — Discharge Summary (Signed)
 Physician Discharge Summary  Patient ID: Leah Hampton MRN: [id number] DOB/AGE: Jan 19, 1956 68 y.o.  Admit date: 04/18/2024 Discharge date: 04/20/2024  Admission Diagnoses:  Discharge Diagnoses:  Principal Problem:   Incisional hernia   Discharged Condition: stable  Hospital Course: 68 y/o F with an incisional hernia who presented to elective repair. She was taken to the OR for a robotic converted to open retrorectus incisional hernia repair. She was admitted for post-operative management. Her course was uncomplicated. She was discharged to home on POD 2. At the time of discharge her pain was well controlled, she was tolerating PO, she was ambulating without assistance, and her abdominal exam was reassuring. She will follow up in the office.   Discharge Exam: Blood pressure (!) 114/57, pulse 92, temperature 97.7 F (36.5 C), temperature source Oral, resp. rate 16, height 5' 3 (1.6 m), weight 93 kg, last menstrual period 11/10/2011, SpO2 100%. Gen: female, NAD Abd: soft, non-distended, midline incision and ports clean and dry with dermabond intact, no erythema, no drainage, no fluctuance, no ecchymosis, no sign of hematoma  Disposition: Discharge disposition: 01-Home or Self Care        Allergies as of 04/20/2024       Reactions   Sulfamethoxazole -trimethoprim [bactrim] Itching   Voltaren  [diclofenac ] Diarrhea   Codeine Nausea And Vomiting, Anxiety        Medication List     STOP taking these medications    amoxicillin  500 MG capsule Commonly known as: AMOXIL        TAKE these medications    acetaminophen  325 MG tablet Commonly known as: Tylenol  Take 2 tablets (650 mg total) by mouth every 6 (six) hours for 6 days.   albuterol  108 (90 Base) MCG/ACT inhaler Commonly known as: VENTOLIN  HFA Inhale 2 puffs into the lungs every 6 (six) hours as needed for wheezing or shortness of breath.   amLODipine  5 MG tablet Commonly known as: NORVASC  Take 1 tablet (5 mg  total) by mouth daily.   BARIATRIC MULTIVITAMINS PO Take 1 capsule by mouth in the morning.   diclofenac  Sodium 1 % Gel Commonly known as: VOLTAREN  Apply 2 g topically daily as needed (pain).   FT Ibuprofen  200 MG tablet Generic drug: ibuprofen  Take 3 tablets (600 mg total) by mouth every 6 (six) hours for 6 days.   losartan  100 MG tablet Commonly known as: COZAAR  Take 1 tablet (100 mg total) by mouth daily.   Mounjaro  10 MG/0.5ML Pen Generic drug: tirzepatide  INJECT 10MG  UNDER THE SKIN ONCE A WEEK   oxyCODONE  5 MG immediate release tablet Commonly known as: Roxicodone  Take 1 tablet (5 mg total) by mouth every 8 (eight) hours as needed for up to 4 days. What changed:  when to take this reasons to take this   pravastatin  80 MG tablet Commonly known as: PRAVACHOL  Take 1 tablet (80 mg total) by mouth daily. What changed:  when to take this additional instructions   Qvar  RediHaler 80 MCG/ACT inhaler Generic drug: beclomethasone USE 1 INHALATION BY MOUTH INTO THE LUNGS TWICE DAILY   valACYclovir  1000 MG tablet Commonly known as: VALTREX  Take 1 tablet (1,000 mg total) by mouth daily. What changed: how much to take   Viactiv Calcium Plus D 650-12.5-40 MG-MCG Chew Generic drug: Calcium-Vitamin D -Vitamin K Chew 1 tablet by mouth in the morning, at noon, and at bedtime.         Signed: [name] 04/20/2024, 2:34 PM

## 2024-04-20 NOTE — Progress Notes (Signed)
 DISCHARGE NOTE HOME Leah Hampton to be discharged Home per MD order. Discussed prescriptions and follow up appointments with the patient. Prescriptions given to patient; medication list explained in detail. Patient verbalized understanding.  Skin clean, dry and intact without evidence of skin break down, no evidence of skin tears noted. IV catheter discontinued intact. Site without signs and symptoms of complications. Dressing and pressure applied. Pt denies pain at the site currently. No complaints noted.  Patient free of lines, drains, and wounds.   An After Visit Summary (AVS) was printed and given to the patient. Patient escorted via wheelchair, to discharge lounge to wait for ride and will be discharged home via private auto.  [name], RN

## 2024-04-20 NOTE — Plan of Care (Signed)
   Problem: Education: Goal: Knowledge of General Education information will improve Description: Including pain rating scale, medication(s)/side effects and non-pharmacologic comfort measures Outcome: Progressing   Problem: Health Behavior/Discharge Planning: Goal: Ability to manage health-related needs will improve Outcome: Progressing   Problem: Activity: Goal: Risk for activity intolerance will decrease Outcome: Progressing   Problem: Coping: Goal: Level of anxiety will decrease Outcome: Progressing   Problem: Pain Managment: Goal: General experience of comfort will improve and/or be controlled Outcome: Progressing

## 2024-04-20 NOTE — Discharge Instructions (Signed)
 Home Care After Hernia Repair   Activity  Returning to normal daily activities as soon as you can following surgery will enhance recovery time.  No heavy lifting pushing or pulling, anything heavier than 10 pounds (gallon of milk weighs approx. 8.8 pounds) for 4-6 weeks from surgery date.  Do not mow the lawn, use a vacuum cleaner, or do any other strenuous activities without first consulting your surgical team.  Climb stairs slowly and watch your step.  Walk as often as you feel able to increase strength and endurance.  No driving or operating heavy machinery within 24 hours of taking narcotic pain medication.  Diet  Drink plenty of fluids and eat a light meal on the night of surgery. Some patients may find their appetite is poor for a week or two after surgery. This is a normal result of the stress of surgery-your appetite will return in time.   There are no specific diet restrictions after surgery.  Dressings and Wound Care  Dermabond/Durabond (skin glue): This will usually remain in place for 10-14 days, then naturally fall off your skin. You may take a shower 24 hrs after  surgery, carefully wash, not scrub the incision site with a mild non-scented soap. Pat dry with a soft towel.  Do not pick or peel skin glue off.  You can shower and let the water  fall on the dressings above. Do not soak or submerge your incision(s) in a bath tub, hot tub, or swimming pool, until your doctor says it is ok to do so or the incision(s) have completely healed, usually about 2-4 weeks.  Do not use creams, powder, salves or balms on your incision(s).  What to Expect After Surgery   Moderate discomfort controlled with medications  Minimal drainage from incision  You may feel pain in one or both shoulders. This pain comes from the gas still left in your belly after the surgery, if you had laparoscopic surgery (several small incisions). The pain should ease over several days to a week. Ambulation will help  with this pain.   Belly swelling  Feeling fatigue and weak  Constipation after surgery is common. Drink plenty fluids and eat a high fiber diet.  Swelling - In some patients might feel that their hernia has returned after surgery-DO NOT Worry this is normal. Swelling may be due to the development of a seroma. Seroma is fluid that has built up where the hernia was repaired this is a normal result after surgery and it will slowly reabsorb back into your body over the next several weeks.   Pain Control: Prescribed Non-Narcotic Pain Medication  You will be given three prescriptions.  Two of them will be for prescription strength ibuprofen  (i.e. Advil ) and prescription strength acetaminophen  (i.e. Tylenol ).  The vast majority of patients will just need these two medications.  One prescription will be for a 'rescue' prescription of an oral narcotic (oxycodone ).  You may fill this if needed.  You will alternate taking the ibuprofen  (600mg ) every 6 hours and also the acetaminophen  (650mg ) every 6 hours so that you are taking one of those medications every 3 hours.  For example: o 0800 - take ibuprofen  600mg  o 1100 - take acetaminophen  650mg  o 1400 - take ibuprofen  600mg  o 1700 - take acetaminophen  650mg  o Etc.  Continue taking this alternating pattern of ibuprofen  and acetaminophen  for 3 days  If you cannot take one or the other of these medications, just take the one you can every 6  hours.  If you are comfortable at night, you don't have to wake up and take a medication.  If you are still uncomfortable after taking either ibuprofen  or acetaminophen , try gentle stretching exercise and ice packs (a bag of frozen vegetables works great).  If you are still uncomfortable, you may fill the narcotic prescription of Oxycodone  and take as directed.  Once you have completed these prescriptions, your pain level should be low enough to stop taking medications altogether or just use an over the counter medication  (ibuprofen  or acetaminophen ) as needed.   Pain Control: Over the Counter Medications to take as needed:  Colace/Docusate: May be prescribed by your surgeon to prevent constipation caused by the combination of narcotics, effects of anesthesia, and decreased ambulation.  Hold for loose stools or diarrhea. Take 100 mg 1-2 times a day starting tonight.   Fiber: High fiber foods, extra liquids (water  9-13 cups/day) can also assist with constipation. Examples of high fiber foods are fruit, bran. Prune juice and water  are also good liquids to drink.  Milk of Magnesia/Miralax :  If constipated despite take the over the counter stool softeners, you may take Milk of Magnesia or Miralax  as directed on bottle to assist with constipation.     Pepcid /Famotidine : May be prescribed while taking naproxen  (Aleve ) or other NSAIDs such as ibuprofen  (Motrin /Advil ) to prevent stomach upset or Acid-reflux symptoms. Take 1 tablet 1-2 times a day.   **Constipation: The first bowel movement may occur anywhere between 1-5 days after surgery.  As long as you are not nauseated or not having significant abdominal pain this variation is acceptable.  Narcotic pain medications can cause constipation increasing discomfort; early discontinuation will assist with bowel management.If constipated despite taking stool softeners, you may take Milk of Magnesia or Miralax  as directed on the bottle.     **Home medications: You may restart your home medications as directed by your respective Primary Care Physician or Surgeon.  When to notify your Doctor or Healthcare Team:   Sign of Wound Infection   Fever over 100 degrees.  Wound becomes extremely swollen, shows red streaks, warm to the touch, and/or drainage from the incision site or foul-smelling drainage.  Wound edges separate or opens up  Bleeding or bruising   If you have bleeding, apply pressure to the site and hold the pressure firmly for 5 minutes. If the bleeding continues, apply  pressure again and call 911. If the bleeding stopped, call your doctor to report it.   Call your doctor or nurse if you have increased bleeding from your site and increased bruising or a lump forms or gets larger under your skin at the site.  Unrelieved Pain    Call your doctor or nurse if your pain gets worse or is not eased 1 hour after taking your pain medicine, or if it is severe and uncontrolled.  Nausea and Vomiting   Call your doctor or nurse if you have nausea and vomiting that continues more than 24 hours, will not let you keep medicine down and will not let you keep fluids down  Fever, Flu-like symptoms   Fever over 100 degrees and/or chills  Gastrointestinal Bleeding Symptoms    Black tarry bowel movements.  This can be normal after surgery on the stomach, but should resolve in a day or two.    Call 911 if you suddenly have signs of blood loss such as:  Vomiting blood  Fast heart rate  Feeling faint, sweaty, or blacking out  Passing bright red blood from your rectum  Blood Clot Symptoms   Tender, swollen or reddened areas in your calf muscle or thighs.  Numbness or tingling in your lower leg or calf, or at the top of your leg or groin  Skin on your leg looks pale or blue or feels cold to touch  Chest pain or have trouble breathing, lightheadedness, fast heart rate  Sudden Onset of Symptoms    Call 911 if you suddenly have:  Leg weakness and spasm  Loss of bladder or bowel function  Seizure  Confusion, severe headache, dizziness or feeling unsteady, problems talking, difficulty swallowing, and/or numbness or muscle weakness as these could be signs of a stroke.   Follow up Appointment Your follow up appointment should be scheduled 2-3 weeks after your surgery date.  If you have not previously scheduled for a follow-up visit you can be scheduled by contacting [phone].

## 2024-05-02 ENCOUNTER — Telehealth: Payer: Self-pay | Admitting: Family

## 2024-05-02 ENCOUNTER — Other Ambulatory Visit: Payer: Self-pay

## 2024-05-02 MED ORDER — AMLODIPINE BESYLATE 5 MG PO TABS: 5.0000 mg | ORAL_TABLET | Freq: Every day | ORAL | 2 refills | Status: AC

## 2024-05-02 MED ORDER — PRAVASTATIN SODIUM 80 MG PO TABS: 80.0000 mg | ORAL_TABLET | Freq: Every day | ORAL | 2 refills | Status: AC

## 2024-05-02 NOTE — Telephone Encounter (Signed)
 Had 2 current prescriptions with [facility], prescriptions changed to [facility].

## 2024-05-02 NOTE — Telephone Encounter (Signed)
 Copied from CRM #[id number]. Topic: Clinical - Medication Question >> May 02, 2024  8:58 AM Emylou G wrote: Reason for CRM: Patient called.. wants to change her scripts to go from [facility] to [facility]

## 2024-05-16 ENCOUNTER — Other Ambulatory Visit: Payer: Self-pay | Admitting: Family

## 2024-05-18 ENCOUNTER — Ambulatory Visit: Admitting: Podiatry

## 2024-05-18 DIAGNOSIS — M778 Other enthesopathies, not elsewhere classified: Secondary | ICD-10-CM | POA: Diagnosis not present

## 2024-05-18 DIAGNOSIS — M25374 Other instability, right foot: Secondary | ICD-10-CM

## 2024-05-18 NOTE — Patient Instructions (Signed)
For instructions on how to put on your Tri-Lock Ankle Brace, please visit [email or web address] 

## 2024-05-18 NOTE — Progress Notes (Signed)
 Subjective: Chief Complaint  Patient presents with   Plantar Fasciitis    RM 11 Patient is here for capsulitis of the right foot. Pt states some continued pain in right foot. Pt requested additional instructions on how to use Trilock brace.     69 year old female presents the office today with above concerns.  States that she is doing better and she is not having much pain.  She is not able with a Tri-Lock brace that she was not  sure of how to put it on.  No recent injuries or changes otherwise.  S   Objective: AAO x3, NAD DP/PT pulses palpable bilaterally, CRT less than 3 seconds They have not able to appreciate any area of pinpoint tenderness or tenderness on the dorsal aspect the foot on the Lisfranc joint area.  There is no significant pain to the ankle itself.  There is no erythema or warmth.  There is no area of pinpoint tenderness.   No pain with calf compression, swelling, warmth, erythema  Assessment: Capsulitis, tendinitis  Plan: -All treatment options discussed with the patient including all alternatives, risks, complications.  - As described not have any pain would not hold up any kind of injection.  We showed her how to put the Tri-Lock ankle brace on.  I do think it would be good for her as she starts to increase her activity, treadmill usage.  Continue shoes, good arch support.  Return if symptoms worsen or fail to improve.  Donnice JONELLE Fees DPM

## 2024-06-18 ENCOUNTER — Other Ambulatory Visit: Payer: Self-pay | Admitting: Family

## 2024-06-27 ENCOUNTER — Ambulatory Visit: Admitting: Family

## 2024-09-06 ENCOUNTER — Ambulatory Visit: Admitting: Orthopaedic Surgery

## 2024-12-06 ENCOUNTER — Ambulatory Visit
